# Patient Record
Sex: Male | Born: 1935 | Race: White | Hispanic: No | Marital: Married | Smoking: Never smoker
Health system: Southern US, Community
[De-identification: ages and names within clinical notes are randomized; demographics above are authoritative.]

## PROBLEM LIST (undated history)

## (undated) DIAGNOSIS — I639 Cerebral infarction, unspecified: Secondary | ICD-10-CM

## (undated) DIAGNOSIS — E785 Hyperlipidemia, unspecified: Secondary | ICD-10-CM

## (undated) DIAGNOSIS — Z952 Presence of prosthetic heart valve: Secondary | ICD-10-CM

## (undated) DIAGNOSIS — G20 Parkinson's disease: Secondary | ICD-10-CM

## (undated) DIAGNOSIS — G20A1 Parkinson's disease without dyskinesia, without mention of fluctuations: Secondary | ICD-10-CM

## (undated) HISTORY — PX: CATARACT EXTRACTION: SUR2

## (undated) HISTORY — DX: Hyperlipidemia, unspecified: E78.5

## (undated) HISTORY — DX: Parkinson's disease: G20

## (undated) HISTORY — DX: Cerebral infarction, unspecified: I63.9

## (undated) HISTORY — DX: Presence of prosthetic heart valve: Z95.2

## (undated) HISTORY — DX: Parkinson's disease without dyskinesia, without mention of fluctuations: G20.A1

---

## 2002-03-01 ENCOUNTER — Ambulatory Visit (HOSPITAL_COMMUNITY): Admission: RE | Admit: 2002-03-01 | Discharge: 2002-03-01 | Payer: Self-pay | Admitting: Gastroenterology

## 2007-03-26 ENCOUNTER — Inpatient Hospital Stay (HOSPITAL_COMMUNITY): Admission: EM | Admit: 2007-03-26 | Discharge: 2007-04-03 | Payer: Self-pay | Admitting: Emergency Medicine

## 2007-03-27 ENCOUNTER — Encounter (INDEPENDENT_AMBULATORY_CARE_PROVIDER_SITE_OTHER): Payer: Self-pay | Admitting: Cardiology

## 2007-03-28 ENCOUNTER — Ambulatory Visit: Payer: Self-pay | Admitting: Cardiothoracic Surgery

## 2007-03-28 ENCOUNTER — Encounter: Payer: Self-pay | Admitting: Vascular Surgery

## 2007-03-29 ENCOUNTER — Encounter (INDEPENDENT_AMBULATORY_CARE_PROVIDER_SITE_OTHER): Payer: Self-pay | Admitting: Cardiology

## 2007-04-10 ENCOUNTER — Encounter: Payer: Self-pay | Admitting: Cardiothoracic Surgery

## 2007-04-10 ENCOUNTER — Inpatient Hospital Stay (HOSPITAL_COMMUNITY): Admission: RE | Admit: 2007-04-10 | Discharge: 2007-04-16 | Payer: Self-pay | Admitting: Cardiothoracic Surgery

## 2007-04-10 HISTORY — PX: MITRAL VALVE ANNULOPLASTY: SHX2038

## 2007-05-11 ENCOUNTER — Ambulatory Visit: Payer: Self-pay | Admitting: Cardiothoracic Surgery

## 2007-05-11 ENCOUNTER — Encounter: Admission: RE | Admit: 2007-05-11 | Discharge: 2007-05-11 | Payer: Self-pay | Admitting: Cardiothoracic Surgery

## 2007-05-12 ENCOUNTER — Ambulatory Visit: Payer: Self-pay | Admitting: Cardiothoracic Surgery

## 2007-05-18 ENCOUNTER — Encounter (HOSPITAL_COMMUNITY): Admission: RE | Admit: 2007-05-18 | Discharge: 2007-08-16 | Payer: Self-pay | Admitting: Cardiology

## 2007-08-03 ENCOUNTER — Ambulatory Visit: Payer: Self-pay | Admitting: Cardiothoracic Surgery

## 2007-08-17 ENCOUNTER — Encounter (HOSPITAL_COMMUNITY): Admission: RE | Admit: 2007-08-17 | Discharge: 2007-11-15 | Payer: Self-pay | Admitting: Cardiology

## 2011-04-06 NOTE — Discharge Summary (Signed)
NAME:  [name] [name], [name] [name] NO.:  [id number]   MEDICAL RECORD NO.:  [id number]          PATIENT TYPE:  INP   LOCATION:  2035                         FACILITY:  [facility]   PHYSICIAN:  Nicki Guadalajara, M.D.     DATE OF BIRTH:  Feb 27, 1932   DATE OF ADMISSION:  03/26/2007  DATE OF DISCHARGE:  04/03/2007                               DISCHARGE SUMMARY   DISCHARGE DIAGNOSES:  1. Severe mitral regurgitation.  2. Mitral valve prolapse.  3. Parkinson's.  4. History of endocarditis.  5. Hypertension, controlled.  6. Hypotension with medications.  7. Moderate pulmonary hypertension.  8. No significant coronary artery disease.  There is marked ectasia      noted in the right coronary artery.  9. Needs 14 days off MAO inhibitor, Azilect, prior to mitral valve      replacement surgery.   DISCHARGE CONDITION:  Stable.   PROCEDURES:  1. Mar 27, 2007, combined left heart catheterization by Cristy Hilts. Jacinto Halim,      MD, and right heart catheterization.  See dictated report.  2. Mar 29, 2007, TEE done by Dr. Yates Decamp, with severe mitral valve      prolapse, PML tents to 7 mm beyond the annulus, involving severe      MVP involving P1, P2 and P3 segments, flail P2 segment, severe MR.   DISCHARGE MEDICATIONS:  1. Amiodarone 200 mg twice a day.  2. Requip 2 mg at bedtime and 3 mg at 7, 12 and 7 p.m.  3. Aspirin 81 mg daily.  4. Sinemet 25/100 mg one at 7 a.m. and at noon.  5. Lipitor has been held due to abnormal LFTs.  6. Stop taking Azilect, Provigil and Celebrex as well as Lipitor.  7. The patient was attempted to be given metoprolol and lisinopril      during hospitalization but due to hypotension with blood pressures      in the 80s and heart rates in the 40s, these were both stopped.   DISCHARGE INSTRUCTIONS:  1. Increase activity slowly only to what you were doing in the      hospital.  You may walk up steps with assistance.  You may shower      and bathe.  No lifting.  No driving.   Do not do any more activity      at home than you have done in the hospital.  2. Wash right groin catheterization site with soap and water.  Call if      any bleeding, swelling or drainage.  3. Follow up with Dr. Jacinto Halim Apr 07, 2007, at 12 noon.  4. The patient was instructed that surgery was tentatively planned on      the 19th, 20th or 21st.  I have called Dr. Dennie Maizes office and      asked them to contact the patient at home for further instructions.   HISTORY OF PRESENT ILLNESS:  A 77 year old white married male without  previous history of coronary disease, who presented to the emergency  room at [facility] with complaints of chest burning like  indigestion sensation  in the middle of the chest.  He also felt diaphoretic.  The symptoms  continued on and off since March 22, 2007, especially with exertion.  On  the day of admission, Mar 26, 2007, he felt progressively worse and  decided to go to the ER for assessment, and he was seen and evaluated  and admitted to rule out MI.  A CT of the chest was done, concerned for  a history of endocarditis.   PAST MEDICAL HISTORY:  1. Parkinson disease.  2. Cervical spine osteoarthritis.  3. Bacterial endocarditis.  4. Hypotension.  5. Hyperlipidemia.   FAMILY HISTORY:  No significant direct family history of coronary  disease.   SOCIAL HISTORY:  Married.  Retired Clinical research associate.  No tobacco, occasional beer.  Physically active.   OUTPATIENT MEDICATIONS:  1. Requip 2 mg b.i.d., Requip 3 mg t.i.d.  2. Lipitor 20 mg daily.  3. Provigil 200 mg daily and p.r.n.  4. Azilect 1 mg daily.  5. Sinemet 25/100 mg b.i.d.  6. Celebrex 200 mg daily p.r.n.   ALLERGIES:  No known allergies.   REVIEW OF SYSTEMS:  See HPI.   PHYSICAL EXAMINATION AT DISCHARGE:  Blood pressure 112/72, pulse 60,  respiratory rate 20, temperature 97.6.  Heart:  No change in murmur.  Chest clear.  Right groin was stable.   LABORATORY DATA:  Hemoglobin 13.1, hematocrit 38.6, WBC  7.8, platelets  142.  Neutrophils 75, lymphs 15, monos 7, eos 1, baso 0.  D-dimer on  admission 2.17.  Pro time 16.1, INR 1.3.  heparin was started at 0.311.  Chemistry:  Sodium 135, potassium 3.8, chloride 105, CO2 25, glucose  103, BUN 22, creatinine 1.16, glucose was slightly elevated on day 5-  1/2.  Total protein 5.8, albumin 2.9, AST 56, ALT 62, ALP 91, total  bilirubin 0.7.  Magnesium 2.4.  CK-MB:  CKs were 52, MBs 3.3, troponin I  0.60, 0.41.  The last CK was 122, MB was 3.7, troponin I 0.20.  Total  cholesterol 129, triglycerides 28, HDL 44, and LDL 79.  TSH 3.806.  Blood culture was done:  No growth to date.   CT of the chest:  No CT evidence for pulmonary emboli.  Cardiac  enlargement with bilateral pleural effusions.  Some patchy mild edema  and bilateral atelectasis.  Enlarged mediastinal and hilar lymph nodes  may be inflammatory.  There are a few small pulmonary nodules in the  lower lung zones.  Recommend follow-up noncontrast chest CT in 6 months  to reevaluate.  Normal thoracic aorta.  No significant upper abdominal  findings.  EKG:  Sinus rhythm, sinus arrhythmia, and no acute changes.   HOSPITAL COURSE:  The patient was admitted by Dr. Tresa Endo and admitted for  rule-out MI.  Troponin I was mildly elevated.  The patient underwent  cardiac catheterization, which revealed significant coronary disease.  There was marked ectasia of the right coronary artery.  He was found to  have significant mitral regurgitation, mitral valve prolapse and  recommendation for mitral valve replacement was given.  He did undergo  TEE with results as stated.  He continued to improve and to be stable.  Amiodarone was started prophylactically prior to surgery.  We decreased  his Lopressor initially with that and his heart rate continued to drop  down at times to 44.  His Lopressor eventually had to be held and his pressure dropped to the 80s.  His lisinopril was also held, which had  been  started on admission.  With anesthesia evaluation, it was felt he  needed to come off the Azilect for 14 days prior to anesthesia.  At one  point the patient's oxygen level was somewhat low but at the time of  discharge, room air oxygen saturation with ambulation was 94% and at  rest it was 96%.  He was stable and ready for discharge.  He would  follow up with Dr. Jacinto Halim as well as follow the instructions from [facility].      Darcella Gasman. Annie Paras, N.P.    ______________________________  Nicki Guadalajara, M.D.    LRI/MEDQ  D:  04/03/2007  T:  04/03/2007  Job:  [id number]   cc:   Sheliah Plane, MD  Cristy Hilts Jacinto Halim, MD  Alfonse Alpers Dagoberto Ligas, M.D.  Dr. Annabell Sabal

## 2011-04-06 NOTE — Op Note (Signed)
NAME:  [name], [name] NO.:  [id number]   MEDICAL RECORD NO.:  [id number]          PATIENT TYPE:  INP   LOCATION:  2312                         FACILITY:  [facility]   PHYSICIAN:  Guadalupe Maple, M.D.  DATE OF BIRTH:  [birth year]   DATE OF PROCEDURE:  04/10/2007  DATE OF DISCHARGE:                               OPERATIVE REPORT   PROCEDURE:  Intraoperative transesophageal echocardiography.   HISTORY OF PRESENT ILLNESS:  [name] is a 77 year old white male  with a history of severe mitral regurgitation who was scheduled to  undergo mitral valve repair by Dr. Tyrone Sage.  Intraoperative  transesophageal echocardiography was requested to evaluate the mitral  valve and to determine if any other valvular pathology was present, and  to assess the left ventricular function.   The patient was brought to the operating room at [facility] and  general anesthesia was induced without difficulty.  The tracheal was  intubated without difficulty.   IMPRESSION:  Pre-bypass findings:  1. Mitral valve:  There was thickening and prolapse of the posterior      leaflet of the mitral valve with torn chordae.  This appeared to      involve the P2 and part of the P1 area of the mitral valve.  There      appeared to be no myxomatous degeneration or torn chordae involving      the anterior leaflet.  There was a jet of mitral regurgitation      which appeared to originate in the P2 and part of the P1 region.      The jet was directed centrally and anteriorly.  The regurgitant      orifice area by the PISA method measured 0.52 cm squared.      Regurgitant volume was 94 mL.  2. Aortic valve:  Aortic vale was trileaflet.  The leaflets open      normally.  There was no calcification of the valve leaflets      appreciated and there was trace aortic insufficiency.  3. Left ventricle:  The left ventricular cavity was dilated.  The left      ventricular diameter at end diastole of the  mid-papillary level was      6.65 cm and end systole 4.13 cm.  Left ventricular wall thickness      measured 1.05 cm at the mid-papillary level at end diastole of the      interatrial septum.  There was no thrombus noted in the left      ventricular apex.  4. Right ventricle:  The right ventricular size appeared to be within      normal limits with good contractility of the right ventricular free      wall.  5. Tricuspid valve:  The tricuspid valve was trileaflet with trace to      1+ tricuspid insufficiency.  6. Interatrial septum:  There was no evidence of a patent foreman      ovale or atrial septal defect by color Doppler and by bubble study.  7. Left atrium:  The  left atrial cavity was enlarged.  There was no      thrombus noted in the left atrium or the left atrial appendage.  8. Ascending aorta:  The ascending aorta showed no evidence of      significant atheromatous disease appreciated.  The ascending aorta      was not aneurysmal.   Post-bypass findings:  1. Mitral valve:  There was evidence of a mitral valvuloplasty done.      There was a valvuloplasty ring in place with a characteristic      trapdoor appearance of the mitral valve with the anterior leaflet      mobile and the posterior leaflet largely immobile.  There was trace      mitral insufficiency residually.  The mitral valve mean gradient by      continuous wave Doppler measured 2.8 mmHg.  2. Aortic valve:  The aortic valve was unchanged from the pre-valvular      study.  It was trileaflet, opening normally with trace aortic      insufficiency.  3. Left ventricle:  The left ventricular cavity was smaller in size      than the pre-bypass study.  Initially with ventricular pacing,      there was dyssynchronous motion of the left ventricular      contractility, but with atrial pacing this largely resolved and      there was good contractibility of the left ventricle with ejection      fraction estimated at 55 to  60%.  4. Right ventricle:  The right ventricular size again appeared to be      within normal limits with good contractility of the right      ventricular free wall.  5. Tricuspid valve:  There was residual trace tricuspid insufficiency.           ______________________________  Guadalupe Maple, M.D.     DCJ/MEDQ  D:  04/10/2007  T:  04/10/2007  Job:  [id number]

## 2011-04-06 NOTE — Discharge Summary (Signed)
NAME:  [name] [name], [name] [name] NO.:  [id number]   MEDICAL RECORD NO.:  [id number]          PATIENT TYPE:  INP   LOCATION:  2031                         FACILITY:  [facility]   PHYSICIAN:  Sheliah Plane, MD    DATE OF BIRTH:  1932-10-26   DATE OF ADMISSION:  04/10/2007  DATE OF DISCHARGE:  04/16/2007                               DISCHARGE SUMMARY   PRIMARY ADMITTING DIAGNOSIS:  Severe mitral regurgitation.   ADDITIONAL/DISCHARGE DIAGNOSES:  1. Severe mitral regurgitation.  2. History of congestive heart failure.  3. Hyperlipidemia.  4. History of Parkinson disease.  5. Patent foramen ovale.  6. History of mitral valve endocarditis around 10 years ago.  7. Postoperative bradycardia and hypotension.   PROCEDURES PERFORMED:  1. Mitral valve repair with 26-mm Edwards physio ring.  2. Quadrangular resection of the middle scallop of the posterior      leaflet of the mitral valve.  3. Placement two Gore-Tex chordae tendineae.  4. Closure, left atrial appendage.  5. Closure of patent foramen ovale.   HISTORY:  The patient is a 77 year old male with a history of mitral  valve endocarditis which had been treated approximately 10 years ago.  He presented recently with a 1-week history of increased shortness of  breath, fatigue, weakness and chest tightness.  He was admitted to the  cardiology service and subsequently underwent a transesophageal  echocardiogram on Mar 27, 2007, which showed moderately to markedly  dilated left ventricle with normal left ventricular systolic function  and ejection fraction of approximately 50%.  The mitral valve had  moderate myxomatous degeneration involving the posterior leaflet with a  flail holosystolic mitral valve prolapse involving the posterior leaflet  with severe mitral regurgitation.  Cardiac catheterization showed severe  mitral regurgitation with an ejection fraction of 55-60% with a severely  ectatic right coronary artery and no  significant coronary artery  disease.  At the time of his cardiac catheterization a cardiac surgery  consultation was obtained and the patient was seen by Dr. Sheliah Plane for consideration of surgical revascularization.  After review  of his films Dr. Tyrone Sage agreed that his best course of action would be  to proceed with mitral valve repair or replacement at this time.  It was  felt that he could be discharged home and return as an outpatient for  admission to proceed with surgery.  Also, the patient had been on an MAO  inhibitor and this was discontinued and surgery was delayed in order to  allow washout of the medication prior to proceeding with anesthesia.   HOSPITAL COURSE:  He was admitted to [facility] on Apr 10, 2007, and underwent mitral valve repair as described in detail above.  He tolerated the procedure well and was transferred to the SICU in  stable condition.  He was able to be extubated shortly after surgery.  He was hemodynamically stable and doing well on postop day #1.  At that  time his chest tubes and hemodynamic monitoring devices were removed.  He was kept in the unit for further observation.  By postop day #2 he  was stable and ready for transfer to the floor.  He has been started on  Coumadin for anticoagulation for his mitral valve repair.  He has had  some problems postoperatively with bradycardia and initially required  atrial pacing.  At the present time he is maintaining normal sinus  rhythm with heart rates in the 55-70 range.  He also had some problems  with hypotension, which apparently had been a problem prior to  admission.  He has been started on a low-dose beta blocker and a low-  dose ACE inhibitor and appears to be tolerating this well presently.  A  physical therapy consult has been obtained to assist the patient with  mobilization secondary to his history of Parkinson disease.  He is doing  well with ambulation and is actually able  to walk independently and work  with cardiac rehab phase 1.  He continues to progress well.  He is being  diuresed for a mild postoperative volume overload.  He has remained  afebrile and all vital signs are stable.  His INR presently is 1.3.  His  incisions are all healing well.  His most recent labs showed a  hemoglobin of 12.3, hematocrit 36.2, platelets 116, white count 11.7.  Sodium 134, potassium 3.7, BUN 17, creatinine 1.08.  It is felt that if  he continues to remain stable and his INR continues to trend upward, he  will hopefully be ready for discharge home in the next 48-72 hours.   DISCHARGE MEDICATIONS:  1. Coumadin.  Home dose will be determined by PT and INR drawn on the      day of discharge.  2. Amiodarone 200 mg daily.  3. Toprol XL 25 mg daily.  4. Altace 2.5 mg daily.  5. Tylox one to two q.4h. p.r.n. for pain.  6. Lipitor 20 mg daily.  7. Requip 3 mg t.i.d. and 2 mg q.h.s.  8. Sinemet 25/100 mg p.o. b.i.d.  9. A decision will be made at the time of discharge regarding timing      of the restart of his MAOI, Azilect 1 mg daily.   DISCHARGE INSTRUCTIONS:  He is asked to refrain from driving, heavy  lifting or strenuous activity.  He may continue ambulating daily and  using his incentive spirometer.  He may shower daily and clean his  incisions with soap and water.  He will continue a low-fat, low-sodium  diet.   DISCHARGE FOLLOWUP:  He will see Dr. Jacinto Halim back in the office in 2  weeks.  He will then follow up with Dr. Tyrone Sage in 3 weeks and will  have an x-ray at [facility] 1 hour prior to this appointment.  He will need to have a PT and INR drawn within 48 hours of discharge at  [facility].  In the interim if he  experiences any problems or has questions, he is asked to contact our  office immediately.      Coral Ceo, P.A.      Sheliah Plane, MD  Electronically Signed    GC/MEDQ  D:  04/14/2007  T:   04/14/2007  Job:  [id number]   cc:   Cristy Hilts. Jacinto Halim, MD  Alfonse Alpers Dagoberto Ligas, M.D.

## 2011-04-06 NOTE — Consult Note (Signed)
NAME:  [name] [name], [name] [name] NO.:  [id number]   MEDICAL RECORD NO.:  [id number]          PATIENT TYPE:  INP   LOCATION:  2035                         FACILITY:  [facility]   PHYSICIAN:  Sheliah Plane, MD    DATE OF BIRTH:  Jun 17, 1932   DATE OF CONSULTATION:  03/28/2007  DATE OF DISCHARGE:                                 CONSULTATION   CARDIAC SURGERY CONSULTATION:  Requesting physician:  Cristy Hilts. Jacinto Halim, MD  Follow-up cardiologist:  Cristy Hilts. Jacinto Halim, MD  Primary care physician:  Alfonse Alpers. Dagoberto Ligas, MD  Neurologist:  Dr. Annabell Sabal at [facility].   REASON FOR CONSULTATION:  Congestive heart failure with severe mitral  regurgitation.   HISTORY OF PRESENT ILLNESS:  The patient is a 77 year old male who has  had a previous history more than 10 years ago of treated endocarditis  and long-standing murmur, who presents with an approximately 1-week  history of increasing shortness of breath, increasing fatigue and  weakness and chest tightness.  He denies any pedal edema.  The patient  first noted symptoms last week after vacationing at the beach when he  had increasing difficulty walking on the beach and increasing shortness  of breath.  He denies any history of rheumatic fever and, as noted, has  had a long-term history of murmur.  He has had no previous history of  myocardial infarction, cardiac catheterization or angioplasty.   CARDIAC RISK FACTORS:  The patient denies hypertension.  He does have  treated hyperlipidemia.  Denies diabetes.  Denies smoking.  Family  history is significant for the father, who died of lymphoma and prostate  cancer at age 9.  His mother died in her 65s of myocardial infarction.  He has one brother who has had a history of endocarditis.  He denies  claudication.  Has no history of renal insufficiency.   PAST MEDICAL HISTORY:  1. Parkinson's first diagnosed in November 2004.  2. History of endocarditis, specifics are unknown.   PAST  SURGICAL HISTORY:  1. Cataract surgery in August 2007.  2. Fracture of his right tibia treated nonoperatively.  3. Power saw injury in the distant past to the left hand.   SOCIAL HISTORY:  The patient is married, a retired P[employer] consultant.  Has  occasional alcohol use.   CURRENT MEDICATIONS:  1. Amiodarone 400 mg p.o. b.i.d.  This has been recently started.  2. Aspirin 325 mg once a day.  3. Lovenox 40 mg once a day.  4. Lopressor 12.5 mg b.i.d.  5. Azilect 1 mg a day.  6. Requip 3 mg q.a.m., 3 mg at noon, 3 mg at 7 p.m., 2 mg q.h.s.  7. Generic form of Sinemet Extended Release 25/100 mg one tablet twice      a day at 7 a.m. and noon.   DRUG ALLERGIES:  DEMEROL because of drug interaction with his other  medication.   REVIEW OF SYSTEMS:  CARDIAC:  significant for chest tightness,  exertional shortness of breath.  Denies lower extremity edema.  Denies  palpitations.  Denies syncope, orthopnea or presyncope.  GENERAL:  The  patient denies any fever or chills or night sweats.  The patient has had  over a 10-pound weight gain over the past 2 weeks.  He is now up to 200.  He notes that his usual weight is 180.  RESPIRATORY:  No history of  hemoptysis.  Shortness of breath as noted above.  GASTROINTESTINAL:  No  history of GI bleed.  NEUROLOGIC:  As noted above, has a history of  Parkinson's.  GENITOURINARY:  History of urinary frequency, nocturia x4  per night.  Denies polyuria or polydipsia.  PSYCHIATRIC:  Denies  psychiatric history.   PHYSICAL EXAMINATION:  VITAL SIGNS:  Blood pressure is 111/70, heart  rate 66, respiratory rate 19, temperature 97.6 on room air.  O2  saturation is 95%.  The patient is 5 feet 10 inches tall.  Weight is 200  pounds.  GENERAL:  The patient has typical appearance of significant Parkinson's,  a very flat affect and moves slowly, rising from the bed and ambulating  in the room.  NECK:  He has no carotid bruits, no jugular venous distention.  CARDIAC:  He  has a 3/6 systolic murmur, high-pitched, radiating to the  left axilla, consistent with mitral regurgitation.  ABDOMEN:  Benign without palpable masses.  There is no liver tenderness  appreciated.  The aorta is not palpably enlarged.  EXTREMITIES:  Lower extremities are without significant edema.  He has  +1 DP and PT pulses bilaterally.   Transthoracic echocardiogram was done Mar 27, 2007.  The left ventricle  is moderately to markedly dilated with normal left ventricular systolic  function and ejection fraction of approximately 50%.  A trileaflet  aortic valve.  The mitral valve had moderate myxomatous degeneration  involving the posterior leaflet.  There is a flail holosystolic mitral  valve prolapse involving the posterior leaflet with severe mitral  regurgitation.  Left ventricular end-diastolic dimension is 72 mm, end-  systolic dimension 52.  Cardiac catheterization was also performed.  PA  pressures were 56/27, mean of 37, with a significant V wave.  There was  no gradient across the aortic valve.  Ejection fraction was 55-60%.  Severe mitral regurgitation.  The right coronary artery is a large-  caliber vessel, dominant but severely ectatic right coronary artery.   LABORATORY DATA:  Hematocrit is 37.6, hemoglobin 12.6, platelet count  140,000.  Sodium 136, potassium 3.9, chloride 106, CO2 27, BUN 15,  creatinine 1.3.  Troponin peaked at 0.6.  CK was 152 with an MB of 3.7.  On initial presentation his BNP was elevated to over 1000 and with  diuresis has dropped to 856.   IMPRESSION:  The patient is a 77 year old male who appears older than  his stated age and is significantly limited by Parkinson disease, who  presents with dilated cardiomyopathy with preserved systolic function,  pulmonary hypertension, severe mitral regurgitation, and ectatic  coronary arteries but with no high-grade stenosis, and a previous history greater than 10 years ago of endocarditis.  With the  patient's  severe mitral regurgitation, mitral valve repair or replacement would be  recommended, but we will require intensive postoperative physical  therapy to return the patient to a functional status.  The risks and  options were discussed with the patient and his wife in detail, and they  are willing to proceed with surgery.  Prior to surgery I would like to  obtain blood cultures to ensure that  they are negative.  Also a TEE is  to be performed to further evaluate  the valve.  Some improvement in his overall heart failure status prior  to surgery would be of benefit with diuresis and also evaluation by  physical therapy as he will need extensive physical therapy  postoperatively to get ambulating again.      Sheliah Plane, MD  Electronically Signed     EG/MEDQ  D:  03/29/2007  T:  03/29/2007  Job:  [id number]   cc:   Alfonse Alpers. Dagoberto Ligas, M.D.  Cristy Hilts. Jacinto Halim, MD

## 2011-04-06 NOTE — Cardiovascular Report (Signed)
NAME:  [name] [name] NO.:  [id number]   MEDICAL RECORD NO.:  [id number]          PATIENT TYPE:  INP   LOCATION:  2035                         FACILITY:  [facility]   PHYSICIAN:  Cristy Hilts. Jacinto Halim, MD       DATE OF BIRTH:  1932-01-19   DATE OF PROCEDURE:  03/27/2007  DATE OF DISCHARGE:                            CARDIAC CATHETERIZATION   PROCEDURE PERFORMED:  1. Left and right heart catheterization.  2. Selective left coronary angiography.  3. Abdominal aortogram.   INDICATIONS:  [name] is a 77 year old gentleman who was admitted  to the hospital with chest pain.  He had markedly abnormal heart sounds  suggestive of severe mitral regurgitation.  He had a transthoracic  echocardiogram which again confirmed severe mitral regurgitation.  There  was a questionable flail mitral leaflet versus perforated and  fenestrated mitral valve from prior endocarditis.  He is now brought to  the cardiac catheterization lab to evaluate his coronary anatomy with  the eye towards surgical mitral valve repair given markedly abnormal  echocardiogram.   Abdominal aortogram was performed to evaluate for abdominal  atherosclerosis.   RIGHT HEART CATHETERIZATION HEMODYNAMIC DATA:  RA pressures were 18/17  with a mean of 16 mmHg.  RV pressures were 52/30 with end diastolic  pressure of 16 mmHg.  PA pressures were 56/27 with a mean of 37 mmHg.  Pulmonary capillary wedge pressure was 5/39 with a mean of 25 mmHg.  There was giant V-waves noted.  The PA saturation was 53% and aortic  saturation was 95% on room air.  Cardiac output was 3.1 with a cardiac  index of 1.5 by Fick.   LEFT HEART CATHETERIZATION HEMODYNAMIC DATA:  Ventricular pressures were  100/30 with end diastolic pressure of 24 mmHg.  The aortic pressure  92/56 with a mean of 69 mmHg.  There was no pressure gradient across the  aortic valve.   ANGIOGRAPHIC DATA:  Left ventricle.  The left ventricular systolic  function was  preserved with ejection fraction of 55% to 60%.  There was  moderate to marked left ventricular enlargement.  There was severe  mitral regurgitation.  There is marked left atrial enlargement.  Right  coronary artery.  Right coronary artery is a large-caliber vessel and a  dominant vessel.  It is severely ectatic.  It is a large-caliber vessel.  Gives origin to two PDA's and a large PLA.  The mid-to-distal segment of  the right coronary artery is smooth and normal.  Left main.  The Left  main is a large vessel.  Smooth and normal.  Circumflex.  The circumflex  is a large vessel.  It has mild luminal irregularity.  LAD.  The LAD is  a large caliber vessel.  In gives origin to several small diagonals.  It  has got mild luminal irregularity.   Abdominal aortogram.  The abd aortogram revealed presence of two renal  arteries, one on either side.  They are widely patent.  There is mild  tortuosity of the abdominal aorta.   IMPRESSION:  1. Moderate pulmonary  hypertension.  2. Severe mitral regurgitation.  3. Moderate to marked left ventricular enlargement with preserved left      ventricle systolic function.  4. No significant critical coronary artery disease.  However there is      marked ectasia noted in the right coronary artery.   RECOMMENDATIONS:  The patient will need either mitral valve repair  and/or replacement as an inpatient.  Given severe mitral regurgitation  although he presented with chest pain and is ruled out for myocardial  infarction, I am recommending that he have inpatient mitral valve repair  given the fact that he has severe wide open mitral regurgitation.  He  also needs a TEE to better evaluate the mitral valve structure.   I discussed these findings with Dr. Andrey Spearman.   A total of 100 mL of contrast was utilized for diagnostic angiography.   TECHNIQUE OF THE PROCEDURE:  Under sterile precautions using a 7-French  right femoral venous and a 6-French right  femoral artery access, right  and left heart catheterization was performed.   Right heart catheterization was performed using a balloon-tip Swan-Ganz  catheter which was advanced easily into the pulmonary artery but because  of inability to advanced into the wedge position a 0.025 inch J-wire was  utilized for support and pulmonary capillary wedge was obtained.  Hemodynamics were performed and the waveforms were carefully analyzed.  Then the catheter was pulled out of the body after obtaining PA  saturation.   TECHNIQUE OF LEFT HEART CATHETERIZATION:  Using a 6-French multipurpose  B2 catheter which was advanced to the left ventricle, the left  ventriculography was performed in the RAO projection.  Then the catheter  was pulled back into the ascending aorta and right coronary artery  selectively obtained and angiography was performed.  Then the left main  coronary artery was selectively obtained and angiography was performed.  Then the catheter was pulled back in the abdominal aorta and abdominal  aortogram was performed.  The catheter was then pulled out of body in  usual fashion.  Right femoral angiography was performed through arterial  access site and the access closed with Star close with excellent  hemostasis.  Venous hemostasis was obtained by manual compression.  The  patient tolerated the procedure.  No immediate complications noted.      Cristy Hilts. Jacinto Halim, MD  Electronically Signed     JRG/MEDQ  D:  03/27/2007  T:  03/28/2007  Job:  [id number]   cc:   Nicki Guadalajara, M.D.

## 2011-04-06 NOTE — Assessment & Plan Note (Signed)
OFFICE VISIT   [name] [name], [name] [name]  DOB:  October 18, 1932                                        May 12, 2007  CHART #:  [id number]   [name] [name] returns to the office today after follow up from his recent  mitral valve repair with quadrangle resection and closure of left atrial  appendage in patent framing and ring annuloplasty done on 04/10/2007.  Consider the patient's moderate limitations with his Parkinson's  disease, he has made very progress postoperatively. He has been  ambulating reasonably well at home. He has had no symptoms of congestive  heart failure and notes that his Coumadin has been checked by Dr.  Verl Dicker office, and the last one was an INR of 1.5, and his Coumadin has  been checked and has been increased. It needs to be checked again early  next week. Today, the patient noted that he had planned on using a  powered mower to cut his grass. Wisely, his wife did not let him do  this, but overall the patient feels well enough to be engaged in such  activities.   PHYSICAL EXAMINATION:  VITAL SIGNS:  Blood pressure slightly low at  98/57, asymptomatic. His pulse is 60 and regular. Respiratory 18, O2  saturation 96%.  GENERAL:  Overall, the patient looks very well.  LUNGS:  Clear.  CARDIAC:  He has no murmur of mitral insufficiency.  EXTREMITIES:  He has no pedal edema.  CHEST:  His sternum is stable and well healed.   Follow up chest x-ray shows clear lung fields bilaterally.   Overall, the patient appears to be doing very well postoperatively. His  current medications were reviewed. He had been on Diovan 40 mg once  daily and Toprol extended release half of a tablet once daily, as well  as Amiodarone 100 mg daily, Coumadin, also on Requip, Azelaic, and  Lipitor. I have written the patient a new prescription for Diovan 40 mg  tablets to take half a tablet each day because of his complaint of low  blood pressure and fatigue. I have instructed his wife to  save the 80 mg  tablets as he progresses postoperatively, his pressure may rise and he  will need an increased dose. I have encouraged him to enroll into the  cardiac rehabilitation program in the next week to 10 days. Overall, I  am very pleased with his progress, especially in light of his  significant Parkinson's disease. If he remains in sinus rhythm, at 8  weeks postoperatively we could consider stopping the Coumadin and  continuing on an aspirin a day. I do plan to see him back in 3 months.   [name] Plane, MD  Electronically Signed   EG/MEDQ  D:  05/12/2007  T:  05/13/2007  Job:  [id number]   cc:   Cristy Hilts. Jacinto Halim, MD

## 2011-04-06 NOTE — Discharge Summary (Signed)
NAME:  [name] [name], [name] [name] NO.:  [id number]   MEDICAL RECORD NO.:  [id number]          PATIENT TYPE:  INP   LOCATION:  2031                         FACILITY:  [facility]   PHYSICIAN:  Sheliah Plane, MD    DATE OF BIRTH:  28-Apr-1932   DATE OF ADMISSION:  04/10/2007  DATE OF DISCHARGE:  04/16/2007                               DISCHARGE SUMMARY   ADDENDUM:  This is an addendum to a previously dictated discharge summary.  Mr.  [name] [name] was originally scheduled for discharge home on approximately February 13, 2007.  However, at that time, his INR was subtherapeutic.  He  remained in the hospital for further monitoring of his anticoagulation.  Within the next 24 hours, his INR had reached to 2.8 with a PT of 31.4.  He was, otherwise, doing well and had had no acute changes from the  previously dictated discharge summary.  He had also had no new labs  since the previously dictated discharge summary.  All-in-all, he was  doing well.  His incisions were healing nicely.  He was maintaining  normal sinus rhythm, although somewhat bradycardic with heart rates in  the 50s to 60s which had been stable throughout his admission.  He was  seen and evaluated that morning by Dr. Tyrone Sage and was felt ready for  discharge home.   DISCHARGE MEDICATIONS:  Are unchanged from the previously dictated  discharge summary.  His Coumadin dose at the time of discharge was 2.5  mg daily until his blood work was checked.   DISCHARGE INSTRUCTIONS:  Are unchanged from the previously dictated  discharge summary.   DISCHARGE FOLLOWUP:  Unchanged from previously dictated discharge  summary.  He will follow up at [facility] on Apr 18, 2007 for a PT and INR and further management of his anticoagulation.  He will follow up with Dr. Jacinto Halim and Dr. Tyrone Sage as previously  scheduled.      Coral Ceo, P.A.      Sheliah Plane, MD  Electronically Signed    GC/MEDQ  D:   05/31/2007  T:  06/01/2007  Job:  [id number]   cc:   Alfonse Alpers. Dagoberto Ligas, M.D.  Cristy Hilts. Jacinto Halim, MD

## 2011-04-06 NOTE — Assessment & Plan Note (Signed)
OFFICE VISIT   [name] [name], PASK  DOB:  1932-06-22                                        August 03, 2007  CHART #:  [id number]   BRIEF HISTORY:  The patient returns to the office today in followup  after mitral valve repair with a quadrangular resection and closure of a  left atrial appendage and a patent foramen and ring annuloplasty done on  Apr 10, 2007.  The patient has stabilized medically very nicely.  His  Parkinson's is stable.  He is no longer limited by his physical ability  secondary to cardiac disease.  He is involved in cardiac rehab and walks  on a daily basis without significant shortness of breath.  He denies any  pedal edema.  He has no other symptoms of congestive heart failure.  He  is now off his Coumadin and taking one aspirin daily.  Overall he is  making very good progress.   PHYSICAL EXAMINATION:  Vital signs:  His blood pressure 106/68, pulse is  78 and regular, respiratory rate is 18, and O2 SAT is 96%.  Chest:  His  sternum is stable and well healed.  His lungs are clear.  Extremities:  He has no pedal edema.  Heart:  On examination of his heart, I do not  appreciate any murmur of mitral insufficiency.   Overall I am very pleased with his progress.  He continues to be  followed by Dr. Jacinto Halim.  I have not made him a return appointment to see  me but would see him at Dr. Verl Dicker request.  I have asked him to  forward any copies of echocardiogram reports in the future should he  have them for my records.   Sheliah Plane, MD  Electronically Signed   EG/MEDQ  D:  08/03/2007  T:  08/03/2007  Job:  [id number]   cc:   Cristy Hilts. Jacinto Halim, MD

## 2011-04-06 NOTE — Op Note (Signed)
NAME:  JAKWAN, SALLY NO.:  [id number]   MEDICAL RECORD NO.:  [id number]          PATIENT TYPE:  INP   LOCATION:  2312                         FACILITY:  [facility]   PHYSICIAN:  Sheliah Plane, MD    DATE OF BIRTH:  10-Jul-1932   DATE OF PROCEDURE:  04/10/2007  DATE OF DISCHARGE:                               OPERATIVE REPORT   PREOPERATIVE DIAGNOSIS:  Severe mitral regurgitation.   POSTOPERATIVE DIAGNOSIS:  Severe mitral regurgitation with patent  foramen.   PROCEDURE PERFORMED:  Mitral valve repair with quadrangular resection of  the middle scallop of the posterior leaflets and placement of Gore-Tex  artificial cords, closure of left atrial appendage, closure of patent  foramen.  Also, a ring annuloplasty, [employer] model [id number],  serial [id number], 26 mm.  Placement of artifical vortex.   SURGEON:  Sheliah Plane, M.D.   FIRST ASSISTANT:  Jerold Coombe, P.A.   BRIEF HISTORY:  Patient is a 77 year old male with significant  Parkinson's, who had a history approximately 10 years ago of mitral  endocarditis.  He has been treated medically over the years but recently  began having symptoms of congestive heart failure and was found to have  some dilation of the left ventricle and severe mitral regurgitation.  Cardiac catheterization was performed by Dr. Jacinto Halim that showed some  luminal irregularities and ectasia of the right coronary artery but no  stenoses.  The mitral valve repair/replacement was recommended to the  patient.  The patient has significant Parkinson's disease and had been  on an MAO inhibitor.  This was stopped for approximately two weeks prior  to proceeding with surgery.  Risks and options were discussed with the  patient and his wife in detail, and he was willing to proceed.   DESCRIPTION OF PROCEDURE:  With Swan-Ganz and arterial line monitors in  place, patient underwent general endotracheal anesthesia without  incidence.  The  skin of the chest and legs was prepped with Betadine and  draped in the usual sterile manner.  The pericardium was opened.  TEE  was performed and dictated under a separate note by Dr. __________ , but  there was evidence of severe mitral regurgitation, and a portion of the  posterior leaflet was flail.  The patient was systemically heparinized.  The ascending aorta was cannulated.  Superior and inferior vena cava  cannulas were placed.  The patient was placed on cardiopulmonary bypass  at 4 L/min/m2.  The right retrograde cardioplegia catheter was  introduced through a separate stab wound in the right atrium.  Aortic  root bent cardioplegia needle was introduced.  Patient's body  temperature was cooled at 30 degrees.  Aortic cross clamp was applied,  and 500 cc of cold blood potassium cardioplegia was administered with  rapid diastolic arrest of the heart.  Myocardial septal temperature was  monitored after the cross clamp.  The left atrium was opened in the left  interatrial groove, and with retraction, good exposure of the mitral  valve was obtained.  There is no evidence of clot in the left atrial  appendage.  The left atrial appendage was sutured closed from the inside  of the atrium with a running 4-0 Prolene suture in a double layer.  Attention was then turned to the mitral valve.  The middle scallop was  flail.  This was excised.  A pledgeted suture was placed in the annulus  at the base of this closure for compression.  The leaflet edges were  then reapproximated with 5-0 Ethibond sutures.  There was a small cleft  towards the posterior medial commissure, which was closed in the  posterior leaflet.  This produced, with passive filling, much improved  coaptation.  Still, some elongation of the cordae of the anterior  leaflet appeared to persist to 5-0 Gore-Tex sutures.  Small pledgets,  two in each of the papillary muscles, were attached along the free edge  of the anterior leaflet  in A2 and with a heart filled with cool saline,  were adjusted to what appeared to be the proper height.  The anterior  leaflet and annulus was incised for an annuloplasty ring [employer] model 4450, 26 mm, serial [id number].  Tycron #2 pledgeted  sutures were placed circumferentially around the annulus and used to  secure the ring in place.  With the ring in place and the repairs  completed, on passive filling of the valve, there was no evidence of  regurgitation.  The patient was allowed to rewarm.  The retractors were  removed.  From removing the retractor, there was evidence of a patent  foramen, which was closed with a running 5-0 Prolene suture.  The  enterotomy incision was then closed with a horizontal mattress 3-0  Prolene suture and a second layer of simple suture prior to complete  suture.  The heart was allowed to passively fill and de-air.  The aortic  cross clamp was removed.  Total cross clamp 105 minutes.  The patient  initially had transient heart block but converted to a sinus rhythm.  Atrial and ventricular pacing wires were applied.  The heart was filled  and examined with TEE.  There was no evidence of mitral regurgitation.  With the body temperature rewarmed, the patient was then ventilated and  weaned from cardiopulmonary bypass without difficulty.  The aortic root  vent was used for further de-airing and then removed.  The patient was  decannulated in the usual fashion.  Protamine sulfate was administered.  With operative field hemostatic, two atrial __________  pacing wires had  been applied.  The pericardium was loosely reapproximated.  The sternum  was closed with a #6 stainless steel wire.  The fascia was closed with  interrupted 0 Vicryl and running 3-0 Vicryl in the subcutaneous tissue.  A 4-0 subcuticular stitch in the skin edges.  Dry dressings were  applied.  Sponge and needle count was reported as correct at the completion of the procedure.  The  patient tolerated the procedure  without obvious complication and was transferred to the surgical  intensive care unit for further postoperative care.  Total pump time was  126 minutes.  Patient did not require any blood bank products during the  procedure.      Sheliah Plane, MD  Electronically Signed     EG/MEDQ  D:  04/11/2007  T:  04/11/2007  Job:  [id number]   cc:   Cristy Hilts. Jacinto Halim, MD

## 2012-08-15 HISTORY — PX: TRANSTHORACIC ECHOCARDIOGRAM: SHX275

## 2013-05-01 ENCOUNTER — Encounter: Payer: Self-pay | Admitting: Internal Medicine

## 2013-07-29 ENCOUNTER — Encounter: Payer: Self-pay | Admitting: *Deleted

## 2013-08-01 ENCOUNTER — Ambulatory Visit: Payer: Self-pay | Admitting: Internal Medicine

## 2013-08-06 ENCOUNTER — Ambulatory Visit (INDEPENDENT_AMBULATORY_CARE_PROVIDER_SITE_OTHER): Payer: Medicare Other | Admitting: Internal Medicine

## 2013-08-06 ENCOUNTER — Encounter: Payer: Self-pay | Admitting: Internal Medicine

## 2013-08-06 VITALS — BP 100/70 | HR 72 | Ht 69.0 in | Wt 188.5 lb

## 2013-08-06 DIAGNOSIS — Z9889 Other specified postprocedural states: Secondary | ICD-10-CM

## 2013-08-06 DIAGNOSIS — E785 Hyperlipidemia, unspecified: Secondary | ICD-10-CM

## 2013-08-06 DIAGNOSIS — G20 Parkinson's disease: Secondary | ICD-10-CM

## 2013-08-06 DIAGNOSIS — R011 Cardiac murmur, unspecified: Secondary | ICD-10-CM

## 2013-08-06 NOTE — Patient Instructions (Addendum)
Your physician wants you to follow-up in: 1 year.   You will receive a reminder letter in the mail two months in advance. If you don't receive a letter, please call our office to schedule the follow-up appointment.  Your physician has requested that you have an echocardiogram. Echocardiography is a painless test that uses sound waves to create images of your heart. It provides your doctor with information about the size and shape of your heart and how well your heart's chambers and valves are working. This procedure takes approximately one hour. There are no restrictions for this procedure. ] 

## 2013-08-06 NOTE — Progress Notes (Signed)
OFFICE NOTE  Chief Complaint:  Follow-up  Primary Care Physician: Ezequiel Kayser, MD  HPI:  [name] [name] is a 79 year old gentleman with history of MR and a flail P2 mitral leaflet due to endocarditis in 2008. He had mitral valve repair and a 26-mm Edwards annuloplasty ring. Unfortunately, he has Parkinson disease and has been on medicines recently for that. He had an echo in 2010 which showed trace mitral regurgitation and there was no evidence of any worsening heart murmur. Over the past year, he has done well without worsening shortness of breath, palpitations, presyncope, syncope, or chest pains. Last September he underwent a repeat echocardiogram which showed an EF of greater than 55%. There was mild to moderate mitral regurgitation with an eccentrically directed mitral regurgitant jet. There was also an eccentric intravascular regurgitant jet.  There were mildly increased gradients noted after annuloplasty with a mean gradient of 3.5 mmHg at a heart rate of 77.   PMHx:  Past Medical History  Diagnosis Date  . Parkinson's disease   . S/P mitral valve replacement     mitral regurg, endocarditis  . Dyslipidemia     Past Surgical History  Procedure Laterality Date  . Mitral valve annuloplasty  04/10/2007     26mm Edwards ring; r/t h/o MR and flail mitral leaflets due to endocarditis  . Cataract extraction  2009, 2012    right 2009, left 2012  . Transthoracic echocardiogram  08/15/2012    EF=>55%; normal LV systolic function; RV mildly dilated & systolic function mildly reduced; RA mod dilated; mild -mod MR & mildy increased gradients; mild TR; AV mildly sclerotic with mild-mod regurg    FAMHx:  Family History  Problem Relation Age of Onset  . Stroke Mother     cerebral hemorrahge  . Heart attack Mother   . Hypertension Brother   . Valvular heart disease Brother     also MI  . Prostate cancer Father     SOCHx:   reports that he has never smoked. He has never used smokeless  tobacco. He reports that he does not use illicit drugs. His alcohol history is not on file.  ALLERGIES:  Allergies  Allergen Reactions  . Ace Inhibitors Cough  . Demerol [Meperidine]     ROS: A comprehensive review of systems was negative except for: Neurological: positive for tremors  HOME MEDS: Current Outpatient Prescriptions  Medication Sig Dispense Refill  . amantadine (SYMMETREL) 100 MG capsule Take 100 mg by mouth 2 (two) times daily.      . AMOXICILLIN PO Take 500 mg by mouth. Prior to dental procedures      . aspirin 81 MG tablet Take 81 mg by mouth daily.      Marland Kitchen atorvastatin (LIPITOR) 20 MG tablet Take 20 mg by mouth daily.      . carbidopa-levodopa (SINEMET CR) 50-200 MG per tablet Take 1 tablet by mouth at bedtime.      . carbidopa-levodopa (SINEMET IR) 25-100 MG per tablet Take 2-2.5 tablets by mouth 4 (four) times daily. 2 tabs in AM - 2.5 tabs 3 more times daily      . Cholecalciferol (VITAMIN D PO) Take by mouth daily.      . Cyanocobalamin (VITAMIN B-12 CR PO) Take by mouth daily.      Tery Sanfilippo Calcium (STOOL SOFTENER PO) Take by mouth as needed.      . ibandronate (BONIVA) 150 MG tablet Take 1 tablet by mouth every 30 (thirty) days.      Marland Kitchen  LACTOBACILLUS RHAMNOSUS, GG, PO Take by mouth daily.      . Multiple Vitamins-Minerals (MULTIVITAMIN PO) Take by mouth.      . rasagiline (AZILECT) 1 MG TABS tablet Take 1 mg by mouth daily. Needs to be stopped 14 days prior to surgical procedure.      . rotigotine (NEUPRO) 4 MG/24HR Place 1 patch onto the skin daily.      Marland Kitchen VITAMIN E PO Take by mouth daily.       No current facility-administered medications for this visit.    LABS/IMAGING: No results found for this or any previous visit (from the past 48 hour(s)). No results found.  VITALS: BP 100/70  Pulse 72  Ht 5\' 9"  (1.753 m)  Wt 188 lb 8 oz (85.503 kg)  BMI 27.82 kg/m2  EXAM: General appearance: alert and no distress Neck: no adenopathy, no carotid bruit, no  JVD, supple, symmetrical, trachea midline and thyroid not enlarged, symmetric, no tenderness/mass/nodules Lungs: clear to auscultation bilaterally Heart: regular rate and rhythm and systolic murmur: early systolic 2/6, crescendo at apex Abdomen: soft, non-tender; bowel sounds normal; no masses,  no organomegaly Extremities: extremities normal, atraumatic, no cyanosis or edema Pulses: 2+ and symmetric Skin: Skin color, texture, turgor normal. No rashes or lesions Neurologic: Grossly normal  EKG: Sinus rhythm at 72  ASSESSMENT: 1. Status post mitral valve repair in 2008 with an annuloplasty ring, history of endocarditis 2. Dyslipidemia 3. Parkinson's disease  PLAN: 1.   [name] had changes on his echocardiogram last year indicating possibly some instability in his mitral valve repair. Of course it had been 3 years since that last study. I would like to go ahead and recheck an echocardiogram now to ensure stability of his gradient as well as mitral regurgitation. As far as his murmur is concerned it is not necessarily louder than it had been in the past. He continues to do exercise every day and his main complaints are due to tremors related to his Parkinson's disease. He does not report any chest pain or worsening shortness of breath. We'll continue to see him annually to follow the stability of his valve.  Chrystie Nose, MD, [facility] Attending Cardiologist [facility]  Setareh Rom C 08/06/2013, 11:58 AM

## 2013-08-15 ENCOUNTER — Ambulatory Visit (HOSPITAL_COMMUNITY)
Admission: RE | Admit: 2013-08-15 | Discharge: 2013-08-15 | Disposition: A | Discharge status: Home / Self Care | Payer: Medicare Other | Source: Ambulatory Visit | Attending: Internal Medicine | Admitting: Internal Medicine

## 2013-08-15 DIAGNOSIS — I079 Rheumatic tricuspid valve disease, unspecified: Secondary | ICD-10-CM | POA: Insufficient documentation

## 2013-08-15 DIAGNOSIS — I517 Cardiomegaly: Secondary | ICD-10-CM | POA: Insufficient documentation

## 2013-08-15 DIAGNOSIS — E785 Hyperlipidemia, unspecified: Secondary | ICD-10-CM | POA: Insufficient documentation

## 2013-08-15 DIAGNOSIS — I080 Rheumatic disorders of both mitral and aortic valves: Secondary | ICD-10-CM | POA: Insufficient documentation

## 2013-08-15 DIAGNOSIS — R011 Cardiac murmur, unspecified: Secondary | ICD-10-CM

## 2013-08-15 DIAGNOSIS — Z9889 Other specified postprocedural states: Secondary | ICD-10-CM

## 2013-08-15 NOTE — Progress Notes (Signed)
2D Echo Performed 08/15/2013    Amreen Raczkowski, RCS  

## 2013-09-27 ENCOUNTER — Other Ambulatory Visit: Payer: Self-pay

## 2013-12-19 DIAGNOSIS — F419 Anxiety disorder, unspecified: Secondary | ICD-10-CM | POA: Insufficient documentation

## 2014-12-16 ENCOUNTER — Ambulatory Visit: Payer: Medicare Other | Admitting: Physical Therapy

## 2014-12-17 ENCOUNTER — Encounter: Payer: Self-pay | Admitting: Physical Therapy

## 2014-12-17 ENCOUNTER — Ambulatory Visit: Payer: Medicare Other | Attending: Internal Medicine | Admitting: Physical Therapy

## 2014-12-17 DIAGNOSIS — R269 Unspecified abnormalities of gait and mobility: Secondary | ICD-10-CM | POA: Diagnosis present

## 2014-12-17 DIAGNOSIS — G20 Parkinson's disease: Secondary | ICD-10-CM | POA: Insufficient documentation

## 2014-12-17 DIAGNOSIS — Z952 Presence of prosthetic heart valve: Secondary | ICD-10-CM | POA: Diagnosis not present

## 2014-12-17 DIAGNOSIS — R258 Other abnormal involuntary movements: Secondary | ICD-10-CM | POA: Insufficient documentation

## 2014-12-17 DIAGNOSIS — R293 Abnormal posture: Secondary | ICD-10-CM | POA: Insufficient documentation

## 2014-12-17 DIAGNOSIS — E785 Hyperlipidemia, unspecified: Secondary | ICD-10-CM | POA: Insufficient documentation

## 2014-12-23 ENCOUNTER — Ambulatory Visit: Payer: Medicare Other | Admitting: Physical Therapy

## 2014-12-23 NOTE — Therapy (Signed)
[facility] [address] [place], [place], [address] Phone: [phone]   Fax:  [phone]  Physical Therapy Evaluation  Patient Details  Name: [name] [name] MRN: [id number] Date of Birth: 1932/04/03 Referring Provider:  Ezequiel Kayser, MD  Encounter Date: 12/17/2014      PT End of Session - 12/23/14 1621    Visit Number 1   Number of Visits 17   Date for PT Re-Evaluation 02/16/15   Authorization Type Medicare G-code every 10th visit   PT Start Time 1400   PT Stop Time 1455   PT Time Calculation (min) 55 min   Equipment Utilized During Treatment Gait belt   Activity Tolerance Patient tolerated treatment well      Past Medical History  Diagnosis Date  . Parkinson's disease   . S/P mitral valve replacement     mitral regurg, endocarditis  . Dyslipidemia     Past Surgical History  Procedure Laterality Date  . Mitral valve annuloplasty  04/10/2007     26mm Edwards ring; r/t h/o MR and flail mitral leaflets due to endocarditis  . Cataract extraction  2009, 2012    right 2009, left 2012  . Transthoracic echocardiogram  08/15/2012    EF=>55%; normal LV systolic function; RV mildly dilated & systolic function mildly reduced; RA mod dilated; mild -mod MR & mildy increased gradients; mild TR; AV mildly sclerotic with mild-mod regurg    There were no vitals taken for this visit.  Visit Diagnosis:  Abnormality of gait  Abnormal posture  Bradykinesia        [facility] PT Assessment - 12/23/14 0838    Assessment   Medical Diagnosis Parkinson's disease   Precautions   Precautions Fall  12/16/14:  Avoid excessive walking for 3 week (pl fasciitis)   Balance Screen   Has the patient fallen in the past 6 months Yes   How many times? 4   Has the patient had a decrease in activity level because of a fear of falling?  No   Is the patient reluctant to leave their home because of a fear of falling?  No   Home Environment    Living Enviornment Private residence   Living Arrangements Spouse/significant other   Available Help at Discharge Family   Type of Home House   Home Access Stairs to enter   Entrance Stairs-Number of Steps 1   Entrance Stairs-Rails None   Home Layout Able to live on main level with bedroom/bathroom   Home Equipment Cane - single point;Grab bars - toilet;Grab bars - tub/shower;Shower seat   Prior Function   Level of Independence Independent with basic ADLs;Independent with transfers;Independent with gait  slowed with buttons   Leisure Enjoys walking, has exercise bike (new at his home)   Observation/Other Assessments   Focus on Therapeutic Outcomes (FOTO)  Functional Status intake score 49; Neuro QOL score35.8   Strength   Overall Strength --  grossly tested 4/5 throughout; feels weaker over time   Transfers   Transfers Sit to Stand;Stand to Sit   Sit to Stand From chair/3-in-1;Five times sit to stand;5: Supervision  11.42, with bilateral flexed knees   Ambulation/Gait   Ambulation/Gait Yes   Ambulation/Gait Assistance 5: Supervision   Ambulation/Gait Assistance Details reports increased pain in foot over time   Ambulation Distance (Feet) 120 Feet   Assistive device --  bilateral canes   Gait Pattern Decreased step length - right;Decreased step length - left;Decreased stance time -  right;Decreased stance time - left;Decreased stride length;Decreased dorsiflexion - right;Decreased dorsiflexion - left;Trunk flexed;Narrow base of support;Poor foot clearance - left;Poor foot clearance - right   Ambulation Surface Indoor   Gait velocity 15.87 sec with rollator=2.06 ft/sec  14.90 sec with RW=2.20 ft/sec   Standardized Balance Assessment   Standardized Balance Assessment Timed Up and Go Test   Timed Up and Go Test   Normal TUG (seconds) 21.72  cane; RW 19.66 sec; rollator 19.99 sec                          PT Education - 12/23/14 0845    Education provided Yes    Education Details Educated on safe use of rollator walker, where/how to purchase rollator walker   Person(s) Educated Patient;Spouse   Methods Explanation;Demonstration   Comprehension Verbalized understanding;Need further instruction             PT Long Term Goals - 12/23/14 1628    PT LONG TERM GOAL #1   Title Pt will perform HEP with wife's supervision for improved strength, flexibility, balance, and gait.   Time 4   Period Weeks   Status New   PT LONG TERM GOAL #2   Title Pt will improve Timed Up and Go score to less than or equal to 18 seconds for decreased fall risk.   Time 4   Period Weeks   Status New   PT LONG TERM GOAL #3   Title improve gait velocity to at least 2.62 ft/sec for improved gait efficiency and safety.   Time 4   Period Weeks   Status New   PT LONG TERM GOAL #4   Title perform at least 5 reps of sit<>stand with minimal to no UE support for imrpoved safety and efficiency with transfers.   Time 4   Period Weeks   Status New   PT LONG TERM GOAL #5   Title verbalize plans for continued community fitness upon D/C from PT.   Time 4   Period Weeks   Status New               Plan - 12/23/14 1622    Clinical Impression Statement Pt is an 81 year old male with history of Parkinson's disease with recent orthopedic issues of plantar fascitis/lower extremity pain, which has limited his ambulation.  He presents to PT evaluation using bilateral canes, and per Timed Up and Go score, pt is at risk for falls.  Pt has history of falls and would benefit from skilled PT to address balance and gait and functional lower extremity strength.  Recommend 4-wheeled RW for pt vs rolling walker for imrpoved ease of maneueveribility, with pt needing additional instruction in rollator use and safety.   Pt will benefit from skilled therapeutic intervention in order to improve on the following deficits Abnormal gait;Decreased activity tolerance;Decreased balance;Decreased  mobility;Decreased strength;Difficulty walking;Pain;Postural dysfunction   Rehab Potential Good   PT Frequency 2x / week  Due to financial concerns, pt/wife opt to schedule 1x/week   PT Duration 4 weeks  plus evaluation   PT Treatment/Interventions ADLs/Self Care Home Management;DME Instruction;Therapeutic activities;Functional mobility training;Gait training;Therapeutic exercise;Balance training;Neuromuscular re-education;Patient/family education   PT Next Visit Plan Rollator instruction, reiterating use of brakes, posture, steering and technique to sit; initiate stretching for lower extremities; PWR! moves in sitting and standing for weightshifting; transfers   PT Home Exercise Plan stretching, PWR! Moves in sitting and standing   Consulted and  Agree with Plan of Care Patient;Family member/caregiver   Family Member Consulted wife, Catalina LungerStephanie          G-Codes - 12/23/14 1631    Functional Assessment Tool Used TUG 21.72 seconds; 2.06 ft/sec gait velocity with rollator   Functional Limitation Mobility: Walking and moving around   Mobility: Walking and Moving Around Current Status [phone]([id number]) At least 40 percent but less than 60 percent impaired, limited or restricted   Mobility: Walking and Moving Around Goal Status [phone]([id number]) At least 20 percent but less than 40 percent impaired, limited or restricted       Problem List Patient Active Problem List   Diagnosis Date Noted  . Parkinson's disease 08/06/2013  . Dyslipidemia 08/06/2013  . S/P mitral valve repair 08/06/2013    Hjalmar Ballengee W. 12/23/2014, 4:35 PM for evaluation performed/completed on 12/17/14  Lonia Bloodmy Geanine Vandekamp, PT 12/23/2014 4:35 PM Phone: [phone] Fax: [phone]   [facility] [address] [place], [place], [address] Phone: [phone]   Fax:  [phone]

## 2014-12-24 ENCOUNTER — Encounter: Payer: Self-pay | Admitting: Physical Therapy

## 2014-12-24 ENCOUNTER — Ambulatory Visit: Payer: Medicare Other | Attending: Internal Medicine | Admitting: Physical Therapy

## 2014-12-24 DIAGNOSIS — Z952 Presence of prosthetic heart valve: Secondary | ICD-10-CM | POA: Diagnosis not present

## 2014-12-24 DIAGNOSIS — R258 Other abnormal involuntary movements: Secondary | ICD-10-CM

## 2014-12-24 DIAGNOSIS — G20 Parkinson's disease: Secondary | ICD-10-CM | POA: Insufficient documentation

## 2014-12-24 DIAGNOSIS — R293 Abnormal posture: Secondary | ICD-10-CM | POA: Diagnosis not present

## 2014-12-24 DIAGNOSIS — E785 Hyperlipidemia, unspecified: Secondary | ICD-10-CM | POA: Insufficient documentation

## 2014-12-24 DIAGNOSIS — R269 Unspecified abnormalities of gait and mobility: Secondary | ICD-10-CM | POA: Insufficient documentation

## 2014-12-24 NOTE — Therapy (Signed)
[facility] [address] [place], [place], [address] Phone: [phone]   Fax:  [phone]  Physical Therapy Treatment  Patient Details  Name: [name] HensenJohn [name] MRN: [id number] Date of Birth: 10/02/1932 Referring Provider:  Ezequiel KayserPerini, Mark A, MD  Encounter Date: 12/24/2014      PT End of Session - 12/24/14 1206    Visit Number 2   Number of Visits 17   Date for PT Re-Evaluation 02/16/15   Authorization Type Medicare G-code every 10th visit   PT Start Time 0930   PT Stop Time 1018   PT Time Calculation (min) 48 min   Activity Tolerance Patient tolerated treatment well      Past Medical History  Diagnosis Date  . Parkinson's disease   . S/P mitral valve replacement     mitral regurg, endocarditis  . Dyslipidemia     Past Surgical History  Procedure Laterality Date  . Mitral valve annuloplasty  04/10/2007     26mm Edwards ring; r/t h/o MR and flail mitral leaflets due to endocarditis  . Cataract extraction  2009, 2012    right 2009, left 2012  . Transthoracic echocardiogram  08/15/2012    EF=>55%; normal LV systolic function; RV mildly dilated & systolic function mildly reduced; RA mod dilated; mild -mod MR & mildy increased gradients; mild TR; AV mildly sclerotic with mild-mod regurg    There were no vitals taken for this visit.  Visit Diagnosis:  Abnormality of gait  Abnormal posture  Bradykinesia      Subjective Assessment - 12/24/14 0941    Symptoms Pt reports decreased pain in L foot.  Has been doing exercises from MD and icing   Currently in Pain? No/denies          [facility] PT Assessment - 12/23/14 [id number]    Assessment   Medical Diagnosis Parkinson's disease   Precautions   Precautions Fall  12/16/14:  Avoid excessive walking for 3 week (pl fasciitis)   Balance Screen   Has the patient fallen in the past 6 months Yes   How many times? 4   Has the patient had a decrease in activity level because of a  fear of falling?  No   Is the patient reluctant to leave their home because of a fear of falling?  No   Home Environment   Living Enviornment Private residence   Living Arrangements Spouse/significant other   Available Help at Discharge Family   Type of Home House   Home Access Stairs to enter   Entrance Stairs-Number of Steps 1   Entrance Stairs-Rails None   Home Layout Able to live on main level with bedroom/bathroom   Home Equipment Cane - single point;Grab bars - toilet;Grab bars - tub/shower;Shower seat   Prior Function   Level of Independence Independent with basic ADLs;Independent with transfers;Independent with gait  slowed with buttons   Leisure Enjoys walking, has exercise bike (new at his home)   Observation/Other Assessments   Focus on Therapeutic Outcomes (FOTO)  Functional Status intake score 49; Neuro QOL score35.8   Strength   Overall Strength --  grossly tested 4/5 throughout; feels weaker over time   Transfers   Transfers Sit to Stand;Stand to Sit   Sit to Stand From chair/3-in-1;Five times sit to stand;5: Supervision  11.42, with bilateral flexed knees   Ambulation/Gait   Ambulation/Gait Yes   Ambulation/Gait Assistance 5: Supervision   Ambulation/Gait Assistance Details reports increased pain in foot over time  Ambulation Distance (Feet) 120 Feet   Assistive device --  bilateral canes   Gait Pattern Decreased step length - right;Decreased step length - left;Decreased stance time - right;Decreased stance time - left;Decreased stride length;Decreased dorsiflexion - right;Decreased dorsiflexion - left;Trunk flexed;Narrow base of support;Poor foot clearance - left;Poor foot clearance - right   Ambulation Surface Indoor   Gait velocity 15.87 sec with rollator=2.06 ft/sec  14.90 sec with RW=2.20 ft/sec   Standardized Balance Assessment   Standardized Balance Assessment Timed Up and Go Test   Timed Up and Go Test   Normal TUG (seconds) 21.72  cane; RW 19.66 sec;  rollator 19.99 sec                  [facility] Adult PT Treatment/Exercise - 12/24/14 0001    Transfers   Transfers Sit to Stand;Stand to Sit   Sit to Stand With upper extremity assist;Without upper extremity assist;5: Supervision;Other/comment  from 20"mat, 18" and 16" simulated couch-need UE from 16"   Sit to Stand Details (indicate cue type and reason) repeated x 10 times from each surface   Stand to Sit 5: Supervision   Ambulation/Gait   Ambulation/Gait Yes   Ambulation/Gait Assistance 5: Supervision   Ambulation/Gait Assistance Details cues for safety with turns and cues to push from chair   Ambulation Distance (Feet) 300 Feet  three times   Assistive device Rollator   Gait Pattern Decreased step length - right;Decreased step length - left;Decreased stride length;Decreased dorsiflexion - right;Decreased dorsiflexion - left;Trunk flexed   Ambulation Surface Indoor;Level   Stairs Yes   Stairs Assistance 5: Supervision   Stairs Assistance Details (indicate cue type and reason) cues for step to pattern   Stair Management Technique One rail Left;Step to pattern;Forwards   Number of Stairs 4  three times   Height of Stairs 6   Knee/Hip Exercises: Aerobic   Stationary Bike Scifit level 2.5 all 4 extremities x 5 minutes at >90 rpm   Knee/Hip Exercises: Standing   Forward Step Up Both;2 sets;10 reps;Hand Hold: 2;Hand Hold: 1;Step Height: 6"                PT Education - 12/24/14 1205    Education provided Yes   Education Details safe use of Rollator, stair training, sit to stand from low surfaces   Person(s) Educated Patient;Spouse   Methods Explanation;Demonstration;Verbal cues   Comprehension Verbalized understanding             PT Long Term Goals - 12/23/14 1628    PT LONG TERM GOAL #1   Title Pt will perform HEP with wife's supervision for improved strength, flexibility, balance, and gait.   Time 4   Period Weeks   Status New   PT LONG TERM GOAL #2    Title Pt will improve Timed Up and Go score to less than or equal to 18 seconds for decreased fall risk.   Time 4   Period Weeks   Status New   PT LONG TERM GOAL #3   Title improve gait velocity to at least 2.62 ft/sec for improved gait efficiency and safety.   Time 4   Period Weeks   Status New   PT LONG TERM GOAL #4   Title perform at least 5 reps of sit<>stand with minimal to no UE support for imrpoved safety and efficiency with transfers.   Time 4   Period Weeks   Status New   PT LONG TERM GOAL #5  Title verbalize plans for continued community fitness upon D/C from PT.   Time 4   Period Weeks   Status New               Plan - 12/24/14 1206    Clinical Impression Statement Pt's wife reports patient was going up and down stairs by scooting on floor/step.  Pt safe with stair instruction today.  Pt going out of town and will not be present next week.   Pt will benefit from skilled therapeutic intervention in order to improve on the following deficits Abnormal gait;Decreased activity tolerance;Decreased balance;Decreased mobility;Decreased strength;Difficulty walking;Pain;Postural dysfunction   Rehab Potential Good   PT Frequency 2x / week   PT Duration 4 weeks   PT Next Visit Plan Rollator instruction, reiterating use of brakes, posture, steering and technique to sit; initiate stretching for lower extremities; PWR! moves in sitting and standing for weightshifting; transfers   PT Home Exercise Plan stretching, PWR! Moves in sitting and standing   Consulted and Agree with Plan of Care Patient;Family member/caregiver   Family Member Consulted wife, Catalina Lunger - Jan 18, 2015 1631    Functional Assessment Tool Used TUG 21.72 seconds; 2.06 ft/sec gait velocity with rollator   Functional Limitation Mobility: Walking and moving around   Mobility: Walking and Moving Around Current Status [phone]) At least 40 percent but less than 60 percent impaired, limited or  restricted   Mobility: Walking and Moving Around Goal Status [phone]) At least 20 percent but less than 40 percent impaired, limited or restricted      Problem List Patient Active Problem List   Diagnosis Date Noted  . Parkinson's disease 08/06/2013  . Dyslipidemia 08/06/2013  . S/P mitral valve repair 08/06/2013    Newell Coral 12/24/2014, 12:10 PM  [facility] [address] [place], [place], [address] Phone: [phone]   Fax:  [phone]     Newell Coral, [facility] 12/24/2014 12:10 PM Phone: [phone] Fax: [phone]

## 2015-01-06 ENCOUNTER — Ambulatory Visit: Payer: Medicare Other | Admitting: Physical Therapy

## 2015-01-07 ENCOUNTER — Ambulatory Visit: Payer: Medicare Other | Admitting: Physical Therapy

## 2015-01-14 ENCOUNTER — Ambulatory Visit: Payer: Medicare Other | Admitting: Physical Therapy

## 2015-01-14 DIAGNOSIS — R269 Unspecified abnormalities of gait and mobility: Secondary | ICD-10-CM | POA: Diagnosis not present

## 2015-01-14 DIAGNOSIS — R293 Abnormal posture: Secondary | ICD-10-CM

## 2015-01-14 DIAGNOSIS — R258 Other abnormal involuntary movements: Secondary | ICD-10-CM

## 2015-01-14 NOTE — Patient Instructions (Signed)
Shoulder (Scapula) Retraction   Pull shoulders back, squeezing shoulder blades together. Repeat _10_ times per session. Do several times per day Position: Standing or sitting against the wall  Copyright  VHI. All rights reserved.  Axial Extension (Chin Tuck)   Pull chin in and lengthen back of neck. Hold __3__ seconds while counting out loud. Repeat __10__ times. Do _several___ sessions per day.  http://gt2.exer.us/449   Copyright  VHI. All rights reserved.   Pt and wife provided with PWR! Moves in Sitting and Standing x 20 reps, to be performed 1-2 times per day.

## 2015-01-14 NOTE — Therapy (Signed)
[facility] [address] [place], [place], [address] Phone: [phone]   Fax:  [phone]  Physical Therapy Treatment  Patient Details  Name: [name] [name] MRN: [id number] Date of Birth: 1931-12-08 Referring Provider:  Ezequiel Kayser, MD  Encounter Date: 01/14/2015      PT End of Session - 01/14/15 1339    Visit Number 3   Number of Visits 17   Date for PT Re-Evaluation 02/16/15   Authorization Type Medicare G-code every 10th visit   PT Start Time 0934   PT Stop Time 1019   PT Time Calculation (min) 45 min   Activity Tolerance Patient tolerated treatment well  fatigues easily      Past Medical History  Diagnosis Date  . Parkinson's disease   . S/P mitral valve replacement     mitral regurg, endocarditis  . Dyslipidemia     Past Surgical History  Procedure Laterality Date  . Mitral valve annuloplasty  04/10/2007     26mm Edwards ring; r/t h/o MR and flail mitral leaflets due to endocarditis  . Cataract extraction  2009, 2012    right 2009, left 2012  . Transthoracic echocardiogram  08/15/2012    EF=>55%; normal LV systolic function; RV mildly dilated & systolic function mildly reduced; RA mod dilated; mild -mod MR & mildy increased gradients; mild TR; AV mildly sclerotic with mild-mod regurg    There were no vitals taken for this visit.  Visit Diagnosis:  Abnormal posture  Bradykinesia      Subjective Assessment - 01/14/15 0938    Symptoms Having less pain in the foot-fasciitis has pretty much resolved.   Currently in Pain? No/denies                 SciFit, Level 2.5, 4 extremities x 4 minutes, with cues provided for smooth, controlled movement patterns, for improved flexibility and strength.      PWR [facility]) - 01/14/15 0940    PWR! exercises Moves in sitting;Moves in standing   PWR! Up 20   PWR! Rock 20   PWR! Twist 20   PWR Step 20   PWR! Up 20   PWR! Rock 20   PWR! Twist 20   PWR! Step 20   Comments Verbal and visual cues for technique, large amplitude movements     PWR! Up performed for improved posture, PWR! Rock for improved weightshifting, PWR! Twist for trunk flexibility, PWR! Step for initial step amplitude and weightshifting.  In standing at wall-pt performs neck retraction x 5 reps, then shoulder retraction x 5 reps.        PT Education - 01/14/15 1338    Education provided Yes   Education Details HEP-PWR! Moves in sitting, seated/standing posture exercises at wall   Person(s) Educated Patient;Spouse   Methods Explanation;Demonstration;Handout   Comprehension Verbalized understanding;Returned demonstration;Tactile cues required;Need further instruction             PT Long Term Goals - 12/23/14 1628    PT LONG TERM GOAL #1   Title Pt will perform HEP with wife's supervision for improved strength, flexibility, balance, and gait.   Time 4   Period Weeks   Status New   PT LONG TERM GOAL #2   Title Pt will improve Timed Up and Go score to less than or equal to 18 seconds for decreased fall risk.   Time 4   Period Weeks   Status New   PT LONG TERM GOAL #3  Title improve gait velocity to at least 2.62 ft/sec for improved gait efficiency and safety.   Time 4   Period Weeks   Status New   PT LONG TERM GOAL #4   Title perform at least 5 reps of sit<>stand with minimal to no UE support for imrpoved safety and efficiency with transfers.   Time 4   Period Weeks   Status New   PT LONG TERM GOAL #5   Title verbalize plans for continued community fitness upon D/C from PT.   Time 4   Period Weeks   Status New               Plan - 01/14/15 1340    Clinical Impression Statement Pt overall appears more steady with gait today and reports not using rollator since last visit, due to foot pain significantly improved.  Initiated HEP today for PWR! Moves and for postural exercises.  Pt will continue to benefit from further skilled PT to  address balance and gait.   Pt will benefit from skilled therapeutic intervention in order to improve on the following deficits Abnormal gait;Decreased activity tolerance;Decreased balance;Decreased mobility;Decreased strength;Difficulty walking;Pain;Postural dysfunction   Rehab Potential Good   PT Frequency 2x / week  Pt prefers once per week   PT Duration 4 weeks  this is 2nd week after eval   PT Treatment/Interventions ADLs/Self Care Home Management;DME Instruction;Therapeutic activities;Functional mobility training;Gait training;Therapeutic exercise;Balance training;Neuromuscular re-education;Patient/family education   PT Next Visit Plan Review posture exercises and PWR! Move in sitting; add PWR! Moves in standing to HEP; work on standing balance, weightshifting and turns   Consulted and Agree with Plan of Care Patient;Family member/caregiver   Family Member Consulted wife, Judeth CornfieldStephanie        Problem List Patient Active Problem List   Diagnosis Date Noted  . Parkinson's disease 08/06/2013  . Dyslipidemia 08/06/2013  . S/P mitral valve repair 08/06/2013    MARRIOTT,AMY W. 01/14/2015, 1:43 PM  Lonia BloodAmy Marriott, PT 01/14/2015 1:46 PM Phone: [phone] Fax: [phone]   [facility] [address] [place], [place], [address] Phone: [phone]   Fax:  [phone]

## 2015-01-21 ENCOUNTER — Ambulatory Visit: Payer: Medicare Other | Attending: Internal Medicine | Admitting: Physical Therapy

## 2015-01-21 DIAGNOSIS — Z952 Presence of prosthetic heart valve: Secondary | ICD-10-CM | POA: Diagnosis not present

## 2015-01-21 DIAGNOSIS — G20 Parkinson's disease: Secondary | ICD-10-CM | POA: Insufficient documentation

## 2015-01-21 DIAGNOSIS — R258 Other abnormal involuntary movements: Secondary | ICD-10-CM | POA: Diagnosis not present

## 2015-01-21 DIAGNOSIS — R293 Abnormal posture: Secondary | ICD-10-CM | POA: Diagnosis not present

## 2015-01-21 DIAGNOSIS — R269 Unspecified abnormalities of gait and mobility: Secondary | ICD-10-CM

## 2015-01-21 DIAGNOSIS — E785 Hyperlipidemia, unspecified: Secondary | ICD-10-CM | POA: Insufficient documentation

## 2015-01-21 NOTE — Therapy (Signed)
[facility] [address] [place], [place], [address] Phone: [phone]   Fax:  [phone]  Physical Therapy Treatment  Patient Details  Name: [name] HensenJohn [name] MRN: [id number] Date of Birth: 12/23/1931 Referring Provider:  Ezequiel KayserPerini, Mark A, MD  Encounter Date: 01/21/2015      PT End of Session - 01/22/15 0737    Visit Number 4   Number of Visits 17   Date for PT Re-Evaluation 02/16/15   Authorization Type Medicare G-code every 10th visit   PT Start Time 0934   PT Stop Time 1015   PT Time Calculation (min) 41 min   Equipment Utilized During Treatment Gait belt   Activity Tolerance Patient tolerated treatment well      Past Medical History  Diagnosis Date  . Parkinson's disease   . S/P mitral valve replacement     mitral regurg, endocarditis  . Dyslipidemia     Past Surgical History  Procedure Laterality Date  . Mitral valve annuloplasty  04/10/2007     26mm Edwards ring; r/t h/o MR and flail mitral leaflets due to endocarditis  . Cataract extraction  2009, 2012    right 2009, left 2012  . Transthoracic echocardiogram  08/15/2012    EF=>55%; normal LV systolic function; RV mildly dilated & systolic function mildly reduced; RA mod dilated; mild -mod MR & mildy increased gradients; mild TR; AV mildly sclerotic with mild-mod regurg    There were no vitals taken for this visit.  Visit Diagnosis:  Abnormal posture  Bradykinesia  Abnormality of gait      Subjective Assessment - 01/21/15 0936    Symptoms No pain, no falls; already been for  a walk today   Currently in Pain? No/denies       Self Care:  Discussed current walking program-walking outside at least 20-25 minutes each day, brief stretches and strengthening exercise program at home.  Has bike at home but does not use.  Discussed performing PWR! Moves and Parkinson's exercises daily to address Parkinson's specific deficits.  Discussed/reiterated purpose of  PWR! Moves to assist in re-calibrating movement patterns to offset slower, smaller movements.  At end of session, wife reports short term memory issues hinder patient's full performance of HEP.  PT discussed briefly wife's use of cues to assist pt with HEP performance.  Gait:  Gait activities in gym area-indoor, level surfaces, x 500 ft with cues for upright posture, arm swing and use of visual cues to maintain upright posture.  Pt does not use assistive device.  Forward/backward walking at counter, then progressing away from counter 5 reps x 10 feet each direction.  Sidestepping at counter, then no UE support, 3 reps x 10 ft each direction, with cues for upright posture and for improved step length.               PWR [facility] - 01/21/15 [id number]    PWR! exercises Moves in sitting;Moves in standing   PWR! Up 20   PWR! Rock 20  2 reps; one with UE support, other with reaches   PWR! Twist 20   PWR Step 20   Basic 4 Flow 20   Comments cues for UE support; technique for deliberate, large movement patterns   PWR! Up 20   PWR! Rock 20   PWR! Twist 20   PWR! Step 20   Basic 4 Flow 20   Comments Cues for technique, large amplitude movements       PWR! Moves performed  as follows:  PWR! Up for improved posture, PWR! Rock for improved weightshifting, PWR! Twist for improved trunk rotation, PWR! Step for improved step initiation.  At counter-backward step and weightshift x 10 reps with UE support and min guard assistance/cues for proper step length and weightshift.             PT Long Term Goals - 12/23/14 1628    PT LONG TERM GOAL #1   Title Pt will perform HEP with wife's supervision for improved strength, flexibility, balance, and gait.   Time 4   Period Weeks   Status New   PT LONG TERM GOAL #2   Title Pt will improve Timed Up and Go score to less than or equal to 18 seconds for decreased fall risk.   Time 4   Period Weeks   Status New   PT LONG TERM GOAL #3   Title improve  gait velocity to at least 2.62 ft/sec for improved gait efficiency and safety.   Time 4   Period Weeks   Status New   PT LONG TERM GOAL #4   Title perform at least 5 reps of sit<>stand with minimal to no UE support for imrpoved safety and efficiency with transfers.   Time 4   Period Weeks   Status New   PT LONG TERM GOAL #5   Title verbalize plans for continued community fitness upon D/C from PT.   Time 4   Period Weeks   Status New               Plan - 01/22/15 0737    Clinical Impression Statement Pt continues to need cues for large amplitude movement patterns with exercises and gait activities.  Pt will continue to benefit from further skilled PT to address balance and gait.   Pt will benefit from skilled therapeutic intervention in order to improve on the following deficits Abnormal gait;Decreased activity tolerance;Decreased balance;Decreased mobility;Decreased strength;Difficulty walking;Pain;Postural dysfunction   Rehab Potential Good   PT Frequency 2x / week  Pt prefers once per week   PT Duration 4 weeks  this is 3rd week after eval   PT Treatment/Interventions ADLs/Self Care Home Management;DME Instruction;Therapeutic activities;Functional mobility training;Gait training;Therapeutic exercise;Balance training;Neuromuscular re-education;Patient/family education   PT Next Visit Plan Review HEP; check goals and renew next visit   Consulted and Agree with Plan of Care Patient;Family member/caregiver   Family Member Consulted wife, [name]        Problem List Patient Active Problem List   Diagnosis Date Noted  . Parkinson's disease 08/06/2013  . Dyslipidemia 08/06/2013  . S/P mitral valve repair 08/06/2013    Shateka Petrea W. 01/22/2015, 7:41 AM  For treatment performed on 01/21/15 Lonia Blood, PT 01/22/2015 7:41 AM Phone: [phone] Fax: [phone]   [facility] [address] [place], [place], [address] Phone: [phone]   Fax:  [phone]

## 2015-02-04 ENCOUNTER — Ambulatory Visit: Payer: Medicare Other | Admitting: Physical Therapy

## 2015-02-04 DIAGNOSIS — R269 Unspecified abnormalities of gait and mobility: Secondary | ICD-10-CM

## 2015-02-04 DIAGNOSIS — R258 Other abnormal involuntary movements: Secondary | ICD-10-CM

## 2015-02-04 DIAGNOSIS — R293 Abnormal posture: Secondary | ICD-10-CM

## 2015-02-05 NOTE — Therapy (Signed)
[place] [address] [place], [place], [address] Phone: [phone]   Fax:  [phone]  Physical Therapy Treatment  Patient Details  Name: [name] [name] MRN: [id number] Date of Birth: 11/09/1932 Referring Provider:  Crist Infante, MD  Encounter Date: 02/04/2015      PT End of Session - 02/05/15 0840    Visit Number 5   Number of Visits 17   Date for PT Re-Evaluation 25/63/89  recert completed for additional 4 weeks from 02/04/15 session      Past Medical History  Diagnosis Date  . Parkinson's disease   . S/P mitral valve replacement     mitral regurg, endocarditis  . Dyslipidemia     Past Surgical History  Procedure Laterality Date  . Mitral valve annuloplasty  04/10/2007     67m Edwards ring; r/t h/o MR and flail mitral leaflets due to endocarditis  . Cataract extraction  2009, 2012    right 2009, left 2012  . Transthoracic echocardiogram  08/15/2012    EF=>55%; normal LV systolic function; RV mildly dilated & systolic function mildly reduced; RA mod dilated; mild -mod MR & mildy increased gradients; mild TR; AV mildly sclerotic with mild-mod regurg    There were no vitals filed for this visit.  Visit Diagnosis:  Bradykinesia - Plan: PT plan of care cert/re-cert  Abnormality of gait - Plan: PT plan of care cert/re-cert  Abnormal posture - Plan: PT plan of care cert/re-cert      Subjective Assessment - 02/04/15 0939    Symptoms No pain, no falls   Currently in Pain? No/denies            [facility] 02/04/15 0945    Transfers   Transfers Sit to Stand;Stand to Sit   Sit to Stand 7: Independent;Without upper extremity assist;Five times sit to stand  5x sit<>stand 12.94 sec   Stand to Sit 7: Independent   Ambulation/Gait   Gait velocity 8.26 sec=3.97 ft/sec no device   Timed Up and Go Test   Normal TUG (seconds) 10.99   Functional Gait  Assessment   Gait assessed  Yes   Gait Level  Surface Walks 20 ft in less than 7 sec but greater than 5.5 sec, uses assistive device, slower speed, mild gait deviations, or deviates 6-10 in outside of the 12 in walkway width.  6.84 sec   Change in Gait Speed Able to smoothly change walking speed without loss of balance or gait deviation. Deviate no more than 6 in outside of the 12 in walkway width.   Gait with Horizontal Head Turns Performs head turns smoothly with slight change in gait velocity (eg, minor disruption to smooth gait path), deviates 6-10 in outside 12 in walkway width, or uses an assistive device.   Gait with Vertical Head Turns Performs task with slight change in gait velocity (eg, minor disruption to smooth gait path), deviates 6 - 10 in outside 12 in walkway width or uses assistive device   Gait and Pivot Turn Turns slowly, requires verbal cueing, or requires several small steps to catch balance following turn and stop  stutter steps during turn   Step Over Obstacle Is able to step over one shoe box (4.5 in total height) without changing gait speed. No evidence of imbalance.   Gait with Narrow Base of Support Ambulates less than 4 steps heel to toe or cannot perform without assistance.   Gait with Eyes Closed Walks 20 ft, slow  speed, abnormal gait pattern, evidence for imbalance, deviates 10-15 in outside 12 in walkway width. Requires more than 9 sec to ambulate 20 ft.  9.49 sec   Ambulating Backwards Walks 20 ft, slow speed, abnormal gait pattern, evidence for imbalance, deviates 10-15 in outside 12 in walkway width.  short, quick steps with forward flexed posture   Steps Alternating feet, must use rail.   Total Score 16                      PWR ([facility]) - 02/04/15 1000    PWR! exercises Moves in standing   PWR! Up 20   PWR! Rock 20   PWR Step 20  forward/back step x 10 at counter; fwd step over hurdle x 10   Comments 20 additional reps of PWR! Rock with UE support for improved single limb stance                   PT Long Term Goals - 02/05/15 0827    Additional Long Term Goals   Additional Long Term Goals Yes   PT LONG TERM GOAL #6   Title Pt will ambulate at least 1000 ft, indoor and outdoor surfaces, modified independently (walking poles as needed) with no episodes of foot catching on ground.   Time 4   Period Weeks   Status New   PT LONG TERM GOAL #7   Title Pt will improve Functional Gait Assessment to at least 20/30 for improved dynamic gait and decreased fall risk.   Baseline FGA score 16/30   Time 4   Period Weeks   Status New               Plan - 02/05/15 1610    Clinical Impression Statement Pt has met all long term goals and has made significant functional gains.  Pt is having little to no pain, which is helping overall ability to move, and pt is no longer using assistive device.  Pt does show that he is at risk for falls per Functional Gait Assessment, and pt reports ongoing issues with foot clearance, near tripping on compliant surfaces.  Pt would benefit from further skilled PT to address compliant surface balance and dynamic gait activities.   Pt will benefit from skilled therapeutic intervention in order to improve on the following deficits Abnormal gait;Decreased activity tolerance;Decreased balance;Decreased mobility;Decreased strength;Difficulty walking;Pain;Postural dysfunction   Rehab Potential Good   PT Frequency 1x / week   PT Duration 4 weeks  Recert completed this visit for 4 additional weeks   PT Treatment/Interventions ADLs/Self Care Home Management;DME Instruction;Therapeutic activities;Functional mobility training;Gait training;Therapeutic exercise;Balance training;Neuromuscular re-education;Patient/family education   PT Next Visit Plan compliant surface balance exercises, change of directions, turns   Consulted and Agree with Plan of Care Patient        Problem List Patient Active Problem List   Diagnosis Date Noted  .  Parkinson's disease 08/06/2013  . Dyslipidemia 08/06/2013  . S/P mitral valve repair 08/06/2013    Dennison Mcdaid W. 02/05/2015, 8:40 AM  [facility] [address] [place], [place], [address] Phone: [phone]   Fax:  [phone]

## 2015-02-05 NOTE — Therapy (Signed)
[place] [address] [place], [place], [address] Phone: [phone]   Fax:  [phone]  Physical Therapy Treatment  Patient Details  Name: [name] [name] MRN: [id number] Date of Birth: [birth year] Referring Provider:  Crist Infante, MD  Encounter Date: 02/04/2015      PT End of Session - 02/05/15 1324    Visit Number 5   Number of Visits 17   Date for PT Re-Evaluation 02/16/15   Authorization Type Medicare G-code every 10th visit   PT Start Time 0937   PT Stop Time 1015   PT Time Calculation (min) 38 min   Activity Tolerance Patient tolerated treatment well      Past Medical History  Diagnosis Date  . Parkinson's disease   . S/P mitral valve replacement     mitral regurg, endocarditis  . Dyslipidemia     Past Surgical History  Procedure Laterality Date  . Mitral valve annuloplasty  04/10/2007     67m Edwards ring; r/t h/o MR and flail mitral leaflets due to endocarditis  . Cataract extraction  2009, 2012    right 2009, left 2012  . Transthoracic echocardiogram  08/15/2012    EF=>55%; normal LV systolic function; RV mildly dilated & systolic function mildly reduced; RA mod dilated; mild -mod MR & mildy increased gradients; mild TR; AV mildly sclerotic with mild-mod regurg    There were no vitals filed for this visit.  Visit Diagnosis:  Bradykinesia  Abnormality of gait  Abnormal posture      Subjective Assessment - 02/04/15 0939    Symptoms No pain, no falls   Currently in Pain? No/denies            [facility] - 02/04/15 0945    Transfers   Transfers Sit to Stand;Stand to Sit   Sit to Stand 7: Independent;Without upper extremity assist;Five times sit to stand  5x sit<>stand 12.94 sec   Stand to Sit 7: Independent   Ambulation/Gait   Gait velocity 8.26 sec=3.97 ft/sec no device   Timed Up and Go Test   Normal TUG (seconds) 10.99   Functional Gait  Assessment   Gait assessed  Yes    Gait Level Surface Walks 20 ft in less than 7 sec but greater than 5.5 sec, uses assistive device, slower speed, mild gait deviations, or deviates 6-10 in outside of the 12 in walkway width.  6.84 sec   Change in Gait Speed Able to smoothly change walking speed without loss of balance or gait deviation. Deviate no more than 6 in outside of the 12 in walkway width.   Gait with Horizontal Head Turns Performs head turns smoothly with slight change in gait velocity (eg, minor disruption to smooth gait path), deviates 6-10 in outside 12 in walkway width, or uses an assistive device.   Gait with Vertical Head Turns Performs task with slight change in gait velocity (eg, minor disruption to smooth gait path), deviates 6 - 10 in outside 12 in walkway width or uses assistive device   Gait and Pivot Turn Turns slowly, requires verbal cueing, or requires several small steps to catch balance following turn and stop  stutter steps during turn   Step Over Obstacle Is able to step over one shoe box (4.5 in total height) without changing gait speed. No evidence of imbalance.   Gait with Narrow Base of Support Ambulates less than 4 steps heel to toe or cannot perform without assistance.   Gait with  Eyes Closed Walks 20 ft, slow speed, abnormal gait pattern, evidence for imbalance, deviates 10-15 in outside 12 in walkway width. Requires more than 9 sec to ambulate 20 ft.  9.49 sec   Ambulating Backwards Walks 20 ft, slow speed, abnormal gait pattern, evidence for imbalance, deviates 10-15 in outside 12 in walkway width.  short, quick steps with forward flexed posture   Steps Alternating feet, must use rail.   Total Score 16                      PWR ([facility]) - 02/04/15 1000    PWR! exercises Moves in standing   PWR! Up 20   PWR! Rock 20   PWR Step 20  forward/back step x 10 at counter; fwd step over hurdle x 10   Comments 20 additional reps of PWR! Rock with UE support for improved single limb  stance    PT provides cues for large amplitude movements and technique for PWR! Moves for posture, weightshifting, and initial stepping in varied directions.  Forward step and weightshifting x 10 reps, then back step and weightshifting x 10 reps for improved weigthshifting excursion.  Discussed progress with goals and plans to continue to address balance concerns.              PT Long Term Goals - 02/05/15 0827    Additional Long Term Goals   Additional Long Term Goals Yes   PT LONG TERM GOAL #6   Title Pt will ambulate at least 1000 ft, indoor and outdoor surfaces, modified independently (walking poles as needed) with no episodes of foot catching on ground.   Time 4   Period Weeks   Status New   PT LONG TERM GOAL #7   Title Pt will improve Functional Gait Assessment to at least 20/30 for improved dynamic gait and decreased fall risk.   Baseline FGA score 16/30   Time 4   Period Weeks   Status New               Plan - 02/05/15 9977    Clinical Impression Statement Pt has met all long term goals and has made significant functional gains.  Pt is having little to no pain, which is helping overall ability to move, and pt is no longer using assistive device.  Pt does show that he is at risk for falls per Functional Gait Assessment, and pt reports ongoing issues with foot clearance, near tripping on compliant surfaces.  Pt would benefit from further skilled PT to address compliant surface balance and dynamic gait activities.   Pt will benefit from skilled therapeutic intervention in order to improve on the following deficits Abnormal gait;Decreased activity tolerance;Decreased balance;Decreased mobility;Decreased strength;Difficulty walking;Pain;Postural dysfunction   Rehab Potential Good   PT Frequency 1x / week   PT Duration 4 weeks  Recert completed this visit for 4 additional weeks   PT Treatment/Interventions ADLs/Self Care Home Management;DME Instruction;Therapeutic  activities;Functional mobility training;Gait training;Therapeutic exercise;Balance training;Neuromuscular re-education;Patient/family education   PT Next Visit Plan compliant surface balance exercises, change of directions, turns   Consulted and Agree with Plan of Care Patient        Problem List Patient Active Problem List   Diagnosis Date Noted  . Parkinson's disease 08/06/2013  . Dyslipidemia 08/06/2013  . S/P mitral valve repair 08/06/2013    Maicee Ullman W. 02/05/2015, 8:32 AM  Mady Haagensen, PT 02/05/2015 8:38 AM Phone: [phone] Fax: [place]  [place] [address] [place], [place], [address] Phone: [phone]   Fax:  [phone]

## 2015-02-11 ENCOUNTER — Ambulatory Visit: Payer: Medicare Other | Admitting: Physical Therapy

## 2015-02-11 DIAGNOSIS — R258 Other abnormal involuntary movements: Secondary | ICD-10-CM

## 2015-02-11 DIAGNOSIS — R269 Unspecified abnormalities of gait and mobility: Secondary | ICD-10-CM | POA: Diagnosis not present

## 2015-02-12 NOTE — Therapy (Signed)
[facility] [address] [place], [place], [address] Phone: [phone]   Fax:  [phone]  Physical Therapy Treatment  Patient Details  Name: [name] HensenJohn [name] MRN: [id number] Date of Birth: 05/02/1932 Referring Provider:  Rodrigo RanPerini, Mark, MD  Encounter Date: 02/11/2015      PT End of Session - 02/12/15 1127    Visit Number 6   Number of Visits 17   Date for PT Re-Evaluation 03/07/15  recert completed for additional 4 weeks from 02/04/15 session   Authorization Type Medicare G-code every 10th visit   PT Start Time 0934   PT Stop Time 1015   PT Time Calculation (min) 41 min   Equipment Utilized During Treatment Gait belt   Activity Tolerance Patient tolerated treatment well      Past Medical History  Diagnosis Date  . Parkinson's disease   . S/P mitral valve replacement     mitral regurg, endocarditis  . Dyslipidemia     Past Surgical History  Procedure Laterality Date  . Mitral valve annuloplasty  04/10/2007     26mm Edwards ring; r/t h/o MR and flail mitral leaflets due to endocarditis  . Cataract extraction  2009, 2012    right 2009, left 2012  . Transthoracic echocardiogram  08/15/2012    EF=>55%; normal LV systolic function; RV mildly dilated & systolic function mildly reduced; RA mod dilated; mild -mod MR & mildy increased gradients; mild TR; AV mildly sclerotic with mild-mod regurg    There were no vitals filed for this visit.  Visit Diagnosis:  Bradykinesia  Abnormality of gait      Subjective Assessment - 02/11/15 0938    Symptoms Already been for a walk this morning; no falls recently   Currently in Pain? No/denies                       [facility] Treatment/Exercise - 02/11/15 0941    Ambulation/Gait   Ambulation/Gait Yes   Ambulation/Gait Assistance 5: Supervision   Ambulation/Gait Assistance Details cues for posture  activities for environmental scanning   Ambulation  Distance (Feet) 400 Feet  2 reps   Assistive device None   Ambulation Surface Indoor;Level   High Level Balance   High Level Balance Activities Weight-shifting turns;Figure 8 turns  using balance disks in clock shape, multiple reps   High Level Balance Comments on foam beam, marching in place x 10, forward kicks x 20, forward step taps x 20, back step taps x 20 with UE support, on foam with head turns and nods x 10 reps, UE lifts x 10 reps with supervision.     Knee/Hip Exercises: Aerobic   Stationary Bike SciFit, Level 3.6, 4 extremities x 5 minutes for leg strengthening and flexibility   Knee/Hip Exercises: Standing   Forward Step Up Right;Left;1 set;20 reps;Hand Hold: 2;Step Height: 6"  cues for full knee extension/quad activation   Forward Step Up Limitations for leg strengthening   Functional Squat 2 sets;10 reps  on foam beam with UE support                     PT Long Term Goals - 02/05/15 0827    Additional Long Term Goals   Additional Long Term Goals Yes   PT LONG TERM GOAL #6   Title Pt will ambulate at least 1000 ft, indoor and outdoor surfaces, modified independently (walking poles as needed) with no episodes of foot catching on  ground.   Time 4   Period Weeks   Status New   PT LONG TERM GOAL #7   Title Pt will improve Functional Gait Assessment to at least 20/30 for improved dynamic gait and decreased fall risk.   Baseline FGA score 16/30   Time 4   Period Weeks   Status New               Plan - 02/12/15 1130    Clinical Impression Statement Addressed compliant surface activities and dynamic gait activities, with cues for posture and increased step length.  Pt will continue to benefit from further skilled PT to address balance and gait.   Pt will benefit from skilled therapeutic intervention in order to improve on the following deficits Abnormal gait;Decreased activity tolerance;Decreased balance;Decreased mobility;Decreased strength;Difficulty  walking;Pain;Postural dysfunction   Rehab Potential Good   PT Frequency 1x / week   PT Duration 2 weeks  wk 2 of 4   PT Treatment/Interventions ADLs/Self Care Home Management;DME Instruction;Therapeutic activities;Functional mobility training;Gait training;Therapeutic exercise;Balance training;Neuromuscular re-education;Patient/family education   PT Next Visit Plan Review and possibly progress HEP; compliant surface balance exercises, change of directions, turns   Consulted and Agree with Plan of Care Patient        Problem List Patient Active Problem List   Diagnosis Date Noted  . Parkinson's disease 08/06/2013  . Dyslipidemia 08/06/2013  . S/P mitral valve repair 08/06/2013    [name] Eckersley W. 02/12/2015, 11:32 AM  [name] [name], PT 02/12/2015 11:33 AM Phone: [phone] Fax: [phone]   [facility] [address] [place], [place], [address] Phone: [phone]   Fax:  [phone]

## 2015-02-18 ENCOUNTER — Ambulatory Visit: Payer: Medicare Other | Admitting: Physical Therapy

## 2015-02-25 ENCOUNTER — Ambulatory Visit: Payer: Medicare Other | Attending: Internal Medicine | Admitting: Physical Therapy

## 2015-02-25 DIAGNOSIS — G20 Parkinson's disease: Secondary | ICD-10-CM | POA: Insufficient documentation

## 2015-02-25 DIAGNOSIS — R258 Other abnormal involuntary movements: Secondary | ICD-10-CM | POA: Diagnosis not present

## 2015-02-25 DIAGNOSIS — R269 Unspecified abnormalities of gait and mobility: Secondary | ICD-10-CM | POA: Diagnosis present

## 2015-02-25 DIAGNOSIS — R293 Abnormal posture: Secondary | ICD-10-CM

## 2015-02-25 DIAGNOSIS — E785 Hyperlipidemia, unspecified: Secondary | ICD-10-CM | POA: Diagnosis not present

## 2015-02-25 DIAGNOSIS — Z952 Presence of prosthetic heart valve: Secondary | ICD-10-CM | POA: Insufficient documentation

## 2015-02-25 NOTE — Therapy (Signed)
[facility] [address] [place], [place], [address] Phone: [phone]   Fax:  [phone]  Physical Therapy Treatment  Patient Details  Name: [name] [name] MRN: [id number] Date of Birth: [birth year] Referring Provider:  Crist Infante, MD  Encounter Date: 02/25/2015      PT End of Session - 02/25/15 1128    Visit Number 7   Date for PT Re-Evaluation 03/07/15   Authorization Type Medicare G-code every 10th visit   PT Start Time 0935   PT Stop Time 1020   PT Time Calculation (min) 45 min   Activity Tolerance Patient tolerated treatment well   Behavior During Therapy [facility] for tasks assessed/performed      Past Medical History  Diagnosis Date  . Parkinson's disease   . S/P mitral valve replacement     mitral regurg, endocarditis  . Dyslipidemia     Past Surgical History  Procedure Laterality Date  . Mitral valve annuloplasty  04/10/2007     59m Edwards ring; r/t h/o MR and flail mitral leaflets due to endocarditis  . Cataract extraction  2009, 2012    right 2009, left 2012  . Transthoracic echocardiogram  08/15/2012    EF=>55%; normal LV systolic function; RV mildly dilated & systolic function mildly reduced; RA mod dilated; mild -mod MR & mildy increased gradients; mild TR; AV mildly sclerotic with mild-mod regurg    There were no vitals filed for this visit.  Visit Diagnosis:  Abnormality of gait  Abnormal posture      Subjective Assessment - 02/25/15 0937    Subjective Had a few stumbles this weekend, but caught myself    Currently in Pain? No/denies            [facility] 09[facility]    Ambulation/Gait   Ambulation/Gait Yes   Ambulation/Gait Assistance 7: Independent   Ambulation/Gait Assistance Details occasional cues for posture   Ambulation Distance (Feet) 400 Feet   Assistive device None   Ambulation Surface Level;Indoor;Unlevel;Outdoor;Paved   Gait velocity 8.67 sec=3.78 ft/sec    Functional Gait  Assessment   Gait assessed  Yes   Gait Level Surface Walks 20 ft in less than 7 sec but greater than 5.5 sec, uses assistive device, slower speed, mild gait deviations, or deviates 6-10 in outside of the 12 in walkway width.   Change in Gait Speed Able to smoothly change walking speed without loss of balance or gait deviation. Deviate no more than 6 in outside of the 12 in walkway width.   Gait with Horizontal Head Turns Performs head turns smoothly with no change in gait. Deviates no more than 6 in outside 12 in walkway width   Gait with Vertical Head Turns Performs head turns with no change in gait. Deviates no more than 6 in outside 12 in walkway width.   Gait and Pivot Turn Pivot turns safely within 3 sec and stops quickly with no loss of balance.   Step Over Obstacle Is able to step over one shoe box (4.5 in total height) without changing gait speed. No evidence of imbalance.   Gait with Narrow Base of Support Is able to ambulate for 10 steps heel to toe with no staggering.   Gait with Eyes Closed Walks 20 ft, no assistive devices, good speed, no evidence of imbalance, normal gait pattern, deviates no more than 6 in outside 12 in walkway width. Ambulates 20 ft in less than 7 sec.   Ambulating Backwards  Walks 20 ft, no assistive devices, good speed, no evidence for imbalance, normal gait   Steps Alternating feet, must use rail.   Total Score 27     Functional Gait assessment score improved from 16/30 to 27/30.  Gait activities indoor/outdoor surfaces x 1000 ft, including sidewalks, inclines, negotiating tight turns and start/stops around furniture with no device, independently.  Pt has no loss of balance, no episode of foot catching ground with gait.  Pt does require occasional postural cues for upright posture during gait.  Self Care:  Discussed continued fitness options-discussed optimal fitness routine.  Pt currently walks for exercises daily with no device, outdoor  surfaces.  He also does stretching for low back daily and performs HEP from PT at least twice per week. Discussed benefit of adding aerobic activity into current fitness routine-has stationary bike at home.  Discussed with wife her role to provide cues for consistent performance of HEP.                    PT Education - 2015-03-04 1128    Education provided Yes   Education Details Optimal fitness program upon D/C from PT; progress towards goals, plans for return screen in 6 months   Person(s) Educated Patient;Spouse   Methods Explanation;Verbal cues;Handout   Comprehension Verbalized understanding;Returned demonstration             PT Long Term Goals - 04-Mar-2015 1308    PT LONG TERM GOAL #5   Title verbalize plans for continued community fitness upon D/C from PT.   Status Achieved   PT LONG TERM GOAL #6   Title Pt will ambulate at least 1000 ft, indoor and outdoor surfaces, modified independently (walking poles as needed) with no episodes of foot catching on ground.   Status Achieved   PT LONG TERM GOAL #7   Title Pt will improve Functional Gait Assessment to at least 20/30 for improved dynamic gait and decreased fall risk.   Baseline Functional Gait Assessment score 27/30   Status Achieved         Previous LTG #1-4 had been previously assessed.        Plan - Mar 04, 2015 1129    Clinical Impression Statement Long term goals 5, 6, and 7 assessed today, with pt meeting all goals.  Pt has demonstrated overall improved functional mobility and is appropriate for discharge from PT at this time to pursue community fitness activities.   Pt will benefit from skilled therapeutic intervention in order to improve on the following deficits Abnormal gait;Decreased activity tolerance;Decreased balance;Decreased mobility;Decreased strength;Difficulty walking;Pain;Postural dysfunction   Rehab Potential Good   PT Treatment/Interventions ADLs/Self Care Home Management;DME  Instruction;Therapeutic activities;Functional mobility training;Gait training;Therapeutic exercise;Balance training;Neuromuscular re-education;Patient/family education   PT Next Visit Plan Pt is discharged today.   Consulted and Agree with Plan of Care Patient;Family member/caregiver   Family Member Consulted wife, Leia Alf - 03/04/15 01-24-1131    Functional Assessment Tool Used gait velocity 3.78 ft/sec with no device; Functional Gait Assessment score 16>27/30; pt is ambulating with no device   Functional Limitation Mobility: Walking and moving around   Mobility: Walking and Moving Around Goal Status [id number]) At least 20 percent but less than 40 percent impaired, limited or restricted   Mobility: Walking and Moving Around Discharge Status [id number]) At least 20 percent but less than 40 percent impaired, limited or restricted      Problem List Patient Active Problem List  Diagnosis Date Noted  . Parkinson's disease 08/06/2013  . Dyslipidemia 08/06/2013  . S/P mitral valve repair 08/06/2013   PHYSICAL THERAPY DISCHARGE SUMMARY  Visits from Start of Care: 7  Current functional level related to goals / functional outcomes:     PT Long Term Goals - 02/25/15 1683    PT LONG TERM GOAL #5   Title verbalize plans for continued community fitness upon D/C from PT.   Status Achieved   PT LONG TERM GOAL #6   Title Pt will ambulate at least 1000 ft, indoor and outdoor surfaces, modified independently (walking poles as needed) with no episodes of foot catching on ground.   Status Achieved   PT LONG TERM GOAL #7   Title Pt will improve Functional Gait Assessment to at least 20/30 for improved dynamic gait and decreased fall risk.   Baseline Functional Gait Assessment score 27/30   Status Achieved    Long term goals #1-4 had been previously assessed.   Remaining deficits: Posture, bradykinesia, decreased foot clearance with gait.   Education / Equipment: HEP, community  fitness  Plan: Patient agrees to discharge.  Patient goals were not met. Patient is being discharged due to meeting the stated rehab goals.  ?????      Dorisann Schwanke W. 02/25/2015, 11:40 AM  [facility] [address] [place], [place], [address] Phone: [phone]   Fax:  [phone]  Mady Haagensen, [place] 02/25/2015 11:40 AM Phone: [phone] Fax: [phone]

## 2015-02-25 NOTE — Patient Instructions (Signed)
Optimal Fitness Routine: 1.  Exercises Program for Therapy  -Do your PWR! Exercises and posture exercises at least 3-4 days per week  -These are important to offsetting your Parkinson's related deficits  2.  Current Stretching and Exercise Routine  -Daily  3.  Walking for Exercise  -Try to do this daily-remember to have your best posture, arm swing, and clear your feet as you walk, taking big steps  4.  Aerobic Exercise  -Use stationary bike for up to 15-20 minutes, working at intervals slightly harder than you normally would  -lower/comfortable resistance with higher intervals of speed

## 2015-03-04 ENCOUNTER — Ambulatory Visit: Payer: Medicare Other | Admitting: Physical Therapy

## 2015-03-05 ENCOUNTER — Observation Stay (HOSPITAL_COMMUNITY): Payer: Medicare Other

## 2015-03-05 ENCOUNTER — Emergency Department (HOSPITAL_COMMUNITY): Payer: Medicare Other

## 2015-03-05 ENCOUNTER — Encounter (HOSPITAL_COMMUNITY): Payer: Self-pay | Admitting: Emergency Medicine

## 2015-03-05 ENCOUNTER — Observation Stay (HOSPITAL_COMMUNITY)
Admission: EM | Admit: 2015-03-05 | Discharge: 2015-03-07 | Discharge status: Against Medical Advice | Payer: Medicare Other | Attending: Internal Medicine | Admitting: Internal Medicine

## 2015-03-05 DIAGNOSIS — Z8249 Family history of ischemic heart disease and other diseases of the circulatory system: Secondary | ICD-10-CM | POA: Insufficient documentation

## 2015-03-05 DIAGNOSIS — I639 Cerebral infarction, unspecified: Principal | ICD-10-CM | POA: Insufficient documentation

## 2015-03-05 DIAGNOSIS — G20 Parkinson's disease: Secondary | ICD-10-CM | POA: Insufficient documentation

## 2015-03-05 DIAGNOSIS — E785 Hyperlipidemia, unspecified: Secondary | ICD-10-CM | POA: Diagnosis not present

## 2015-03-05 DIAGNOSIS — Z7982 Long term (current) use of aspirin: Secondary | ICD-10-CM | POA: Insufficient documentation

## 2015-03-05 DIAGNOSIS — Z952 Presence of prosthetic heart valve: Secondary | ICD-10-CM | POA: Diagnosis not present

## 2015-03-05 DIAGNOSIS — G459 Transient cerebral ischemic attack, unspecified: Secondary | ICD-10-CM | POA: Diagnosis not present

## 2015-03-05 DIAGNOSIS — Z9889 Other specified postprocedural states: Secondary | ICD-10-CM

## 2015-03-05 DIAGNOSIS — Z8042 Family history of malignant neoplasm of prostate: Secondary | ICD-10-CM | POA: Insufficient documentation

## 2015-03-05 DIAGNOSIS — R4781 Slurred speech: Secondary | ICD-10-CM | POA: Diagnosis present

## 2015-03-05 DIAGNOSIS — G20A1 Parkinson's disease without dyskinesia, without mention of fluctuations: Secondary | ICD-10-CM | POA: Diagnosis present

## 2015-03-05 LAB — DIFFERENTIAL
Basophils Absolute: 0 10*3/uL (ref 0.0–0.1)
Basophils Relative: 0 % (ref 0–1)
EOS ABS: 0.1 10*3/uL (ref 0.0–0.7)
Eosinophils Relative: 2 % (ref 0–5)
LYMPHS PCT: 24 % (ref 12–46)
Lymphs Abs: 1.6 10*3/uL (ref 0.7–4.0)
Monocytes Absolute: 0.7 10*3/uL (ref 0.1–1.0)
Monocytes Relative: 10 % (ref 3–12)
NEUTROS PCT: 64 % (ref 43–77)
Neutro Abs: 4.3 10*3/uL (ref 1.7–7.7)

## 2015-03-05 LAB — COMPREHENSIVE METABOLIC PANEL
ALK PHOS: 59 U/L (ref 39–117)
ALT: 9 U/L (ref 0–53)
AST: 26 U/L (ref 0–37)
Albumin: 4.2 g/dL (ref 3.5–5.2)
Anion gap: 8 (ref 5–15)
BILIRUBIN TOTAL: 0.7 mg/dL (ref 0.3–1.2)
BUN: 25 mg/dL — ABNORMAL HIGH (ref 6–23)
CALCIUM: 9.3 mg/dL (ref 8.4–10.5)
CO2: 28 mmol/L (ref 19–32)
Chloride: 103 mmol/L (ref 96–112)
Creatinine, Ser: 1.05 mg/dL (ref 0.50–1.35)
GFR calc Af Amer: 75 mL/min — ABNORMAL LOW (ref >90)
GFR calc non Af Amer: 65 mL/min — ABNORMAL LOW (ref >90)
Glucose, Bld: 98 mg/dL (ref 70–99)
POTASSIUM: 4.3 mmol/L (ref 3.5–5.1)
Sodium: 139 mmol/L (ref 135–145)
Total Protein: 6.9 g/dL (ref 6.0–8.3)

## 2015-03-05 LAB — PROTIME-INR
INR: 1.02 (ref 0.00–1.49)
Prothrombin Time: 13.5 seconds (ref 11.6–15.2)

## 2015-03-05 LAB — I-STAT CHEM 8, ED
BUN: 31 mg/dL — AB (ref 6–23)
CALCIUM ION: 1.05 mmol/L — AB (ref 1.13–1.30)
CHLORIDE: 104 mmol/L (ref 96–112)
CREATININE: 1.1 mg/dL (ref 0.50–1.35)
GLUCOSE: 96 mg/dL (ref 70–99)
HCT: 49 % (ref 39.0–52.0)
Hemoglobin: 16.7 g/dL (ref 13.0–17.0)
Potassium: 4.3 mmol/L (ref 3.5–5.1)
Sodium: 138 mmol/L (ref 135–145)
TCO2: 21 mmol/L (ref 0–100)

## 2015-03-05 LAB — CBC
HEMATOCRIT: 47 % (ref 39.0–52.0)
Hemoglobin: 15.5 g/dL (ref 13.0–17.0)
MCH: 31.8 pg (ref 26.0–34.0)
MCHC: 33 g/dL (ref 30.0–36.0)
MCV: 96.3 fL (ref 78.0–100.0)
Platelets: 159 10*3/uL (ref 150–400)
RBC: 4.88 MIL/uL (ref 4.22–5.81)
RDW: 14.7 % (ref 11.5–15.5)
WBC: 6.7 10*3/uL (ref 4.0–10.5)

## 2015-03-05 LAB — I-STAT TROPONIN, ED: TROPONIN I, POC: 0.01 ng/mL (ref 0.00–0.08)

## 2015-03-05 LAB — CBG MONITORING, ED: Glucose-Capillary: 101 mg/dL — ABNORMAL HIGH (ref 70–99)

## 2015-03-05 LAB — APTT: APTT: 32 s (ref 24–37)

## 2015-03-05 MED ORDER — ASPIRIN 325 MG PO TABS
325.0000 mg | ORAL_TABLET | Freq: Every day | ORAL | Status: DC
  Administered 2015-03-06: 325 mg via ORAL
  Filled 2015-03-05: qty 1

## 2015-03-05 MED ORDER — ENOXAPARIN SODIUM 40 MG/0.4ML ~~LOC~~ SOLN
40.0000 mg | SUBCUTANEOUS | Status: DC
  Administered 2015-03-06: 40 mg via SUBCUTANEOUS
  Filled 2015-03-05: qty 0.4

## 2015-03-05 MED ORDER — STROKE: EARLY STAGES OF RECOVERY BOOK
Freq: Once | Status: AC
  Administered 2015-03-06: 01:00:00
  Filled 2015-03-05: qty 1

## 2015-03-05 MED ORDER — CARBIDOPA-LEVODOPA ER 48.75-195 MG PO CPCR: 9.0000 | ORAL_CAPSULE | Freq: Two times a day (BID) | ORAL | Status: DC

## 2015-03-05 MED ORDER — AMANTADINE HCL 100 MG PO CAPS
100.0000 mg | ORAL_CAPSULE | Freq: Two times a day (BID) | ORAL | Status: DC
  Administered 2015-03-06: 100 mg via ORAL
  Filled 2015-03-05 (×4): qty 1

## 2015-03-05 MED ORDER — ASPIRIN 300 MG RE SUPP
300.0000 mg | Freq: Every day | RECTAL | Status: DC
  Administered 2015-03-06: 300 mg via RECTAL
  Filled 2015-03-05: qty 1

## 2015-03-05 MED ORDER — ROTIGOTINE 4 MG/24HR TD PT24
1.0000 | MEDICATED_PATCH | Freq: Every day | TRANSDERMAL | Status: DC
  Filled 2015-03-05 (×2): qty 1

## 2015-03-05 MED ORDER — RASAGILINE MESYLATE 1 MG PO TABS
1.0000 mg | ORAL_TABLET | Freq: Every day | ORAL | Status: DC
  Filled 2015-03-05 (×3): qty 1

## 2015-03-05 MED ORDER — ATORVASTATIN CALCIUM 20 MG PO TABS
20.0000 mg | ORAL_TABLET | Freq: Every day | ORAL | Status: DC
  Administered 2015-03-06: 20 mg via ORAL
  Filled 2015-03-05: qty 1
  Filled 2015-03-05: qty 2
  Filled 2015-03-05: qty 1

## 2015-03-05 NOTE — ED Provider Notes (Signed)
CSN: [id number]     Arrival date & time 03/05/15  2024 History   First MD Initiated Contact with Patient 03/05/15 2037     Chief Complaint  Patient presents with  . Facial Droop     (Consider location/radiation/quality/duration/timing/severity/associated sxs/prior Treatment) Patient is a 81 y.o. male presenting with neurologic complaint. The history is provided by the patient.  Neurologic Problem This is a new problem. The current episode started 1 to 2 hours ago. The problem occurs constantly. The problem has been gradually improving. Pertinent negatives include no chest pain, no abdominal pain, no headaches and no shortness of breath. Nothing aggravates the symptoms. Nothing relieves the symptoms. He has tried nothing for the symptoms. The treatment provided significant relief.    Past Medical History  Diagnosis Date  . Parkinson's disease   . S/P mitral valve replacement     mitral regurg, endocarditis  . Dyslipidemia    Past Surgical History  Procedure Laterality Date  . Mitral valve annuloplasty  04/10/2007     26mm Edwards ring; r/t h/o MR and flail mitral leaflets due to endocarditis  . Cataract extraction  2009, 2012    right 2009, left 2012  . Transthoracic echocardiogram  08/15/2012    EF=>55%; normal LV systolic function; RV mildly dilated & systolic function mildly reduced; RA mod dilated; mild -mod MR & mildy increased gradients; mild TR; AV mildly sclerotic with mild-mod regurg   Family History  Problem Relation Age of Onset  . Stroke Mother     cerebral hemorrahge  . Heart attack Mother   . Hypertension Brother   . Valvular heart disease Brother     also MI  . Prostate cancer Father    History  Substance Use Topics  . Smoking status: Never Smoker   . Smokeless tobacco: Never Used  . Alcohol Use: Not on file    Review of Systems  Constitutional: Negative for fever.  HENT: Negative for drooling and rhinorrhea.   Eyes: Negative for pain.  Respiratory:  Negative for cough and shortness of breath.   Cardiovascular: Negative for chest pain and leg swelling.  Gastrointestinal: Negative for nausea, vomiting, abdominal pain and diarrhea.  Genitourinary: Negative for dysuria and hematuria.  Musculoskeletal: Negative for gait problem and neck pain.  Skin: Negative for color change.  Neurological: Positive for facial asymmetry. Negative for numbness and headaches.  Hematological: Negative for adenopathy.  Psychiatric/Behavioral: Negative for behavioral problems.  All other systems reviewed and are negative.     Allergies  Ace inhibitors and Demerol  Home Medications   Prior to Admission medications   Medication Sig Start Date End Date Taking? Authorizing Provider  amantadine (SYMMETREL) 100 MG capsule Take 100 mg by mouth 2 (two) times daily.    Historical Provider, MD  AMOXICILLIN PO Take 500 mg by mouth. Prior to dental procedures    Historical Provider, MD  aspirin 81 MG tablet Take 81 mg by mouth daily.    Historical Provider, MD  atorvastatin (LIPITOR) 20 MG tablet Take 20 mg by mouth daily.    Historical Provider, MD  carbidopa-levodopa (SINEMET CR) 50-200 MG per tablet Take 1 tablet by mouth at bedtime.    Historical Provider, MD  carbidopa-levodopa (SINEMET IR) 25-100 MG per tablet Take 2-2.5 tablets by mouth 4 (four) times daily. 2 tabs in AM - 2.5 tabs 3 more times daily    Historical Provider, MD  Cholecalciferol (VITAMIN D PO) Take by mouth daily.    Historical Provider, MD  Cyanocobalamin (VITAMIN B-12 CR PO) Take by mouth daily.    Historical Provider, MD  Docusate Calcium (STOOL SOFTENER PO) Take by mouth as needed.    Historical Provider, MD  ibandronate (BONIVA) 150 MG tablet Take 1 tablet by mouth every 30 (thirty) days. 07/15/13   Historical Provider, MD  LACTOBACILLUS RHAMNOSUS, GG, PO Take by mouth daily.    Historical Provider, MD  Multiple Vitamins-Minerals (MULTIVITAMIN PO) Take by mouth.    Historical Provider, MD   rasagiline (AZILECT) 1 MG TABS tablet Take 1 mg by mouth daily. Needs to be stopped 14 days prior to surgical procedure.    Historical Provider, MD  rotigotine (NEUPRO) 4 MG/24HR Place 1 patch onto the skin daily.    Historical Provider, MD  VITAMIN E PO Take by mouth daily.    Historical Provider, MD   BP 119/56 mmHg  Pulse 80  Temp(Src) 97.6 F (36.4 C) (Oral)  Resp 22  SpO2 97% Physical Exam  Constitutional: He is oriented to person, place, and time. He appears well-developed and well-nourished.  HENT:  Head: Normocephalic and atraumatic.  Right Ear: External ear normal.  Left Ear: External ear normal.  Nose: Nose normal.  Mouth/Throat: Oropharynx is clear and moist. No oropharyngeal exudate.  Eyes: Conjunctivae and EOM are normal. Pupils are equal, round, and reactive to light.  Neck: Normal range of motion. Neck supple.  Cardiovascular: Normal rate, regular rhythm, normal heart sounds and intact distal pulses.  Exam reveals no gallop and no friction rub.   No murmur heard. Pulmonary/Chest: Effort normal and breath sounds normal. No respiratory distress. He has no wheezes.  Abdominal: Soft. Bowel sounds are normal. He exhibits no distension. There is no tenderness. There is no rebound and no guarding.  Musculoskeletal: Normal range of motion. He exhibits no edema or tenderness.  Neurological: He is alert and oriented to person, place, and time.  alert, oriented x3 speech: normal in context and clarity memory: intact grossly cranial nerves II-XII: Mild right-sided facial droop noted, otherwise cranial nerves intact. motor strength: full proximally and distally no involuntary movements or tremors sensation: intact to light touch diffusely  cerebellar: finger-to-nose and heel-to-shin intact gait: normal forwards and backwards  Skin: Skin is warm and dry.  Psychiatric: He has a normal mood and affect. His behavior is normal.  Nursing note and vitals reviewed.   ED Course   Procedures (including critical care time) Labs Review Labs Reviewed  COMPREHENSIVE METABOLIC PANEL - Abnormal; Notable for the following:    BUN 25 (*)    GFR calc non Af Amer 65 (*)    GFR calc Af Amer 75 (*)    All other components within normal limits  CBG MONITORING, ED - Abnormal; Notable for the following:    Glucose-Capillary 101 (*)    All other components within normal limits  I-STAT CHEM 8, ED - Abnormal; Notable for the following:    BUN 31 (*)    Calcium, Ion 1.05 (*)    All other components within normal limits  PROTIME-INR  APTT  CBC  DIFFERENTIAL  HEMOGLOBIN A1C  LIPID PANEL  I-STAT TROPOININ, ED    Imaging Review Ct Head (brain) Wo Contrast  03/05/2015   CLINICAL DATA:  Right-sided facial droop and slurred speech.  EXAM: CT HEAD WITHOUT CONTRAST  TECHNIQUE: Contiguous axial images were obtained from the base of the skull through the vertex without intravenous contrast.  COMPARISON:  None.  FINDINGS: Age related cerebral atrophy, ventriculomegaly and periventricular  white matter disease. There is a remote appearing lacunar type basal ganglia infarct on the right side. No CT findings for acute hemispheric infarction or intracranial hemorrhage. No mass lesions. No extra-axial fluid collections. The brainstem and cerebellum are grossly normal.  No significant bony findings. The paranasal sinuses and mastoid air cells are clear. The globes are intact.  IMPRESSION: Age related cerebral atrophy, ventriculomegaly and periventricular white matter disease.  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  No findings for acute hemispheric infarction or intracranial hemorrhage.   Electronically Signed   By: Rudie Meyer M.D.   On: 03/05/2015 21:02     EKG Interpretation   Date/Time:  Wednesday March 05 2015 20:34:20 EDT Ventricular Rate:  80 PR Interval:  220 QRS Duration: 99 QT Interval:  371 QTC Calculation: 428 R Axis:   56 Text  Interpretation:  Sinus rhythm Multiform ventricular premature  complexes Prolonged PR interval Borderline T wave abnormalities Otherwise  no significant change Confirmed by [name]  MD, Devann Cribb ([id number]) on  03/05/2015 9:23:35 PM      MDM   Final diagnoses:  Transient cerebral ischemia, unspecified transient cerebral ischemia type    8:52 PM 81 y.o. male w hx of parkinsons, s/p mitral valve replacement who presents with right-sided facial droop, expressive aphasia, and slurred speech which began at approximately 7:30 PM this evening while eating dinner. His symptoms seem to have slowly resolved since then. He is alert and oriented 3 and does not appear to have any more slurring of speech or expressive issues. He does continue to have some mild persistent right-sided facial droop. Code stroke initially called but then canceled after I discussed the case with Dr. Roseanne Reno with neurology. The patient is afebrile and vital signs are unremarkable here. He otherwise has a normal neurologic exam.  The patient continues to appear on exam. Will admit to the hospitalist for TIA workup.  Purvis Sheffield, MD 03/05/15 [phone]

## 2015-03-05 NOTE — ED Notes (Signed)
Notified RN,Lisa pt. CBG 101.

## 2015-03-05 NOTE — H&P (Signed)
[facility] History and Physical  Patient: [name] [name]  MRN: [id number]  DOB: 28-Jan-1932  DOS: the patient was seen and examined on 03/05/2015 PCP: Ezequiel Kayser, MD  Chief Complaint: Slurred speech  HPI: [name] [name] is a 81 y.o. male with Past medical history of Parkinson's disease, mitral valve repair, dyslipidemia. The patient is presenting with complaints of speech difficulty. Patient was at a restaurant eating with his wife and suddenly started having drooling of saliva from the angle of the left side of his mouth. Neck slight later on he started having difficulty speaking with slurred speech. He did not have any dizziness or lightheadedness or headache or blurred vision or chest pain or shortness of breath or abdominal pain. He denies having any nausea vomiting diarrhea constipation or burning urination recently. He also denies any recent change in his medications. He denies any similar symptoms in the past. He mentions he has been compliant with all his medications. He also had occasional weakness of his right arm prior to his arrival. At the time of my evaluation he mentions his speech has cleared but he continues to have droopiness of the left angle of the mouth.  The patient is coming from home. And at his baseline independent for most of his ADL.  Review of Systems: as mentioned in the history of present illness.  A comprehensive review of the other systems is negative.  Past Medical History  Diagnosis Date  . Parkinson's disease   . S/P mitral valve replacement     mitral regurg, endocarditis  . Dyslipidemia    Past Surgical History  Procedure Laterality Date  . Mitral valve annuloplasty  04/10/2007     26mm Edwards ring; r/t h/o MR and flail mitral leaflets due to endocarditis  . Cataract extraction  2009, 2012    right 2009, left 2012  . Transthoracic echocardiogram  08/15/2012    EF=>55%; normal LV systolic function; RV mildly dilated & systolic function mildly  reduced; RA mod dilated; mild -mod MR & mildy increased gradients; mild TR; AV mildly sclerotic with mild-mod regurg   Social History:  reports that he has never smoked. He has never used smokeless tobacco. He reports that he does not use illicit drugs. His alcohol history is not on file.  Allergies  Allergen Reactions  . Ace Inhibitors Cough  . Demerol [Meperidine]     unknown    Family History  Problem Relation Age of Onset  . Stroke Mother     cerebral hemorrahge  . Heart attack Mother   . Hypertension Brother   . Valvular heart disease Brother     also MI  . Prostate cancer Father     Prior to Admission medications   Medication Sig Start Date End Date Taking? Authorizing Provider  acetaminophen (TYLENOL) 325 MG tablet Take by mouth every 6 (six) hours as needed for headache.   Yes Historical Provider, MD  amantadine (SYMMETREL) 100 MG capsule Take 100 mg by mouth 2 (two) times daily.   Yes Historical Provider, MD  aspirin 81 MG tablet Take 81 mg by mouth every evening.    Yes Historical Provider, MD  atorvastatin (LIPITOR) 20 MG tablet Take 20 mg by mouth daily.   Yes Historical Provider, MD  Cholecalciferol (VITAMIN D PO) Take by mouth daily.   Yes Historical Provider, MD  Cyanocobalamin (VITAMIN B-12 CR PO) Take by mouth daily.   Yes Historical Provider, MD  Docusate Calcium (STOOL SOFTENER PO) Take by mouth as needed.  Yes Historical Provider, MD  Multiple Vitamins-Minerals (MULTIVITAMIN PO) Take by mouth.   Yes Historical Provider, MD  rasagiline (AZILECT) 1 MG TABS tablet Take 1 mg by mouth every morning. .   Yes Historical Provider, MD  rotigotine (NEUPRO) 4 MG/24HR Place 1 patch onto the skin daily.   Yes Historical Provider, MD  RYTARY 48.75-195 MG CPCR Take 9 capsules by mouth See admin instructions. 3 caps in the morning with Azelect, 3 caps at 1pm with Amantadine, 2 caps at 6pm with Amantadine and 1 cap at bedtime. 02/18/15  Yes Historical Provider, MD  VITAMIN E PO  Take by mouth daily.   Yes Historical Provider, MD  AMOXICILLIN PO Take 500 mg by mouth. Prior to dental procedures    Historical Provider, MD  ibandronate (BONIVA) 150 MG tablet Take 1 tablet by mouth every 30 (thirty) days. 07/15/13   Historical Provider, MD    Physical Exam: Filed Vitals:   03/05/15 2035 03/05/15 2235  BP: 119/56 130/90  Pulse: 80 64  Temp: 97.6 F (36.4 C) 97.8 F (36.6 C)  TempSrc: Oral Oral  Resp: 22 16  SpO2: 97% 97%    General: Alert, Awake and Oriented to Time, Place and Person. Appear in mild distress Eyes: PERRL ENT: Oral Mucosa clear moist. Neck: no JVD Cardiovascular: S1 and S2 Present, no Murmur, Peripheral Pulses Present Respiratory: Bilateral Air entry equal and Decreased,  Clear to Auscultation, no Crackles, no wheezes Abdomen: Bowel Sound present, Soft and non tender Skin: no Rash Extremities: no Pedal edema, no calf tenderness Neurologic: Mental status AAOx3, speech normal, attention normal,  Cranial Nerves PERRL, EOM normal and present,  Droopiness of the left angle of the mouth, feels like numbness on the inside of the mouth. Motor strength bilateral equal strength 5/5,  Sensation present to light touch,  reflexes present knee and biceps, babinski negative,  Cerebellar test normal finger nose finger.   Labs on Admission:  CBC:  Recent Labs Lab 03/05/15 2041 03/05/15 2046  WBC 6.7  --   NEUTROABS 4.3  --   HGB 15.5 16.7  HCT 47.0 49.0  MCV 96.3  --   PLT 159  --     CMP     Component Value Date/Time   NA 138 03/05/2015 2046   K 4.3 03/05/2015 2046   CL 104 03/05/2015 2046   CO2 28 03/05/2015 2041   GLUCOSE 96 03/05/2015 2046   BUN 31* 03/05/2015 2046   CREATININE 1.10 03/05/2015 2046   CALCIUM 9.3 03/05/2015 2041   PROT 6.9 03/05/2015 2041   ALBUMIN 4.2 03/05/2015 2041   AST 26 03/05/2015 2041   ALT 9 03/05/2015 2041   ALKPHOS 59 03/05/2015 2041   BILITOT 0.7 03/05/2015 2041   GFRNONAA 65* 03/05/2015 2041   GFRAA  75* 03/05/2015 2041    No results for input(s): LIPASE, AMYLASE in the last 168 hours.  No results for input(s): CKTOTAL, CKMB, CKMBINDEX, TROPONINI in the last 168 hours. BNP (last 3 results) No results for input(s): BNP in the last 8760 hours.  ProBNP (last 3 results) No results for input(s): PROBNP in the last 8760 hours.   Radiological Exams on Admission: Ct Head (brain) Wo Contrast  03/05/2015   CLINICAL DATA:  Right-sided facial droop and slurred speech.  EXAM: CT HEAD WITHOUT CONTRAST  TECHNIQUE: Contiguous axial images were obtained from the base of the skull through the vertex without intravenous contrast.  COMPARISON:  None.  FINDINGS: Age related cerebral atrophy, ventriculomegaly  and periventricular white matter disease. There is a remote appearing lacunar type basal ganglia infarct on the right side. No CT findings for acute hemispheric infarction or intracranial hemorrhage. No mass lesions. No extra-axial fluid collections. The brainstem and cerebellum are grossly normal.  No significant bony findings. The paranasal sinuses and mastoid air cells are clear. The globes are intact.  IMPRESSION: Age related cerebral atrophy, ventriculomegaly and periventricular white matter disease.  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  No findings for acute hemispheric infarction or intracranial hemorrhage.   Electronically Signed   By: Rudie Meyer M.D.   On: 03/05/2015 21:02   EKG: Independently reviewed. normal sinus rhythm, nonspecific ST and T waves changes.  Assessment/Plan Principal Problem:   TIA (transient ischemic attack) Active Problems:   Parkinson's disease   Dyslipidemia   S/P mitral valve repair   Slurred speech   1. TIA (transient ischemic attack) The patient is presenting with complaints of droopiness of the mouth and slurred speech. The speech has resolved. His exam shows Droopiness of the left angle of the mouth, feels like numbness  on the inside of the mouth. CT of the head is negative for any acute abnormality. Patient was initially discussed with neurology on phone as a code stroke. As his symptoms were improving and limited neurology decided that the patient is not a candidate for TPA. Due to his slurred speech she will be kept nothing by mouth overnight. He will receive aspirin suppository. PTOT and neurology evaluation will be done in [facility]. Patient will be monitored on telemetry. Will get chest x-ray and MRI brain echocardiogram carotid Doppler.  2. History of Parkinson's disease. Holding medications overnight of his remaining in the morning.  3. History of dyslipidemia. Checking fasting lipid profile in the morning.  4. History of mitral valve repair as well as mild aortic stenosis. Echocardiogram in the morning.  Advance goals of care discussion: Full code   Consults: Neurology  DVT Prophylaxis: subcutaneous Heparin Nutrition: Nothing by mouth  Family Communication: family was present at bedside, opportunity was given to ask question and all questions were answered satisfactorily at the time of interview. Disposition: Admitted as observation, telemetry unit.  Author: Lynden Oxford, MD [facility] Pager: [phone] 03/05/2015  If 7PM-7AM, please contact night-coverage [email or web address] Password [email or web address]

## 2015-03-05 NOTE — ED Notes (Signed)
Patient presents POV for right sided facial droop. Patient A&O, ambulatory to bed. Per wife, patient last seen at baseline approximately 1930. Patient with mild dysphasia and mild weakness in RUE. Equal bilateral grips, no drift, denies pain, denies visual changes. Wife at bedside.

## 2015-03-05 NOTE — ED Notes (Signed)
Pt. On cardiac monitor. 

## 2015-03-05 NOTE — ED Notes (Signed)
Patient transported to CT 

## 2015-03-05 NOTE — ED Notes (Signed)
Admitting MD at bedside.

## 2015-03-05 NOTE — ED Notes (Addendum)
EKG given to EDP, Harrison,MD., for review. 

## 2015-03-06 ENCOUNTER — Observation Stay (HOSPITAL_COMMUNITY): Payer: Medicare Other

## 2015-03-06 ENCOUNTER — Encounter (HOSPITAL_COMMUNITY): Payer: Self-pay | Admitting: Radiology

## 2015-03-06 DIAGNOSIS — G459 Transient cerebral ischemic attack, unspecified: Secondary | ICD-10-CM

## 2015-03-06 DIAGNOSIS — R4781 Slurred speech: Secondary | ICD-10-CM | POA: Diagnosis not present

## 2015-03-06 DIAGNOSIS — I63412 Cerebral infarction due to embolism of left middle cerebral artery: Secondary | ICD-10-CM

## 2015-03-06 DIAGNOSIS — E785 Hyperlipidemia, unspecified: Secondary | ICD-10-CM | POA: Diagnosis not present

## 2015-03-06 DIAGNOSIS — Z9889 Other specified postprocedural states: Secondary | ICD-10-CM | POA: Diagnosis not present

## 2015-03-06 DIAGNOSIS — G20 Parkinson's disease: Secondary | ICD-10-CM | POA: Diagnosis not present

## 2015-03-06 DIAGNOSIS — I639 Cerebral infarction, unspecified: Secondary | ICD-10-CM | POA: Insufficient documentation

## 2015-03-06 LAB — GLUCOSE, CAPILLARY
GLUCOSE-CAPILLARY: 119 mg/dL — AB (ref 70–99)
Glucose-Capillary: 108 mg/dL — ABNORMAL HIGH (ref 70–99)

## 2015-03-06 MED ORDER — CARBIDOPA-LEVODOPA ER 48.75-195 MG PO CPCR: 3.0000 | ORAL_CAPSULE | Freq: Two times a day (BID) | ORAL | Status: DC

## 2015-03-06 MED ORDER — CARBIDOPA-LEVODOPA ER 48.75-195 MG PO CPCR: 1.0000 | ORAL_CAPSULE | ORAL | Status: AC

## 2015-03-06 MED ORDER — CARBIDOPA-LEVODOPA ER 48.75-195 MG PO CPCR
2.0000 | ORAL_CAPSULE | ORAL | Status: AC
  Administered 2015-03-06: 2 via ORAL

## 2015-03-06 NOTE — Evaluation (Signed)
Physical Therapy Evaluation Patient Details Name: [name] [name] MRN: [id number] DOB: 09/26/1932 Today's Date: 03/06/2015   History of Present Illness  [name] HensenJohn [name] is an 81 y.o. male with a history of Parkinson's disease, mitral valve replacement and dyslipidemia, who developed acute onset of right facial droop and speech output difficulty at 7:30 PM on 03/05/2015. There's no previous documentation of stroke nor TIA. 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  Clinical Impression  Pt admitted with the above diagnosis. Pt currently with functional limitations due to the deficits listed below (see PT Problem List). Ambulates generally well without need for an assistive device. Tolerated dynamic gait challenges without loss of balance. Attends Parkinson's physical training classes weekly and has strong family support. Per patient and wife, he is apparently at his baseline with physical functioning. Will continue to follow and monitor for changes in functional status, however feel that he is adequate for d/c from a mobility standpoint when patient is cleared medically.   Follow Up Recommendations No PT follow up (Return to Parkinson's activity classes)    Equipment Recommendations  None recommended by PT    Recommendations for Other Services       Precautions / Restrictions Precautions Precautions: Fall Restrictions Weight Bearing Restrictions: No      Mobility  Bed Mobility Overal bed mobility: Modified Independent             General bed mobility comments: requires extra time  Transfers Overall transfer level: Needs assistance Equipment used: None Transfers: Sit to/from Stand Sit to Stand: Supervision         General transfer comment: supervision for safety. Mild sway noted but no overt loss of balance. Improved after standing for approx 15 sec.  Ambulation/Gait Ambulation/Gait assistance: Supervision Ambulation Distance (Feet):  525 Feet Assistive device: None Gait Pattern/deviations: Step-through pattern;Decreased stride length;Narrow base of support;Trunk flexed   Gait velocity interpretation: at or above normal speed for age/gender General Gait Details: Patient ambulates with typical parkinsonian type gait but is well compensated. Tolerated dynamic gait challenges including high marching, quick turns, vertical and horizontal head turns, and backwards stepping. All performed without loss of balance. Cues for forward gaze and upright posture.   Stairs Stairs: Yes Stairs assistance: Supervision Stair Management: No rails;Alternating pattern;Forwards Number of Stairs: 3 (x2) General stair comments: No assist needed. safely cleares all steps without need for rail.  Wheelchair Mobility    Modified Rankin (Stroke Patients Only) Modified Rankin (Stroke Patients Only) Pre-Morbid Rankin Score: No significant disability Modified Rankin: No significant disability     Balance Overall balance assessment: Needs assistance Sitting-balance support: No upper extremity supported;Feet supported Sitting balance-Leahy Scale: Good     Standing balance support: No upper extremity supported Standing balance-Leahy Scale: Good                               Pertinent Vitals/Pain Pain Assessment: No/denies pain    Home Living Family/patient expects to be discharged to:: Private residence Living Arrangements: Spouse/significant other Available Help at Discharge: Family;Available 24 hours/day Type of Home: House Home Access: Stairs to enter Entrance Stairs-Rails: None Entrance Stairs-Number of Steps: 1 Home Layout: Two level;Able to live on main level with bedroom/bathroom Home Equipment: Gilmer MorCane - single point;Other (comment);Walker - 4 wheels;Tub bench (walking sticks)      Prior Function Level of Independence: Independent  Hand Dominance   Dominant Hand:  (both)    Extremity/Trunk  Assessment   Upper Extremity Assessment: Defer to OT evaluation           Lower Extremity Assessment: Overall WFL for tasks assessed         Communication   Communication: No difficulties  Cognition Arousal/Alertness: Awake/alert Behavior During Therapy: WFL for tasks assessed/performed Overall Cognitive Status: Within Functional Limits for tasks assessed                      General Comments General comments (skin integrity, edema, etc.): Family reports patient attends a weekly parkinson's class for physical training. He denies any changes in his baseline balance or strength. Rt facial drop still present    Exercises        Assessment/Plan    PT Assessment Patient needs continued PT services  PT Diagnosis Abnormality of gait   PT Problem List Decreased range of motion;Decreased balance;Decreased mobility  PT Treatment Interventions DME instruction;Gait training;Functional mobility training;Therapeutic activities;Therapeutic exercise;Balance training;Neuromuscular re-education;Patient/family education   PT Goals (Current goals can be found in the Care Plan section) Acute Rehab PT Goals Patient Stated Goal: Go home as soon as possible PT Goal Formulation: With patient/family Time For Goal Achievement: 03/20/15 Potential to Achieve Goals: Good    Frequency Min 4X/week   Barriers to discharge        Co-evaluation               End of Session Equipment Utilized During Treatment: Gait belt Activity Tolerance: Patient tolerated treatment well Patient left: in chair;with call bell/phone within reach;with chair alarm set;with family/visitor present Nurse Communication: Mobility status    Functional Assessment Tool Used: clinical observation Functional Limitation: Mobility: Walking and moving around Mobility: Walking and Moving Around Current Status [phone]): At least 1 percent but less than 20 percent impaired, limited or restricted Mobility: Walking  and Moving Around Goal Status [phone]): At least 1 percent but less than 20 percent impaired, limited or restricted    Time: 0981-1914 PT Time Calculation (min) (ACUTE ONLY): 20 min   Charges:   PT Evaluation $Initial PT Evaluation Tier I: 1 Procedure     PT G Codes:   PT G-Codes **NOT FOR INPATIENT CLASS** Functional Assessment Tool Used: clinical observation Functional Limitation: Mobility: Walking and moving around Mobility: Walking and Moving Around Current Status ([id number]): At least 1 percent but less than 20 percent impaired, limited or restricted Mobility: Walking and Moving Around Goal Status [phone]): At least 1 percent but less than 20 percent impaired, limited or restricted    Berton Mount 03/06/2015, 11:00 AM  Charlsie Merles, [place] [phone]

## 2015-03-06 NOTE — Progress Notes (Signed)
I came to discuss TEE with the pt. The pt's wife thinks there may be a reason he doesn't need this based on his dopplers, ?. I will make him NPO but have not written orders yet. Call placed to on call neurologist (stoke team is gone now).   Corine ShelterLUKE Tyese Finken PA-C 03/06/2015 5:08 PM

## 2015-03-06 NOTE — Progress Notes (Signed)
STROKE TEAM PROGRESS NOTE   HISTORY [name] [name] is an 81 y.o. male with a history of Parkinson's disease, mitral valve replacement and dyslipidemia, who developed acute onset of right facial droop and speech output difficulty at 7:30 PM on 03/05/2015 ([facility]). There's no previous documentation of stroke nor TIA. Patient has been on aspirin for antiplatelet therapy. 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 PH improved to baseline in the emergency room. Mild right facial droop persisted. NIH stroke score at time of this evaluation was 2. MRI showed an area of abnormal signal on diffusion-weighted imaging negative of probable left posterior frontal small cortical acute infarction. Patient was not administered TPA secondary to rapidly resolving deficits. He was admitted for further evaluation and treatment.   SUBJECTIVE (INTERVAL HISTORY) His wife is at the bedside.  Overall he feels his condition is rapidly improving. Patient is sitting up in the chair at the bedside. States they were eating at [facility] last night when it started, he was not able to talk but he understands everything with right facial droop and drooling. He stated that "I was so embarrassed."      OBJECTIVE Temp:  [97.6 F (36.4 C)-98 F (36.7 C)] 97.7 F (36.5 C) (04/14 1154) Pulse Rate:  [63-80] 67 (04/14 1154) Cardiac Rhythm:  [-] Heart block (04/14 0000) Resp:  [16-22] 16 (04/14 1154) BP: (103-130)/(56-90) 103/77 mmHg (04/14 1154) SpO2:  [94 %-99 %] 95 % (04/14 1154) Weight:  [77.2 kg (170 lb 3.1 oz)] 77.2 kg (170 lb 3.1 oz) (04/14 0015)   Recent Labs Lab 03/05/15 2042 03/06/15 0031 03/06/15 1143  GLUCAP 101* 108* 119*    Recent Labs Lab 03/05/15 2041 03/05/15 2046  NA 139 138  K 4.3 4.3  CL 103 104  CO2 28  --   GLUCOSE 98 96  BUN 25* 31*  CREATININE 1.05 1.10  CALCIUM 9.3  --     Recent Labs Lab 03/05/15 2041  AST 26  ALT 9  ALKPHOS 59  BILITOT 0.7   PROT 6.9  ALBUMIN 4.2    Recent Labs Lab 03/05/15 2041 03/05/15 2046  WBC 6.7  --   NEUTROABS 4.3  --   HGB 15.5 16.7  HCT 47.0 49.0  MCV 96.3  --   PLT 159  --    No results for input(s): CKTOTAL, CKMB, CKMBINDEX, TROPONINI in the last 168 hours.  Recent Labs  03/05/15 2041  LABPROT 13.5  INR 1.02   No results for input(s): COLORURINE, LABSPEC, PHURINE, GLUCOSEU, HGBUR, BILIRUBINUR, KETONESUR, PROTEINUR, UROBILINOGEN, NITRITE, LEUKOCYTESUR in the last 72 hours.  Invalid input(s): APPERANCEUR  No results found for: CHOL, TRIG, HDL, CHOLHDL, VLDL, LDLCALC No results found for: HGBA1C No results found for: LABOPIA, COCAINSCRNUR, LABBENZ, AMPHETMU, THCU, LABBARB  No results for input(s): ETH in the last 168 hours.  I have personally reviewed the radiological images below and agree with the radiology interpretations.  Dg Chest 2 View 03/05/2015    Stable exam.  No active cardiopulmonary disease.   Ct Head (brain) Wo Contrast 03/05/2015    Age related cerebral atrophy, ventriculomegaly and periventricular white matter disease.  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  No findings for acute hemispheric infarction or intracranial hemorrhage.    MRI HEAD 03/06/2015   1. Subtly increased 5 mm focus of high signal intensity within the posterior left frontal lobe on DWI sequence as above. Finding is favored  to be artifactual in nature, although possible small acute ischemic infarct is not entirely excluded. Correlation with physical exam and history for possible acute ischemia in this territory is recommended. 2. No other acute intracranial infarct or other abnormality identified. 3. Small remote anterior right frontal and posterior left frontal cortical infarcts as above. 4. Generalized cerebral atrophy.    MRA HEAD 03/06/2015    Unremarkable MRA of the intracranial circulation without proximal branch occlusion or hemodynamically significant stenosis.     CUS - Preliminary findings: Bilateral: 1-39% ICA stenosis. Vertebral artery flow is antegrade.   2D echo - pending  TEE - pending  LE venous doppler - pending  PHYSICAL EXAM  Temp:  [97.6 F (36.4 C)-98 F (36.7 C)] 97.8 F (36.6 C) (04/14 1328) Pulse Rate:  [63-80] 68 (04/14 1328) Resp:  [16-22] 18 (04/14 1328) BP: (103-130)/(56-90) 115/65 mmHg (04/14 1328) SpO2:  [94 %-99 %] 98 % (04/14 1328) Weight:  [170 lb 3.1 oz (77.2 kg)] 170 lb 3.1 oz (77.2 kg) (04/14 0015)  General - Well nourished, well developed, in no apparent distress.  Ophthalmologic - Sharp disc margins OU.  Cardiovascular - Regular rate and rhythm with no murmur.  Mental Status -  Level of arousal and orientation to time, place, and person were intact. Language including expression, naming, repetition, comprehension was assessed and found intact. Fund of Knowledge was assessed and was intact.  Cranial Nerves II - XII - II - Visual field intact OU. III, IV, VI - Extraocular movements intact. V - Facial sensation intact bilaterally. VII - Facial movement intact bilaterally, however, still obvious right nasolabial fold flattening. VIII - Hearing & vestibular intact bilaterally. X - Palate elevates symmetrically. XI - Chin turning & shoulder shrug intact bilaterally. XII - Tongue protrusion intact.  Motor Strength - The patient's strength was normal in all extremities and pronator drift was absent.  Bulk was normal and fasciculations were absent.   Motor Tone - Muscle tone was assessed at the neck and appendages and was normal.  Reflexes - The patient's reflexes were 1+ in all extremities and he had no pathological reflexes.  Sensory - Light touch, temperature/pinprick were assessed and were symmetrical.    Coordination - The patient had normal movements in the hands and feet with no ataxia or dysmetria.  Tremor was absent.  Gait and Station - deferred as pt is eating lunch.  ASSESSMENT/PLAN Mr.  Cherly HensenJohn Rajkumar is a 81 y.o. male with history of Parkinson's disease, mitral valve replacement and dyslipidemia, who developed acute onset of right facial droop and speech output difficulty. He did not receive IV t-PA due to rapidly resolving deficits.   Stroke:  left frontal infarct, cortical location, felt to be embolic secondary to unknown source.  Resultant  Mild right facial weakness, dysarthria  MRI  L frontal cortical small and subtle infarct  MRA  Unremarkable   Carotid Doppler  unremarkable   2D Echo  pending   LE venous doppler pending If carotid doppler without significant plaque, will check TEE to look for embolic source. Arranged with [facility] for tomorrow.  If positive for PFO (patent foramen ovale), check bilateral lower extremity venous dopplers to rule out DVT as possible source of stroke. (I have made patient NPO after midnight tonight).  If TEE negative, [facility] will consult and consider placement of an implantable loop recorder to evaluate for atrial fibrillation as etiology of stroke. This has been explained  to patient/family by Dr. Roda Shutters and they are agreeable.   LDL not drawn. ordered  HgbA1c not drawn, have ordered  Lovenox 40 mg sq daily for VTE prophylaxis Diet Heart Room service appropriate?: Yes; Fluid consistency:: Thin  aspirin 81 mg orally every day prior to admission, now on aspirin 325 mg orally every day  Ongoing aggressive stroke risk factor management  Therapy recommendations:  Pending. None anticipated  Disposition:  Anticipate return home with wife  Hyperlipidemia  Home meds:  lipitor 20, resumed in hospital  LDL not drawn, ordered, goal < 70  Continue statin at discharge  Other Stroke Risk Factors  Advanced age  Family hx stroke (mother, hemorrhage)  Other Active Problems  Parkinson's disease - following up with [facility] - on sinement, azilect and  neupro  Hospital day #   [facility] See Amion for Pager information 03/06/2015 3:23 PM   I, the attending vascular neurologist, have personally obtained a history, examined the patient, evaluated laboratory data, individually viewed imaging studies and agree with radiology interpretations. I also obtained additional history from pt's wife at bedside. Together with the NP/PA, we formulated the assessment and plan of care which reflects our mutual decision.  I have made any additions or clarifications directly to the above note and agree with the findings and plan as currently documented.   81 yo M with PD, MVR in 2008, HLD presented with right facial droop and expressive aphasia, rapid improving. Stroke work up so far negative except MRI showed left frontal cortical small and subtle infarct. Concerning for initial large clot breaking off with symptoms improving. With hx of MVR, we recommend TEE and loop recorder if indicated. Await also DVT screening, LDL and A1C. Continue follow up with [facility] for PD.   Marvel Plan, MD PhD Stroke Neurology 03/06/2015 5:22 PM       To contact Stroke Continuity provider, please refer to [email or web address]. After hours, contact General Neurology

## 2015-03-06 NOTE — Progress Notes (Signed)
*  PRELIMINARY RESULTS* Vascular Ultrasound Carotid Duplex (Doppler) has been completed.  Preliminary findings: Bilateral:  1-39% ICA stenosis.  Vertebral artery flow is antegrade.      Farrel DemarkJill Eunice, RDMS, RVT  03/06/2015, 2:28 PM

## 2015-03-06 NOTE — Consult Note (Signed)
Admission H&P    Chief Complaint: Acute onset of right facial droop and speech output difficulty.  HPI: [name] [name] is an 81 y.o. male with a history of Parkinson's disease, mitral valve replacement and dyslipidemia, who developed acute onset of right facial droop and speech output difficulty at 7:30 PM on 03/05/2015. There's no previous documentation of stroke nor TIA. Patient has been on aspirin for antiplatelet therapy. 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 PH improved to baseline in the emergency room. Mild right facial droop persisted. NIH stroke score at time of this evaluation was 2. MRI showed an area of abnormal signal on diffusion-weighted imaging negative of probable left posterior frontal small cortical acute infarction.  LSN: 7:30 PM on 03/05/2015 tPA Given: No: Rapidly resolving deficits mRankin:  Past Medical History  Diagnosis Date  . Parkinson's disease   . S/P mitral valve replacement     mitral regurg, endocarditis  . Dyslipidemia     Past Surgical History  Procedure Laterality Date  . Mitral valve annuloplasty  04/10/2007     70m Edwards ring; r/t h/o MR and flail mitral leaflets due to endocarditis: MRI safe  . Cataract extraction  2009, 2012    right 2009, left 2012  . Transthoracic echocardiogram  08/15/2012    EF=>55%; normal LV systolic function; RV mildly dilated & systolic function mildly reduced; RA mod dilated; mild -mod MR & mildy increased gradients; mild TR; AV mildly sclerotic with mild-mod regurg    Family History  Problem Relation Age of Onset  . Stroke Mother     cerebral hemorrahge  . Heart attack Mother   . Hypertension Brother   . Valvular heart disease Brother     also MI  . Prostate cancer Father    Social History:  reports that he has never smoked. He has never used smokeless tobacco. He reports that he does not use illicit drugs. His alcohol history is not on file.  Allergies:   Allergies  Allergen Reactions  . Ace Inhibitors Cough  . Demerol [Meperidine]     unknown    Medications Prior to Admission  Medication Sig Dispense Refill  . acetaminophen (TYLENOL) 325 MG tablet Take by mouth every 6 (six) hours as needed for headache.    .Marland Kitchenamantadine (SYMMETREL) 100 MG capsule Take 100 mg by mouth 2 (two) times daily.    .Marland Kitchenaspirin 81 MG tablet Take 81 mg by mouth every evening.     .Marland Kitchenatorvastatin (LIPITOR) 20 MG tablet Take 20 mg by mouth daily.    . Cholecalciferol (VITAMIN D PO) Take by mouth daily.    . Cyanocobalamin (VITAMIN B-12 CR PO) Take by mouth daily.    .Mariane BaumgartenCalcium (STOOL SOFTENER PO) Take by mouth as needed.    . Multiple Vitamins-Minerals (MULTIVITAMIN PO) Take by mouth.    . rasagiline (AZILECT) 1 MG TABS tablet Take 1 mg by mouth every morning. .    . rotigotine (NEUPRO) 4 MG/24HR Place 1 patch onto the skin daily.    .Marland KitchenRYTARY 48.75-195 MG CPCR Take 9 capsules by mouth See admin instructions. 3 caps in the morning with Azelect, 3 caps at 1pm with Amantadine, 2 caps at 6pm with Amantadine and 1 cap at bedtime.    .Marland KitchenVITAMIN E PO Take by mouth daily.    . AMOXICILLIN PO Take 500 mg by mouth. Prior to dental procedures    . ibandronate (BONIVA)  150 MG tablet Take 1 tablet by mouth every 30 (thirty) days.      ROS: History obtained from the patient  General ROS: negative for - chills, fatigue, fever, night sweats, weight gain or weight loss Psychological ROS: negative for - behavioral disorder, hallucinations, memory difficulties, mood swings or suicidal ideation Ophthalmic ROS: negative for - blurry vision, double vision, eye pain or loss of vision ENT ROS: negative for - epistaxis, nasal discharge, oral lesions, sore throat, tinnitus or vertigo Allergy and Immunology ROS: negative for - hives or itchy/watery eyes Hematological and Lymphatic ROS: negative for - bleeding problems, bruising or swollen lymph nodes Endocrine ROS: negative for -  galactorrhea, hair pattern changes, polydipsia/polyuria or temperature intolerance Respiratory ROS: negative for - cough, hemoptysis, shortness of breath or wheezing Cardiovascular ROS: negative for - chest pain, dyspnea on exertion, edema or irregular heartbeat Gastrointestinal ROS: negative for - abdominal pain, diarrhea, hematemesis, nausea/vomiting or stool incontinence Genito-Urinary ROS: negative for - dysuria, hematuria, incontinence or urinary frequency/urgency Musculoskeletal ROS: negative for - joint swelling or muscular weakness Neurological ROS: as noted in HPI Dermatological ROS: negative for rash and skin lesion changes  Physical Examination: Blood pressure 120/68, pulse 68, temperature 97.9 F (36.6 C), temperature source Oral, resp. rate 19, height 6' (1.829 m), weight 77.2 kg (170 lb 3.1 oz), SpO2 96 %.  HEENT-  Normocephalic, no lesions, without obvious abnormality.  Normal external eye and conjunctiva.  Normal TM's bilaterally.  Normal auditory canals and external ears. Normal external nose, mucus membranes and septum.  Normal pharynx. Neck supple with no masses, nodes, nodules or enlargement. Cardiovascular - regularly irregular rhythm and systolic murmur: early systolic 2/6, crescendo at 2nd left intercostal space Lungs - chest clear, no wheezing, rales, normal symmetric air entry Abdomen - soft, non-tender; bowel sounds normal; no masses,  no organomegaly Extremities - no joint deformities, effusion, or inflammation, no edema and no skin discoloration  Neurologic Examination: Mental Status: Alert, oriented, thought content appropriate.  Speech moderately dysarthric without evidence of aphasia. Able to follow commands without difficulty. Cranial Nerves: II-Visual fields were normal. III/IV/VI-Pupils were equal and reacted. Extraocular movements were full and conjugate.    V/VII-no facial numbness; mild right lower facial weakness. VIII-normal. X-moderate dysarthria;  symmetrical palatal movement. XI: trapezius strength/neck flexion strength normal bilaterally XII-midline tongue extension with normal strength. Motor: 5/5 strength bilaterally; slight increase in muscle tone throughout; no resting tremor noted. Sensory: Normal throughout. Deep Tendon Reflexes: 1+ and symmetric in upper extremities and trace only in lower extremities. Plantars: Flexor bilaterally Cerebellar: Normal finger-to-nose testing. Carotid auscultation: Normal  Results for orders placed or performed during the hospital encounter of 03/05/15 (from the past 48 hour(s))  Protime-INR     Status: None   Collection Time: 03/05/15  8:41 PM  Result Value Ref Range   Prothrombin Time 13.5 11.6 - 15.2 seconds   INR 1.02 0.00 - 1.49  APTT     Status: None   Collection Time: 03/05/15  8:41 PM  Result Value Ref Range   aPTT 32 24 - 37 seconds  CBC     Status: None   Collection Time: 03/05/15  8:41 PM  Result Value Ref Range   WBC 6.7 4.0 - 10.5 K/uL   RBC 4.88 4.22 - 5.81 MIL/uL   Hemoglobin 15.5 13.0 - 17.0 g/dL   HCT 47.0 39.0 - 52.0 %   MCV 96.3 78.0 - 100.0 fL   MCH 31.8 26.0 - 34.0 pg   MCHC  33.0 30.0 - 36.0 g/dL   RDW 14.7 11.5 - 15.5 %   Platelets 159 150 - 400 K/uL  Differential     Status: None   Collection Time: 03/05/15  8:41 PM  Result Value Ref Range   Neutrophils Relative % 64 43 - 77 %   Neutro Abs 4.3 1.7 - 7.7 K/uL   Lymphocytes Relative 24 12 - 46 %   Lymphs Abs 1.6 0.7 - 4.0 K/uL   Monocytes Relative 10 3 - 12 %   Monocytes Absolute 0.7 0.1 - 1.0 K/uL   Eosinophils Relative 2 0 - 5 %   Eosinophils Absolute 0.1 0.0 - 0.7 K/uL   Basophils Relative 0 0 - 1 %   Basophils Absolute 0.0 0.0 - 0.1 K/uL  Comprehensive metabolic panel     Status: Abnormal   Collection Time: 03/05/15  8:41 PM  Result Value Ref Range   Sodium 139 135 - 145 mmol/L   Potassium 4.3 3.5 - 5.1 mmol/L   Chloride 103 96 - 112 mmol/L   CO2 28 19 - 32 mmol/L   Glucose, Bld 98 70 - 99 mg/dL    BUN 25 (H) 6 - 23 mg/dL   Creatinine, Ser 1.05 0.50 - 1.35 mg/dL   Calcium 9.3 8.4 - 10.5 mg/dL   Total Protein 6.9 6.0 - 8.3 g/dL   Albumin 4.2 3.5 - 5.2 g/dL   AST 26 0 - 37 U/L   ALT 9 0 - 53 U/L    Comment: RESULTS CONFIRMED BY MANUAL DILUTION   Alkaline Phosphatase 59 39 - 117 U/L   Total Bilirubin 0.7 0.3 - 1.2 mg/dL   GFR calc non Af Amer 65 (L) >90 mL/min   GFR calc Af Amer 75 (L) >90 mL/min    Comment: (NOTE) The eGFR has been calculated using the CKD EPI equation. This calculation has not been validated in all clinical situations. eGFR's persistently <90 mL/min signify possible Chronic Kidney Disease.    Anion gap 8 5 - 15  CBG monitoring, ED     Status: Abnormal   Collection Time: 03/05/15  8:42 PM  Result Value Ref Range   Glucose-Capillary 101 (H) 70 - 99 mg/dL   Comment 1 Notify RN   I-stat troponin, ED (not at [facility])     Status: None   Collection Time: 03/05/15  8:43 PM  Result Value Ref Range   Troponin i, poc 0.01 0.00 - 0.08 ng/mL   Comment 3            Comment: Due to the release kinetics of cTnI, a negative result within the first hours of the onset of symptoms does not rule out myocardial infarction with certainty. If myocardial infarction is still suspected, repeat the test at appropriate intervals.   I-Stat Chem 8, ED     Status: Abnormal   Collection Time: 03/05/15  8:46 PM  Result Value Ref Range   Sodium 138 135 - 145 mmol/L   Potassium 4.3 3.5 - 5.1 mmol/L   Chloride 104 96 - 112 mmol/L   BUN 31 (H) 6 - 23 mg/dL   Creatinine, Ser 1.10 0.50 - 1.35 mg/dL   Glucose, Bld 96 70 - 99 mg/dL   Calcium, Ion 1.05 (L) 1.13 - 1.30 mmol/L   TCO2 21 0 - 100 mmol/L   Hemoglobin 16.7 13.0 - 17.0 g/dL   HCT 49.0 39.0 - 52.0 %  Glucose, capillary     Status: Abnormal  Collection Time: 03/06/15 12:31 AM  Result Value Ref Range   Glucose-Capillary 108 (H) 70 - 99 mg/dL   Dg Chest 2 View  03/05/2015   CLINICAL DATA:  Facial droop.  CVA  EXAM: CHEST   2 VIEW  COMPARISON:  05/11/2007  FINDINGS: Chronic cardiomegaly. The patient is status post mitral valve replacement. Aortic tortuosity is prominent but stable from previous. Mild scarring again noted at the bases. There is no edema, consolidation, effusion, or pneumothorax.  IMPRESSION: Stable exam.  No active cardiopulmonary disease.   Electronically Signed   By: Monte Fantasia M.D.   On: 03/05/2015 23:58   Ct Head (brain) Wo Contrast  03/05/2015   CLINICAL DATA:  Right-sided facial droop and slurred speech.  EXAM: CT HEAD WITHOUT CONTRAST  TECHNIQUE: Contiguous axial images were obtained from the base of the skull through the vertex without intravenous contrast.  COMPARISON:  None.  FINDINGS: Age related cerebral atrophy, ventriculomegaly and periventricular white matter disease. There is a remote appearing lacunar type basal ganglia infarct on the right side. No CT findings for acute hemispheric infarction or intracranial hemorrhage. No mass lesions. No extra-axial fluid collections. The brainstem and cerebellum are grossly normal.  No significant bony findings. The paranasal sinuses and mastoid air cells are clear. The globes are intact.  IMPRESSION: Age related cerebral atrophy, ventriculomegaly and periventricular white matter disease.  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  No findings for acute hemispheric infarction or intracranial hemorrhage.   Electronically Signed   By: Marijo Sanes M.D.   On: 03/05/2015 21:02   Mr Brain Wo Contrast  03/06/2015   CLINICAL DATA:  Initial evaluation for acute speech difficulty, history of Parkinson's disease.  EXAM: MRI HEAD WITHOUT CONTRAST  MRA HEAD WITHOUT CONTRAST  TECHNIQUE: Multiplanar, multiecho pulse sequences of the brain and surrounding structures were obtained without intravenous contrast. Angiographic images of the head were obtained using MRA technique without contrast.  COMPARISON:  Prior CT from 03/05/2015.   FINDINGS: MRI HEAD FINDINGS  Diffuse prominence of the CSF containing spaces is compatible with generalized cerebral atrophy. No significant white matter changes present for patient age.  There are small remote infarcts involving the right frontal cortex (series 8, image 21). As well as the posterior left frontal cortex (series 8, image 20).  On DWI sequence, there is a single subtle 5 mm focus of increased signal intensity involving the posterior left frontal cortex in the region of the precentral gyrus (series 3, image 33 are on axial sequence, series 5, image 15 on coronal sequence). No definite hyperintense signal intensity seen on ADC maps is suggestive this T2 shine through. No definite signal abnormality seen within this region on corresponding T2 or FLAIR weighted sequence. Finding may reflect a small subtle cortical infarct versus artifact.  No other abnormal foci of restricted diffusion to suggest acute intracranial infarct identified. Gray-white matter differentiation otherwise maintained. No acute intracranial hemorrhage. Single small subcentimeter chronic micro hemorrhage noted within the left occipital lobe.  No mass lesion or midline shift. No hydrocephalus. No extra-axial fluid collection.  Craniocervical junction within normal limits. Pituitary gland normal. No acute abnormality seen about the orbits.  There is scattered opacity within the inferior right frontal sinus. Paranasal sinuses are otherwise clear. No mastoid effusion. Inner ear structures are normal.  Bone marrow signal intensity within normal limits. Scattered degenerative changes noted within the upper cervical spine.  MRA HEAD FINDINGS  ANTERIOR CIRCULATION:  Visualized portions of  the distal cervical segments of the internal carotid arteries are widely patent with antegrade flow. The petrous, cavernous, and supra clinoid segments are widely patent. A1 segments, anterior communicating artery, and anterior cerebral arteries well  opacified. M1 segments widely patent without stenosis or occlusion. MCA bifurcations normal. MCA branches symmetric and normal in appearance bilaterally.  POSTERIOR CIRCULATION:  Both vertebral arteries well opacified to the vertebrobasilar junction without stenosis or occlusion. Posterior inferior cerebral arteries not well evaluated on this exam. Basilar artery widely patent. Superior cerebellar and posterior cerebral arteries well opacified bilaterally without stenosis or occlusion. Anterior inferior cerebral arteries are patent proximally.  No aneurysm or vascular malformation.  IMPRESSION: MRI HEAD IMPRESSION:  1. Subtly increased 5 mm focus of high signal intensity within the posterior left frontal lobe on DWI sequence as above. Finding is favored to be artifactual in nature, although possible small acute ischemic infarct is not entirely excluded. Correlation with physical exam and history for possible acute ischemia in this territory is recommended. 2. No other acute intracranial infarct or other abnormality identified. 3. Small remote anterior right frontal and posterior left frontal cortical infarcts as above. 4. Generalized cerebral atrophy.  MRA HEAD IMPRESSION:  Unremarkable MRA of the intracranial circulation without proximal branch occlusion or hemodynamically significant stenosis.   Electronically Signed   By: Jeannine Boga M.D.   On: 03/06/2015 05:35   Mr Jodene Nam Head/brain Wo Cm  03/06/2015   CLINICAL DATA:  Initial evaluation for acute speech difficulty, history of Parkinson's disease.  EXAM: MRI HEAD WITHOUT CONTRAST  MRA HEAD WITHOUT CONTRAST  TECHNIQUE: Multiplanar, multiecho pulse sequences of the brain and surrounding structures were obtained without intravenous contrast. Angiographic images of the head were obtained using MRA technique without contrast.  COMPARISON:  Prior CT from 03/05/2015.  FINDINGS: MRI HEAD FINDINGS  Diffuse prominence of the CSF containing spaces is compatible  with generalized cerebral atrophy. No significant white matter changes present for patient age.  There are small remote infarcts involving the right frontal cortex (series 8, image 21). As well as the posterior left frontal cortex (series 8, image 20).  On DWI sequence, there is a single subtle 5 mm focus of increased signal intensity involving the posterior left frontal cortex in the region of the precentral gyrus (series 3, image 33 are on axial sequence, series 5, image 15 on coronal sequence). No definite hyperintense signal intensity seen on ADC maps is suggestive this T2 shine through. No definite signal abnormality seen within this region on corresponding T2 or FLAIR weighted sequence. Finding may reflect a small subtle cortical infarct versus artifact.  No other abnormal foci of restricted diffusion to suggest acute intracranial infarct identified. Gray-white matter differentiation otherwise maintained. No acute intracranial hemorrhage. Single small subcentimeter chronic micro hemorrhage noted within the left occipital lobe.  No mass lesion or midline shift. No hydrocephalus. No extra-axial fluid collection.  Craniocervical junction within normal limits. Pituitary gland normal. No acute abnormality seen about the orbits.  There is scattered opacity within the inferior right frontal sinus. Paranasal sinuses are otherwise clear. No mastoid effusion. Inner ear structures are normal.  Bone marrow signal intensity within normal limits. Scattered degenerative changes noted within the upper cervical spine.  MRA HEAD FINDINGS  ANTERIOR CIRCULATION:  Visualized portions of the distal cervical segments of the internal carotid arteries are widely patent with antegrade flow. The petrous, cavernous, and supra clinoid segments are widely patent. A1 segments, anterior communicating artery, and anterior cerebral arteries well opacified. M1  segments widely patent without stenosis or occlusion. MCA bifurcations normal. MCA  branches symmetric and normal in appearance bilaterally.  POSTERIOR CIRCULATION:  Both vertebral arteries well opacified to the vertebrobasilar junction without stenosis or occlusion. Posterior inferior cerebral arteries not well evaluated on this exam. Basilar artery widely patent. Superior cerebellar and posterior cerebral arteries well opacified bilaterally without stenosis or occlusion. Anterior inferior cerebral arteries are patent proximally.  No aneurysm or vascular malformation.  IMPRESSION: MRI HEAD IMPRESSION:  1. Subtly increased 5 mm focus of high signal intensity within the posterior left frontal lobe on DWI sequence as above. Finding is favored to be artifactual in nature, although possible small acute ischemic infarct is not entirely excluded. Correlation with physical exam and history for possible acute ischemia in this territory is recommended. 2. No other acute intracranial infarct or other abnormality identified. 3. Small remote anterior right frontal and posterior left frontal cortical infarcts as above. 4. Generalized cerebral atrophy.  MRA HEAD IMPRESSION:  Unremarkable MRA of the intracranial circulation without proximal branch occlusion or hemodynamically significant stenosis.   Electronically Signed   By: Jeannine Boga M.D.   On: 03/06/2015 05:35    Assessment: 81 y.o. male with multiple risk factors for stroke presenting with small left posterior frontal cortical infarction. Patient has Parkinson's disease as well which is likely contributing to the extent of dysarthria who presents.  Stroke Risk Factors - family history and hyperlipidemia  Plan: 1. HgbA1c, fasting lipid panel 2. PT consult, OT consult, Speech consult 3. Echocardiogram 4. Carotid dopplers 5. Prophylactic therapy-Antiplatelet med: Aspirin  6. Risk factor modification 7. Telemetry monitoring  C.R. Nicole Kindred, MD [facility] [phone]  03/06/2015, 6:28 AM

## 2015-03-06 NOTE — Progress Notes (Signed)
Patient Demographics  [name] [name], is a 81 y.o. male, DOB - [birth year], ZOX:[id number]  Admit date - 03/05/2015   Admitting Physician Eduard Clos, MD  Outpatient Primary MD for the patient is Ezequiel Kayser, MD  LOS -    Chief Complaint  Patient presents with  . Facial Droop        Subjective:   [name] today has, No headache, No chest pain, No abdominal pain - No Nausea, No new weakness tingling or numbness, No Cough - SOB. Speech almost back to baseline.  Assessment & Plan    1. Mild dysarthria and facial droop likely due to left frontal CVA versus TIA. Full stroke workup underway, neurology following, currently on aspirin along with home dose statin. Pending A1c, lipid panel, PT-OT-speech evaluation, echogram and carotid duplex.   2. Dyslipidemia continue home dose statin. Pending lipid panel.   3. Parkinson's. Continue home medications no acute issues.   4. Mitral valve repair. Mild systolic murmur. Current echo pending. No acute issues.     Code Status: Full  Family Communication: wife  Disposition Plan: Likely home   Procedures CT-Mri Head, TTE, Carotids   Consults  Neuro   Medications  Scheduled Meds: . amantadine  100 mg Oral BID  . aspirin  300 mg Rectal Daily   Or  . aspirin  325 mg Oral Daily  . atorvastatin  20 mg Oral Daily  . Carbidopa-Levodopa ER  3 capsule Oral BID  . enoxaparin (LOVENOX) injection  40 mg Subcutaneous Q24H  . rasagiline  1 mg Oral Daily  . rotigotine  1 patch Transdermal Daily   Continuous Infusions:  PRN Meds:.  DVT Prophylaxis  Lovenox   Lab Results  Component Value Date   PLT 159 03/05/2015    Antibiotics     Anti-infectives    None          Objective:   Filed Vitals:   03/06/15 0400 03/06/15 0608  03/06/15 0753 03/06/15 1002  BP: 113/63 120/68 115/76 104/76  Pulse: 68 68 63 77  Temp: 98 F (36.7 C) 97.9 F (36.6 C) 97.7 F (36.5 C) 97.7 F (36.5 C)  TempSrc: Oral Oral Oral Oral  Resp: Height:      Weight:      SpO2: 94% 96% 94% 95%    Wt Readings from Last 3 Encounters:  03/06/15 77.2 kg (170 lb 3.1 oz)  08/06/13 85.503 kg (188 lb 8 oz)     Intake/Output Summary (Last 24 hours) at 03/06/15 1136 Last data filed at 03/06/15 0816  Gross per 24 hour  Intake      0 ml  Output    900 ml  Net   -900 ml     Physical Exam  Awake Alert, Oriented X 3, No new F.N deficits, Normal affect, Mild Facial droop Colp.AT,PERRAL Supple Neck,No JVD, No cervical lymphadenopathy appriciated.  Symmetrical Chest wall movement, Good air movement bilaterally, CTAB RRR,No Gallops,Rubs , positive systolic murmur in the mitral valve area, No Parasternal Heave +ve B.Sounds, Abd Soft, No tenderness, No organomegaly appriciated, No rebound - guarding or rigidity. No Cyanosis, Clubbing or edema, No new Rash or bruise      Data Review  Micro Results No results found for this or any previous visit (from the past 240 hour(s)).  Radiology Reports Dg Chest 2 View  03/05/2015   CLINICAL DATA:  Facial droop.  CVA  EXAM: CHEST  2 VIEW  COMPARISON:  05/11/2007  FINDINGS: Chronic cardiomegaly. The patient is status post mitral valve replacement. Aortic tortuosity is prominent but stable from previous. Mild scarring again noted at the bases. There is no edema, consolidation, effusion, or pneumothorax.  IMPRESSION: Stable exam.  No active cardiopulmonary disease.   Electronically Signed   By: Marnee SpringJonathon  Watts M.D.   On: 03/05/2015 23:58   Ct Head (brain) Wo Contrast  03/05/2015   CLINICAL DATA:  Right-sided facial droop and slurred speech.  EXAM: CT HEAD WITHOUT CONTRAST  TECHNIQUE: Contiguous axial images were obtained from the base of the skull through the vertex without intravenous contrast.   COMPARISON:  None.  FINDINGS: Age related cerebral atrophy, ventriculomegaly and periventricular white matter disease. There is a remote appearing lacunar type basal ganglia infarct on the right side. No CT findings for acute hemispheric infarction or intracranial hemorrhage. No mass lesions. No extra-axial fluid collections. The brainstem and cerebellum are grossly normal.  No significant bony findings. The paranasal sinuses and mastoid air cells are clear. The globes are intact.  IMPRESSION: Age related cerebral atrophy, ventriculomegaly and periventricular white matter disease.  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  No findings for acute hemispheric infarction or intracranial hemorrhage.   Electronically Signed   By: Rudie MeyerP.  Gallerani M.D.   On: 03/05/2015 21:02   Mr Brain Wo Contrast  03/06/2015   CLINICAL DATA:  Initial evaluation for acute speech difficulty, history of Parkinson's disease.  EXAM: MRI HEAD WITHOUT CONTRAST  MRA HEAD WITHOUT CONTRAST  TECHNIQUE: Multiplanar, multiecho pulse sequences of the brain and surrounding structures were obtained without intravenous contrast. Angiographic images of the head were obtained using MRA technique without contrast.  COMPARISON:  Prior CT from 03/05/2015.  FINDINGS: MRI HEAD FINDINGS  Diffuse prominence of the CSF containing spaces is compatible with generalized cerebral atrophy. No significant white matter changes present for patient age.  There are small remote infarcts involving the right frontal cortex (series 8, image 21). As well as the posterior left frontal cortex (series 8, image 20).  On DWI sequence, there is a single subtle 5 mm focus of increased signal intensity involving the posterior left frontal cortex in the region of the precentral gyrus (series 3, image 33 are on axial sequence, series 5, image 15 on coronal sequence). No definite hyperintense signal intensity seen on ADC maps is suggestive this T2 shine  through. No definite signal abnormality seen within this region on corresponding T2 or FLAIR weighted sequence. Finding may reflect a small subtle cortical infarct versus artifact.  No other abnormal foci of restricted diffusion to suggest acute intracranial infarct identified. Gray-white matter differentiation otherwise maintained. No acute intracranial hemorrhage. Single small subcentimeter chronic micro hemorrhage noted within the left occipital lobe.  No mass lesion or midline shift. No hydrocephalus. No extra-axial fluid collection.  Craniocervical junction within normal limits. Pituitary gland normal. No acute abnormality seen about the orbits.  There is scattered opacity within the inferior right frontal sinus. Paranasal sinuses are otherwise clear. No mastoid effusion. Inner ear structures are normal.  Bone marrow signal intensity within normal limits. Scattered degenerative changes noted within the upper cervical spine.  MRA HEAD FINDINGS  ANTERIOR CIRCULATION:  Visualized portions of the distal  cervical segments of the internal carotid arteries are widely patent with antegrade flow. The petrous, cavernous, and supra clinoid segments are widely patent. A1 segments, anterior communicating artery, and anterior cerebral arteries well opacified. M1 segments widely patent without stenosis or occlusion. MCA bifurcations normal. MCA branches symmetric and normal in appearance bilaterally.  POSTERIOR CIRCULATION:  Both vertebral arteries well opacified to the vertebrobasilar junction without stenosis or occlusion. Posterior inferior cerebral arteries not well evaluated on this exam. Basilar artery widely patent. Superior cerebellar and posterior cerebral arteries well opacified bilaterally without stenosis or occlusion. Anterior inferior cerebral arteries are patent proximally.  No aneurysm or vascular malformation.  IMPRESSION: MRI HEAD IMPRESSION:  1. Subtly increased 5 mm focus of high signal intensity within  the posterior left frontal lobe on DWI sequence as above. Finding is favored to be artifactual in nature, although possible small acute ischemic infarct is not entirely excluded. Correlation with physical exam and history for possible acute ischemia in this territory is recommended. 2. No other acute intracranial infarct or other abnormality identified. 3. Small remote anterior right frontal and posterior left frontal cortical infarcts as above. 4. Generalized cerebral atrophy.  MRA HEAD IMPRESSION:  Unremarkable MRA of the intracranial circulation without proximal branch occlusion or hemodynamically significant stenosis.   Electronically Signed   By: Rise Mu M.D.   On: 03/06/2015 05:35   Mr Maxine Glenn Head/brain Wo Cm  03/06/2015   CLINICAL DATA:  Initial evaluation for acute speech difficulty, history of Parkinson's disease.  EXAM: MRI HEAD WITHOUT CONTRAST  MRA HEAD WITHOUT CONTRAST  TECHNIQUE: Multiplanar, multiecho pulse sequences of the brain and surrounding structures were obtained without intravenous contrast. Angiographic images of the head were obtained using MRA technique without contrast.  COMPARISON:  Prior CT from 03/05/2015.  FINDINGS: MRI HEAD FINDINGS  Diffuse prominence of the CSF containing spaces is compatible with generalized cerebral atrophy. No significant white matter changes present for patient age.  There are small remote infarcts involving the right frontal cortex (series 8, image 21). As well as the posterior left frontal cortex (series 8, image 20).  On DWI sequence, there is a single subtle 5 mm focus of increased signal intensity involving the posterior left frontal cortex in the region of the precentral gyrus (series 3, image 33 are on axial sequence, series 5, image 15 on coronal sequence). No definite hyperintense signal intensity seen on ADC maps is suggestive this T2 shine through. No definite signal abnormality seen within this region on corresponding T2 or FLAIR  weighted sequence. Finding may reflect a small subtle cortical infarct versus artifact.  No other abnormal foci of restricted diffusion to suggest acute intracranial infarct identified. Gray-white matter differentiation otherwise maintained. No acute intracranial hemorrhage. Single small subcentimeter chronic micro hemorrhage noted within the left occipital lobe.  No mass lesion or midline shift. No hydrocephalus. No extra-axial fluid collection.  Craniocervical junction within normal limits. Pituitary gland normal. No acute abnormality seen about the orbits.  There is scattered opacity within the inferior right frontal sinus. Paranasal sinuses are otherwise clear. No mastoid effusion. Inner ear structures are normal.  Bone marrow signal intensity within normal limits. Scattered degenerative changes noted within the upper cervical spine.  MRA HEAD FINDINGS  ANTERIOR CIRCULATION:  Visualized portions of the distal cervical segments of the internal carotid arteries are widely patent with antegrade flow. The petrous, cavernous, and supra clinoid segments are widely patent. A1 segments, anterior communicating artery, and anterior cerebral arteries well opacified. M1 segments widely  patent without stenosis or occlusion. MCA bifurcations normal. MCA branches symmetric and normal in appearance bilaterally.  POSTERIOR CIRCULATION:  Both vertebral arteries well opacified to the vertebrobasilar junction without stenosis or occlusion. Posterior inferior cerebral arteries not well evaluated on this exam. Basilar artery widely patent. Superior cerebellar and posterior cerebral arteries well opacified bilaterally without stenosis or occlusion. Anterior inferior cerebral arteries are patent proximally.  No aneurysm or vascular malformation.  IMPRESSION: MRI HEAD IMPRESSION:  1. Subtly increased 5 mm focus of high signal intensity within the posterior left frontal lobe on DWI sequence as above. Finding is favored to be artifactual  in nature, although possible small acute ischemic infarct is not entirely excluded. Correlation with physical exam and history for possible acute ischemia in this territory is recommended. 2. No other acute intracranial infarct or other abnormality identified. 3. Small remote anterior right frontal and posterior left frontal cortical infarcts as above. 4. Generalized cerebral atrophy.  MRA HEAD IMPRESSION:  Unremarkable MRA of the intracranial circulation without proximal branch occlusion or hemodynamically significant stenosis.   Electronically Signed   By: Rise Mu M.D.   On: 03/06/2015 05:35     CBC  Recent Labs Lab 03/05/15 2041 03/05/15 2046  WBC 6.7  --   HGB 15.5 16.7  HCT 47.0 49.0  PLT 159  --   MCV 96.3  --   MCH 31.8  --   MCHC 33.0  --   RDW 14.7  --   LYMPHSABS 1.6  --   MONOABS 0.7  --   EOSABS 0.1  --   BASOSABS 0.0  --     Chemistries   Recent Labs Lab 03/05/15 2041 03/05/15 2046  NA 139 138  K 4.3 4.3  CL 103 104  CO2 28  --   GLUCOSE 98 96  BUN 25* 31*  CREATININE 1.05 1.10  CALCIUM 9.3  --   AST 26  --   ALT 9  --   ALKPHOS 59  --   BILITOT 0.7  --    ------------------------------------------------------------------------------------------------------------------ estimated creatinine clearance is 58.5 mL/min (by C-G formula based on Cr of 1.1). ------------------------------------------------------------------------------------------------------------------ No results for input(s): HGBA1C in the last 72 hours. ------------------------------------------------------------------------------------------------------------------ No results for input(s): CHOL, HDL, LDLCALC, TRIG, CHOLHDL, LDLDIRECT in the last 72 hours. ------------------------------------------------------------------------------------------------------------------ No results for input(s): TSH, T4TOTAL, T3FREE, THYROIDAB in the last 72 hours.  Invalid input(s):  FREET3 ------------------------------------------------------------------------------------------------------------------ No results for input(s): VITAMINB12, FOLATE, FERRITIN, TIBC, IRON, RETICCTPCT in the last 72 hours.  Coagulation profile  Recent Labs Lab 03/05/15 2041  INR 1.02    No results for input(s): DDIMER in the last 72 hours.  Cardiac Enzymes No results for input(s): CKMB, TROPONINI, MYOGLOBIN in the last 168 hours.  Invalid input(s): CK ------------------------------------------------------------------------------------------------------------------ Invalid input(s): POCBNP     Time Spent in minutes   35   Susa Raring K M.D on 03/06/2015 at 11:36 AM  Between 7am to 7pm - Pager - [phone]  After 7pm go to [email or web address] - password [email or web address]  [facility]   Office  [phone]

## 2015-03-06 NOTE — Evaluation (Signed)
Occupational Therapy Evaluation and Discharge Summary Patient Details Name: [name] [name] MRN: [id number] DOB: 1932-07-07 Today's Date: 03/06/2015    History of Present Illness [name] [name] is an 81 y.o. male with a history of Parkinson's disease, mitral valve replacement and dyslipidemia, who developed acute onset of right facial droop and speech output difficulty at 7:30 PM on 03/05/2015. There's no previous documentation of stroke nor TIA. 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   Clinical Impression   Pt admitted with the above symptoms and overall has functionally returned to baseline. Pt and wife were educated at length about signs and symptoms of a stroke and what to do if those signs appear.  Pt and wife with several questions that were answered.  No further OT at this time as pt is at or close to baseline adl status.    Follow Up Recommendations  No OT follow up;Supervision - Intermittent    Equipment Recommendations  None recommended by OT    Recommendations for Other Services       Precautions / Restrictions Precautions Precautions: Fall Restrictions Weight Bearing Restrictions: No      Mobility Bed Mobility Overal bed mobility: Modified Independent             General bed mobility comments: requires extra time  Transfers Overall transfer level: Needs assistance Equipment used: None Transfers: Sit to/from Stand Sit to Stand: Supervision         General transfer comment: No hands on assist needed. Supervision provided at this was first time seeing pt.  Pt was mod I by end of session.    Balance Overall balance assessment: Needs assistance Sitting-balance support: No upper extremity supported;Feet supported Sitting balance-Leahy Scale: Normal     Standing balance support: Bilateral upper extremity supported;During functional activity Standing balance-Leahy Scale: Good Standing balance comment: Pt stood at  sink to groom w/o assist.                            ADL Overall ADL's : At baseline                                       General ADL Comments: Pt and wife feel pt is at baseline. pt able to walk in room and complete adls w/o assist. Pt continue to have a very mild R facial droop but is now able to feed self and swallow without obvoius difficulty.     Vision Vision Assessment?: Yes Eye Alignment: Within Functional Limits Ocular Range of Motion: Within Functional Limits Alignment/Gaze Preference: Within Defined Limits Tracking/Visual Pursuits: Able to track stimulus in all quads without difficulty Saccades: Within functional limits Convergence: Within functional limits Visual Fields: No apparent deficits   Perception     Praxis      Pertinent Vitals/Pain Pain Assessment: No/denies pain     Hand Dominance Right   Extremity/Trunk Assessment Upper Extremity Assessment Upper Extremity Assessment: Overall WFL for tasks assessed   Lower Extremity Assessment Lower Extremity Assessment: Defer to PT evaluation   Cervical / Trunk Assessment Cervical / Trunk Assessment: Normal   Communication Communication Communication: No difficulties   Cognition Arousal/Alertness: Awake/alert Behavior During Therapy: WFL for tasks assessed/performed Overall Cognitive Status: Within Functional Limits for tasks assessed  General Comments       Exercises       Shoulder Instructions      Home Living Family/patient expects to be discharged to:: Private residence Living Arrangements: Spouse/significant other Available Help at Discharge: Family;Available 24 hours/day Type of Home: House Home Access: Stairs to enter Entergy CorporationEntrance Stairs-Number of Steps: 1 Entrance Stairs-Rails: None Home Layout: Two level;Able to live on main level with bedroom/bathroom     Bathroom Shower/Tub: Walk-in shower;Door;Tub/shower unit;Curtain Shower/tub  characteristics: Engineer, building servicesCurtain Bathroom Toilet: Standard     Home Equipment: Cane - single point;Other (comment);Walker - 4 wheels;Tub bench      Lives With: Spouse    Prior Functioning/Environment Level of Independence: Independent             OT Diagnosis:     OT Problem List:     OT Treatment/Interventions:      OT Goals(Current goals can be found in the care plan section) Acute Rehab OT Goals Patient Stated Goal: Go home as soon as possible  OT Frequency:     Barriers to D/C:            Co-evaluation              End of Session Nurse Communication: Mobility status  Activity Tolerance: Patient tolerated treatment well Patient left: in chair;with call bell/phone within reach;with nursing/sitter in room   Time: 1205-1230 OT Time Calculation (min): 25 min Charges:  OT General Charges $OT Visit: 1 Procedure OT Evaluation $Initial OT Evaluation Tier I: 1 Procedure OT Treatments $Self Care/Home Management : 8-22 mins G-Codes: OT G-codes **NOT FOR INPATIENT CLASS** Functional Assessment Tool Used: clinical judgement Functional Limitation: Self care Self Care Current Status ([id number]): At least 1 percent but less than 20 percent impaired, limited or restricted Self Care Goal Status ([id number]): At least 1 percent but less than 20 percent impaired, limited or restricted Self Care Discharge Status [phone]([id number]): At least 1 percent but less than 20 percent impaired, limited or restricted  Hope BuddsJones, Ameerah Huffstetler Anne 03/06/2015, 12:43 PM  [phone]

## 2015-03-06 NOTE — Evaluation (Signed)
Clinical/Bedside Swallow Evaluation Patient Details  Name: [name] HensenJohn [name] MRN: [id number] Date of Birth: 01/17/1932  Today's Date: 03/06/2015 Time: SLP Start Time (ACUTE ONLY): 1024 SLP Stop Time (ACUTE ONLY): 1040 SLP Time Calculation (min) (ACUTE ONLY): 16 min  Past Medical History:  Past Medical History  Diagnosis Date  . Parkinson's disease   . S/P mitral valve replacement     mitral regurg, endocarditis  . Dyslipidemia    Past Surgical History:  Past Surgical History  Procedure Laterality Date  . Mitral valve annuloplasty  04/10/2007     26mm Edwards ring; r/t h/o MR and flail mitral leaflets due to endocarditis: MRI safe  . Cataract extraction  2009, 2012    right 2009, left 2012  . Transthoracic echocardiogram  08/15/2012    EF=>55%; normal LV systolic function; RV mildly dilated & systolic function mildly reduced; RA mod dilated; mild -mod MR & mildy increased gradients; mild TR; AV mildly sclerotic with mild-mod regurg   HPI:  81 y.o. male with Past medical history of Parkinson's disease, mitral valve repair, dyslipidemia admitted with speech difficulty and drooling from the left side of his mouth. MRI subtle 5 mm focus of increased   Assessment / Plan / Recommendation Clinical Impression  Pt has baseline dysphagia with MBS "2-3 years ago" per pt/family without need for thick liquids, recommended straw use. He exhibited immediate cough x 1 following cup sip this out of many trials. Swallow is audible and slightly sluggish. Subsequent small and controlled sips consumed without difficulty. He required min verbal education to decrease rate no vocalizing immediatley after swallow with clinical reasoning (shorter apenic period). Functional oral prep, mastication and transit with solid texture. Recommend regular texture and thin, no straws, pills with liquid (observed with RN; no difficulty), slow pace, small sips, limit distractions. No further ST needed.    Aspiration Risk  Moderate    Diet Recommendation Regular;Thin liquid   Liquid Administration via: Cup;No straw Medication Administration: Whole meds with liquid Supervision: Patient able to self feed;Intermittent supervision to cue for compensatory strategies Compensations: Slow rate;Small sips/bites Postural Changes and/or Swallow Maneuvers: Seated upright 90 degrees    Other  Recommendations Oral Care Recommendations: Oral care BID   Follow Up Recommendations  None    Frequency and Duration        Pertinent Vitals/Pain  none        Swallow Study Prior Functional Status  Type of Home: House Available Help at Discharge: Family;Available 24 hours/day    General HPI: 81 y.o. male with Past medical history of Parkinson's disease, mitral valve repair, dyslipidemia admitted with speech difficulty and drooling from the left side of his mouth. MRI subtle 5 mm focus of increased Type of Study: Bedside swallow evaluation Previous Swallow Assessment:  ([facility] Diet Prior to this Study: Regular;Thin liquids (regular/thin at home) Temperature Spikes Noted: No Respiratory Status: Room air History of Recent Intubation: No Behavior/Cognition: Alert;Cooperative;Pleasant mood Oral Cavity - Dentition: Adequate natural dentition Self-Feeding Abilities: Able to feed self Patient Positioning: Upright in chair Baseline Vocal Quality: Low vocal intensity;Clear Volitional Cough:  (moderately strong) Volitional Swallow: Able to elicit    Oral/Motor/Sensory Function Overall Oral Motor/Sensory Function: Impaired Labial ROM: Reduced right Labial Symmetry: Abnormal symmetry right Labial Strength: Within Functional Limits Labial Sensation: Within Functional Limits Lingual ROM: Within Functional Limits Lingual Symmetry: Within Functional Limits Lingual Strength: Within Functional Limits Lingual Sensation: Within Functional Limits Facial ROM: Within Functional Limits Facial Symmetry: Within Functional  Limits Facial Strength: Within Functional Limits Facial Sensation: Within Functional Limits Velum: Within Functional Limits Mandible: Within Functional Limits   Ice Chips Ice chips: Not tested   Thin Liquid Thin Liquid: Impaired Presentation: Cup Oral Phase Impairments:  (none) Pharyngeal  Phase Impairments: Suspected delayed Swallow (audible/sluggish swallow)    Nectar Thick Nectar Thick Liquid: Not tested   Honey Thick Honey Thick Liquid: Not tested   Puree Puree: Impaired Presentation: Spoon Pharyngeal Phase Impairments:  (audible swallow)   Solid   GO Functional Assessment Tool Used:  (skilled clinical judgement) Functional Limitations: Swallowing Swallow Current Status (Z3086): At least 20 percent but less than 40 percent impaired, limited or restricted Swallow Goal Status [phone]): At least 20 percent but less than 40 percent impaired, limited or restricted Swallow Discharge Status [phone]): At least 20 percent but less than 40 percent impaired, limited or restricted  Solid: Within functional limits       [name] [name] 03/06/2015,11:25 AM  [name] [name] [name] [name].Ed [employer] [phone]

## 2015-03-06 NOTE — Progress Notes (Signed)
Pt failed RN swallow screen in ED and made NPO. Pt and wife aware of NPO order. Speech therapist evaluated pt and recommended a heart healthy diet with medications whole. Pharmacy called RN and asked if pt's wife had brought his home medications. Wife stated she had but she refused to have them sent down to pharmacy. Wife stated that she felt uncomfortable not being in control of his Parkinson's medications because pt becomes "foggy" if they are late. Pt's wife stated that she had already given the pt his Parkinson's medications this morning when she first arrived. RN reminded her that pt was NPO this morning but pt's wife stated that she thought it was more important for him to take his Parkinson's medications. Pharmacy called again and RN put pt's wife on the phone so she could clarify his medications with his pharmacist. Pt's wife stated that she would handle the pt's Parkinson's medications today. Pharmacy aware. Will continue to monitor.

## 2015-03-06 NOTE — Progress Notes (Signed)
    [facility] has been requested to perform a transesophageal echocardiogram on [name] [name] for CVA.  After careful review of history and examination, the risks and benefits of transesophageal echocardiogram have been explained including risks of esophageal damage, perforation (1:10,000 risk), bleeding, pharyngeal hematoma as well as other potential complications associated with conscious sedation including aspiration, arrhythmia, respiratory failure and death. Alternatives to treatment were discussed, questions were answered. Patient is willing to proceed.   Abelino DerrickKILROY,Amire Gossen K, PA 03/06/2015 5:15 PM

## 2015-03-06 NOTE — Progress Notes (Signed)
  Echocardiogram 2D Echocardiogram has been performed.  Koryn Charlot FRANCES 03/06/2015, 2:56 PM

## 2015-03-06 NOTE — Progress Notes (Signed)
UR completed 

## 2015-03-06 NOTE — Progress Notes (Signed)
Received via [facility], patient oriented to room, tele applied.   VSS, slightly slurred speech, but states resolving.  Only valuables at bedside = clothing.

## 2015-03-06 NOTE — Evaluation (Signed)
Speech Language Pathology Evaluation Patient Details Name: [name] [name] MRN: [id number] DOB: Nov 27, 1931 Today's Date: 03/06/2015 Time: 4098-1191 SLP Time Calculation (min) (ACUTE ONLY): 7 min  Problem List:  Patient Active Problem List   Diagnosis Date Noted  . TIA (transient ischemic attack) 03/05/2015  . Slurred speech 03/05/2015  . Parkinson's disease 08/06/2013  . Dyslipidemia 08/06/2013  . S/P mitral valve repair 08/06/2013   Past Medical History:  Past Medical History  Diagnosis Date  . Parkinson's disease   . S/P mitral valve replacement     mitral regurg, endocarditis  . Dyslipidemia    Past Surgical History:  Past Surgical History  Procedure Laterality Date  . Mitral valve annuloplasty  04/10/2007     26mm Edwards ring; r/t h/o MR and flail mitral leaflets due to endocarditis: MRI safe  . Cataract extraction  2009, 2012    right 2009, left 2012  . Transthoracic echocardiogram  08/15/2012    EF=>55%; normal LV systolic function; RV mildly dilated & systolic function mildly reduced; RA mod dilated; mild -mod MR & mildy increased gradients; mild TR; AV mildly sclerotic with mild-mod regurg   HPI:  81 y.o. male with Past medical history of Parkinson's disease, mitral valve repair, dyslipidemia admitted with speech difficulty and drooling from the left side of his mouth. MRI subtle 5 mm focus of increased   Assessment / Plan / Recommendation Clinical Impression  Pt presents with mild baseline dysarthria characterized by decreased volume and respiratory support due to Parkinson's. Pt and wife state volume currently is slightly decreased from baseline. Conversational speech is 100% intelligible. No cognitive impairments detected. No ST warranted at present.    SLP Assessment  Patient does not need any further Speech Lanaguage Pathology Services    Follow Up Recommendations  None    Frequency and Duration        Pertinent Vitals/Pain Pain Assessment: No/denies pain    SLP Goals     SLP Evaluation Prior Functioning  Cognitive/Linguistic Baseline: Within functional limits Type of Home: House  Lives With: Spouse Available Help at Discharge: Family;Available 24 hours/day   Cognition  Overall Cognitive Status: Within Functional Limits for tasks assessed Orientation Level: Oriented X4    Comprehension  Auditory Comprehension Overall Auditory Comprehension: Appears within functional limits for tasks assessed Visual Recognition/Discrimination Discrimination: Not tested    Expression Expression Primary Mode of Expression: Verbal Verbal Expression Overall Verbal Expression: Appears within functional limits for tasks assessed Written Expression Dominant Hand:  (both) Written Expression: Not tested   Oral / Motor Oral Motor/Sensory Function Overall Oral Motor/Sensory Function: Impaired Labial ROM: Reduced right Labial Symmetry: Abnormal symmetry right Labial Strength: Within Functional Limits Labial Sensation: Within Functional Limits Lingual ROM: Within Functional Limits Lingual Symmetry: Within Functional Limits Lingual Strength: Within Functional Limits Lingual Sensation: Within Functional Limits Facial ROM: Within Functional Limits Facial Symmetry: Within Functional Limits Facial Strength: Within Functional Limits Facial Sensation: Within Functional Limits Velum: Within Functional Limits Mandible: Within Functional Limits Motor Speech Overall Motor Speech: Impaired Respiration: Within functional limits Phonation: Low vocal intensity Resonance: Within functional limits Articulation: Within functional limitis Intelligibility: Intelligible Motor Planning: Witnin functional limits   GO Functional Assessment Tool Used: skilled clinical judgement Functional Limitations: Spoken language expressive Swallow Current Status (Y7829): At least 1 percent but less than 20 percent impaired, limited or restricted Swallow Goal Status [phone]): At  least 1 percent but less than 20 percent impaired, limited or restricted Swallow Discharge Status [phone]): At least 1 percent but less  than 20 percent impaired, limited or restricted Spoken Language Expression Current Status [phone](G9162): At least 1 percent but less than 20 percent impaired, limited or restricted Spoken Language Expression Goal Status [phone](G9163): At least 1 percent but less than 20 percent impaired, limited or restricted Spoken Language Expression Discharge Status [phone](G9164): At least 1 percent but less than 20 percent impaired, limited or restricted   Royce MacadamiaLitaker, Phyllis Abelson Willis 03/06/2015, 11:34 AM   Breck CoonsLisa Willis Lonell FaceLitaker M.Ed [employer] Pager [phone]

## 2015-03-07 ENCOUNTER — Encounter (HOSPITAL_COMMUNITY): Payer: Self-pay | Admitting: Cardiology

## 2015-03-07 ENCOUNTER — Encounter (HOSPITAL_COMMUNITY)
Admission: EM | Discharge status: Against Medical Advice | Payer: Self-pay | Source: Home / Self Care | Attending: Emergency Medicine

## 2015-03-07 DIAGNOSIS — E785 Hyperlipidemia, unspecified: Secondary | ICD-10-CM | POA: Diagnosis not present

## 2015-03-07 DIAGNOSIS — I340 Nonrheumatic mitral (valve) insufficiency: Secondary | ICD-10-CM | POA: Diagnosis not present

## 2015-03-07 DIAGNOSIS — G458 Other transient cerebral ischemic attacks and related syndromes: Secondary | ICD-10-CM

## 2015-03-07 DIAGNOSIS — G459 Transient cerebral ischemic attack, unspecified: Secondary | ICD-10-CM | POA: Diagnosis not present

## 2015-03-07 DIAGNOSIS — I63412 Cerebral infarction due to embolism of left middle cerebral artery: Secondary | ICD-10-CM | POA: Diagnosis not present

## 2015-03-07 DIAGNOSIS — Z9889 Other specified postprocedural states: Secondary | ICD-10-CM | POA: Diagnosis not present

## 2015-03-07 DIAGNOSIS — G20 Parkinson's disease: Secondary | ICD-10-CM | POA: Diagnosis not present

## 2015-03-07 LAB — GLUCOSE, CAPILLARY: Glucose-Capillary: 95 mg/dL (ref 70–99)

## 2015-03-07 LAB — LIPID PANEL
CHOL/HDL RATIO: 3.3 ratio
CHOLESTEROL: 160 mg/dL (ref 0–200)
HDL: 48 mg/dL (ref >39)
LDL Cholesterol: 93 mg/dL (ref 0–99)
Triglycerides: 94 mg/dL (ref <150)
VLDL: 19 mg/dL (ref 0–40)

## 2015-03-07 SURGERY — ECHOCARDIOGRAM, TRANSESOPHAGEAL
Anesthesia: Moderate Sedation

## 2015-03-07 MED ORDER — ASPIRIN 325 MG PO TABS: 325.0000 mg | ORAL_TABLET | Freq: Every day | ORAL | Status: DC

## 2015-03-07 MED ORDER — ATORVASTATIN CALCIUM 40 MG PO TABS: 40.0000 mg | ORAL_TABLET | Freq: Every day | ORAL | Status: DC

## 2015-03-07 NOTE — Progress Notes (Signed)
STROKE TEAM PROGRESS NOTE   HISTORY [name] [name] is an 82 y.o. male with a history of Parkinson's disease, mitral valve replacement and dyslipidemia, who developed acute onset of right facial droop and speech output difficulty at 7:30 PM on 03/05/2015 ([facility]). There's no previous documentation of stroke nor TIA. Patient has been on aspirin for antiplatelet therapy. 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 PH improved to baseline in the emergency room. Mild right facial droop persisted. NIH stroke score at time of this evaluation was 2. MRI showed an area of abnormal signal on diffusion-weighted imaging negative of probable left posterior frontal small cortical acute infarction. Patient was not administered TPA secondary to rapidly resolving deficits. He was admitted for further evaluation and treatment.   SUBJECTIVE (INTERVAL HISTORY) His wife is at the bedside.  The plan is for discharge today. He has a neurologist at [facility] who follows him for his Parkinson's disease. He will follow-up with Dr. Rishit Burkhalter for his stroke. He refused TEE today.    OBJECTIVE Temp:  [97.5 F (36.4 C)-98.2 F (36.8 C)] 98.1 F (36.7 C) (04/15 0900) Pulse Rate:  [66-72] 72 (04/15 0900) Cardiac Rhythm:  [-] Normal sinus rhythm (04/14 2000) Resp:  [16-18] 18 (04/15 0900) BP: (92-112)/(57-73) 100/66 mmHg (04/15 0900) SpO2:  [97 %-99 %] 99 % (04/15 0900)   Recent Labs Lab 03/05/15 2042 03/06/15 0031 03/06/15 1143 03/07/15 0523  GLUCAP 101* 108* 119* 95    Recent Labs Lab 03/05/15 2041 03/05/15 2046  NA 139 138  K 4.3 4.3  CL 103 104  CO2 28  --   GLUCOSE 98 96  BUN 25* 31*  CREATININE 1.05 1.10  CALCIUM 9.3  --     Recent Labs Lab 03/05/15 2041  AST 26  ALT 9  ALKPHOS 59  BILITOT 0.7  PROT 6.9  ALBUMIN 4.2    Recent Labs Lab 03/05/15 2041 03/05/15 2046  WBC 6.7  --   NEUTROABS 4.3  --   HGB 15.5 16.7  HCT 47.0 49.0  MCV 96.3  --   PLT  159  --    No results for input(s): CKTOTAL, CKMB, CKMBINDEX, TROPONINI in the last 168 hours.  Recent Labs  03/05/15 2041  LABPROT 13.5  INR 1.02   No results for input(s): COLORURINE, LABSPEC, PHURINE, GLUCOSEU, HGBUR, BILIRUBINUR, KETONESUR, PROTEINUR, UROBILINOGEN, NITRITE, LEUKOCYTESUR in the last 72 hours.  Invalid input(s): APPERANCEUR     Component Value Date/Time   CHOL 160 03/07/2015 0623   TRIG 94 03/07/2015 0623   HDL 48 03/07/2015 0623   CHOLHDL 3.3 03/07/2015 0623   VLDL 19 03/07/2015 0623   LDLCALC 93 03/07/2015 0623   No results found for: HGBA1C No results found for: LABOPIA, COCAINSCRNUR, LABBENZ, AMPHETMU, THCU, LABBARB  No results for input(s): ETH in the last 168 hours.  I have personally reviewed the radiological images below and agree with the radiology interpretations.  Dg Chest 2 View 03/05/2015    Stable exam.  No active cardiopulmonary disease.   Ct Head (brain) Wo Contrast 03/05/2015    Age related cerebral atrophy, ventriculomegaly and periventricular white matter disease.  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  No findings for acute hemispheric infarction or intracranial hemorrhage.    MRI HEAD 03/06/2015   1. Subtly increased 5 mm focus of high signal intensity within the posterior left frontal lobe on DWI sequence as above. Finding is   favored to be artifactual in nature, although possible small acute ischemic infarct is not entirely excluded. Correlation with physical exam and history for possible acute ischemia in this territory is recommended. 2. No other acute intracranial infarct or other abnormality identified. 3. Small remote anterior right frontal and posterior left frontal cortical infarcts as above. 4. Generalized cerebral atrophy.    MRA HEAD 03/06/2015    Unremarkable MRA of the intracranial circulation without proximal branch occlusion or hemodynamically significant stenosis.    CUS - Preliminary  findings: Bilateral: 1-39% ICA stenosis. Vertebral artery flow is antegrade.   2D echo  03/06/2015 Study Conclusions - Left ventricle: The cavity size was normal. Wall thickness was normal. Systolic function was normal. The estimated ejection fraction was in the range of 55% to 60%. - Aortic valve: There was mild regurgitation. - Mitral valve: s/p MV annuloplasty. Echodenisity on atrial side of anterior mitrla leaflet probably reflects some prolapse, cannot completely exclude small mass There are several jets of MR, one posterior, one central, one anteiror. Pkea nad mean gradients through the valver are 9 and 3 mm respectively This is increased from gradients seen in 2014. There was moderate regurgitation. Valve area by continuity equation (using LVOT flow): 1.27 cm^2. - Left atrium: The atrium was severely dilated. - Right ventricle: The cavity size was moderately dilated. Wall thickness was normal. - Right atrium: The atrium was mildly dilated. - Pericardium, extracardiac: A trivial pericardial effusion was identified.   TEE - pt refused.  LE venous doppler - pt refused.  PHYSICAL EXAM  Temp:  [97.5 F (36.4 C)-98.2 F (36.8 C)] 98.1 F (36.7 C) (04/15 0900) Pulse Rate:  [66-72] 72 (04/15 0900) Resp:  [16-18] 18 (04/15 0900) BP: (92-112)/(57-73) 100/66 mmHg (04/15 0900) SpO2:  [97 %-99 %] 99 % (04/15 0900)  General - Well nourished, well developed, in no apparent distress.  Ophthalmologic - Sharp disc margins OU.  Cardiovascular - Regular rate and rhythm with no murmur.  Mental Status -  Level of arousal and orientation to time, place, and person were intact. Language including expression, naming, repetition, comprehension was assessed and found intact. Fund of Knowledge was assessed and was intact.  Cranial Nerves II - XII - II - Visual field intact OU. III, IV, VI - Extraocular movements intact. V - Facial sensation intact  bilaterally. VII - Facial movement intact bilaterally, however, mild right nasolabial fold flattening. VIII - Hearing & vestibular intact bilaterally. X - Palate elevates symmetrically. XI - Chin turning & shoulder shrug intact bilaterally. XII - Tongue protrusion intact.  Motor Strength - The patient's strength was normal in all extremities and pronator drift was absent.  Bulk was normal and fasciculations were absent.   Motor Tone - Muscle tone was assessed at the neck and appendages and was normal.  Reflexes - The patient's reflexes were 1+ in all extremities and he had no pathological reflexes.  Sensory - Light touch, temperature/pinprick were assessed and were symmetrical.    Coordination - The patient had normal movements in the hands and feet with no ataxia or dysmetria.  Tremor was absent.  Gait and Station - deferred as pt is eating lunch.  ASSESSMENT/PLAN [name] is a 82 y.o. male with history of Parkinson's disease, mitral valve replacement and dyslipidemia, who developed acute onset of right facial droop and speech output difficulty. He did not receive IV t-PA due to rapidly resolving deficits.   Stroke:  left frontal infarct, cortical location, felt to be embolic   secondary to unknown source.  Resultant  Mild right facial weakness  MRI  L frontal cortical small and subtle infarct  MRA  Unremarkable   Carotid Doppler  unremarkable   2D Echo unremarkable as noted above  TEE - pt refused  LE venous doppler pt refused If carotid doppler without significant plaque, will check TEE to look for embolic source. Arranged with [facility] for tomorrow.  If positive for PFO (patent foramen ovale), check bilateral lower extremity venous dopplers to rule out DVT as possible source of stroke. (I have made patient NPO after midnight tonight).  If TEE negative, [facility] will consult and consider placement  of an implantable loop recorder to evaluate for atrial fibrillation as etiology of stroke. This has been explained to patient/family by Dr. Angelis Gates and they are agreeable.   LDL 93  HgbA1c 6.0  Lovenox 40 mg sq daily for VTE prophylaxis Diet - low sodium heart healthy  aspirin 81 mg orally every day prior to admission, now on aspirin 325 mg orally every day  Ongoing aggressive stroke risk factor management  Therapy recommendations:  No follow-up therapy  Disposition:  Discharged today  Hyperlipidemia  Home meds:  lipitor 20, resumed in hospital  LDL 93, goal < 70  Continue statin at discharge  Other Stroke Risk Factors  Advanced age  Family hx stroke (mother, hemorrhage)  Other Active Problems  Parkinson's disease - following up with [facility] - on sinement, azilect and neupro  Follow-up Dr. Alanys Godino for stroke in 2 months.  Hospital day #   RINEHULS, DAVID L  [facility] See Amion for Pager information 03/07/2015 3:58 PM   I, the attending vascular neurologist, have personally obtained a history, examined the patient, evaluated laboratory data, individually viewed imaging studies and agree with radiology interpretations. I also obtained additional history from pt's wife at bedside. Together with the NP/PA, we formulated the assessment and plan of care which reflects our mutual decision.  I have made any additions or clarifications directly to the above note and agree with the findings and plan as currently documented.   82 yo M with PD, MVR in 2008, HLD presented with right facial droop and expressive aphasia, rapid improving. Stroke work up so far negative except MRI showed left frontal cortical small and subtle infarct. Concerning for initial large clot breaking off with symptoms improving. With hx of MVR, we recommend TEE and loop recorder and DVT screening, However, pt refused and signed AMA for discharge. He will sontinue follow up with [facility] for PD.   Neurology  will sign off. Please call with questions. Pt will follow up with Dr. Eldon Zietlow at [facility] in about 2 months. Thanks for the consult.   Trashaun Streight, MD PhD Stroke Neurology 03/08/2015 11:08 PM      To contact Stroke Continuity provider, please refer to [email or web address]. After hours, contact General Neurology 

## 2015-03-07 NOTE — Consult Note (Signed)
cardiology consult note  Patient ID: Reinaldo Helt MRN: [id number], DOB/AGE: July 05, 1932   Admit date: 03/05/2015 Date of Consult: 03/07/2015  Primary Physician: Jerlyn Ly, MD Primary Cardiologist: Dr. Debara Pickett Reason for Consultation: Cryptogenic stroke;abnormal Echo  History of Present Illness Phinneas Shakoor was admitted on 03/05/2015 with slurred speech, drooling on right.  Some rt arm weakness.  Imaging demonstrated CT of head was negative but did reveal an old right basal ganglia infarction.  MRI: 1. Subtly increased 5 mm focus of high signal intensity within the posterior left frontal lobe on DWI sequence as above. Finding is favored to be artifactual in nature, although possible small acute ischemic infarct is not entirely excluded. Correlation with physical exam and history for possible acute ischemia in this territory is recommended. 2. No other acute intracranial infarct or other abnormality identified. 3. Small remote anterior right frontal and posterior left frontal cortical infarcts as above. 4. Generalized cerebral atrophy.  DX: left frontal infarct, cortical location, felt to be embolic secondary to unknown source.   He has undergone workup for stroke including echocardiogram and carotid dopplers.  The patient has been monitored on telemetry which has demonstrated sinus rhythm with no arrhythmias.  Inpatient stroke work-up is to be completed with a TEE.  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 He had mitral valve repair and a 26-mm Edwards annuloplasty ring. Unfortunately, he has Parkinson disease and has been on medicines recently for that. He had an echo in 2010 which showed trace mitral regurgitation and there was no evidence of any worsening heart murmur.  September 2014 he underwent a repeat echocardiogram which showed an EF of greater than 55%. There was mild to moderate mitral regurgitation with an eccentrically directed mitral regurgitant jet.  There was also an eccentric intravascular regurgitant jet. There were mildly increased gradients noted after annuloplasty with a mean gradient of 3.5 mmHg at a heart rate of 77.   Echocardiogram this admission demonstratedLeft ventricle: The cavity size was normal. Wall thickness was normal. Systolic function was normal. The estimated ejection fraction was in the range of 55% to 60%. - Aortic valve: There was mild regurgitation. - Mitral valve: s/p MV annuloplasty. Echodenisity on atrial side of anterior mitrla leaflet probably reflects some prolapse, cannot completely exclude small mass There are several jets of MR, one posterior, one central, one anteiror. Pkea nad mean gradients through the valver are 9 and 3 mm respectively This is increased from gradients seen in 2014. There was moderate regurgitation. Valve area by continuity equation (using LVOT flow): 1.27 cm^2. - Left atrium: The atrium was severely dilated. - Right ventricle: The cavity size was moderately dilated. Wall thickness was normal. - Right atrium: The atrium was mildly dilated. - Pericardium, extracardiac: A trivial pericardial effusion was identified. .   Lab work is remarkable for negative troponin, stable CBC and CMP with elevated BUN at 25 but Cr 1.05.  Prior to admission, the patient denies chest pain, shortness of breath, dizziness, palpitations, or syncope.  They are recovering from their stroke with plans to return home at discharge.  EP has been asked to evaluate for placement of an implantable loop recorder to monitor for atrial fibrillation.  ROS is negative except as outlined above. Pt denies any chest pain or SOB. No swelling.   Past Medical History  Diagnosis Date  . Parkinson's disease   . S/P mitral valve replacement     mitral regurg,  endocarditis  . Dyslipidemia      Surgical History:  Past Surgical History  Procedure Laterality Date  . Mitral valve annuloplasty   04/10/2007     68m Edwards ring; r/t h/o MR and flail mitral leaflets due to endocarditis: MRI safe  . Cataract extraction  2009, 2012    right 2009, left 2012  . Transthoracic echocardiogram  08/15/2012    EF=>55%; normal LV systolic function; RV mildly dilated & systolic function mildly reduced; RA mod dilated; mild -mod MR & mildy increased gradients; mild TR; AV mildly sclerotic with mild-mod regurg     Prescriptions prior to admission  Medication Sig Dispense Refill Last Dose  . acetaminophen (TYLENOL) 325 MG tablet Take by mouth every 6 (six) hours as needed for headache.   Past Month at Unknown time  . amantadine (SYMMETREL) 100 MG capsule Take 100 mg by mouth 2 (two) times daily.   03/05/2015 at 1800  . aspirin 81 MG tablet Take 81 mg by mouth every evening.    03/04/2015 at Unknown time  . Cholecalciferol (VITAMIN D PO) Take by mouth daily.   03/05/2015 at Unknown time  . Cyanocobalamin (VITAMIN B-12 CR PO) Take by mouth daily.   03/05/2015 at Unknown time  . Docusate Calcium (STOOL SOFTENER PO) Take by mouth as needed.   Past Week at Unknown time  . Multiple Vitamins-Minerals (MULTIVITAMIN PO) Take by mouth.   03/05/2015 at Unknown time  . rasagiline (AZILECT) 1 MG TABS tablet Take 1 mg by mouth every morning. .   03/05/2015 at Unknown time  . rotigotine (NEUPRO) 4 MG/24HR Place 1 patch onto the skin daily.   03/05/2015 at Unknown time  . RYTARY 48.75-195 MG CPCR Take 9 capsules by mouth See admin instructions. 3 caps in the morning with Azelect, 3 caps at 1pm with Amantadine, 2 caps at 6pm with Amantadine and 1 cap at bedtime.   03/05/2015 at Unknown time  . VITAMIN E PO Take by mouth daily.   03/05/2015 at Unknown time  . [DISCONTINUED] atorvastatin (LIPITOR) 20 MG tablet Take 20 mg by mouth daily.   03/04/2015 at Unknown time  . AMOXICILLIN PO Take 500 mg by mouth. Prior to dental procedures   PRN  . ibandronate (BONIVA) 150 MG tablet Take 1 tablet by mouth every 30 (thirty) days.    02/07/2015    Inpatient Medications:  . amantadine  100 mg Oral BID  . aspirin  300 mg Rectal Daily   Or  . aspirin  325 mg Oral Daily  . atorvastatin  20 mg Oral Daily  . Carbidopa-Levodopa ER  3 capsule Oral BID  . enoxaparin (LOVENOX) injection  40 mg Subcutaneous Q24H  . rasagiline  1 mg Oral Daily  . rotigotine  1 patch Transdermal Daily    Allergies:  Allergies  Allergen Reactions  . Ace Inhibitors Cough  . Demerol [Meperidine]     unknown    History   Social History  . Marital Status: Married    Spouse Name: N/A  . Number of Children: 3  . Years of Education: law school   Occupational History  . retired    Social History Main Topics  . Smoking status: Never Smoker   . Smokeless tobacco: Never Used  . Alcohol Use: Not on file  . Drug Use: No  . Sexual Activity: Not on file   Other Topics Concern  . Not on file   Social History Narrative     Family  History  Problem Relation Age of Onset  . Stroke Mother     cerebral hemorrahge  . Heart attack Mother   . Hypertension Brother   . Valvular heart disease Brother     also MI  . Prostate cancer Father      Physical Exam: Filed Vitals:   03/06/15 2205 03/07/15 0129 03/07/15 0522 03/07/15 0900  BP: 92/57 112/73 108/68 100/66  Pulse: 66 66 72 72  Temp: 97.5 F (36.4 C) 97.9 F (36.6 C) 98.1 F (36.7 C) 98.1 F (36.7 C)  TempSrc: Oral Oral Oral Oral  Resp: 18 17 17 18   Height:      Weight:      SpO2: 98% 98% 97% 99%    GEN- The patient is well appearing, alert and oriented x 3 today.   Head- normocephalic, atraumatic, rt lip droop Eyes-  Sclera clear, conjunctiva pink Ears- hearing intact Lungs- Clear to ausculation bilaterally, normal work of breathing Heart- Regular rate and rhythm, 3/6 systolic murmur,  No rubs or gallops, PMI not laterally displaced GI- soft, NT, ND, + BS Extremities- no clubbing, cyanosis, or edema MS- no significant deformity or atrophy Skin- no rash or  lesion Psych- euthymic mood, full affect   Labs:   Lab Results  Component Value Date   WBC 6.7 03/05/2015   HGB 16.7 03/05/2015   HCT 49.0 03/05/2015   MCV 96.3 03/05/2015   PLT 159 03/05/2015     Recent Labs Lab 03/05/15 2041 03/05/15 2046  NA 139 138  K 4.3 4.3  CL 103 104  CO2 28  --   BUN 25* 31*  CREATININE 1.05 1.10  CALCIUM 9.3  --   PROT 6.9  --   BILITOT 0.7  --   ALKPHOS 59  --   ALT 9  --   AST 26  --   GLUCOSE 98 96   No results found for: CKTOTAL, CKMB, CKMBINDEX, TROPONINI Lab Results  Component Value Date   CHOL 160 03/07/2015   Lab Results  Component Value Date   HDL 48 03/07/2015   Lab Results  Component Value Date   LDLCALC 93 03/07/2015   Lab Results  Component Value Date   TRIG 94 03/07/2015   Lab Results  Component Value Date   CHOLHDL 3.3 03/07/2015   No results found for: LDLDIRECT  No results found for: DDIMER   Radiology/Studies: Dg Chest 2 View  03/05/2015   CLINICAL DATA:  Facial droop.  CVA  EXAM: CHEST  2 VIEW  COMPARISON:  05/11/2007  FINDINGS: Chronic cardiomegaly. The patient is status post mitral valve replacement. Aortic tortuosity is prominent but stable from previous. Mild scarring again noted at the bases. There is no edema, consolidation, effusion, or pneumothorax.  IMPRESSION: Stable exam.  No active cardiopulmonary disease.   Electronically Signed   By: Monte Fantasia M.D.   On: 03/05/2015 23:58   Ct Head (brain) Wo Contrast  03/05/2015   CLINICAL DATA:  Right-sided facial droop and slurred speech.  EXAM: CT HEAD WITHOUT CONTRAST  TECHNIQUE: Contiguous axial images were obtained from the base of the skull through the vertex without intravenous contrast.  COMPARISON:  None.  FINDINGS: Age related cerebral atrophy, ventriculomegaly and periventricular white matter disease. There is a remote appearing lacunar type basal ganglia infarct on the right side. No CT findings for acute hemispheric infarction or intracranial  hemorrhage. No mass lesions. No extra-axial fluid collections. The brainstem and cerebellum are grossly normal.  No  significant bony findings. The paranasal sinuses and mastoid air cells are clear. The globes are intact.  IMPRESSION: Age related cerebral atrophy, ventriculomegaly and periventricular white matter disease.  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  No findings for acute hemispheric infarction or intracranial hemorrhage.   Electronically Signed   By: Marijo Sanes M.D.   On: 03/05/2015 21:02   Mr Brain Wo Contrast  03/06/2015   CLINICAL DATA:  Initial evaluation for acute speech difficulty, history of Parkinson's disease.  EXAM: MRI HEAD WITHOUT CONTRAST  MRA HEAD WITHOUT CONTRAST  TECHNIQUE: Multiplanar, multiecho pulse sequences of the brain and surrounding structures were obtained without intravenous contrast. Angiographic images of the head were obtained using MRA technique without contrast.  COMPARISON:  Prior CT from 03/05/2015.  FINDINGS: MRI HEAD FINDINGS  Diffuse prominence of the CSF containing spaces is compatible with generalized cerebral atrophy. No significant white matter changes present for patient age.  There are small remote infarcts involving the right frontal cortex (series 8, image 21). As well as the posterior left frontal cortex (series 8, image 20).  On DWI sequence, there is a single subtle 5 mm focus of increased signal intensity involving the posterior left frontal cortex in the region of the precentral gyrus (series 3, image 33 are on axial sequence, series 5, image 15 on coronal sequence). No definite hyperintense signal intensity seen on ADC maps is suggestive this T2 shine through. No definite signal abnormality seen within this region on corresponding T2 or FLAIR weighted sequence. Finding may reflect a small subtle cortical infarct versus artifact.  No other abnormal foci of restricted diffusion to suggest acute intracranial infarct  identified. Gray-white matter differentiation otherwise maintained. No acute intracranial hemorrhage. Single small subcentimeter chronic micro hemorrhage noted within the left occipital lobe.  No mass lesion or midline shift. No hydrocephalus. No extra-axial fluid collection.  Craniocervical junction within normal limits. Pituitary gland normal. No acute abnormality seen about the orbits.  There is scattered opacity within the inferior right frontal sinus. Paranasal sinuses are otherwise clear. No mastoid effusion. Inner ear structures are normal.  Bone marrow signal intensity within normal limits. Scattered degenerative changes noted within the upper cervical spine.  MRA HEAD FINDINGS  ANTERIOR CIRCULATION:  Visualized portions of the distal cervical segments of the internal carotid arteries are widely patent with antegrade flow. The petrous, cavernous, and supra clinoid segments are widely patent. A1 segments, anterior communicating artery, and anterior cerebral arteries well opacified. M1 segments widely patent without stenosis or occlusion. MCA bifurcations normal. MCA branches symmetric and normal in appearance bilaterally.  POSTERIOR CIRCULATION:  Both vertebral arteries well opacified to the vertebrobasilar junction without stenosis or occlusion. Posterior inferior cerebral arteries not well evaluated on this exam. Basilar artery widely patent. Superior cerebellar and posterior cerebral arteries well opacified bilaterally without stenosis or occlusion. Anterior inferior cerebral arteries are patent proximally.  No aneurysm or vascular malformation.  IMPRESSION: MRI HEAD IMPRESSION:  1. Subtly increased 5 mm focus of high signal intensity within the posterior left frontal lobe on DWI sequence as above. Finding is favored to be artifactual in nature, although possible small acute ischemic infarct is not entirely excluded. Correlation with physical exam and history for possible acute ischemia in this territory  is recommended. 2. No other acute intracranial infarct or other abnormality identified. 3. Small remote anterior right frontal and posterior left frontal cortical infarcts as above. 4. Generalized cerebral atrophy.  MRA HEAD IMPRESSION:  Unremarkable MRA  of the intracranial circulation without proximal branch occlusion or hemodynamically significant stenosis.   Electronically Signed   By: Jeannine Boga M.D.   On: 03/06/2015 05:35   Mr Jodene Nam Head/brain Wo Cm  03/06/2015   CLINICAL DATA:  Initial evaluation for acute speech difficulty, history of Parkinson's disease.  EXAM: MRI HEAD WITHOUT CONTRAST  MRA HEAD WITHOUT CONTRAST  TECHNIQUE: Multiplanar, multiecho pulse sequences of the brain and surrounding structures were obtained without intravenous contrast. Angiographic images of the head were obtained using MRA technique without contrast.  COMPARISON:  Prior CT from 03/05/2015.  FINDINGS: MRI HEAD FINDINGS  Diffuse prominence of the CSF containing spaces is compatible with generalized cerebral atrophy. No significant white matter changes present for patient age.  There are small remote infarcts involving the right frontal cortex (series 8, image 21). As well as the posterior left frontal cortex (series 8, image 20).  On DWI sequence, there is a single subtle 5 mm focus of increased signal intensity involving the posterior left frontal cortex in the region of the precentral gyrus (series 3, image 33 are on axial sequence, series 5, image 15 on coronal sequence). No definite hyperintense signal intensity seen on ADC maps is suggestive this T2 shine through. No definite signal abnormality seen within this region on corresponding T2 or FLAIR weighted sequence. Finding may reflect a small subtle cortical infarct versus artifact.  No other abnormal foci of restricted diffusion to suggest acute intracranial infarct identified. Gray-white matter differentiation otherwise maintained. No acute intracranial hemorrhage.  Single small subcentimeter chronic micro hemorrhage noted within the left occipital lobe.  No mass lesion or midline shift. No hydrocephalus. No extra-axial fluid collection.  Craniocervical junction within normal limits. Pituitary gland normal. No acute abnormality seen about the orbits.  There is scattered opacity within the inferior right frontal sinus. Paranasal sinuses are otherwise clear. No mastoid effusion. Inner ear structures are normal.  Bone marrow signal intensity within normal limits. Scattered degenerative changes noted within the upper cervical spine.  MRA HEAD FINDINGS  ANTERIOR CIRCULATION:  Visualized portions of the distal cervical segments of the internal carotid arteries are widely patent with antegrade flow. The petrous, cavernous, and supra clinoid segments are widely patent. A1 segments, anterior communicating artery, and anterior cerebral arteries well opacified. M1 segments widely patent without stenosis or occlusion. MCA bifurcations normal. MCA branches symmetric and normal in appearance bilaterally.  POSTERIOR CIRCULATION:  Both vertebral arteries well opacified to the vertebrobasilar junction without stenosis or occlusion. Posterior inferior cerebral arteries not well evaluated on this exam. Basilar artery widely patent. Superior cerebellar and posterior cerebral arteries well opacified bilaterally without stenosis or occlusion. Anterior inferior cerebral arteries are patent proximally.  No aneurysm or vascular malformation.  IMPRESSION: MRI HEAD IMPRESSION:  1. Subtly increased 5 mm focus of high signal intensity within the posterior left frontal lobe on DWI sequence as above. Finding is favored to be artifactual in nature, although possible small acute ischemic infarct is not entirely excluded. Correlation with physical exam and history for possible acute ischemia in this territory is recommended. 2. No other acute intracranial infarct or other abnormality identified. 3. Small remote  anterior right frontal and posterior left frontal cortical infarcts as above. 4. Generalized cerebral atrophy.  MRA HEAD IMPRESSION:  Unremarkable MRA of the intracranial circulation without proximal branch occlusion or hemodynamically significant stenosis.   Electronically Signed   By: Jeannine Boga M.D.   On: 03/06/2015 05:35    12-lead ECG SR with PVC 1st  degree AV block PR 253m All prior EKG's in EPIC reviewed with no documented atrial fibrillation  Telemetry SR with PVCs , no atrial fib  Assessment and Plan:  1. Cryptogenic stroke The patient presents with cryptogenic stroke.  The patient has a TEE planned for this AM.  I spoke at length with the patient about monitoring for afib with either a 30 day event monitor or an implantable loop recorder.  Risks, benefits, and alteratives to implantable loop recorder were discussed with the patient today. Pt does not wish to have either study done.  He would like to go home.  I have explained abnormal Echo to pt and wife and that a cardiology consult will be done and further recommendations will be decided at that time.  Pt was on way out of hospital and has agreed to stay until MD can discuss and we can arrange outpt follow up.  Reviewing abnormal Echo I asked Dr. MJerilynn Mages Croitoru to review and multiple issues are in play, he has perivalvular leak, possible endocarditis.  Dr. MBertrum Solrecommends cardiology consult and further plans per General Cardiology.  2. S/P MVRepair in 2008- due to endocarditis--- now with Mitral valve: s/p MV annuloplasty. Echodenisity on atrial side of anterior mitrla leaflet probably reflects some prolapse, cannot completely exclude small mass There are several jets of MR, one posterior, one central, one anteiror. Pkea nad mean gradients through the valver are 9 and 3 mm respectively This is increased from gradients seen in 2014. Doppler: There was moderate regurgitation.  Valve area by continuity equation  (using LVOT flow): 1.27 cm^2. Indexed valve area by continuity equation (using LVOT flow): 0.64 cm^2/m^2. Mean gradient (D): 3 mm Hg. Peak gradient (D): 6 mm Hg.   03/07/2015 10:57 AM  LCecilie Kicks NP-C  Patient seen and examined with LPaul Half NP-C. Echo images reviewed personally with Dr. CRecardo Evangelist We discussed all aspects of the encounter. I agree with the assessment and plan as stated above.   81y/o with Parkinson's disease and previous MV repair in 2008 admitted with cryptogenic stroke. Carotids ok. No known AF. I had a long talk with Mr. MPalazziand his family and reviewed echo results with them.   I discussed possible causes for stroke including PAF and valvular emboli. On echo, there is a small mobile density around the MV ring which I suspect is a torn stitch which is associated with mild to moderate peri-valvular MR. The valve dances a bit but is not unstable. This is slightly worse than last year. I discussed the fact that this could just be a mechanical complication but recurrent endocarditis is also a possibility. Fortunately has not had any infectious symptoms or any other embolic phenomenon. I have recommended TEE to further evaluated\ and he has agreed to proceed but wants to wait until next week, which I think is ok. He knows to return for fever or chills or recurrent CVA symptoms. Will also plan ILR implant at that time to assess for silent AF. Of note, he refuses any further heart surgery even if valve were to become unstable. Procedures to be scheduled for next Thursday at his request. OK for discharge today. Will check ESR.  Daniel Bensimhon,MD 12:00 PM

## 2015-03-07 NOTE — Discharge Instructions (Signed)
AMA  You have told me that you desire to leave the Hospital immidiately, you have been warned that this is not Medically advisable at this time, and can result in Medical complications like Death and Disability, you and your wife understand and accept the risks involved and assume full responsibilty of this decision.   Leroy SeaSINGH,Anyely Cunning K M.D on 03/07/2015 at 9:13 AM  [facility]    Last Note Below  Follow with Primary MD Rodrigo RanPERINI,MARK A, MD in 7 days   Get CBC, CMP, 2 view Chest X ray checked  by Primary MD next visit.    Activity: As tolerated with Full fall precautions use walker/cane & assistance as needed   Disposition Home      Diet: Heart Healthy    For Heart failure patients - Check your Weight same time everyday, if you gain over 2 pounds, or you develop in leg swelling, experience more shortness of breath or chest pain, call your Primary MD immediately. Follow Cardiac Low Salt Diet and 1.5 lit/day fluid restriction.   On your next visit with your primary care physician please Get Medicines reviewed and adjusted.   Please request your Prim.MD to go over all Hospital Tests and Procedure/Radiological results at the follow up, please get all Hospital records sent to your Prim MD by signing hospital release before you go home.   If you experience worsening of your admission symptoms, develop shortness of breath, life threatening emergency, suicidal or homicidal thoughts you must seek medical attention immediately by calling 911 or calling your MD immediately  if symptoms less severe.  You Must read complete instructions/literature along with all the possible adverse reactions/side effects for all the Medicines you take and that have been prescribed to you. Take any new Medicines after you have completely understood and accpet all  the possible adverse reactions/side effects.   Do not drive, operating heavy machinery, perform activities at heights, swimming or participation in water activities or provide baby sitting services if your were admitted for syncope or siezures until you have seen by Primary MD or a Neurologist and advised to do so again.  Do not drive when taking Pain medications.    Do not take more than prescribed Pain, Sleep and Anxiety Medications  Special Instructions: If you have smoked or chewed Tobacco  in the last 2 yrs please stop smoking, stop any regular Alcohol  and or any Recreational drug use.  Wear Seat belts while driving.   Please note  You were cared for by a hospitalist during your hospital stay. If you have any questions about your discharge medications or the care you received while you were in the hospital after you are discharged, you can call the unit and asked to speak with the hospitalist on call if the hospitalist that took care of you is not available. Once you are discharged, your primary care physician will handle any further medical issues. Please note that NO REFILLS for any discharge medications will be authorized once you are discharged, as it is imperative that you return to your primary care physician (or establish a relationship with a primary care physician if you do not have one)  for your aftercare needs so that they can reassess your need for medications and monitor your lab values.

## 2015-03-07 NOTE — Discharge Summary (Signed)
Left AMA     [name] [name], is a 81 y.o. male  DOB [birth year]  MRN [id number].  Admission date:  03/05/2015  Admitting Physician  Eduard Clos, MD  Discharge Date:  03/07/2015   Primary MD  Ezequiel Kayser, MD  Recommendations for primary care physician for things to follow:   Cards and Neuro follow up for TTE review ? Clot, left AMA  Check A1c, CVA risk factors closely   Admission Diagnosis  CVA (cerebral infarction) [I63.9] Transient cerebral ischemia, unspecified transient cerebral ischemia type [G45.9]   Discharge Diagnosis  CVA (cerebral infarction) [I63.9] Transient cerebral ischemia, unspecified transient cerebral ischemia type [G45.9]    Principal Problem:   TIA (transient ischemic attack) Active Problems:   Parkinson's disease   Dyslipidemia   S/P mitral valve repair   Slurred speech   CVA (cerebral infarction)      Past Medical History  Diagnosis Date  . Parkinson's disease   . S/P mitral valve replacement     mitral regurg, endocarditis  . Dyslipidemia     Past Surgical History  Procedure Laterality Date  . Mitral valve annuloplasty  04/10/2007     26mm Edwards ring; r/t h/o MR and flail mitral leaflets due to endocarditis: MRI safe  . Cataract extraction  2009, 2012    right 2009, left 2012  . Transthoracic echocardiogram  08/15/2012    EF=>55%; normal LV systolic function; RV mildly dilated & systolic function mildly reduced; RA mod dilated; mild -mod MR & mildy increased gradients; mild TR; AV mildly sclerotic with mild-mod regurg       History of present illness and  Hospital Course:     Kindly see H&P for  history of present illness and admission details, please review complete Labs, Consult reports and Test reports for all details in brief  HPI  from the history and physical done on the day of admission  [name] [name] is a 81 y.o. male with Past medical history of Parkinson's disease, mitral valve repair, dyslipidemia. The patient is presenting with complaints  of speech difficulty. Patient was at a restaurant eating with his wife and suddenly started having drooling of saliva from the angle of the left side of his mouth. Neck slight later on he started having difficulty speaking with slurred speech. He did not have any dizziness or lightheadedness or headache or blurred vision or chest pain or shortness of breath or abdominal pain. He denies having any nausea vomiting diarrhea constipation or burning urination recently. He also denies any recent change in his medications. He denies any similar symptoms in the past. He mentions he has been compliant with all his medications. He also had occasional weakness of his right arm prior to his arrival. At the time of my evaluation he mentions his speech has cleared but he continues to have droopiness of the left angle of the mouth   Hospital Course     1. Mild dysarthria and facial droop likely due to left frontal CVA versus TIA. Full stroke workup underway, neurology following, currently on aspirin full dose per neurology along with creased dose of statin due to LDL being above: 70. Pending A1c will request PCP to monitor A1c levels, was seen by PT-OT-speech and neurology, carotid duplex was unremarkable however echogram raise the suspicion of a possible mass around the mitral valve area. A TEE was recommended however patient and wife refused the procedure this morning and expressed her wishes to be discharged AGAINST MEDICAL ADVICE.  He was very calm and pleasant about it and his wife bedside was the same, they said they have made an educated decision, at  his age he does not want any invasive procedures, he completely understands the risk of getting another life-threatening stroke or even dying. He accepts and resumes all responsibility. Wife in the room.  No results found for: HGBA1C  Lab Results  Component Value Date   CHOL 160 03/07/2015   HDL 48 03/07/2015   LDLCALC 93 03/07/2015   TRIG 94 03/07/2015   CHOLHDL 3.3 03/07/2015     2. Dyslipidemia continue home dose statin. Pending lipid panel.   3. Parkinson's. Continue home medications no acute issues.   4. Mitral valve repair. Mild systolic murmur. Current echo reviewed, TEE was requested by neurology unfortunately patient refused as in above.    Discharge Condition: Leaving AMA - guarded   Follow UP  Follow-up Information    Follow up with PERINI,MARK A, MD. Schedule an appointment as soon as possible for a visit in 1 week.   Specialty:  Internal Medicine   Contact information:   [address] [place] [address] [phone]       Follow up with Chrystie Nose, MD. Schedule an appointment as soon as possible for a visit in 1 week.   Specialty:  Cardiology   Why:  review echo   Contact information:   [address] [place] [address] [phone]       Follow up with [facility]. Schedule an appointment as soon as possible for a visit in 1 week.   Why:  CVA   Contact information:   [address] [employer] [place] [address] [phone]        Discharge Instructions  and  Discharge Medications          Discharge Instructions    Diet - low sodium heart healthy    Complete by:  As directed      Discharge instructions    Complete by:  As directed   AMA  You have told me that you desire to leave the Hospital immidiately, you have been warned that this is not Medically advisable at this time, and can result in Medical complications like Death and Disability, you and your wife understand and  accept the risks involved and assume full responsibilty of this decision.   Leroy Sea M.D on 03/07/2015 at 9:13 AM  [facility]    Last Note Below  Follow with Primary MD Rodrigo Ran A, MD in 7 days   Get CBC, CMP, 2 view Chest X ray checked  by Primary MD next visit.    Activity: As tolerated with Full fall precautions use walker/cane & assistance as needed   Disposition Home      Diet: Heart Healthy    For Heart failure patients - Check your Weight same time everyday, if you gain over 2 pounds, or you develop in leg swelling, experience more shortness of breath or chest pain, call your Primary MD immediately. Follow Cardiac Low Salt Diet and 1.5 lit/day fluid restriction.   On your next visit with your primary care physician please Get Medicines reviewed and adjusted.   Please request your Prim.MD to go over all Hospital Tests and Procedure/Radiological results at the follow up, please get all Hospital records sent to your Prim MD by signing hospital release before you go home.   If you experience worsening of your admission symptoms, develop shortness of breath, life threatening emergency, suicidal or homicidal thoughts you must seek medical attention immediately by calling 911 or calling your MD immediately  if symptoms less severe.  You Must read complete instructions/literature along with all the possible adverse reactions/side effects for all the Medicines you take and that have been prescribed to you. Take any new Medicines after you have completely understood and accpet all the possible adverse reactions/side effects.   Do not drive, operating heavy machinery, perform activities at heights, swimming or participation in water activities or provide baby sitting services if your were admitted for syncope or siezures until you have seen by Primary MD or a Neurologist and advised to do so again.  Do not drive when taking Pain medications.    Do not take  more than prescribed Pain, Sleep and Anxiety Medications  Special Instructions: If you have smoked or chewed Tobacco  in the last 2 yrs please stop smoking, stop any regular Alcohol  and or any Recreational drug use.  Wear Seat belts while driving.   Please note  You were cared for by a hospitalist during your hospital stay. If you have any questions about your discharge medications or the care you received while you were in the hospital after you are discharged, you can call the unit and asked to speak with the hospitalist on call if the hospitalist that took care of you is not available. Once you are discharged, your primary care physician will handle any further medical issues. Please note that NO REFILLS for any discharge medications will be authorized once you are discharged, as it is imperative that you return to your primary care physician (or establish a relationship with a primary care physician if you do not have one) for your aftercare needs so that they can reassess your need for medications and monitor your lab values.     Increase activity slowly    Complete by:  As directed             Medication List    TAKE these medications  acetaminophen 325 MG tablet  Commonly known as:  TYLENOL  Take by mouth every 6 (six) hours as needed for headache.     amantadine 100 MG capsule  Commonly known as:  SYMMETREL  Take 100 mg by mouth 2 (two) times daily.     AMOXICILLIN PO  Take 500 mg by mouth. Prior to dental procedures     aspirin 325 MG tablet  Take 1 tablet (325 mg total) by mouth daily.     atorvastatin 40 MG tablet  Commonly known as:  LIPITOR  Take 1 tablet (40 mg total) by mouth daily.     AZILECT 1 MG Tabs tablet  Generic drug:  rasagiline  Take 1 mg by mouth every morning. .     ibandronate 150 MG tablet  Commonly known as:  BONIVA  Take 1 tablet by mouth every 30 (thirty) days.     MULTIVITAMIN PO  Take by mouth.     rotigotine 4 MG/24HR    Commonly known as:  NEUPRO  Place 1 patch onto the skin daily.     RYTARY 48.75-195 MG Cpcr  Generic drug:  Carbidopa-Levodopa ER  Take 9 capsules by mouth See admin instructions. 3 caps in the morning with Azelect, 3 caps at 1pm with Amantadine, 2 caps at 6pm with Amantadine and 1 cap at bedtime.     STOOL SOFTENER PO  Take by mouth as needed.     VITAMIN B-12 CR PO  Take by mouth daily.     VITAMIN D PO  Take by mouth daily.     VITAMIN E PO  Take by mouth daily.          Diet and Activity recommendation: See Discharge Instructions above   Consults obtained - Neuro, Cards   Major procedures and Radiology Reports - PLEASE review detailed and final reports for all details, in brief -    Vascular Ultrasound Carotid Duplex (Doppler) has been completed. Preliminary findings: Bilateral: 1-39% ICA stenosis. Vertebral artery flow is antegrade.   TTE  - Left ventricle: The cavity size was normal. Wall thickness was normal. Systolic function was normal. The estimated ejectionfraction was in the range of 55% to 60%. - Aortic valve: There was mild regurgitation. - Mitral valve: s/p MV annuloplasty. Echodenisity on atrial side ofanterior mitrla leaflet probably reflects some prolapse, cannotcompletely exclude small mass There are several jets of MR, one posterior, one central, oneanteiror.Pkea nad mean gradients through the valver are 9 and 3 mm respectively This is increased from gradients seen in 2014. Therewas moderate regurgitation. Valve area by continuity equation(using LVOT flow): 1.27 cm^2. - Left atrium: The atrium was severely dilated. - Right ventricle: The cavity size was moderately dilated. Wallthickness was normal. - Right atrium: The atrium was mildly dilated. - Pericardium, extracardiac: A trivial pericardial effusion was identified.   Dg Chest 2 View  03/05/2015   CLINICAL DATA:  Facial droop.  CVA  EXAM: CHEST  2 VIEW  COMPARISON:  05/11/2007   FINDINGS: Chronic cardiomegaly. The patient is status post mitral valve replacement. Aortic tortuosity is prominent but stable from previous. Mild scarring again noted at the bases. There is no edema, consolidation, effusion, or pneumothorax.  IMPRESSION: Stable exam.  No active cardiopulmonary disease.   Electronically Signed   By: Marnee SpringJonathon  Watts M.D.   On: 03/05/2015 23:58   Ct Head (brain) Wo Contrast  03/05/2015   CLINICAL DATA:  Right-sided facial droop and slurred speech.  EXAM: CT HEAD WITHOUT CONTRAST  TECHNIQUE: Contiguous axial images were obtained from the base of the skull through the vertex without intravenous contrast.  COMPARISON:  None.  FINDINGS: Age related cerebral atrophy, ventriculomegaly and periventricular white matter disease. There is a remote appearing lacunar type basal ganglia infarct on the right side. No CT findings for acute hemispheric infarction or intracranial hemorrhage. No mass lesions. No extra-axial fluid collections. The brainstem and cerebellum are grossly normal.  No significant bony findings. The paranasal sinuses and mastoid air cells are clear. The globes are intact.  IMPRESSION: Age related cerebral atrophy, ventriculomegaly and periventricular white matter disease.  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  No findings for acute hemispheric infarction or intracranial hemorrhage.   Electronically Signed   By: Rudie Meyer M.D.   On: 03/05/2015 21:02   Mr Brain Wo Contrast  03/06/2015   CLINICAL DATA:  Initial evaluation for acute speech difficulty, history of Parkinson's disease.  EXAM: MRI HEAD WITHOUT CONTRAST  MRA HEAD WITHOUT CONTRAST  TECHNIQUE: Multiplanar, multiecho pulse sequences of the brain and surrounding structures were obtained without intravenous contrast. Angiographic images of the head were obtained using MRA technique without contrast.  COMPARISON:  Prior CT from 03/05/2015.  FINDINGS: MRI HEAD FINDINGS  Diffuse  prominence of the CSF containing spaces is compatible with generalized cerebral atrophy. No significant white matter changes present for patient age.  There are small remote infarcts involving the right frontal cortex (series 8, image 21). As well as the posterior left frontal cortex (series 8, image 20).  On DWI sequence, there is a single subtle 5 mm focus of increased signal intensity involving the posterior left frontal cortex in the region of the precentral gyrus (series 3, image 33 are on axial sequence, series 5, image 15 on coronal sequence). No definite hyperintense signal intensity seen on ADC maps is suggestive this T2 shine through. No definite signal abnormality seen within this region on corresponding T2 or FLAIR weighted sequence. Finding may reflect a small subtle cortical infarct versus artifact.  No other abnormal foci of restricted diffusion to suggest acute intracranial infarct identified. Gray-white matter differentiation otherwise maintained. No acute intracranial hemorrhage. Single small subcentimeter chronic micro hemorrhage noted within the left occipital lobe.  No mass lesion or midline shift. No hydrocephalus. No extra-axial fluid collection.  Craniocervical junction within normal limits. Pituitary gland normal. No acute abnormality seen about the orbits.  There is scattered opacity within the inferior right frontal sinus. Paranasal sinuses are otherwise clear. No mastoid effusion. Inner ear structures are normal.  Bone marrow signal intensity within normal limits. Scattered degenerative changes noted within the upper cervical spine.  MRA HEAD FINDINGS  ANTERIOR CIRCULATION:  Visualized portions of the distal cervical segments of the internal carotid arteries are widely patent with antegrade flow. The petrous, cavernous, and supra clinoid segments are widely patent. A1 segments, anterior communicating artery, and anterior cerebral arteries well opacified. M1 segments widely patent without  stenosis or occlusion. MCA bifurcations normal. MCA branches symmetric and normal in appearance bilaterally.  POSTERIOR CIRCULATION:  Both vertebral arteries well opacified to the vertebrobasilar junction without stenosis or occlusion. Posterior inferior cerebral arteries not well evaluated on this exam. Basilar artery widely patent. Superior cerebellar and posterior cerebral arteries well opacified bilaterally without stenosis or occlusion. Anterior inferior cerebral arteries are patent proximally.  No aneurysm or vascular malformation.  IMPRESSION: MRI HEAD IMPRESSION:  1. Subtly increased 5 mm focus of high signal intensity within the posterior left frontal  lobe on DWI sequence as above. Finding is favored to be artifactual in nature, although possible small acute ischemic infarct is not entirely excluded. Correlation with physical exam and history for possible acute ischemia in this territory is recommended. 2. No other acute intracranial infarct or other abnormality identified. 3. Small remote anterior right frontal and posterior left frontal cortical infarcts as above. 4. Generalized cerebral atrophy.  MRA HEAD IMPRESSION:  Unremarkable MRA of the intracranial circulation without proximal branch occlusion or hemodynamically significant stenosis.   Electronically Signed   By: Rise Mu M.D.   On: 03/06/2015 05:35   Mr Maxine Glenn Head/brain Wo Cm  03/06/2015   CLINICAL DATA:  Initial evaluation for acute speech difficulty, history of Parkinson's disease.  EXAM: MRI HEAD WITHOUT CONTRAST  MRA HEAD WITHOUT CONTRAST  TECHNIQUE: Multiplanar, multiecho pulse sequences of the brain and surrounding structures were obtained without intravenous contrast. Angiographic images of the head were obtained using MRA technique without contrast.  COMPARISON:  Prior CT from 03/05/2015.  FINDINGS: MRI HEAD FINDINGS  Diffuse prominence of the CSF containing spaces is compatible with generalized cerebral atrophy. No  significant white matter changes present for patient age.  There are small remote infarcts involving the right frontal cortex (series 8, image 21). As well as the posterior left frontal cortex (series 8, image 20).  On DWI sequence, there is a single subtle 5 mm focus of increased signal intensity involving the posterior left frontal cortex in the region of the precentral gyrus (series 3, image 33 are on axial sequence, series 5, image 15 on coronal sequence). No definite hyperintense signal intensity seen on ADC maps is suggestive this T2 shine through. No definite signal abnormality seen within this region on corresponding T2 or FLAIR weighted sequence. Finding may reflect a small subtle cortical infarct versus artifact.  No other abnormal foci of restricted diffusion to suggest acute intracranial infarct identified. Gray-white matter differentiation otherwise maintained. No acute intracranial hemorrhage. Single small subcentimeter chronic micro hemorrhage noted within the left occipital lobe.  No mass lesion or midline shift. No hydrocephalus. No extra-axial fluid collection.  Craniocervical junction within normal limits. Pituitary gland normal. No acute abnormality seen about the orbits.  There is scattered opacity within the inferior right frontal sinus. Paranasal sinuses are otherwise clear. No mastoid effusion. Inner ear structures are normal.  Bone marrow signal intensity within normal limits. Scattered degenerative changes noted within the upper cervical spine.  MRA HEAD FINDINGS  ANTERIOR CIRCULATION:  Visualized portions of the distal cervical segments of the internal carotid arteries are widely patent with antegrade flow. The petrous, cavernous, and supra clinoid segments are widely patent. A1 segments, anterior communicating artery, and anterior cerebral arteries well opacified. M1 segments widely patent without stenosis or occlusion. MCA bifurcations normal. MCA branches symmetric and normal in  appearance bilaterally.  POSTERIOR CIRCULATION:  Both vertebral arteries well opacified to the vertebrobasilar junction without stenosis or occlusion. Posterior inferior cerebral arteries not well evaluated on this exam. Basilar artery widely patent. Superior cerebellar and posterior cerebral arteries well opacified bilaterally without stenosis or occlusion. Anterior inferior cerebral arteries are patent proximally.  No aneurysm or vascular malformation.  IMPRESSION: MRI HEAD IMPRESSION:  1. Subtly increased 5 mm focus of high signal intensity within the posterior left frontal lobe on DWI sequence as above. Finding is favored to be artifactual in nature, although possible small acute ischemic infarct is not entirely excluded. Correlation with physical exam and history for possible acute ischemia in this territory  is recommended. 2. No other acute intracranial infarct or other abnormality identified. 3. Small remote anterior right frontal and posterior left frontal cortical infarcts as above. 4. Generalized cerebral atrophy.  MRA HEAD IMPRESSION:  Unremarkable MRA of the intracranial circulation without proximal branch occlusion or hemodynamically significant stenosis.   Electronically Signed   By: Rise Mu M.D.   On: 03/06/2015 05:35    Micro Results      No results found for this or any previous visit (from the past 240 hour(s)).     Today   Subjective:   [name] [name] today has no headache,no chest abdominal pain,no new weakness tingling or numbness, feels much better wants to go home today.    Objective:   Blood pressure 125/62, pulse 72, temperature 98.1 F (36.7 C), temperature source Oral, resp. rate 18, height 6' (1.829 m), weight 77.2 kg (170 lb 3.1 oz), SpO2 99 %.   Intake/Output Summary (Last 24 hours) at 03/07/15 0951 Last data filed at 03/07/15 0700  Gross per 24 hour  Intake    360 ml  Output      0 ml  Net    360 ml    Exam Awake Alert, Oriented x 3, No new F.N  deficits, Normal affect, does have L. facial droop, [place].AT,PERRAL Supple Neck,No JVD, No cervical lymphadenopathy appriciated.  Symmetrical Chest wall movement, Good air movement bilaterally, CTAB RRR,No Gallops,Rubs or new Murmurs, No Parasternal Heave +ve B.Sounds, Abd Soft, Non tender, No organomegaly appriciated, No rebound -guarding or rigidity. No Cyanosis, Clubbing or edema, No new Rash or bruise  Data Review   CBC w Diff:  Lab Results  Component Value Date   WBC 6.7 03/05/2015   HGB 16.7 03/05/2015   HCT 49.0 03/05/2015   PLT 159 03/05/2015   LYMPHOPCT 24 03/05/2015   MONOPCT 10 03/05/2015   EOSPCT 2 03/05/2015   BASOPCT 0 03/05/2015    CMP:  Lab Results  Component Value Date   NA 138 03/05/2015   K 4.3 03/05/2015   CL 104 03/05/2015   CO2 28 03/05/2015   BUN 31* 03/05/2015   CREATININE 1.10 03/05/2015   PROT 6.9 03/05/2015   ALBUMIN 4.2 03/05/2015   BILITOT 0.7 03/05/2015   ALKPHOS 59 03/05/2015   AST 26 03/05/2015   ALT 9 03/05/2015  . Lab Results  Component Value Date   CHOL 160 03/07/2015   HDL 48 03/07/2015   LDLCALC 93 03/07/2015   TRIG 94 03/07/2015   CHOLHDL 3.3 03/07/2015   No results found for: HGBA1C   Total Time in preparing paper work, data evaluation and todays exam - 35 minutes  Leroy Sea M.D on 03/07/2015 at 9:51 AM  [facility]   Office  [phone]

## 2015-03-07 NOTE — Progress Notes (Signed)
Patient is scheduled to have TEE done today, but patient refused.Patient is alert and oriented x 4 and able to make decisions. Patient educated on the need for the procedure, but patient still refused and said he has had that in the past and it is an unpleasant experience and besides he is 81 years old. MD aware who also talked to the patient, but he still insists he wants to go against medical advise.

## 2015-03-08 ENCOUNTER — Other Ambulatory Visit: Payer: Self-pay | Admitting: Neurology

## 2015-03-08 DIAGNOSIS — I63412 Cerebral infarction due to embolism of left middle cerebral artery: Secondary | ICD-10-CM

## 2015-03-08 LAB — HEMOGLOBIN A1C
Hgb A1c MFr Bld: 6 % — ABNORMAL HIGH (ref 4.8–5.6)
Mean Plasma Glucose: 126 mg/dL

## 2015-03-10 ENCOUNTER — Telehealth: Payer: Self-pay | Admitting: Cardiology

## 2015-03-10 NOTE — Telephone Encounter (Signed)
Talked with wife and instructed her on NPO after MN for her husband.  And arrive at admitting at 8:00 am or a little before.  They will bring his meds from home so he can take his parkinson meds as soon as possible post procedure.  Linq depending on results of TEE.

## 2015-03-13 ENCOUNTER — Encounter (HOSPITAL_COMMUNITY)
Admission: RE | Disposition: A | Discharge status: Home / Self Care | Payer: Self-pay | Source: Ambulatory Visit | Attending: Cardiology

## 2015-03-13 ENCOUNTER — Ambulatory Visit (HOSPITAL_COMMUNITY)
Admission: RE | Admit: 2015-03-13 | Discharge: 2015-03-13 | Disposition: A | Discharge status: Home / Self Care | Payer: Medicare Other | Source: Ambulatory Visit | Attending: Cardiology | Admitting: Cardiology

## 2015-03-13 ENCOUNTER — Encounter (HOSPITAL_COMMUNITY): Payer: Self-pay | Admitting: *Deleted

## 2015-03-13 DIAGNOSIS — Z952 Presence of prosthetic heart valve: Secondary | ICD-10-CM | POA: Insufficient documentation

## 2015-03-13 DIAGNOSIS — I340 Nonrheumatic mitral (valve) insufficiency: Secondary | ICD-10-CM | POA: Diagnosis not present

## 2015-03-13 DIAGNOSIS — Z9889 Other specified postprocedural states: Secondary | ICD-10-CM

## 2015-03-13 DIAGNOSIS — E785 Hyperlipidemia, unspecified: Secondary | ICD-10-CM | POA: Diagnosis not present

## 2015-03-13 DIAGNOSIS — I639 Cerebral infarction, unspecified: Secondary | ICD-10-CM | POA: Diagnosis not present

## 2015-03-13 DIAGNOSIS — Z888 Allergy status to other drugs, medicaments and biological substances status: Secondary | ICD-10-CM | POA: Diagnosis not present

## 2015-03-13 DIAGNOSIS — G20 Parkinson's disease: Secondary | ICD-10-CM | POA: Insufficient documentation

## 2015-03-13 DIAGNOSIS — Z7982 Long term (current) use of aspirin: Secondary | ICD-10-CM | POA: Diagnosis not present

## 2015-03-13 HISTORY — PX: LOOP RECORDER IMPLANT: SHX5477

## 2015-03-13 HISTORY — PX: TEE WITHOUT CARDIOVERSION: SHX5443

## 2015-03-13 SURGERY — LOOP RECORDER IMPLANT
Anesthesia: LOCAL | Laterality: Left

## 2015-03-13 SURGERY — ECHOCARDIOGRAM, TRANSESOPHAGEAL
Anesthesia: Moderate Sedation

## 2015-03-13 MED ORDER — MIDAZOLAM HCL 10 MG/2ML IJ SOLN
INTRAMUSCULAR | Status: DC | PRN
  Administered 2015-03-13 (×2): 2 mg via INTRAVENOUS

## 2015-03-13 MED ORDER — FENTANYL CITRATE (PF) 100 MCG/2ML IJ SOLN
INTRAMUSCULAR | Status: DC | PRN
  Administered 2015-03-13 (×2): 25 ug via INTRAVENOUS

## 2015-03-13 MED ORDER — LIDOCAINE-EPINEPHRINE 1 %-1:100000 IJ SOLN
INTRAMUSCULAR | Status: AC
  Filled 2015-03-13: qty 1

## 2015-03-13 MED ORDER — MIDAZOLAM HCL 5 MG/ML IJ SOLN
INTRAMUSCULAR | Status: AC
  Filled 2015-03-13: qty 2

## 2015-03-13 MED ORDER — SODIUM CHLORIDE 0.9 % IV SOLN
INTRAVENOUS | Status: DC
  Administered 2015-03-13: 500 mL via INTRAVENOUS

## 2015-03-13 MED ORDER — FENTANYL CITRATE (PF) 100 MCG/2ML IJ SOLN
INTRAMUSCULAR | Status: AC
  Filled 2015-03-13: qty 2

## 2015-03-13 MED ORDER — BUTAMBEN-TETRACAINE-BENZOCAINE 2-2-14 % EX AERO
INHALATION_SPRAY | CUTANEOUS | Status: DC | PRN
  Administered 2015-03-13: 2 via TOPICAL

## 2015-03-13 NOTE — H&P (View-Only) (Signed)
STROKE TEAM PROGRESS NOTE   HISTORY Cherly HensenJohn Erhard is an 81 y.o. male with a history of Parkinson's disease, mitral valve replacement and dyslipidemia, who developed acute onset of right facial droop and speech output difficulty at 7:30 PM on 03/05/2015 ([facility]). There's no previous documentation of stroke nor TIA. Patient has been on aspirin for antiplatelet therapy. 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 PH improved to baseline in the emergency room. Mild right facial droop persisted. NIH stroke score at time of this evaluation was 2. MRI showed an area of abnormal signal on diffusion-weighted imaging negative of probable left posterior frontal small cortical acute infarction. Patient was not administered TPA secondary to rapidly resolving deficits. He was admitted for further evaluation and treatment.   SUBJECTIVE (INTERVAL HISTORY) His wife is at the bedside.  The plan is for discharge today. [facility] his [facility]'s disease. He will follow-up with Dr. Roda ShuttersXu for his stroke. He refused TEE today.    OBJECTIVE Temp:  [97.5 F (36.4 C)-98.2 F (36.8 C)] 98.1 F (36.7 C) (04/15 0900) Pulse Rate:  [66-72] 72 (04/15 0900) Cardiac Rhythm:  [-] Normal sinus rhythm (04/14 2000) Resp:  [16-18] 18 (04/15 0900) BP: (92-112)/(57-73) 100/66 mmHg (04/15 0900) SpO2:  [97 %-99 %] 99 % (04/15 0900)   Recent Labs Lab 03/05/15 2042 03/06/15 0031 03/06/15 1143 03/07/15 0523  GLUCAP 101* 108* 119* 95    Recent Labs Lab 03/05/15 2041 03/05/15 2046  NA 139 138  K 4.3 4.3  CL 103 104  CO2 28  --   GLUCOSE 98 96  BUN 25* 31*  CREATININE 1.05 1.10  CALCIUM 9.3  --     Recent Labs Lab 03/05/15 2041  AST 26  ALT 9  ALKPHOS 59  BILITOT 0.7  PROT 6.9  ALBUMIN 4.2    Recent Labs Lab 03/05/15 2041 03/05/15 2046  WBC 6.7  --   NEUTROABS 4.3  --   HGB 15.5 16.7  HCT 47.0 49.0  MCV 96.3  --   PLT  159  --    No results for input(s): CKTOTAL, CKMB, CKMBINDEX, TROPONINI in the last 168 hours.  Recent Labs  03/05/15 2041  LABPROT 13.5  INR 1.02   No results for input(s): COLORURINE, LABSPEC, PHURINE, GLUCOSEU, HGBUR, BILIRUBINUR, KETONESUR, PROTEINUR, UROBILINOGEN, NITRITE, LEUKOCYTESUR in the last 72 hours.  Invalid input(s): APPERANCEUR     Component Value Date/Time   CHOL 160 03/07/2015 0623   TRIG 94 03/07/2015 0623   HDL 48 03/07/2015 0623   CHOLHDL 3.3 03/07/2015 0623   VLDL 19 03/07/2015 0623   LDLCALC 93 03/07/2015 0623   No results found for: HGBA1C No results found for: LABOPIA, COCAINSCRNUR, LABBENZ, AMPHETMU, THCU, LABBARB  No results for input(s): ETH in the last 168 hours.  I have personally reviewed the radiological images below and agree with the radiology interpretations.  Dg Chest 2 View 03/05/2015    Stable exam.  No active cardiopulmonary disease.   Ct Head (brain) Wo Contrast 03/05/2015    Age related cerebral atrophy, ventriculomegaly and periventricular white matter disease.  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  No findings for acute hemispheric infarction or intracranial hemorrhage.    MRI HEAD 03/06/2015   1. Subtly increased 5 mm focus of high signal intensity within the posterior left frontal lobe on DWI sequence as above. Finding is  favored to be artifactual in nature, although possible small acute ischemic infarct is not entirely excluded. Correlation with physical exam and history for possible acute ischemia in this territory is recommended. 2. No other acute intracranial infarct or other abnormality identified. 3. Small remote anterior right frontal and posterior left frontal cortical infarcts as above. 4. Generalized cerebral atrophy.    MRA HEAD 03/06/2015    Unremarkable MRA of the intracranial circulation without proximal branch occlusion or hemodynamically significant stenosis.    CUS - Preliminary  findings: Bilateral: 1-39% ICA stenosis. Vertebral artery flow is antegrade.   2D echo  03/06/2015 Study Conclusions - Left ventricle: The cavity size was normal. Wall thickness was normal. Systolic function was normal. The estimated ejection fraction was in the range of 55% to 60%. - Aortic valve: There was mild regurgitation. - Mitral valve: s/p MV annuloplasty. Echodenisity on atrial side of anterior mitrla leaflet probably reflects some prolapse, cannot completely exclude small mass There are several jets of MR, one posterior, one central, one anteiror. Pkea nad mean gradients through the valver are 9 and 3 mm respectively This is increased from gradients seen in 2014. There was moderate regurgitation. Valve area by continuity equation (using LVOT flow): 1.27 cm^2. - Left atrium: The atrium was severely dilated. - Right ventricle: The cavity size was moderately dilated. Wall thickness was normal. - Right atrium: The atrium was mildly dilated. - Pericardium, extracardiac: A trivial pericardial effusion was identified.   TEE - pt refused.  LE venous doppler - pt refused.  PHYSICAL EXAM  Temp:  [97.5 F (36.4 C)-98.2 F (36.8 C)] 98.1 F (36.7 C) (04/15 0900) Pulse Rate:  [66-72] 72 (04/15 0900) Resp:  [16-18] 18 (04/15 0900) BP: (92-112)/(57-73) 100/66 mmHg (04/15 0900) SpO2:  [97 %-99 %] 99 % (04/15 0900)  General - Well nourished, well developed, in no apparent distress.  Ophthalmologic - Sharp disc margins OU.  Cardiovascular - Regular rate and rhythm with no murmur.  Mental Status -  Level of arousal and orientation to time, place, and person were intact. Language including expression, naming, repetition, comprehension was assessed and found intact. Fund of Knowledge was assessed and was intact.  Cranial Nerves II - XII - II - Visual field intact OU. III, IV, VI - Extraocular movements intact. V - Facial sensation intact  bilaterally. VII - Facial movement intact bilaterally, however, mild right nasolabial fold flattening. VIII - Hearing & vestibular intact bilaterally. X - Palate elevates symmetrically. XI - Chin turning & shoulder shrug intact bilaterally. XII - Tongue protrusion intact.  Motor Strength - The patient's strength was normal in all extremities and pronator drift was absent.  Bulk was normal and fasciculations were absent.   Motor Tone - Muscle tone was assessed at the neck and appendages and was normal.  Reflexes - The patient's reflexes were 1+ in all extremities and he had no pathological reflexes.  Sensory - Light touch, temperature/pinprick were assessed and were symmetrical.    Coordination - The patient had normal movements in the hands and feet with no ataxia or dysmetria.  Tremor was absent.  Gait and Station - deferred as pt is eating lunch.  ASSESSMENT/PLAN Mr. Ervie Mccard is a 81 y.o. male with history of Parkinson's disease, mitral valve replacement and dyslipidemia, who developed acute onset of right facial droop and speech output difficulty. He did not receive IV t-PA due to rapidly resolving deficits.   Stroke:  left frontal infarct, cortical location, felt to be embolic  secondary to unknown source.  Resultant  Mild right facial weakness  MRI  L frontal cortical small and subtle infarct  MRA  Unremarkable   Carotid Doppler  unremarkable   2D Echo unremarkable as noted above  TEE - pt refused  LE venous doppler pt refused If carotid doppler without significant plaque, will check TEE to look for embolic source. Arranged with [facility] for tomorrow.  If positive for PFO (patent foramen ovale), check bilateral lower extremity venous dopplers to rule out DVT as possible source of stroke. (I have made patient NPO after midnight tonight).  If TEE negative, a [facility] electrophysiologist will consult and consider placement  of an implantable loop recorder to evaluate for atrial fibrillation as etiology of stroke. This has been explained to patient/family by Dr. Roda Shutters and they are agreeable.   LDL 93  HgbA1c 6.0  Lovenox 40 mg sq daily for VTE prophylaxis Diet - low sodium heart healthy  aspirin 81 mg orally every day prior to admission, now on aspirin 325 mg orally every day  Ongoing aggressive stroke risk factor management  Therapy recommendations:  No follow-up therapy  Disposition:  Discharged today  Hyperlipidemia  Home meds:  lipitor 20, resumed in hospital  LDL 93, goal < 70  Continue statin at discharge  Other Stroke Risk Factors  Advanced age  Family hx stroke (mother, hemorrhage)  Other Active Problems  Parkinson's disease - following up with [facility] - on sinement, azilect and neupro  Follow-up Dr. Roda Shutters for stroke in 2 months.  Hospital day #   Shannan Harper, [facility] See Amion for Pager information 03/07/2015 3:58 PM   I, the attending vascular neurologist, have personally obtained a history, examined the patient, evaluated laboratory data, individually viewed imaging studies and agree with radiology interpretations. I also obtained additional history from pt's wife at bedside. Together with the NP/PA, we formulated the assessment and plan of care which reflects our mutual decision.  I have made any additions or clarifications directly to the above note and agree with the findings and plan as currently documented.   81 yo M with PD, MVR in 2008, HLD presented with right facial droop and expressive aphasia, rapid improving. Stroke work up so far negative except MRI showed left frontal cortical small and subtle infarct. Concerning for initial large clot breaking off with symptoms improving. With hx of MVR, we recommend TEE and loop recorder and DVT screening, However, pt refused and signed AMA for discharge. He will sontinue follow up with [facility] for PD.   Neurology  will sign off. Please call with questions. Pt will follow up with Dr. Roda Shutters at [facility] in about 2 months. Thanks for the consult.   Marvel Plan, MD PhD Stroke Neurology 03/08/2015 11:08 PM      To contact Stroke Continuity provider, please refer to [email or web address]. After hours, contact General Neurology

## 2015-03-13 NOTE — CV Procedure (Signed)
EP Procedure Note  Procedure: ILR insertion  Preoperative diagnosis: Cryptogenic stroke  Postoperative diagnosis: Cryptogenic stroke  Description of the procedure: After informed consent was obtained, the patient was prepped and draped in the usual manner. 20 cc of lidocaine was infiltrated into the left pectoral region. A 1 cm stab incision was carried out. The [employer] implantable loop recorder, serial [id number] was placed under the skin. The R-wave measures 0.4 mV. Benzoin and Steri-Strips her pain on the skin. A dressing was placed. The patient was recovered in the usual manner.  Complications: There were no immediate procedure complications  Conclusion: Successful insertion of an implantable loop recorder in a patient with cryptogenic stroke.  Lewayne BuntingGregg Cortney Mckinney, M.D.

## 2015-03-13 NOTE — CV Procedure (Signed)
Procedure: TEE  Indication: CVA, MV abnormality noted on TTE  Sedation: Versed 4 mg IV, Fentanyl 50 mcg IV  Findings: Please see echo section for full report.  Normal left ventricular size and systolic function, EF 55%.  Normal wall motion and wall thickness.  There was mild left atrial enlargement.  The LA appendage has been oversewn with a very small residual connection to the LA.  There was no thrombus in the appendage.  The right ventricle was mildly dilated with normal systolic function.  There was mild TR.  Peak RV-RA gradient 43 mmHg.  The aortic valve was trileaflet with no AS, trivial AI.  Trivial PI.  The patient is s/p mitral valve repair with annuloplasty ring.  There does appear to be a loose suture posteriorly with a small area of annuloplasty ring dehiscence.  There is moderate mitral regurgitation with one perivalvular jet posteriorly and a small central jet.  There is no significant stenosis.  No vegetation noted.  No PFO/ASD (negative bubble study).  Normal caliber aorta with no significant plaque.    Impression: No endocarditis, no definite source of embolus.  S/p MV repair with annuloplasty ring.  Loose suture noted posteriorly with small area of sewing ring dehiscence.  MR is moderate.  Will need to be followed closely.   [name] AnconaDalton Mazin [name] 03/13/2015 9:46 AM

## 2015-03-13 NOTE — Consult Note (Addendum)
ELECTROPHYSIOLOGY CONSULT NOTE  Patient ID: [name] [name] MRN: [id number], DOB/AGE: August 23, 1932   Admit date: 03/13/2015 Date of Consult: 03/13/2015  Primary Physician: Ezequiel Kayser, MD Primary Cardiologist: Shirlee Latch Reason for Consultation: Cryptogenic stroke; recommendations regarding Implantable Loop Recorder  History of Present Illness Bodey Frizell was admitted on 03/05/15 with right facial droop and expressive aphasia.  Imaging demonstrated left frontal infarct.  He underwent workup for stroke including echocardiogram and carotid dopplers.  The patient was monitored on telemetry which  demonstrated sinus rhythm with no arrhythmias.  TEE today demonstrated no cardiac source for embolism.  Echocardiogram demonstrated EF 55-60%, mild AR, moderate MR, LA 47.   Prior to admission, the patient denies chest pain, shortness of breath, dizziness, palpitations, or syncope.    EP has been asked to evaluate for placement of an implantable loop recorder to monitor for atrial fibrillation.  ROS is negative except as outlined above.    Past Medical History  Diagnosis Date  . Parkinson's disease   . S/P mitral valve replacement     mitral regurg, endocarditis  . Dyslipidemia      Surgical History:  Past Surgical History  Procedure Laterality Date  . Mitral valve annuloplasty  04/10/2007     26mm Edwards ring; r/t h/o MR and flail mitral leaflets due to endocarditis: MRI safe  . Cataract extraction  2009, 2012    right 2009, left 2012  . Transthoracic echocardiogram  08/15/2012    EF=>55%; normal LV systolic function; RV mildly dilated & systolic function mildly reduced; RA mod dilated; mild -mod MR & mildy increased gradients; mild TR; AV mildly sclerotic with mild-mod regurg     Prescriptions prior to admission  Medication Sig Dispense Refill Last Dose  . acetaminophen (TYLENOL) 325 MG tablet Take 325 mg by mouth every 6 (six) hours as needed for headache.    Past Month at Unknown time  .  amantadine (SYMMETREL) 100 MG capsule Take 100 mg by mouth 2 (two) times daily.   03/12/2015 at 1800  . amoxicillin (AMOXIL) 500 MG capsule Take 500 mg by mouth See admin instructions. Pt only takes 500mg  prior to dental appt     . aspirin 325 MG tablet Take 1 tablet (325 mg total) by mouth daily. 30 tablet 0 03/12/2015 at 1800  . atorvastatin (LIPITOR) 40 MG tablet Take 1 tablet (40 mg total) by mouth daily. (Patient taking differently: Take 20 mg by mouth daily. ) 30 tablet 0 03/12/2015 at 2100  . cholecalciferol (VITAMIN D) 1000 UNITS tablet Take 1,000 Units by mouth daily.   03/12/2015 at 0700  . Docusate Calcium (STOOL SOFTENER PO) Take by mouth as needed.   Past Month at Unknown time  . escitalopram (LEXAPRO) 10 MG tablet Take 10 mg by mouth daily.   Past Week at Unknown time  . ibandronate (BONIVA) 150 MG tablet Take 1 tablet by mouth every 30 (thirty) days.   Past Week at Unknown time  . Multiple Vitamins-Minerals (MULTIVITAMIN PO) Take 1 tablet by mouth daily.    03/12/2015 at 0700  . rasagiline (AZILECT) 1 MG TABS tablet Take 1 mg by mouth every morning. .   03/12/2015 at 0700  . rotigotine (NEUPRO) 4 MG/24HR Place 1 patch onto the skin daily.   03/13/2015 at Unknown time  . RYTARY 48.75-195 MG CPCR Take 9 capsules by mouth See admin instructions. 3 caps in the morning with Azelect, 3 caps at 1pm with Amantadine, 2 caps at 6pm with Amantadine and  1 cap at bedtime.   03/13/2015 at 0400  . vitamin B-12 (CYANOCOBALAMIN) 1000 MCG tablet Take 1,000 mcg by mouth daily.   03/12/2015 at 0700  . vitamin E 400 UNIT capsule Take 400 Units by mouth daily.   03/12/2015 at 0700    Inpatient Medications:    Allergies:  Allergies  Allergen Reactions  . Ace Inhibitors Cough  . Demerol [Meperidine] Other (See Comments)    Parkinsons disease    History   Social History  . Marital Status: Married    Spouse Name: N/A  . Number of Children: 3  . Years of Education: law school   Occupational History  .  retired    Social History Main Topics  . Smoking status: Never Smoker   . Smokeless tobacco: Never Used  . Alcohol Use: Yes     Comment: 1 can of beer or 1 wine every day  . Drug Use: No  . Sexual Activity: Not on file   Other Topics Concern  . Not on file   Social History Narrative     Family History  Problem Relation Age of Onset  . Stroke Mother     cerebral hemorrahge  . Heart attack Mother   . Hypertension Brother   . Valvular heart disease Brother     also MI  . Prostate cancer Father      Physical Exam: Filed Vitals:   03/13/15 0950 03/13/15 1000 03/13/15 1010 03/13/15 1020  BP:  104/72 103/66 101/68  Pulse: 66 71 66 67  Temp:      TempSrc:      Resp: 17 20 17 23   SpO2: 94% 94% 94% 93%    GEN- The patient is well appearing, alert and oriented x 3 today.   Head- normocephalic, atraumatic Eyes-  Sclera clear, conjunctiva pink Ears- hearing intact Oropharynx- clear Neck- supple, Lungs- Clear to ausculation bilaterally, normal work of breathing Heart- Regular rate and rhythm, 2/6 SEM GI- soft, NT, ND, + BS Extremities- no clubbing, cyanosis, or edema MS- no significant deformity or atrophy Skin- no rash or lesion Psych- euthymic mood, full affect   Labs:   Lab Results  Component Value Date   WBC 6.7 03/05/2015   HGB 16.7 03/05/2015   HCT 49.0 03/05/2015   MCV 96.3 03/05/2015   PLT 159 03/05/2015   No results for input(s): NA, K, CL, CO2, BUN, CREATININE, CALCIUM, PROT, BILITOT, ALKPHOS, ALT, AST, GLUCOSE in the last 168 hours.  Invalid input(s): LABALBU   Radiology/Studies: Dg Chest 2 View 03/05/2015   CLINICAL DATA:  Facial droop.  CVA  EXAM: CHEST  2 VIEW  COMPARISON:  05/11/2007  FINDINGS: Chronic cardiomegaly. The patient is status post mitral valve replacement. Aortic tortuosity is prominent but stable from previous. Mild scarring again noted at the bases. There is no edema, consolidation, effusion, or pneumothorax.  IMPRESSION: Stable exam.   No active cardiopulmonary disease.   Electronically Signed   By: Marnee SpringJonathon  Watts M.D.   On: 03/05/2015 23:58   Ct Head (brain) Wo Contrast 03/05/2015   CLINICAL DATA:  Right-sided facial droop and slurred speech.  EXAM: CT HEAD WITHOUT CONTRAST  TECHNIQUE: Contiguous axial images were obtained from the base of the skull through the vertex without intravenous contrast.  COMPARISON:  None.  FINDINGS: Age related cerebral atrophy, ventriculomegaly and periventricular white matter disease. There is a remote appearing lacunar type basal ganglia infarct on the right side. No CT findings for acute hemispheric infarction or intracranial  hemorrhage. No mass lesions. No extra-axial fluid collections. The brainstem and cerebellum are grossly normal.  No significant bony findings. The paranasal sinuses and mastoid air cells are clear. The globes are intact.  IMPRESSION: Age related cerebral atrophy, ventriculomegaly and periventricular white matter disease.  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  No findings for acute hemispheric infarction or intracranial hemorrhage.   Electronically Signed   By: Rudie Meyer M.D.   On: 03/05/2015 21:02   Mr Brain Wo Contrast 03/06/2015   CLINICAL DATA:  Initial evaluation for acute speech difficulty, history of Parkinson's disease.  EXAM: MRI HEAD WITHOUT CONTRAST  MRA HEAD WITHOUT CONTRAST  TECHNIQUE: Multiplanar, multiecho pulse sequences of the brain and surrounding structures were obtained without intravenous contrast. Angiographic images of the head were obtained using MRA technique without contrast.  COMPARISON:  Prior CT from 03/05/2015.  FINDINGS: MRI HEAD FINDINGS  Diffuse prominence of the CSF containing spaces is compatible with generalized cerebral atrophy. No significant white matter changes present for patient age.  There are small remote infarcts involving the right frontal cortex (series 8, image 21). As well as the posterior left frontal  cortex (series 8, image 20).  On DWI sequence, there is a single subtle 5 mm focus of increased signal intensity involving the posterior left frontal cortex in the region of the precentral gyrus (series 3, image 33 are on axial sequence, series 5, image 15 on coronal sequence). No definite hyperintense signal intensity seen on ADC maps is suggestive this T2 shine through. No definite signal abnormality seen within this region on corresponding T2 or FLAIR weighted sequence. Finding may reflect a small subtle cortical infarct versus artifact.  No other abnormal foci of restricted diffusion to suggest acute intracranial infarct identified. Gray-white matter differentiation otherwise maintained. No acute intracranial hemorrhage. Single small subcentimeter chronic micro hemorrhage noted within the left occipital lobe.  No mass lesion or midline shift. No hydrocephalus. No extra-axial fluid collection.  Craniocervical junction within normal limits. Pituitary gland normal. No acute abnormality seen about the orbits.  There is scattered opacity within the inferior right frontal sinus. Paranasal sinuses are otherwise clear. No mastoid effusion. Inner ear structures are normal.  Bone marrow signal intensity within normal limits. Scattered degenerative changes noted within the upper cervical spine.  MRA HEAD FINDINGS  ANTERIOR CIRCULATION:  Visualized portions of the distal cervical segments of the internal carotid arteries are widely patent with antegrade flow. The petrous, cavernous, and supra clinoid segments are widely patent. A1 segments, anterior communicating artery, and anterior cerebral arteries well opacified. M1 segments widely patent without stenosis or occlusion. MCA bifurcations normal. MCA branches symmetric and normal in appearance bilaterally.  POSTERIOR CIRCULATION:  Both vertebral arteries well opacified to the vertebrobasilar junction without stenosis or occlusion. Posterior inferior cerebral arteries not  well evaluated on this exam. Basilar artery widely patent. Superior cerebellar and posterior cerebral arteries well opacified bilaterally without stenosis or occlusion. Anterior inferior cerebral arteries are patent proximally.  No aneurysm or vascular malformation.  IMPRESSION: MRI HEAD IMPRESSION:  1. Subtly increased 5 mm focus of high signal intensity within the posterior left frontal lobe on DWI sequence as above. Finding is favored to be artifactual in nature, although possible small acute ischemic infarct is not entirely excluded. Correlation with physical exam and history for possible acute ischemia in this territory is recommended. 2. No other acute intracranial infarct or other abnormality identified. 3. Small remote anterior right frontal and posterior left  frontal cortical infarcts as above. 4. Generalized cerebral atrophy.  MRA HEAD IMPRESSION:  Unremarkable MRA of the intracranial circulation without proximal branch occlusion or hemodynamically significant stenosis.   Electronically Signed   By: Rise Mu M.D.   On: 03/06/2015 05:35    12-lead ECG sinus rhythm, PVC, PR 220 All prior EKG's in EPIC reviewed with no documented atrial fibrillation  Assessment and Plan:  1. Cryptogenic stroke The patient presents with cryptogenic stroke.  The patient has a TEE planned for this AM.  I spoke at length with the patient about monitoring for afib with an implantable loop recorder.  Risks, benefits, and alteratives to implantable loop recorder were discussed with the patient today.   At this time, the patient is very clear in their decision to proceed with implantable loop recorder.   Wound care was reviewed with the patient (keep incision clean and dry for 3 days).  Wound check scheduled for 03-18-15 at 8AM  Please call with questions.   Gypsy Balsam, NP 03/13/2015 10:23 AM   EP Attending  Patient seen and examined. Agree with the history, exam, assessment and plan as noted above  with minimal modifications. If TEE without explanation for stroke, will plan to proceed with ILR.  Leonia Reeves.D.

## 2015-03-13 NOTE — Discharge Instructions (Signed)
Transesophageal Echocardiogram °Transesophageal echocardiography (TEE) is a special type of test that produces images of the heart by using sound waves (echocardiogram). This type of echocardiography can obtain better images of the heart than standard echocardiography. TEE is done by passing a flexible tube down the esophagus. The heart is located in front of the esophagus. Because the heart and esophagus are close to one another, your health care provider can take very clear, detailed pictures of the heart via ultrasound waves. °TEE may be done: °· If your health care provider needs more information based on standard echocardiography findings. °· If you had a stroke. This might have happened because a clot formed in your heart. TEE can visualize different areas of the heart and check for clots. °· To check valve anatomy and function. °· To check for infection on the inside of your heart (endocarditis). °· To evaluate the dividing wall (septum) of the heart and presence of a hole that did not close after birth (patent foramen ovale or atrial septal defect). °· To help diagnose a tear in the wall of the aorta (aortic dissection). °· During cardiac valve surgery. This allows the surgeon to assess the valve repair before closing the chest. °· During a variety of other cardiac procedures to guide positioning of catheters. °· Sometimes before a cardioversion, which is a shock to convert heart rhythm back to normal. °LET YOUR HEALTH CARE PROVIDER KNOW ABOUT:  °· Any allergies you have. °· All medicines you are taking, including vitamins, herbs, eye drops, creams, and over-the-counter medicines. °· Previous problems you or members of your family have had with the use of anesthetics. °· Any blood disorders you have. °· Previous surgeries you have had. °· Medical conditions you have. °· Swallowing difficulties. °· An esophageal obstruction. °RISKS AND COMPLICATIONS  °Generally, TEE is a safe procedure. However, as with any  procedure, complications can occur. Possible complications include an esophageal tear (rupture). °BEFORE THE PROCEDURE  °· Do not eat or drink for 6 hours before the procedure or as directed by your health care provider. °· Arrange for someone to drive you home after the procedure. Do not drive yourself home. During the procedure, you will be given medicines that can continue to make you feel drowsy and can impair your reflexes. °· An IV access tube will be started in the arm. °PROCEDURE  °· A medicine to help you relax (sedative) will be given through the IV access tube. °· A medicine may be sprayed or gargled to numb the back of the throat. °· Your blood pressure, heart rate, and breathing (vital signs) will be monitored during the procedure. °· The TEE probe is a long, flexible tube. The tip of the probe is placed into the back of the mouth, and you will be asked to swallow. This helps to pass the tip of the probe into the esophagus. Once the tip of the probe is in the correct area, your health care provider can take pictures of the heart. °· TEE is usually not a painful procedure. You may feel the probe press against the back of the throat. The probe does not enter the trachea and does not affect your breathing. °AFTER THE PROCEDURE  °· You will be in bed, resting, until you have fully returned to consciousness. °· When you first awaken, your throat may feel slightly sore and will probably still feel numb. This will improve slowly over time. °· You will not be allowed to eat or drink until it   is clear that the numbness has improved. °· Once you have been able to drink, urinate, and sit on the edge of the bed without feeling sick to your stomach (nausea) or dizzy, you may be cleared to go home. °· You should have a friend or family member with you for the next 24 hours after your procedure. °Document Released: 01/29/2003 Document Revised: 11/13/2013 Document Reviewed: 05/10/2013 °[employer]® Patient Information  ©2015 [employer]. This information is not intended to replace advice given to you by your health care provider. Make sure you discuss any questions you have with your health care provider. ° °

## 2015-03-13 NOTE — Interval H&P Note (Signed)
History and Physical Interval Note:  03/13/2015 9:16 AM  [name] [name]  has presented today for surgery, with the diagnosis of STROKE  The various methods of treatment have been discussed with the patient and family. After consideration of risks, benefits and other options for treatment, the patient has consented to  Procedure(s): TRANSESOPHAGEAL ECHOCARDIOGRAM (TEE) (N/A) as a surgical intervention .  The patient's history has been reviewed, patient examined, no change in status, stable for surgery.  I have reviewed the patient's chart and labs.  Questions were answered to the patient's satisfaction.     [name] [employer]

## 2015-03-13 NOTE — Progress Notes (Signed)
  Echocardiogram Echocardiogram Transesophageal has been performed.  [name], [name] [name] 03/13/2015, 12:40 PM

## 2015-03-14 ENCOUNTER — Encounter (HOSPITAL_COMMUNITY): Payer: Self-pay | Admitting: Cardiology

## 2015-03-17 DIAGNOSIS — I340 Nonrheumatic mitral (valve) insufficiency: Secondary | ICD-10-CM | POA: Insufficient documentation

## 2015-03-17 NOTE — Progress Notes (Signed)
Cardiology Office Note   Date:  03/18/2015   ID:  [name] [name], DOB 10-22-1932, MRN [id number]  PCP:  Ezequiel Kayser, MD  Cardiologist:  Dr. Marca Ancona   Electrophysiologist:  Dr. Lewayne Bunting   Chief Complaint  Patient presents with  . Mitral Regurgitation     History of Present Illness: [name] [name] is a 81 y.o. 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    Previously followed by Dr. Kirtland Bouchard. [place] [facility].  Admitted 4/13-4/15 with R facial droop and expressive aphasia thought to be 2/2 L frontal cortical small and subtle CVA (cryptogenic stroke).  TEE and ILR implant was to be performed but the patient left AMA.  TEE was needed due to question of small mass on anterior MV leaflet.  He did agree to return to the hospital 1 week later for TEE and ILR.  TEE was performed by Dr. Marca Ancona 4/21.  TEE demonstrated no endocarditis and no definite source of embolus, s/p MV repair; loose suture noted posteriorly with small area of sewing ring dehiscence, moderate MR.  Dr. Ladona Ridgel implanted ILR on 4/21.  He requested FU with Dr. Marca Ancona.  He returns for FU.  Since discharge, he has been doing well. His aphasia has cleared. Right facial droop is also improved. Overall, he denies significant dyspnea. He works with physical therapy for his Parkinson's. He will sometimes get out of breath with this or with walking his dog. Overall, he describes NYHA 2-2b symptoms. He denies orthopnea, PND or edema. He denies chest pain, seen to be, palpitations. He denies coughing or wheezing.   Studies/Reports Reviewed Today:  Echo 03/06/15 - EF 55% to 60%. - Aortic valve: There was mild regurgitation. - Mitral valve: s/p MV annuloplasty. Echodenisity on atrial side of anterior mitrla leaflet probably reflects some prolapse, cannot completely exclude small mass.  There are several jets of MR, one posterior, one central, one anteiror.  Pkea nad mean  gradients through the valver are 9 and 3 mm respectively This is increased from gradients seen in 2014. There was moderate regurgitation. Valve area by continuity equation (using LVOT flow): 1.27 cm^2. - Left atrium: The atrium was severely dilated. - Right ventricle: The cavity size was moderately dilated. Wall thickness was normal. - Right atrium: The atrium was mildly dilated. - Pericardium, extracardiac: A trivial pericardial effusion was identified.  TEE 03/13/15 - EF 55%. Wall motion was normal  - Aortic valve: There was no stenosis. There was trivial regurgitation. - Aorta: Normal caliber aorta with no significant plaque. - Mitral valve: The patient is s/p mitral valve repair with annuloplasty ring. There does appear to be a loose suture posteriorly with a small area of annuloplasty ring dehiscence. There is moderate mitral regurgitation with one perivalvular jet posteriorly and a small central jet. There is no significant stenosis. No vegetation noted. - Left atrium: There was mild left atrial enlargement. The LA appendage has been oversewn with a very small residual connection to the LA. There was no thrombus in the appendage. - Right ventricle: The cavity size was mildly dilated. Systolic function was normal. - Right atrium: No evidence of thrombus in the atrial cavity or appendage. - Atrial septum: No defect or patent foramen ovale was identified. Echo contrast study showed no right-to-left atrial level shunt, at baseline or with provocation.  Carotid US 02/2015 1-39% bilat ICA  LHC 03/2007 No significant CAD  Past Medical History  Diagnosis Date  . Parkinson's disease   . S/P mitral valve replacement     mitral regurg, endocarditis  . Dyslipidemia     Past Surgical History  Procedure Laterality Date  . Mitral valve annuloplasty  04/10/2007     26mm Edwards ring; r/t h/o MR and flail mitral leaflets due to endocarditis: MRI safe  . Cataract extraction  2009, 2012    right  2009, left 2012  . Transthoracic echocardiogram  08/15/2012    EF=>55%; normal LV systolic function; RV mildly dilated & systolic function mildly reduced; RA mod dilated; mild -mod MR & mildy increased gradients; mild TR; AV mildly sclerotic with mild-mod regurg  . Loop recorder implant Left 03/13/2015    Procedure: LOOP RECORDER IMPLANT;  Surgeon: Marinus MawGregg W Taylor, MD;  [facility];  Service: Cardiovascular;  Laterality: Left;  . Tee without cardioversion N/A 03/13/2015    Procedure: TRANSESOPHAGEAL ECHOCARDIOGRAM (TEE);  Surgeon: Laurey Moralealton S McLean, MD;  [facility];  Service: Cardiovascular;  Laterality: N/A;     Current Outpatient Prescriptions  Medication Sig Dispense Refill  . acetaminophen (TYLENOL) 325 MG tablet Take 325 mg by mouth every 6 (six) hours as needed for headache.     Marland Kitchen. amantadine (SYMMETREL) 100 MG capsule Take 100 mg by mouth 2 (two) times daily.    Marland Kitchen. amoxicillin (AMOXIL) 500 MG capsule Take 500 mg by mouth See admin instructions. Pt only takes 500mg  prior to dental appt    . aspirin 325 MG tablet Take 1 tablet (325 mg total) by mouth daily. 30 tablet 0  . atorvastatin (LIPITOR) 20 MG tablet Take 20 mg by mouth daily at 6 PM.     . cholecalciferol (VITAMIN D) 1000 UNITS tablet Take 1,000 Units by mouth daily.    Tery Sanfilippo. Docusate Calcium (STOOL SOFTENER PO) Take by mouth as needed.    Marland Kitchen. escitalopram (LEXAPRO) 10 MG tablet Take 10 mg by mouth daily.    Marland Kitchen. ibandronate (BONIVA) 150 MG tablet Take 1 tablet by mouth every 30 (thirty) days.    . Multiple Vitamins-Minerals (MULTIVITAMIN PO) Take 1 tablet by mouth daily.     . rasagiline (AZILECT) 1 MG TABS tablet Take 1 mg by mouth every morning. .    . rotigotine (NEUPRO) 4 MG/24HR Place 1 patch onto the skin daily.    Marland Kitchen. RYTARY 48.75-195 MG CPCR Take 9 capsules by mouth See admin instructions. 3 caps in the morning with Azelect, 3 caps at 1pm with Amantadine, 2 caps at 6pm with Amantadine and 1 cap at bedtime.    .  vitamin B-12 (CYANOCOBALAMIN) 1000 MCG tablet Take 1,000 mcg by mouth daily.    . vitamin E 400 UNIT capsule Take 400 Units by mouth daily.     No current facility-administered medications for this visit.    Allergies:   Ace inhibitors and Demerol    Social History:  The patient  reports that he has never smoked. He has never used smokeless tobacco. He reports that he drinks alcohol. He reports that he does not use illicit drugs.   Family History:  The patient's family history includes Heart attack in his mother; Hypertension in his brother; Prostate cancer in his father; Stroke in his mother; Valvular heart disease in his brother.    ROS:   Please see the history of present illness.   Review of Systems  All other systems reviewed and are negative.    PHYSICAL EXAM: VS:  BP 120/72 mmHg  Pulse 74  Ht  (1.778 m)  Wt 183 lb (83.008 kg)  BMI 26.26 kg/m2  SpO2 92%    Wt Readings from Last 3 Encounters:  03/18/15 183 lb (83.008 kg)  03/06/15 170 lb 3.1 oz (77.2 kg)  08/06/13 188 lb 8 oz (85.503 kg)     GEN: Well nourished, well developed, in no acute distress HEENT: normal Neck: no JVD, no carotid bruits, no masses Cardiac:  Normal S1/S2, RRR; 2-3/6 holosystolic murmur ,  no rubs or gallops, no edema  Chest:  ILR site well healed without erythema or d/c, mod amount of ecchymosis noted Respiratory:  clear to auscultation bilaterally, no wheezing, rhonchi or rales. GI: soft, nontender, nondistended, + BS MS: no deformity or atrophy Skin: warm and dry  Neuro:  Resting tremor noted Psych: Normal affect   EKG:  EKG is ordered today.  It demonstrates:   NSR, HR 74, normal axis, PVC   Recent Labs: 03/05/2015: ALT 9; BUN 31*; Creatinine 1.10; Hemoglobin 16.7; Platelets 159; Potassium 4.3; Sodium 138    Lipid Panel    Component Value Date/Time   CHOL 160 03/07/2015 0623   TRIG 94 03/07/2015 0623   HDL 48 03/07/2015 0623   CHOLHDL 3.3 03/07/2015 0623   VLDL 19  03/07/2015 0623   LDLCALC 93 03/07/2015 0623      ASSESSMENT AND PLAN:  Mitral regurgitation S/P mitral valve repair As noted, recent TEE demonstrates mitral valve repair with a loose suture posteriorly and a small area of annuloplasty ring dehiscence, moderate MR. Patient denies significant shortness of breath. He does not look volume overloaded. We discussed the importance of following this closely. The patient already observes SBE prophylaxis. I will arrange 6 month follow-up echo.   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 No arrhythmias noted. Follow-up with neurology as planned.  Dyslipidemia Continue statin.  Parkinson's disease  Follow-up with physical therapy and neurology as planned.  Current medicines are reviewed at length with the patient today.  Concerns regarding medicines are as outlined above.  The following changes have been made:    None   Labs/ tests ordered today include:  Orders Placed This Encounter  Procedures  . EKG 12-Lead  . 2D Echocardiogram without contrast    Disposition:   FU with Dr. Shirlee Latch 3 months   Signed, Tereso Newcomer, PA-C, MHS 03/18/2015 8:40 AM    [facility] [address] [place], [place], [place]  [address] Phone: [phone]; Fax: [phone]

## 2015-03-18 ENCOUNTER — Ambulatory Visit (INDEPENDENT_AMBULATORY_CARE_PROVIDER_SITE_OTHER): Payer: Medicare Other | Admitting: Physician Assistant

## 2015-03-18 ENCOUNTER — Telehealth: Payer: Self-pay | Admitting: Cardiology

## 2015-03-18 ENCOUNTER — Ambulatory Visit (INDEPENDENT_AMBULATORY_CARE_PROVIDER_SITE_OTHER): Payer: Medicare Other | Admitting: *Deleted

## 2015-03-18 ENCOUNTER — Encounter: Payer: Self-pay | Admitting: Physician Assistant

## 2015-03-18 VITALS — BP 120/72 | HR 74 | Ht 70.0 in | Wt 183.0 lb

## 2015-03-18 DIAGNOSIS — I340 Nonrheumatic mitral (valve) insufficiency: Secondary | ICD-10-CM | POA: Diagnosis not present

## 2015-03-18 DIAGNOSIS — I6310 Cerebral infarction due to embolism of unspecified precerebral artery: Secondary | ICD-10-CM

## 2015-03-18 DIAGNOSIS — I639 Cerebral infarction, unspecified: Secondary | ICD-10-CM

## 2015-03-18 DIAGNOSIS — E785 Hyperlipidemia, unspecified: Secondary | ICD-10-CM

## 2015-03-18 DIAGNOSIS — Z9889 Other specified postprocedural states: Secondary | ICD-10-CM | POA: Diagnosis not present

## 2015-03-18 DIAGNOSIS — G20 Parkinson's disease: Secondary | ICD-10-CM

## 2015-03-18 LAB — MDC_IDC_ENUM_SESS_TYPE_INCLINIC
MDC IDC SESS DTM: 20160426083715
Zone Setting Detection Interval: 2000 ms
Zone Setting Detection Interval: 3000 ms
Zone Setting Detection Interval: 400 ms

## 2015-03-18 NOTE — Progress Notes (Signed)
ILR wound check in clinic. Wound well healed without redness, swelling or drainage. Normal device function.  ILR implanted for cryptogenic stroke. No episodes recorded.  See Pace[facility] report for details.  Monthly [facility] summary reports.  Acie FredricksonEmma Eusebio Blazejewski, RN

## 2015-03-18 NOTE — Patient Instructions (Signed)
Medication Instructions:  Your physician recommends that you continue on your current medications as directed. Please refer to the Current Medication list given to you today.  Labwork: NONE  Testing/Procedures: Your physician has requested that you have an echocardiogram in 6 months. Echocardiography is a painless test that uses sound waves to create images of your heart. It provides your doctor with information about the size and shape of your heart and how well your heart's chambers and valves are working. This procedure takes approximately one hour. There are no restrictions for this procedure.  Follow-Up: Your physician wants you to follow-up in: 3 months with Dr. Shirlee LatchMcLean. You will receive a reminder letter in the mail two months in advance. If you don't receive a letter, please call our office to schedule the follow-up appointment.   Any Other Special Instructions Will Be Listed Below (If Applicable).

## 2015-03-24 NOTE — Telephone Encounter (Signed)
You can schedule him to see  Dr Shirlee LatchMcLean Aug 3,2016 at 1PM. Thanks.

## 2015-03-25 NOTE — Telephone Encounter (Signed)
Follow up scheduled for 06/25/2015 @ 1:00, pt notified

## 2015-03-28 ENCOUNTER — Encounter: Payer: Self-pay | Admitting: Internal Medicine

## 2015-04-11 ENCOUNTER — Encounter: Payer: Self-pay | Admitting: Internal Medicine

## 2015-04-11 ENCOUNTER — Ambulatory Visit (INDEPENDENT_AMBULATORY_CARE_PROVIDER_SITE_OTHER): Payer: Medicare Other | Admitting: *Deleted

## 2015-04-11 DIAGNOSIS — I639 Cerebral infarction, unspecified: Secondary | ICD-10-CM | POA: Diagnosis not present

## 2015-04-15 NOTE — Progress Notes (Signed)
Loop recorder 

## 2015-04-23 ENCOUNTER — Encounter: Payer: Self-pay | Admitting: Cardiology

## 2015-04-24 LAB — CUP PACEART REMOTE DEVICE CHECK: Date Time Interrogation Session: 20160502040500

## 2015-05-12 ENCOUNTER — Ambulatory Visit (INDEPENDENT_AMBULATORY_CARE_PROVIDER_SITE_OTHER): Payer: Medicare Other | Admitting: *Deleted

## 2015-05-12 ENCOUNTER — Encounter: Payer: Self-pay | Admitting: Internal Medicine

## 2015-05-12 DIAGNOSIS — I639 Cerebral infarction, unspecified: Secondary | ICD-10-CM

## 2015-05-13 NOTE — Progress Notes (Signed)
Loop recorder 

## 2015-05-19 ENCOUNTER — Other Ambulatory Visit: Payer: Self-pay

## 2015-05-20 LAB — CUP PACEART REMOTE DEVICE CHECK: Date Time Interrogation Session: 20160628091003

## 2015-06-02 ENCOUNTER — Ambulatory Visit (INDEPENDENT_AMBULATORY_CARE_PROVIDER_SITE_OTHER): Payer: Medicare Other | Admitting: Neurology

## 2015-06-02 ENCOUNTER — Ambulatory Visit: Payer: Medicare Other | Admitting: Neurology

## 2015-06-02 ENCOUNTER — Encounter: Payer: Self-pay | Admitting: Neurology

## 2015-06-02 VITALS — BP 104/64 | HR 64 | Ht 66.0 in | Wt 178.2 lb

## 2015-06-02 DIAGNOSIS — G20 Parkinson's disease: Secondary | ICD-10-CM | POA: Diagnosis not present

## 2015-06-02 DIAGNOSIS — I63412 Cerebral infarction due to embolism of left middle cerebral artery: Secondary | ICD-10-CM | POA: Insufficient documentation

## 2015-06-02 DIAGNOSIS — E785 Hyperlipidemia, unspecified: Secondary | ICD-10-CM | POA: Insufficient documentation

## 2015-06-02 NOTE — Progress Notes (Signed)
STROKE NEUROLOGY FOLLOW UP NOTE  NAME: Jamison Soward DOB: [birth year]  REASON FOR VISIT: stroke follow up HISTORY FROM: chart and wife  Today we had the pleasure of seeing Gerry Blanchfield in follow-up at our Neurology Clinic. Pt was accompanied by wife.   History Summary Mr. Mateus Rewerts is a 81 y.o. male with history of Parkinson's disease followed with [facility], mitral valve replacement in 2008 and dyslipidemia, who developed acute onset of right facial droop and expressive aphasia. He did not receive IV t-PA due to rapidly resolving deficits. Stroke work up so far negative except MRI showed left frontal cortical small and subtle infarct. Concerning for initial large clot breaking off with symptoms improving. TEE also negative for clot or PFO. Loop recorder placed. He was discharged with ASA and lipitor.  Interval History During the interval time, the patient has been doing well, no recurrent symptoms. For loop recorder monitoring, so far he has not been called by cardiology. However, on the chart it seems there was on suspicious episode of AF detect in May, will need to clarify with cardiology. BP 104/64 in clinic today. He continues to follow up with Dr. Daphene Calamity in [facility] for PD treatment.   REVIEW OF SYSTEMS: Full 14 system review of systems performed and notable only for those listed below and in HPI above, all others are negative:  Constitutional:   Cardiovascular:  Ear/Nose/Throat:   Skin:  Eyes:   Respiratory:   Gastroitestinal:   Genitourinary:  Hematology/Lymphatic:   Endocrine:  Musculoskeletal:   Allergy/Immunology:   Neurological:   Psychiatric: nervous/anxious Sleep:   The following represents the patient's updated allergies and side effects list: Allergies  Allergen Reactions  . Ace Inhibitors Cough  . Demerol [Meperidine] Other (See Comments)    Parkinsons disease    The neurologically relevant items on the patient's problem list were reviewed on today's  visit.  Neurologic Examination  A problem focused neurological exam (12 or more points of the single system neurologic examination, vital signs counts as 1 point, cranial nerves count for 8 points) was performed.  Blood pressure 104/64, pulse 64, height  (1.676 m), weight 178 lb 3.2 oz (80.831 kg).  General - Well nourished, well developed, in no apparent distress.  Ophthalmologic - fundi not visualized due to small pupils.  Cardiovascular - Regular rate and rhythm with no murmur.  Mental Status -  Level of arousal and orientation to time, place, and person were intact. Language including expression, naming, repetition, comprehension was assessed and found intact. Fund of Knowledge was assessed and was intact.  Cranial Nerves II - XII - II - Visual field intact OU. III, IV, VI - Extraocular movements intact. V - Facial sensation intact bilaterally. VII - Facial movement intact bilaterally, however, mild right nasolabial fold flattening. VIII - Hearing & vestibular intact bilaterally. X - Palate elevates symmetrically. XI - Chin turning & shoulder shrug intact bilaterally. XII - Tongue protrusion intact.  Motor Strength - The patient's strength was normal in all extremities and pronator drift was absent. Bulk was normal and fasciculations were absent.  Motor Tone - Muscle tone was assessed at the neck and appendages and was normal and no rigidity.  Reflexes - The patient's reflexes were 1+ in all extremities and he had no pathological reflexes.  Sensory - Light touch, temperature/pinprick were assessed and were symmetrical.   Coordination - The patient had normal movements in the hands and feet with no ataxia or dysmetria. Tremor was absent.  Gait and Station - mild stooped posturing, slow small stride.  Data reviewed: I personally reviewed the images and agree with the radiology interpretations.  Dg Chest 2 View 03/05/2015 Stable exam. No active  cardiopulmonary disease.   Ct Head (brain) Wo Contrast 03/05/2015 Age related cerebral atrophy, ventriculomegaly and periventricular white matter disease. 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 No findings for acute hemispheric infarction or intracranial hemorrhage.   MRI HEAD 03/06/2015 1. Subtly increased 5 mm focus of high signal intensity within the posterior left frontal lobe on DWI sequence as above. Finding is favored to be artifactual in nature, although possible small acute ischemic infarct is not entirely excluded. Correlation with physical exam and history for possible acute ischemia in this territory is recommended. 2. No other acute intracranial infarct or other abnormality identified. 3. Small remote anterior right frontal and posterior left frontal cortical infarcts as above. 4. Generalized cerebral atrophy.   MRA HEAD 03/06/2015 Unremarkable MRA of the intracranial circulation without proximal branch occlusion or hemodynamically significant stenosis.   CUS - Preliminary findings: Bilateral: 1-39% ICA stenosis. Vertebral artery flow is antegrade.   2D echo  03/06/2015 Study Conclusions - Left ventricle: The cavity size was normal. Wall thickness was normal. Systolic function was normal. The estimated ejection fraction was in the range of 55% to 60%. - Aortic valve: There was mild regurgitation. - Mitral valve: s/p MV annuloplasty. Echodenisity on atrial side of anterior mitrla leaflet probably reflects some prolapse, cannot completely exclude small mass There are several jets of MR, one posterior, one central, one anteiror. Pkea nad mean gradients through the valver are 9 and 3 mm respectively This is increased from gradients seen in 2014. There was moderate regurgitation. Valve area by continuity equation (using LVOT flow): 1.27 cm^2. - Left atrium: The atrium was severely dilated. - Right ventricle:  The cavity size was moderately dilated. Wall thickness was normal. - Right atrium: The atrium was mildly dilated. - Pericardium, extracardiac: A trivial pericardial effusion was identified.  TEE - - Left ventricle: The cavity size was normal. Wall thickness was normal. The estimated ejection fraction was 55%. Wall motion was normal; there were no regional wall motion abnormalities. - Aortic valve: There was no stenosis. There was trivial regurgitation. - Aorta: Normal caliber aorta with no significant plaque. - Mitral valve: The patient is s/p mitral valve repair with annuloplasty ring. There does appear to be a loose suture posteriorly with a small area of annuloplasty ring dehiscence. There is moderate mitral regurgitation with one perivalvular jet posteriorly and a small central jet. There is no significant stenosis. No vegetation noted. - Left atrium: There was mild left atrial enlargement. The LA appendage has been oversewn with a very small residual connection to the LA. There was no thrombus in the appendage. - Right ventricle: The cavity size was mildly dilated. Systolic function was normal. - Right atrium: No evidence of thrombus in the atrial cavity or appendage. - Atrial septum: No defect or patent foramen ovale was identified. Echo contrast study showed no right-to-left atrial level shunt, at baseline or with provocation.  Component     Latest Ref Rng 03/07/2015  Cholesterol     0 - 200 mg/dL 604160  Triglycerides     <150 mg/dL 94  HDL Cholesterol     >39 mg/dL 48  Total CHOL/HDL Ratio      3.3  VLDL     0 - 40 mg/dL 19  LDL (calc)  0 - 99 mg/dL 93  Hemoglobin Z6X     4.8 - 5.6 % 6.0 (H)  Mean Plasma Glucose      126   Assessment: As you may recall, he is a 81 y.o. Caucasian male with PMH of Parkinson's disease followed with [facility], mitral valve replacement in 2008 and dyslipidemia was admitted on 03/05/15 for right facial droop  and expressive aphasia, rapid improving. Stroke work up so far negative except MRI showed left frontal cortical small and subtle infarct. TEE negative and loop recorder placed. He has not called by cardiology for positive finding of afib but there is one episode in 03/2015 suspicious for afib, need contact cardiology to clarify. He is a candidate for RESPECT ESUS candidate and information given.  Plan:  - continue ASA and lipitor for stroke prevention. - check BP at home - follow up with [facility] for PD - seems to be a candidate for RESPECT ESUS trial and information given. Will follow up. - will check with cardiology about the suspicious episode on loop recorder.  - follow up with cardiology as scheduled. - Follow up with your primary care physician for stroke risk factor modification. Recommend maintain blood pressure goal <130/80, diabetes with hemoglobin A1c goal below 6.5% and lipids with LDL cholesterol goal below 70 mg/dL.  - RTC in 4 months  No orders of the defined types were placed in this encounter.    Meds ordered this encounter  Medications  . donepezil (ARICEPT) 5 MG tablet    Sig:     Patient Instructions  - continue ASA and lipitor for stroke prevention - check BP at home - follow up with [facility] for PD - will give you some information regarding the clinic trials. Will give you a call in one week - will check with cardiology about the suspicious episode on loop recorder.  - follow up with cardiology as scheduled. - Follow up with your primary care physician for stroke risk factor modification. Recommend maintain blood pressure goal <130/80, diabetes with hemoglobin A1c goal below 6.5% and lipids with LDL cholesterol goal below 70 mg/dL.  - follow up in 4 months   Marvel Plan, MD PhD [facility] [address] [place], [place] [address] [phone]

## 2015-06-02 NOTE — Patient Instructions (Signed)
-   continue ASA and lipitor for stroke prevention - check BP at home - follow up with [facility] for PD - will give you some information regarding the clinic trials. Will give you a call in one week - will check with cardiology about the suspicious episode on loop recorder.  - follow up with cardiology as scheduled. - Follow up with your primary care physician for stroke risk factor modification. Recommend maintain blood pressure goal <130/80, diabetes with hemoglobin A1c goal below 6.5% and lipids with LDL cholesterol goal below 70 mg/dL.  - follow up in 4 months

## 2015-06-03 ENCOUNTER — Encounter: Payer: Self-pay | Admitting: Internal Medicine

## 2015-06-04 ENCOUNTER — Telehealth: Payer: Self-pay | Admitting: Neurology

## 2015-06-04 NOTE — Telephone Encounter (Signed)
Called pt home phone number and talked with wife and let her know that episode recorded in loop recorder is not afib. Continue the current treatment plan. She expressed understanding and appreciation.  Marvel PlanJindong Jesse Nosbisch, MD PhD Stroke Neurology 06/04/2015 9:07 AM

## 2015-06-10 ENCOUNTER — Encounter: Payer: Self-pay | Admitting: Internal Medicine

## 2015-06-11 ENCOUNTER — Ambulatory Visit (INDEPENDENT_AMBULATORY_CARE_PROVIDER_SITE_OTHER): Payer: Medicare Other | Admitting: *Deleted

## 2015-06-11 DIAGNOSIS — I639 Cerebral infarction, unspecified: Secondary | ICD-10-CM

## 2015-06-11 NOTE — Progress Notes (Signed)
Loop recorder 

## 2015-06-12 LAB — CUP PACEART REMOTE DEVICE CHECK: MDC IDC SESS DTM: 20160720040500

## 2015-06-16 ENCOUNTER — Encounter: Payer: Self-pay | Admitting: Internal Medicine

## 2015-06-17 ENCOUNTER — Telehealth: Payer: Self-pay

## 2015-06-17 NOTE — Telephone Encounter (Signed)
I left a message for the patient to return my call.

## 2015-06-25 ENCOUNTER — Encounter: Payer: Self-pay | Admitting: *Deleted

## 2015-06-25 ENCOUNTER — Ambulatory Visit (INDEPENDENT_AMBULATORY_CARE_PROVIDER_SITE_OTHER): Payer: Medicare Other | Admitting: Cardiology

## 2015-06-25 ENCOUNTER — Encounter: Payer: Self-pay | Admitting: Cardiology

## 2015-06-25 VITALS — BP 124/72 | HR 70 | Ht 66.0 in | Wt 176.0 lb

## 2015-06-25 DIAGNOSIS — I340 Nonrheumatic mitral (valve) insufficiency: Secondary | ICD-10-CM

## 2015-06-25 DIAGNOSIS — I63412 Cerebral infarction due to embolism of left middle cerebral artery: Secondary | ICD-10-CM

## 2015-06-25 DIAGNOSIS — E785 Hyperlipidemia, unspecified: Secondary | ICD-10-CM | POA: Diagnosis not present

## 2015-06-25 DIAGNOSIS — Z9889 Other specified postprocedural states: Secondary | ICD-10-CM

## 2015-06-25 LAB — CBC WITH DIFFERENTIAL/PLATELET
BASOS ABS: 0 10*3/uL (ref 0.0–0.1)
BASOS PCT: 0.6 % (ref 0.0–3.0)
EOS ABS: 0.1 10*3/uL (ref 0.0–0.7)
Eosinophils Relative: 1.3 % (ref 0.0–5.0)
HEMATOCRIT: 44 % (ref 39.0–52.0)
Hemoglobin: 14.8 g/dL (ref 13.0–17.0)
Lymphocytes Relative: 21.4 % (ref 12.0–46.0)
Lymphs Abs: 1.5 10*3/uL (ref 0.7–4.0)
MCHC: 33.7 g/dL (ref 30.0–36.0)
MCV: 94.6 fl (ref 78.0–100.0)
MONOS PCT: 6.2 % (ref 3.0–12.0)
Monocytes Absolute: 0.4 10*3/uL (ref 0.1–1.0)
Neutro Abs: 4.9 10*3/uL (ref 1.4–7.7)
Neutrophils Relative %: 70.5 % (ref 43.0–77.0)
PLATELETS: 144 10*3/uL — AB (ref 150.0–400.0)
RBC: 4.65 Mil/uL (ref 4.22–5.81)
RDW: 14.4 % (ref 11.5–15.5)
WBC: 6.9 10*3/uL (ref 4.0–10.5)

## 2015-06-25 LAB — LIPID PANEL
CHOL/HDL RATIO: 3
Cholesterol: 152 mg/dL (ref 0–200)
HDL: 49.5 mg/dL (ref >39.00)
LDL CALC: 77 mg/dL (ref 0–99)
NonHDL: 102.36
Triglycerides: 129 mg/dL (ref 0.0–149.0)
VLDL: 25.8 mg/dL (ref 0.0–40.0)

## 2015-06-25 LAB — BASIC METABOLIC PANEL
BUN: 27 mg/dL — ABNORMAL HIGH (ref 6–23)
CALCIUM: 9 mg/dL (ref 8.4–10.5)
CHLORIDE: 103 meq/L (ref 96–112)
CO2: 29 mEq/L (ref 19–32)
Creatinine, Ser: 1.07 mg/dL (ref 0.40–1.50)
GFR: 70.47 mL/min (ref >60.00)
Glucose, Bld: 102 mg/dL — ABNORMAL HIGH (ref 70–99)
POTASSIUM: 4.1 meq/L (ref 3.5–5.1)
Sodium: 137 mEq/L (ref 135–145)

## 2015-06-25 LAB — LACTATE DEHYDROGENASE: LDH: 241 U/L (ref 94–250)

## 2015-06-25 NOTE — Progress Notes (Signed)
Patient ID: [name] [name], male   DOB: 1931-12-27, 81 y.o.   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  Patient had a CVA in 4/16 with right facial droop and expressive aphasia.  This resolved completely.  Echo during that admission showed abnormality of the repaired mitral valve so TEE was done.  TEE in 4/16 showed suspected loose suture material with a small area of annuloplasty ring dehiscence leading to moderate mitral regurgitation.  He had implanted loop recorder placed.   Generally, he has been doing well.  Loop recorder has not shown any atrial fibrillation.  He gets short of breath with heavy exertion but generally does fine with his ADLs.  No trouble climbing a flight of steps.  No orthopnea/PND.  No lightheadedness or syncope.  No palpitations. No chest pain. Gait is slowed from Parkinson's.    ECG (4/16): NSR with PVC  Labs (4/16) with LDL 93, K 4.3, creatinine 4.09  PMH: 1. Mitral regurgitation: MV repair in 2008 with flail P2 segment in setting of MV endocarditis.  LA appendage was oversewn.  Echo (4/16) with EF 55-60%, mild AI, MV repair with mean gradient 3 mmHg, moderate MR, RV moderately dilated with normal systolic function.  TEE (4/16) with EF 55%, s/p MV repair with suspected loose suture material and small area of annuloplasty ring dehiscence causing moderate MR, LAA oversewn, mildly dilated RV with normal systolic function.   2. Parkinsons disease: Followed at [facility].  3. CVA: 4/16 with right facial droop and expressive aphasia.  This resolved completely.  Carotid dopplers 4/16 showed mild plaque only.  He has ILR.  4. LHC (5/08) with no CAD.  5. Hyperlipidemia  SH: Nonsmoker, married, retired from [employer].   FH: Mother MI and CVA, valvular heart disease in brother.   ROS: All systems reviewed and negative except as per HPI.    Current Outpatient Prescriptions  Medication Sig Dispense Refill  . acetaminophen (TYLENOL) 325 MG tablet Take 325 mg by mouth every 6 (six) hours as needed for headache.     Marland Kitchen amantadine (SYMMETREL) 100 MG capsule Take 100 mg by mouth 2 (two) times daily.    Marland Kitchen aspirin 325 MG tablet Take 1 tablet (325 mg total) by mouth daily. 30 tablet 0  . atorvastatin (LIPITOR) 20 MG tablet Take 20 mg by mouth daily at 6 PM.     . cholecalciferol (VITAMIN D) 1000 UNITS tablet Take 1,000 Units by mouth daily.    Tery Sanfilippo Calcium (STOOL SOFTENER PO) Take by mouth as needed.    . donepezil (ARICEPT) 5 MG tablet Take 5 mg by mouth at bedtime.     Marland Kitchen escitalopram (LEXAPRO) 10 MG tablet Take 10 mg by mouth daily.    Marland Kitchen ibandronate (BONIVA) 150 MG tablet Take 1 tablet by mouth every 30 (thirty) days.    . Multiple Vitamins-Minerals (MULTIVITAMIN PO) Take 1 tablet by mouth daily.     . rasagiline (AZILECT) 1 MG TABS tablet Take 1 mg by mouth every morning. .    . rotigotine (NEUPRO) 4 MG/24HR Place 1 patch onto the skin daily.    Marland Kitchen RYTARY 48.75-195 MG CPCR Take 9 capsules by mouth See admin instructions. 3 caps in the morning with Azelect, 3 caps at 1pm with Amantadine, 2 caps at 6pm with Amantadine and 1 cap at bedtime.    Marland Kitchen  vitamin B-12 (CYANOCOBALAMIN) 1000 MCG tablet Take 1,000 mcg by mouth daily.    . vitamin E 400 UNIT capsule Take 400 Units by mouth daily.    Marland Kitchen amoxicillin (AMOXIL) 500 MG capsule Take 500 mg by mouth See admin instructions. Pt only takes 500mg  prior to dental appt     No current facility-administered medications for this visit.   BP 124/72 mmHg  Pulse 70  Ht 5\' 6"  (1.676 m)  Wt 176 lb (79.833 kg)  BMI 28.42 kg/m2  SpO2 97% General: NAD Neck: No JVD, no thyromegaly or thyroid nodule.  Lungs: Clear to auscultation bilaterally with normal respiratory effort. CV: Nondisplaced PMI.  Heart regular S1/S2, no S3/S4, 3/6 HSM at apex.  No peripheral edema.  No carotid bruit.  Normal pedal  pulses.  Abdomen: Soft, nontender, no hepatosplenomegaly, no distention.  Skin: Intact without lesions or rashes.  Neurologic: Alert and oriented x 3.  Psych: Normal affect. Extremities: No clubbing or cyanosis.  HEENT: Normal.   Assessment/Plan: 1. Mitral regurgitation: S/p MV repair in 2008.  Now with loose suture material and dehiscence of a small area of the annuloplasty ring leading to moderate MR by TEE.  He is not volume overloaded on exam and has only mild, stable exertional dyspnea.   - Follow MV closely over time.  I will repeat echo at 6 months in 10/16.  He would be difficult candidate for MV replacement given age and prior sternotomy.   I will have to review the TEE, but percutaneous approach with "plugging of the hole" may be an option if needed in the future.  - Will need endocarditis prophylaxis with dental work.  - Send LDH and CBC to look for any evidence for hemolysis.  2. CVA: Cryptogenic.  He now has an ILR to monitor for atrial fibrillation.  I reviewed most recent readings from the ILR, no atrial fibrillation.  Continue ASA 81 and statin.  3. Hyperlipidemia: Check lipids today.   Marca Ancona 06/25/2015

## 2015-06-25 NOTE — Patient Instructions (Addendum)
Medication Instructions:  No changes today.  Labwork: BMET/CBCd/Lipid profile/LDH today  Testing/Procedures: Your physician has requested that you have an echocardiogram. Echocardiography is a painless test that uses sound waves to create images of your heart. It provides your doctor with information about the size and shape of your heart and how well your heart's chambers and valves are working. This procedure takes approximately one hour. There are no restrictions for this procedure. October 2016    Follow-Up: Your physician wants you to follow-up in: 6 months with Dr Shirlee Latch. (February 2017).  You will receive a reminder letter in the mail two months in advance. If you don't receive a letter, please call our office to schedule the follow-up appointment.    Thank you for choosing [facility]!!

## 2015-07-03 ENCOUNTER — Encounter: Payer: Self-pay | Admitting: Internal Medicine

## 2015-07-11 ENCOUNTER — Ambulatory Visit (INDEPENDENT_AMBULATORY_CARE_PROVIDER_SITE_OTHER): Payer: Medicare Other | Admitting: *Deleted

## 2015-07-11 DIAGNOSIS — I639 Cerebral infarction, unspecified: Secondary | ICD-10-CM | POA: Diagnosis not present

## 2015-07-16 NOTE — Progress Notes (Signed)
Loop recorder 

## 2015-07-24 ENCOUNTER — Encounter: Payer: Self-pay | Admitting: Internal Medicine

## 2015-08-11 ENCOUNTER — Ambulatory Visit (INDEPENDENT_AMBULATORY_CARE_PROVIDER_SITE_OTHER): Payer: Medicare Other | Admitting: *Deleted

## 2015-08-11 DIAGNOSIS — I639 Cerebral infarction, unspecified: Secondary | ICD-10-CM | POA: Diagnosis not present

## 2015-08-13 NOTE — Progress Notes (Signed)
Loop recorder 

## 2015-08-15 LAB — CUP PACEART REMOTE DEVICE CHECK: Date Time Interrogation Session: 20160918153513

## 2015-08-15 NOTE — Progress Notes (Signed)
Carelink summary report received. Battery status OK. Normal device function. No new symptom episodes, tachy episodes, brady, or pause episodes. No new AF episodes. Monthly summary reports and ROV with GT in 02/2016. 

## 2015-08-18 LAB — CUP PACEART REMOTE DEVICE CHECK: Date Time Interrogation Session: 20160819150952

## 2015-08-18 NOTE — Progress Notes (Signed)
Carelink summary report received. Battery status OK. Normal device function. No new symptom episodes, tachy episodes, brady, or pause episodes. 4 AF episodes, +ASA , EGMs suggest SR w/PACs and PVCs. Monthly summary reports and ROV with GT in 02/2016.

## 2015-08-28 ENCOUNTER — Encounter: Payer: Self-pay | Admitting: Internal Medicine

## 2015-09-09 ENCOUNTER — Ambulatory Visit (INDEPENDENT_AMBULATORY_CARE_PROVIDER_SITE_OTHER): Payer: Medicare Other | Admitting: *Deleted

## 2015-09-09 DIAGNOSIS — I639 Cerebral infarction, unspecified: Secondary | ICD-10-CM | POA: Diagnosis not present

## 2015-09-11 NOTE — Progress Notes (Signed)
Loop recorder 

## 2015-09-15 ENCOUNTER — Other Ambulatory Visit: Payer: Self-pay

## 2015-09-15 ENCOUNTER — Ambulatory Visit (HOSPITAL_COMMUNITY): Payer: Medicare Other | Attending: Internal Medicine

## 2015-09-15 DIAGNOSIS — I340 Nonrheumatic mitral (valve) insufficiency: Secondary | ICD-10-CM | POA: Insufficient documentation

## 2015-09-15 DIAGNOSIS — I351 Nonrheumatic aortic (valve) insufficiency: Secondary | ICD-10-CM | POA: Insufficient documentation

## 2015-09-15 DIAGNOSIS — E785 Hyperlipidemia, unspecified: Secondary | ICD-10-CM | POA: Diagnosis not present

## 2015-09-15 DIAGNOSIS — I517 Cardiomegaly: Secondary | ICD-10-CM | POA: Insufficient documentation

## 2015-09-15 DIAGNOSIS — I071 Rheumatic tricuspid insufficiency: Secondary | ICD-10-CM | POA: Diagnosis not present

## 2015-09-25 LAB — CUP PACEART REMOTE DEVICE CHECK: MDC IDC SESS DTM: 20161018153713

## 2015-09-25 NOTE — Progress Notes (Signed)
Carelink summary report received. Battery status OK. Normal device function. No new symptom episodes, tachy episodes, brady, or pause episodes. No new AF episodes, +ASA 325mg . Monthly summary reports and ROV with GT in 02/2016.

## 2015-10-02 ENCOUNTER — Ambulatory Visit: Payer: Medicare Other | Attending: Internal Medicine | Admitting: Physical Therapy

## 2015-10-02 ENCOUNTER — Telehealth: Payer: Self-pay | Admitting: Occupational Therapy

## 2015-10-02 ENCOUNTER — Ambulatory Visit: Payer: Medicare Other

## 2015-10-02 ENCOUNTER — Ambulatory Visit: Payer: Medicare Other | Admitting: Occupational Therapy

## 2015-10-02 DIAGNOSIS — R4701 Aphasia: Secondary | ICD-10-CM

## 2015-10-02 DIAGNOSIS — R1310 Dysphagia, unspecified: Secondary | ICD-10-CM

## 2015-10-02 DIAGNOSIS — R258 Other abnormal involuntary movements: Secondary | ICD-10-CM

## 2015-10-02 DIAGNOSIS — R471 Dysarthria and anarthria: Secondary | ICD-10-CM

## 2015-10-02 DIAGNOSIS — R269 Unspecified abnormalities of gait and mobility: Secondary | ICD-10-CM

## 2015-10-02 NOTE — Telephone Encounter (Signed)
Dr. Rubin PayorSiddiqui, [name] was seen for OT , PT, ST Parkinson's screens today. . After screening, OT recommended an evaluation to address fine motor coordination and ADL performance and ST an eval  to address dysarthria and aphasia. Pt continues to exercise in a community exercise program and PT was not recommended at this time. If you agree please make referrals for OT and ST evaluations. Thanks, [employer] CorporationKathryn Rine, OTR/L

## 2015-10-02 NOTE — Therapy (Signed)
[facility] [address] [place], [place], [address] Phone: [phone]   Fax:  [phone]  Patient Details  Name: [name] HensenJohn [name] MRN: [id number] Date of Birth: 03/14/1932 Referring Provider:  Rodrigo RanPerini, Mark, MD  Encounter Date: 10/02/2015  Physical Therapy Parkinson's Screen   Patient and wife report 2-3 falls since April 2016 all related to environmental issues. Denies injuries.  No changes in mobility. They do report decline in balance in evenings with fatigue.  Timed Up & Go 8.77sec (On 02/04/2015 was 10.99sec)  Gait Velocity 8.10 sec = 4.04 ft/sec (On 02/25/2015 was 3.6678ft/sec)  5X Sit to Stand 11.98 sec (On 02/25/2015 was 12.94sec)  PT not recommended at this time. PT recommends continuing with [employer] Exercise group and as HEP along with his walking program. Return for screen 3 months after Speech Therapy & Occupational Therapy is completed.    Vladimir FasterWALDRON,Ronda Kazmi PT, DPT 10/02/2015, 8:13 AM  [facility] [address] [place], [place], [address] Phone: [phone]   Fax:  [phone]

## 2015-10-02 NOTE — Therapy (Signed)
[facility] [address] [place], [place], [address] Phone: [phone]   Fax:  [phone]  Patient Details  Name: Cherly HensenJohn Paddock MRN: [id number] Date of Birth: 01/15/1932 Referring Provider:  Rodrigo RanPerini, Mark, MD  Encounter Date: 10/02/2015  Speech Therapy Parkinson's Disease Screen  Decibel Level today: 68dB  (WNL=70-72 dB) in 6 minutes simple/mod complex conversation, with sound level meter 30cm away from pt's mouth. Pt's conversational volume is below normal. Subjectively, pt appeared to have difficulty with word finding/sentence construction as well.  Pt would benefit from the following speech therapy recommendations: Speech-language eval for dysarthria/aphasia  Reportedly, pt [facility] swallow [facility] 2 years ago with results WNL. Pt states he uses a straw in order to not take too much liquid. 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   [facility]HINKE,Anthonella Klausner , MS, CCC-SLP 10/02/2015, 8:12 AM  [facility] [address] [place], [place], [address] Phone: [phone]   Fax:  [phone]

## 2015-10-02 NOTE — Therapy (Signed)
[facility] [address] [place], [place], [address] Phone: [phone]   Fax:  [phone]  Patient Details  Name: [name] HensenJohn Rosamond MRN: [id number] Date of Birth: 06/29/1932 Referring Provider:  Rodrigo RanPerini, Mark, MD  Encounter Date: 10/02/2015  Occupational Therapy Parkinson's Disease Screen  Physical Performance Test item #2 (simulated eating):  14.31 secs  Physical Performance Test item #4 (donning/doffing jacket):  25.44 secs  9-hole peg test:    RUE  48.13 secs        LUE  37.84 secs    Change in ability to perform ADLs/IADLs:  Pt reports difficulty with fastening buttons.    Other Comments:  Pt would benefit from occupational therapy evaluation due to  Decreased fine motor coordination and increased time required with ADLs.  RINE,KATHRYN 10/02/2015, 8:12 AM Keene BreathKathryn Rine, OTR/L [phone] Phone: [phone] 11:29 AM 10/02/2015 [facility] [address] [place], [place], [address] Phone: [phone]   Fax:  [phone]

## 2015-10-06 ENCOUNTER — Encounter: Payer: Self-pay | Admitting: Neurology

## 2015-10-06 ENCOUNTER — Ambulatory Visit (INDEPENDENT_AMBULATORY_CARE_PROVIDER_SITE_OTHER): Payer: Medicare Other | Admitting: Neurology

## 2015-10-06 VITALS — BP 96/61 | HR 70 | Ht 66.0 in | Wt 187.2 lb

## 2015-10-06 DIAGNOSIS — I63412 Cerebral infarction due to embolism of left middle cerebral artery: Secondary | ICD-10-CM | POA: Diagnosis not present

## 2015-10-06 DIAGNOSIS — E785 Hyperlipidemia, unspecified: Secondary | ICD-10-CM

## 2015-10-06 DIAGNOSIS — G20 Parkinson's disease: Secondary | ICD-10-CM

## 2015-10-06 NOTE — Progress Notes (Signed)
STROKE NEUROLOGY FOLLOW UP NOTE  NAME: [name] HensenJohn [name] DOB: 01/06/1932  REASON FOR VISIT: stroke follow up HISTORY FROM: chart and wife  Today we had the pleasure of seeing [name] [name] in follow-up at our Neurology Clinic. Pt was accompanied by wife.   History Summary [name] is a 81 y.o. male with history of Parkinson's disease [facility], mitral valve replacement in 2008 and dyslipidemia, who developed acute onset of right facial droop and expressive aphasia. He did not receive IV t-PA due to rapidly resolving deficits. Stroke work up so far negative except MRI showed left frontal cortical small and subtle infarct. Concerning for initial large clot breaking off with symptoms improving. TEE also negative for clot or PFO. Loop recorder placed. He was discharged with ASA and lipitor.  Follow up 06/02/15 - the patient has been doing well, no recurrent symptoms. For loop recorder monitoring, so far he has not been called by cardiology. However, on the chart it seems there was on suspicious episode of AF detect in May, will need to clarify with cardiology. BP 104/64 in clinic today. He continues to follow up with [facility].   Interval History During the interval time, pt has been doing well. No recurrent symptoms. Loop recorder did not show afib and he follows with Dr. Shirlee LatchMcLean and confirmed no afib episodes. He is continued on ASA and lipitor. Continued follow up with [facility]. Currently put on aricept. BP 96/61 which was his usual BP. He is not on BP meds. No other complains. He declined RESPECT ESUS trial back in 05/2015.  REVIEW OF SYSTEMS: Full 14 system review of systems performed and notable only for those listed below and in HPI above, all others are negative:  Constitutional:   Cardiovascular:  Ear/Nose/Throat:   Skin:  Eyes:   Respiratory:   Gastroitestinal:   Genitourinary:  Hematology/Lymphatic:   Endocrine:  Musculoskeletal:   Aching muscles  Allergy/Immunology:   Neurological:   Psychiatric: anxious Sleep:   The following represents the patient's updated allergies and side effects list: Allergies  Allergen Reactions  . Ace Inhibitors Cough  . Demerol [Meperidine] Other (See Comments)    Parkinsons disease    The neurologically relevant items on the patient's problem list were reviewed on today's visit.  Neurologic Examination  A problem focused neurological exam (12 or more points of the single system neurologic examination, vital signs counts as 1 point, cranial nerves count for 8 points) was performed.  Blood pressure 96/61, pulse 70, height 5\' 6"  (1.676 m), weight 187 lb 3.2 oz (84.913 kg).  General - Well nourished, well developed, in no apparent distress.  Ophthalmologic - fundi not visualized due to small pupils.  Cardiovascular - Regular rate and rhythm with no murmur.  Mental Status -  Level of arousal and orientation to time, place, and person were intact. Language including expression, naming, repetition, comprehension was assessed and found intact. Fund of Knowledge was assessed and was intact.  Cranial Nerves II - XII - II - Visual field intact OU. III, IV, VI - Extraocular movements intact. V - Facial sensation intact bilaterally. VII - Facial movement intact bilaterally, however, mild right nasolabial fold flattening. VIII - Hearing & vestibular intact bilaterally. X - Palate elevates symmetrically. XI - Chin turning & shoulder shrug intact bilaterally. XII - Tongue protrusion intact.  Motor Strength - The patient's strength was normal in all extremities and pronator drift was absent.  Bulk was normal and fasciculations were absent.  Motor Tone - Muscle tone was assessed at the neck and appendages and was normal and no rigidity.  Reflexes - The patient's reflexes were 1+ in all extremities and he had no pathological reflexes.  Sensory - Light touch, temperature/pinprick  were assessed and were symmetrical.   Coordination - The patient had normal movements in the hands and feet with no ataxia or dysmetria. Tremor was absent.  Gait and Station - mild stooped posturing, slow small stride.  Data reviewed: I personally reviewed the images and agree with the radiology interpretations.  Dg Chest 2 View 03/05/2015 Stable exam. No active cardiopulmonary disease.   Ct Head (brain) Wo Contrast 03/05/2015 Age related cerebral atrophy, ventriculomegaly and periventricular white matter disease. 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 No findings for acute hemispheric infarction or intracranial hemorrhage.   MRI HEAD 03/06/2015 1. Subtly increased 5 mm focus of high signal intensity within the posterior left frontal lobe on DWI sequence as above. Finding is favored to be artifactual in nature, although possible small acute ischemic infarct is not entirely excluded. Correlation with physical exam and history for possible acute ischemia in this territory is recommended. 2. No other acute intracranial infarct or other abnormality identified. 3. Small remote anterior right frontal and posterior left frontal cortical infarcts as above. 4. Generalized cerebral atrophy.   MRA HEAD 03/06/2015 Unremarkable MRA of the intracranial circulation without proximal branch occlusion or hemodynamically significant stenosis.   CUS - Preliminary findings: Bilateral: 1-39% ICA stenosis. Vertebral artery flow is antegrade.   2D echo  03/06/2015 Study Conclusions - Left ventricle: The cavity size was normal. Wall thickness was normal. Systolic function was normal. The estimated ejection fraction was in the range of 55% to 60%. - Aortic valve: There was mild regurgitation. - Mitral valve: s/p MV annuloplasty. Echodenisity on atrial side of anterior mitrla leaflet probably reflects some prolapse, cannot completely exclude small  mass There are several jets of MR, one posterior, one central, one anteiror. Pkea nad mean gradients through the valver are 9 and 3 mm respectively This is increased from gradients seen in 2014. There was moderate regurgitation. Valve area by continuity equation (using LVOT flow): 1.27 cm^2. - Left atrium: The atrium was severely dilated. - Right ventricle: The cavity size was moderately dilated. Wall thickness was normal. - Right atrium: The atrium was mildly dilated. - Pericardium, extracardiac: A trivial pericardial effusion was identified.  TEE - - Left ventricle: The cavity size was normal. Wall thickness was normal. The estimated ejection fraction was 55%. Wall motion was normal; there were no regional wall motion abnormalities. - Aortic valve: There was no stenosis. There was trivial regurgitation. - Aorta: Normal caliber aorta with no significant plaque. - Mitral valve: The patient is s/p mitral valve repair with annuloplasty ring. There does appear to be a loose suture posteriorly with a small area of annuloplasty ring dehiscence. There is moderate mitral regurgitation with one perivalvular jet posteriorly and a small central jet. There is no significant stenosis. No vegetation noted. - Left atrium: There was mild left atrial enlargement. The LA appendage has been oversewn with a very small residual connection to the LA. There was no thrombus in the appendage. - Right ventricle: The cavity size was mildly dilated. Systolic function was normal. - Right atrium: No evidence of thrombus in the atrial cavity or appendage. - Atrial septum: No defect or patent foramen ovale was identified. Echo contrast  study showed no right-to-left atrial level shunt, at baseline or with provocation.  Component     Latest Ref Rng 03/07/2015  Cholesterol     0 - 200 mg/dL 295  Triglycerides     <150 mg/dL 94  HDL Cholesterol     >39 mg/dL 48    Total CHOL/HDL Ratio      3.3  VLDL     0 - 40 mg/dL 19  LDL (calc)     0 - 99 mg/dL 93  Hemoglobin A2Z     4.8 - 5.6 % 6.0 (H)  Mean Plasma Glucose      126   Assessment: As you may recall, he is a 81 y.o. Caucasian male with PMH of Parkinson's disease [facility], mitral valve replacement in 2008 and dyslipidemia was admitted on 03/05/15 for right facial droop and expressive aphasia, rapid improving. Stroke work up so far negative except MRI showed left frontal cortical small and subtle infarct. TEE negative and loop recorder placed. No afib recorded so far. He declined for RESPECT ESUS. He has been doing well during the interval time.  Plan:  - continue ASA and lipitor for stroke prevention - check BP at home - follow up with [facility] for PD - follow up with cardiology as scheduled. - Follow up with your primary care physician for stroke risk factor modification. Recommend maintain blood pressure goal <130/80, diabetes with hemoglobin A1c goal below 6.5% and lipids with LDL cholesterol goal below 70 mg/dL.  - follow up in 6 months.  No orders of the defined types were placed in this encounter.    No orders of the defined types were placed in this encounter.    Patient Instructions  - continue ASA and lipitor for stroke prevention - check BP at home - follow up with [facility] for PD - follow up with cardiology as scheduled. - Follow up with your primary care physician for stroke risk factor modification. Recommend maintain blood pressure goal <130/80, diabetes with hemoglobin A1c goal below 6.5% and lipids with LDL cholesterol goal below 70 mg/dL.  - follow up in 6 months.    Marvel Plan, MD PhD [facility] [address] [place], [place] [address] [phone]

## 2015-10-06 NOTE — Patient Instructions (Signed)
-   continue ASA and lipitor for stroke prevention - check BP at home - follow up with [facility] for PD - follow up with cardiology as scheduled. - Follow up with your primary care physician for stroke risk factor modification. Recommend maintain blood pressure goal <130/80, diabetes with hemoglobin A1c goal below 6.5% and lipids with LDL cholesterol goal below 70 mg/dL.  - follow up in 6 months.

## 2015-10-07 ENCOUNTER — Telehealth: Payer: Self-pay | Admitting: *Deleted

## 2015-10-07 NOTE — Telephone Encounter (Signed)
[facility] send a manual transmission from his LINQ monitor.  Left device clinic phone number if he has questions or concerns.

## 2015-10-09 ENCOUNTER — Ambulatory Visit (INDEPENDENT_AMBULATORY_CARE_PROVIDER_SITE_OTHER): Payer: Medicare Other | Admitting: *Deleted

## 2015-10-09 ENCOUNTER — Encounter: Payer: Self-pay | Admitting: Internal Medicine

## 2015-10-09 DIAGNOSIS — I639 Cerebral infarction, unspecified: Secondary | ICD-10-CM

## 2015-10-09 NOTE — Progress Notes (Signed)
Carelink Summary Report / Loop recorder 

## 2015-10-14 NOTE — Telephone Encounter (Signed)
[facility] #2:  [facility] transmission from his Carelink monitor.  Left device clinic phone number if he has questions or concerns.

## 2015-10-23 NOTE — Telephone Encounter (Signed)
Called [name] [name] back to let her know that manual transmission was successfully received.  She and her husband are going on vacation for a week and she wants to know if her husband should bring his monitor.  Advised her that this is not necessary and that they can send a manual transmission when they return home if she is concerned about his monitor being disconnected.  She voices understanding and denies questions or concerns at this time.

## 2015-10-23 NOTE — Telephone Encounter (Signed)
Spoke with Mrs. Caryn BeeMaier, who states that they attempted to send a manual transmission a few days ago but it was unsuccessful.  Reviewed manual transmission steps with her, and she states she will send one this afternoon.  Advised patient's wife that I will call her back when it is received.  She voices understanding and appreciation.

## 2015-11-04 ENCOUNTER — Ambulatory Visit: Payer: Medicare Other | Admitting: Speech Pathology

## 2015-11-04 ENCOUNTER — Encounter: Payer: Self-pay | Admitting: Internal Medicine

## 2015-11-04 ENCOUNTER — Encounter: Payer: Medicare Other | Admitting: Occupational Therapy

## 2015-11-10 ENCOUNTER — Ambulatory Visit (INDEPENDENT_AMBULATORY_CARE_PROVIDER_SITE_OTHER): Payer: Medicare Other | Admitting: *Deleted

## 2015-11-10 ENCOUNTER — Ambulatory Visit: Payer: Medicare Other | Attending: Nurse Practitioner

## 2015-11-10 ENCOUNTER — Ambulatory Visit: Payer: Medicare Other | Admitting: Occupational Therapy

## 2015-11-10 DIAGNOSIS — R6889 Other general symptoms and signs: Secondary | ICD-10-CM | POA: Diagnosis present

## 2015-11-10 DIAGNOSIS — R1310 Dysphagia, unspecified: Secondary | ICD-10-CM | POA: Diagnosis present

## 2015-11-10 DIAGNOSIS — R2681 Unsteadiness on feet: Secondary | ICD-10-CM | POA: Insufficient documentation

## 2015-11-10 DIAGNOSIS — R4189 Other symptoms and signs involving cognitive functions and awareness: Secondary | ICD-10-CM

## 2015-11-10 DIAGNOSIS — R279 Unspecified lack of coordination: Secondary | ICD-10-CM | POA: Insufficient documentation

## 2015-11-10 DIAGNOSIS — R29898 Other symptoms and signs involving the musculoskeletal system: Secondary | ICD-10-CM

## 2015-11-10 DIAGNOSIS — I639 Cerebral infarction, unspecified: Secondary | ICD-10-CM | POA: Diagnosis not present

## 2015-11-10 DIAGNOSIS — R258 Other abnormal involuntary movements: Secondary | ICD-10-CM | POA: Diagnosis present

## 2015-11-10 DIAGNOSIS — R278 Other lack of coordination: Secondary | ICD-10-CM

## 2015-11-10 DIAGNOSIS — R471 Dysarthria and anarthria: Secondary | ICD-10-CM

## 2015-11-10 LAB — CUP PACEART REMOTE DEVICE CHECK: MDC IDC SESS DTM: 20161219141408

## 2015-11-10 NOTE — Patient Instructions (Signed)
Begin to think about "adding more 'oomph'" to your speaking. This should increase your volume to a more normal level Consider increasing therapy frequency to x2/week when/if you can

## 2015-11-11 ENCOUNTER — Ambulatory Visit: Payer: Medicare Other | Admitting: Physical Therapy

## 2015-11-11 NOTE — Therapy (Signed)
[facility] [address] [place], [place], [address] Phone: [phone]   Fax:  [phone]  Occupational Therapy Evaluation  Patient Details  Name: [name] [name] MRN: [id number] Date of Birth: August 22, 1932 Referring Provider: Dr. Jaquita Folds  Encounter Date: 11/10/2015      OT End of Session - 11/10/15 1836    Visit Number 1   Number of Visits 17   Date for OT Re-Evaluation 01/10/16   Authorization Type [employer] Medicare, G-code needed, no visit limit, no auth    Authorization - Visit Number 1   Authorization - Number of Visits 10   OT Start Time 0848   OT Stop Time 0930   OT Time Calculation (min) 42 min   Activity Tolerance Patient tolerated treatment well   Behavior During Therapy [place] for tasks assessed/performed      Past Medical History  Diagnosis Date  . Parkinson's disease (HCC)   . S/P mitral valve replacement     mitral regurg, endocarditis  . Dyslipidemia   . Stroke [facility]     Past Surgical History  Procedure Laterality Date  . Mitral valve annuloplasty  04/10/2007     26mm Edwards ring; r/t h/o MR and flail mitral leaflets due to endocarditis: MRI safe  . Cataract extraction  2009, 2012    right 2009, left 2012  . Transthoracic echocardiogram  08/15/2012    EF=>55%; normal LV systolic function; RV mildly dilated & systolic function mildly reduced; RA mod dilated; mild -mod MR & mildy increased gradients; mild TR; AV mildly sclerotic with mild-mod regurg  . Loop recorder implant Left 03/13/2015    Procedure: LOOP RECORDER IMPLANT;  Surgeon: Marinus Maw, MD;  Location: [facility] CATH LAB;  Service: Cardiovascular;  Laterality: Left;  . Tee without cardioversion N/A 03/13/2015    Procedure: TRANSESOPHAGEAL ECHOCARDIOGRAM (TEE);  Surgeon: Laurey Morale, MD;  Location: [facility] ENDOSCOPY;  Service: Cardiovascular;  Laterality: N/A;    There were no vitals filed for this visit.  Visit Diagnosis:   Bradykinesia  Rigidity  Decreased coordination  Unsteadiness  Decreased functional activity tolerance  Cognitive deficits      Subjective Assessment - 11/10/15 0852    Subjective  Pt reports that he is doing better than he did at one time.   Patient is accompained by: Family member  wife   Patient Stated Goals improve ability to turn pages, get things out of wallet/pocket, write pick up pills   Currently in Pain? No/denies          [facility] OT Assessment - 11/11/15 0001    Assessment   Diagnosis Parkinson's disease   Referring Provider Dr. Jaquita Folds   Onset Date --  09/2003   Prior Therapy OT screen 10/02/15 indicated that pt may benefit from therapy   Precautions   Precautions Fall   Balance Screen   Has the patient fallen in the past 6 months Yes   How many times? 4-6  trips over objects a lot   Home  Environment   Family/patient expects to be discharged to: Private residence   Lives With Spouse  and large dog   Prior Function   Level of Independence Independent with basic ADLs;Independent with transfers;Independent with gait   Leisure Enjoys walking (bog garden), has exercise bike but doesn't use   ADL   Eating/Feeding --  min spills, tremor   Grooming --  electric razor, min A with combing hair at times   Upper Body Bathing --  incr time   Lower Body Bathing Increased time   Upper Body Dressing Increased time  with fasteners, difficulty/A with jacket   Lower Body Dressing Increased time   Toilet Tranfer --  grab bars, difficulty   Toileting - Clothing Manipulation --  difficulty, urgency with PD   Toileting -  Hygiene Modified Independent   Tub/Shower Transfer Modified independent   Equipment Used --  has shower stall, grab bars, has seat   ADL comments needs to rock and multiple attempts to get up from chair, has to use satin sheet for bed mobility.  Pt/wife reports difficulty turning pages, getting things out of wallet/pocket, buttoning,  writing, picking up pills   IADL   Prior Level of Function Light Housekeeping wife performed   Light Housekeeping --  will vacuum some   Prior Level of Function Meal Prep wife performed   Community Mobility --  per pt shouldn't drive, and fights with wife regarding this    Medication Management Has difficulty remembering to take medication;Is not capable of dispensing or managing own medication  wife assists with meds   Prior Level of Function Financial Management pt performed, but wife performing now for past 6-6571mo   Written Expression   Dominant Hand Right   Handwriting Not legible  per pt/wife   Vision - History   Baseline Vision Wears glasses only for reading   Additional Comments Pt reports diplopia with quick movements   Activity Tolerance   Activity Tolerance Comments wife reports incr difficulty with fatigue   Cognition   Overall Cognitive Status Impaired/Different from baseline   Area of Impairment Awareness  decr awareness of cognitive deficits, safety   Awareness Intellectual  impaired    Attention --  decr reaction time per wife   Memory Impaired  STM   Memory Impairment Decreased short term memory   Bradyphrenia Yes   Observation/Other Assessments   Other Surveys  Select   Physical Performance Test   Yes   Simulated Eating Time (seconds) 14.93   Simulated Eating Comments held spoon at end (bradykinesia)   Donning Doffing Jacket Time (seconds) 33.10   Donning Doffing Jacket Comments Buttoning/unbuttoning 3 buttons on tabletop in 89.85sec   Coordination   9 Hole Peg Test Right;Left   Right 9 Hole Peg Test 36.15   Left 9 Hole Peg Test 33.31, but took out 2 at once   Box and Blocks R-41blocks, L-41blocks   Tremors dyskinesias/postural tremors, resting tremors in hand (L>R)   AROM   Overall AROM  Within functional limits for tasks performed   RUE Tone   RUE Tone --  very mild   LUE Tone   LUE Tone Mild                         OT  Education - 11/10/15 1826    Education provided Yes   Education Details Recommendation for incr frequency (at least 2x week) for improved progress (but pt/wife report financial concerns and desire to attend 1x/wk); Recommended that pt did not drive and educated pt in reasons/safty concerns (cognitive deficits, decr reaction time, intermittent diplopia, hx of accident, decr attention, memory deficits)   Person(s) Educated Patient;Spouse   Methods Explanation   Comprehension Verbalized understanding          OT Short Term Goals - 11/11/15 0808    OT SHORT TERM GOAL #1   Title Pt will be independent with PD-specific HEP.--check STGs 12/11/15  Time 4   Period Weeks   Status New   OT SHORT TERM GOAL #2   Title Pt will improve ability to button as shown by buttoning/unbuttoning 3 buttons on table in less than 75sec.   Baseline 88.85sec   Time 4   Period Weeks   Status New   OT SHORT TERM GOAL #3   Title Pt will be able to pull objects out of pocket with only min incr time in 4/5 trials.   Time 4   Period Weeks   Status New   OT SHORT TERM GOAL #4   Title Pt will be able to write name/address with at least 75% legibility.   Time 4   Period Weeks   Status New           OT Long Term Goals - 11/11/15 1610    OT LONG TERM GOAL #1   Title Pt will verbalize understanding of strategies/AE to assist with ADLs/IADLs prn.--check LTGs 01/11/16   Time 8   Period Weeks   Status New   OT LONG TERM GOAL #2   Title Pt will improve ability to button as shown by buttoning/unbuttoning 3 buttons on table in less than 65sec.   Time 8   Period Weeks   Status New   OT LONG TERM GOAL #3   Title Pt will be able to remove cards/money from wallet with min difficulty/incr time in 4/5 trials.   Time 8   Period Weeks   Status New   OT LONG TERM GOAL #4   Title Pt will be able to write name/address with at least 90% legibility.   Time 8   Period Weeks   Status New   OT LONG TERM GOAL #5    Title Pt will improve coordination for ADLs/picking up pills as shown by improving time on 9-hole peg test by at least 3sec bilaterally.   Baseline R-36.55, L-33.31sec   Time 8   Period Weeks   Status New   Long Term Additional Goals   Additional Long Term Goals Yes   OT LONG TERM GOAL #6   Title Pt will demo improved ability to don/doff jacket as shown by improving time on PPT#4 by at least 4sec.   Baseline 33.10sec   Status New               Plan - 11/10/15 1837    Clinical Impression Statement Pt is a 81 y.o. male with hx of Parkinson's disease diagnosed in 2004 but reports symptoms prior to diagnosis.  Pt reports incr difficulty with fine motor tasks.  Pt presents with bradykinesia, decr coordination, rigidity, decr balance for ADLs, tremors, and cognitive deficits affecting ADL/IADL performance.  Pt would benefit from occupational therapy to incr ease and independence with ADLs, prevent future complications, and improve quality of life.   Pt will benefit from skilled therapeutic intervention in order to improve on the following deficits (Retired) Decreased cognition;Decreased mobility;Impaired vision/preception;Impaired UE functional use;Decreased balance;Decreased activity tolerance;Impaired tone;Decreased safety awareness;Decreased coordination  bradykinesia   Rehab Potential Good   Clinical Impairments Affecting Rehab Potential cognitive defictis   OT Frequency 2x / week  recommended, but pt may only schedule 1x week due to financial concerns   OT Duration 8 weeks  +eval   OT Treatment/Interventions Self-care/ADL training;Cryotherapy;Electrical Stimulation;Moist Heat;Fluidtherapy;Passive range of motion;Visual/perceptual remediation/compensation;Cognitive remediation/compensation;Therapeutic activities;DME and/or AE instruction;Parrafin;Energy conservation;Therapeutic exercises;Neuromuscular education;Manual Therapy;Patient/family education;Marine scientist;Therapeutic exercise;Ultrasound   Plan initiate HEP for coordination/PWR! hands   Consulted  and Agree with Plan of Care Patient;Family member/caregiver   Family Member Consulted wife          G-Codes - 11/20/15 1843    Functional Assessment Tool Used buttoning/unbuttoning 3 buttons on table in 89.85sec, writing illegible per pt/wife, PPT#4 in 33.10sec   Functional Limitation Self care   Self Care Current Status (Z6109) At least 40 percent but less than 60 percent impaired, limited or restricted   Self Care Goal Status (U0454) At least 20 percent but less than 40 percent impaired, limited or restricted      Problem List Patient Active Problem List   Diagnosis Date Noted  . 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 HLD (hyperlipidemia) 06/02/2015  . PD (Parkinson's disease) (HCC) 06/02/2015  . Mitral regurgitation 03/17/2015  . CVA (cerebral infarction)   . TIA (transient ischemic attack) 03/05/2015  . Slurred speech 03/05/2015  . Parkinson's disease (HCC) 08/06/2013  . Dyslipidemia 08/06/2013  . S/P mitral valve repair 08/06/2013    [facility] 11/11/2015, 8:33 AM  [facility] [address] [place], [place], [address] Phone: [phone]   Fax:  [phone]  Name: [name] [name] MRN: [id number] Date of Birth: Mar 21, 1932  Willa Frater, OTR/L 11/11/2015 8:33 AM

## 2015-11-11 NOTE — Progress Notes (Signed)
Carelink Summary Report / Loop Recorder 

## 2015-11-11 NOTE — Therapy (Signed)
[facility] [address] [place], [place], [address] Phone: [phone]   Fax:  [phone]  Speech Language Pathology Evaluation  Patient Details  Name: [name] [name] MRN: [id number] Date of Birth: [birth year] Referring Provider: Otilio Connors  Encounter Date: 11/10/2015      End of Session - 11/10/15 1437    Visit Number 1   Number of Visits 17  although pt requested x1/week   Date for SLP Re-Evaluation 01/09/16   SLP Start Time 0803   SLP Stop Time  0845   SLP Time Calculation (min) 42 min   Activity Tolerance Patient tolerated treatment well      Past Medical History  Diagnosis Date  . Parkinson's disease (HCC)   . S/P mitral valve replacement     mitral regurg, endocarditis  . Dyslipidemia   . Stroke [facility])     Past Surgical History  Procedure Laterality Date  . Mitral valve annuloplasty  04/10/2007     26mm Edwards ring; r/t h/o MR and flail mitral leaflets due to endocarditis: MRI safe  . Cataract extraction  2009, 2012    right 2009, left 2012  . Transthoracic echocardiogram  08/15/2012    EF=>55%; normal LV systolic function; RV mildly dilated & systolic function mildly reduced; RA mod dilated; mild -mod MR & mildy increased gradients; mild TR; AV mildly sclerotic with mild-mod regurg  . Loop recorder implant Left 03/13/2015    Procedure: LOOP RECORDER IMPLANT;  Surgeon: Marinus Maw, MD;  Location: [facility] C[facility] LAB;  Service: Cardiovascular;  Laterality: Left;  . Tee without cardioversion N/A 03/13/2015    Procedure: TRANSESOPHAGEAL ECHOCARDIOGRAM (TEE);  Surgeon: Laurey Morale, MD;  Location: [facility] ENDOSCOPY;  Service: Cardiovascular;  Laterality: N/A;    There were no vitals filed for this visit.  Visit Diagnosis: Dysarthria  Dysphagia          SLP Evaluation [facility] - 11/10/15 6962    SLP Visit Information   SLP Received On 11/10/15   Referring Provider Otilio Connors   Medical Diagnosis  Parkinson's Disease   Pain Assessment   Currently in Pain? No/denies   General Information   HPI Pt reports he has been diagnosed with PD for 13 years. Pt is participant in the Parkinson's exercise classes. He reports coughing approx 1-2/day.   Prior Functional Status   Cognitive/Linguistic Baseline Within functional limits   Type of Home House    Lives With Spouse   Vocation Retired   IT consultant Comprehension   Overall Auditory Comprehension Appears within functional limits for tasks assessed   Verbal Expression   Overall Verbal Expression Appears within functional limits for tasks assessed   Oral Motor/Sensory Function   Overall Oral Motor/Sensory Function Impaired   Labial ROM Reduced left   Labial Sensation Within Functional Limits   Labial Coordination Reduced   Lingual Strength Reduced Left   Lingual Sensation Within Functional Limits   Lingual Coordination Reduced   Facial Strength Within Functional Limits   Velum Within Functional Limits   Motor Speech   Overall Motor Speech Impaired  dyskinesias minimally affected pt's intelligibility   Respiration Impaired   Level of Impairment Conversation   Effective Techniques Increased vocal intensity        Seven minutes of conversational speech was reduced today, at average 69dB (WNL= average 70-73dB) with range of 66-72dB, when a sound level meter was placed 30 cm away from pt's mouth. Pt noted to have great variability in his volume,  even within a single sentence. Overall intelligibility for this listener in a quiet environment was approx 95%, however he and wife agree wife routinely (and other people to a lesser degree) have difficulty understanding him. Production of loud /a/ averaged 94dB (range of 86 to 96) and min-mod cues occasionally needed for loudness.   In 5 minutes conversation following evaluation tasks, pt was asked to use the same effort as with loud /a/. Loudness average with this increased effort was 72dB (range of  67 to 73) with min A rarely for loudness. Pt would benefit from skilled ST in order to improve consistency with speech loudness and thus increasing speech intelligibility and his overall QOL.                  SLP Education - 11/10/15 1436    Education provided Yes   Education Details best to have speech therapy at x2/week due to consistency is better for progress, loud /a/   Person(s) Educated Patient   Methods Explanation;Demonstration;Verbal cues   Comprehension Verbalized understanding;Returned demonstration;Verbal cues required;Need further instruction          SLP Short Term Goals - 11/10/15 1039    SLP SHORT TERM GOAL #1   Title pt will maintain loud /a/ average 95dB x 4 sessions   Time 4   Period Weeks  or 4 visits, for all STGs   Status New   SLP SHORT TERM GOAL #2   Title pt will demo 18/20 sentences with average 70dB x2 sessions   Time 4   Period Weeks   Status New   SLP SHORT TERM GOAL #3   Title pt will tell SLP of reason to perform loud /a/ as directed (at least x5 reps, x2/day)   Time 4   Period Weeks   Status New   SLP SHORT TERM GOAL #4   Title pt will demo abdominal breatihng at rest 75% of the time over 3 minutes   Time 4   Period Weeks   Status New          SLP Long Term Goals - 11/10/15 1439    SLP LONG TERM GOAL #1   Title pt will demo loudness average 70dB in 8 minutes simple conversation with rare min A for improving loudness/breath support   Time 8   Period Weeks   Status New   SLP LONG TERM GOAL #2   Title pt will demo abdominal breathing for 50% of a simple 8 minute conversation   Time 8   Period Weeks   Status New   SLP LONG TERM GOAL #3   Title pt will tell SLP 3 s/s aspiration PNA with modified independence   Time 8   Period Weeks   Status New          Plan - 11/10/15 0846    Clinical Impression Statement Pt presents with mild reduced speech loudness due to decr'd breath support, and coughing with meals approx  x1-2/day. I recommend skilled ST twice a week to address pt's breath support and improve his ability to communicate. If swallow exam is ordered, therapy focus on swallow strategies an dpossible HEP will be addressed.    Speech Therapy Frequency 1x /week  pt prefers once a week due to financial and scheduling reasons   Duration --  8 weeks   Treatment/Interventions Diet toleration management by SLP;Patient/family education;Functional tasks;Compensatory strategies;SLP instruction and feedback;Pharyngeal strengthening exercises;Aspiration precaution training;Cueing hierarchy  Dysphagia goals PRN   Potential to  Achieve Goals Good   Consulted and Agree with Plan of Care Patient          G-Codes - 12-01-2015 1442    Functional Assessment Tool Used noms- 6 - 25%   Functional Limitations Motor speech   Motor Speech Current Status [phone]) At least 20 percent but less than 40 percent impaired, limited or restricted   Motor Speech Goal Status ([id number]) At least 1 percent but less than 20 percent impaired, limited or restricted      Problem List Patient Active Problem List   Diagnosis Date Noted  . 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 HLD (hyperlipidemia) 06/02/2015  . PD (Parkinson's disease) (HCC) 06/02/2015  . Mitral regurgitation 03/17/2015  . CVA (cerebral infarction)   . TIA (transient ischemic attack) 03/05/2015  . Slurred speech 03/05/2015  . Parkinson's disease (HCC) 08/06/2013  . Dyslipidemia 08/06/2013  . S/P mitral valve repair 08/06/2013    [facility] , MS, CCC-SLP 11/11/2015, 9:54 AM  [facility] [address] [place], [place], [address] Phone: [phone]   Fax:  [phone]  Name: [name] [name] MRN: [id number] Date of Birth: 08-05-1932

## 2015-11-20 ENCOUNTER — Encounter: Payer: Self-pay | Admitting: Physical Therapy

## 2015-11-20 NOTE — Therapy (Signed)
[facility] [address] [place], [place], [address] Phone: [phone]   Fax:  [phone]  Patient Details  Name: [name] HensenJohn [name] MRN: [id number] Date of Birth: 05/29/1932 Referring Provider:  No ref. provider found  Encounter Date: 11/20/2015  Disregard-this note created in error.  Gean MaidensMARRIOTT,Sanyia Dini W., PT  Lonia BloodMARRIOTT,Haelee Bolen W. 11/20/2015, 8:30 AM  [facility] [address] [place], [place], [address] Phone: [phone]   Fax:  [phone]

## 2015-11-26 ENCOUNTER — Ambulatory Visit: Payer: Medicare Other | Attending: Nurse Practitioner

## 2015-11-26 ENCOUNTER — Ambulatory Visit: Payer: Medicare Other | Admitting: Occupational Therapy

## 2015-11-26 DIAGNOSIS — R4701 Aphasia: Secondary | ICD-10-CM | POA: Diagnosis present

## 2015-11-26 DIAGNOSIS — R258 Other abnormal involuntary movements: Secondary | ICD-10-CM | POA: Diagnosis present

## 2015-11-26 DIAGNOSIS — R4189 Other symptoms and signs involving cognitive functions and awareness: Secondary | ICD-10-CM

## 2015-11-26 DIAGNOSIS — R279 Unspecified lack of coordination: Secondary | ICD-10-CM

## 2015-11-26 DIAGNOSIS — R1310 Dysphagia, unspecified: Secondary | ICD-10-CM | POA: Insufficient documentation

## 2015-11-26 DIAGNOSIS — R29898 Other symptoms and signs involving the musculoskeletal system: Secondary | ICD-10-CM | POA: Insufficient documentation

## 2015-11-26 DIAGNOSIS — R2681 Unsteadiness on feet: Secondary | ICD-10-CM | POA: Diagnosis present

## 2015-11-26 DIAGNOSIS — R471 Dysarthria and anarthria: Secondary | ICD-10-CM | POA: Diagnosis not present

## 2015-11-26 DIAGNOSIS — R278 Other lack of coordination: Secondary | ICD-10-CM

## 2015-11-26 NOTE — Patient Instructions (Signed)
Coordination Exercises  Perform the following exercises for 20 minutes 1 times per day. Perform with both hand(s). Perform using big movements.   Flipping Cards: Place deck of cards on the table. Flip cards over by opening your hand big to grasp and then turn your palm up big while opening hand.  Deal cards: Hold 1/2 or whole deck in your hand. Use thumb to push card off top of deck with one big push.  Pick up coins and place in coin bank or container: Pick up with big, intentional movements. Do not drag coin to the edge. (open hand before)  Pick up coins and stack one at a time: Pick up with big, intentional movements. Do not drag coin to the edge. (5-10 in a stack)--open hand before picking up  Pick up 5-10 coins one at a time and hold in palm. Then, move coins from palm to fingertips one at time with big movementsand place in coin bank/container.  Practice writing: Slow down, write big, and focus on forming each letter.  Perform "Flicks"/hand stretches (PWR! Hands): Close hands then flick out your fingers with focus on opening hands, pulling wrists back, and extending elbows like you are pushing.  Pick up various small objects that are different shapes/sizes and place in container. (paperclips, buttons, keys, dried beans/pasta, coins, screws, nuts/bolts, washers, board game pieces, etc.)  Fasten nuts/bolts or put on bottle caps: Turn as much/as big as you can with each turn.

## 2015-11-26 NOTE — Patient Instructions (Signed)
   Practice abdominal breathing 15 minutes, twice a day - make sure it's quiet, and you are relaxing on the exhale, not forcing air out  Do the speech tasks using abdominal breathing before you verbalize your answer.

## 2015-11-26 NOTE — Therapy (Signed)
[facility] [address] [place], [place], [address] Phone: [phone]   Fax:  [phone]  Occupational Therapy Treatment  Patient Details  Name: [name] HensenJohn [name] MRN: [id number] Date of Birth: 10/03/1932 Referring Provider: Dr. Jaquita FoldsMustafa Siddiqui  Encounter Date: 11/26/2015      OT End of Session - 11/26/15 1031    Visit Number 2   Number of Visits 17   Date for OT Re-Evaluation 01/10/16   Authorization Type [employer] Medicare, G-code needed, no visit limit, no auth    Authorization - Visit Number 2   Authorization - Number of Visits 10   OT Start Time 0808   OT Stop Time 0847   OT Time Calculation (min) 39 min   Activity Tolerance Patient tolerated treatment well   Behavior During Therapy [facility] for tasks assessed/performed      Past Medical History  Diagnosis Date  . Parkinson's disease (HCC)   . S/P mitral valve replacement     mitral regurg, endocarditis  . Dyslipidemia   . Stroke [facility](HCC)     Past Surgical History  Procedure Laterality Date  . Mitral valve annuloplasty  04/10/2007     26mm Edwards ring; r/t h/o MR and flail mitral leaflets due to endocarditis: MRI safe  . Cataract extraction  2009, 2012    right 2009, left 2012  . Transthoracic echocardiogram  08/15/2012    EF=>55%; normal LV systolic function; RV mildly dilated & systolic function mildly reduced; RA mod dilated; mild -mod MR & mildy increased gradients; mild TR; AV mildly sclerotic with mild-mod regurg  . Loop recorder implant Left 03/13/2015    Procedure: LOOP RECORDER IMPLANT;  Surgeon: Marinus MawGregg W Taylor, MD;  [facility];  Service: Cardiovascular;  Laterality: Left;  . Tee without cardioversion N/A 03/13/2015    Procedure: TRANSESOPHAGEAL ECHOCARDIOGRAM (TEE);  Surgeon: Laurey Moralealton S McLean, MD;  [facility];  Service: Cardiovascular;  Laterality: N/A;    There were no vitals filed for this visit.  Visit Diagnosis:   Bradykinesia  Rigidity  Decreased coordination  Cognitive deficits      Subjective Assessment - 11/26/15 1027    Subjective  "This has been so helpful."--wife;  "This is a workout"--pt   Patient is accompained by: Family member   Patient Stated Goals improve ability to turn pages, get things out of wallet/pocket, write pick up pills   Currently in Pain? No/denies                              OT Education - 11/26/15 1028    Education Details General PD info regarding changes in brain and how it affects UE movement/function;  Coordination HEP and PWR! hands; use of big movement strategies and PWR! hands for picking up objects and opening bottles/containers;  Instructed in importance of feedback regarding large amplitude movement/quality of movement   Person(s) Educated Patient;Spouse   Methods Explanation;Demonstration;Verbal cues;Handout;Tactile cues   Comprehension Verbalized understanding;Returned demonstration;Verbal cues required;Tactile cues required;Need further instruction  mod cues provided          OT Short Term Goals - 11/11/15 0808    OT SHORT TERM GOAL #1   Title Pt will be independent with PD-specific HEP.--check STGs 12/11/15    Time 4   Period Weeks   Status New   OT SHORT TERM GOAL #2   Title Pt will improve ability to button as shown by buttoning/unbuttoning 3 buttons  on table in less than 75sec.   Baseline 88.85sec   Time 4   Period Weeks   Status New   OT SHORT TERM GOAL #3   Title Pt will be able to pull objects out of pocket with only min incr time in 4/5 trials.   Time 4   Period Weeks   Status New   OT SHORT TERM GOAL #4   Title Pt will be able to write name/address with at least 75% legibility.   Time 4   Period Weeks   Status New           OT Long Term Goals - 11/11/15 1610    OT LONG TERM GOAL #1   Title Pt will verbalize understanding of strategies/AE to assist with ADLs/IADLs prn.--check LTGs 01/11/16   Time 8    Period Weeks   Status New   OT LONG TERM GOAL #2   Title Pt will improve ability to button as shown by buttoning/unbuttoning 3 buttons on table in less than 65sec.   Time 8   Period Weeks   Status New   OT LONG TERM GOAL #3   Title Pt will be able to remove cards/money from wallet with min difficulty/incr time in 4/5 trials.   Time 8   Period Weeks   Status New   OT LONG TERM GOAL #4   Title Pt will be able to write name/address with at least 90% legibility.   Time 8   Period Weeks   Status New   OT LONG TERM GOAL #5   Title Pt will improve coordination for ADLs/picking up pills as shown by improving time on 9-hole peg test by at least 3sec bilaterally.   Baseline R-36.55, L-33.31sec   Time 8   Period Weeks   Status New   Long Term Additional Goals   Additional Long Term Goals Yes   OT LONG TERM GOAL #6   Title Pt will demo improved ability to don/doff jacket as shown by improving time on PPT#4 by at least 4sec.   Baseline 33.10sec   Status New               Plan - 11/26/15 1031    Clinical Impression Statement Pt responded well to verbal and tactile cues for big movement strategies with repetition.  Pt was then able to demo improved ability to pick up objects.   Plan big movement strategies for ADLs   Consulted and Agree with Plan of Care Patient;Family member/caregiver   Family Member Consulted wife        Problem List Patient Active Problem List   Diagnosis Date Noted  . 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 HLD (hyperlipidemia) 06/02/2015  . PD (Parkinson's disease) (HCC) 06/02/2015  . Mitral regurgitation 03/17/2015  . CVA (cerebral infarction)   . TIA (transient ischemic attack) 03/05/2015  . Slurred speech 03/05/2015  . Parkinson's disease (HCC) 08/06/2013  . Dyslipidemia 08/06/2013  . S/P mitral valve repair 08/06/2013    [facility] 11/26/2015, 10:34 AM  [facility] [address] [place], [place], [address] Phone: [phone]   Fax:  [phone]  Name: [name] [name] MRN: [id number] Date of Birth: [birth year]  Willa Frater, OTR/L 11/26/2015 10:34 AM

## 2015-11-27 NOTE — Therapy (Signed)
[facility] [address] [place], [place], [address] Phone: [phone]   Fax:  [phone]  Speech Language Pathology Treatment  Patient Details  Name: [name] [name] MRN: [id number] Date of Birth: 06/25/1932 Referring Provider: Otilio Connors  Encounter Date: 11/26/2015      End of Session - 11/26/15 1325    Visit Number 2   Number of Visits 17  pt requested once a week   Date for SLP Re-Evaluation 01/09/16   SLP Start Time 0848   SLP Stop Time  0930   SLP Time Calculation (min) 42 min   Activity Tolerance Patient tolerated treatment well      Past Medical History  Diagnosis Date  . Parkinson's disease (HCC)   . S/P mitral valve replacement     mitral regurg, endocarditis  . Dyslipidemia   . Stroke [facility])     Past Surgical History  Procedure Laterality Date  . Mitral valve annuloplasty  04/10/2007     26mm Edwards ring; r/t h/o MR and flail mitral leaflets due to endocarditis: MRI safe  . Cataract extraction  2009, 2012    right 2009, left 2012  . Transthoracic echocardiogram  08/15/2012    EF=>55%; normal LV systolic function; RV mildly dilated & systolic function mildly reduced; RA mod dilated; mild -mod MR & mildy increased gradients; mild TR; AV mildly sclerotic with mild-mod regurg  . Loop recorder implant Left 03/13/2015    Procedure: LOOP RECORDER IMPLANT;  Surgeon: Marinus Maw, MD;  Location[facility] CATH LAB;  Service: Cardiovascular;  Laterality: Left;  . Tee without cardioversion N/A 03/13/2015    Procedure: TRANSESOPHAGEAL ECHOCARDIOGRAM (TEE);  Surgeon: Laurey Morale, MD;  Location: [facility] ENDOSCOPY;  Service: Cardiovascular;  Laterality: N/A;    There were no vitals filed for this visit.  Visit Diagnosis: Dysarthria  Aphasia  Dysphagia      Subjective Assessment - 11/26/15 0854    Subjective Pt arrives with wife today.   Patient is accompained by: Family member   Currently in Pain? No/denies                ADULT SLP TREATMENT - 11/26/15 0856    General Information   Behavior/Cognition Alert;Cooperative;Pleasant mood   Treatment Provided   Treatment provided Cognitive-Linquistic   Cognitive-Linquistic Treatment   Treatment focused on Dysarthria   Skilled Treatment In order to work with patient's dysarthria/reduced speech volume and breath support SLP had pt read sentences which were all within normal limits, but approx 35% were with reduced breath support. SLP educated pt about abdominal breathing (AB) and had pt practice at rest. Pt with very shallow AB requiring SLP cues to lengthen inhalation and for relaxed exhalation - pt pushing breath out. Pt 80% successful with AB with vocalizing "Mamama" repeatedly. In simple question and answer tasks, pt 50% successful with AB prior to answering, and 10% of the time attempted to speak on residual air.   Assessment / Recommendations / Plan   Plan Continue with current plan of care   Progression Toward Goals   Progression toward goals Progressing toward goals          SLP Education - 11/26/15 1325    Education provided Yes   Education Details Abdominal breathing   Person(s) Educated Patient;Spouse   Methods Explanation;Demonstration;Verbal cues   Comprehension Verbalized understanding;Returned demonstration;Need further instruction;Verbal cues required          SLP Short Term Goals - 11/27/15 1328  SLP SHORT TERM GOAL #1   Title pt will maintain loud /a/ average 95dB x 4 sessions   Time 4   Period Weeks  or 4 visits, for all STGs   Status On-going   SLP SHORT TERM GOAL #2   Title pt will demo 18/20 sentences with average 70dB x2 sessions   Time 4   Period Weeks   Status On-going   SLP SHORT TERM GOAL #3   Title pt will tell SLP of reason to perform loud /a/ as directed (at least x5 reps, x2/day)   Time 4   Period Weeks   Status On-going   SLP SHORT TERM GOAL #4   Title pt will demo abdominal breatihng at  rest 75% of the time over 3 minutes   Time 4   Period Weeks   Status On-going          SLP Long Term Goals - 11/26/15 1329    SLP LONG TERM GOAL #1   Title pt will demo loudness average 70dB in 8 minutes simple conversation with rare min A for improving loudness/breath support   Time 8   Period Weeks   Status On-going   SLP LONG TERM GOAL #2   Title pt will demo abdominal breathing for 50% of a simple 8 minute conversation   Time 8   Period Weeks   Status On-going   SLP LONG TERM GOAL #3   Title pt will tell SLP 3 s/s aspiration PNA with modified independence   Time 8   Period Weeks   Status On-going          Plan - 11/26/15 1326    Clinical Impression Statement Pt presents with mild reduced speech loudness due to decr'd breath support. Skilled ST twice a week was recommended, to address breath support and speech loudness, after eval but pt desired once a week.    Speech Therapy Frequency 1x /week   Duration --  8 weeks   Treatment/Interventions Patient/family education;Functional tasks;Compensatory strategies;SLP instruction and feedback;Cueing hierarchy   Potential to Achieve Goals Good   Potential Considerations Cooperation/participation level  pt desire once/week instead of twice/week as recommended        Problem List Patient Active Problem List   Diagnosis Date Noted  . 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 HLD (hyperlipidemia) 06/02/2015  . PD (Parkinson's disease) (HCC) 06/02/2015  . Mitral regurgitation 03/17/2015  . CVA (cerebral infarction)   . TIA (transient ischemic attack) 03/05/2015  . Slurred speech 03/05/2015  . Parkinson's disease (HCC) 08/06/2013  . Dyslipidemia 08/06/2013  . S/P mitral valve repair 08/06/2013    [facility],Tangi Shroff , MS, CCC-SLP 11/27/2015, 1:30 PM  [facility] [address] [place], [place], [address] Phone:  [phone]   Fax:  [phone]   Name: Cherly HensenJohn Detter MRN: [id number] Date of Birth: 10/14/1932

## 2015-12-01 ENCOUNTER — Ambulatory Visit: Payer: Medicare Other | Admitting: Occupational Therapy

## 2015-12-01 ENCOUNTER — Ambulatory Visit: Payer: Medicare Other

## 2015-12-03 ENCOUNTER — Encounter: Payer: Self-pay | Admitting: Internal Medicine

## 2015-12-03 ENCOUNTER — Telehealth: Payer: Self-pay | Admitting: Cardiology

## 2015-12-03 NOTE — Telephone Encounter (Signed)
LMOVM requesting that pt send manual transmission from home monitor.  

## 2015-12-04 NOTE — Telephone Encounter (Signed)
Manual transmission successfully received.  Episodes placed in Dr. Valarie Conesaylor's LINQ folder for review.

## 2015-12-08 ENCOUNTER — Ambulatory Visit (INDEPENDENT_AMBULATORY_CARE_PROVIDER_SITE_OTHER): Payer: Medicare Other | Admitting: *Deleted

## 2015-12-08 DIAGNOSIS — I639 Cerebral infarction, unspecified: Secondary | ICD-10-CM | POA: Diagnosis not present

## 2015-12-09 ENCOUNTER — Ambulatory Visit: Payer: Medicare Other | Admitting: Occupational Therapy

## 2015-12-09 ENCOUNTER — Ambulatory Visit: Payer: Medicare Other

## 2015-12-09 DIAGNOSIS — R279 Unspecified lack of coordination: Secondary | ICD-10-CM

## 2015-12-09 DIAGNOSIS — R471 Dysarthria and anarthria: Secondary | ICD-10-CM

## 2015-12-09 DIAGNOSIS — R4701 Aphasia: Secondary | ICD-10-CM

## 2015-12-09 DIAGNOSIS — R1310 Dysphagia, unspecified: Secondary | ICD-10-CM

## 2015-12-09 DIAGNOSIS — R258 Other abnormal involuntary movements: Secondary | ICD-10-CM

## 2015-12-09 DIAGNOSIS — R29898 Other symptoms and signs involving the musculoskeletal system: Secondary | ICD-10-CM

## 2015-12-09 DIAGNOSIS — R2681 Unsteadiness on feet: Secondary | ICD-10-CM

## 2015-12-09 DIAGNOSIS — R278 Other lack of coordination: Secondary | ICD-10-CM

## 2015-12-09 DIAGNOSIS — R4189 Other symptoms and signs involving cognitive functions and awareness: Secondary | ICD-10-CM

## 2015-12-09 NOTE — Therapy (Signed)
[facility] [address] [place], [place], [address] Phone: [phone]   Fax:  [phone]  Occupational Therapy Treatment  Patient Details  Name: [name] [name] MRN: [id number] Date of Birth: 1932-07-08 Referring Provider: Dr. Jaquita Folds  Encounter Date: 12/09/2015      OT End of Session - 12/09/15 1912    Visit Number 3   Number of Visits 17   Date for OT Re-Evaluation 01/10/16   Authorization Type UHC Medicare, G-code needed, no visit limit, no auth    Authorization - Visit Number 3   Authorization - Number of Visits 10   OT Start Time [phone]   OT Stop Time 0845   OT Time Calculation (min) 39 min   Activity Tolerance Patient tolerated treatment well   Behavior During Therapy [facility] for tasks assessed/performed      Past Medical History  Diagnosis Date  . Parkinson's disease (HCC)   . S/P mitral valve replacement     mitral regurg, endocarditis  . Dyslipidemia   . Stroke [facility])     Past Surgical History  Procedure Laterality Date  . Mitral valve annuloplasty  04/10/2007     26mm Edwards ring; r/t h/o MR and flail mitral leaflets due to endocarditis: MRI safe  . Cataract extraction  2009, 2012    right 2009, left 2012  . Transthoracic echocardiogram  08/15/2012    EF=>55%; normal LV systolic function; RV mildly dilated & systolic function mildly reduced; RA mod dilated; mild -mod MR & mildy increased gradients; mild TR; AV mildly sclerotic with mild-mod regurg  . Loop recorder implant Left 03/13/2015    Procedure: LOOP RECORDER IMPLANT;  Surgeon: Marinus Maw, MD;  Location: [facility] CATH LAB;  Service: Cardiovascular;  Laterality: Left;  . Tee without cardioversion N/A 03/13/2015    Procedure: TRANSESOPHAGEAL ECHOCARDIOGRAM (TEE);  Surgeon: Laurey Morale, MD;  Location: [facility] ENDOSCOPY;  Service: Cardiovascular;  Laterality: N/A;    There were no vitals filed for this visit.  Visit Diagnosis:   Bradykinesia  Rigidity  Decreased coordination  Cognitive deficits  Unsteadiness      Subjective Assessment - 12/09/15 0932    Subjective  Wife reports-- this is so important.  pt reports "you are giving her a lot of power"   Patient is accompained by: Family member   Patient Stated Goals improve ability to turn pages, get things out of wallet/pocket, write pick up pills   Currently in Pain? No/denies                      OT Treatments/Exercises (OP) - 12/09/15 0001    ADLs   UB Dressing Practiced donning/doffing jacket using big amplitude strategies after initial instruction. Practiced simulated clothing adjustment with bag (pulling into palm) using big movments with min cues/difficulty.  Then practiced using big movements to pull down jacket in the back with min v.c.   LB Dressing Practiced simulated donning/doffing pants using bag and big amplitude movement strategies with min v.c.   Functional Mobility Emphasized importance of keeping feet apart for incr wt. shift and to prevent future complications.   ADL Comments Pt/wife instructed in use of big movement strategies for ADLs.   Fine Motor Coordination   Fine Motor Coordination Dealing card with thumb   Dealing card with thumb reviewed using big movements per pt/wife questions with min cueing           [facility]) - 12/09/15 1928  PWR! exercises Moves in sitting   PWR! Up x10 (hands) with min v.c. for big movements   PWR! Up x10   PWR! Rock x10 to each side   PWR! Twist x10 to each side   PWR! Step x10 to each side   Comments in sitting with min v.c. and demo for large movements/sequence.  Emphasized importance of performing PWR! moves regularly at home in addition to coordination and PWR! moves class             OT Education - 12/09/15 1858    Education Details Importance of feedback for movement and that wife will need to cue/give pt feedback at home; Big movement strategies for ADLs    Person(s) Educated Patient   Methods Explanation   Comprehension Verbalized understanding          OT Short Term Goals - 11/11/15 0808    OT SHORT TERM GOAL #1   Title Pt will be independent with PD-specific HEP.--check STGs 12/11/15    Time 4   Period Weeks   Status New   OT SHORT TERM GOAL #2   Title Pt will improve ability to button as shown by buttoning/unbuttoning 3 buttons on table in less than 75sec.   Baseline 88.85sec   Time 4   Period Weeks   Status New   OT SHORT TERM GOAL #3   Title Pt will be able to pull objects out of pocket with only min incr time in 4/5 trials.   Time 4   Period Weeks   Status New   OT SHORT TERM GOAL #4   Title Pt will be able to write name/address with at least 75% legibility.   Time 4   Period Weeks   Status New           OT Long Term Goals - 11/11/15 0454    OT LONG TERM GOAL #1   Title Pt will verbalize understanding of strategies/AE to assist with ADLs/IADLs prn.--check LTGs 01/11/16   Time 8   Period Weeks   Status New   OT LONG TERM GOAL #2   Title Pt will improve ability to button as shown by buttoning/unbuttoning 3 buttons on table in less than 65sec.   Time 8   Period Weeks   Status New   OT LONG TERM GOAL #3   Title Pt will be able to remove cards/money from wallet with min difficulty/incr time in 4/5 trials.   Time 8   Period Weeks   Status New   OT LONG TERM GOAL #4   Title Pt will be able to write name/address with at least 90% legibility.   Time 8   Period Weeks   Status New   OT LONG TERM GOAL #5   Title Pt will improve coordination for ADLs/picking up pills as shown by improving time on 9-hole peg test by at least 3sec bilaterally.   Baseline R-36.55, L-33.31sec   Time 8   Period Weeks   Status New   Long Term Additional Goals   Additional Long Term Goals Yes   OT LONG TERM GOAL #6   Title Pt will demo improved ability to don/doff jacket as shown by improving time on PPT#4 by at least 4sec.   Baseline  33.10sec   Status New               Plan - 12/09/15 1922    Clinical Impression Statement Pt progressing towards goals.  Wife verbalized understanding on  how to assist pt with HEP/big movement strategies at home.   Plan practice buttoning, writing, begin checking STGs next week   Consulted and Agree with Plan of Care Patient;Family member/caregiver        Problem List Patient Active Problem List   Diagnosis Date Noted  . 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 HLD (hyperlipidemia) 06/02/2015  . PD (Parkinson's disease) (HCC) 06/02/2015  . Mitral regurgitation 03/17/2015  . CVA (cerebral infarction)   . TIA (transient ischemic attack) 03/05/2015  . Slurred speech 03/05/2015  . Parkinson's disease (HCC) 08/06/2013  . Dyslipidemia 08/06/2013  . S/P mitral valve repair 08/06/2013    [facility] 12/09/2015, 7:33 PM  [facility] [address] [place], [place], [address] Phone: [phone]   Fax:  [phone]  Name: [name] [name] MRN: [id number] Date of Birth: Aug 06, 1932  Willa Frater, OTR/L 12/09/2015 7:34 PM

## 2015-12-09 NOTE — Progress Notes (Signed)
Carelink Summary Report / Loop Recorder 

## 2015-12-09 NOTE — Therapy (Signed)
[facility] [address] [place], [place], [address] Phone: [phone]   Fax:  [phone]  Speech Language Pathology Treatment  Patient Details  Name: [name] [name] MRN: [id number] Date of Birth: August 07, 1932 Referring Provider: Kathalene Frames  Encounter Date: 12/09/2015      End of Session - 12/09/15 1100    Visit Number 3   Number of Visits 17   Date for SLP Re-Evaluation 01/09/16   SLP Start Time 0848   SLP Stop Time  0930   SLP Time Calculation (min) 42 min   Activity Tolerance Patient tolerated treatment well      Past Medical History  Diagnosis Date  . Parkinson's disease ([place])   . S/P mitral valve replacement     mitral regurg, endocarditis  . Dyslipidemia   . Stroke [facility])     Past Surgical History  Procedure Laterality Date  . Mitral valve annuloplasty  04/10/2007     7m Edwards ring; r/t h/o MR and flail mitral leaflets due to endocarditis: MRI safe  . Cataract extraction  2009, 2012    right 2009, left 2012  . Transthoracic echocardiogram  08/15/2012    EF=>55%; normal LV systolic function; RV mildly dilated & systolic function mildly reduced; RA mod dilated; mild -mod MR & mildy increased gradients; mild TR; AV mildly sclerotic with mild-mod regurg  . Loop recorder implant Left 03/13/2015    Procedure: LOOP RECORDER IMPLANT;  Surgeon: GEvans Lance MD;  [facility];  Service: Cardiovascular;  Laterality: Left;  . Tee without cardioversion N/A 03/13/2015    Procedure: TRANSESOPHAGEAL ECHOCARDIOGRAM (TEE);  Surgeon: DLarey Dresser MD;  [facility]  Service: Cardiovascular;  Laterality: N/A;    There were no vitals filed for this visit.  Visit Diagnosis: Dysarthria  Aphasia  Dysphagia      Subjective Assessment - 12/09/15 0855    Subjective Pt arrives with wife today.   Currently in Pain? Yes   Pain Score 1    Pain Location Shoulder   Pain Orientation Left;Right    Pain Descriptors / Indicators Sore   Pain Type Acute pain   Pain Onset Today   Aggravating Factors  movement/"working it out"   Pain Relieving Factors rest               ADULT SLP TREATMENT - 12/09/15 0856    General Information   Behavior/Cognition Alert;Cooperative;Pleasant mood   Treatment Provided   Treatment provided Cognitive-Linquistic   Cognitive-Linquistic Treatment   Treatment focused on Dysarthria   Skilled Treatment SLP shaped pt's loud /a/ to objectify loudness in conversational speech. With occasional min to mod A pt maintained abdominal breathing (AB) with volume at or above 70dB in simple phrase/sentence tasks. Due to financial concerns at this time, pt would like to discontinue ST services at this time. SLP explained rationale for restarting ST when pt is more comfortable with that option financially.   Assessment / Recommendations / Plan   Plan Continue with current plan of care   Progression Toward Goals   Progression toward goals Progressing toward goals          SLP Education - 12/09/15 1059    Education provided Yes   Education Details loud /a/ and need to keep consistency with practicing it, home tasks   Person(s) Educated Patient;Spouse   Methods Explanation;Demonstration;Verbal cues;Handout   Comprehension Verbalized understanding;Returned demonstration;Verbal cues required          SLP Short  Term Goals - 01-01-2016 1101    SLP SHORT TERM GOAL #1   Title pt will maintain loud /a/ average 95dB x 4 sessions   Time 3   Period Weeks  or 4 visits, for all STGs   Status Not Met   SLP SHORT TERM GOAL #2   Title pt will demo 18/20 sentences with average 70dB x2 sessions   Time 3   Period Weeks   Status Not Met   SLP SHORT TERM GOAL #3   Title pt will tell SLP of reason to perform loud /a/ as directed (at least x5 reps, x2/day)   Time 3   Period Weeks   Status Not Met   SLP SHORT TERM GOAL #4   Title pt will demo abdominal breatihng at rest  75% of the time over 3 minutes   Time 3   Period Weeks   Status Not Met          SLP Long Term Goals - 01-01-16 1102    SLP LONG TERM GOAL #1   Title pt will demo loudness average 70dB in 8 minutes simple conversation with rare min A for improving loudness/breath support   Time 7   Period Weeks   Status Not Met   SLP LONG TERM GOAL #2   Title pt will demo abdominal breathing for 50% of a simple 8 minute conversation   Time 7   Period Weeks   Status Not Met   SLP LONG TERM GOAL #3   Title pt will tell SLP 3 s/s aspiration PNA with modified independence   Time 7   Period Weeks   Status Not Met          Plan - 01-01-16 1100    Clinical Impression Statement Pt would like to d/c ST at this time due to financial reasons. Pt made small gains with structured tasks up to the sentence level.    Treatment/Interventions Patient/family education;Functional tasks;Compensatory strategies;SLP instruction and feedback;Cueing hierarchy   Potential to Achieve Goals Good   Potential Considerations Cooperation/participation level  pt desire once/week instead of twice/week as recommended          G-Codes - 2016/01/01 1102    Functional Assessment Tool Used noms- 6 - 25%   Functional Limitations Motor speech   Motor Speech Goal Status [phone]) At least 1 percent but less than 20 percent impaired, limited or restricted   Motor Speech Goal Status ([id number]) At least 20 percent but less than 40 percent impaired, limited or restricted     [place]  Visits from Start of Care: 3  Current functional level related to goals / functional outcomes: Pt did not meet any STGs or LTGs due to request to d/c from [place] after two ST therapy visits. Pt made some gains in structured speech tasks at the sentence level.     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   Education / Equipment: Loud /a/, home tasks for maintaining louder speech, abdominal breathing.   Plan: Patient agrees to discharge.  Patient goals were not met. Patient is being discharged due to the patient's request.  ?????       Problem List Patient Active Problem List   Diagnosis Date Noted  . Cerebral infarction due to embolism of left middle cerebral artery ([place]) 06/02/2015  . HLD (hyperlipidemia) 06/02/2015  . PD (Parkinson's disease) ([place]) 06/02/2015  . Mitral regurgitation 03/17/2015  . CVA (cerebral infarction)   . TIA (transient ischemic  attack) 03/05/2015  . Slurred speech 03/05/2015  . Parkinson's disease ([place]) 08/06/2013  . Dyslipidemia 08/06/2013  . S/P mitral valve repair 08/06/2013    [facility] , MS, CCC-SLP 12/09/2015, 11:30 AM  [facility] [address] [place], [place], [address] Phone: [phone]   Fax:  [phone]   Name: Malaquias Lenker MRN: [id number] Date of Birth: 05-31-1932

## 2015-12-09 NOTE — Patient Instructions (Signed)

## 2015-12-09 NOTE — Patient Instructions (Signed)
Continue to practice your loud /a/, five times, twice a day.  Complete speech tasks for 10-15 minutes, twice per day.  We would love to see you back when you're more comfortable with financial aspect of therapy.

## 2015-12-12 ENCOUNTER — Encounter: Payer: Self-pay | Admitting: *Deleted

## 2015-12-12 ENCOUNTER — Other Ambulatory Visit: Payer: Self-pay | Admitting: *Deleted

## 2015-12-15 ENCOUNTER — Ambulatory Visit: Payer: Medicare Other | Admitting: Occupational Therapy

## 2015-12-15 DIAGNOSIS — R29898 Other symptoms and signs involving the musculoskeletal system: Secondary | ICD-10-CM

## 2015-12-15 DIAGNOSIS — R279 Unspecified lack of coordination: Secondary | ICD-10-CM

## 2015-12-15 DIAGNOSIS — R4189 Other symptoms and signs involving cognitive functions and awareness: Secondary | ICD-10-CM

## 2015-12-15 DIAGNOSIS — R258 Other abnormal involuntary movements: Secondary | ICD-10-CM

## 2015-12-15 DIAGNOSIS — R471 Dysarthria and anarthria: Secondary | ICD-10-CM | POA: Diagnosis not present

## 2015-12-15 DIAGNOSIS — R278 Other lack of coordination: Secondary | ICD-10-CM

## 2015-12-15 NOTE — Therapy (Signed)
[facility] [address] [place], [place], [address] Phone: [phone]   Fax:  [phone]  Occupational Therapy Treatment  Patient Details  Name: [name] [name] MRN: [id number] Date of Birth: Dec 11, 1931 Referring Provider: Dr. Jaquita Folds  Encounter Date: 12/15/2015      OT End of Session - 12/15/15 0815    Visit Number 4   Number of Visits 17   Date for OT Re-Evaluation 01/10/16   Authorization Type UHC Medicare, G-code needed, no visit limit, no auth    Authorization - Visit Number 4   Authorization - Number of Visits 10   OT Start Time 0805   OT Stop Time 0845   OT Time Calculation (min) 40 min   Activity Tolerance Patient tolerated treatment well   Behavior During Therapy [facility] for tasks assessed/performed      Past Medical History  Diagnosis Date  . Parkinson's disease (HCC)   . S/P mitral valve replacement     mitral regurg, endocarditis  . Dyslipidemia   . Stroke [facility])     Past Surgical History  Procedure Laterality Date  . Mitral valve annuloplasty  04/10/2007     26mm Edwards ring; r/t h/o MR and flail mitral leaflets due to endocarditis: MRI safe  . Cataract extraction  2009, 2012    right 2009, left 2012  . Transthoracic echocardiogram  08/15/2012    EF=>55%; normal LV systolic function; RV mildly dilated & systolic function mildly reduced; RA mod dilated; mild -mod MR & mildy increased gradients; mild TR; AV mildly sclerotic with mild-mod regurg  . Loop recorder implant Left 03/13/2015    Procedure: LOOP RECORDER IMPLANT;  Surgeon: Marinus Maw, MD;  [facility] CATH LAB;  Service: Cardiovascular;  Laterality: Left;  . Tee without cardioversion N/A 03/13/2015    Procedure: TRANSESOPHAGEAL ECHOCARDIOGRAM (TEE);  Surgeon: Laurey Morale, MD;  Location: [facility] ENDOSCOPY;  Service: Cardiovascular;  Laterality: N/A;    There were no vitals filed for this visit.  Visit Diagnosis:   Bradykinesia  Rigidity  Decreased coordination  Cognitive deficits      Subjective Assessment - 12/15/15 0809    Subjective  "My writing gets smaller"   Patient is accompained by: Family member   Patient Stated Goals improve ability to turn pages, get things out of wallet/pocket, write pick up pills   Currently in Pain? No/denies                      OT Treatments/Exercises (OP) - 12/15/15 0001    ADLs   UB Dressing Practiced buttoning/unbuttoning shirt on tabletop.  Pt with mod difficulty, but visual perceptual difficulties appeared to contribute.  Educated pt/wife of visual perceptual changes in PD (and recommended pt/wife try to incr contrast of objects/background when possible).  Trial of PWR! hands with minimal improvement, and then trial of button hook with significantly incr ease.  Pt/wife instructed in use and where to purchase.  Pt/wife verbalized understanding.   Writing Pt instructed in PD writing tips.   Pt able to return demo with improved legibility and writing size with use of strategies.  Recommended pt practice writing in variety of contexts including with divided attention to improve carryover and educated pt/wife in how to do so.  Pt/wife verbalized understanding.   Fine Motor Coordination   Fine Motor Coordination Dealing card with thumb   Dealing card with thumb reviewed using big movements per pt/wife questions with min-mod cueing for R  hand                OT Education - 12/15/15 1636    Education Details PD writing strategies/tips, use of button hook and PWR! hands   Person(s) Educated Patient   Methods Explanation;Demonstration;Verbal cues;Handout   Comprehension Verbalized understanding;Returned demonstration;Verbal cues required          OT Short Term Goals - 12/15/15 0829    OT SHORT TERM GOAL #1   Title Pt will be independent with PD-specific HEP.--check STGs 12/11/15    Time 4   Period Weeks   Status On-going   OT SHORT  TERM GOAL #2   Title Pt will improve ability to button as shown by buttoning/unbuttoning 3 buttons on table in less than 75sec.   Baseline 88.85sec   Time 4   Period Weeks   Status New   OT SHORT TERM GOAL #3   Title Pt will be able to pull objects out of pocket with only min incr time in 4/5 trials.   Time 4   Period Weeks   Status New   OT SHORT TERM GOAL #4   Title Pt will be able to write name/address with at least 75% legibility.   Time 4   Period Weeks   Status On-going           OT Long Term Goals - 11/11/15 1610    OT LONG TERM GOAL #1   Title Pt will verbalize understanding of strategies/AE to assist with ADLs/IADLs prn.--check LTGs 01/11/16   Time 8   Period Weeks   Status New   OT LONG TERM GOAL #2   Title Pt will improve ability to button as shown by buttoning/unbuttoning 3 buttons on table in less than 65sec.   Time 8   Period Weeks   Status New   OT LONG TERM GOAL #3   Title Pt will be able to remove cards/money from wallet with min difficulty/incr time in 4/5 trials.   Time 8   Period Weeks   Status New   OT LONG TERM GOAL #4   Title Pt will be able to write name/address with at least 90% legibility.   Time 8   Period Weeks   Status New   OT LONG TERM GOAL #5   Title Pt will improve coordination for ADLs/picking up pills as shown by improving time on 9-hole peg test by at least 3sec bilaterally.   Baseline R-36.55, L-33.31sec   Time 8   Period Weeks   Status New   Long Term Additional Goals   Additional Long Term Goals Yes   OT LONG TERM GOAL #6   Title Pt will demo improved ability to don/doff jacket as shown by improving time on PPT#4 by at least 4sec.   Baseline 33.10sec   Status New               Plan - 12/15/15 9604    Clinical Impression Statement Pt  is progressing towards goals.  Pt demo improved movement quality and success for ADL tasks with use of big amplitude movements.   Plan review HEP, check remaining STGs   OT Home  Exercise Plan Education provided:  coordination HEP, reviewed PWR! moves in sitting (basic 4)   Consulted and Agree with Plan of Care Patient;Family member/caregiver   Family Member Consulted wife        Problem List Patient Active Problem List   Diagnosis Date Noted  . Cerebral infarction due  to embolism of left middle cerebral artery (HCC) 06/02/2015  . HLD (hyperlipidemia) 06/02/2015  . PD (Parkinson's disease) (HCC) 06/02/2015  . Mitral regurgitation 03/17/2015  . CVA (cerebral infarction)   . TIA (transient ischemic attack) 03/05/2015  . Slurred speech 03/05/2015  . Anxiety 12/19/2013  . Parkinson's disease (HCC) 08/06/2013  . Dyslipidemia 08/06/2013  . S/P mitral valve repair 08/06/2013    [facility] 12/15/2015, 4:43 PM  [facility] [address] [place], [place], [address] Phone: [phone]   Fax:  [phone]  Name: [name] [name] MRN: [id number] Date of Birth: 12/31/1931  Willa Frater, OTR/L 12/15/2015 4:43 PM

## 2015-12-22 ENCOUNTER — Ambulatory Visit: Payer: Medicare Other | Admitting: Occupational Therapy

## 2015-12-22 DIAGNOSIS — R279 Unspecified lack of coordination: Secondary | ICD-10-CM

## 2015-12-22 DIAGNOSIS — R278 Other lack of coordination: Secondary | ICD-10-CM

## 2015-12-22 DIAGNOSIS — R258 Other abnormal involuntary movements: Secondary | ICD-10-CM

## 2015-12-22 DIAGNOSIS — R29898 Other symptoms and signs involving the musculoskeletal system: Secondary | ICD-10-CM

## 2015-12-22 DIAGNOSIS — R4189 Other symptoms and signs involving cognitive functions and awareness: Secondary | ICD-10-CM

## 2015-12-22 DIAGNOSIS — R471 Dysarthria and anarthria: Secondary | ICD-10-CM | POA: Diagnosis not present

## 2015-12-22 NOTE — Therapy (Signed)
[facility] [address] [place], [place], [address] Phone: [phone]   Fax:  [phone]  Occupational Therapy Treatment  Patient Details  Name: [name] [name] MRN: [id number] Date of Birth: [birth year] Referring Provider: Dr. Eldridge Abrahams  Encounter Date: 12/22/2015      OT End of Session - 12/22/15 0842    Visit Number 5   Number of Visits 17   Date for OT Re-Evaluation 01/10/16   Authorization Type UHC Medicare, G-code needed, no visit limit, no auth    Authorization - Visit Number 5   Authorization - Number of Visits 10   OT Start Time [phone]   OT Stop Time 0845   OT Time Calculation (min) 39 min   Activity Tolerance Patient tolerated treatment well   Behavior During Therapy [facility] for tasks assessed/performed      Past Medical History  Diagnosis Date  . Parkinson's disease ([place])   . S/P mitral valve replacement     mitral regurg, endocarditis  . Dyslipidemia   . Stroke [facility])     Past Surgical History  Procedure Laterality Date  . Mitral valve annuloplasty  04/10/2007     43m Edwards ring; r/t h/o MR and flail mitral leaflets due to endocarditis: MRI safe  . Cataract extraction  2009, 2012    right 2009, left 2012  . Transthoracic echocardiogram  08/15/2012    EF=>55%; normal LV systolic function; RV mildly dilated & systolic function mildly reduced; RA mod dilated; mild -mod MR & mildy increased gradients; mild TR; AV mildly sclerotic with mild-mod regurg  . Loop recorder implant Left 03/13/2015    Procedure: LOOP RECORDER IMPLANT;  Surgeon: GEvans Lance MD;  [facility];  Service: Cardiovascular;  Laterality: Left;  . Tee without cardioversion N/A 03/13/2015    Procedure: TRANSESOPHAGEAL ECHOCARDIOGRAM (TEE);  Surgeon: DLarey Dresser MD;  [place]  Service: Cardiovascular;  Laterality: N/A;    There were no vitals filed for this visit.  Visit Diagnosis:   Bradykinesia  Rigidity  Decreased coordination  Cognitive deficits      Subjective Assessment - 12/22/15 0822    Subjective  "I get tired at home"  Pt reports slip in shower and grabbed hold of something with LUE to catch himself (with possible injury contributing to pain)   Patient is accompained by: Family member   Patient Stated Goals improve ability to turn pages, get things out of wallet/pocket, write pick up pills   Currently in Pain? Yes   Pain Score 7   0-7/10   Pain Location --  shoulder, upper arm   Pain Orientation Left   Pain Descriptors / Indicators Sharp;Sore;Tightness   Pain Type Acute pain   Pain Onset 1 to 4 weeks ago   Pain Frequency Intermittent   Aggravating Factors  too much activity    Pain Relieving Factors rest                      OT Treatments/Exercises (OP) - 12/22/15 0001    ADLs   Overall ADLs Checked STGs and discussed progress--see goal sections.  Recommended pt break up exercises and do multiple short sessions of HEP versus 1 longer session to decr frustration and encourage max effort with each rep.  Pt/wife verbalized understanding.   UB Dressing Donned jacket with no difficulty today using strategy.   Writing Wrote name and address with 100% legibility  OT Education - 12/22/15 [phone]    Education Details Reviewed coordination HEP    Person(s) Educated Patient;Spouse   Methods Explanation;Demonstration;Verbal cues;Tactile cues   Comprehension Verbalized understanding;Returned demonstration;Verbal cues required  min-mod cues          OT Short Term Goals - 12/22/15 0949    OT SHORT TERM GOAL #1   Title Pt will be independent with PD-specific HEP.--check STGs 12/11/15    Time 4   Period Weeks   Status Achieved  12/22/15  needs min-mod cues but wife able to provide.   OT SHORT TERM GOAL #2   Title Pt will improve ability to button as shown by buttoning/unbuttoning 3 buttons on table in less than  75sec.   Baseline 88.85sec   Time 4   Period Weeks   Status Achieved  12/22/15:  60.22sec with button hook   OT SHORT TERM GOAL #3   Title Pt will be able to pull objects out of pocket with only min incr time in 4/5 trials.   Time 4   Period Weeks   Status On-going   OT SHORT TERM GOAL #4   Title Pt will be able to write name/address with at least 75% legibility.   Time 4   Period Weeks   Status Achieved  12/22/15 100%           OT Long Term Goals - 12/22/15 0958    OT LONG TERM GOAL #1   Title Pt will verbalize understanding of strategies/AE to assist with ADLs/IADLs prn.--check LTGs 01/11/16   Time 8   Period Weeks   Status On-going   OT LONG TERM GOAL #2   Title Pt will improve ability to button as shown by buttoning/unbuttoning 3 buttons on table in less than 65sec.   Time 8   Period Weeks   Status Achieved  12/22/15  60.22sec with button hook   OT LONG TERM GOAL #3   Title Pt will be able to remove cards/money from wallet with min difficulty/incr time in 4/5 trials.   Time 8   Period Weeks   Status New   OT LONG TERM GOAL #4   Title Pt will be able to write name/address with at least 90% legibility.   Time 8   Period Weeks   Status Achieved  12/22/15  100%   OT LONG TERM GOAL #5   Title Pt will improve coordination for ADLs/picking up pills as shown by improving time on 9-hole peg test by at least 3sec bilaterally.   Baseline R-36.55, L-33.31sec   Time 8   Period Weeks   Status New   OT LONG TERM GOAL #6   Title Pt will demo improved ability to don/doff jacket as shown by improving time on PPT#4 by at least 4sec.   Baseline 33.10sec   Status New               Plan - 12/22/15 0930    Clinical Impression Statement Pt progressing towards goals.  Pt needs cueing for HEP but wife has been instructed in how to provide cues.  3/4 STGs met.  Pt will be out of town next week on vacation.   Plan practice getting items out of pockets (out of town next week on  vacation)   Consulted and Agree with Plan of Care Patient;Family member/caregiver   Family Member Consulted wife        Problem List Patient Active Problem List   Diagnosis Date Noted  .  Cerebral infarction due to embolism of left middle cerebral artery ([place]) 06/02/2015  . HLD (hyperlipidemia) 06/02/2015  . PD (Parkinson's disease) ([place]) 06/02/2015  . Mitral regurgitation 03/17/2015  . CVA (cerebral infarction)   . TIA (transient ischemic attack) 03/05/2015  . Slurred speech 03/05/2015  . Anxiety 12/19/2013  . Parkinson's disease ([place]) 08/06/2013  . Dyslipidemia 08/06/2013  . S/P mitral valve repair 08/06/2013    [facility] 12/22/2015, 10:01 AM  [facility] [address] [place], [place], [address] Phone: [phone]   Fax:  [phone]  Name: Zacchaeus Halm MRN: [id number] Date of Birth: 18-Mar-1932  Vianne Bulls, OTR/L 12/22/2015 10:01 AM

## 2015-12-24 ENCOUNTER — Telehealth: Payer: Self-pay | Admitting: *Deleted

## 2015-12-24 ENCOUNTER — Encounter: Payer: Self-pay | Admitting: *Deleted

## 2015-12-24 DIAGNOSIS — I480 Paroxysmal atrial fibrillation: Secondary | ICD-10-CM | POA: Insufficient documentation

## 2015-12-24 NOTE — Telephone Encounter (Signed)
LMOM and VM requesting call back.  Gave device clinic phone number.  Will advise patient that per Dr. Ladona Ridgel, he should discuss anticoagulation at appointment with Dr. Shirlee Latch tomorrow at his appointment.

## 2015-12-24 NOTE — Telephone Encounter (Signed)
Returned patient's wife's call.  Advised her that Dr. Ladona Ridgel recommended that patient discuss anticoagulation with Dr. Shirlee Latch at his appointment tomorrow.  She verbalizes understanding of information and denies additional questions or concerns at this time.  She verbalizes appreciation of call.

## 2015-12-24 NOTE — Telephone Encounter (Signed)
Pt wife called back please call her at [phone].

## 2015-12-24 NOTE — Telephone Encounter (Signed)
Called patient regarding atrial fibrillation episodes on [facility] transmissions.  Per Dr. Ladona Ridgel, these episodes are true atrial fibrillation and patient should have an office visit with him to discuss anticoagulation.  Discussed findings with patient.  He wonders if he could discuss this with Dr. Shirlee Latch at his appointment tomorrow, 12/25/15, rather than making an appointment to see Dr. Ladona Ridgel.  Advised patient that I would ask Dr. Ladona Ridgel and call him back.  He is agreeable to this plan.

## 2015-12-25 ENCOUNTER — Encounter: Payer: Self-pay | Admitting: Cardiology

## 2015-12-25 ENCOUNTER — Ambulatory Visit (INDEPENDENT_AMBULATORY_CARE_PROVIDER_SITE_OTHER): Payer: Medicare Other | Admitting: Cardiology

## 2015-12-25 VITALS — BP 118/78 | HR 69 | Ht 67.0 in | Wt 187.8 lb

## 2015-12-25 DIAGNOSIS — I480 Paroxysmal atrial fibrillation: Secondary | ICD-10-CM

## 2015-12-25 DIAGNOSIS — Z9889 Other specified postprocedural states: Secondary | ICD-10-CM

## 2015-12-25 DIAGNOSIS — I340 Nonrheumatic mitral (valve) insufficiency: Secondary | ICD-10-CM | POA: Diagnosis not present

## 2015-12-25 LAB — CBC WITH DIFFERENTIAL/PLATELET
BASOS PCT: 0 % (ref 0–1)
Basophils Absolute: 0 10*3/uL (ref 0.0–0.1)
EOS PCT: 2 % (ref 0–5)
Eosinophils Absolute: 0.1 10*3/uL (ref 0.0–0.7)
HEMATOCRIT: 44.1 % (ref 39.0–52.0)
Hemoglobin: 14.6 g/dL (ref 13.0–17.0)
LYMPHS PCT: 22 % (ref 12–46)
Lymphs Abs: 1.4 10*3/uL (ref 0.7–4.0)
MCH: 30.7 pg (ref 26.0–34.0)
MCHC: 33.1 g/dL (ref 30.0–36.0)
MCV: 92.8 fL (ref 78.0–100.0)
MONO ABS: 0.5 10*3/uL (ref 0.1–1.0)
MONOS PCT: 8 % (ref 3–12)
MPV: 9.3 fL (ref 8.6–12.4)
Neutro Abs: 4.3 10*3/uL (ref 1.7–7.7)
Neutrophils Relative %: 68 % (ref 43–77)
Platelets: 178 10*3/uL (ref 150–400)
RBC: 4.75 MIL/uL (ref 4.22–5.81)
RDW: 14 % (ref 11.5–15.5)
WBC: 6.3 10*3/uL (ref 4.0–10.5)

## 2015-12-25 LAB — BASIC METABOLIC PANEL
BUN: 20 mg/dL (ref 7–25)
CALCIUM: 9.1 mg/dL (ref 8.6–10.3)
CO2: 27 mmol/L (ref 20–31)
Chloride: 102 mmol/L (ref 98–110)
Creat: 1.16 mg/dL — ABNORMAL HIGH (ref 0.70–1.11)
GLUCOSE: 100 mg/dL — AB (ref 65–99)
Potassium: 4.4 mmol/L (ref 3.5–5.3)
Sodium: 137 mmol/L (ref 135–146)

## 2015-12-25 MED ORDER — WARFARIN SODIUM 5 MG PO TABS: 5.0000 mg | ORAL_TABLET | Freq: Every day | ORAL | Status: DC

## 2015-12-25 MED ORDER — WARFARIN SODIUM 5 MG PO TABS
5.0000 mg | ORAL_TABLET | Freq: Every day | ORAL | Status: DC
Start: 2015-12-25 — End: 2016-01-26

## 2015-12-25 MED ORDER — DILTIAZEM HCL ER COATED BEADS 120 MG PO CP24: 120.0000 mg | ORAL_CAPSULE | Freq: Every day | ORAL | Status: DC

## 2015-12-25 NOTE — Progress Notes (Signed)
Patient ID: [name] [name], male   DOB: 10/22/1932, 82 y.o.   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  Patient had a CVA in 4/16 with right facial droop and expressive aphasia.  This resolved completely.  Echo during that admission showed abnormality of the repaired mitral valve so TEE was done.  TEE in 4/16 showed suspected loose suture material with a small area of annuloplasty ring dehiscence leading to moderate mitral regurgitation.  He had implanted loop recorder placed.   Repeat echo in 10/16 appeared to show mild MR with EF 55-60%.  He was noted by his loop recorder to have runs of atrial fibrillation with RVR, rate up to 150 bpm range.  He has felt mild palpitations, not particularly symptomatic. He has some generalized fatigue but walks his dog daily and is able to go up a gentle hill or flight of steps without significant dyspnea.  No chest pain.  Generally feels good.  He drinks 1 ETOH drink/day (no more).  His wife says that he does not snore or gasp in his sleep.    ECG: NSR with PVCs  Labs (4/16): LDL 93, K 4.3, creatinine 1.32 Labs (8/16): LDL 77, HDL 50, creatinine 1.07  PMH: 1. Mitral regurgitation: MV repair in 2008 with flail P2 segment in setting of MV endocarditis.  LA appendage was oversewn.  Echo (4/16) with EF 55-60%, mild AI, MV repair with mean gradient 3 mmHg, moderate MR, RV moderately dilated with normal systolic function.  TEE (4/16) with EF 55%, s/p MV repair with suspected loose suture material and small area of annuloplasty ring dehiscence causing moderate MR, LAA oversewn, mildly dilated RV with normal systolic function.  Echo (10/16) with EF 55-60%, moderate LVH, s/p MV repair with only mild MR visualized on this study, mild RV dilation with normal function, LAE, PASP 30 mmHg.  2. Parkinsons disease: Followed at [facility].  3. CVA: 4/16 with  right facial droop and expressive aphasia.  This resolved completely.  Carotid dopplers 4/16 showed mild plaque only.  He has ILR.  4. LHC (5/08) with no CAD.  5. Hyperlipidemia  SH: Nonsmoker, married, retired from [employer].   FH: Mother MI and CVA, valvular heart disease in brother.   ROS: All systems reviewed and negative except as per HPI.   Current Outpatient Prescriptions  Medication Sig Dispense Refill  . acetaminophen (TYLENOL) 325 MG tablet Take 325 mg by mouth every 6 (six) hours as needed for headache.     Marland Kitchen amantadine (SYMMETREL) 100 MG capsule Take 100 mg by mouth 2 (two) times daily.    Marland Kitchen amoxicillin (AMOXIL) 500 MG capsule Take 500 mg by mouth See admin instructions. Pt only takes  prior to dental appt    . atorvastatin (LIPITOR) 20 MG tablet Take 20 mg by mouth daily at 6 PM.     . cholecalciferol (VITAMIN D) 1000 UNITS tablet Take 1,000 Units by mouth daily.    Tery Sanfilippo Calcium (STOOL SOFTENER PO) Take 1 capsule by mouth as directed.     . donepezil (ARICEPT) 10 MG tablet Take 1 tablet by mouth at bedtime.    Marland Kitchen escitalopram (LEXAPRO) 10 MG tablet Take 10 mg by mouth daily.    Marland Kitchen ibandronate (BONIVA) 150 MG tablet Take 1 tablet by mouth every 30 (thirty) days.    . Multiple Vitamins-Minerals (MULTIVITAMIN PO)  Take 1 tablet by mouth daily.     . rasagiline (AZILECT) 1 MG TABS tablet Take 1 mg by mouth every morning. .    . rotigotine (NEUPRO) 4 MG/24HR Place 1 patch onto the skin daily.    Marland Kitchen RYTARY 48.75-195 MG CPCR Take 9 capsules by mouth See admin instructions. 3 caps in the morning with Azelect, 3 caps at 1pm with Amantadine, 2 caps at 6pm with Amantadine and 1 cap at bedtime.    . vitamin B-12 (CYANOCOBALAMIN) 1000 MCG tablet Take 1,000 mcg by mouth every other day.     . vitamin E 400 UNIT capsule Take 400 Units by mouth daily.    Marland Kitchen diltiazem (CARDIZEM CD) 120 MG 24 hr capsule Take 1 capsule (120 mg total) by mouth daily. 90 capsule 1  . warfarin  (COUMADIN) 5 MG tablet Take 1 tablet (5 mg total) by mouth daily. 30 tablet 0   No current facility-administered medications for this visit.   BP 118/78 mmHg  Pulse 69  Ht  (1.702 m)  Wt 187 lb 12.8 oz (85.186 kg)  BMI 29.41 kg/m2 General: NAD Neck: No JVD, no thyromegaly or thyroid nodule.  Lungs: Clear to auscultation bilaterally with normal respiratory effort. CV: Nondisplaced PMI.  Heart regular S1/S2, no S3/S4, 3/6 HSM at apex.  No peripheral edema.  No carotid bruit.  Normal pedal pulses.  Abdomen: Soft, nontender, no hepatosplenomegaly, no distention.  Skin: Intact without lesions or rashes.  Neurologic: Alert and oriented x 3.  Psych: Normal affect. Extremities: No clubbing or cyanosis.  HEENT: Normal.   Assessment/Plan: 1. Mitral regurgitation: S/p MV repair in 2008.  Now with loose suture material and dehiscence of a small area of the annuloplasty ring leading to moderate MR by TEE.  Repeat echo in 10/16 did not visualize more than mild MR but murmur remains loud.  He is not volume overloaded on exam and has minimal exertional dyspnea.   - Follow MV closely over time.  I will repeat echo again in 1 year as long as he remains symptomatically stable.  - Will need endocarditis prophylaxis with dental work.  2. CVA: I suspect that this was atrial fibrillation-related.  3. Hyperlipidemia: Good lipids 8/16. 4. Atrial fibrillation: Paroxysmal, identified by ILR. He has had several runs of atrial fibrillation with RVR.  He is only mildly symptomatic.  - Add diltiazem CD 120 mg daily (will use this rather than beta blocker given baseline fatigue and Parkinsons).  - CHADSVASC = 4, start coumadin (valvular atrial fibrillation).  He will stop ASA when on coumadin.    Marca Ancona 12/25/2015

## 2015-12-25 NOTE — Patient Instructions (Addendum)
Medication Instructions:  Stop aspirin on Tuesday Feb 7,2017 On Wednesday Feb 8,2017 start  coumadin (warfarin)  daily--take this daily in the evening, you can start tonight.  Start Diltiazem CD  daily  Labwork: BMET/CBCd today  Your physician recommends that you return for lab work in: 3 months --CBCd   Testing/Procedures: none  Follow-Up:  You have been referred to the Coumadin Clinic for management of your coumadin (warfarin). Please schedule a new patient appointment in the Coumadin Clinic February 13 ,2017.  Your physician wants you to follow-up in: 6 months with Dr Shirlee Latch. (August 2017). You will receive a reminder letter in the mail two months in advance. If you don't receive a letter, please call our office to schedule the follow-up appointment.        If you need a refill on your cardiac medications before your next appointment, please call your pharmacy.

## 2015-12-26 LAB — CUP PACEART REMOTE DEVICE CHECK: MDC IDC SESS DTM: 20161217160633

## 2015-12-29 ENCOUNTER — Encounter: Payer: Medicare Other | Admitting: Occupational Therapy

## 2016-01-05 ENCOUNTER — Ambulatory Visit (INDEPENDENT_AMBULATORY_CARE_PROVIDER_SITE_OTHER): Payer: Medicare Other | Admitting: *Deleted

## 2016-01-05 DIAGNOSIS — Z5181 Encounter for therapeutic drug level monitoring: Secondary | ICD-10-CM | POA: Diagnosis not present

## 2016-01-05 DIAGNOSIS — I480 Paroxysmal atrial fibrillation: Secondary | ICD-10-CM

## 2016-01-05 DIAGNOSIS — I63412 Cerebral infarction due to embolism of left middle cerebral artery: Secondary | ICD-10-CM

## 2016-01-05 DIAGNOSIS — G458 Other transient cerebral ischemic attacks and related syndromes: Secondary | ICD-10-CM

## 2016-01-05 DIAGNOSIS — Z9889 Other specified postprocedural states: Secondary | ICD-10-CM

## 2016-01-05 LAB — POCT INR: INR: 3.1

## 2016-01-05 NOTE — Patient Instructions (Signed)

## 2016-01-06 ENCOUNTER — Encounter: Payer: Medicare Other | Admitting: Speech Pathology

## 2016-01-06 ENCOUNTER — Ambulatory Visit: Payer: Medicare Other | Attending: Nurse Practitioner | Admitting: Occupational Therapy

## 2016-01-06 DIAGNOSIS — R258 Other abnormal involuntary movements: Secondary | ICD-10-CM | POA: Diagnosis present

## 2016-01-06 DIAGNOSIS — R29898 Other symptoms and signs involving the musculoskeletal system: Secondary | ICD-10-CM | POA: Diagnosis present

## 2016-01-06 DIAGNOSIS — R279 Unspecified lack of coordination: Secondary | ICD-10-CM | POA: Insufficient documentation

## 2016-01-06 DIAGNOSIS — R4189 Other symptoms and signs involving cognitive functions and awareness: Secondary | ICD-10-CM | POA: Insufficient documentation

## 2016-01-06 DIAGNOSIS — R6889 Other general symptoms and signs: Secondary | ICD-10-CM | POA: Diagnosis present

## 2016-01-06 DIAGNOSIS — R278 Other lack of coordination: Secondary | ICD-10-CM

## 2016-01-06 DIAGNOSIS — R293 Abnormal posture: Secondary | ICD-10-CM | POA: Diagnosis present

## 2016-01-06 DIAGNOSIS — R2681 Unsteadiness on feet: Secondary | ICD-10-CM | POA: Diagnosis present

## 2016-01-06 NOTE — Therapy (Signed)
[facility] [address] [place], [place], [address] Phone: [phone]   Fax:  [phone]  Occupational Therapy Treatment  Patient Details  Name: [name] [name] MRN: [id number] Date of Birth: December 01, 1931 Referring Provider: Dr. Jaquita Folds  Encounter Date: 01/06/2016      OT End of Session - 01/06/16 0810    Visit Number 6   Number of Visits 17   Date for OT Re-Evaluation 01/10/16   Authorization Type UHC Medicare, G-code needed, no visit limit, no auth    Authorization - Visit Number 6   Authorization - Number of Visits 10   OT Start Time 0808   OT Stop Time 0846   OT Time Calculation (min) 38 min   Activity Tolerance Patient tolerated treatment well   Behavior During Therapy [facility] for tasks assessed/performed      Past Medical History  Diagnosis Date  . Parkinson's disease ([facility])   . S/P mitral valve replacement     mitral regurg, endocarditis  . Dyslipidemia   . Stroke [facility])     Past Surgical History  Procedure Laterality Date  . Mitral valve annuloplasty  04/10/2007     26mm Edwards ring; r/t h/o MR and flail mitral leaflets due to endocarditis: MRI safe  . Cataract extraction  2009, 2012    right 2009, left 2012  . Transthoracic echocardiogram  08/15/2012    EF=>55%; normal LV systolic function; RV mildly dilated & systolic function mildly reduced; RA mod dilated; mild -mod MR & mildy increased gradients; mild TR; AV mildly sclerotic with mild-mod regurg  . Loop recorder implant Left 03/13/2015    Procedure: LOOP RECORDER IMPLANT;  Surgeon: Marinus Maw, MD;  Location: [facility] CATH LAB;  Service: Cardiovascular;  Laterality: Left;  . Tee without cardioversion N/A 03/13/2015    Procedure: TRANSESOPHAGEAL ECHOCARDIOGRAM (TEE);  Surgeon: Laurey Morale, MD;  Location: [facility] ENDOSCOPY;  Service: Cardiovascular;  Laterality: N/A;    There were no vitals filed for this visit.  Visit Diagnosis:   Bradykinesia  Rigidity  Decreased coordination      Subjective Assessment - 01/06/16 0825    Subjective  Pt reports that he is doing better getting things out of pocket/wallet.  "I was able to swim some, I haven't been able to swim in  a couple years"   Patient is accompained by: Family member   Patient Stated Goals improve ability to turn pages, get things out of wallet/pocket, write pick up pills   Currently in Pain? No/denies                      OT Treatments/Exercises (OP) - 01/06/16 0001    ADLs   Overall ADLs Practiced removing coings, keys, wallet from pocket.  Then practiced removing cards from wallet and manipulating keys in hand.  Pt with ocassional min difficulty and incr time.  Pt instructed to use PWR! hands with these tasks and returned demo.   Medication Management Simulated Medication Management:  Removing "pills" (small pegs) from medication bottles and placing in weekly medication organizer for incr coordination with min cues to use PWR! hands with difficulty (min).  Pt opened containers using normal amplitude movments and completed task with min incr time/difficulty.   ADL Comments Began checking goals and discussing progress.  Anticipate d/c next visit.  Recommended OT, PT, ST screens in approx 6months and discussed progress.  Pt/wife verbalized agreement.   Fine Motor Coordination   Fine Motor Coordination  Grooved pegs;Small Pegboard   Small Pegboard removing pegs from container and placing in pegboard with min incr time/difficulty with each hand.  Min cues for use of PWR! hand technique   Grooved pegs Placing grooved pegs in pegboard with each hand with min difficulty/incr time   Neurological Re-education Exercises   Other Exercises 1 Tossing scarves with ea. UE in standing with use of PWR! movements (incorporating, wt. shift, trunk rotation) and emphasis on large amplitude movement strategies.                  OT Short Term Goals -  01/06/16 0818    OT SHORT TERM GOAL #1   Title Pt will be independent with PD-specific HEP.--check STGs 12/11/15    Time 4   Period Weeks   Status Achieved  12/22/15  needs min-mod cues but wife able to provide.   OT SHORT TERM GOAL #2   Title Pt will improve ability to button as shown by buttoning/unbuttoning 3 buttons on table in less than 75sec.   Baseline 88.85sec   Time 4   Period Weeks   Status Achieved  12/22/15:  60.22sec with button hook   OT SHORT TERM GOAL #3   Title Pt will be able to pull objects out of pocket with only min incr time in 4/5 trials.   Time 4   Period Weeks   Status Achieved  01/06/16   OT SHORT TERM GOAL #4   Title Pt will be able to write name/address with at least 75% legibility.   Time 4   Period Weeks   Status Achieved  12/22/15 100%           OT Long Term Goals - 01/06/16 0819    OT LONG TERM GOAL #1   Title Pt will verbalize understanding of strategies/AE to assist with ADLs/IADLs prn.--check LTGs 01/11/16   Time 8   Period Weeks   Status On-going   OT LONG TERM GOAL #2   Title Pt will improve ability to button as shown by buttoning/unbuttoning 3 buttons on table in less than 65sec.   Time 8   Period Weeks   Status Achieved  12/22/15  60.22sec with button hook   OT LONG TERM GOAL #3   Title Pt will be able to remove cards/money from wallet with min difficulty/incr time in 4/5 trials.   Time 8   Period Weeks   Status Achieved  01/06/16   OT LONG TERM GOAL #4   Title Pt will be able to write name/address with at least 90% legibility.   Time 8   Period Weeks   Status Achieved  12/22/15  100%   OT LONG TERM GOAL #5   Title Pt will improve coordination for ADLs/picking up pills as shown by improving time on 9-hole peg test by at least 3sec bilaterally.   Baseline R-36.55, L-33.31sec   Time 8   Period Weeks   Status New   OT LONG TERM GOAL #6   Title Pt will demo improved ability to don/doff jacket as shown by improving time on PPT#4  by at least 4sec.   Baseline 33.10sec   Status New               Plan - 01/06/16 1100    Clinical Impression Statement Pt is progressing towards goals.  Fatigue impacts performance and pt fatigues quickly with fine motor tasks, but improves after short rest breaks.   Plan check remaining goals, anticipate d/c;  schedule follow up screens OT, PT, ST in approx 6 months.   OT Home Exercise Plan Education provided:  coordination HEP, reviewed PWR! moves in sitting (basic 4)   Consulted and Agree with Plan of Care Patient;Family member/caregiver   Family Member Consulted wife        Problem List Patient Active Problem List   Diagnosis Date Noted  . Encounter for therapeutic drug monitoring 01/05/2016  . Paroxysmal atrial fibrillation ([facility]) 12/24/2015  . 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 HLD (hyperlipidemia) 06/02/2015  . PD (Parkinson's disease) ([facility]) 06/02/2015  . Mitral regurgitation 03/17/2015  . CVA (cerebral infarction)   . TIA (transient ischemic attack) 03/05/2015  . Slurred speech 03/05/2015  . Anxiety 12/19/2013  . Parkinson's disease ([facility]) 08/06/2013  . Dyslipidemia 08/06/2013  . S/P mitral valve repair 08/06/2013    [facility] 01/06/2016, 12:48 PM  [place] [address] [place], [place], [address] Phone: [phone]   Fax:  [phone]  Name: [name] [name] MRN: [id number] Date of Birth: [birth year]  Willa Frater, OTR/L [facility] [address] [place], [place]  [address] [phone] phone [phone] 01/06/2016 12:48 PM

## 2016-01-07 ENCOUNTER — Ambulatory Visit (INDEPENDENT_AMBULATORY_CARE_PROVIDER_SITE_OTHER): Payer: Medicare Other | Admitting: *Deleted

## 2016-01-07 DIAGNOSIS — I63412 Cerebral infarction due to embolism of left middle cerebral artery: Secondary | ICD-10-CM

## 2016-01-07 NOTE — Progress Notes (Signed)
Carelink Summary Report / Loop Recorder 

## 2016-01-13 ENCOUNTER — Ambulatory Visit (INDEPENDENT_AMBULATORY_CARE_PROVIDER_SITE_OTHER): Payer: Medicare Other | Admitting: *Deleted

## 2016-01-13 ENCOUNTER — Ambulatory Visit: Payer: Medicare Other | Admitting: Occupational Therapy

## 2016-01-13 DIAGNOSIS — I480 Paroxysmal atrial fibrillation: Secondary | ICD-10-CM | POA: Diagnosis not present

## 2016-01-13 DIAGNOSIS — I63412 Cerebral infarction due to embolism of left middle cerebral artery: Secondary | ICD-10-CM | POA: Diagnosis not present

## 2016-01-13 DIAGNOSIS — G458 Other transient cerebral ischemic attacks and related syndromes: Secondary | ICD-10-CM

## 2016-01-13 DIAGNOSIS — R258 Other abnormal involuntary movements: Secondary | ICD-10-CM | POA: Diagnosis not present

## 2016-01-13 DIAGNOSIS — R29898 Other symptoms and signs involving the musculoskeletal system: Secondary | ICD-10-CM

## 2016-01-13 DIAGNOSIS — R279 Unspecified lack of coordination: Secondary | ICD-10-CM

## 2016-01-13 DIAGNOSIS — R4189 Other symptoms and signs involving cognitive functions and awareness: Secondary | ICD-10-CM

## 2016-01-13 DIAGNOSIS — Z9889 Other specified postprocedural states: Secondary | ICD-10-CM

## 2016-01-13 DIAGNOSIS — Z5181 Encounter for therapeutic drug level monitoring: Secondary | ICD-10-CM

## 2016-01-13 DIAGNOSIS — R6889 Other general symptoms and signs: Secondary | ICD-10-CM

## 2016-01-13 DIAGNOSIS — R293 Abnormal posture: Secondary | ICD-10-CM

## 2016-01-13 DIAGNOSIS — R278 Other lack of coordination: Secondary | ICD-10-CM

## 2016-01-13 DIAGNOSIS — R2681 Unsteadiness on feet: Secondary | ICD-10-CM

## 2016-01-13 LAB — POCT INR: INR: 4.9

## 2016-01-13 NOTE — Therapy (Signed)
[facility] [address] [place], [place], [address] Phone: [phone]   Fax:  [phone]  Occupational Therapy Treatment  Patient Details  Name: [name] [name] MRN: [id number] Date of Birth: [birth year] Referring Provider: Dr. Eldridge Abrahams  Encounter Date: 01/13/2016      OT End of Session - 01/13/16 0821    Visit Number 7   Number of Visits 17   Date for OT Re-Evaluation 01/10/16   Authorization Type [employer], G-code needed, no visit limit, no auth    Authorization - Visit Number 7   Authorization - Number of Visits 10   OT Start Time 0810   OT Stop Time 0848   OT Time Calculation (min) 38 min   Activity Tolerance Patient tolerated treatment well   Behavior During Therapy [facility] for tasks assessed/performed      Past Medical History  Diagnosis Date  . Parkinson's disease ([place])   . S/P mitral valve replacement     mitral regurg, endocarditis  . Dyslipidemia   . Stroke [facility])     Past Surgical History  Procedure Laterality Date  . Mitral valve annuloplasty  04/10/2007     85m Edwards ring; r/t h/o MR and flail mitral leaflets due to endocarditis: MRI safe  . Cataract extraction  2009, 2012    right 2009, left 2012  . Transthoracic echocardiogram  08/15/2012    EF=>55%; normal LV systolic function; RV mildly dilated & systolic function mildly reduced; RA mod dilated; mild -mod MR & mildy increased gradients; mild TR; AV mildly sclerotic with mild-mod regurg  . Loop recorder implant Left 03/13/2015    Procedure: LOOP RECORDER IMPLANT;  Surgeon: GEvans Lance MD;  [facility];  Service: Cardiovascular;  Laterality: Left;  . Tee without cardioversion N/A 03/13/2015    Procedure: TRANSESOPHAGEAL ECHOCARDIOGRAM (TEE);  Surgeon: DLarey Dresser MD;  [place]  Service: Cardiovascular;  Laterality: N/A;    There were no vitals filed for this visit.  Visit Diagnosis:   Bradykinesia  Rigidity  Decreased coordination  Cognitive deficits  Unsteadiness  Decreased functional activity tolerance  Abnormal posture      Subjective Assessment - 01/13/16 0916    Subjective  "I've been working on writing at home"  Wife reports "This has been really helpful.  I am so impressed with OT"   Patient is accompained by: Family member   Patient Stated Goals improve ability to turn pages, get things out of wallet/pocket, write pick up pills   Currently in Pain? No/denies                      OT Treatments/Exercises (OP) - 01/13/16 0001    ADLs   Functional Mobility Large amplitude walking for warm-up with min cues/good performance and then to sit in chair.    ADL Comments checked goals and discussed progress.  Educated pt/wife in ways to incorporate fine motor HEP and big movement strategies throughout the day and importance of PWR! HEP and aerobic exercise.  Pt/wife verbalized understanding.   Neurological Re-education Exercises   Other Exercises 1 Tossing scarves with each UE in standing with use of PWR! movements (incorporating wt. shift, trunk rotation) and emphasis/min cues on associated trunk movement to prevent future complications and large amplitude movements.   Other Exercises 2 Standing wt. shifts side to side with UE reach to targets with min cueing.  OT Education - 01/13/16 0919    Education Details Reviewed big amplitude movement strategies for ADLs; recommendations for screens in approx 6 months/process; ways to prevent future complications; checked remaining goals and discussed progress; emphasis on continuing HEP and use of big amplitude movements   Person(s) Educated Patient   Methods Explanation   Comprehension Verbalized understanding          OT Short Term Goals - 01/06/16 0818    OT SHORT TERM GOAL #1   Title Pt will be independent with PD-specific HEP.--check STGs 12/11/15    Time 4   Period Weeks    Status Achieved  12/22/15  needs min-mod cues but wife able to provide.   OT SHORT TERM GOAL #2   Title Pt will improve ability to button as shown by buttoning/unbuttoning 3 buttons on table in less than 75sec.   Baseline 88.85sec   Time 4   Period Weeks   Status Achieved  12/22/15:  60.22sec with button hook   OT SHORT TERM GOAL #3   Title Pt will be able to pull objects out of pocket with only min incr time in 4/5 trials.   Time 4   Period Weeks   Status Achieved  01/06/16   OT SHORT TERM GOAL #4   Title Pt will be able to write name/address with at least 75% legibility.   Time 4   Period Weeks   Status Achieved  12/22/15 100%           OT Long Term Goals - 01/13/16 0831    OT LONG TERM GOAL #1   Title Pt will verbalize understanding of strategies/AE to assist with ADLs/IADLs prn.--check LTGs 01/11/16   Time 8   Period Weeks   Status Achieved  01/13/16   OT LONG TERM GOAL #2   Title Pt will improve ability to button as shown by buttoning/unbuttoning 3 buttons on table in less than 65sec.   Time 8   Period Weeks   Status Achieved  12/22/15  60.22sec with button hook   OT LONG TERM GOAL #3   Title Pt will be able to remove cards/money from wallet with min difficulty/incr time in 4/5 trials.   Time 8   Period Weeks   Status Achieved  01/06/16   OT LONG TERM GOAL #4   Title Pt will be able to write name/address with at least 90% legibility.   Time 8   Period Weeks   Status Achieved  12/22/15  100%   OT LONG TERM GOAL #5   Title Pt will improve coordination for ADLs/picking up pills as shown by improving time on 9-hole peg test by at least 3sec bilaterally.   Baseline R-36.55, L-33.31sec   Time 8   Period Weeks   Status Not Met  01/13/16:  not met.  R-42.64sec, L-39.13sec (inconsistent, fatigued during testing today)   OT LONG TERM GOAL #6   Title Pt will demo improved ability to don/doff jacket as shown by improving time on PPT#4 by at least 4sec.   Baseline  33.10sec   Status Not Met  01/13/16;  30.06sec               Plan - 01/13/16 1427    Clinical Impression Statement Pt has made good progress towards goals, but will continue to need cueing by wife for incr consistency/reinforcement of big movement strategies for ADLs.   Plan follow up OT, PT, ST screens in approx 6 months, d/c OT at  this time.   OT Home Exercise Plan Education provided:  coordination HEP, reviewed PWR! moves in sitting (basic 4); strategies for ADLs; ways to decr/prevent future complications   Consulted and Agree with Plan of Care Patient;Family member/caregiver   Family Member Consulted wife          G-Codes - Jan 28, 2016 1430    Functional Assessment Tool Used buttoning/unbuttoning 3 buttons on table in 60.22sec with button hook, writing 100% legibility, PPT#4 in 30.06sec   Functional Limitation Self care   Self Care Goal Status ([id number]) At least 20 percent but less than 40 percent impaired, limited or restricted   Self Care Discharge Status [phone]) At least 20 percent but less than 40 percent impaired, limited or restricted     [place]  Visits from Start of Care: see above  Current functional level related to goals / functional outcomes: See above   Remaining deficits: Bradykinesia, rigidity, abnormal posture, decr coordination, decr functional mobility for ADLs, decr activity tolerance, and cognitive deficits   Education / Equipment: Pt/wife instructed in PD-specific HEP, ways to prevent future complications, strategies/AE for incr ease with ADLs.  Pt/wife verbalized understanding of all education provided.  Plan: Patient agrees to discharge.  Patient goals were partially met. Patient is being discharged due to being pleased with the current functional level.  Pt would benefit from re-evaluation/occupational therapy screen in approx 6 months to assess for need for further therapy/functional changes due to progressive nature of  diagnosis.  ?????        Problem List Patient Active Problem List   Diagnosis Date Noted  . Encounter for therapeutic drug monitoring 01/05/2016  . Paroxysmal atrial fibrillation ([place]) 12/24/2015  . Cerebral infarction due to embolism of left middle cerebral artery ([place]) 06/02/2015  . HLD (hyperlipidemia) 06/02/2015  . PD (Parkinson's disease) ([place]) 06/02/2015  . Mitral regurgitation 03/17/2015  . CVA (cerebral infarction)   . TIA (transient ischemic attack) 03/05/2015  . Slurred speech 03/05/2015  . Anxiety 12/19/2013  . Parkinson's disease ([place]) 08/06/2013  . Dyslipidemia 08/06/2013  . S/P mitral valve repair 08/06/2013    [facility] 01/28/16, 2:36 PM  [facility] [address] [place], [place], [address] Phone: [phone]   Fax:  [phone]  Name: [name] [name] MRN: [id number] Date of Birth: 1932-11-17  Vianne Bulls, OTR/L [facility] [address] [place], [place]  [id number] [phone] phone [phone] 2016/01/28 2:36 PM

## 2016-01-20 ENCOUNTER — Ambulatory Visit (INDEPENDENT_AMBULATORY_CARE_PROVIDER_SITE_OTHER): Payer: Medicare Other | Admitting: *Deleted

## 2016-01-20 DIAGNOSIS — G458 Other transient cerebral ischemic attacks and related syndromes: Secondary | ICD-10-CM | POA: Diagnosis not present

## 2016-01-20 DIAGNOSIS — I480 Paroxysmal atrial fibrillation: Secondary | ICD-10-CM | POA: Diagnosis not present

## 2016-01-20 DIAGNOSIS — Z5181 Encounter for therapeutic drug level monitoring: Secondary | ICD-10-CM

## 2016-01-20 DIAGNOSIS — Z9889 Other specified postprocedural states: Secondary | ICD-10-CM

## 2016-01-20 DIAGNOSIS — I63412 Cerebral infarction due to embolism of left middle cerebral artery: Secondary | ICD-10-CM

## 2016-01-20 LAB — POCT INR: INR: 3.7

## 2016-01-26 ENCOUNTER — Ambulatory Visit (INDEPENDENT_AMBULATORY_CARE_PROVIDER_SITE_OTHER): Payer: Medicare Other

## 2016-01-26 ENCOUNTER — Other Ambulatory Visit: Payer: Self-pay | Admitting: Cardiology

## 2016-01-26 DIAGNOSIS — I63412 Cerebral infarction due to embolism of left middle cerebral artery: Secondary | ICD-10-CM | POA: Diagnosis not present

## 2016-01-26 DIAGNOSIS — Z5181 Encounter for therapeutic drug level monitoring: Secondary | ICD-10-CM

## 2016-01-26 DIAGNOSIS — G458 Other transient cerebral ischemic attacks and related syndromes: Secondary | ICD-10-CM

## 2016-01-26 DIAGNOSIS — Z9889 Other specified postprocedural states: Secondary | ICD-10-CM

## 2016-01-26 DIAGNOSIS — I480 Paroxysmal atrial fibrillation: Secondary | ICD-10-CM

## 2016-01-26 LAB — POCT INR: INR: 3.2

## 2016-01-29 LAB — CUP PACEART REMOTE DEVICE CHECK: MDC IDC SESS DTM: 20170116163544

## 2016-01-29 NOTE — Progress Notes (Signed)
Carelink summary report received. Battery status OK. Normal device function. No new symptom episodes, brady, or pause episodes.2 tachy- previously addressed, 1 AF previously addressed. Monthly summary reports and ROV/PRN

## 2016-02-02 ENCOUNTER — Ambulatory Visit (INDEPENDENT_AMBULATORY_CARE_PROVIDER_SITE_OTHER): Payer: Medicare Other | Admitting: Pharmacist

## 2016-02-02 DIAGNOSIS — Z5181 Encounter for therapeutic drug level monitoring: Secondary | ICD-10-CM

## 2016-02-02 DIAGNOSIS — G458 Other transient cerebral ischemic attacks and related syndromes: Secondary | ICD-10-CM

## 2016-02-02 DIAGNOSIS — Z9889 Other specified postprocedural states: Secondary | ICD-10-CM

## 2016-02-02 DIAGNOSIS — I480 Paroxysmal atrial fibrillation: Secondary | ICD-10-CM | POA: Diagnosis not present

## 2016-02-02 DIAGNOSIS — I63412 Cerebral infarction due to embolism of left middle cerebral artery: Secondary | ICD-10-CM

## 2016-02-02 LAB — POCT INR: INR: 2.6

## 2016-02-06 ENCOUNTER — Ambulatory Visit (INDEPENDENT_AMBULATORY_CARE_PROVIDER_SITE_OTHER): Payer: Medicare Other | Admitting: *Deleted

## 2016-02-06 DIAGNOSIS — I63412 Cerebral infarction due to embolism of left middle cerebral artery: Secondary | ICD-10-CM | POA: Diagnosis not present

## 2016-02-06 NOTE — Progress Notes (Signed)
Carelink Summary Report / Loop Recorder 

## 2016-02-16 ENCOUNTER — Ambulatory Visit (INDEPENDENT_AMBULATORY_CARE_PROVIDER_SITE_OTHER): Payer: Medicare Other | Admitting: *Deleted

## 2016-02-16 DIAGNOSIS — I63412 Cerebral infarction due to embolism of left middle cerebral artery: Secondary | ICD-10-CM | POA: Diagnosis not present

## 2016-02-16 DIAGNOSIS — Z9889 Other specified postprocedural states: Secondary | ICD-10-CM

## 2016-02-16 DIAGNOSIS — G458 Other transient cerebral ischemic attacks and related syndromes: Secondary | ICD-10-CM | POA: Diagnosis not present

## 2016-02-16 DIAGNOSIS — Z5181 Encounter for therapeutic drug level monitoring: Secondary | ICD-10-CM

## 2016-02-16 DIAGNOSIS — I480 Paroxysmal atrial fibrillation: Secondary | ICD-10-CM | POA: Diagnosis not present

## 2016-02-16 LAB — POCT INR: INR: 2.6

## 2016-02-24 ENCOUNTER — Ambulatory Visit (INDEPENDENT_AMBULATORY_CARE_PROVIDER_SITE_OTHER): Payer: Medicare Other | Admitting: Internal Medicine

## 2016-02-24 ENCOUNTER — Encounter: Payer: Self-pay | Admitting: Internal Medicine

## 2016-02-24 VITALS — BP 118/66 | HR 72 | Ht 68.0 in | Wt 188.6 lb

## 2016-02-24 DIAGNOSIS — I480 Paroxysmal atrial fibrillation: Secondary | ICD-10-CM

## 2016-02-24 LAB — CUP PACEART INCLINIC DEVICE CHECK: Date Time Interrogation Session: 20170404125511

## 2016-02-24 NOTE — Patient Instructions (Signed)

## 2016-02-24 NOTE — Progress Notes (Signed)
HPI [name] returns today for followup. He had a cryptogenic stroke and was found after ILR insertion to have atrial fibrillation. He is doing well and denies chest pain, sob, or edema. No syncope and no palpitations. Allergies  Allergen Reactions  . Ace Inhibitors Cough  . Demerol [Meperidine] Other (See Comments)    Parkinsons disease  . Extract Of Poison Ivy     Other reaction(s): Other (See Comments) No reaction listed     Current Outpatient Prescriptions  Medication Sig Dispense Refill  . acetaminophen (TYLENOL) 325 MG tablet Take 325 mg by mouth every 6 (six) hours as needed for headache.     Marland Kitchen amantadine (SYMMETREL) 100 MG capsule Take 100 mg by mouth 2 (two) times daily.    Marland Kitchen amoxicillin (AMOXIL) 500 MG capsule Take 500 mg by mouth See admin instructions. Pt only takes  prior to dental appt    . atorvastatin (LIPITOR) 20 MG tablet Take 20 mg by mouth daily at 6 PM.     . cholecalciferol (VITAMIN D) 1000 UNITS tablet Take 1,000 Units by mouth daily.    Marland Kitchen diltiazem (CARDIZEM CD) 120 MG 24 hr capsule Take 1 capsule (120 mg total) by mouth daily. 90 capsule 1  . Docusate Calcium (STOOL SOFTENER PO) Take 1 capsule by mouth as directed.     . donepezil (ARICEPT) 10 MG tablet Take 1 tablet by mouth at bedtime.    Marland Kitchen escitalopram (LEXAPRO) 10 MG tablet Take 10 mg by mouth daily.    Marland Kitchen ibandronate (BONIVA) 150 MG tablet Take 1 tablet by mouth every 30 (thirty) days.    . Multiple Vitamins-Minerals (MULTIVITAMIN PO) Take 1 tablet by mouth daily.     . rasagiline (AZILECT) 1 MG TABS tablet Take 1 mg by mouth every morning. .    . rotigotine (NEUPRO) 4 MG/24HR Place 1 patch onto the skin daily.    Marland Kitchen RYTARY 48.75-195 MG CPCR Take 9 capsules by mouth See admin instructions. 3 caps in the morning with Azelect, 3 caps at 1pm with Amantadine, 2 caps at 6pm with Amantadine and 1 cap at bedtime.    . vitamin B-12 (CYANOCOBALAMIN) 1000 MCG tablet Take 1,000 mcg by mouth every other  day.     . vitamin E 400 UNIT capsule Take 400 Units by mouth daily.    Marland Kitchen warfarin (COUMADIN) 5 MG tablet Take as directed by coumadin clinic 30 tablet 3   No current facility-administered medications for this visit.     Past Medical History  Diagnosis Date  . Parkinson's disease ([facility])   . S/P mitral valve replacement     mitral regurg, endocarditis  . Dyslipidemia   . Stroke ([facility])     ROS:   All systems reviewed and negative except as noted in the HPI.   Past Surgical History  Procedure Laterality Date  . Mitral valve annuloplasty  04/10/2007     26mm Edwards ring; r/t h/o MR and flail mitral leaflets due to endocarditis: MRI safe  . Cataract extraction  2009, 2012    right 2009, left 2012  . Transthoracic echocardiogram  08/15/2012    EF=>55%; normal LV systolic function; RV mildly dilated & systolic function mildly reduced; RA mod dilated; mild -mod MR & mildy increased gradients; mild TR; AV mildly sclerotic with mild-mod regurg  . Loop recorder implant Left 03/13/2015    Procedure: LOOP RECORDER IMPLANT;  Surgeon: Marinus Maw, MD;  [facility];  Service: Cardiovascular;  Laterality: Left;  . Tee without cardioversion N/A 03/13/2015    Procedure: TRANSESOPHAGEAL ECHOCARDIOGRAM (TEE);  Surgeon: Laurey Moralealton S McLean, MD;  [facility];  Service: Cardiovascular;  Laterality: N/A;     Family History  Problem Relation Age of Onset  . Stroke Mother     cerebral hemorrahge  . Heart attack Mother   . Hypertension Brother   . Valvular heart disease Brother   . Prostate cancer Father   . Heart attack Father      Social History   Social History  . Marital Status: Married    Spouse Name: N/A  . Number of Children: 3  . Years of Education: law school   Occupational History  . retired    Social History Main Topics  . Smoking status: Never Smoker   . Smokeless tobacco: Never Used  . Alcohol Use: Yes     Comment: 1 can of beer or 1 wine every day  .  Drug Use: No  . Sexual Activity: Not on file   Other Topics Concern  . Not on file   Social History Narrative     BP 118/66 mmHg  Pulse 72  Ht 5\' 8"  (1.727 m)  Wt 188 lb 9.6 oz (85.548 kg)  BMI 28.68 kg/m2  Physical Exam:  Well appearing 82 yo man, NAD HEENT: Unremarkable Neck:  6 cm JVD, no thyromegally Lymphatics:  No adenopathy Back:  No CVA tenderness Lungs:  Clear with no wheezes HEART:  Regular rate rhythm, 1/6 systolic murmurs, no rubs, no clicks Abd:  soft, positive bowel sounds, no organomegally, no rebound, no guarding Ext:  2 plus pulses, no edema, no cyanosis, no clubbing Skin:  No rashes no nodules Neuro:  CN II through XII intact, motor grossly intact   DEVICE  ILR is working normally with minimal atrial fib and no pauses   Assess/Plan: 1. PAF - he is maintaining NSR over 99.9% of the time. Will follow. He will continue warfarin 2. ILR - his device is working normally. Will follow. 3. HTN - his blood pressure is well controlled. No change in meds.  Leonia ReevesGregg Roshan [name],M.D.

## 2016-03-08 ENCOUNTER — Ambulatory Visit (INDEPENDENT_AMBULATORY_CARE_PROVIDER_SITE_OTHER): Payer: Medicare Other | Admitting: *Deleted

## 2016-03-08 DIAGNOSIS — I63412 Cerebral infarction due to embolism of left middle cerebral artery: Secondary | ICD-10-CM

## 2016-03-08 NOTE — Progress Notes (Signed)
Carelink Summary Report / Loop Recorder 

## 2016-03-15 ENCOUNTER — Ambulatory Visit (INDEPENDENT_AMBULATORY_CARE_PROVIDER_SITE_OTHER): Payer: Medicare Other

## 2016-03-15 DIAGNOSIS — Z9889 Other specified postprocedural states: Secondary | ICD-10-CM

## 2016-03-15 DIAGNOSIS — G458 Other transient cerebral ischemic attacks and related syndromes: Secondary | ICD-10-CM | POA: Diagnosis not present

## 2016-03-15 DIAGNOSIS — I63412 Cerebral infarction due to embolism of left middle cerebral artery: Secondary | ICD-10-CM | POA: Diagnosis not present

## 2016-03-15 DIAGNOSIS — I480 Paroxysmal atrial fibrillation: Secondary | ICD-10-CM

## 2016-03-15 DIAGNOSIS — Z5181 Encounter for therapeutic drug level monitoring: Secondary | ICD-10-CM

## 2016-03-15 LAB — CUP PACEART REMOTE DEVICE CHECK: MDC IDC SESS DTM: 20170215170700

## 2016-03-15 LAB — POCT INR: INR: 1.9

## 2016-03-15 NOTE — Progress Notes (Signed)
Carelink summary report received. Battery status OK. Normal device function. No new symptom episodes, tachy episodes, brady, or pause episodes. 1 AF 0% ECG appears SA PVCs. +warfarin. Good histogram. Monthly summary reports and ROV/PRN

## 2016-04-02 ENCOUNTER — Other Ambulatory Visit (INDEPENDENT_AMBULATORY_CARE_PROVIDER_SITE_OTHER): Payer: Medicare Other | Admitting: *Deleted

## 2016-04-02 DIAGNOSIS — Z9889 Other specified postprocedural states: Secondary | ICD-10-CM | POA: Diagnosis not present

## 2016-04-02 DIAGNOSIS — I480 Paroxysmal atrial fibrillation: Secondary | ICD-10-CM

## 2016-04-02 LAB — CBC WITH DIFFERENTIAL/PLATELET
BASOS ABS: 0 {cells}/uL (ref 0–200)
Basophils Relative: 0 %
EOS ABS: 98 {cells}/uL (ref 15–500)
Eosinophils Relative: 2 %
HEMATOCRIT: 41.9 % (ref 38.5–50.0)
HEMOGLOBIN: 14.3 g/dL (ref 13.2–17.1)
Lymphocytes Relative: 28 %
Lymphs Abs: 1372 cells/uL (ref 850–3900)
MCH: 31.2 pg (ref 27.0–33.0)
MCHC: 34.1 g/dL (ref 32.0–36.0)
MCV: 91.5 fL (ref 80.0–100.0)
MONO ABS: 490 {cells}/uL (ref 200–950)
MONOS PCT: 10 %
MPV: 9.4 fL (ref 7.5–12.5)
NEUTROS ABS: 2940 {cells}/uL (ref 1500–7800)
Neutrophils Relative %: 60 %
PLATELETS: 148 10*3/uL (ref 140–400)
RBC: 4.58 MIL/uL (ref 4.20–5.80)
RDW: 14 % (ref 11.0–15.0)
WBC: 4.9 10*3/uL (ref 3.8–10.8)

## 2016-04-02 NOTE — Addendum Note (Signed)
Addended by: Tonita PhoenixBOWDEN, Timea Breed K on: 04/02/2016 07:47 AM   Modules accepted: Orders

## 2016-04-05 ENCOUNTER — Ambulatory Visit: Payer: Medicare Other | Admitting: Neurology

## 2016-04-05 ENCOUNTER — Other Ambulatory Visit: Payer: Medicare Other

## 2016-04-06 ENCOUNTER — Ambulatory Visit (INDEPENDENT_AMBULATORY_CARE_PROVIDER_SITE_OTHER): Payer: Medicare Other | Admitting: *Deleted

## 2016-04-06 DIAGNOSIS — I63412 Cerebral infarction due to embolism of left middle cerebral artery: Secondary | ICD-10-CM

## 2016-04-06 NOTE — Progress Notes (Signed)
Carelink Summary Report / Loop Recorder 

## 2016-04-07 LAB — CUP PACEART REMOTE DEVICE CHECK: Date Time Interrogation Session: 20170516173654

## 2016-04-12 ENCOUNTER — Ambulatory Visit (INDEPENDENT_AMBULATORY_CARE_PROVIDER_SITE_OTHER): Payer: Medicare Other | Admitting: *Deleted

## 2016-04-12 DIAGNOSIS — Z5181 Encounter for therapeutic drug level monitoring: Secondary | ICD-10-CM

## 2016-04-12 DIAGNOSIS — G458 Other transient cerebral ischemic attacks and related syndromes: Secondary | ICD-10-CM | POA: Diagnosis not present

## 2016-04-12 DIAGNOSIS — I480 Paroxysmal atrial fibrillation: Secondary | ICD-10-CM

## 2016-04-12 DIAGNOSIS — I63412 Cerebral infarction due to embolism of left middle cerebral artery: Secondary | ICD-10-CM

## 2016-04-12 DIAGNOSIS — Z9889 Other specified postprocedural states: Secondary | ICD-10-CM

## 2016-04-12 LAB — POCT INR: INR: 3.2

## 2016-04-14 ENCOUNTER — Telehealth: Payer: Self-pay | Admitting: Pharmacist

## 2016-04-14 NOTE — Telephone Encounter (Signed)
Patient scheduled to have dental extractions with Dr. Angela BurkeLuis Benitez, date not scheduled yet. Per Dr. Shirlee LatchMcLean, pt would need Lovenox bridging if off Coumadin due to prior CVA. Dr. Shirlee LatchMcLean spoke with Dr. Russella DarBenitez who confirmed that he would do extractions with patient remaining on Coumadin. We will check his INR a few days before procedure to keep him on the low end of his range. Pt will call when he has procedure date set so that we can bring him in for INR check ahead of time.

## 2016-04-16 LAB — CUP PACEART REMOTE DEVICE CHECK: MDC IDC SESS DTM: 20170317173637

## 2016-04-19 LAB — CUP PACEART REMOTE DEVICE CHECK: Date Time Interrogation Session: 20170416173636

## 2016-04-19 NOTE — Progress Notes (Signed)
Carelink summary report received. Battery status OK. Normal device function. No new symptom episodes, tachy episodes, brady, or pause episodes. No new AF episodes. Monthly summary reports and ROV/PRN 

## 2016-04-21 ENCOUNTER — Ambulatory Visit (INDEPENDENT_AMBULATORY_CARE_PROVIDER_SITE_OTHER): Payer: Medicare Other | Admitting: Neurology

## 2016-04-21 ENCOUNTER — Encounter: Payer: Self-pay | Admitting: Neurology

## 2016-04-21 VITALS — BP 92/55 | HR 65 | Ht 66.0 in | Wt 184.0 lb

## 2016-04-21 DIAGNOSIS — I480 Paroxysmal atrial fibrillation: Secondary | ICD-10-CM

## 2016-04-21 DIAGNOSIS — I63412 Cerebral infarction due to embolism of left middle cerebral artery: Secondary | ICD-10-CM | POA: Diagnosis not present

## 2016-04-21 DIAGNOSIS — E785 Hyperlipidemia, unspecified: Secondary | ICD-10-CM | POA: Diagnosis not present

## 2016-04-21 DIAGNOSIS — G20 Parkinson's disease: Secondary | ICD-10-CM | POA: Diagnosis not present

## 2016-04-21 NOTE — Progress Notes (Signed)
STROKE NEUROLOGY FOLLOW UP NOTE  NAME: [name] [name] DOB: 07/29/1932  REASON FOR VISIT: stroke follow up HISTORY FROM: chart and wife  Today we had the pleasure of seeing [name] HensenJohn [name] in follow-up at our Neurology Clinic. Pt was accompanied by wife.   History Summary [name] [name] is a 82 y.o. male with history of Parkinson's disease [facility], mitral valve replacement in 2008 and dyslipidemia, who developed acute onset of right facial droop and expressive aphasia. He did not receive IV t-PA due to rapidly resolving deficits. Stroke work up so far negative except MRI showed left frontal cortical small and subtle infarct. Concerning for initial large clot breaking off with symptoms improving. TEE also negative for clot or PFO. Loop recorder placed. He was discharged with ASA and lipitor.  Follow up 06/02/15 - the patient has been doing well, no recurrent symptoms. For loop recorder monitoring, so far he has not been called by cardiology. However, on the chart it seems there was on suspicious episode of AF detect in May, will need to clarify with cardiology. BP 104/64 in clinic today. He continues to follow up with [facility]. Daphene CalamitySiddicki in [facility] for PD treatment.   Follow up 10/06/15 - pt has been doing well. No recurrent symptoms. Loop recorder did not show afib and he follows with Dr. Shirlee LatchMcLean and confirmed no afib episodes. He is continued on ASA and lipitor. Continued follow up with [facility] Daphene [facility]iddicki [facility] for PD. Currently put on aricept. BP 96/61 which was his usual BP. He is not on BP meds. No other complains. He declined RESPECT ESUS trial back in 05/2015.  Interval History During the interval time, pt has been doing well from stroke standpoint. No recurrent stroke like symptoms. Loop recorder found runs of afib with RVR in 12/2015. Followed with Dr. Shirlee LatchMcLean and he was put on coumadin due to valvular afib. INR remains in good range. Last check on 04/12/16 INR 3.2. His BP today 92/55, asymptomatic.  However, wife stated that sometimes he felt groggy in the morning after getting up or after a nap getting up, did not check BP at that time. He continues following with [facility] for [facility].   REVIEW OF SYSTEMS: Full 14 system review of systems performed and notable only for those listed below and in HPI above, all others are negative:  Constitutional:   Cardiovascular:  Ear/Nose/Throat:   Skin:  Eyes:   Respiratory:   Gastroitestinal:   Genitourinary:  Hematology/Lymphatic:   Endocrine:  Musculoskeletal:  Aching muscles  Allergy/Immunology:   Neurological:   Psychiatric: anxious Sleep:   The following represents the patient's updated allergies and side effects list: Allergies  Allergen Reactions  . Ace Inhibitors Cough  . Demerol [Meperidine] Other (See Comments)    Parkinsons disease  . Extract Of Poison Ivy     Other reaction(s): Other (See Comments) No reaction listed    The neurologically relevant items on the patient's problem list were reviewed on today's visit.  Neurologic Examination  A problem focused neurological exam (12 or more points of the single system neurologic examination, vital signs counts as 1 point, cranial nerves count for 8 points) was performed.  Blood pressure 92/55, pulse 65, height 5\' 6"  (1.676 m), weight 184 lb (83.462 kg).   BP lying 104/70 - 58, sitting 102/68 - 67, standing 102/68 - 64  General - Well nourished, well developed, in no apparent distress.  Ophthalmologic - fundi not visualized due to small pupils.  Cardiovascular - Regular rate and rhythm.  Mental Status -  Level of arousal and orientation to time, place, and person were intact. Language including expression, naming, repetition, comprehension was assessed and found intact. Fund of Knowledge was assessed and was intact.  Cranial Nerves II - XII - II - Visual field intact OU. III, IV, VI - Extraocular movements intact. V - Facial sensation intact  bilaterally. VII - Facial movement intact bilaterally, however, mild right nasolabial fold flattening. VIII - Hearing & vestibular intact bilaterally. X - Palate elevates symmetrically. XI - Chin turning & shoulder shrug intact bilaterally. XII - Tongue protrusion intact.  Motor Strength - The patient's strength was normal in all extremities and pronator drift was absent. Bulk was normal and fasciculations were absent.  Motor Tone - Muscle tone was assessed at the neck and appendages and was normal and no rigidity.  Reflexes - The patient's reflexes were 1+ in all extremities and he had no pathological reflexes.  Sensory - Light touch, temperature/pinprick were assessed and were symmetrical.   Coordination - The patient had normal movements in the hands with no ataxia but with significant resting and action tremor. Pt also has dyskinesia throughout the body including neck, arms and legs.   Gait and Station - stooped posturing, slow small stride, mild shuffling gait, walks with cane.  Data reviewed: I personally reviewed the images and agree with the radiology interpretations.  Dg Chest 2 View 03/05/2015 Stable exam. No active cardiopulmonary disease.   Ct Head (brain) Wo Contrast 03/05/2015 Age related cerebral atrophy, ventriculomegaly and periventricular white matter disease. 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 No findings for acute hemispheric infarction or intracranial hemorrhage.   MRI HEAD 03/06/2015 1. Subtly increased 5 mm focus of high signal intensity within the posterior left frontal lobe on DWI sequence as above. Finding is favored to be artifactual in nature, although possible small acute ischemic infarct is not entirely excluded. Correlation with physical exam and history for possible acute ischemia in this territory is recommended. 2. No other acute intracranial infarct or other abnormality identified. 3. Small remote  anterior right frontal and posterior left frontal cortical infarcts as above. 4. Generalized cerebral atrophy.   MRA HEAD 03/06/2015 Unremarkable MRA of the intracranial circulation without proximal branch occlusion or hemodynamically significant stenosis.   CUS - Preliminary findings: Bilateral: 1-39% ICA stenosis. Vertebral artery flow is antegrade.   2D echo  03/06/2015 Study Conclusions - Left ventricle: The cavity size was normal. Wall thickness was normal. Systolic function was normal. The estimated ejection fraction was in the range of 55% to 60%. - Aortic valve: There was mild regurgitation. - Mitral valve: s/p MV annuloplasty. Echodenisity on atrial side of anterior mitrla leaflet probably reflects some prolapse, cannot completely exclude small mass There are several jets of MR, one posterior, one central, one anteiror. Pkea nad mean gradients through the valver are 9 and 3 mm respectively This is increased from gradients seen in 2014. There was moderate regurgitation. Valve area by continuity equation (using LVOT flow): 1.27 cm^2. - Left atrium: The atrium was severely dilated. - Right ventricle: The cavity size was moderately dilated. Wall thickness was normal. - Right atrium: The atrium was mildly dilated. - Pericardium, extracardiac: A trivial pericardial effusion was identified.  TEE - - Left ventricle: The cavity size was normal. Wall thickness was normal. The estimated ejection fraction was 55%. Wall motion was normal; there were no regional wall motion abnormalities. - Aortic  valve: There was no stenosis. There was trivial regurgitation. - Aorta: Normal caliber aorta with no significant plaque. - Mitral valve: The patient is s/p mitral valve repair with annuloplasty ring. There does appear to be a loose suture posteriorly with a small area of annuloplasty ring dehiscence. There is moderate mitral regurgitation with  one perivalvular jet posteriorly and a small central jet. There is no significant stenosis. No vegetation noted. - Left atrium: There was mild left atrial enlargement. The LA appendage has been oversewn with a very small residual connection to the LA. There was no thrombus in the appendage. - Right ventricle: The cavity size was mildly dilated. Systolic function was normal. - Right atrium: No evidence of thrombus in the atrial cavity or appendage. - Atrial septum: No defect or patent foramen ovale was identified. Echo contrast study showed no right-to-left atrial level shunt, at baseline or with provocation.  Loop recorder - Afib with RVR episodes  Component     Latest Ref Rng 03/07/2015  Cholesterol     0 - 200 mg/dL 161  Triglycerides     <150 mg/dL 94  HDL Cholesterol     >39 mg/dL 48  Total CHOL/HDL Ratio      3.3  VLDL     0 - 40 mg/dL 19  LDL (calc)     0 - 99 mg/dL 93  Hemoglobin W9U     4.8 - 5.6 % 6.0 (H)  Mean Plasma Glucose      126   Assessment: As you may recall, he is a 82 y.o. Caucasian male with PMH of Parkinson's disease followed with [facility], mitral valve replacement in 2008 and dyslipidemia was admitted on 03/05/15 for right facial droop and expressive aphasia, rapid improving. Stroke work up so far negative except MRI showed left frontal cortical small and subtle infarct. TEE negative and loop recorder placed. Found to have afib RVR on loop recorder, put on coumadin due to valvular afib. Also put on cardizem for rate control. He declined for RESPECT ESUS. He has been doing well during the interval time. INR at goal. Found to have low BP, but asymptomatic and negative orthostatic vitals.  Plan:  - continue coumadin for stroke prevention and INR goal 2-3 - continue lipitor for stroke prevention - check BP at home with lying, sitting and standing and record and discuss with Dr. Delfina Redwood.  - follow up with Dr. Delfina Redwood for PD - follow up with  cardiology as scheduled.  - Follow up with your primary care physician for stroke risk factor modification. Recommend maintain blood pressure goal <130/80, diabetes with hemoglobin A1c goal below 6.5% and lipids with LDL cholesterol goal below 70 mg/dL.  - follow up in 6 months.  I spent more than 25 minutes of face to face time with the patient. Greater than 50% of time was spent in counseling and coordination of care. We discussed orthostatic hypotension, compliance with coumadin and INR check, follow up with cardiology and Dr. Delfina Redwood.    No orders of the defined types were placed in this encounter.    Meds ordered this encounter  Medications  . escitalopram (LEXAPRO) 10 MG tablet    Sig:     Patient Instructions  - continue coumadin for stroke prevention and INR goal 2-3 - continue lipitor for stroke prevention - check BP at home with lying, sitting and standing to see if there is any BP changes with position. Record and discuss with Dr. Delfina Redwood.  - follow up  with Dr. Delfina Redwood for PD - follow up with cardiology as scheduled. May consider prior authorization for newer generation of blood thinners if desired - Follow up with your primary care physician for stroke risk factor modification. Recommend maintain blood pressure goal <130/80, diabetes with hemoglobin A1c goal below 6.5% and lipids with LDL cholesterol goal below 70 mg/dL.  - follow up in 6 months.    Marvel Plan, MD PhD [facility] [address], [address] [place], [place] [address] [phone]

## 2016-04-21 NOTE — Patient Instructions (Signed)
-   continue coumadin for stroke prevention and INR goal 2-3 - continue lipitor for stroke prevention - check BP at home with lying, sitting and standing to see if there is any BP changes with position. Record and discuss with Dr. Delfina RedwoodSidiqqi.  - follow up with Dr. Delfina RedwoodSidiqqi for PD - follow up with cardiology as scheduled. May consider prior authorization for newer generation of blood thinners if desired - Follow up with your primary care physician for stroke risk factor modification. Recommend maintain blood pressure goal <130/80, diabetes with hemoglobin A1c goal below 6.5% and lipids with LDL cholesterol goal below 70 mg/dL.  - follow up in 6 months.

## 2016-04-27 ENCOUNTER — Ambulatory Visit (INDEPENDENT_AMBULATORY_CARE_PROVIDER_SITE_OTHER): Payer: Medicare Other | Admitting: *Deleted

## 2016-04-27 DIAGNOSIS — Z5181 Encounter for therapeutic drug level monitoring: Secondary | ICD-10-CM

## 2016-04-27 DIAGNOSIS — Z9889 Other specified postprocedural states: Secondary | ICD-10-CM

## 2016-04-27 DIAGNOSIS — I480 Paroxysmal atrial fibrillation: Secondary | ICD-10-CM

## 2016-04-27 DIAGNOSIS — I63412 Cerebral infarction due to embolism of left middle cerebral artery: Secondary | ICD-10-CM

## 2016-04-27 DIAGNOSIS — G458 Other transient cerebral ischemic attacks and related syndromes: Secondary | ICD-10-CM

## 2016-04-27 LAB — POCT INR: INR: 3.5

## 2016-05-06 ENCOUNTER — Ambulatory Visit (INDEPENDENT_AMBULATORY_CARE_PROVIDER_SITE_OTHER): Payer: Medicare Other | Admitting: *Deleted

## 2016-05-06 DIAGNOSIS — I63412 Cerebral infarction due to embolism of left middle cerebral artery: Secondary | ICD-10-CM

## 2016-05-07 NOTE — Progress Notes (Signed)
Carelink Summary Report / Loop Recorder 

## 2016-05-11 ENCOUNTER — Ambulatory Visit (INDEPENDENT_AMBULATORY_CARE_PROVIDER_SITE_OTHER): Payer: Medicare Other | Admitting: Surgery

## 2016-05-11 DIAGNOSIS — I63412 Cerebral infarction due to embolism of left middle cerebral artery: Secondary | ICD-10-CM

## 2016-05-11 DIAGNOSIS — I480 Paroxysmal atrial fibrillation: Secondary | ICD-10-CM | POA: Diagnosis not present

## 2016-05-11 DIAGNOSIS — G458 Other transient cerebral ischemic attacks and related syndromes: Secondary | ICD-10-CM | POA: Diagnosis not present

## 2016-05-11 DIAGNOSIS — Z5181 Encounter for therapeutic drug level monitoring: Secondary | ICD-10-CM | POA: Diagnosis not present

## 2016-05-11 DIAGNOSIS — Z9889 Other specified postprocedural states: Secondary | ICD-10-CM

## 2016-05-11 LAB — POCT INR: INR: 2.3

## 2016-06-01 ENCOUNTER — Ambulatory Visit (INDEPENDENT_AMBULATORY_CARE_PROVIDER_SITE_OTHER): Payer: Medicare Other

## 2016-06-01 DIAGNOSIS — Z5181 Encounter for therapeutic drug level monitoring: Secondary | ICD-10-CM

## 2016-06-01 DIAGNOSIS — G458 Other transient cerebral ischemic attacks and related syndromes: Secondary | ICD-10-CM | POA: Diagnosis not present

## 2016-06-01 DIAGNOSIS — I63412 Cerebral infarction due to embolism of left middle cerebral artery: Secondary | ICD-10-CM | POA: Diagnosis not present

## 2016-06-01 DIAGNOSIS — I480 Paroxysmal atrial fibrillation: Secondary | ICD-10-CM

## 2016-06-01 DIAGNOSIS — Z9889 Other specified postprocedural states: Secondary | ICD-10-CM

## 2016-06-01 LAB — POCT INR: INR: 2

## 2016-06-04 LAB — CUP PACEART REMOTE DEVICE CHECK: Date Time Interrogation Session: 20170615174154

## 2016-06-07 ENCOUNTER — Ambulatory Visit (INDEPENDENT_AMBULATORY_CARE_PROVIDER_SITE_OTHER): Payer: Medicare Other | Admitting: *Deleted

## 2016-06-07 DIAGNOSIS — I63412 Cerebral infarction due to embolism of left middle cerebral artery: Secondary | ICD-10-CM | POA: Diagnosis not present

## 2016-06-07 DIAGNOSIS — I480 Paroxysmal atrial fibrillation: Secondary | ICD-10-CM | POA: Diagnosis not present

## 2016-06-07 DIAGNOSIS — Z5181 Encounter for therapeutic drug level monitoring: Secondary | ICD-10-CM

## 2016-06-07 DIAGNOSIS — G458 Other transient cerebral ischemic attacks and related syndromes: Secondary | ICD-10-CM

## 2016-06-07 DIAGNOSIS — Z9889 Other specified postprocedural states: Secondary | ICD-10-CM

## 2016-06-07 LAB — POCT INR: INR: 2.4

## 2016-06-07 NOTE — Progress Notes (Signed)
Carelink Summary Report / Loop Recorder 

## 2016-06-12 ENCOUNTER — Other Ambulatory Visit: Payer: Self-pay | Admitting: Cardiology

## 2016-07-01 ENCOUNTER — Ambulatory Visit: Payer: Medicare Other | Attending: Internal Medicine | Admitting: Physical Therapy

## 2016-07-01 ENCOUNTER — Ambulatory Visit: Payer: Medicare Other

## 2016-07-01 ENCOUNTER — Ambulatory Visit: Payer: Medicare Other | Admitting: Occupational Therapy

## 2016-07-01 DIAGNOSIS — R471 Dysarthria and anarthria: Secondary | ICD-10-CM

## 2016-07-01 DIAGNOSIS — R29898 Other symptoms and signs involving the musculoskeletal system: Secondary | ICD-10-CM | POA: Insufficient documentation

## 2016-07-01 DIAGNOSIS — R278 Other lack of coordination: Secondary | ICD-10-CM

## 2016-07-01 LAB — CUP PACEART REMOTE DEVICE CHECK: MDC IDC SESS DTM: 20170715181338

## 2016-07-01 NOTE — Therapy (Signed)
[facility] [address] [place], [place], [address] Phone: [phone]   Fax:  [phone]  Patient Details  Name: [name] HensenJohn [name] MRN: [id number] Date of Birth: 01/01/1932 Referring Provider:  Rodrigo RanPerini, Mark, MD  Encounter Date: 07/01/2016 Physical Therapy Parkinson's Disease Screen  Pt reports having >6 falls in the past 6 months, mainly on uneven surfaces.  Wife notices pt goes to fast at times and feels like this contributes to falls.  Pt is doing PWR! Moves class once a week.  Timed Up and Go test:  9.39 sec no device    (8.77 sec 10/02/15 screen)  10 meter walk test:  4.15 ft/sec=7.91 seconds no device   (4.04 ft/sec on 10/02/15 screen)  5 time sit to stand test:  9.91 seconds   (11.98 sec on 10/02/15 screen)   Above tests performed by Modena Morrowenise Robertson, PTA  Newell Coralenise Terry Robertson, [place]A [facility] 07/01/16 12:44 PM Phone: [phone] Fax: [phone]    Per Lonia BloodAmy Marriott, PT, patient would benefit from Physical Therapy evaluation due to recent falls.      [facility] [address] [place], [place], [address] Phone: [phone]   Fax:  [phone]

## 2016-07-01 NOTE — Therapy (Signed)
[facility] [address] [place], [place], [address] Phone: [phone]   Fax:  [phone]  Patient Details  Name: [name] HensenJohn [name] MRN: [id number] Date of Birth: 09/27/1932 Referring Provider:  Rodrigo RanPerini, Mark, MD  Encounter Date: 07/01/2016  Occupational Therapy Parkinson's Disease Screen  Physical Performance Test item #2 (simulated eating):   15.91 sec  Physical Performance Test item #4 (donning/doffing jacket):   sec  9-hole peg test:    RUE  44.16 sec        LUE  30.66 secs  Other Comments:  Handwriting is legible and good size  Pt would benefit from occupational therapy evaluation due to  Decreased coordination for ADLs.   RINE,KATHRYN 07/01/2016, 8:37 AM Keene BreathKathryn Rine, OTR/L [phone] Phone: [phone] 4:13 PM 08/10/17[facility] [address] [place], [place], [address] Phone: [phone]   Fax:  [phone]

## 2016-07-01 NOTE — Therapy (Signed)
[facility] [address] [place], [place], [address] Phone: [phone]   Fax:  [phone]  Patient Details  Name: [name] HensenJohn [name] MRN: [id number] Date of Birth: 11/20/1932 Referring Provider:  Jaquita FoldsSiddiqui, Mustafa, MD  Speech Therapy Parkinson's Disease Screen   Decibel Level today: 70dB  (WNL=70-72 dB) with sound level meter 30cm away from pt's mouth. Pt's conversational volume has remained WNL since last treatment course.  Pt has not experienced difficulty in swallowing warranting objective evaluation, however SLP recommended pt swallow with effort with all POs  Pt does does not require speech therapy services at this time. Recommend ST screen in another 4-6 months    Encounter Date: 07/01/2016   [name] 07/01/2016, 8:16 AM  [facility] [address] [place], [place], [address] Phone: [phone]   Fax:  [phone]

## 2016-07-05 ENCOUNTER — Ambulatory Visit (INDEPENDENT_AMBULATORY_CARE_PROVIDER_SITE_OTHER): Payer: Medicare Other | Admitting: *Deleted

## 2016-07-05 DIAGNOSIS — I63412 Cerebral infarction due to embolism of left middle cerebral artery: Secondary | ICD-10-CM | POA: Diagnosis not present

## 2016-07-05 NOTE — Progress Notes (Signed)
Carelink Summary Report / Loop Recorder 

## 2016-07-06 ENCOUNTER — Ambulatory Visit (INDEPENDENT_AMBULATORY_CARE_PROVIDER_SITE_OTHER): Payer: Medicare Other | Admitting: Pharmacist

## 2016-07-06 DIAGNOSIS — Z5181 Encounter for therapeutic drug level monitoring: Secondary | ICD-10-CM | POA: Diagnosis not present

## 2016-07-06 DIAGNOSIS — Z9889 Other specified postprocedural states: Secondary | ICD-10-CM

## 2016-07-06 DIAGNOSIS — I63412 Cerebral infarction due to embolism of left middle cerebral artery: Secondary | ICD-10-CM | POA: Diagnosis not present

## 2016-07-06 DIAGNOSIS — I480 Paroxysmal atrial fibrillation: Secondary | ICD-10-CM | POA: Diagnosis not present

## 2016-07-06 DIAGNOSIS — G458 Other transient cerebral ischemic attacks and related syndromes: Secondary | ICD-10-CM | POA: Diagnosis not present

## 2016-07-06 LAB — POCT INR: INR: 1.9

## 2016-07-07 ENCOUNTER — Telehealth: Payer: Self-pay | Admitting: Occupational Therapy

## 2016-07-07 NOTE — Telephone Encounter (Signed)
Dr. Rubin PayorSiddiqui,  [name] was seen for a multi-disciplinary Parkinson's Team screening on 07/01/16.  Due to a decline in functional mobility, increased falls and a decline in coordination for ADLs, he can benefit from from PT and OT evaluations. If you agree please send a prescription for these services.  Thanks, [employer] CorporationKathryn Rine, OTR/L

## 2016-07-31 LAB — CUP PACEART REMOTE DEVICE CHECK: Date Time Interrogation Session: 20170814180913

## 2016-08-04 ENCOUNTER — Ambulatory Visit (INDEPENDENT_AMBULATORY_CARE_PROVIDER_SITE_OTHER): Payer: Medicare Other | Admitting: *Deleted

## 2016-08-04 ENCOUNTER — Ambulatory Visit: Payer: Medicare Other | Admitting: *Deleted

## 2016-08-04 DIAGNOSIS — I63412 Cerebral infarction due to embolism of left middle cerebral artery: Secondary | ICD-10-CM | POA: Diagnosis not present

## 2016-08-04 DIAGNOSIS — G458 Other transient cerebral ischemic attacks and related syndromes: Secondary | ICD-10-CM

## 2016-08-04 DIAGNOSIS — Z5181 Encounter for therapeutic drug level monitoring: Secondary | ICD-10-CM

## 2016-08-04 DIAGNOSIS — Z9889 Other specified postprocedural states: Secondary | ICD-10-CM

## 2016-08-04 DIAGNOSIS — I480 Paroxysmal atrial fibrillation: Secondary | ICD-10-CM | POA: Diagnosis not present

## 2016-08-04 LAB — POCT INR: INR: 2.5

## 2016-08-04 NOTE — Progress Notes (Signed)
Carelink Summary Report / Loop Recorder 

## 2016-08-10 ENCOUNTER — Ambulatory Visit: Payer: Medicare Other | Attending: Internal Medicine | Admitting: Physical Therapy

## 2016-08-10 ENCOUNTER — Ambulatory Visit: Payer: Medicare Other | Admitting: Occupational Therapy

## 2016-08-10 DIAGNOSIS — R2681 Unsteadiness on feet: Secondary | ICD-10-CM | POA: Diagnosis present

## 2016-08-10 DIAGNOSIS — R29898 Other symptoms and signs involving the musculoskeletal system: Secondary | ICD-10-CM

## 2016-08-10 DIAGNOSIS — R278 Other lack of coordination: Secondary | ICD-10-CM | POA: Insufficient documentation

## 2016-08-10 DIAGNOSIS — R41840 Attention and concentration deficit: Secondary | ICD-10-CM | POA: Diagnosis present

## 2016-08-10 DIAGNOSIS — R29818 Other symptoms and signs involving the nervous system: Secondary | ICD-10-CM

## 2016-08-10 DIAGNOSIS — R41842 Visuospatial deficit: Secondary | ICD-10-CM | POA: Diagnosis present

## 2016-08-10 DIAGNOSIS — R2689 Other abnormalities of gait and mobility: Secondary | ICD-10-CM | POA: Diagnosis present

## 2016-08-10 DIAGNOSIS — R293 Abnormal posture: Secondary | ICD-10-CM

## 2016-08-10 NOTE — Therapy (Signed)
[facility] [address] [place], [place], [address] Phone: [phone]   Fax:  [phone]  Occupational Therapy Evaluation  Patient Details  Name: [name] [name] MRN: [id number] Date of Birth: [birth year] Referring Provider: Dr. Rodrigo Ran  Encounter Date: 08/10/2016      OT End of Session - 08/10/16 1744    Visit Number 1   Number of Visits 12   Date for OT Re-Evaluation 10/09/16   Authorization Type [employer], no visit limit/auth, need G-code   Authorization - Visit Number 1   Authorization - Number of Visits 10   OT Start Time 0845   OT Stop Time 0933   OT Time Calculation (min) 48 min   Activity Tolerance Patient tolerated treatment well   Behavior During Therapy [facility] for tasks assessed/performed      Past Medical History:  Diagnosis Date  . Dyslipidemia   . Parkinson's disease (HCC)   . S/P mitral valve replacement    mitral regurg, endocarditis  . Stroke [facility])     Past Surgical History:  Procedure Laterality Date  . CATARACT EXTRACTION  2009, 2012   right 2009, left 2012  . LOOP RECORDER IMPLANT Left 03/13/2015   Procedure: LOOP RECORDER IMPLANT;  Surgeon: Marinus Maw, MD;  Location: [facility] CATH LAB;  Service: Cardiovascular;  Laterality: Left;  . 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 TEE WITHOUT CARDIOVERSION N/A 03/13/2015   Procedure: TRANSESOPHAGEAL ECHOCARDIOGRAM (TEE);  Surgeon: Laurey Morale, MD;  Location: [facility] ENDOSCOPY;  Service: Cardiovascular;  Laterality: N/A;  . TRANSTHORACIC ECHOCARDIOGRAM  08/15/2012   EF=>55%; normal LV systolic function; RV mildly dilated & systolic function mildly reduced; RA mod dilated; mild -mod MR & mildy increased gradients; mild TR; AV mildly sclerotic with mild-mod regurg    There were no vitals filed for this visit.      Subjective Assessment - 08/10/16 0856    Subjective  wife reports that pt has not been performing previous coordination HEP   Patient is accompained by: Family member  wife   Patient Stated Goals improve coordination, balance   Currently in Pain? No/denies           [facility] OT Assessment - 08/10/16 0001      Assessment   Diagnosis Parkinson's Disease   Referring Provider Dr. Rodrigo Ran   Onset Date 07/01/16  screened by OT and recommended therapy   Prior Therapy last d/c from OT 01/13/16     Precautions   Precautions Fall     Balance Screen   Has the patient fallen in the past 6 months No     Home  Environment   Family/patient expects to be discharged to: Private residence   Lives With Spouse     Prior Function   Level of Independence Independent with basic ADLs   Leisure participates in [place]! moves ex class     ADL   Eating/Feeding --  with spills   Grooming Modified independent  incr time with electric razor, electric toothbrush   Upper Body Bathing --  incr time, has back brush   Lower Body Bathing Increased time   Upper Body Dressing Increased time  min difficulty, has button hook   Lower Body Dressing Increased time   Toilet Tranfer Modified independent  min difficulty   Toileting - Clothing Manipulation Modified independent   Toileting -  Hygiene Modified Independent   DealerTub/Shower Transfer Modified independent   Tub/Shower Transfer Equipment Grab bars;Shower seat without back     IADL   Prior Level of Function Light Housekeeping wife performs most   Light Housekeeping --  vacuums, dusts   Prior Level of Function Meal Prep wife performs and has always done so   Prior Level of Function Community Mobility no longer drives   Community Mobility Relies on family or friends for transportation   Medication Management Has difficulty remembering to take medication  wife sets up   Financial Management Dependent     Mobility   Mobility Status History of falls  festination   Mobility Status Comments  Ambulates with cane in mornings and at night, difficulty with turns  difficulty with shuffling with carrying thing     Written Expression   Dominant Hand Right   Handwriting --  not assessed due to time constraints     Vision - History   Baseline Vision Wears glasses only for reading  has glasses for distance, but doesn't wear consistently   Additional Comments reports diplopia with quick head movements      Vision Assessment   Comment Reports diplopia with quick head movements intermittently      Activity Tolerance   Activity Tolerance Comments takes 3 naps a day     Cognition   Overall Cognitive Status Impaired/Different from baseline  no longer able to do financial management   Attention --  impaired and decr alertness reported   Memory Impaired   Memory Impairment Decreased short term memory   Awareness Impaired   Bradyphrenia Yes     Observation/Other Assessments   Standing Functional Reach Test R-12", L-10"   Other Surveys  Select   Physical Performance Test   Yes   Simulated Eating Time (seconds) 13.22   Simulated Eating Comments held spoon at the end   Donning Doffing Jacket Time (seconds) 17.28sec    Donning Doffing Jacket Comments used strategies     Sensation   Additional Comments Pt reports numbness in L foot at night     Coordination   9 Hole Peg Test Right;Left   Right 9 Hole Peg Test 37.72 with shoulder compensation   Left 9 Hole Peg Test 30.28   Box and Blocks R-45-3=42blocks, L-41blocks     Tone   Assessment Location Right Upper Extremity;Left Upper Extremity     ROM / Strength   AROM / PROM / Strength AROM     AROM   Overall AROM  Within functional limits for tasks performed   Overall AROM Comments BUEs     RUE Tone   RUE Tone Mild  rigidity     LUE Tone   LUE Tone Mild  rigidity                         OT Education - 08/10/16 1741    Education provided Yes   Education Details PD Visual-perceptual changes that can  occur and how they can impact function/balance--recommendation to discuss with neurologist/eye doctor   Person(s) Educated Patient;Spouse   Methods Explanation   Comprehension Verbalized understanding          OT Short Term Goals - 08/10/16 2206      OT SHORT TERM GOAL #1   Title -------------------   Time --     OT SHORT TERM GOAL #2   Title -----------------     OT SHORT TERM GOAL #3  Title -----------------     OT SHORT TERM GOAL #4   Title ------------------           OT Long Term Goals - 08/25/2016 2157      OT LONG TERM GOAL #1   Title Pt will verbalize understanding of strategies/AE to assist with ADLs/IADLs prn.--check LTGs 01/11/16   Time 4   Period Weeks   Status New     OT LONG TERM GOAL #2   Title Pt will improve coordination for ADLs as shown by improving time on 9-hole peg test by at least 4sec with RUE.   Baseline 37.72   Time 4   Period Weeks   Status New     OT LONG TERM GOAL #3   Title Pt will improve UE functional reaching/coordination as shown by improving score on box and blocks test by at least 4 bilaterally.   Time 4   Period Weeks   Status New     OT LONG TERM GOAL #4   Title Pt will be perform updated HEP with min cues.   Time 4   Period Weeks   Status New     OT LONG TERM GOAL #5   Title Assess handwriting and set goal prn   Time 4   Period Weeks   Status New               Plan - 2016/08/25 1746    Clinical Impression Statement Pt is a 82 y.o. male with diagnosis of Parkinson's disease.  Pt with PMH that includes mitral valve replacement and hx of CVA.  Pt presents with the following deficits that impact ADL performance:  bradykinesia, mild rigidity, dyskinesias and tremors, decr coordination, decr balance/functional mobility for ADLs, cognitive deficits, visual perceptual deficits  Pt would benefit from occupational therapy to prevent future complications, re-establish PD-specific HEP, and improve ADL/IADL performance.    Rehab Potential Good   OT Frequency 2x / week   OT Duration 4 weeks  +eval, anticipate delayed start due to vacation/schedule conflicts   OT Treatment/Interventions Self-care/ADL training;Cryotherapy;Parrafin;Therapeutic exercise;DME and/or AE instruction;Building services engineer;Therapeutic activities;Patient/family education;Balance training;Cognitive remediation/compensation;Splinting;Manual Therapy;Neuromuscular education;Fluidtherapy;Ultrasound;Moist Heat;Energy conservation;Passive range of motion;Therapeutic exercises;Visual/perceptual remediation/compensation   Plan review/update coordination HEP   Recommended Other Services receiving PT   Consulted and Agree with Plan of Care Family member/caregiver;Patient   Family Member Consulted wife      Patient will benefit from skilled therapeutic intervention in order to improve the following deficits and impairments:  Decreased balance, Abnormal gait, Decreased mobility, Difficulty walking, Impaired vision/preception, Impaired tone, Decreased cognition, Decreased activity tolerance, Decreased coordination, Decreased knowledge of use of DME, Impaired UE functional use, Improper spinal/pelvic alignment (bradykinesia, dyskinesias)  Visit Diagnosis: Other symptoms and signs involving the nervous system  Attention and concentration deficit  Other symptoms and signs involving the musculoskeletal system  Other lack of coordination  Other abnormalities of gait and mobility  Unsteadiness on feet  Visuospatial deficit      G-Codes - August 25, 2016 04/15/55    Functional Assessment Tool Used 9-hole peg test:  R-37.72sec, L-30.28sec.  Box and blocks test:  R-42, L-41 blocks   Functional Limitation Carrying, moving and handling objects   Carrying, Moving and Handling Objects Current Status [phone]) At least 20 percent but less than 40 percent impaired, limited or restricted   Carrying, Moving and Handling Objects Goal Status ([id number]) At least 1  percent but less than 20 percent impaired, limited or restricted      Problem List  Patient Active Problem List   Diagnosis Date Noted  . Encounter for therapeutic drug monitoring 01/05/2016  . Paroxysmal atrial fibrillation (HCC) 12/24/2015  . 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 HLD (hyperlipidemia) 06/02/2015  . PD (Parkinson's disease) (HCC) 06/02/2015  . Mitral regurgitation 03/17/2015  . CVA (cerebral infarction)   . TIA (transient ischemic attack) 03/05/2015  . Slurred speech 03/05/2015  . Anxiety 12/19/2013  . Parkinson's disease (HCC) 08/06/2013  . Dyslipidemia 08/06/2013  . S/P mitral valve repair 08/06/2013    [facility] 08/10/2016, 10:16 PM  [facility] [address] [place], [place], [address] Phone: [phone]   Fax:  [phone]  Name: [name] [name] MRN: [id number] Date of Birth: 27-Feb-1932   Willa Frater, OTR/L [facility] [address] [place], [place]  [address] [phone] phone [phone] 08/10/16 10:16 PM

## 2016-08-11 NOTE — Therapy (Signed)
[facility] [address] [place], [place], [address] Phone: [phone]   Fax:  [phone]  Physical Therapy Evaluation  Patient Details  Name: [name] HensenJohn [name] MRN: [id number] Date of Birth: 11/22/1931 Referring Provider: Waynard EdwardsPerini  Encounter Date: 08/10/2016      PT End of Session - 08/11/16 0830    Visit Number 1   Number of Visits 17   Date for PT Re-Evaluation 10/09/16   Authorization Type [employer] Medicare-GCODE every 10th visit   PT Start Time 0933   PT Stop Time 1015   PT Time Calculation (min) 42 min   Activity Tolerance Patient tolerated treatment well   [facility] for tasks assessed/performed      Past Medical History:  Diagnosis Date  . Dyslipidemia   . Parkinson's disease (HCC)   . S/P mitral valve replacement    mitral regurg, endocarditis  . Stroke [facility](HCC)     Past Surgical History:  Procedure Laterality Date  . CATARACT EXTRACTION  2009, 2012   right 2009, left 2012  . LOOP RECORDER IMPLANT Left 03/13/2015   Procedure: LOOP RECORDER IMPLANT;  Surgeon: Marinus MawGregg W Taylor, MD;  [facility];  Service: Cardiovascular;  Laterality: Left;  . 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 TEE WITHOUT CARDIOVERSION N/A 03/13/2015   Procedure: TRANSESOPHAGEAL ECHOCARDIOGRAM (TEE);  Surgeon: Laurey Moralealton S McLean, MD;  [facility];  Service: Cardiovascular;  Laterality: N/A;  . TRANSTHORACIC ECHOCARDIOGRAM  08/15/2012   EF=>55%; normal LV systolic function; RV mildly dilated & systolic function mildly reduced; RA mod dilated; mild -mod MR & mildy increased gradients; mild TR; AV mildly sclerotic with mild-mod regurg    There were no vitals filed for this visit.       Subjective Assessment - 08/10/16 0937    Subjective Pt is an 82 year old male with history of Parkinson's disease.  No recent falls, but 6  falls in the past year.  (2 falls were stepping off of the curb).  Mostly uses cane for walking.  Wife reports the stutter steps typically come on when he is walking fast, and then causes tripping.   Patient is accompained by: Family member  Wife-Stephanie   Patient Stated Goals Pt's goals are to help with the stutter steps.   Currently in Pain? No/denies            [facility] Assessment - 08/10/16 0941      Assessment   Medical Diagnosis Parkinson's disease   Referring Provider Perini   Onset Date/Surgical Date --  07/01/16 PD screen     Precautions   Precautions Fall     Balance Screen   Has the patient fallen in the past 6 months Yes   How many times[place] 3  [place]   Has the patient had a decrease in activity level because of a fear of falling?  No   Is the patient reluctant to leave their home because of a fear of falling?  No     Home Nurse, mental healthnvironment   Living Environment Private residence   Living Arrangements Spouse/significant other   Available Help at Discharge Family   Type of Home House   Home Access Stairs to enter   Entrance Stairs-Number of Steps 1   Entrance Stairs-Rails None   Home Layout Able to live on main  level with bedroom/bathroom   Home Equipment Cane - single point   [place], where several falls have occurred     Prior Function   Level of Independence Independent with basic ADLs;Independent with household mobility with device;Independent with community mobility with device   Leisure Participates in [employer] exercise class once per week; does self stretching and exercises at home; walks everyday in neighborhood with walking     Posture/Postural Control   Posture/Postural Control Postural limitations   Postural Limitations Rounded Shoulders;Forward head;Posterior pelvic tilt  Dyskinesias noted in sitting     ROM / Strength   AROM / PROM / Strength Strength      Strength   Overall Strength Within functional limits for tasks performed   Overall Strength Comments Grossly tested bilateral lower extremities, at least 4/5     Transfers   Transfers Sit to Stand;Stand to Sit   Sit to Stand 5: Supervision;Without upper extremity assist;From chair/3-in-1   Five time sit to stand comments  8.90  knees flexed in standing   Stand to Sit 6: Modified independent (Device/Increase time);Without upper extremity assist;To chair/3-in-1     Ambulation/Gait   Ambulation/Gait Yes   Ambulation/Gait Assistance 5: Supervision;6: Modified independent (Device/Increase time)   Ambulation Distance (Feet) 200 Feet   Assistive device None  Did not bring cane to eval today   Gait Pattern Step-through pattern;Decreased arm swing - right;Decreased arm swing - left;Trunk flexed;Narrow base of support;Festinating  festinating steps with turns   Ambulation Surface Level;Indoor   Gait velocity 8.95 sec = 3.66 ft/sec     Standardized Balance Assessment   Standardized Balance Assessment Timed Up and Go Test     Timed Up and Go Test   Normal TUG (seconds) 10.48   Manual TUG (seconds) 11.98   Cognitive TUG (seconds) 10.09   TUG Comments Scores >13.5 seconds indicate increased fall risk; Scores >10% diffierence in TUG and TUG Cog/man indicate difficulty with dual tasking     High Level Balance   High Level Balance Comments MiniBesttest score 17/28         Mini-BESTest: Balance Evaluation Systems Test  2005-2013 [facility] [employer]. All rights reserved. ________________________________________________________________________________________Anticipatory_________Subscore___3__/6 1. SIT TO STAND Instruction: "Cross your arms across your chest. Try not to use your hands unless you must.Do not let your legs lean against the back of the chair when you stand. Please stand up now." X(2) Normal: Comes to stand without use of hands and stabilizes independently. (1)  Moderate: Comes to stand WITH use of hands on first attempt. (0) Severe: Unable to stand up from chair without assistance, OR needs several attempts with use of hands. 2. RISE TO TOES Instruction: "Place your feet shoulder width apart. Place your hands on your hips. Try to rise as high as you can onto your toes. I will count out loud to 3 seconds. Try to hold this pose for at least 3 seconds. Look straight ahead. Rise now." (2) Normal: Stable for 3 s with maximum height. (1) Moderate: Heels up, but not full range (smaller than when holding hands), OR noticeable instability for 3 s. X(0) Severe: < 3 s. 3. STAND ON ONE LEG Instruction: "Look straight ahead. Keep your hands on your hips. Lift your leg off of the ground behind you without touching or resting your raised leg upon your other standing leg. Stay standing on one leg as long as you can. Look straight  ahead. Lift now." Left: Time in Seconds Trial 1:___9 sec__Trial 2:__1.68 sec___ (2) Normal: 20 s. X(1) Moderate: < 20 s. (0) Severe: Unable. Right: Time in Seconds Trial 1:__1.8 sec___Trial 2:__2.12 sec___ (2) Normal: 20 s. X(1) Moderate: < 20 s. (0) Severe: Unable To score each side separately use the trial with the longest time. To calculate the sub-score and total score use the side [left or right] with the lowest numerical score [i.e. the worse side]. ______________________________________________________________________________________Reactive Postural Control___________Subscore:__4___/6 4. COMPENSATORY STEPPING CORRECTION- FORWARD Instruction: "Stand with your feet shoulder width apart, arms at your sides. Lean forward against my hands beyond your forward limits. When I let go, do whatever is necessary, including taking a step, to avoid a fall." X(2) Normal: Recovers independently with a single, large step (second realignment step is allowed). (1) Moderate: More than one step used to recover equilibrium. (0) Severe: No step, OR  would fall if not caught, OR falls spontaneously. 5. COMPENSATORY STEPPING CORRECTION- BACKWARD Instruction: "Stand with your feet shoulder width apart, arms at your sides. Lean backward against my hands beyond your backward limits. When I let go, do whatever is necessary, including taking a step, to avoid a fall." (2) Normal: Recovers independently with a single, large step. (1) Moderate: More than one step used to recover equilibrium. X(0) Severe: No step, OR would fall if not caught, OR falls spontaneously. 6. COMPENSATORY STEPPING CORRECTION- LATERAL Instruction: "Stand with your feet together, arms down at your sides. Lean into my hand beyond your sideways limit. When I let go, do whatever is necessary, including taking a step, to avoid a fall." Left X(2) Normal: Recovers independently with 1 step (crossover or lateral OK). (1) Moderate: Several steps to recover equilibrium. (0) Severe: Falls, or cannot step. Right X(2) Normal: Recovers independently with 1 step (crossover or lateral OK). (1) Moderate: Several steps to recover equilibrium. (0) Severe: Falls, or cannot step. Use the side with the lowest score to calculate sub-score and total score. ____________________________________________________________________________________Sensory Orientation_____________Subscore:_____5____/6 7. STANCE (FEET TOGETHER); EYES OPEN, FIRM SURFACE Instruction: "Place your hands on your hips. Place your feet together until almost touching. Look straight ahead. Be as stable and still as possible, until I say stop." Time in seconds:________ X(2) Normal: 30 s. (1) Moderate: < 30 s. (0) Severe: Unable. 8. STANCE (FEET TOGETHER); EYES CLOSED, FOAM SURFACE Instruction: "Step onto the foam. Place your hands on your hips. Place your feet together until almost touching. Be as stable and still as possible, until I say stop. I will start timing when you close your eyes." Time in seconds:________ X(2)  Normal: 30 s. (1) Moderate: < 30 s. (0) Severe: Unable. 9. INCLINE- EYES CLOSED Instruction: "Step onto the incline ramp. Please stand on the incline ramp with your toes toward the top. Place your feet shoulder width apart and have your arms down at your sides. I will start timing when you close your eyes." Time in seconds:________ (2) Normal: Stands independently 30 s and aligns with gravity. X(1) Moderate: Stands independently <30 s OR aligns with surface. (0) Severe: Unable. _________________________________________________________________________________________Dynamic Gait ______Subscore___6___/10 10. CHANGE IN GAIT SPEED Instruction: "Begin walking at your normal speed, when I tell you 'fast', walk as fast as you can. When I say 'slow', walk very slowly." X(2) Normal: Significantly changes walking speed without imbalance. (1) Moderate: Unable to change walking speed or signs of imbalance. (0) Severe: Unable to achieve significant change in walking speed AND signs of imbalance. 11. WALK WITH HEAD TURNS - HORIZONTAL Instruction: "Begin  walking at your normal speed, when I say "right", turn your head and look to the right. When I say "left" turn your head and look to the left. Try to keep yourself walking in a straight line." (2) Normal: performs head turns with no change in gait speed and good balance. X(1) Moderate: performs head turns with reduction in gait speed. (0) Severe: performs head turns with imbalance. 12. WALK WITH PIVOT TURNS Instruction: "Begin walking at your normal speed. When I tell you to 'turn and stop', turn as quickly as you can, face the opposite direction, and stop. After the turn, your feet should be close together." (2) Normal: Turns with feet close FAST (< 3 steps) with good balance. (1) Moderate: Turns with feet close SLOW (>4 steps) with good balance. X(0) Severe: Cannot turn with feet close at any speed without imbalance. 13. STEP OVER  OBSTACLES Instruction: "Begin walking at your normal speed. When you get to the box, step over it, not around it and keep walking." (2) Normal: Able to step over box with minimal change of gait speed and with good balance. X(1) Moderate: Steps over box but touches box OR displays cautious behavior by slowing gait. (0) Severe: Unable to step over box OR steps around box. 14. TIMED UP & GO WITH DUAL TASK [3 METER WALK] Instruction TUG: "When I say 'Go', stand up from chair, walk at your normal speed across the tape on the floor, turn around, and come back to sit in the chair." Instruction TUG with Dual Task: "Count backwards by threes starting at ___. When I say 'Go', stand up from chair, walk at your normal speed across the tape on the floor, turn around, and come back to sit in the chair. Continue counting backwards the entire time." TUG: ________seconds; Dual Task TUG: ________seconds (2) Normal: No noticeable change in sitting, standing or walking while backward counting when compared to TUG without Dual Task. X(1) Moderate: Dual Task affects either counting OR walking (>10%) when compared to the TUG without Dual Task. (0) Severe: Stops counting while walking OR stops walking while counting. When scoring item 14, if subject's gait speed slows more than 10% between the TUG without and with a Dual Task the score should be decreased by a point. TOTAL SCORE: ___17_____/28                     PT Short Term Goals - 08/11/16 0835      PT SHORT TERM GOAL #1   Title Pt will perform HEP for improved posture, balance, and gait with wife's supervision.  TARGET 09/09/16   Time 4   Period Weeks   Status New     PT SHORT TERM GOAL #2   Title Pt will perform at least 8 of 10 reps of sit<>stand transfers with proper, safe technique for improved efficiency and safety with transfers.   Time 4   Period Weeks   Status New     PT SHORT TERM GOAL #3   Title Pt will  verbalize/demonstrate understanding of tips to reduce festinating and freezing with gait.   Time 4   Period Weeks   Status New     PT SHORT TERM GOAL #4   Title Pt will improve TUG and TUG cognitive to within 10% to demonstrate improved ability for dual tasking.   Time 4   Period Weeks   Status New           PT Long Term  Goals - 08/11/16 1610      PT LONG TERM GOAL #1   Title Pt/wife will verbalize understanding of fall prevention within home environment.  TARGET 10/09/16   Time 8   Period Weeks   Status New     PT LONG TERM GOAL #2   Title Pt will improve MiniBESTest to at least 20/28 for improved balance/decreased fall risk.   Time 8   Period Weeks   Status New     PT LONG TERM GOAL #3   Title Pt will demonstrate improved ability for dual tasking by ambulating at least 500 ft on indoor/outdoor surfaces, while carrying items, modified independently.   Time 8   Period Weeks   Status New     PT LONG TERM GOAL #4   Title Pt will negotiate curb step modified independently, no LOB, 5 of 5 trials, for improved curb negotiation.   Time 8   Period Weeks   Status New     PT LONG TERM GOAL #5   Title Pt will verbalize plans for continued community fitness upon D/C from PT.   Time 8   Period Weeks   Status New               Plan - 08/11/16 0831    Clinical Impression Statement Pt is an 82 year old male who presents to OP PT with history of Parkinson's disease.  He has history of CVA, dyslipidemia and mitral valve replacement.  He has history of at least 6 falls in the past 6-12 months.  He presents to OP PT with decreased timing and coordination with gait, decreased balance, decreased functional strength and ability for efficiency with transfers, decreased ability for dual tasking with gait, abnormal posture, festinating/hastening with gait and turns.  Pt is at increased fall risk per MiniBESTest.  Pt would benefit from skilled physical therapy to address the above  stated deficits and to decrease fall risk.   Rehab Potential Good   PT Frequency 2x / week   PT Duration 8 weeks  plus eval   PT Treatment/Interventions ADLs/Self Care Home Management;Functional mobility training;Gait training;DME Instruction;Therapeutic activities;Therapeutic exercise;Balance training;Neuromuscular re-education;Patient/family education   PT Next Visit Plan tips to reduce freezing/hastening with gait; work on standing weigthshifting, posture and initiation of gait incorporating PWR! Moves in standing; transfer technique   Consulted and Agree with Plan of Care Patient;Family member/caregiver   Family Member Consulted wife-Stephanie      Patient will benefit from skilled therapeutic intervention in order to improve the following deficits and impairments:  Abnormal gait, Decreased balance, Decreased mobility, Decreased safety awareness, Decreased strength, Difficulty walking, Postural dysfunction  Visit Diagnosis: Other abnormalities of gait and mobility  Other symptoms and signs involving the nervous system  Abnormal posture  Unsteadiness on feet      G-Codes - 08-18-2016 0842    Functional Assessment Tool Used TUG 10.48 sec, TUG cog 11.98 sec, 5x sit<>stand 8.9 sec with knees flexed; 18/28 on MiniBESTest; 6 falls in past 6-12 months   Functional Limitation Mobility: Walking and moving around   Mobility: Walking and Moving Around Current Status [phone]) At least 40 percent but less than 60 percent impaired, limited or restricted   Mobility: Walking and Moving Around Goal Status [phone]) At least 20 percent but less than 40 percent impaired, limited or restricted       Problem List Patient Active Problem List   Diagnosis Date Noted  . Encounter for therapeutic drug monitoring 01/05/2016  .  Paroxysmal atrial fibrillation (HCC) 12/24/2015  . 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 HLD (hyperlipidemia) 06/02/2015  . PD  (Parkinson's disease) (HCC) 06/02/2015  . Mitral regurgitation 03/17/2015  . CVA (cerebral infarction)   . TIA (transient ischemic attack) 03/05/2015  . Slurred speech 03/05/2015  . Anxiety 12/19/2013  . Parkinson's disease (HCC) 08/06/2013  . Dyslipidemia 08/06/2013  . S/P mitral valve repair 08/06/2013    [name] [name] W. 08/11/2016, 8:43 AM Gean Maidens., PT [facility] [address] [place], [place], [address] Phone: [phone]   Fax:  [phone]  Name: [name] [name] MRN: [id number] Date of Birth: 1932-11-18

## 2016-08-19 ENCOUNTER — Ambulatory Visit: Payer: Medicare Other

## 2016-08-24 ENCOUNTER — Other Ambulatory Visit: Payer: Self-pay | Admitting: Cardiology

## 2016-08-24 ENCOUNTER — Ambulatory Visit: Payer: Medicare Other | Admitting: Occupational Therapy

## 2016-08-26 ENCOUNTER — Encounter: Payer: Medicare Other | Admitting: Occupational Therapy

## 2016-08-26 ENCOUNTER — Ambulatory Visit: Payer: Medicare Other | Admitting: Physical Therapy

## 2016-08-28 LAB — CUP PACEART REMOTE DEVICE CHECK: MDC IDC SESS DTM: 20170913180910

## 2016-08-28 NOTE — Progress Notes (Signed)
Carelink summary report received. Battery status OK. Normal device function. No new symptom episodes, tachy episodes, brady, or pause episodes. 1 AF episode, SR.  Monthly summary reports and ROV/PRN

## 2016-08-31 ENCOUNTER — Encounter: Payer: Medicare Other | Admitting: Occupational Therapy

## 2016-09-03 ENCOUNTER — Ambulatory Visit (INDEPENDENT_AMBULATORY_CARE_PROVIDER_SITE_OTHER): Payer: Medicare Other | Admitting: *Deleted

## 2016-09-03 DIAGNOSIS — I63412 Cerebral infarction due to embolism of left middle cerebral artery: Secondary | ICD-10-CM | POA: Diagnosis not present

## 2016-09-06 NOTE — Progress Notes (Signed)
Carelink Summary Report / Loop Recorder 

## 2016-09-08 ENCOUNTER — Other Ambulatory Visit: Payer: Self-pay | Admitting: Cardiology

## 2016-09-08 ENCOUNTER — Ambulatory Visit (INDEPENDENT_AMBULATORY_CARE_PROVIDER_SITE_OTHER): Payer: Medicare Other | Admitting: Pharmacist

## 2016-09-08 DIAGNOSIS — I63412 Cerebral infarction due to embolism of left middle cerebral artery: Secondary | ICD-10-CM | POA: Diagnosis not present

## 2016-09-08 DIAGNOSIS — Z5181 Encounter for therapeutic drug level monitoring: Secondary | ICD-10-CM

## 2016-09-08 DIAGNOSIS — Z9889 Other specified postprocedural states: Secondary | ICD-10-CM

## 2016-09-08 DIAGNOSIS — I480 Paroxysmal atrial fibrillation: Secondary | ICD-10-CM | POA: Diagnosis not present

## 2016-09-08 DIAGNOSIS — G458 Other transient cerebral ischemic attacks and related syndromes: Secondary | ICD-10-CM

## 2016-09-08 LAB — POCT INR: INR: 2.3

## 2016-09-13 ENCOUNTER — Ambulatory Visit: Payer: Medicare Other | Admitting: Physical Therapy

## 2016-09-13 ENCOUNTER — Ambulatory Visit: Payer: Medicare Other | Attending: Internal Medicine | Admitting: Occupational Therapy

## 2016-09-13 DIAGNOSIS — R2689 Other abnormalities of gait and mobility: Secondary | ICD-10-CM | POA: Diagnosis present

## 2016-09-13 DIAGNOSIS — R293 Abnormal posture: Secondary | ICD-10-CM | POA: Diagnosis present

## 2016-09-13 DIAGNOSIS — R2681 Unsteadiness on feet: Secondary | ICD-10-CM | POA: Diagnosis present

## 2016-09-13 DIAGNOSIS — R41840 Attention and concentration deficit: Secondary | ICD-10-CM | POA: Insufficient documentation

## 2016-09-13 DIAGNOSIS — R29898 Other symptoms and signs involving the musculoskeletal system: Secondary | ICD-10-CM | POA: Diagnosis present

## 2016-09-13 DIAGNOSIS — R29818 Other symptoms and signs involving the nervous system: Secondary | ICD-10-CM | POA: Insufficient documentation

## 2016-09-13 DIAGNOSIS — R278 Other lack of coordination: Secondary | ICD-10-CM | POA: Diagnosis present

## 2016-09-13 DIAGNOSIS — R41842 Visuospatial deficit: Secondary | ICD-10-CM | POA: Diagnosis present

## 2016-09-13 NOTE — Patient Instructions (Signed)
Sit to Stand Transfers:  1. Scoot out to the edge of the chair 2. Place your feet flat on the floor, shoulder width apart.  Make sure your feet are tucked just under your knees. 3. Lean forward (nose over toes) with momentum, and stand up tall with your best posture.  If you need to use your arms, use them as a quick boost up to stand. 4. If you are in a low or soft chair, you can lean back and then forward up to stand, in order to get more momentum. 5. Once you are standing, make sure you are looking ahead and standing tall.  To sit down:  1. Back up until you feel the chair behind your legs. 2. Bend at you hips, reaching  Back for you chair, if needed, then slowly squat to sit down on your chair.  ONCE YOU STAND UP WITH YOUR FEET WIDE, YOU WILL SHIFT YOUR WEIGHT SIDE TO SIDE UNTIL YOU ARE READY TO TAKE A BIG STEP TO START WALKING.  Tips to reduce freezing episodes with standing or walking:  6. Stand tall with your feet wide, so that you can rock and weight shift through your hips. 7. Don't try to fight the freeze: if you begin taking slower, faster, smaller steps, STOP, get your posture tall, and RESET your posture and balance.  Take a deep breath before taking the BIG step to start again. 8. March in place, with high knee stepping, to get started walking again. 9. Use auditory cues:  Count out loud, think of a familiar tune or song or cadence, use pocket metronome, to use rhythm to get started walking again. 10. Use visual cues:  Use a line to step over, use laser pointer line to step over, (using BIG steps) to start walking again. 11. Use visual targets to keep your posture tall (look ahead and focus on an object or target at eye level). 12. As you approach where your destination with walking, count your steps out loud and/or focus on your target with your eyes until you are fully there. 13. Use appropriate assistive device, as advised by your physical therapist to assist with taking  longer, consistent steps.

## 2016-09-13 NOTE — Patient Instructions (Addendum)
Coordination Exercises  Perform the following exercises for 20 minutes 1 times per day/every other day. Perform with both hand(s). Perform using big movements.   Flipping Cards: Place deck of cards on the table. Flip cards over by opening your hand big to grasp and then turn your palm up big, opening hand fully to release.  Deal cards: Hold 1/2 or whole deck in your hand. Use thumb to push card off top of deck with one big push.  Place card on tabletop. Then flick fingers (extend fingers) powerfully to slide card off table (can have chair/box below table to catch the cards).  Rotate ball with fingertips: Pick up with fingers/thumb and move as much as you can with each turn/movement (clockwise and counter-clockwise).  Pick up coins and stack one at a time: Pick up with big, intentional movements. Do not drag coin to the edge. (5-10 in a stack)  Pick up 5-10 coins one at a time and hold in palm. Then, move coins from palm to fingertips one at time and place in coin bank/container.  Practice writing: Slow down, write big, and focus on forming each letter.  **  PWR! Hands: Push hands out BIG. Elbows straight, wrists up, fingers open and spread apart BIG. Hold 2sec.  Perform at least 10 times 1x/day and anytime you are having difficulty using your hands (buttoning, writing, eating)

## 2016-09-13 NOTE — Therapy (Signed)
[facility] [address] [place], [place], [address] Phone: [phone]   Fax:  [phone]  Occupational Therapy Treatment  Patient Details  Name: [name] [name] MRN: [id number] Date of Birth: Feb 06, 1932 Referring Provider: Dr. Rodrigo Ran  Encounter Date: 09/13/2016      OT End of Session - 09/13/16 0949    Visit Number 2   Number of Visits 12   Date for OT Re-Evaluation 10/09/16   Authorization Type UHC Medicare, no visit limit/auth, need G-code   Authorization Time Period cert. date 09/13/16-11/12/16   Authorization - Visit Number 2   Authorization - Number of Visits 10   OT Start Time 0935   OT Stop Time 1015   OT Time Calculation (min) 40 min   Activity Tolerance Patient tolerated treatment well   Behavior During Therapy [facility] for tasks assessed/performed      Past Medical History:  Diagnosis Date  . Dyslipidemia   . Parkinson's disease (HCC)   . S/P mitral valve replacement    mitral regurg, endocarditis  . Stroke [facility])     Past Surgical History:  Procedure Laterality Date  . CATARACT EXTRACTION  2009, 2012   right 2009, left 2012  . LOOP RECORDER IMPLANT Left 03/13/2015   Procedure: LOOP RECORDER IMPLANT;  Surgeon: Marinus Maw, MD;  Location: [facility] CATH LAB;  Service: Cardiovascular;  Laterality: Left;  . 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 TEE WITHOUT CARDIOVERSION N/A 03/13/2015   Procedure: TRANSESOPHAGEAL ECHOCARDIOGRAM (TEE);  Surgeon: Laurey Morale, MD;  Location: [facility] ENDOSCOPY;  Service: Cardiovascular;  Laterality: N/A;  . TRANSTHORACIC ECHOCARDIOGRAM  08/15/2012   EF=>55%; normal LV systolic function; RV mildly dilated & systolic function mildly reduced; RA mod dilated; mild -mod MR & mildy increased gradients; mild TR; AV mildly sclerotic with mild-mod regurg    There were no vitals filed for this  visit.      Subjective Assessment - 09/13/16 0939    Subjective  Pt reports no new falls   Patient Stated Goals improve coordination, balance   Currently in Pain? No/denies      Attempted tossing/catching ball between hands and tossing/catching in same hand but due to mod-max difficulty/drops, did not issue at home due to fall risk with retrieving ball.  Attempted rotating 2 small balls in each hand, max difficulty, mod cues.  Practiced writing name/address and 3 sentences with good legibility and size (in cursive).                          OT Education - 09/13/16 [phone]    Education Details PD Coordination HEP (focus on large amplitude)   Person(s) Educated Patient   Methods Explanation;Demonstration;Verbal cues;Handout   Comprehension Verbalized understanding;Returned demonstration;Verbal cues required  min cues for large amplitude movements          OT Short Term Goals - 08/10/16 2206      OT SHORT TERM GOAL #1   Title -------------------   Time --     OT SHORT TERM GOAL #2   Title -----------------     OT SHORT TERM GOAL #3   Title -----------------     OT SHORT TERM GOAL #4   Title ------------------           OT Long Term Goals - 09/13/16 1014  OT LONG TERM GOAL #1   Title Pt will verbalize understanding of strategies/AE to assist with ADLs/IADLs prn.--check LTGs 01/11/16   Time 4   Period Weeks   Status New     OT LONG TERM GOAL #2   Title Pt will improve coordination for ADLs as shown by improving time on 9-hole peg test by at least 4sec with RUE.   Baseline 37.72   Time 4   Period Weeks   Status New     OT LONG TERM GOAL #3   Title Pt will improve UE functional reaching/coordination as shown by improving score on box and blocks test by at least 4 bilaterally.   Time 4   Period Weeks   Status New     OT LONG TERM GOAL #4   Title Pt will be perform updated HEP with min cues.   Time 4   Period Weeks   Status New      OT LONG TERM GOAL #5   Title Assess handwriting and set goal prn   Time 4   Period Weeks   Status Deferred  good size and legibility               Plan - 09/13/16 0949    Clinical Impression Statement Pt progressing towards goals as he returned demo coordination HEP with min cues for large amplitude movements and timing of movement.   Rehab Potential Good   OT Frequency 2x / week   OT Duration 4 weeks  +eval, anticipate delayed start due to vacation/schedule conflicts   OT Treatment/Interventions Self-care/ADL training;Cryotherapy;Parrafin;Therapeutic exercise;DME and/or AE instruction;Building services engineer;Therapeutic activities;Patient/family education;Balance training;Cognitive remediation/compensation;Splinting;Manual Therapy;Neuromuscular education;Fluidtherapy;Ultrasound;Moist Heat;Energy conservation;Passive range of motion;Therapeutic exercises;Visual/perceptual remediation/compensation   Plan strategies for ADLs, continue with coordination   (Continue with current plan of care with new cert. period as pt has not been seen since eval due to going out of town/scheduling conflicts).   Consulted and Agree with Plan of Care Family member/caregiver;Patient   Family Member Consulted wife      Patient will benefit from skilled therapeutic intervention in order to improve the following deficits and impairments:  Decreased balance, Abnormal gait, Decreased mobility, Difficulty walking, Impaired vision/preception, Impaired tone, Decreased cognition, Decreased activity tolerance, Decreased coordination, Decreased knowledge of use of DME, Impaired UE functional use, Improper spinal/pelvic alignment (bradykinesia, dyskinesias)  Visit Diagnosis: Other symptoms and signs involving the nervous system  Attention and concentration deficit  Other symptoms and signs involving the musculoskeletal system  Other lack of coordination  Visuospatial deficit  Other abnormalities of  gait and mobility    Problem List Patient Active Problem List   Diagnosis Date Noted  . Encounter for therapeutic drug monitoring 01/05/2016  . Paroxysmal atrial fibrillation (HCC) 12/24/2015  . 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 HLD (hyperlipidemia) 06/02/2015  . PD (Parkinson's disease) (HCC) 06/02/2015  . Mitral regurgitation 03/17/2015  . CVA (cerebral infarction)   . TIA (transient ischemic attack) 03/05/2015  . Slurred speech 03/05/2015  . Anxiety 12/19/2013  . Parkinson's disease (HCC) 08/06/2013  . Dyslipidemia 08/06/2013  . S/P mitral valve repair 08/06/2013    [facility] 09/13/2016, 10:48 AM  [facility] [address] [place], [place], [address] Phone: [phone]   Fax:  [phone]  Name: [name] [name] MRN: [id number] Date of Birth: Mar 02, 1932   Willa Frater, OTR/L [facility] [address] [place], [place]  [address] [phone]  phone [phone] 09/13/16 10:48 AM

## 2016-09-14 ENCOUNTER — Encounter: Payer: Medicare Other | Admitting: Occupational Therapy

## 2016-09-14 ENCOUNTER — Ambulatory Visit: Payer: Medicare Other | Admitting: Physical Therapy

## 2016-09-14 NOTE — Therapy (Signed)
[facility] [address] [place], [place], [address] Phone: [phone]   Fax:  [phone]  Physical Therapy Treatment  Patient Details  Name: [name] [name] MRN: [id number] Date of Birth: 1932/08/03 Referring Provider: Waynard Edwards  Encounter Date: 09/13/2016      PT End of Session - 09/14/16 1423    Visit Number 2   Number of Visits 17   Date for PT Re-Evaluation 11/12/16  per recert 09/13/16   Authorization Type UHC Medicare-GCODE every 10th visit   PT Start Time 1020   PT Stop Time 1059   PT Time Calculation (min) 39 min   Activity Tolerance Patient tolerated treatment well   Behavior During Therapy [facility] for tasks assessed/performed      Past Medical History:  Diagnosis Date  . Dyslipidemia   . Parkinson's disease (HCC)   . S/P mitral valve replacement    mitral regurg, endocarditis  . Stroke [facility])     Past Surgical History:  Procedure Laterality Date  . CATARACT EXTRACTION  2009, 2012   right 2009, left 2012  . LOOP RECORDER IMPLANT Left 03/13/2015   Procedure: LOOP RECORDER IMPLANT;  Surgeon: Marinus Maw, MD;  Location: [facility] CATH LAB;  Service: Cardiovascular;  Laterality: Left;  . 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 TEE WITHOUT CARDIOVERSION N/A 03/13/2015   Procedure: TRANSESOPHAGEAL ECHOCARDIOGRAM (TEE);  Surgeon: Laurey Morale, MD;  Location: [facility] ENDOSCOPY;  Service: Cardiovascular;  Laterality: N/A;  . TRANSTHORACIC ECHOCARDIOGRAM  08/15/2012   EF=>55%; normal LV systolic function; RV mildly dilated & systolic function mildly reduced; RA mod dilated; mild -mod MR & mildy increased gradients; mild TR; AV mildly sclerotic with mild-mod regurg    There were no vitals filed for this visit.      Subjective Assessment - 09/13/16 1022    Subjective Has not been back in since eval due to waiting for all therapies to  be scheduled together and due to pt/wife's scheduling needs.  Reports no falls.   Patient is accompained by: Family member   Patient Stated Goals Pt's goals are to help with the stutter steps.   Currently in Pain? No/denies                         [facility] Adult PT Treatment/Exercise - 09/13/16 1023      Transfers   Transfers Sit to Stand;Stand to Sit   Sit to Stand 5: Supervision   Sit to Stand Details (indicate cue type and reason) Cues for foot placement, scooting forward, increased forward lean and upright standing posture upon standing.   Stand to Sit 5: Supervision   Number of Reps 10 reps;Other sets (comment)  each from 24", 20", 18" surfaces   Comments Practiced initiation of gait upon standing with lateral weightshifting 3-5 times prior to taking first big, deliberate step     Ambulation/Gait   Ambulation/Gait Yes   Ambulation/Gait Assistance 5: Supervision;6: Modified independent (Device/Increase time)   Ambulation/Gait Assistance Details Practiced negotiation of doorways, per patient request, as that is an area where he experiences freezing or festinating.  Discussed and practices focusing ahead on visual target prior to walking through the doorway, at least 3 reps, then practiced negotiating narrow spaces in the gym using same technique.   Ambulation Distance (Feet) 300 Feet   Assistive device None;Straight cane  Gait Pattern Step-through pattern;Decreased arm swing - right;Decreased arm swing - left;Trunk flexed;Narrow base of support;Festinating   Ambulation Surface Level;Unlevel   Pre-Gait Activities Provided education on tips to reduce freezing with gait   Gait Comments Practiced weightshifting at counter with wide BOS, with cues for reaching, then practiced weightshifting as stategy for turns.  Pt practices short distance turns x 5 reps, with freqent cues for weightshifting for improved foot clearance during turn.                PT Education -  09/14/16 1423    Education provided Yes   Education Details Sit<>stand transfer technique; tips to reduce freezing with gait   Person(s) Educated Patient   Methods Explanation;Demonstration;Verbal cues;Handout   Comprehension Verbalized understanding;Returned demonstration;Verbal cues required;Need further instruction          PT Short Term Goals - 09/14/16 1427      PT SHORT TERM GOAL #1   Title Pt will perform HEP for improved posture, balance, and gait with wife's supervision.  Updated TARGET 10/13/16   Time 4   Period Weeks   Status New     PT SHORT TERM GOAL #2   Title Pt will perform at least 8 of 10 reps of sit<>stand transfers with proper, safe technique for improved efficiency and safety with transfers.   Time 4   Period Weeks   Status New     PT SHORT TERM GOAL #3   Title Pt will verbalize/demonstrate understanding of tips to reduce festinating and freezing with gait.   Time 4   Period Weeks   Status New     PT SHORT TERM GOAL #4   Title Pt will improve TUG and TUG cognitive to within 10% to demonstrate improved ability for dual tasking.   Time 4   Period Weeks   Status New           PT Long Term Goals - 09/14/16 1427      PT LONG TERM GOAL #1   Title Pt/wife will verbalize understanding of fall prevention within home environment.  UPdated TARGET 11/12/16   Time 8   Period Weeks   Status New     PT LONG TERM GOAL #2   Title Pt will improve MiniBESTest to at least 20/28 for improved balance/decreased fall risk.   Time 8   Period Weeks   Status New     PT LONG TERM GOAL #3   Title Pt will demonstrate improved ability for dual tasking by ambulating at least 500 ft on indoor/outdoor surfaces, while carrying items, modified independently.   Time 8   Period Weeks   Status New     PT LONG TERM GOAL #4   Title Pt will negotiate curb step modified independently, no LOB, 5 of 5 trials, for improved curb negotiation.   Time 8   Period Weeks   Status  New     PT LONG TERM GOAL #5   Title Pt will verbalize plans for continued community fitness upon D/C from PT.   Time 8   Period Weeks   Status New               Plan - 09/14/16 1424    Clinical Impression Statement Pt returns to PT today for first visit since eval 08/11/16 (due to scheduling conflicts of pt/wife and PT/OT availability).  Renewal/recert to be completed today since it has been 4 weeks since eval and pt will continue to benefit  from work to address balance, gait, posture, transfers.  Pt verbalizes and demo improvement in transfers and initiation of gait with cueing and practice today.   Rehab Potential Good   PT Frequency 2x / week   PT Duration 8 weeks  per recert 09/13/16   PT Treatment/Interventions ADLs/Self Care Home Management;Functional mobility training;Gait training;DME Instruction;Therapeutic activities;Therapeutic exercise;Balance training;Neuromuscular re-education;Patient/family education   PT Next Visit Plan Review transfer technique, review tips to reduce freezing with gait; PWR! Moves in standing for weigthshifting and stepping exercises   Consulted and Agree with Plan of Care Patient      Patient will benefit from skilled therapeutic intervention in order to improve the following deficits and impairments:  Abnormal gait, Decreased balance, Decreased mobility, Decreased safety awareness, Decreased strength, Difficulty walking, Postural dysfunction  Visit Diagnosis: Other abnormalities of gait and mobility  Unsteadiness on feet  Abnormal posture  Other symptoms and signs involving the nervous system     Problem List Patient Active Problem List   Diagnosis Date Noted  . Encounter for therapeutic drug monitoring 01/05/2016  . Paroxysmal atrial fibrillation (HCC) 12/24/2015  . 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 HLD (hyperlipidemia) 06/02/2015  . PD (Parkinson's disease) (HCC) 06/02/2015  .  Mitral regurgitation 03/17/2015  . CVA (cerebral infarction)   . TIA (transient ischemic attack) 03/05/2015  . Slurred speech 03/05/2015  . Anxiety 12/19/2013  . Parkinson's disease (HCC) 08/06/2013  . Dyslipidemia 08/06/2013  . S/P mitral valve repair 08/06/2013    Theodore Rahrig W. 09/14/2016, 2:29 PM  Gean MaidensMARRIOTT,Quinterius Gaida W., PT  [place] [facility] [address] [place], [place], [address] Phone: [phone]   Fax:  [phone]  Name: [name] HensenJohn [name] MRN: [id number] Date of Birth: 09/11/1932

## 2016-09-16 ENCOUNTER — Encounter: Payer: Medicare Other | Admitting: Occupational Therapy

## 2016-09-16 ENCOUNTER — Ambulatory Visit: Payer: Medicare Other | Admitting: Physical Therapy

## 2016-09-21 ENCOUNTER — Ambulatory Visit: Payer: Medicare Other | Admitting: Occupational Therapy

## 2016-09-21 ENCOUNTER — Ambulatory Visit: Payer: Medicare Other | Admitting: Physical Therapy

## 2016-09-21 DIAGNOSIS — R29898 Other symptoms and signs involving the musculoskeletal system: Secondary | ICD-10-CM

## 2016-09-21 DIAGNOSIS — R278 Other lack of coordination: Secondary | ICD-10-CM

## 2016-09-21 DIAGNOSIS — R29818 Other symptoms and signs involving the nervous system: Secondary | ICD-10-CM

## 2016-09-21 DIAGNOSIS — R293 Abnormal posture: Secondary | ICD-10-CM

## 2016-09-21 DIAGNOSIS — R41840 Attention and concentration deficit: Secondary | ICD-10-CM

## 2016-09-21 DIAGNOSIS — R41842 Visuospatial deficit: Secondary | ICD-10-CM

## 2016-09-21 DIAGNOSIS — R2681 Unsteadiness on feet: Secondary | ICD-10-CM

## 2016-09-21 NOTE — Therapy (Signed)
[facility] [address] [place], [place], [address] Phone: [phone]   Fax:  [phone]  Physical Therapy Treatment  Patient Details  Name: [name] HensenJohn Broaden MRN: [id number] Date of Birth: 10/17/1932 Referring Provider: Waynard EdwardsPerini  Encounter Date: 09/21/2016      PT End of Session - 09/21/16 1019    Visit Number 3   Number of Visits 17   Date for PT Re-Evaluation 11/12/16  per recert 09/13/16   Authorization Type UHC Medicare-GCODE every 10th visit   PT Start Time 0933   PT Stop Time 1013   PT Time Calculation (min) 40 min   Activity Tolerance Patient tolerated treatment well  Several seated rest breaks during session   Behavior During Therapy [facility] for tasks assessed/performed      Past Medical History:  Diagnosis Date  . Dyslipidemia   . Parkinson's disease (HCC)   . S/P mitral valve replacement    mitral regurg, endocarditis  . Stroke [facility](HCC)     Past Surgical History:  Procedure Laterality Date  . CATARACT EXTRACTION  2009, 2012   right 2009, left 2012  . LOOP RECORDER IMPLANT Left 03/13/2015   Procedure: LOOP RECORDER IMPLANT;  Surgeon: Marinus MawGregg W Taylor, MD;  [facility];  Service: Cardiovascular;  Laterality: Left;  . 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 TEE WITHOUT CARDIOVERSION N/A 03/13/2015   Procedure: TRANSESOPHAGEAL ECHOCARDIOGRAM (TEE);  Surgeon: Laurey Moralealton S McLean, MD;  [facility];  Service: Cardiovascular;  Laterality: N/A;  . TRANSTHORACIC ECHOCARDIOGRAM  08/15/2012   EF=>55%; normal LV systolic function; RV mildly dilated & systolic function mildly reduced; RA mod dilated; mild -mod MR & mildy increased gradients; mild TR; AV mildly sclerotic with mild-mod regurg    There were no vitals filed for this visit.      Subjective Assessment - 09/21/16 0936    Subjective Have been practicing the  transfers and tips to reduce freezing walking through doorways.  Realize one of the things contributing to falls has been carrying mail.   Patient is accompained by: Family member   Patient Stated Goals Pt's goals are to help with the stutter steps.   Currently in Pain? No/denies                         [facility] Adult PT Treatment/Exercise - 09/21/16 0938      Transfers   Transfers Sit to Stand;Stand to Sit   Sit to Stand 6: Modified independent (Device/Increase time)   Sit to Stand Details (indicate cue type and reason) Cues for increased rocking, anterior/forward lean and use of momentum for low/soft surfaces such as sofa at home.   Stand to Sit 5: Supervision   Number of Reps 10 reps;Other sets (comment)  each, from 20", 18", 16", then 16" soft surface   Transfer Cueing Cues for upright posture (scapular squeezes and quad sets upon standing)   Comments Reviewed initiation of gait upon standing with lateral weightshifting 3-5 times prior to taking first big, deliberate step           PWR [facility] - 09/21/16 0953    PWR! exercises Moves in standing   PWR! Up x 10   PWR! Rock x 10 reps each side   PWR! Twist x 10 reps each side   PWR Step x 10 reps each leg, alternating  legs:  lateral, forward, posterior direction with UE support at counter as needed.  Verbal and visual cues provided for technique.   Comments Cues for technique, large and deliberate movement patterns          Balance Exercises - 09/21/16 1008      Balance Exercises: Standing   Stepping Strategy Anterior;Lateral;10 reps  Stepping over 2" obstacle each time for foot clearance   Retro Gait 5 reps  Near counter-forward/back gait   Sidestepping 5 reps;Cognitive challenge  Cues to take fewer/larger steps   Other Standing Exercises Lateral weightshift at counter x 12 reps, then stagger stance forward/back weightshift x 12 reps, then forward/back rock x 12 reps at counter             PT Short  Term Goals - 09/14/16 1427      PT SHORT TERM GOAL #1   Title Pt will perform HEP for improved posture, balance, and gait with wife's supervision.  Updated TARGET 10/13/16   Time 4   Period Weeks   Status New     PT SHORT TERM GOAL #2   Title Pt will perform at least 8 of 10 reps of sit<>stand transfers with proper, safe technique for improved efficiency and safety with transfers.   Time 4   Period Weeks   Status New     PT SHORT TERM GOAL #3   Title Pt will verbalize/demonstrate understanding of tips to reduce festinating and freezing with gait.   Time 4   Period Weeks   Status New     PT SHORT TERM GOAL #4   Title Pt will improve TUG and TUG cognitive to within 10% to demonstrate improved ability for dual tasking.   Time 4   Period Weeks   Status New           PT Long Term Goals - 09/14/16 1427      PT LONG TERM GOAL #1   Title Pt/wife will verbalize understanding of fall prevention within home environment.  UPdated TARGET 11/12/16   Time 8   Period Weeks   Status New     PT LONG TERM GOAL #2   Title Pt will improve MiniBESTest to at least 20/28 for improved balance/decreased fall risk.   Time 8   Period Weeks   Status New     PT LONG TERM GOAL #3   Title Pt will demonstrate improved ability for dual tasking by ambulating at least 500 ft on indoor/outdoor surfaces, while carrying items, modified independently.   Time 8   Period Weeks   Status New     PT LONG TERM GOAL #4   Title Pt will negotiate curb step modified independently, no LOB, 5 of 5 trials, for improved curb negotiation.   Time 8   Period Weeks   Status New     PT LONG TERM GOAL #5   Title Pt will verbalize plans for continued community fitness upon D/C from PT.   Time 8   Period Weeks   Status New               Plan - 09/21/16 1020    Clinical Impression Statement PT feels he is using strategies from previous PT session, and feels better about sit<>stand and negotiating doorways  and narrow spaces.  Pt continues to need cues to slow pace of movement patterns with weightshifting, change of directions and gait.  Pt will continue to benefit from further skilled PT to address  balance, posture, and gait.   Rehab Potential Good   PT Frequency 2x / week   PT Duration 8 weeks  per recert 09/13/16   PT Treatment/Interventions ADLs/Self Care Home Management;Functional mobility training;Gait training;DME Instruction;Therapeutic activities;Therapeutic exercise;Balance training;Neuromuscular re-education;Patient/family education   PT Next Visit Plan  PWR! Moves in standing for weigthshifting and stepping exercises, gait activities   Consulted and Agree with Plan of Care Patient      Patient will benefit from skilled therapeutic intervention in order to improve the following deficits and impairments:  Abnormal gait, Decreased balance, Decreased mobility, Decreased safety awareness, Decreased strength, Difficulty walking, Postural dysfunction  Visit Diagnosis: Abnormal posture  Unsteadiness on feet     Problem List Patient Active Problem List   Diagnosis Date Noted  . Encounter for therapeutic drug monitoring 01/05/2016  . Paroxysmal atrial fibrillation (HCC) 12/24/2015  . 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 HLD (hyperlipidemia) 06/02/2015  . PD (Parkinson's disease) (HCC) 06/02/2015  . Mitral regurgitation 03/17/2015  . CVA (cerebral infarction)   . TIA (transient ischemic attack) 03/05/2015  . Slurred speech 03/05/2015  . Anxiety 12/19/2013  . Parkinson's disease (HCC) 08/06/2013  . Dyslipidemia 08/06/2013  . S/P mitral valve repair 08/06/2013    Andriel Omalley W. 09/21/2016, 10:25 AM  Gean Maidens., PT  [facility] [address] [place], [place], [address] Phone: [phone]   Fax:  [phone]  Name: Lenon Kuennen MRN: [id number] Date of Birth:  10/15/1932

## 2016-09-21 NOTE — Therapy (Signed)
[place] [facility] [address] [place], [place], [address] Phone: [phone]   Fax:  [phone]  [facility]  Patient Details  Name: [name] HensenJohn Sterba MRN: [id number] Date of Birth: 12/31/1931 Referring Provider: Dr. Rodrigo RanMark Perini  Encounter Date: 09/21/2016      OT End of Session - 09/21/16 1312    Visit Number 3   Number of Visits 12   Date for OT Re-Evaluation 10/09/16   Authorization Type [employer] Medicare, no visit limit/auth, need G-code   Authorization Time Period cert. date 09/13/16-11/12/16   Authorization - Visit Number 3   Authorization - Number of Visits 10   OT Start Time 1017   OT Stop Time 1100   OT Time Calculation (min) 43 min   Activity Tolerance Patient tolerated treatment well   Behavior During Therapy WFL for tasks assessed/performed      Past Medical History:  Diagnosis Date  . Dyslipidemia   . Parkinson's disease (HCC)   . S/P mitral valve replacement    mitral regurg, endocarditis  . Stroke [facility](HCC)     Past Surgical History:  Procedure Laterality Date  . CATARACT EXTRACTION  2009, 2012   right 2009, left 2012  . LOOP RECORDER IMPLANT Left 03/13/2015   Procedure: LOOP RECORDER IMPLANT;  Surgeon: Marinus MawGregg W Taylor, MD;  [facility];  Service: Cardiovascular;  Laterality: Left;  . 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 TEE WITHOUT CARDIOVERSION N/A 03/13/2015   Procedure: TRANSESOPHAGEAL ECHOCARDIOGRAM (TEE);  Surgeon: Laurey Moralealton S McLean, MD;  [facility];  Service: Cardiovascular;  Laterality: N/A;  . TRANSTHORACIC ECHOCARDIOGRAM  08/15/2012   EF=>55%; normal LV systolic function; RV mildly dilated & systolic function mildly reduced; RA mod dilated; mild -mod MR & mildy increased gradients; mild TR; AV mildly sclerotic with mild-mod regurg    There were no vitals filed for this  visit.      Subjective Assessment - 09/21/16 1017    Subjective  Denies pain   Patient is accompained by: Family member   Patient Stated Goals improve coordination, balance                    Adapted strategy for fastening buttons with large amplitude movements, mod v..c, pt practiced with increased time required. Significant dyskinesias noted today during therapy.          OT Education - 09/21/16 1313    Education provided Yes   Education Details reviewed coordination HEP as pt reported it was challenging   Person(s) Educated Patient   Methods Explanation;Demonstration;Verbal cues   Comprehension Verbalized understanding;Returned demonstration;Verbal cues required             OT Long Term Goals - 09/21/16 1053      OT LONG TERM GOAL #1   Title Pt will verbalize understanding of strategies/AE to assist with ADLs/IADLs prn.--check LTGs 01/11/16   Time 4   Period Weeks   Status New     OT LONG TERM GOAL #2   Title Pt will improve coordination for ADLs as shown by improving time on 9-hole peg test by at least 4sec with RUE.   Baseline 37.72   Time 4   Period Weeks   Status New     OT LONG TERM GOAL #3   Title Pt will improve UE functional reaching/coordination as shown by improving score  on box and blocks test by at least 4 bilaterally.   Time 4   Period Weeks   Status New     OT LONG TERM GOAL #4   Title Pt will be perform updated HEP with min cues.   Time 4   Period Weeks   Status New     OT LONG TERM GOAL #5   Title Assess handwriting and set goal prn   Time 4   Period Weeks   Status Deferred  good size and legibility               Plan - 09/21/16 1057    Clinical Impression Statement Pt is progressing towards goals for coordination HEP and performance of ADLs with big movements.   Rehab Potential Good   OT Frequency 2x / week   OT Duration 4 weeks   OT Treatment/Interventions Self-care/ADL  training;Cryotherapy;Parrafin;Therapeutic exercise;DME and/or AE instruction;Building services engineerunctional Mobility Training;Therapeutic activities;Patient/family education;Balance training;Cognitive remediation/compensation;Splinting;Manual Therapy;Neuromuscular education;Fluidtherapy;Ultrasound;Moist Heat;Energy conservation;Passive range of motion;Therapeutic exercises;Visual/perceptual remediation/compensation   Plan continue strategies for ADLS, issue performing daily activities with big movments if appropriate      Patient will benefit from skilled therapeutic intervention in order to improve the following deficits and impairments:  Decreased balance, Abnormal gait, Decreased mobility, Difficulty walking, Impaired vision/preception, Impaired tone, Decreased cognition, Decreased activity tolerance, Decreased coordination, Decreased knowledge of use of DME, Impaired UE functional use, Improper spinal/pelvic alignment  Visit Diagnosis: Other lack of coordination  Other symptoms and signs involving the nervous system  Attention and concentration deficit  Other symptoms and signs involving the musculoskeletal system  Visuospatial deficit    Problem List Patient Active Problem List   Diagnosis Date Noted  . Encounter for therapeutic drug monitoring 01/05/2016  . Paroxysmal atrial fibrillation (HCC) 12/24/2015  . 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 HLD (hyperlipidemia) 06/02/2015  . PD (Parkinson's disease) (HCC) 06/02/2015  . Mitral regurgitation 03/17/2015  . CVA (cerebral infarction)   . TIA (transient ischemic attack) 03/05/2015  . Slurred speech 03/05/2015  . Anxiety 12/19/2013  . Parkinson's disease (HCC) 08/06/2013  . Dyslipidemia 08/06/2013  . S/P mitral valve repair 08/06/2013    RINE,KATHRYN 09/21/2016, 1:13 PM  [place] [facility] [address] [place], [place], [address] Phone:  [phone]   Fax:  [phone]  Name: [name] HensenJohn Alonge MRN: [id number] Date of Birth: 05/03/1932

## 2016-09-23 ENCOUNTER — Ambulatory Visit: Payer: Medicare Other | Admitting: Occupational Therapy

## 2016-09-23 ENCOUNTER — Ambulatory Visit: Payer: Medicare Other | Attending: Internal Medicine | Admitting: Physical Therapy

## 2016-09-23 DIAGNOSIS — R41842 Visuospatial deficit: Secondary | ICD-10-CM | POA: Insufficient documentation

## 2016-09-23 DIAGNOSIS — R29898 Other symptoms and signs involving the musculoskeletal system: Secondary | ICD-10-CM | POA: Insufficient documentation

## 2016-09-23 DIAGNOSIS — R29818 Other symptoms and signs involving the nervous system: Secondary | ICD-10-CM

## 2016-09-23 DIAGNOSIS — R2681 Unsteadiness on feet: Secondary | ICD-10-CM

## 2016-09-23 DIAGNOSIS — R278 Other lack of coordination: Secondary | ICD-10-CM | POA: Insufficient documentation

## 2016-09-23 DIAGNOSIS — R41840 Attention and concentration deficit: Secondary | ICD-10-CM | POA: Diagnosis present

## 2016-09-23 DIAGNOSIS — R2689 Other abnormalities of gait and mobility: Secondary | ICD-10-CM | POA: Insufficient documentation

## 2016-09-23 DIAGNOSIS — R293 Abnormal posture: Secondary | ICD-10-CM | POA: Diagnosis present

## 2016-09-23 NOTE — Patient Instructions (Signed)
PWR! Moves handout in standing:   -PWR! Up x 20 -PWR! Rock x 20 -PWR! Twist x 20 -PWR! Step x 20  To be performed once per day.

## 2016-09-23 NOTE — Patient Instructions (Addendum)
Performing Daily Activities with Big Movements  Pick at least 2 activities a day and perform with BIG, DELIBERATE movements/effort.  Try different activities each day. This can make the activity easier and turn daily activities into exercise to prevent problems in the future!  If you are standing during the activity, make sure to keep feet apart and stand with good/big/PWR! UP posture.  Examples:  Dressing - Push arms in sleeves, twist when putting on jacket, sit and push foot into pants, open hands to pull down shirt/put on socks/pull up pants  Buttoning - Open hands big (PWR! Hands) before fastening each button  Bathing - Wash/dry with long strokes  Brushing your teeth - Big, slow movements  Cutting food - Long deliberate cuts  Eating - Hold utensil in the middle, not the end  Picking up a cup/bottle - Open hand up big and get object all the way in palm  Opening jar/bottle - Move as much as you can with each turn  Putting on seatbelt - Twist when reaching, watch hand and reach big  Hanging up clothes/getting clothes down from closet - Reach with big effort, extend elbow, open hand  Putting away groceries/dishes - Reach with big effort,extend elbow, and open hand  Wiping counter/table - Move in big, long strokes, stand with feet apart  Stirring while cooking - Exaggerate movement, stand with feet apart  Cleaning windows - Move in big, long strokes, stand with feet apart  Sweeping - Move arms in big, long strokes, stand with feet apart  Vacuuming - Push with big movement, stand with feet apart  Folding clothes - Exaggerate arm movements  Washing car - Move in big, long strokes, stand with feet apart  Raking - Move arms in big, long strokes, stand with feet apart  Changing light bulb - Move as much as you can with each turn  Using a screwdriver - Move as much as you can with each turn  Walking into a store/restaurant - Walk with big steps, swing arms if  able  Standing up from a chair/recliner/sofa - Scoot forward, lean forward, and stand with big effort

## 2016-09-23 NOTE — Therapy (Signed)
[facility] [address] [place], [place], [address] Phone: [phone]   Fax:  [phone]  Occupational Therapy Treatment  Patient Details  Name: [name] [name] MRN: [id number] Date of Birth: 08/05/1932 Referring Provider: Dr. Rodrigo RanMark Perini  Encounter Date: 09/23/2016      OT End of Session - 09/23/16 1022    Visit Number 4   Number of Visits 12   Date for OT Re-Evaluation 10/09/16   Authorization Type [employer] Medicare, no visit limit/auth, need G-code   Authorization Time Period cert. date 09/13/16-11/12/16   Authorization - Visit Number 4   Authorization - Number of Visits 10   OT Start Time 1020   OT Stop Time 1100   OT Time Calculation (min) 40 min   Activity Tolerance Patient tolerated treatment well   Behavior During Therapy WFL for tasks assessed/performed      Past Medical History:  Diagnosis Date  . Dyslipidemia   . Parkinson's disease (HCC)   . S/P mitral valve replacement    mitral regurg, endocarditis  . Stroke [facility](HCC)     Past Surgical History:  Procedure Laterality Date  . CATARACT EXTRACTION  2009, 2012   right 2009, left 2012  . LOOP RECORDER IMPLANT Left 03/13/2015   Procedure: LOOP RECORDER IMPLANT;  Surgeon: Marinus MawGregg W Taylor, MD;  [facility];  Service: Cardiovascular;  Laterality: Left;  . 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 TEE WITHOUT CARDIOVERSION N/A 03/13/2015   Procedure: TRANSESOPHAGEAL ECHOCARDIOGRAM (TEE);  Surgeon: Laurey Moralealton S McLean, MD;  [facility];  Service: Cardiovascular;  Laterality: N/A;  . TRANSTHORACIC ECHOCARDIOGRAM  08/15/2012   EF=>55%; normal LV systolic function; RV mildly dilated & systolic function mildly reduced; RA mod dilated; mild -mod MR & mildy increased gradients; mild TR; AV mildly sclerotic with mild-mod regurg    There were no vitals filed for this  visit.      Subjective Assessment - 09/23/16 1021    Subjective  Pt reports fall getting the mail last week (will use basket in the future) with R hand stiffness.   Patient is accompained by: Family member   Patient Stated Goals improve coordination, balance   Currently in Pain? No/denies        Self Care:     Instruction in importance and  in use of large amplitude movements to prevent future complications related to PD and incr ease of ADLs.  Pt verbalized understanding.   Practiced wiping towel with feet apart for incr balance and improved shoulder ROM.  Eating:  Instructed pt in strategies for eating including holding utensil in the middle vs. The end and practiced scooping dried beans for simulated eating.  Pt verbalized understanding.  Practiced grasp/release of cylinder objects (bottles, cups) with use of PWR! Hands/large amplitude movements with min cueing.  Opening/closing bottles with use of large amplitude movement strategies with min cueing.   Dressing:    Practiced donning/doffing jacket using large amplitude movement strategies after initial instruction.    Simulated ADLs with bag with focus/min cues for large amplitude movements:  Donning/doffing pull-over shirt, donning/doffing pants, pulling bag into hand for clothing adjustment/donning socks, pulling shirt down in back, drying back.                     OT Education - 09/23/16 1030    Education Details Big movements  for ADLs   Person(s) Educated Patient   Methods Demonstration;Explanation;Handout   Comprehension Verbalized understanding;Returned demonstration;Verbal cues required             OT Long Term Goals - 09/21/16 1053      OT LONG TERM GOAL #1   Title Pt will verbalize understanding of strategies/AE to assist with ADLs/IADLs prn.--check LTGs 01/11/16   Time 4   Period Weeks   Status New     OT LONG TERM GOAL #2   Title Pt will improve coordination for ADLs as shown by  improving time on 9-hole peg test by at least 4sec with RUE.   Baseline 37.72   Time 4   Period Weeks   Status New     OT LONG TERM GOAL #3   Title Pt will improve UE functional reaching/coordination as shown by improving score on box and blocks test by at least 4 bilaterally.   Time 4   Period Weeks   Status New     OT LONG TERM GOAL #4   Title Pt will be perform updated HEP with min cues.   Time 4   Period Weeks   Status New     OT LONG TERM GOAL #5   Title Assess handwriting and set goal prn   Time 4   Period Weeks   Status Deferred  good size and legibility               Plan - 09/23/16 1027    Clinical Impression Statement Pt is progessing towards goals.  Pt demo incr difficulty with R hand today due to fall last wk per pt report.   Rehab Potential Good   OT Frequency 2x / week   OT Duration 4 weeks   OT Treatment/Interventions Self-care/ADL training;Cryotherapy;Parrafin;Therapeutic exercise;DME and/or AE instruction;Building services engineerunctional Mobility Training;Therapeutic activities;Patient/family education;Balance training;Cognitive remediation/compensation;Splinting;Manual Therapy;Neuromuscular education;Fluidtherapy;Ultrasound;Moist Heat;Energy conservation;Passive range of motion;Therapeutic exercises;Visual/perceptual remediation/compensation      Patient will benefit from skilled therapeutic intervention in order to improve the following deficits and impairments:  Decreased balance, Abnormal gait, Decreased mobility, Difficulty walking, Impaired vision/preception, Impaired tone, Decreased cognition, Decreased activity tolerance, Decreased coordination, Decreased knowledge of use of DME, Impaired UE functional use, Improper spinal/pelvic alignment  Visit Diagnosis: Other symptoms and signs involving the nervous system  Attention and concentration deficit  Other symptoms and signs involving the musculoskeletal system  Other lack of coordination  Visuospatial  deficit  Abnormal posture  Unsteadiness on feet  Other abnormalities of gait and mobility    Problem List Patient Active Problem List   Diagnosis Date Noted  . Encounter for therapeutic drug monitoring 01/05/2016  . Paroxysmal atrial fibrillation (HCC) 12/24/2015  . 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 HLD (hyperlipidemia) 06/02/2015  . PD (Parkinson's disease) (HCC) 06/02/2015  . Mitral regurgitation 03/17/2015  . CVA (cerebral infarction)   . TIA (transient ischemic attack) 03/05/2015  . Slurred speech 03/05/2015  . Anxiety 12/19/2013  . Parkinson's disease (HCC) 08/06/2013  . Dyslipidemia 08/06/2013  . S/P mitral valve repair 08/06/2013    [facility] Recovery CenterFREEMAN,Trysta Showman 09/23/2016, 10:32 AM  [place] [facility] [address] [place], [place], [address] Phone: [phone]   Fax:  [phone]  Name: [name] [name] MRN: [id number] Date of Birth: 12/26/1931   Willa FraterAngela Dominiq Fontaine, OTR/L [facility] [address] [place], [place]  [address] [phone] phone [phone] 09/23/16 12:33 PM

## 2016-09-23 NOTE — Therapy (Signed)
[name] [address] [place], [place], [address] Phone: [phone]   Fax:  [phone]  Physical Therapy Treatment  Patient Details  Name: [name] HensenJohn [name] MRN: [id number] Date of Birth: 11/07/1932 Referring Provider: Waynard EdwardsPerini  Encounter Date: 09/23/2016      PT End of Session - 09/23/16 1334    Visit Number 4   Number of Visits 17   Date for PT Re-Evaluation 11/12/16  per recert 09/13/16   Authorization Type UHC Medicare-GCODE every 10th visit   PT Start Time 0935   PT Stop Time 1015   PT Time Calculation (min) 40 min   Activity Tolerance Patient tolerated treatment well  Several seated rest breaks during session   Behavior During Therapy [facility] for tasks assessed/performed      Past Medical History:  Diagnosis Date  . Dyslipidemia   . Parkinson's disease (HCC)   . S/P mitral valve replacement    mitral regurg, endocarditis  . Stroke [facility](HCC)     Past Surgical History:  Procedure Laterality Date  . CATARACT EXTRACTION  2009, 2012   right 2009, left 2012  . LOOP RECORDER IMPLANT Left 03/13/2015   Procedure: LOOP RECORDER IMPLANT;  Surgeon: Marinus MawGregg W Taylor, MD;  [facility];  Service: Cardiovascular;  Laterality: Left;  . 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 TEE WITHOUT CARDIOVERSION N/A 03/13/2015   Procedure: TRANSESOPHAGEAL ECHOCARDIOGRAM (TEE);  Surgeon: Laurey Moralealton S McLean, MD;  [facility];  Service: Cardiovascular;  Laterality: N/A;  . TRANSTHORACIC ECHOCARDIOGRAM  08/15/2012   EF=>55%; normal LV systolic function; RV mildly dilated & systolic function mildly reduced; RA mod dilated; mild -mod MR & mildy increased gradients; mild TR; AV mildly sclerotic with mild-mod regurg    There were no vitals filed for this visit.      Subjective Assessment - 09/23/16 0938    Subjective No changes since the  other day.   Patient Stated Goals Pt's goals are to help with the stutter steps.   Currently in Pain? No/denies                         [facility]/Exercise - 09/23/16 0940      Transfers   Transfers Sit to Stand;Stand to Sit   Sit to Stand 6: Modified independent (Device/Increase time)   Stand to Sit 6: Modified independent (Device/Increase time)   Number of Reps 10 reps;Other sets (comment)  each, from 22", 18", 16"   Comments Round robin sit<>stand transfers clockwise and counterclockwise to varied chairs, using side-stepping technique to get to next chair.       Exercises   Exercises Other Exercises   Other Exercises  Postural exercises:  standing at doorframe with best posture:  neck retraction x 10 reps, then scapular retraction x 10 reps; wall squats x 10 reps.  Cues for slowed pacing for optimal muscle activation.           PWR [facility] - 09/23/16 0946    PWR! exercises Moves in standing   PWR! Up x 20  cues for quad activation for full knee extension in standing   PWR! Rock x 10 reps each side   PWR! Twist x 10 reps each side  cues to hold position open in middle   PWR Step x 10 reps each side, then forward, then  back, alternating steps   Comments In standing:  Cues for technique and pacing of movement patterns.  Discussed functional activities associated with each PWR! Move (PWR! Up for posture, PWR! Rock for weightshifting and reaching, PWR! Twist for improved trunk rotation and flexibility, PWR! Step for improved initial step), including increased awareness of movement patterns incorporating PWR! MOves throughout the day.          Balance Exercises - 09/23/16 1005      Balance Exercises: Standing   Retro Gait 5 reps  Cues for upright posture   Sidestepping 5 reps  Cues for posture   Other Standing Exercises Stagger stance foot position with forward/back weightshifting x 10 reps each side    Forward step and weightshift x10 reps,  then back step and weightshift x 10 reps at counter       PT Education - 09/23/16 1333    Education provided Yes   Education Details HEP-PWR! MOves in standing   Person(s) Educated Patient   Methods Explanation;Demonstration;Handout   Comprehension Verbalized understanding;Returned demonstration;Verbal cues required          PT Short Term Goals - 09/14/16 1427      PT SHORT TERM GOAL #1   Title Pt will perform HEP for improved posture, balance, and gait with wife's supervision.  Updated TARGET 10/13/16   Time 4   Period Weeks   Status New     PT SHORT TERM GOAL #2   Title Pt will perform at least 8 of 10 reps of sit<>stand transfers with proper, safe technique for improved efficiency and safety with transfers.   Time 4   Period Weeks   Status New     PT SHORT TERM GOAL #3   Title Pt will verbalize/demonstrate understanding of tips to reduce festinating and freezing with gait.   Time 4   Period Weeks   Status New     PT SHORT TERM GOAL #4   Title Pt will improve TUG and TUG cognitive to within 10% to demonstrate improved ability for dual tasking.   Time 4   Period Weeks   Status New           PT Long Term Goals - 09/14/16 1427      PT LONG TERM GOAL #1   Title Pt/wife will verbalize understanding of fall prevention within home environment.  UPdated TARGET 11/12/16   Time 8   Period Weeks   Status New     PT LONG TERM GOAL #2   Title Pt will improve MiniBESTest to at least 20/28 for improved balance/decreased fall risk.   Time 8   Period Weeks   Status New     PT LONG TERM GOAL #3   Title Pt will demonstrate improved ability for dual tasking by ambulating at least 500 ft on indoor/outdoor surfaces, while carrying items, modified independently.   Time 8   Period Weeks   Status New     PT LONG TERM GOAL #4   Title Pt will negotiate curb step modified independently, no LOB, 5 of 5 trials, for improved curb negotiation.   Time 8   Period Weeks   Status  New     PT LONG TERM GOAL #5   Title Pt will verbalize plans for continued community fitness upon D/C from PT.   Time 8   Period Weeks   Status New               Plan - 09/23/16  1334    Clinical Impression Statement Pt continues to need cues for slowed pacing of exercises for posture and balance.  Initiated HEP for Lowe[employer] CompaniesPWR! Moves to address posture, weightshifting, trunk flexibility and stepping, for improved awareness of large, deliberate movement patterns and incorporating them into functional activities.  Pt will continue to benefit from further skilled PT to address balance, posture and gait.   Rehab Potential Good   PT Frequency 2x / week   PT Duration 8 weeks  per recert 09/13/16   PT Treatment/Interventions ADLs/Self Care Home Management;Functional mobility training;Gait training;DME Instruction;Therapeutic activities;Therapeutic exercise;Balance training;Neuromuscular re-education;Patient/family education   PT Next Visit Plan  Review PWR! Moves in standing for weigthshifting and stepping exercises, gait activities-dual tasking   Consulted and Agree with Plan of Care Patient      Patient will benefit from skilled therapeutic intervention in order to improve the following deficits and impairments:  Abnormal gait, Decreased balance, Decreased mobility, Decreased safety awareness, Decreased strength, Difficulty walking, Postural dysfunction  Visit Diagnosis: Unsteadiness on feet  Abnormal posture  Other symptoms and signs involving the nervous system     Problem List Patient Active Problem List   Diagnosis Date Noted  . Encounter for therapeutic drug monitoring 01/05/2016  . Paroxysmal atrial fibrillation (HCC) 12/24/2015  . 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 HLD (hyperlipidemia) 06/02/2015  . PD (Parkinson's disease) (HCC) 06/02/2015  . Mitral regurgitation 03/17/2015  . CVA (cerebral infarction)   . TIA  (transient ischemic attack) 03/05/2015  . Slurred speech 03/05/2015  . Anxiety 12/19/2013  . Parkinson's disease (HCC) 08/06/2013  . Dyslipidemia 08/06/2013  . S/P mitral valve repair 08/06/2013    Inika Bellanger W. 09/23/2016, 1:39 PM  Gean MaidensMARRIOTT,Billy Turvey W., PT [facility] [address] [place], [place], [address] Phone: [phone]   Fax:  [phone]  Name: [name] HensenJohn [name] MRN: [id number] Date of Birth: 03/31/1932

## 2016-09-28 ENCOUNTER — Ambulatory Visit: Payer: Medicare Other | Admitting: Physical Therapy

## 2016-09-28 ENCOUNTER — Encounter: Payer: Medicare Other | Admitting: Occupational Therapy

## 2016-09-30 ENCOUNTER — Ambulatory Visit: Payer: Medicare Other | Admitting: Occupational Therapy

## 2016-09-30 ENCOUNTER — Encounter: Payer: Self-pay | Admitting: Physical Therapy

## 2016-09-30 ENCOUNTER — Ambulatory Visit: Payer: Medicare Other | Admitting: Physical Therapy

## 2016-09-30 DIAGNOSIS — R2689 Other abnormalities of gait and mobility: Secondary | ICD-10-CM

## 2016-09-30 DIAGNOSIS — R41840 Attention and concentration deficit: Secondary | ICD-10-CM

## 2016-09-30 DIAGNOSIS — R293 Abnormal posture: Secondary | ICD-10-CM

## 2016-09-30 DIAGNOSIS — R41842 Visuospatial deficit: Secondary | ICD-10-CM

## 2016-09-30 DIAGNOSIS — R278 Other lack of coordination: Secondary | ICD-10-CM

## 2016-09-30 DIAGNOSIS — R29818 Other symptoms and signs involving the nervous system: Secondary | ICD-10-CM

## 2016-09-30 DIAGNOSIS — R29898 Other symptoms and signs involving the musculoskeletal system: Secondary | ICD-10-CM

## 2016-09-30 DIAGNOSIS — R2681 Unsteadiness on feet: Secondary | ICD-10-CM

## 2016-09-30 NOTE — Therapy (Signed)
[facility] [address] [place], [place], [address] Phone: [phone]   Fax:  [phone]  Occupational Therapy Treatment  Patient Details  Name: [name] [name] MRN: [id number] Date of Birth: [birth year] Referring Provider: Dr. Rodrigo Ran  Encounter Date: 09/30/2016      OT End of Session - 09/30/16 0954    Visit Number 5   Number of Visits 12   Date for OT Re-Evaluation 10/09/16   Authorization Type UHC Medicare, no visit limit/auth, need G-code   Authorization Time Period cert. date 09/13/16-11/12/16   Authorization - Visit Number 5   Authorization - Number of Visits 10   OT Start Time [phone]   OT Stop Time 1015   OT Time Calculation (min) 41 min   Activity Tolerance Patient tolerated treatment well   Behavior During Therapy [facility] for tasks assessed/performed      Past Medical History:  Diagnosis Date  . Dyslipidemia   . Parkinson's disease (HCC)   . S/P mitral valve replacement    mitral regurg, endocarditis  . Stroke [facility])     Past Surgical History:  Procedure Laterality Date  . CATARACT EXTRACTION  2009, 2012   right 2009, left 2012  . LOOP RECORDER IMPLANT Left 03/13/2015   Procedure: LOOP RECORDER IMPLANT;  Surgeon: Marinus Maw, MD;  [facility];  Service: Cardiovascular;  Laterality: Left;  . 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 TEE WITHOUT CARDIOVERSION N/A 03/13/2015   Procedure: TRANSESOPHAGEAL ECHOCARDIOGRAM (TEE);  Surgeon: Laurey Morale, MD;  [facility]COPY;  Service: Cardiovascular;  Laterality: N/A;  . TRANSTHORACIC ECHOCARDIOGRAM  08/15/2012   EF=>55%; normal LV systolic function; RV mildly dilated & systolic function mildly reduced; RA mod dilated; mild -mod MR & mildy increased gradients; mild TR; AV mildly sclerotic with mild-mod regurg    There were no vitals filed for this  visit.      Subjective Assessment - 09/30/16 0951    Subjective  Pt reports some discomfort/pain with PWR! twist with arms at 90* when doing it in class   Patient is accompained by: Family member   Patient Stated Goals improve coordination, balance   Currently in Pain? No/denies        Neuro re-ed:  PWR! Moves in sitting (basic 4) and supine (up, rock, twist)  x 10-20 each with min cues For incr movement amplitude and correct performance.  In sitting, functional reaching in ER/overhead and to the floor with trunk rotation/wt. Shift To place large pegs in vertical pegboard with set-up for large amplitude movements and min cues for PWR! Hands and for head turns to decr risk of shoulder injury.  Performing with sequencing for dual task with min cues for sequencing initially.  Placing small pegs in pegboard with each hand to copy design for cognitive component/visual perceptual skills.  Pt with min cueing for use of PWR! Hands prior to grasp of peg and difficulty for copying and needed incr time, particularly on L side but only 1 v.c. Needed for accuracy.   Removed using PWR! Hands with min cueing.  PWR! Hands x10 with min cues to slow down and for incr movement amplitude.                       OT Short Term Goals - 08/10/16 2206      OT  SHORT TERM GOAL #1   Title -------------------   Time --     OT SHORT TERM GOAL #2   Title -----------------     OT SHORT TERM GOAL #3   Title -----------------     OT SHORT TERM GOAL #4   Title ------------------           OT Long Term Goals - 09/21/16 1053      OT LONG TERM GOAL #1   Title Pt will verbalize understanding of strategies/AE to assist with ADLs/IADLs prn.--check LTGs 01/11/16   Time 4   Period Weeks   Status New     OT LONG TERM GOAL #2   Title Pt will improve coordination for ADLs as shown by improving time on 9-hole peg test by at least 4sec with RUE.   Baseline 37.72   Time 4   Period Weeks    Status New     OT LONG TERM GOAL #3   Title Pt will improve UE functional reaching/coordination as shown by improving score on box and blocks test by at least 4 bilaterally.   Time 4   Period Weeks   Status New     OT LONG TERM GOAL #4   Title Pt will be perform updated HEP with min cues.   Time 4   Period Weeks   Status New     OT LONG TERM GOAL #5   Title Assess handwriting and set goal prn   Time 4   Period Weeks   Status Deferred  good size and legibility               Plan - 09/30/16 0954    Clinical Impression Statement Pt is progressing toward goals with improving movement amplitude.   Rehab Potential Good   OT Frequency 2x / week   OT Duration 4 weeks   OT Treatment/Interventions Self-care/ADL training;Cryotherapy;Parrafin;Therapeutic exercise;DME and/or AE instruction;Building services engineerunctional Mobility Training;Therapeutic activities;Patient/family education;Balance training;Cognitive remediation/compensation;Splinting;Manual Therapy;Neuromuscular education;Fluidtherapy;Ultrasound;Moist Heat;Energy conservation;Passive range of motion;Therapeutic exercises;Visual/perceptual remediation/compensation   Plan check goals   OT Home Exercise Plan Education provided:  coordination HEP   Consulted and Agree with Plan of Care Patient      Patient will benefit from skilled therapeutic intervention in order to improve the following deficits and impairments:  Decreased balance, Abnormal gait, Decreased mobility, Difficulty walking, Impaired vision/preception, Impaired tone, Decreased cognition, Decreased activity tolerance, Decreased coordination, Decreased knowledge of use of DME, Impaired UE functional use, Improper spinal/pelvic alignment  Visit Diagnosis: Other symptoms and signs involving the nervous system  Other symptoms and signs involving the musculoskeletal system  Attention and concentration deficit  Other lack of coordination  Other abnormalities of gait and  mobility  Abnormal posture  Unsteadiness on feet  Visuospatial deficit    Problem List Patient Active Problem List   Diagnosis Date Noted  . Encounter for therapeutic drug monitoring 01/05/2016  . Paroxysmal atrial fibrillation (HCC) 12/24/2015  . 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 HLD (hyperlipidemia) 06/02/2015  . PD (Parkinson's disease) (HCC) 06/02/2015  . Mitral regurgitation 03/17/2015  . CVA (cerebral infarction)   . TIA (transient ischemic attack) 03/05/2015  . Slurred speech 03/05/2015  . Anxiety 12/19/2013  . Parkinson's disease (HCC) 08/06/2013  . Dyslipidemia 08/06/2013  . S/P mitral valve repair 08/06/2013    [facility]REEM[facility],ANGELA 09/30/2016, 9:58 AM  [facility] [address] [place], [place], [address] Phone: [phone]   Fax:  [phone]  Name: Cherly HensenJohn Caridi MRN: [id number] Date of Birth: 08/10/1932   Willa FraterAngela Freeman, OTR/L [facility] [address] [place], [place]  [address] [phone] phone [phone] 09/30/16 10:02 AM

## 2016-09-30 NOTE — Therapy (Signed)
[facility] [address] [place], [place], [address] Phone: [phone]   Fax:  [phone]  Physical Therapy Treatment  Patient Details  Name: [name] [name] MRN: [id number] Date of Birth: 03/08/1932 Referring Provider: Waynard EdwardsPerini  Encounter Date: 09/30/2016      PT End of Session - 09/30/16 0929    Visit Number 5   Number of Visits 17   Date for PT Re-Evaluation 11/12/16  per recert 09/13/16   Authorization Type UHC Medicare-GCODE every 10th visit   PT Start Time 0850   PT Stop Time 0928   PT Time Calculation (min) 38 min   Activity Tolerance Patient tolerated treatment well  Several seated rest breaks during session   Behavior During Therapy [facility] for tasks assessed/performed      Past Medical History:  Diagnosis Date  . Dyslipidemia   . Parkinson's disease (HCC)   . S/P mitral valve replacement    mitral regurg, endocarditis  . Stroke [facility](HCC)     Past Surgical History:  Procedure Laterality Date  . CATARACT EXTRACTION  2009, 2012   right 2009, left 2012  . LOOP RECORDER IMPLANT Left 03/13/2015   Procedure: LOOP RECORDER IMPLANT;  Surgeon: Marinus MawGregg W Taylor, MD;  [facility];  Service: Cardiovascular;  Laterality: Left;  . 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 TEE WITHOUT CARDIOVERSION N/A 03/13/2015   Procedure: TRANSESOPHAGEAL ECHOCARDIOGRAM (TEE);  Surgeon: Laurey Moralealton S McLean, MD;  [facility];  Service: Cardiovascular;  Laterality: N/A;  . TRANSTHORACIC ECHOCARDIOGRAM  08/15/2012   EF=>55%; normal LV systolic function; RV mildly dilated & systolic function mildly reduced; RA mod dilated; mild -mod MR & mildy increased gradients; mild TR; AV mildly sclerotic with mild-mod regurg    There were no vitals filed for this visit.      Subjective Assessment - 09/30/16 0850    Subjective Pt has been experimenting  with the frequency of taking PD meds with MD help.    Currently in Pain? No/denies                         [facility] Treatment/Exercise - 09/30/16 0001      Ambulation/Gait   Ambulation/Gait Yes   Ambulation/Gait Assistance 6: Modified independent (Device/Increase time)   Ambulation/Gait Assistance Details Practising greater push off with toe during terminal stance and maintaining upright posture.   Ambulation Distance (Feet) 230 Feet   Assistive device None   Gait Pattern Step-through pattern;Decreased arm swing - right;Decreased arm swing - left;Trunk flexed;Narrow base of support   Ambulation Surface Level;Indoor           PWR [facility] - 09/30/16 0853 STANDING   PWR! Up x20   PWR! Rock Well[employer]x20   PWR! Twist x20   PWR Step x20   Comments Min cues for posture and sequence          Balance Exercises - 09/30/16 0932      Balance Exercises: Standing   Stepping Strategy Posterior;Anterior  cues for body mechanics for weight shifting; progressing with 1 UE to no UE    Step Ups 6 inch  alt tapping progressing with upright gaze and head turns; Min guard.   Other Standing Exercises Stagger stance foot position with forward/back weightshifting x 10 reps each side  PT Education - 09/30/16 1044    Education provided Yes   Education Details Estate manager/land agent for weight shifting   Person(s) Educated Patient   Methods Explanation;Demonstration;Tactile cues;Verbal cues   Comprehension Verbalized understanding;Returned demonstration;Need further instruction;Verbal cues required          PT Short Term Goals - 09/14/16 1427      PT SHORT TERM GOAL #1   Title Pt will perform HEP for improved posture, balance, and gait with wife's supervision.  Updated TARGET 10/13/16   Time 4   Period Weeks   Status New     PT SHORT TERM GOAL #2   Title Pt will perform at least 8 of 10 reps of sit<>stand transfers with proper, safe technique for improved efficiency  and safety with transfers.   Time 4   Period Weeks   Status New     PT SHORT TERM GOAL #3   Title Pt will verbalize/demonstrate understanding of tips to reduce festinating and freezing with gait.   Time 4   Period Weeks   Status New     PT SHORT TERM GOAL #4   Title Pt will improve TUG and TUG cognitive to within 10% to demonstrate improved ability for dual tasking.   Time 4   Period Weeks   Status New           PT Long Term Goals - 09/14/16 1427      PT LONG TERM GOAL #1   Title Pt/wife will verbalize understanding of fall prevention within home environment.  UPdated TARGET 11/12/16   Time 8   Period Weeks   Status New     PT LONG TERM GOAL #2   Title Pt will improve MiniBESTest to at least 20/28 for improved balance/decreased fall risk.   Time 8   Period Weeks   Status New     PT LONG TERM GOAL #3   Title Pt will demonstrate improved ability for dual tasking by ambulating at least 500 ft on indoor/outdoor surfaces, while carrying items, modified independently.   Time 8   Period Weeks   Status New     PT LONG TERM GOAL #4   Title Pt will negotiate curb step modified independently, no LOB, 5 of 5 trials, for improved curb negotiation.   Time 8   Period Weeks   Status New     PT LONG TERM GOAL #5   Title Pt will verbalize plans for continued community fitness upon D/C from PT.   Time 8   Period Weeks   Status New               Plan - 09/30/16 1045    Clinical Impression Statement Pt requires cue for body mechanics for step and weight shifting and intermittent UE support for balance.  Pt is making progress in understanding PWR! Moves in standing and performs at supervision level with no UE support.   Rehab Potential Good   PT Frequency 2x / week   PT Duration 8 weeks  per recert 09/13/16   PT Treatment/Interventions ADLs/Self Care Home Management;Functional mobility training;Gait training;DME Instruction;Therapeutic activities;Therapeutic  exercise;Balance training;Neuromuscular re-education;Patient/family education   PT Next Visit Plan  Review PWR! Moves in standing for weigthshifting and stepping exercises, gait activities-dual tasking   Consulted and Agree with Plan of Care Patient      Patient will benefit from skilled therapeutic intervention in order to improve the following deficits and impairments:  Abnormal gait, Decreased balance, Decreased mobility,  Decreased safety awareness, Decreased strength, Difficulty walking, Postural dysfunction  Visit Diagnosis: Unsteadiness on feet  Abnormal posture  Other abnormalities of gait and mobility     Problem List Patient Active Problem List   Diagnosis Date Noted  . Encounter for therapeutic drug monitoring 01/05/2016  . Paroxysmal atrial fibrillation (HCC) 12/24/2015  . 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 HLD (hyperlipidemia) 06/02/2015  . PD (Parkinson's disease) (HCC) 06/02/2015  . Mitral regurgitation 03/17/2015  . CVA (cerebral infarction)   . TIA (transient ischemic attack) 03/05/2015  . Slurred speech 03/05/2015  . Anxiety 12/19/2013  . Parkinson's disease (HCC) 08/06/2013  . Dyslipidemia 08/06/2013  . S/P mitral valve repair 08/06/2013    Hortencia ConradiKarissa Xyla Leisner, PTA  09/30/16, 10:50 AM [facility] [address] [place], [place], [address] Phone: [phone]   Fax:  [phone]  Name: [name] [name] MRN: [id number] Date of Birth: 05/15/1932

## 2016-10-04 ENCOUNTER — Ambulatory Visit (INDEPENDENT_AMBULATORY_CARE_PROVIDER_SITE_OTHER): Payer: Medicare Other | Admitting: *Deleted

## 2016-10-04 DIAGNOSIS — I63412 Cerebral infarction due to embolism of left middle cerebral artery: Secondary | ICD-10-CM

## 2016-10-04 LAB — CUP PACEART REMOTE DEVICE CHECK
Implantable Pulse Generator Implant Date: 20160421
MDC IDC SESS DTM: 20171013183746

## 2016-10-04 NOTE — Progress Notes (Signed)
Carelink summary report received. Battery status OK. Normal device function. No new symptom episodes, tachy episodes, brady, or pause episodes. No new AF episodes. Monthly summary reports and ROV/PRN 

## 2016-10-05 ENCOUNTER — Ambulatory Visit (INDEPENDENT_AMBULATORY_CARE_PROVIDER_SITE_OTHER): Payer: Medicare Other | Admitting: *Deleted

## 2016-10-05 ENCOUNTER — Ambulatory Visit: Payer: Medicare Other | Admitting: Occupational Therapy

## 2016-10-05 ENCOUNTER — Ambulatory Visit: Payer: Medicare Other | Admitting: Physical Therapy

## 2016-10-05 DIAGNOSIS — R2681 Unsteadiness on feet: Secondary | ICD-10-CM

## 2016-10-05 DIAGNOSIS — R278 Other lack of coordination: Secondary | ICD-10-CM

## 2016-10-05 DIAGNOSIS — R29898 Other symptoms and signs involving the musculoskeletal system: Secondary | ICD-10-CM

## 2016-10-05 DIAGNOSIS — I63412 Cerebral infarction due to embolism of left middle cerebral artery: Secondary | ICD-10-CM | POA: Diagnosis not present

## 2016-10-05 DIAGNOSIS — I480 Paroxysmal atrial fibrillation: Secondary | ICD-10-CM | POA: Diagnosis not present

## 2016-10-05 DIAGNOSIS — Z9889 Other specified postprocedural states: Secondary | ICD-10-CM

## 2016-10-05 DIAGNOSIS — R2689 Other abnormalities of gait and mobility: Secondary | ICD-10-CM

## 2016-10-05 DIAGNOSIS — R29818 Other symptoms and signs involving the nervous system: Secondary | ICD-10-CM

## 2016-10-05 DIAGNOSIS — Z5181 Encounter for therapeutic drug level monitoring: Secondary | ICD-10-CM

## 2016-10-05 DIAGNOSIS — G458 Other transient cerebral ischemic attacks and related syndromes: Secondary | ICD-10-CM

## 2016-10-05 DIAGNOSIS — R41842 Visuospatial deficit: Secondary | ICD-10-CM

## 2016-10-05 DIAGNOSIS — R41840 Attention and concentration deficit: Secondary | ICD-10-CM

## 2016-10-05 DIAGNOSIS — R293 Abnormal posture: Secondary | ICD-10-CM

## 2016-10-05 LAB — POCT INR: INR: 2

## 2016-10-05 NOTE — Progress Notes (Signed)
Carelink Summary Report / Loop Recorder 

## 2016-10-05 NOTE — Therapy (Signed)
[facility] [address] [place], [place], [address] Phone: [phone]   Fax:  [phone]  Occupational Therapy Treatment  Patient Details  Name: [name] [name] MRN: [id number] Date of Birth: 1932/03/31 Referring Provider: Dr. Crist Infante  Encounter Date: 10/05/2016      OT End of Session - 10/05/16 1024    Visit Number 6   Number of Visits 12   Date for OT Re-Evaluation 10/09/16   Authorization Type U[employer] Medicare, no visit limit/auth, need G-code   Authorization Time Period cert. date 09/13/16-11/12/16   Authorization - Visit Number 6   Authorization - Number of Visits 10   OT Start Time 1020   OT Stop Time 1100   OT Time Calculation (min) 40 min   Activity Tolerance Patient tolerated treatment well   Behavior During Therapy WFL for tasks assessed/performed      Past Medical History:  Diagnosis Date  . Dyslipidemia   . Parkinson's disease ([place])   . S/P mitral valve replacement    mitral regurg, endocarditis  . Stroke [facility])     Past Surgical History:  Procedure Laterality Date  . CATARACT EXTRACTION  2009, 2012   right 2009, left 2012  . LOOP RECORDER IMPLANT Left 03/13/2015   Procedure: LOOP RECORDER IMPLANT;  Surgeon: Evans Lance, MD;  Location: [facility] CATH LAB;  Service: Cardiovascular;  Laterality: Left;  . MITRAL VALVE ANNULOPLASTY  04/10/2007    66m Edwards ring; r/t h/o MR and flail mitral leaflets due to endocarditis: MRI safe  . TEE WITHOUT CARDIOVERSION N/A 03/13/2015   Procedure: TRANSESOPHAGEAL ECHOCARDIOGRAM (TEE);  Surgeon: DLarey Dresser MD;  Location: [facility]  Service: Cardiovascular;  Laterality: N/A;  . TRANSTHORACIC ECHOCARDIOGRAM  08/15/2012   EF=>55%; normal LV systolic function; RV mildly dilated & systolic function mildly reduced; RA mod dilated; mild -mod MR & mildy increased gradients; mild TR; AV mildly sclerotic with mild-mod regurg    There were no vitals filed for this  visit.      Subjective Assessment - 10/05/16 1022    Subjective  "I feel good about things"   Patient is accompained by: Family member   Patient Stated Goals improve coordination, balance   Currently in Pain? No/denies        In standing, functional reaching in ER/overhead with trunk rotation/wt. Shift To place large pegs in vertical pegboard with set-up for large amplitude movements and min cues for PWR! Hands and keeping feet apart.    In standing, functional step and reach laterally to grasp/toss scarves objects using PWR! Hands/reach with min cueing for large amplitude                      OT Treatments/Exercises (OP) - 10/05/16 1059      ADLs   ADL Comments began checking goals and discussing progress--see goal section below     Fine Motor Coordination   Fine Motor Coordination Grooved pegs   Grooved pegs with each hand with min difficulty for incr coordination               OT Short Term Goals - 08/10/16 2206      OT SHORT TERM GOAL #1   Title -------------------   Time --     OT SHORT TERM GOAL #2   Title -----------------     OT SHORT TERM GOAL #3   Title -----------------     OT SHORT TERM GOAL #4   Title ------------------  OT Long Term Goals - 10/05/16 1041      OT LONG TERM GOAL #1   Title Pt will verbalize understanding of strategies/AE to assist with ADLs/IADLs prn.--check LTGs 01/11/16   Time 4   Period Weeks   Status New     OT LONG TERM GOAL #2   Title Pt will improve coordination for ADLs as shown by improving time on 9-hole peg test by at least 4sec with RUE.   Baseline 37.72   Time 4   Period Weeks   Status Not Met  10/05/16:  47.34, 38.93sec     OT LONG TERM GOAL #3   Title Pt will improve UE functional reaching/coordination as shown by improving score on box and blocks test by at least 4 bilaterally.   Baseline R-42, L-41 blocks   Time 4   Period Weeks   Status Not Met  10/05/16:  R-41blocks,  L-39blocks     OT LONG TERM GOAL #4   Title Pt will be perform updated HEP with min cues.   Time 4   Period Weeks   Status New     OT LONG TERM GOAL #5   Title Assess handwriting and set goal prn   Time 4   Period Weeks   Status Deferred  good size and legibility               Plan - 10/05/16 1024    Clinical Impression Statement Pt with inconsistent performance with large amplitude movement, particularly depending on fatigue.   Rehab Potential Good   OT Frequency 2x / week   OT Duration 4 weeks   OT Treatment/Interventions Self-care/ADL training;Cryotherapy;Parrafin;Therapeutic exercise;DME and/or AE instruction;Therapist, nutritional;Therapeutic activities;Patient/family education;Balance training;Cognitive remediation/compensation;Splinting;Manual Therapy;Neuromuscular education;Fluidtherapy;Ultrasound;Moist Heat;Energy conservation;Passive range of motion;Therapeutic exercises;Visual/perceptual remediation/compensation   Plan check remaining goals and discharge   OT Home Exercise Plan Education provided:  coordination HEP   Consulted and Agree with Plan of Care Patient      Patient will benefit from skilled therapeutic intervention in order to improve the following deficits and impairments:  Decreased balance, Abnormal gait, Decreased mobility, Difficulty walking, Impaired vision/preception, Impaired tone, Decreased cognition, Decreased activity tolerance, Decreased coordination, Decreased knowledge of use of DME, Impaired UE functional use, Improper spinal/pelvic alignment  Visit Diagnosis: Other symptoms and signs involving the nervous system  Other symptoms and signs involving the musculoskeletal system  Attention and concentration deficit  Other lack of coordination  Other abnormalities of gait and mobility  Abnormal posture  Unsteadiness on feet  Visuospatial deficit    Problem List Patient Active Problem List   Diagnosis Date Noted  .  Encounter for therapeutic drug monitoring 01/05/2016  . Paroxysmal atrial fibrillation ([place]) 12/24/2015  . Cerebral infarction due to embolism of left middle cerebral artery ([place]) 06/02/2015  . HLD (hyperlipidemia) 06/02/2015  . PD (Parkinson's disease) ([place]) 06/02/2015  . Mitral regurgitation 03/17/2015  . CVA (cerebral infarction)   . TIA (transient ischemic attack) 03/05/2015  . Slurred speech 03/05/2015  . Anxiety 12/19/2013  . Parkinson's disease ([place]) 08/06/2013  . Dyslipidemia 08/06/2013  . S/P mitral valve repair 08/06/2013    [facility] 10/05/2016, 11:00 AM  [facility] [address] [place], [place], [address] Phone: [phone]   Fax:  [phone]  Name: Mihailo Sage MRN: [id number] Date of Birth: 02/16/1932   Vianne Bulls, OTR/L [facility] [address] [place], [place]  [address] [phone] phone [phone] 10/05/16 11:23 AM

## 2016-10-05 NOTE — Therapy (Signed)
[facility] [address] [place], [place], [address] Phone: [phone]   Fax:  [phone]  Physical Therapy Treatment  Patient Details  Name: [name] [name] MRN: [id number] Date of Birth: [birth year] Referring Provider: Joylene Draft  Encounter Date: 10/05/2016      PT End of Session - 10/05/16 2010    Visit Number 6   Number of Visits 17   Date for PT Re-Evaluation 22/48/25  per recert 00/37/04   Authorization Type UHC Medicare-GCODE every 10th visit   PT Start Time [phone]  Pt arrives late due to Coumadin check appt   PT Stop Time 1016   PT Time Calculation (min) 34 min   Activity Tolerance Patient tolerated treatment well  Several seated rest breaks during session   Behavior During Therapy [facility] for tasks assessed/performed      Past Medical History:  Diagnosis Date  . Dyslipidemia   . Parkinson's disease ([place])   . S/P mitral valve replacement    mitral regurg, endocarditis  . Stroke [facility])     Past Surgical History:  Procedure Laterality Date  . CATARACT EXTRACTION  2009, 2012   right 2009, left 2012  . LOOP RECORDER IMPLANT Left 03/13/2015   Procedure: LOOP RECORDER IMPLANT;  Surgeon: Evans Lance, MD;  Location: [facility] CATH LAB;  Service: Cardiovascular;  Laterality: Left;  . MITRAL VALVE ANNULOPLASTY  04/10/2007    84m Edwards ring; r/t h/o MR and flail mitral leaflets due to endocarditis: MRI safe  . TEE WITHOUT CARDIOVERSION N/A 03/13/2015   Procedure: TRANSESOPHAGEAL ECHOCARDIOGRAM (TEE);  Surgeon: DLarey Dresser MD;  Location[place] [facility]  Service: Cardiovascular;  Laterality: N/A;  . TRANSTHORACIC ECHOCARDIOGRAM  08/15/2012   EF=>55%; normal LV systolic function; RV mildly dilated & systolic function mildly reduced; RA mod dilated; mild -mod MR & mildy increased gradients; mild TR; AV mildly sclerotic with mild-mod regurg    There were no vitals filed for this visit.      Subjective Assessment - 10/05/16  0946    Subjective Went to Dr. SLinus Makothe other day and had a pretty good report.  Would like to come back to therapy maybe after the holidays.  No falls  Pt arrived late due to Coumadin check appt.   Patient Stated Goals Pt's goals are to help with the stutter steps.   Currently in Pain? No/denies                         [facility] 0952      Transfers   Transfers Sit to Stand;Stand to Sit   Sit to Stand 7: Independent;Without upper extremity assist;From chair/3-in-1   Stand to Sit 7: Independent;Without upper extremity assist;To chair/3-in-1   Number of Reps 10 reps;Other sets (comment)  from 18" surfaces, 16' surfaces, then 14" soft surface   Comments Discuss strategy at low, soft sofa at home, where he goes to floor to half kneel>then up to stand, with cues for widened BOS upon standing.     Standardized Balance Assessment   Standardized Balance Assessment Timed Up and Go Test     Timed Up and Go Test   TUG Normal TUG;Cognitive TUG   Normal TUG (seconds) 11.88   Cognitive TUG (seconds) 11.79           PWR ([facility]) - 10/05/16 1001    PWR! exercises Moves in standing   PWR! Up x 20   PWR!  Rock x 20   PWR! Twist x 20   PWR Step x20  Side, forward, back, step and weigthshift x 10 reps   Comments Very minimal cues for posture, technique.  Overall, pt demo understanding of HEP with PWR! Moves in standing.             PT Education - 10/05/16 2009    Education provided Yes   Education Details HEP additions-forward and backwards step and weigthshift-see instructions   Person(s) Educated Patient   Methods Explanation;Demonstration;Handout   Comprehension Verbalized understanding;Returned demonstration;Verbal cues required          PT Short Term Goals - 10/05/16 0950      PT SHORT TERM GOAL #1   Title Pt will perform HEP for improved posture, balance, and gait with wife's supervision.  Updated TARGET 10/13/16    Time 4   Period Weeks   Status Achieved     PT SHORT TERM GOAL #2   Title Pt will perform at least 8 of 10 reps of sit<>stand transfers with proper, safe technique for improved efficiency and safety with transfers.   Time 4   Period Weeks   Status Achieved     PT SHORT TERM GOAL #3   Title Pt will verbalize/demonstrate understanding of tips to reduce festinating and freezing with gait.   Time 4   Period Weeks   Status On-going     PT SHORT TERM GOAL #4   Title Pt will improve TUG and TUG cognitive to within 10% to demonstrate improved ability for dual tasking.   Baseline 11.88 sec TUG, 11.79 sec TUG cognitive   Time 4   Period Weeks   Status Achieved           PT Long Term Goals - 09/14/16 1427      PT LONG TERM GOAL #1   Title Pt/wife will verbalize understanding of fall prevention within home environment.  UPdated TARGET 11/12/16   Time 8   Period Weeks   Status New     PT LONG TERM GOAL #2   Title Pt will improve MiniBESTest to at least 20/28 for improved balance/decreased fall risk.   Time 8   Period Weeks   Status New     PT LONG TERM GOAL #3   Title Pt will demonstrate improved ability for dual tasking by ambulating at least 500 ft on indoor/outdoor surfaces, while carrying items, modified independently.   Time 8   Period Weeks   Status New     PT LONG TERM GOAL #4   Title Pt will negotiate curb step modified independently, no LOB, 5 of 5 trials, for improved curb negotiation.   Time 8   Period Weeks   Status New     PT LONG TERM GOAL #5   Title Pt will verbalize plans for continued community fitness upon D/C from PT.   Time 8   Period Weeks   Status New               Plan - 10/05/16 2010    Clinical Impression Statement Pt has met STG 1, 2, 4 checked this visit.  STG 3 ongoing.  Discussed POC and pt unsure if he wants to discharge next visit or continue PT (he may be out of town several times in next month).  He is trying to perform HEP  consistently at home.  Discussed benefit of consistent attendance to therapies as well as HEP performance.  Rehab Potential Good   PT Frequency 2x / week   PT Duration 8 weeks  per recert 46/12/43   PT Treatment/Interventions ADLs/Self Care Home Management;Functional mobility training;Gait training;DME Instruction;Therapeutic activities;Therapeutic exercise;Balance training;Neuromuscular re-education;Patient/family education   PT Next Visit Plan gait activities and dual tasking/check remaining goals-discuss d/c versus continuing PT to address balance and gait   Consulted and Agree with Plan of Care Patient      Patient will benefit from skilled therapeutic intervention in order to improve the following deficits and impairments:  Abnormal gait, Decreased balance, Decreased mobility, Decreased safety awareness, Decreased strength, Difficulty walking, Postural dysfunction  Visit Diagnosis: Other symptoms and signs involving the nervous system  Unsteadiness on feet     Problem List Patient Active Problem List   Diagnosis Date Noted  . Encounter for therapeutic drug monitoring 01/05/2016  . Paroxysmal atrial fibrillation ([place]) 12/24/2015  . Cerebral infarction due to embolism of left middle cerebral artery ([place]) 06/02/2015  . HLD (hyperlipidemia) 06/02/2015  . PD (Parkinson's disease) ([place]) 06/02/2015  . Mitral regurgitation 03/17/2015  . CVA (cerebral infarction)   . TIA (transient ischemic attack) 03/05/2015  . Slurred speech 03/05/2015  . Anxiety 12/19/2013  . Parkinson's disease ([place]) 08/06/2013  . Dyslipidemia 08/06/2013  . S/P mitral valve repair 08/06/2013    Theodore Virgin W. 10/05/2016, 8:14 PM  Frazier Butt., PT  [facility] [address] [place], [place], [address] Phone: [phone]   Fax:  [phone]  Name: [name] [name] MRN: [id number] Date of Birth: 1932-03-30

## 2016-10-05 NOTE — Patient Instructions (Signed)
HEP provided:  Standing forward step and weigthshift x 10 reps   Standing back step and weightshift x 10 reps  With UE support at counter, 1-2 times per day

## 2016-10-07 ENCOUNTER — Ambulatory Visit: Payer: Medicare Other | Admitting: Occupational Therapy

## 2016-10-07 ENCOUNTER — Ambulatory Visit: Payer: Medicare Other | Admitting: Physical Therapy

## 2016-10-07 DIAGNOSIS — R29898 Other symptoms and signs involving the musculoskeletal system: Secondary | ICD-10-CM

## 2016-10-07 DIAGNOSIS — R293 Abnormal posture: Secondary | ICD-10-CM

## 2016-10-07 DIAGNOSIS — R2681 Unsteadiness on feet: Secondary | ICD-10-CM

## 2016-10-07 DIAGNOSIS — R2689 Other abnormalities of gait and mobility: Secondary | ICD-10-CM

## 2016-10-07 DIAGNOSIS — R29818 Other symptoms and signs involving the nervous system: Secondary | ICD-10-CM

## 2016-10-07 DIAGNOSIS — R41840 Attention and concentration deficit: Secondary | ICD-10-CM

## 2016-10-07 DIAGNOSIS — R278 Other lack of coordination: Secondary | ICD-10-CM

## 2016-10-07 NOTE — Patient Instructions (Addendum)
It is important to avoid accidents which may result in broken bones.  Here are a few ideas on how to make your home safer so you will be less likely to trip or fall.  1. Use nonskid mats or non slip strips in your shower or tub, on your bathroom floor and around sinks.  If you know that you have spilled water, wipe it up! 2. In the bathroom, it is important to have properly installed grab bars on the walls or on the edge of the tub.  Towel racks are NOT strong enough for you to hold onto or to pull on for support. 3. Stairs and hallways should have enough light.  Add lamps or night lights if you need ore light. 4. It is good to have handrails on both sides of the stairs if possible.  Always fix broken handrails right away. 5. It is important to see the edges of steps.  Paint the edges of outdoor steps white so you can see them better.  Put colored tape on the edge of inside steps. 6. Throw-rugs are dangerous because they can slide.  Removing the rugs is the best idea, but if they must stay, add adhesive carpet tape to prevent slipping. 7. Do not keep things on stairs or in the halls.  Remove small furniture that blocks the halls as it may cause you to trip.  Keep telephone and electrical cords out of the way where you walk. 8. Always were sturdy, rubber-soled shoes for good support.  Never wear just socks, especially on the stairs.  Socks may cause you to slip or fall.  Do not wear full-length housecoats as you can easily trip on the bottom.  9. Place the things you use the most on the shelves that are the easiest to reach.  If you use a stepstool, make sure it is in good condition.  If you feel unsteady, DO NOT climb, ask for help. 10. If a health professional advises you to use a cane or walker, do not be ashamed.  These items can keep you from falling and breaking your bones.   Fall Prevention in the Home Introduction Falls can cause injuries. They can happen to people of all ages. There are many  things you can do to make your home safe and to help prevent falls. What can I do on the outside of my home?  Regularly fix the edges of walkways and driveways and fix any cracks.  Remove anything that might make you trip as you walk through a door, such as a raised step or threshold.  Trim any bushes or trees on the path to your home.  Use bright outdoor lighting.  Clear any walking paths of anything that might make someone trip, such as rocks or tools.  Regularly check to see if handrails are loose or broken. Make sure that both sides of any steps have handrails.  Any raised decks and porches should have guardrails on the edges.  Have any leaves, snow, or ice cleared regularly.  Use sand or salt on walking paths during winter.  Clean up any spills in your garage right away. This includes oil or grease spills. What can I do in the bathroom?  Use night lights.  Install grab bars by the toilet and in the tub and shower. Do not use towel bars as grab bars.  Use non-skid mats or decals in the tub or shower.  If you need to sit down in the shower,   use a plastic, non-slip stool.  Keep the floor dry. Clean up any water that spills on the floor as soon as it happens.  Remove soap buildup in the tub or shower regularly.  Attach bath mats securely with double-sided non-slip rug tape.  Do not have throw rugs and other things on the floor that can make you trip. What can I do in the bedroom?  Use night lights.  Make sure that you have a light by your bed that is easy to reach.  Do not use any sheets or blankets that are too big for your bed. They should not hang down onto the floor.  Have a firm chair that has side arms. You can use this for support while you get dressed.  Do not have throw rugs and other things on the floor that can make you trip. What can I do in the kitchen?  Clean up any spills right away.  Avoid walking on wet floors.  Keep items that you use a lot  in easy-to-reach places.  If you need to reach something above you, use a strong step stool that has a grab bar.  Keep electrical cords out of the way.  Do not use floor polish or wax that makes floors slippery. If you must use wax, use non-skid floor wax.  Do not have throw rugs and other things on the floor that can make you trip. What can I do with my stairs?  Do not leave any items on the stairs.  Make sure that there are handrails on both sides of the stairs and use them. Fix handrails that are broken or loose. Make sure that handrails are as long as the stairways.  Check any carpeting to make sure that it is firmly attached to the stairs. Fix any carpet that is loose or worn.  Avoid having throw rugs at the top or bottom of the stairs. If you do have throw rugs, attach them to the floor with carpet tape.  Make sure that you have a light switch at the top of the stairs and the bottom of the stairs. If you do not have them, ask someone to add them for you. What else can I do to help prevent falls?  Wear shoes that:  Do not have high heels.  Have rubber bottoms.  Are comfortable and fit you well.  Are closed at the toe. Do not wear sandals.  If you use a stepladder:  Make sure that it is fully opened. Do not climb a closed stepladder.  Make sure that both sides of the stepladder are locked into place.  Ask someone to hold it for you, if possible.  Clearly mark and make sure that you can see:  Any grab bars or handrails.  First and last steps.  Where the edge of each step is.  Use tools that help you move around (mobility aids) if they are needed. These include:  Canes.  Walkers.  Scooters.  Crutches.  Turn on the lights when you go into a dark area. Replace any light bulbs as soon as they burn out.  Set up your furniture so you have a clear path. Avoid moving your furniture around.  If any of your floors are uneven, fix them.  If there are any pets  around you, be aware of where they are.  Review your medicines with your doctor. Some medicines can make you feel dizzy. This can increase your chance of falling. Ask your doctor   what other things that you can do to help prevent falls. This information is not intended to replace advice given to you by your health care provider. Make sure you discuss any questions you have with your health care provider. Document Released: 09/04/2009 Document Revised: 04/15/2016 Document Reviewed: 12/13/2014  2017 [employer]  

## 2016-10-07 NOTE — Therapy (Signed)
[facility] [address] [place], [place], [address] Phone: [phone]   Fax:  [phone]  Occupational Therapy Treatment  Patient Details  Name: [name] [name] MRN: [id number] Date of Birth: 1932-06-07 Referring Provider: Dr. Crist Infante  Encounter Date: 10/07/2016      OT End of Session - 10/07/16 1031    Visit Number 7   Number of Visits 12   Date for OT Re-Evaluation 10/09/16   Authorization Type [employer], no visit limit/auth, need G-code   Authorization Time Period cert. date 09/13/16-11/12/16   Authorization - Visit Number 7   Authorization - Number of Visits 10   OT Start Time 1024   OT Stop Time 1103   OT Time Calculation (min) 39 min   Activity Tolerance Patient tolerated treatment well   Behavior During Therapy WFL for tasks assessed/performed      Past Medical History:  Diagnosis Date  . Dyslipidemia   . Parkinson's disease ([place])   . S/P mitral valve replacement    mitral regurg, endocarditis  . Stroke [facility])     Past Surgical History:  Procedure Laterality Date  . CATARACT EXTRACTION  2009, 2012   right 2009, left 2012  . LOOP RECORDER IMPLANT Left 03/13/2015   Procedure: LOOP RECORDER IMPLANT;  Surgeon: Evans Lance, MD;  Location: [facility] CATH LAB;  Service: Cardiovascular;  Laterality: Left;  . MITRAL VALVE ANNULOPLASTY  04/10/2007    34m Edwards ring; r/t h/o MR and flail mitral leaflets due to endocarditis: MRI safe  . TEE WITHOUT CARDIOVERSION N/A 03/13/2015   Procedure: TRANSESOPHAGEAL ECHOCARDIOGRAM (TEE);  Surgeon: DLarey Dresser MD;  Location: [facility]  Service: Cardiovascular;  Laterality: N/A;  . TRANSTHORACIC ECHOCARDIOGRAM  08/15/2012   EF=>55%; normal LV systolic function; RV mildly dilated & systolic function mildly reduced; RA mod dilated; mild -mod MR & mildy increased gradients; mild TR; AV mildly sclerotic with mild-mod regurg    There were no vitals filed for this  visit.      Subjective Assessment - 10/07/16 1027    Subjective  wife reports that returning to therapy has "really helped"   Patient is accompained by: Family member   Patient Stated Goals improve coordination, balance   Currently in Pain? No/denies       Flipping large cards with each hand with focus on supination/finger ext for large amplitude movements with min v.c.  Sliding large cards across table using PWR! Hands for large amplitude movements with each hand with min v.c.  Reviewed that pt should be deliberate, but not fast when using PWR! Hands as strategy for ADLs.  Reviewed use of PWR! Hands with grasping cup and pt returned demo after cueing.  In sitting, functional reaching over head  To place small pegs in vertical pegboard with set-up for large amplitude movements to copy design for cognitive/visual component with min difficulty for coordination, but 100% accuracy with copying.    In standing, functional reaching  overhead/ER in diagonal pattern with step forward/back incorporating trunk rotation/wt. shift and PWR! reach to grasp/release cylinder objects with min cueing for large amplitude movements  In sitting,  functional reaching in diagonal pattern incorporating trunk rotation/wt. shift and PWR! Reach to toss scarves with PWR! Hands  With min cues for incr movment amplitude.                            OT Education - 10/07/16 1655  Education Details Recommendation for Screen (and process); Indications for return for therapy prior to screen; Recommendation to continue with PWR! classes and HEP; Review of ways to incorporate large amplitude movements into ADLs and how wife can encourage/remind pt to do this   Person(s) Educated Patient;Spouse   Methods Explanation   Comprehension Verbalized understanding          OT Short Term Goals - 08/10/16 2206      OT SHORT TERM GOAL #1   Title -------------------   Time --     OT SHORT TERM GOAL  #2   Title -----------------     OT SHORT TERM GOAL #3   Title -----------------     OT SHORT TERM GOAL #4   Title ------------------           OT Long Term Goals - 10-15-16 1034      OT LONG TERM GOAL #1   Title Pt will verbalize understanding of strategies/AE to assist with ADLs/IADLs prn.--check LTGs 01/11/16   Time 4   Period Weeks   Status Achieved     OT LONG TERM GOAL #2   Title Pt will improve coordination for ADLs as shown by improving time on 9-hole peg test by at least 4sec with RUE.   Baseline 37.72   Time 4   Period Weeks   Status Not Met  10/05/16:  47.34, 38.93sec     OT LONG TERM GOAL #3   Title Pt will improve UE functional reaching/coordination as shown by improving score on box and blocks test by at least 4 bilaterally.   Baseline R-42, L-41 blocks   Time 4   Period Weeks   Status Not Met  10/05/16:  R-41blocks, L-39blocks     OT LONG TERM GOAL #4   Title Pt will be perform updated HEP with min cues.   Time 4   Period Weeks   Status Achieved     OT LONG TERM GOAL #5   Title Assess handwriting and set goal prn   Time 4   Period Weeks   Status Deferred  good size and legibility               Plan - 10-15-2016 1032    Clinical Impression Statement Pt feels comfortable with HEP and perfomance with ADLs.     Rehab Potential Good   OT Frequency 2x / week   OT Duration 4 weeks   OT Treatment/Interventions Self-care/ADL training;Cryotherapy;Parrafin;Therapeutic exercise;DME and/or AE instruction;Therapist, nutritional;Therapeutic activities;Patient/family education;Balance training;Cognitive remediation/compensation;Splinting;Manual Therapy;Neuromuscular education;Fluidtherapy;Ultrasound;Moist Heat;Energy conservation;Passive range of motion;Therapeutic exercises;Visual/perceptual remediation/compensation   Plan d/c OT, OT screen in approx 4 months due to progressive nature of diagnosis   OT Home Exercise Plan Education provided:   coordination HEP   Consulted and Agree with Plan of Care Patient      Patient will benefit from skilled therapeutic intervention in order to improve the following deficits and impairments:  Decreased balance, Abnormal gait, Decreased mobility, Difficulty walking, Impaired vision/preception, Impaired tone, Decreased cognition, Decreased activity tolerance, Decreased coordination, Decreased knowledge of use of DME, Impaired UE functional use, Improper spinal/pelvic alignment  Visit Diagnosis: Other symptoms and signs involving the nervous system  Other symptoms and signs involving the musculoskeletal system  Attention and concentration deficit  Other lack of coordination  Other abnormalities of gait and mobility  Abnormal posture      G-Codes - 10/15/16 1659    Functional Assessment Tool Used 9-hole peg test:  R-47.34, 38.93sec;  Box  and blocks test:  R-41, L-39 blocks;   Verbalized understanding of updated HEP   Functional Limitation Carrying, moving and handling objects   Carrying, Moving and Handling Objects Goal Status ([id number]) At least 1 percent but less than 20 percent impaired, limited or restricted   Carrying, Moving and Handling Objects Discharge Status [phone]) At least 20 percent but less than 40 percent impaired, limited or restricted      Problem List Patient Active Problem List   Diagnosis Date Noted  . Encounter for therapeutic drug monitoring 01/05/2016  . Paroxysmal atrial fibrillation ([place]) 12/24/2015  . 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 HLD (hyperlipidemia) 06/02/2015  . PD (Parkinson's disease) ([place]) 06/02/2015  . Mitral regurgitation 03/17/2015  . CVA (cerebral infarction)   . TIA (transient ischemic attack) 03/05/2015  . Slurred speech 03/05/2015  . Anxiety 12/19/2013  . Parkinson's disease ([place]) 08/06/2013  . Dyslipidemia 08/06/2013  . S/P mitral valve repair 08/06/2013    OCCUPATIONAL THERAPY  DISCHARGE SUMMARY  Visits from Start of Care: 7  Current functional level related to goals / functional outcomes: See above   Remaining deficits: Bradykinesia, rigidity, decr posture, decr balance/functional mobility, decr coordination, cognitive deficits    Education / Equipment: Pt was instructed in the following:  Updated PD-specific HEP, adaptive strategies for ADLs/IADLs, ways to prevent future complications.  Pt verbalized understanding of all education provided.   Plan: Patient agrees to discharge.  Patient goals were partially met. Patient is being discharged due to                                                    reaching maximal rehab potential at this time.        [facility] 10/07/2016, 5:18 PM  [facility] [address] [place], [place], [address] Phone: [phone]   Fax:  [phone]  Name: [name] [name] MRN: [id number] Date of Birth: 02/18/1932   Vianne Bulls, OTR/L [facility] [address] [place], [place]  [address] [phone] phone [phone] 10/07/16 5:18 PM

## 2016-10-10 NOTE — Therapy (Signed)
[facility] [address] [place], [place], [address] Phone: [phone]   Fax:  [phone]  Physical Therapy Treatment  Patient Details  Name: [name] [name] MRN: [id number] Date of Birth: 01-Aug-1932 Referring Provider: Joylene Draft  Encounter Date: 10/07/2016      PT End of Session - 10/10/16 1453    Visit Number 7   Number of Visits 17   Date for PT Re-Evaluation 15/17/61  per recert 60/73/71   Authorization Type [employer]-GCODE every 10th visit   PT Start Time 0939   PT Stop Time 1019   PT Time Calculation (min) 40 min   Activity Tolerance Patient tolerated treatment well  Several seated rest breaks during session   Behavior During Therapy [facility] for tasks assessed/performed      Past Medical History:  Diagnosis Date  . Dyslipidemia   . Parkinson's disease ([place])   . S/P mitral valve replacement    mitral regurg, endocarditis  . Stroke [facility])     Past Surgical History:  Procedure Laterality Date  . CATARACT EXTRACTION  2009, 2012   right 2009, left 2012  . LOOP RECORDER IMPLANT Left 03/13/2015   Procedure: LOOP RECORDER IMPLANT;  Surgeon: Evans Lance, MD;  Location: [facility] CATH LAB;  Service: Cardiovascular;  Laterality: Left;  . MITRAL VALVE ANNULOPLASTY  04/10/2007    37m Edwards ring; r/t h/o MR and flail mitral leaflets due to endocarditis: MRI safe  . TEE WITHOUT CARDIOVERSION N/A 03/13/2015   Procedure: TRANSESOPHAGEAL ECHOCARDIOGRAM (TEE);  Surgeon: DLarey Dresser MD;  [place]  Service: Cardiovascular;  Laterality: N/A;  . TRANSTHORACIC ECHOCARDIOGRAM  08/15/2012   EF=>55%; normal LV systolic function; RV mildly dilated & systolic function mildly reduced; RA mod dilated; mild -mod MR & mildy increased gradients; mild TR; AV mildly sclerotic with mild-mod regurg    There were no vitals filed for this visit.      Subjective Assessment - 10/10/16 1446    Subjective No new changes or  complaints.   Patient Stated Goals Pt's goals are to help with the stutter steps.   Currently in Pain? No/denies                         [facility] - [facility] 1448      Ambulation/Gait   Ambulation/Gait Yes   Ambulation/Gait Assistance 6: Modified independent (Device/Increase time)   Ambulation Distance (Feet) 400 Feet  then 500 ft carryin object with obstacles-see below   Assistive device None   Gait Pattern Step-through pattern;Decreased arm swing - right;Decreased arm swing - left;Trunk flexed;Narrow base of support  cues for upright posture   Ambulation Surface Level;Indoor  simulating outdoor surfaces   Curb 7: Independent;Other (comment)  x 6 reps no LOB   Pre-Gait Activities REviewed HEP from last visits-stepping forward/back-pt requires minimal cues for technique   Gait Comments MiniBESTtest score:  26/28-see full note for details.     Self-Care   Self-Care Other Self-Care Comments   Other Self-Care Comments  Fall prevention education provided and discussed with patient, both in relation to home and to [place] discussed/reviewed tips to reduce freezing with gait.  Pt verbalizes improved awareness of posture and strategies to reduce freezing/festinating episodes.  Discussed progress towards goals, (all goals achieved) and plan to discharge this visit.  Plan return screen in March 2018.  Pt in agreement.       Mini-BESTest: Balance Evaluation Systems Test  2005-2013 [employer]. All rights reserved. ________________________________________________________________________________________Anticipatory_________Subscore__5___/6 1. SIT TO STAND Instruction: "Cross your arms across your chest. Try not to use your hands unless you must.Do not let your legs lean against the back of the chair when you stand. Please stand up now." X(2) Normal: Comes to stand without use of hands and stabilizes independently. (1)  Moderate: Comes to stand WITH use of hands on first attempt. (0) Severe: Unable to stand up from chair without assistance, OR needs several attempts with use of hands. 2. RISE TO TOES Instruction: "Place your feet shoulder width apart. Place your hands on your hips. Try to rise as high as you can onto your toes. I will count out loud to 3 seconds. Try to hold this pose for at least 3 seconds. Look straight ahead. Rise now." X1(2) Normal: Stable for 3 s with maximum height. (1) Moderate: Heels up, but not full range (smaller than when holding hands), OR noticeable instability for 3 s. (0) Severe: < 3 s. 3. STAND ON ONE LEG Instruction: "Look straight ahead. Keep your hands on your hips. Lift your leg off of the ground behind you without touching or resting your raised leg upon your other standing leg. Stay standing on one leg as long as you can. Look straight ahead. Lift now." Left: Time in Seconds Trial 1:__1.94 ___Trial 2:____1.51_ (2) Normal: 20 s. X(1) Moderate: < 20 s. (0) Severe: Unable. Right: Time in Seconds Trial 1:___1.25__Trial 2:__1.55___ (2) Normal: 20 s. X(1) Moderate: < 20 s. (0) Severe: Unable To score each side separately use the trial with the longest time. To calculate the sub-score and total score use the side [left or right] with the lowest numerical score [i.e. the worse side]. ______________________________________________________________________________________Reactive Postural Control___________Subscore:___6__/6 4. COMPENSATORY STEPPING CORRECTION- FORWARD Instruction: "Stand with your feet shoulder width apart, arms at your sides. Lean forward against my hands beyond your forward limits. When I let go, do whatever is necessary, including taking a step, to avoid a fall." X(2) Normal: Recovers independently with a single, large step (second realignment step is allowed). (1) Moderate: More than one step used to recover equilibrium. (0) Severe: No step, OR would fall  if not caught, OR falls spontaneously. 5. COMPENSATORY STEPPING CORRECTION- BACKWARD Instruction: "Stand with your feet shoulder width apart, arms at your sides. Lean backward against my hands beyond your backward limits. When I let go, do whatever is necessary, including taking a step, to avoid a fall." X(2) Normal: Recovers independently with a single, large step. (1) Moderate: More than one step used to recover equilibrium. (0) Severe: No step, OR would fall if not caught, OR falls spontaneously. 6. COMPENSATORY STEPPING CORRECTION- LATERAL Instruction: "Stand with your feet together, arms down at your sides. Lean into my hand beyond your sideways limit. When I let go, do whatever is necessary, including taking a step, to avoid a fall." Left X(2) Normal: Recovers independently with 1 step (crossover or lateral OK). (1) Moderate: Several steps to recover equilibrium. (0) Severe: Falls, or cannot step. Right X(2) Normal: Recovers independently with 1 step (crossover or lateral OK). (1) Moderate: Several steps to recover equilibrium. (0) Severe: Falls, or cannot step. Use the side with the lowest score to calculate sub-score and total score. ____________________________________________________________________________________Sensory Orientation_____________Subscore:____6_____/6 7. STANCE (FEET TOGETHER); EYES OPEN, FIRM SURFACE Instruction: "Place your hands on your hips. Place your feet together until almost touching. Look straight ahead. Be as stable and still as possible, until I say stop." Time in seconds:________  X(2) Normal: 30 s. (1) Moderate: < 30 s. (0) Severe: Unable. 8. STANCE (FEET TOGETHER); EYES CLOSED, FOAM SURFACE Instruction: "Step onto the foam. Place your hands on your hips. Place your feet together until almost touching. Be as stable and still as possible, until I say stop. I will start timing when you close your eyes." Time in seconds:________ X(2) Normal: 30  s. (1) Moderate: < 30 s. (0) Severe: Unable. 9. INCLINE- EYES CLOSED Instruction: "Step onto the incline ramp. Please stand on the incline ramp with your toes toward the top. Place your feet shoulder width apart and have your arms down at your sides. I will start timing when you close your eyes." Time in seconds:________ X(2) Normal: Stands independently 30 s and aligns with gravity. (1) Moderate: Stands independently <30 s OR aligns with surface. (0) Severe: Unable. _________________________________________________________________________________________Dynamic Gait ______Subscore_____9___/10 10. CHANGE IN GAIT SPEED Instruction: "Begin walking at your normal speed, when I tell you 'fast', walk as fast as you can. When I say 'slow', walk very slowly." X(2) Normal: Significantly changes walking speed without imbalance. (1) Moderate: Unable to change walking speed or signs of imbalance. (0) Severe: Unable to achieve significant change in walking speed AND signs of imbalance. [place] - HORIZONTAL Instruction: "Begin walking at your normal speed, when I say "right", turn your head and look to the right. When I say "left" turn your head and look to the left. Try to keep yourself walking in a straight line." (2) Normal: performs head turns with no change in gait speed and good balance. X(1) Moderate: performs head turns with reduction in gait speed. (0) Severe: performs head turns with imbalance. 12. WALK WITH PIVOT TURNS Instruction: "Begin walking at your normal speed. When I tell you to 'turn and stop', turn as quickly as you can, face the opposite direction, and stop. After the turn, your feet should be close together." X(2) Normal: Turns with feet close FAST (< 3 steps) with good balance. (1) Moderate: Turns with feet close SLOW (>4 steps) with good balance. (0) Severe: Cannot turn with feet close at any speed without imbalance. 13. STEP OVER OBSTACLES Instruction:  "Begin walking at your normal speed. When you get to the box, step over it, not around it and keep walking." X(2) Normal: Able to step over box with minimal change of gait speed and with good balance. (1) Moderate: Steps over box but touches box OR displays cautious behavior by slowing gait. (0) Severe: Unable to step over box OR steps around box. 14. TIMED UP & GO WITH DUAL TASK [3 METER WALK] Instruction TUG: "When I say 'Go', stand up from chair, walk at your normal speed across the tape on the floor, turn around, and come back to sit in the chair." Instruction TUG with Dual Task: "Count backwards by threes starting at ___. When I say 'Go', stand up from chair, walk at your normal speed across the tape on the floor, turn around, and come back to sit in the chair. Continue counting backwards the entire time." TUG: ___11.62_____seconds; Dual Task TUG: ___12.56_____seconds X(2) Normal: No noticeable change in sitting, standing or walking while backward counting when compared to TUG without Dual Task. (1) Moderate: Dual Task affects either counting OR walking (>10%) when compared to the TUG without Dual Task. (0) Severe: Stops counting while walking OR stops walking while counting. When scoring item 14, if subject's gait speed slows more than 10% between the TUG without and with a  Dual Task the score should be decreased by a point. TOTAL SCORE: __26______/28            PT Short Term Goals - 10/10/16 1446      PT SHORT TERM GOAL #1   Title Pt will perform HEP for improved posture, balance, and gait with wife's supervision.  Updated TARGET 10/13/16   Time 4   Period Weeks   Status Achieved     PT SHORT TERM GOAL #2   Title Pt will perform at least 8 of 10 reps of sit<>stand transfers with proper, safe technique for improved efficiency and safety with transfers.   Time 4   Period Weeks   Status Achieved     PT SHORT TERM GOAL #3   Title Pt will verbalize/demonstrate  understanding of tips to reduce festinating and freezing with gait.   Time 4   Period Weeks   Status Achieved     PT SHORT TERM GOAL #4   Title Pt will improve TUG and TUG cognitive to within 10% to demonstrate improved ability for dual tasking.   Baseline 11.88 sec TUG, 11.79 sec TUG cognitive   Time 4   Period Weeks   Status Achieved           PT Long Term Goals - 10/07/16 4696      PT LONG TERM GOAL #1   Title Pt/wife will verbalize understanding of fall prevention within home environment.  UPdated TARGET 11/12/16   Time 8   Period Weeks   Status Achieved     PT LONG TERM GOAL #2   Title Pt will improve MiniBESTest to at least 20/28 for improved balance/decreased fall risk.   Baseline 26/28 on 10/07/16   Time 8   Period Weeks   Status Achieved     PT LONG TERM GOAL #3   Title Pt will demonstrate improved ability for dual tasking by ambulating at least 500 ft on indoor/outdoor surfaces, while carrying items, modified independently.   Time 8   Period Weeks   Status Achieved     PT LONG TERM GOAL #4   Title Pt will negotiate curb step modified independently, no LOB, 5 of 5 trials, for improved curb negotiation.   Time 8   Period Weeks   Status Achieved     PT LONG TERM GOAL #5   Title Pt will verbalize plans for continued community fitness upon D/C from PT.   Time 8   Period Weeks   Status Achieved               Plan - 10/10/16 1454    Clinical Impression Statement Pt has met all STGs and all LTGs.  Pt has demonstrated progress in functional mobility measures, and he reports improved awareness of posture and movement patterns.  Pt is in agreement with discharge and is appropriate for discharge this visit.   Rehab Potential Good   PT Frequency 2x / week   PT Duration 8 weeks  per recert 29/52/84   PT Treatment/Interventions ADLs/Self Care Home Management;Functional mobility training;Gait training;DME Instruction;Therapeutic activities;Therapeutic  exercise;Balance training;Neuromuscular re-education;Patient/family education   PT Next Visit Plan Discharge this visit; return screen in 4-6 months   Consulted and Agree with Plan of Care Patient      Patient will benefit from skilled therapeutic intervention in order to improve the following deficits and impairments:  Abnormal gait, Decreased balance, Decreased mobility, Decreased safety awareness, Decreased strength, Difficulty walking, Postural dysfunction  Visit Diagnosis:  Other abnormalities of gait and mobility  Unsteadiness on feet     Problem List Patient Active Problem List   Diagnosis Date Noted  . Encounter for therapeutic drug monitoring 01/05/2016  . Paroxysmal atrial fibrillation ([place]) 12/24/2015  . Cerebral infarction due to embolism of left middle cerebral artery ([place]) 06/02/2015  . HLD (hyperlipidemia) 06/02/2015  . PD (Parkinson's disease) ([place]) 06/02/2015  . Mitral regurgitation 03/17/2015  . CVA (cerebral infarction)   . TIA (transient ischemic attack) 03/05/2015  . Slurred speech 03/05/2015  . Anxiety 12/19/2013  . Parkinson's disease ([place]) 08/06/2013  . Dyslipidemia 08/06/2013  . S/P mitral valve repair 08/06/2013    Peder Allums W. 10/10/2016, 2:58 PM  Frazier Butt., PT  [facility] [address] [place], [place], [address] Phone: [phone]   Fax:  [phone]  Name: [name] [name] MRN: [id number] Date of Birth: Jul 02, 1932  PHYSICAL THERAPY DISCHARGE SUMMARY  Visits from Start of Care: 7  Current functional level related to goals / functional outcomes: See above for short and long term goal assessment-pt has met all STGs and all LTGs.   Remaining deficits: Posture, timing and coordination of gait, balance   Education / Equipment: Pt educated in HEP, fall prevention, community fitness and tips to reduce freezing with gait.  Plan: Patient agrees to discharge.  Patient goals were  met. Patient is being discharged due to meeting the stated rehab goals.  ?????Recommend return PD screen for PT in 4-6 months due to progressive nature of disease.  Mady Haagensen, PT 10/10/16 3:03 PM Phone: [phone] Fax: [phone]

## 2016-10-19 ENCOUNTER — Ambulatory Visit: Payer: Medicare Other | Admitting: Neurology

## 2016-11-01 ENCOUNTER — Ambulatory Visit: Payer: Medicare Other | Admitting: Neurology

## 2016-11-02 ENCOUNTER — Ambulatory Visit (INDEPENDENT_AMBULATORY_CARE_PROVIDER_SITE_OTHER): Payer: Medicare Other | Admitting: *Deleted

## 2016-11-02 DIAGNOSIS — I63412 Cerebral infarction due to embolism of left middle cerebral artery: Secondary | ICD-10-CM

## 2016-11-03 NOTE — Progress Notes (Signed)
Carelink Summary Report / Loop Recorder 

## 2016-11-12 ENCOUNTER — Ambulatory Visit (INDEPENDENT_AMBULATORY_CARE_PROVIDER_SITE_OTHER): Payer: Medicare Other | Admitting: *Deleted

## 2016-11-12 DIAGNOSIS — I63412 Cerebral infarction due to embolism of left middle cerebral artery: Secondary | ICD-10-CM

## 2016-11-12 DIAGNOSIS — G458 Other transient cerebral ischemic attacks and related syndromes: Secondary | ICD-10-CM

## 2016-11-12 DIAGNOSIS — I480 Paroxysmal atrial fibrillation: Secondary | ICD-10-CM

## 2016-11-12 DIAGNOSIS — Z5181 Encounter for therapeutic drug level monitoring: Secondary | ICD-10-CM

## 2016-11-12 DIAGNOSIS — Z9889 Other specified postprocedural states: Secondary | ICD-10-CM

## 2016-11-12 LAB — POCT INR: INR: 2.7

## 2016-11-17 LAB — CUP PACEART REMOTE DEVICE CHECK
Implantable Pulse Generator Implant Date: 20160421
MDC IDC SESS DTM: 20171112194155

## 2016-12-02 ENCOUNTER — Ambulatory Visit (INDEPENDENT_AMBULATORY_CARE_PROVIDER_SITE_OTHER): Payer: Medicare Other | Admitting: *Deleted

## 2016-12-02 DIAGNOSIS — I63412 Cerebral infarction due to embolism of left middle cerebral artery: Secondary | ICD-10-CM

## 2016-12-03 NOTE — Progress Notes (Signed)
Carelink Summary Report / Loop Recorder 

## 2016-12-14 ENCOUNTER — Ambulatory Visit (INDEPENDENT_AMBULATORY_CARE_PROVIDER_SITE_OTHER): Payer: Medicare Other | Admitting: Neurology

## 2016-12-14 ENCOUNTER — Encounter: Payer: Self-pay | Admitting: Neurology

## 2016-12-14 VITALS — BP 95/63 | HR 68 | Wt 184.5 lb

## 2016-12-14 DIAGNOSIS — I63412 Cerebral infarction due to embolism of left middle cerebral artery: Secondary | ICD-10-CM

## 2016-12-14 DIAGNOSIS — G20 Parkinson's disease: Secondary | ICD-10-CM

## 2016-12-14 DIAGNOSIS — I480 Paroxysmal atrial fibrillation: Secondary | ICD-10-CM

## 2016-12-14 DIAGNOSIS — E785 Hyperlipidemia, unspecified: Secondary | ICD-10-CM | POA: Diagnosis not present

## 2016-12-14 DIAGNOSIS — G20A1 Parkinson's disease without dyskinesia, without mention of fluctuations: Secondary | ICD-10-CM

## 2016-12-14 DIAGNOSIS — Z9889 Other specified postprocedural states: Secondary | ICD-10-CM | POA: Diagnosis not present

## 2016-12-14 NOTE — Progress Notes (Signed)
STROKE NEUROLOGY FOLLOW UP NOTE  NAME: [name] [name] DOB: [birth year]  REASON FOR VISIT: stroke follow up HISTORY FROM: chart and wife  Today we had the pleasure of seeing [name] [name] in follow-up at our Neurology Clinic. Pt was accompanied by wife.   History Summary [name] [name] is a 83 y.o. male with history of Parkinson's disease followed with [facility], mitral valve replacement in 2008 and dyslipidemia, who developed acute onset of right facial droop and expressive aphasia. He did not receive IV t-PA due to rapidly resolving deficits. Stroke work up so far negative except MRI showed left frontal cortical small and subtle infarct. Concerning for initial large clot breaking off with symptoms improving. TEE also negative for clot or PFO. Loop recorder placed. He was discharged with ASA and lipitor.  Follow up 06/02/15 - the patient has been doing well, no recurrent symptoms. For loop recorder monitoring, so far he has not been called by cardiology. However, on the chart it seems there was on suspicious episode of AF detect in May, will need to clarify with cardiology. BP 104/64 in clinic today. He continues to follow up with Dr. Daphene Calamity in [facility] for PD treatment.   Follow up 10/06/15 - pt has been doing well. No recurrent symptoms. Loop recorder did not show afib and he follows with Dr. Shirlee Latch and confirmed no afib episodes. He is continued on ASA and lipitor. Continued follow up with Dr. Daphene Calamity in [facility] for PD. Currently put on aricept. BP 96/61 which was his usual BP. He is not on BP meds. No other complains. He declined RESPECT ESUS trial back in 05/2015.  Follow up 04/21/16 - pt has been doing well from stroke standpoint. No recurrent stroke like symptoms. Loop recorder found runs of afib with RVR in 12/2015. Followed with Dr. Shirlee Latch and he was put on coumadin due to valvular afib. INR remains in good range. Last check on 04/12/16 INR 3.2. His BP today 92/55, asymptomatic. However, wife stated that  sometimes he felt groggy in the morning after getting up or after a nap getting up, did not check BP at that time. He continues following with Dr. Delfina Redwood at [facility] for PD.   Interval History During the interval time, pt has been doing well. Continued on coumadin and last INR 2.5. Very stable INR. He still follows with Dr. Daphene Calamity for PD at [facility]. BP still low and today 95/63, asymptomatic. Wife stated that pt BP always low. Last visit his BP 100s. Wife checked orthostatic vital once at home and was negative.   REVIEW OF SYSTEMS: Full 14 system review of systems performed and notable only for those listed below and in HPI above, all others are negative:  Constitutional:   Cardiovascular:  Ear/Nose/Throat:   Skin:  Eyes:   Respiratory:   Gastroitestinal:   Genitourinary:  Hematology/Lymphatic:   Endocrine:  Musculoskeletal:    Allergy/Immunology:   Neurological:  PD Psychiatric:  Sleep:   The following represents the patient's updated allergies and side effects list: Allergies  Allergen Reactions  . Ace Inhibitors Cough  . Demerol [Meperidine] Other (See Comments)    Parkinsons disease  . Poison Ivy Extract     Other reaction(s): Other (See Comments) No reaction listed    The neurologically relevant items on the patient's problem list were reviewed on today's visit.  Neurologic Examination  A problem focused neurological exam (12 or more points of the single system neurologic examination, vital signs counts as 1 point, cranial nerves  count for 8 points) was performed.  Blood pressure 95/63, pulse 68, weight 184 lb 8 oz (83.7 kg).   General - Well nourished, well developed, in no apparent distress.  Ophthalmologic - fundi not visualized due to small pupils.  Cardiovascular - Regular rate and rhythm.  Mental Status -  Level of arousal and orientation to time, place, and person were intact. Language including expression, naming, repetition, comprehension was assessed and  found intact. Fund of Knowledge was assessed and was intact.  Cranial Nerves II - XII - II - Visual field intact OU. III, IV, VI - Extraocular movements intact. V - Facial sensation intact bilaterally. VII - Facial movement intact bilaterally, however, mild right nasolabial fold flattening. VIII - Hearing & vestibular intact bilaterally. X - Palate elevates symmetrically. XI - Chin turning & shoulder shrug intact bilaterally. XII - Tongue protrusion intact.  Motor Strength - The patient's strength was normal in all extremities and pronator drift was absent. Bulk was normal and fasciculations were absent.  Motor Tone - Muscle tone was assessed at the neck and appendages and was normal and no rigidity.  Reflexes - The patient's reflexes were 1+ in all extremities and he had no pathological reflexes.  Sensory - Light touch, temperature/pinprick were assessed and were symmetrical.   Coordination - The patient had normal movements in the hands with no ataxia but with significant resting and action tremor. Pt also has dyskinesia throughout the body including neck, arms and legs.   Gait and Station - stooped posturing, slow small stride, mild shuffling gait, has cane but able to walk without cane.  Data reviewed: I personally reviewed the images and agree with the radiology interpretations.  Dg Chest 2 View 03/05/2015 Stable exam. No active cardiopulmonary disease.   Ct Head (brain) Wo Contrast 03/05/2015 Age related cerebral atrophy, ventriculomegaly and periventricular white matter disease. 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 No findings for acute hemispheric infarction or intracranial hemorrhage.   MRI HEAD 03/06/2015 1. Subtly increased 5 mm focus of high signal intensity within the posterior left frontal lobe on DWI sequence as above. Finding is favored to be artifactual in nature, although possible small acute ischemic infarct is not  entirely excluded. Correlation with physical exam and history for possible acute ischemia in this territory is recommended. 2. No other acute intracranial infarct or other abnormality identified. 3. Small remote anterior right frontal and posterior left frontal cortical infarcts as above. 4. Generalized cerebral atrophy.   MRA HEAD 03/06/2015 Unremarkable MRA of the intracranial circulation without proximal branch occlusion or hemodynamically significant stenosis.   CUS - Preliminary findings: Bilateral: 1-39% ICA stenosis. Vertebral artery flow is antegrade.   2D echo  03/06/2015 Study Conclusions - Left ventricle: The cavity size was normal. Wall thickness was normal. Systolic function was normal. The estimated ejection fraction was in the range of 55% to 60%. - Aortic valve: There was mild regurgitation. - Mitral valve: s/p MV annuloplasty. Echodenisity on atrial side of anterior mitrla leaflet probably reflects some prolapse, cannot completely exclude small mass There are several jets of MR, one posterior, one central, one anteiror. Pkea nad mean gradients through the valver are 9 and 3 mm respectively This is increased from gradients seen in 2014. There was moderate regurgitation. Valve area by continuity equation (using LVOT flow): 1.27 cm^2. - Left atrium: The atrium was severely dilated. - Right ventricle: The cavity size was moderately dilated. Wall thickness was normal. - Right atrium: The  atrium was mildly dilated. - Pericardium, extracardiac: A trivial pericardial effusion was identified.  TEE - - Left ventricle: The cavity size was normal. Wall thickness was normal. The estimated ejection fraction was 55%. Wall motion was normal; there were no regional wall motion abnormalities. - Aortic valve: There was no stenosis. There was trivial regurgitation. - Aorta: Normal caliber aorta with no significant plaque. - Mitral valve: The  patient is s/p mitral valve repair with annuloplasty ring. There does appear to be a loose suture posteriorly with a small area of annuloplasty ring dehiscence. There is moderate mitral regurgitation with one perivalvular jet posteriorly and a small central jet. There is no significant stenosis. No vegetation noted. - Left atrium: There was mild left atrial enlargement. The LA appendage has been oversewn with a very small residual connection to the LA. There was no thrombus in the appendage. - Right ventricle: The cavity size was mildly dilated. Systolic function was normal. - Right atrium: No evidence of thrombus in the atrial cavity or appendage. - Atrial septum: No defect or patent foramen ovale was identified. Echo contrast study showed no right-to-left atrial level shunt, at baseline or with provocation.  Loop recorder - Afib with RVR episodes  Component     Latest Ref Rng 03/07/2015  Cholesterol     0 - 200 mg/dL 629  Triglycerides     <150 mg/dL 94  HDL Cholesterol     >39 mg/dL 48  Total CHOL/HDL Ratio      3.3  VLDL     0 - 40 mg/dL 19  LDL (calc)     0 - 99 mg/dL 93  Hemoglobin B2W     4.8 - 5.6 % 6.0 (H)  Mean Plasma Glucose      126   Assessment: As you may recall, he is a 83 y.o. Caucasian male with PMH of Parkinson's disease followed with [facility], mitral valve replacement in 2008 and dyslipidemia was admitted on 03/05/15 for right facial droop and expressive aphasia, rapid improving. Stroke work up so far negative except MRI showed left frontal cortical small and subtle infarct. TEE negative and loop recorder placed. Found to have afib RVR on loop recorder, put on coumadin due to valvular afib. Also put on cardizem for rate control. He declined for RESPECT ESUS. He has been doing well during the interval time. INR at goal and stable. Continue to have low BP, but asymptomatic and negative orthostatic vitals. Need close watch, his medication such as  sinemet may have orthostatic hypotension side effect.  Plan:  - continue coumadin for stroke prevention and INR goal 2-3 - continue lipitor for stroke prevention - check BP at home if feels dizzy and BP low, lie flat.  - follow up with Dr. Delfina Redwood for PD. - follow up with cardiology as scheduled.  - Follow up with your primary care physician for stroke risk factor modification. Recommend maintain blood pressure goal <130/80, diabetes with hemoglobin A1c goal below 6.5% and lipids with LDL cholesterol goal below 70 mg/dL.  - follow up as needed.  No orders of the defined types were placed in this encounter.   No orders of the defined types were placed in this encounter.   Patient Instructions  - continue coumadin for stroke prevention and INR goal 2-3 - continue lipitor for stroke prevention - check BP at home if feels foggy again, if BP low, lie flat.  - follow up with Dr. Delfina Redwood for PD. - follow up with cardiology  as scheduled.  - Follow up with your primary care physician for stroke risk factor modification. Recommend maintain blood pressure goal <130/80, diabetes with hemoglobin A1c goal below 6.5% and lipids with LDL cholesterol goal below 70 mg/dL.  - follow up as needed.   Marvel Plan, MD PhD [facility] [address] [place], [place] [address] [phone]

## 2016-12-14 NOTE — Patient Instructions (Addendum)
-   continue coumadin for stroke prevention and INR goal 2-3 - continue lipitor for stroke prevention - check BP at home if feels foggy again, if BP low, lie flat.  - follow up with Dr. Delfina RedwoodSidiqqi for PD. - follow up with cardiology as scheduled.  - Follow up with your primary care physician for stroke risk factor modification. Recommend maintain blood pressure goal <130/80, diabetes with hemoglobin A1c goal below 6.5% and lipids with LDL cholesterol goal below 70 mg/dL.  - follow up as needed.

## 2016-12-21 ENCOUNTER — Ambulatory Visit (INDEPENDENT_AMBULATORY_CARE_PROVIDER_SITE_OTHER): Payer: Medicare Other | Admitting: *Deleted

## 2016-12-21 DIAGNOSIS — G458 Other transient cerebral ischemic attacks and related syndromes: Secondary | ICD-10-CM

## 2016-12-21 DIAGNOSIS — Z5181 Encounter for therapeutic drug level monitoring: Secondary | ICD-10-CM | POA: Diagnosis not present

## 2016-12-21 DIAGNOSIS — I63412 Cerebral infarction due to embolism of left middle cerebral artery: Secondary | ICD-10-CM

## 2016-12-21 DIAGNOSIS — Z9889 Other specified postprocedural states: Secondary | ICD-10-CM | POA: Diagnosis not present

## 2016-12-21 DIAGNOSIS — I480 Paroxysmal atrial fibrillation: Secondary | ICD-10-CM

## 2016-12-21 LAB — POCT INR: INR: 2.3

## 2016-12-23 LAB — CUP PACEART REMOTE DEVICE CHECK
Implantable Pulse Generator Implant Date: 20160421
MDC IDC SESS DTM: 20171212211625

## 2016-12-23 NOTE — Progress Notes (Signed)
Carelink summary report received. Battery status OK. Normal device function. No new symptom episodes, tachy episodes, brady, or pause episodes. No new AF episodes. Monthly summary reports and ROV/PRN 

## 2017-01-03 ENCOUNTER — Ambulatory Visit (INDEPENDENT_AMBULATORY_CARE_PROVIDER_SITE_OTHER): Payer: Medicare Other | Admitting: *Deleted

## 2017-01-03 DIAGNOSIS — I63412 Cerebral infarction due to embolism of left middle cerebral artery: Secondary | ICD-10-CM

## 2017-01-03 NOTE — Progress Notes (Signed)
Carelink Summary Report / Loop Recorder 

## 2017-01-12 LAB — CUP PACEART REMOTE DEVICE CHECK
Implantable Pulse Generator Implant Date: 20160421
MDC IDC SESS DTM: 20180111213746

## 2017-01-26 LAB — CUP PACEART REMOTE DEVICE CHECK
Date Time Interrogation Session: 20180210214238
MDC IDC PG IMPLANT DT: 20160421

## 2017-01-27 ENCOUNTER — Ambulatory Visit: Payer: Medicare Other

## 2017-01-27 ENCOUNTER — Ambulatory Visit: Payer: Medicare Other | Admitting: Occupational Therapy

## 2017-01-27 ENCOUNTER — Ambulatory Visit: Payer: Medicare Other | Admitting: Physical Therapy

## 2017-01-31 ENCOUNTER — Ambulatory Visit (INDEPENDENT_AMBULATORY_CARE_PROVIDER_SITE_OTHER): Payer: Medicare Other | Admitting: *Deleted

## 2017-01-31 DIAGNOSIS — I63412 Cerebral infarction due to embolism of left middle cerebral artery: Secondary | ICD-10-CM

## 2017-01-31 DIAGNOSIS — Z9889 Other specified postprocedural states: Secondary | ICD-10-CM | POA: Diagnosis not present

## 2017-01-31 DIAGNOSIS — I480 Paroxysmal atrial fibrillation: Secondary | ICD-10-CM

## 2017-01-31 DIAGNOSIS — Z5181 Encounter for therapeutic drug level monitoring: Secondary | ICD-10-CM | POA: Diagnosis not present

## 2017-01-31 DIAGNOSIS — G458 Other transient cerebral ischemic attacks and related syndromes: Secondary | ICD-10-CM | POA: Diagnosis not present

## 2017-01-31 LAB — POCT INR: INR: 1.8

## 2017-01-31 MED ORDER — WARFARIN SODIUM 5 MG PO TABS: ORAL_TABLET | ORAL | 3 refills | Status: DC

## 2017-02-01 NOTE — Progress Notes (Signed)
Carelink Summary Report / Loop Recorder 

## 2017-02-06 LAB — CUP PACEART REMOTE DEVICE CHECK
Implantable Pulse Generator Implant Date: 20160421
MDC IDC SESS DTM: 20180312220600

## 2017-02-06 NOTE — Progress Notes (Signed)
Carelink summary report received. Battery status OK. Normal device function. No new symptom episodes, tachy episodes, brady, or pause episodes. No new AF episodes. Monthly summary reports and ROV/PRN 

## 2017-03-01 ENCOUNTER — Encounter: Payer: Self-pay | Admitting: Internal Medicine

## 2017-03-02 ENCOUNTER — Telehealth: Payer: Self-pay | Admitting: *Deleted

## 2017-03-02 ENCOUNTER — Ambulatory Visit (INDEPENDENT_AMBULATORY_CARE_PROVIDER_SITE_OTHER): Payer: Medicare Other | Admitting: *Deleted

## 2017-03-02 ENCOUNTER — Encounter: Payer: Medicare Other | Admitting: Internal Medicine

## 2017-03-02 DIAGNOSIS — I63412 Cerebral infarction due to embolism of left middle cerebral artery: Secondary | ICD-10-CM

## 2017-03-02 NOTE — Telephone Encounter (Signed)
Pt's wife called & informed us that the pt is sick. She stated she was has been trying to call the main number and it stated the office was closed. Advised pt it was because the office had a staff member and apologized for the inconvience. Therefore, she stated she called [facility] & left the message because all the appts needed to be canceled. Advised that I will cancel the [facility] visit at the time & she stated she would call back when able to reschedule.  Also, I went to Dr. Lubertha Basque [facility] & spoke with Wynona Canes, LPN & Shearon Balo, CMA regarding pt being sick & calling to cancel appt & because husband is sick, therefore, they will have the scheduler reschedule his appt. Also, reminded pt to call [facility] back to reschedule & she stated she would. She thanked me for assisting her.   Also, the pt is taking Cefdinir  twice a day for 7 days. He started the med on yesterday. Advised that med is safe to take with Coumadin & to call back with any other new meds.

## 2017-03-03 ENCOUNTER — Other Ambulatory Visit: Payer: Self-pay | Admitting: Cardiology

## 2017-03-03 NOTE — Progress Notes (Signed)
Carelink Summary Report / Loop Recorder 

## 2017-03-04 ENCOUNTER — Other Ambulatory Visit: Payer: Self-pay

## 2017-03-08 ENCOUNTER — Encounter: Payer: Self-pay | Admitting: Internal Medicine

## 2017-03-08 ENCOUNTER — Ambulatory Visit (INDEPENDENT_AMBULATORY_CARE_PROVIDER_SITE_OTHER): Payer: Medicare Other | Admitting: *Deleted

## 2017-03-08 ENCOUNTER — Ambulatory Visit (INDEPENDENT_AMBULATORY_CARE_PROVIDER_SITE_OTHER): Payer: Medicare Other | Admitting: Internal Medicine

## 2017-03-08 ENCOUNTER — Other Ambulatory Visit: Payer: Self-pay

## 2017-03-08 VITALS — BP 96/68 | HR 73 | Ht 70.0 in | Wt 178.8 lb

## 2017-03-08 DIAGNOSIS — I480 Paroxysmal atrial fibrillation: Secondary | ICD-10-CM

## 2017-03-08 DIAGNOSIS — I639 Cerebral infarction, unspecified: Secondary | ICD-10-CM

## 2017-03-08 DIAGNOSIS — Z5181 Encounter for therapeutic drug level monitoring: Secondary | ICD-10-CM | POA: Diagnosis not present

## 2017-03-08 DIAGNOSIS — I340 Nonrheumatic mitral (valve) insufficiency: Secondary | ICD-10-CM | POA: Diagnosis not present

## 2017-03-08 DIAGNOSIS — Z9889 Other specified postprocedural states: Secondary | ICD-10-CM

## 2017-03-08 DIAGNOSIS — G458 Other transient cerebral ischemic attacks and related syndromes: Secondary | ICD-10-CM | POA: Diagnosis not present

## 2017-03-08 DIAGNOSIS — I63412 Cerebral infarction due to embolism of left middle cerebral artery: Secondary | ICD-10-CM

## 2017-03-08 LAB — CUP PACEART INCLINIC DEVICE CHECK
Date Time Interrogation Session: 20180417113651
MDC IDC PG IMPLANT DT: 20160421

## 2017-03-08 LAB — POCT INR: INR: 2.6

## 2017-03-08 NOTE — Progress Notes (Signed)
HPI [name] [name] returns today for followup. He had a cryptogenic stroke and was found after ILR insertion to have atrial fibrillation. He is doing well and denies chest pain, sob, or edema. No syncope and no palpitations. He notes some dizziness. In the last year no atrial fib has been seen. He has about a year on his ILR battery.  Allergies  Allergen Reactions  . Ace Inhibitors Cough  . Demerol [Meperidine] Other (See Comments)    Parkinsons disease  . Poison Ivy Extract     Unknown reaction     Current Outpatient Prescriptions  Medication Sig Dispense Refill  . amantadine (SYMMETREL) 100 MG capsule Take 100 mg by mouth 2 (two) times daily.    Marland Kitchen atorvastatin (LIPITOR) 20 MG tablet Take 20 mg by mouth daily at 6 PM.     . cholecalciferol (VITAMIN D) 1000 UNITS tablet Take 1,000 Units by mouth daily.    Marland Kitchen diltiazem (CARDIZEM CD) 120 MG 24 hr capsule Take 120 mg by mouth daily.    Tery Sanfilippo Calcium (STOOL SOFTENER PO) Take 1 capsule by mouth daily as needed (constipation).     Marland Kitchen donepezil (ARICEPT) 10 MG tablet Take 1 tablet by mouth at bedtime.    Marland Kitchen escitalopram (LEXAPRO) 10 MG tablet Take 10 mg by mouth daily.    Marland Kitchen ibandronate (BONIVA) 150 MG tablet Take 1 tablet by mouth every 30 (thirty) days.    . Multiple Vitamins-Minerals (MULTIVITAMIN PO) Take 1 tablet by mouth daily.     . rasagiline (AZILECT) 1 MG TABS tablet Take 1 mg by mouth every morning. .    . rotigotine (NEUPRO) 4 MG/24HR Place 1 patch onto the skin daily.    Marland Kitchen RYTARY 48.75-195 MG CPCR Take 9 capsules by mouth See admin instructions. 3 caps in the morning with Azelect, 3 caps at 1pm with Amantadine, 2 caps at 6pm with Amantadine and 1 cap at bedtime.    . vitamin B-12 (CYANOCOBALAMIN) 1000 MCG tablet Take 1,000 mcg by mouth as directed.     . vitamin E 400 UNIT capsule Take 400 Units by mouth daily.    Marland Kitchen warfarin (COUMADIN) 5 MG tablet TAKE AS DIRECTED. 30 tablet 3   No current facility-administered medications  for this visit.      Past Medical History:  Diagnosis Date  . Dyslipidemia   . Parkinson's disease (HCC)   . S/P mitral valve replacement    mitral regurg, endocarditis  . Stroke (HCC)     ROS:   All systems reviewed and negative except as noted in the HPI.   Past Surgical History:  Procedure Laterality Date  . CATARACT EXTRACTION  2009, 2012   right 2009, left 2012  . LOOP RECORDER IMPLANT Left 03/13/2015   Procedure: LOOP RECORDER IMPLANT;  Surgeon: Marinus Maw, MD;  Location: [facility] CATH LAB;  Service: Cardiovascular;  Laterality: Left;  . 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 TEE WITHOUT CARDIOVERSION N/A 03/13/2015   Procedure: TRANSESOPHAGEAL ECHOCARDIOGRAM (TEE);  Surgeon: Laurey Morale, MD;  Location: [facility] ENDOSCOPY;  Service: Cardiovascular;  Laterality: N/A;  . TRANSTHORACIC ECHOCARDIOGRAM  08/15/2012   EF=>55%; normal LV systolic function; RV mildly dilated & systolic function mildly reduced; RA mod dilated; mild -mod MR & mildy increased gradients; mild TR; AV mildly sclerotic with mild-mod regurg     Family History  Problem Relation Age of Onset  . Stroke Mother     cerebral hemorrahge  . Heart attack Mother   . Prostate cancer Father   . Heart attack Father   . Hypertension Brother   . Valvular heart disease Brother      Social History   Social History  . Marital status: Married    Spouse name: N/A  . Number of children: 3  . Years of education: law school   Occupational History  . retired    Social History Main Topics  . Smoking status: Never Smoker  . Smokeless tobacco: Never Used  . Alcohol use 1.2 oz/week    1 Glasses of wine, 1 Cans of beer per week     Comment: 1 can of beer or 1 wine every day  . Drug use: No  . Sexual activity: Not on file   Other Topics Concern  . Not on file   Social History Narrative  . No narrative on file     BP  96/68   Pulse 73   Ht  (1.778 m)   Wt 178 lb 12.8 oz (81.1 kg)   SpO2 98%   BMI 25.66 kg/m   Physical Exam:  Well appearing 83 yo man, NAD HEENT: Unremarkable Neck:  6 cm JVD, no thyromegally Lymphatics:  No adenopathy Back:  No CVA tenderness Lungs:  Clear with no wheezes HEART:  Regular rate rhythm, 2/6 blowing systolic murmur, no rubs, no clicks Abd:  soft, positive bowel sounds, no organomegally, no rebound, no guarding Ext:  2 plus pulses, no edema, no cyanosis, no clubbing Skin:  No rashes no nodules Neuro:  CN II through XII intact, motor grossly intact   DEVICE  ILR is working normally with minimal atrial fib and no pauses   Assess/Plan: 1. PAF - he is maintaining NSR over 99.9% of the time. Will follow. He will continue warfarin 2. ILR - his device is working normally. Will follow. 3. HTN - his blood pressure is low and he has had some dizziness. I have asked him to hold off on his cardizem though if his pressure goes up he is encouraged to restart the cardizem. 4. Mitral regurge - he has more murmur on exam but appears to be asymptomatic. Will follow.  Leonia Reeves.D.

## 2017-03-08 NOTE — Patient Instructions (Signed)
Your physician has recommended you make the following change in your medication:  1.) stop Cardizem---resume medication if your blood pressure is greater than 120 (top number)  Your physician wants you to follow-up in: 12 months with Dr. Ladona Ridgel.  You will receive a reminder letter in the mail two months in advance. If you don't receive a letter, please call our office to schedule the follow-up appointment.

## 2017-03-11 LAB — CUP PACEART REMOTE DEVICE CHECK
Date Time Interrogation Session: 20180411224050
Implantable Pulse Generator Implant Date: 20160421

## 2017-04-01 ENCOUNTER — Ambulatory Visit (INDEPENDENT_AMBULATORY_CARE_PROVIDER_SITE_OTHER): Payer: Medicare Other | Admitting: *Deleted

## 2017-04-01 DIAGNOSIS — I63412 Cerebral infarction due to embolism of left middle cerebral artery: Secondary | ICD-10-CM | POA: Diagnosis not present

## 2017-04-04 NOTE — Progress Notes (Signed)
Carelink Summary Report / Loop Recorder 

## 2017-04-06 ENCOUNTER — Ambulatory Visit (INDEPENDENT_AMBULATORY_CARE_PROVIDER_SITE_OTHER): Payer: Medicare Other | Admitting: *Deleted

## 2017-04-06 DIAGNOSIS — I480 Paroxysmal atrial fibrillation: Secondary | ICD-10-CM | POA: Diagnosis not present

## 2017-04-06 DIAGNOSIS — Z9889 Other specified postprocedural states: Secondary | ICD-10-CM

## 2017-04-06 DIAGNOSIS — Z5181 Encounter for therapeutic drug level monitoring: Secondary | ICD-10-CM | POA: Diagnosis not present

## 2017-04-06 DIAGNOSIS — I63412 Cerebral infarction due to embolism of left middle cerebral artery: Secondary | ICD-10-CM | POA: Diagnosis not present

## 2017-04-06 DIAGNOSIS — G458 Other transient cerebral ischemic attacks and related syndromes: Secondary | ICD-10-CM

## 2017-04-06 LAB — POCT INR: INR: 2.7

## 2017-04-07 LAB — CUP PACEART REMOTE DEVICE CHECK
Date Time Interrogation Session: 20180511233918
MDC IDC PG IMPLANT DT: 20160421

## 2017-05-02 ENCOUNTER — Ambulatory Visit (INDEPENDENT_AMBULATORY_CARE_PROVIDER_SITE_OTHER): Payer: Medicare Other | Admitting: *Deleted

## 2017-05-02 DIAGNOSIS — I63412 Cerebral infarction due to embolism of left middle cerebral artery: Secondary | ICD-10-CM | POA: Diagnosis not present

## 2017-05-02 NOTE — Progress Notes (Signed)
Carelink Summary Report / Loop Recorder 

## 2017-05-04 ENCOUNTER — Ambulatory Visit (INDEPENDENT_AMBULATORY_CARE_PROVIDER_SITE_OTHER): Payer: Medicare Other | Admitting: *Deleted

## 2017-05-04 DIAGNOSIS — Z9889 Other specified postprocedural states: Secondary | ICD-10-CM

## 2017-05-04 DIAGNOSIS — G458 Other transient cerebral ischemic attacks and related syndromes: Secondary | ICD-10-CM

## 2017-05-04 DIAGNOSIS — Z5181 Encounter for therapeutic drug level monitoring: Secondary | ICD-10-CM | POA: Diagnosis not present

## 2017-05-04 DIAGNOSIS — I480 Paroxysmal atrial fibrillation: Secondary | ICD-10-CM

## 2017-05-04 DIAGNOSIS — I63412 Cerebral infarction due to embolism of left middle cerebral artery: Secondary | ICD-10-CM | POA: Diagnosis not present

## 2017-05-04 LAB — POCT INR: INR: 2.1

## 2017-05-05 ENCOUNTER — Ambulatory Visit: Payer: Medicare Other | Attending: Internal Medicine | Admitting: Physical Therapy

## 2017-05-05 ENCOUNTER — Ambulatory Visit: Payer: Medicare Other

## 2017-05-05 ENCOUNTER — Ambulatory Visit: Payer: Medicare Other | Admitting: Occupational Therapy

## 2017-05-05 DIAGNOSIS — R471 Dysarthria and anarthria: Secondary | ICD-10-CM

## 2017-05-05 DIAGNOSIS — R2689 Other abnormalities of gait and mobility: Secondary | ICD-10-CM | POA: Insufficient documentation

## 2017-05-05 DIAGNOSIS — R278 Other lack of coordination: Secondary | ICD-10-CM | POA: Insufficient documentation

## 2017-05-05 DIAGNOSIS — R4701 Aphasia: Secondary | ICD-10-CM

## 2017-05-05 NOTE — Therapy (Signed)
[facility] [address] [place], [place], [address] Phone: [phone]   Fax:  [phone]  Patient Details  Name: [name] HensenJohn [name] MRN: [id number] Date of Birth: 10/01/1932 Referring Provider:  Rodrigo RanPerini, Mark, MD  Encounter Date: 05/05/2017  Occupational Therapy Parkinson's Disease Screen  Hand dominance:  RUE   Physical Performance Test item #2 (simulated eating):  16.81 sec  Physical Performance Test item #4 (donning/doffing jacket):  15.38 sec    9-hole peg test:    RUE  41.97  sec        LUE  29.19 sec  Box & Blocks Test:   RUE  37 blocks        LUE  47 blocks   Change in ability to perform ADLs/IADLs:  Change in fine motor coordination for ADLs/ significant diyskinesias   Pt would benefit from occupational therapy evaluation due to  Decline in coordination for ADLS, significant dyskinesias. Geneal Huebert 05/05/2017, 11:51 AM  [facility] [address] [place], [place], [address] Phone: [phone]   Fax:  [phone]

## 2017-05-05 NOTE — Therapy (Signed)
[facility] [address] [place], [place], [address] Phone: [phone]   Fax:  [phone]  Patient Details  Name: [name] [name] MRN: [id number] Date of Birth: 09/09/1932 Referring Provider:  Jaquita FoldsSiddiqui, Mustafa, MD  Encounter Date: 05/05/2017  Speech Therapy Parkinson's Disease Screen   Decibel Level today: 68dB  (WNL=70-72 dB) with sound level meter 30cm away from pt's mouth. Pt's conversational volume is comparable to last treatment course in late 2016-early 2017 (3 ST visits) in which the patient desired early d/c from ST due to non-clinical reasons.  Pt does not report difficulty in swallowing warranting objective evaluation, however reports incr'd difficulty in spelling words and word finding. Aphasia was a problem area for pt in 2016 as well.  Pt would benefit from speech-language eval for dysarthria and for aphasia- please order via EPIC or call [phone] to schedule    [facility] Center[facility]INKE,Malai Lady ,MS, CCC-SLP  05/05/2017, 8:43 AM  [facility] [address] [place], [place], [address] Phone: [phone]   Fax:  [phone]

## 2017-05-05 NOTE — Therapy (Signed)
[facility] [address] [place], [place], [address] Phone: [phone]   Fax:  [phone]  Patient Details  Name: [name] HensenJohn [name] MRN: [id number] Date of Birth: 03/20/1932 Referring Provider:  Rodrigo RanPerini, Mark, MD  Encounter Date: 05/05/2017   Physical Therapy Parkinson's Disease Screen   Timed Up and Go test:10.06  10 meter walk test:10.56 sec = 3.11 ft/sec  5 time sit to stand test:11.22 sec    Patient would likely benefit from PT eval; however, pt does not want to pursue Physical Therapy services at this time.  Recommend Physical Therapy screen in 3-4 months.  Pt noted to have 2 stumbles in therapy screen today-one while turning around and one while quickly stopping.  Wife reports more stumble steps recently.  Wife reports going through some medication adjustments, and does not want to pursue PT right now.  Advised to speak with physician and get order if any significant changes before next screen.   Trevaun Rendleman W. 05/05/2017, 8:22 AM  Gean MaidensMARRIOTT,Nicolle Heward W., PT [facility] [address] [place], [place], [address] Phone: [phone]   Fax:  [phone]

## 2017-05-10 LAB — CUP PACEART REMOTE DEVICE CHECK
MDC IDC PG IMPLANT DT: 20160421
MDC IDC SESS DTM: 20180610233738

## 2017-05-31 ENCOUNTER — Ambulatory Visit (INDEPENDENT_AMBULATORY_CARE_PROVIDER_SITE_OTHER): Payer: Medicare Other | Admitting: *Deleted

## 2017-05-31 DIAGNOSIS — I63412 Cerebral infarction due to embolism of left middle cerebral artery: Secondary | ICD-10-CM

## 2017-06-01 NOTE — Progress Notes (Signed)
Carelink Summary Report / Loop Recorder 

## 2017-06-10 LAB — CUP PACEART REMOTE DEVICE CHECK
Implantable Pulse Generator Implant Date: 20160421
MDC IDC SESS DTM: 20180710234304

## 2017-06-14 ENCOUNTER — Ambulatory Visit (INDEPENDENT_AMBULATORY_CARE_PROVIDER_SITE_OTHER): Payer: Medicare Other | Admitting: *Deleted

## 2017-06-14 DIAGNOSIS — G458 Other transient cerebral ischemic attacks and related syndromes: Secondary | ICD-10-CM

## 2017-06-14 DIAGNOSIS — Z9889 Other specified postprocedural states: Secondary | ICD-10-CM

## 2017-06-14 DIAGNOSIS — I480 Paroxysmal atrial fibrillation: Secondary | ICD-10-CM

## 2017-06-14 DIAGNOSIS — Z5181 Encounter for therapeutic drug level monitoring: Secondary | ICD-10-CM | POA: Diagnosis not present

## 2017-06-14 DIAGNOSIS — I63412 Cerebral infarction due to embolism of left middle cerebral artery: Secondary | ICD-10-CM | POA: Diagnosis not present

## 2017-06-14 LAB — POCT INR: INR: 2.2

## 2017-06-28 ENCOUNTER — Ambulatory Visit: Payer: Medicare Other | Admitting: Occupational Therapy

## 2017-06-28 ENCOUNTER — Ambulatory Visit: Payer: Medicare Other | Attending: Internal Medicine

## 2017-06-28 DIAGNOSIS — R29818 Other symptoms and signs involving the nervous system: Secondary | ICD-10-CM | POA: Diagnosis present

## 2017-06-28 DIAGNOSIS — R41842 Visuospatial deficit: Secondary | ICD-10-CM

## 2017-06-28 DIAGNOSIS — R4701 Aphasia: Secondary | ICD-10-CM | POA: Insufficient documentation

## 2017-06-28 DIAGNOSIS — R2689 Other abnormalities of gait and mobility: Secondary | ICD-10-CM | POA: Diagnosis present

## 2017-06-28 DIAGNOSIS — R471 Dysarthria and anarthria: Secondary | ICD-10-CM | POA: Diagnosis not present

## 2017-06-28 DIAGNOSIS — R29898 Other symptoms and signs involving the musculoskeletal system: Secondary | ICD-10-CM | POA: Insufficient documentation

## 2017-06-28 DIAGNOSIS — R2681 Unsteadiness on feet: Secondary | ICD-10-CM | POA: Insufficient documentation

## 2017-06-28 DIAGNOSIS — R278 Other lack of coordination: Secondary | ICD-10-CM | POA: Insufficient documentation

## 2017-06-28 DIAGNOSIS — R293 Abnormal posture: Secondary | ICD-10-CM | POA: Diagnosis present

## 2017-06-28 DIAGNOSIS — R41840 Attention and concentration deficit: Secondary | ICD-10-CM | POA: Diagnosis present

## 2017-06-28 NOTE — Therapy (Signed)
[facility] [address] [place], [place], [address] Phone: [phone]   Fax:  [phone]  Occupational Therapy Evaluation  Patient Details  Name: [name] [name] MRN: [id number] Date of Birth: May 31, 1941 Referring Provider: Dr.Siddiqui  Encounter Date: 06/28/2017 (age 83)      OT End of Session - 06/28/17 0906    Visit Number 1   Number of Visits 17   Date for OT Re-Evaluation 08/27/17   Authorization Type [employer] MCR   Authorization - Visit Number 1   Authorization - Number of Visits 10   OT Start Time 0805   OT Stop Time 0845   OT Time Calculation (min) 40 min   Activity Tolerance Patient tolerated treatment well   Behavior During Therapy [facility] for tasks assessed/performed      Past Medical History:  Diagnosis Date  . Dyslipidemia   . Parkinson's disease (HCC)   . S/P mitral valve replacement    mitral regurg, endocarditis  . Stroke [facility])     Past Surgical History:  Procedure Laterality Date  . CATARACT EXTRACTION  2009, 2012   right 2009, left 2012  . LOOP RECORDER IMPLANT Left 03/13/2015   Procedure: LOOP RECORDER IMPLANT;  Surgeon: Marinus Maw, MD;  Location: [facility] CATH LAB;  Service: Cardiovascular;  Laterality: Left;  . 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 TEE WITHOUT CARDIOVERSION N/A 03/13/2015   Procedure: TRANSESOPHAGEAL ECHOCARDIOGRAM (TEE);  Surgeon: Laurey Morale, MD;  Location: [facility] ENDOSCOPY;  Service: Cardiovascular;  Laterality: N/A;  . TRANSTHORACIC ECHOCARDIOGRAM  08/15/2012   EF=>55%; normal LV systolic function; RV mildly dilated & systolic function mildly reduced; RA mod dilated; mild -mod MR & mildy increased gradients; mild TR; AV mildly sclerotic with mild-mod regurg    There were no vitals filed for this visit.      Subjective Assessment - 06/28/17 0902    Subjective  Pt with Parkinson's disease presents  to occupational therapy with fine motor coordination deficits which impede pt's ability to perform ADLs/IADLs   Pertinent History PD, hx of CVA mitral valve replacement, dyslipidemia, a-fib   Patient Stated Goals maintain independence   Currently in Pain? No/denies           [facility] OT Assessment - 06/28/17 0808      Assessment   Diagnosis Parkinson's disease   Referring Provider Dr.Siddiqui   Onset Date 05/30/17   Prior Therapy OT     Precautions   Precautions Fall     Balance Screen   Has the patient fallen in the past 6 months No   Has the patient had a decrease in activity level because of a fear of falling?  No   Is the patient reluctant to leave their home because of a fear of falling?  No     Home  Environment   Family/patient expects to be discharged to: Private residence   Living Arrangements Spouse/significant other   Home Layout Able to live on main level with bedroom/bathroom   Lives With Spouse     Prior Function   Level of Independence Independent   Leisure travel, yardwork, fixing things     ADL   Eating/Feeding Modified independent  increased spills   Grooming Modified independent   Upper Body Bathing Modified independent  walkin shower   Lower Body Bathing Modified independent   Upper Body Dressing Minimal assistance;Needs assist for  fasteners  has buttonhook, zippers   Lower Body Dressing Increased time;Modified independent   Toilet Transfer Modified independent  has grab bar   Tub/Shower Transfer Modified independent     IADL   Shopping Needs to be accompanied on any shopping trip   Light Housekeeping --  assists with vacuuming   Meal Prep Able to complete simple cold meal and snack prep  makes cereal   Medication Management Has difficulty remembering to take medication  Pt's wife assists with meds   Financial Management Dependent     Mobility   Mobility Status Independent     Written Expression   Dominant Hand Right   Handwriting 100%  legible  Micrographia if writing several sentences per pt report     Vision - History   Patient Visual Report --  blurry vision with fatigue, diplopia with fatigue     Vision Assessment   Vision Assessment Vision impaired  _ to be further tested in functional context     Cognition   Overall Cognitive Status Impaired/Different from baseline   Memory Impaired   Memory Impairment Decreased short term memory   Behaviors --  delayed processing when lots of info provided      Observation/Other Assessments   Other Surveys  Select   Simulated Eating Time (seconds) 13.91 secs   Donning Doffing Jacket Time (seconds) 32.55 secs  fastened 3buttons in 2 mins 55 sec     Coordination   9 Hole Peg Test Right;Left   Right 9 Hole Peg Test 43.74 secs   Left 9 Hole Peg Test 31.74 secs   Box and Blocks RUE 41 blocks, LUE 41 blocks     Tone   Assessment Location Right Upper Extremity     ROM / Strength   AROM / PROM / Strength AROM     AROM   Overall AROM  Deficits   Overall AROM Comments WFLS except difficulty reaching low back for dressing     RUE Tone   RUE Tone Mild  rigidity                           OT Short Term Goals - 06/28/17 0919      OT SHORT TERM GOAL #1   Title I with PD specific HEP   Time 4   Period Weeks   Status New     OT SHORT TERM GOAL #2   Title Pt will demonstrate improved fine motor coordination for ADLs as evidenced by decreasing RUE  9 hole peg test score to 40 secs or less.   Baseline RUE 43.74, LUE 31.73   Time 4   Period Weeks   Status New     OT SHORT TERM GOAL #3   Title Pt will demonstrate increased ease with dressing as evidenced by decreasing PPT#4 to 29 secs or less.   Baseline 32.55 secs   Time 4   Period Weeks   Status New           OT Long Term Goals - 06/28/17 1610      OT LONG TERM GOAL #1   Title Pt will verbalize understanding of strategies/AE to assist with ADLs/IADLs prn.   Time 8   Period Weeks    Status New     OT LONG TERM GOAL #2   Title Pt / wife will verbalize understanding of compensatroy strategies for short term memory deficits and ways to keep thinking skills  sharp.   Time 8   Period Weeks   Status New     OT LONG TERM GOAL #3   Title Pt will report increased eased with reaching behind back to tuck in shirt, and wash back.   Time 8   Period Weeks   Status New     OT LONG TERM GOAL #4   Title Pt will verbalize understanding of ways to prevent future PD related complications and verbalize understanding of community resources.   Time 8   Period Weeks   Status New     OT LONG TERM GOAL #5   Title --------------------------------------               Plan - July 06, 2017 1233    Clinical Impression Statement Pt is a 83 y.o male with diagnosis of Parkinson's disease who presents with decreased coordination, cognitive deficits, decreased balance, rigidity, bradykinesia, visuospatial deficits, rigidity, and abnormal posture which impedes performance of daily activities. Pt can benefit from skilled occupational therapy to maximize pt safety and independence with ADLs/ IADLS and to maintain quality of life.   Occupational Profile and client history currently impacting functional performance PMH: PD,  CVA, mitral valve replacement, dyslipedemia, a-fib, Pt's deficits impede his ability to perfrom leisure activities including travel and yardwork.   Occupational performance deficits (Please refer to evaluation for details): ADL's;IADL's;Play;Leisure;Social Participation   Rehab Potential Good   Current Impairments/barriers affecting progress: cognitive deficits   OT Frequency 2x / week  plus eval, may be able to d/c in 4-6 weeks   OT Duration 8 weeks   OT Treatment/Interventions Self-care/ADL training;Moist Heat;Fluidtherapy;DME and/or AE instruction;Splinting;Patient/family education;Balance training;Therapeutic exercises;Ultrasound;Therapeutic exercise;Therapeutic  activities;Cognitive remediation/compensation;Passive range of motion;Functional Mobility Training;Neuromuscular education;Energy conservation;Manual Therapy;Visual/perceptual remediation/compensation   Plan initiate coordination HEP   Clinical Decision Making Limited treatment options, no task modification necessary   Consulted and Agree with Plan of Care Patient;Family member/caregiver   Family Member Consulted wife      Patient will benefit from skilled therapeutic intervention in order to improve the following deficits and impairments:  Abnormal gait, Decreased coordination, Decreased range of motion, Difficulty walking, Impaired flexibility, Decreased safety awareness, Decreased endurance, Decreased activity tolerance, Decreased knowledge of precautions, Impaired tone, Impaired UE functional use, Pain, Decreased balance, Decreased cognition, Decreased mobility, Decreased strength, Impaired vision/preception  Visit Diagnosis: Other lack of coordination - Plan: Ot plan of care cert/re-cert  Other abnormalities of gait and mobility - Plan: Ot plan of care cert/re-cert  Other symptoms and signs involving the nervous system - Plan: Ot plan of care cert/re-cert  Other symptoms and signs involving the musculoskeletal system - Plan: Ot plan of care cert/re-cert  Attention and concentration deficit - Plan: Ot plan of care cert/re-cert  Abnormal posture - Plan: Ot plan of care cert/re-cert  Visuospatial deficit - Plan: Ot plan of care cert/re-cert      G-Codes - July 06, 2017 1247    Functional Assessment Tool Used (Outpatient only) PPT# 4 (donning/ doffing jacket) 32.55, 9 hole peg test RUE 43.74 LUE 31.73 secs   Functional Limitation Self care   Self Care Current Status (Z6109) At least 20 percent but less than 40 percent impaired, limited or restricted   Self Care Goal Status (U0454) At least 1 percent but less than 20 percent impaired, limited or restricted      Problem List Patient  Active Problem List   Diagnosis Date Noted  . Encounter for therapeutic drug monitoring 01/05/2016  . Paroxysmal atrial fibrillation (HCC) 12/24/2015  .  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 HLD (hyperlipidemia) 06/02/2015  . PD (Parkinson's disease) (HCC) 06/02/2015  . Mitral regurgitation 03/17/2015  . CVA (cerebral infarction)   . TIA (transient ischemic attack) 03/05/2015  . Slurred speech 03/05/2015  . Anxiety 12/19/2013  . Parkinson's disease (HCC) 08/06/2013  . Dyslipidemia 08/06/2013  . S/P mitral valve repair 08/06/2013    Bradie Lacock 06/28/2017, 12:53 PM Keene BreathKathryn Zalyn Amend, OTR/L [phone] Phone: [phone] 12:53 PM 06/28/17 [facility] [address] [place], [place], [address] Phone: [phone]   Fax:  [phone]  Name: Cherly HensenJohn Laster MRN: [id number] Date of Birth: 12/21/1931

## 2017-06-28 NOTE — Therapy (Signed)
[facility] [address] [place], [place], [address] Phone: [phone]   Fax:  [phone]  Speech Language Pathology Evaluation  Patient Details  Name: [name] HensenJohn [name] MRN: [id number] Date of Birth: 03/04/1932 Referring Provider: Jaquita FoldsSiddiqui, Mustafa, MD  Encounter Date: 06/28/2017      End of Session - 06/29/17 0029    Visit Number 1   Number of Visits 17   Date for SLP Re-Evaluation 09/16/17   SLP Start Time 0851   SLP Stop Time  0931   SLP Time Calculation (min) 40 min   Activity Tolerance Patient tolerated treatment well      Past Medical History:  Diagnosis Date  . Dyslipidemia   . Parkinson's disease (HCC)   . S/P mitral valve replacement    mitral regurg, endocarditis  . Stroke [facility](HCC)     Past Surgical History:  Procedure Laterality Date  . CATARACT EXTRACTION  2009, 2012   right 2009, left 2012  . LOOP RECORDER IMPLANT Left 03/13/2015   Procedure: LOOP RECORDER IMPLANT;  Surgeon: Marinus MawGregg W Taylor, MD;  [facility];  Service: Cardiovascular;  Laterality: Left;  . 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 TEE WITHOUT CARDIOVERSION N/A 03/13/2015   Procedure: TRANSESOPHAGEAL ECHOCARDIOGRAM (TEE);  Surgeon: Laurey Moralealton S McLean, MD;  [facility];  Service: Cardiovascular;  Laterality: N/A;  . TRANSTHORACIC ECHOCARDIOGRAM  08/15/2012   EF=>55%; normal LV systolic function; RV mildly dilated & systolic function mildly reduced; RA mod dilated; mild -mod MR & mildy increased gradients; mild TR; AV mildly sclerotic with mild-mod regurg    There were no vitals filed for this visit.      Subjective Assessment - 06/28/17 0902    Subjective "I remember the yelling." (what pt recalles from last course of ST)   Currently in Pain? No/denies            SLP Evaluation [facility] - 06/29/17 0001      SLP Visit Information    SLP Received On 06/28/17   Referring Provider Jaquita FoldsSiddiqui, Mustafa, MD   Onset Date Diagnosed November 2004   Medical Diagnosis Parkinson's disease     General Information   HPI Pt known from previous course of ST, ceased abruptly due to non-clinical reasons. Pt with complaints of decr'd loudness and more difficulty with word finding than during last course of ST.      Prior Functional Status   Cognitive/Linguistic Baseline Within functional limits    Lives With Spouse   Vocation Retired     IT consultantCognition   Overall Cognitive Status Within Functional Limits for tasks assessed     Auditory Comprehension   Overall Auditory Comprehension Appears within functional limits for tasks assessed     Verbal Expression   Overall Verbal Expression Impaired at baseline   Effective Techniques --  slowing rate, decr'ing external distractions (per wife, pt)   Other Verbal Expression Comments Pt had mild and rare word finding issues in min-mod complex conversation of 6 minutes.      Oral Motor/Sensory Function   Overall Oral Motor/Sensory Function Impaired   Labial ROM Within Functional Limits   Labial Symmetry Within Functional Limits   Labial Coordination Reduced   Lingual ROM Reduced right;Reduced left   Lingual Symmetry Within Functional Limits   Lingual Coordination Reduced   Velum Within Functional Limits     Motor Speech  Respiration Impaired   Level of Impairment Word   Phonation Low vocal intensity   Resonance Within functional limits   Intelligibility Intelligible   Volume Soft  average 10 minutes simple conversation upper 60s dB      Measured when a sound level meter was placed 30 cm away from pt's mouth, 4 minutes of conversational speech was reduced today, at average 68dB (WNL= average 70-72dB) with range of 62-70dB. Overall speech intelligibility for this listener in a quiet environment was not affected, at approx 100%. Production of loud /a/ averaged 95dB (range of 88 to 96dB)  and usual min cues needed for loudness.   In simple conversation task of 7 minutes pt was asked to use the same amount of effort as with loud /a/. Loudness average with this increased effort was average 72dB (range of 67 to 75) with rare min A for loudness. Pt would benefit from skilled ST in order to improve speech intelligibility and pt's QOL.                     SLP Education - 06/29/17 0028    Education provided Yes   Education Details ST therapy course and POC, loud "ah", needs to feel like he's shouting   Person(s) Educated Patient;Spouse   Methods Explanation   Comprehension Verbalized understanding          SLP Short Term Goals - 06/29/17 1610      SLP SHORT TERM GOAL #1   Title pt will maintain loud /a/ average 95dB x 4 sessions   Time 4   Period Weeks   Status New     SLP SHORT TERM GOAL #2   Title pt will demo 18/20 sentences with average low 70s dB x2 sessions   Time 4   Period Weeks   Status New     SLP SHORT TERM GOAL #3   Title pt will demo abdominal breatihng at rest 70% of the time over 3 minutes   Time 4   Period Weeks   Status New     SLP SHORT TERM GOAL #4   Title pt will complete simple naming tasks with 85% success over 3 sessions   Time 4   Period Weeks   Status New          SLP Long Term Goals - 06/29/17 9604      SLP LONG TERM GOAL #1   Title pt will demo loudness average low 70s dB in 10 minutes simple-mod complex conversation with rare nonverbal cues x3 sessions   Time 8   Period Weeks   Status New     SLP LONG TERM GOAL #2   Title pt will demo abdominal breathing for 50% of a simple 8 minute conversation x 2 sessions   Time 8   Period Weeks   Status New     SLP LONG TERM GOAL #3   Title pt will tell SLP 3 s/s of oropharyngeal dysphagia with modified independence   Time 8   Period Weeks   Status New     SLP LONG TERM GOAL #4   Title pt will exhibit WFL word finding using compensations, in 10 minutes  simple-mod complex conversation   Time 8   Period Weeks   Status New          Plan - 06/29/17 5409    Clinical Impression Statement Pt presents today with reduced speech volume in conversation (average 67dB) however pt's performance improved  after loud /a/. Pt would benefit from skilled ST targeting habitualizing louder, WNL speech loudness as well as improved word finding and compenstaions for word finding in conversation.   Speech Therapy Frequency 2x / week   Duration --  8 weeks   Treatment/Interventions Internal/external aids;SLP instruction and feedback;Language facilitation;Compensatory techniques;Environmental controls;Multimodal communcation approach;Cueing hierarchy;Functional tasks;Patient/family education   Potential to Achieve Goals Good   Potential Considerations Severity of impairments      Patient will benefit from skilled therapeutic intervention in order to improve the following deficits and impairments:   Dysarthria and anarthria  Aphasia      G-Codes - 07/10/2017 0826    Functional Assessment Tool Used noms   Functional Limitations Motor speech   Motor Speech Current Status [phone]) At least 20 percent but less than 40 percent impaired, limited or restricted   Motor Speech Goal Status (Z3664) At least 1 percent but less than 20 percent impaired, limited or restricted      Problem List Patient Active Problem List   Diagnosis Date Noted  . Encounter for therapeutic drug monitoring 01/05/2016  . Paroxysmal atrial fibrillation (HCC) 12/24/2015  . 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 HLD (hyperlipidemia) 06/02/2015  . PD (Parkinson's disease) (HCC) 06/02/2015  . Mitral regurgitation 03/17/2015  . CVA (cerebral infarction)   . TIA (transient ischemic attack) 03/05/2015  . Slurred speech 03/05/2015  . Anxiety 12/19/2013  . Parkinson's disease (HCC) 08/06/2013  . Dyslipidemia 08/06/2013  . S/P mitral valve  repair 08/06/2013    [facility] ,MS, CCC-SLP  06/29/2017, 8:28 AM  [facility] [address] [place], [place], [address] Phone: [phone]   Fax:  [phone]  Name: [name] [name] MRN: [id number] Date of Birth: 05-27-1932

## 2017-06-29 NOTE — Patient Instructions (Signed)
Complete 5 loud "ah" twice each day.

## 2017-06-30 ENCOUNTER — Ambulatory Visit (INDEPENDENT_AMBULATORY_CARE_PROVIDER_SITE_OTHER): Payer: Medicare Other | Admitting: *Deleted

## 2017-06-30 DIAGNOSIS — I480 Paroxysmal atrial fibrillation: Secondary | ICD-10-CM | POA: Diagnosis not present

## 2017-07-05 ENCOUNTER — Encounter: Payer: Medicare Other | Admitting: Speech Pathology

## 2017-07-05 ENCOUNTER — Encounter: Payer: Medicare Other | Admitting: Occupational Therapy

## 2017-07-07 ENCOUNTER — Ambulatory Visit: Payer: Medicare Other | Admitting: Occupational Therapy

## 2017-07-07 ENCOUNTER — Ambulatory Visit: Payer: Medicare Other | Admitting: Speech Pathology

## 2017-07-07 DIAGNOSIS — R471 Dysarthria and anarthria: Secondary | ICD-10-CM

## 2017-07-07 DIAGNOSIS — R293 Abnormal posture: Secondary | ICD-10-CM

## 2017-07-07 DIAGNOSIS — R29898 Other symptoms and signs involving the musculoskeletal system: Secondary | ICD-10-CM

## 2017-07-07 DIAGNOSIS — R278 Other lack of coordination: Secondary | ICD-10-CM

## 2017-07-07 DIAGNOSIS — R4701 Aphasia: Secondary | ICD-10-CM

## 2017-07-07 DIAGNOSIS — R29818 Other symptoms and signs involving the nervous system: Secondary | ICD-10-CM

## 2017-07-07 DIAGNOSIS — R41842 Visuospatial deficit: Secondary | ICD-10-CM

## 2017-07-07 DIAGNOSIS — R2689 Other abnormalities of gait and mobility: Secondary | ICD-10-CM

## 2017-07-07 DIAGNOSIS — R41840 Attention and concentration deficit: Secondary | ICD-10-CM

## 2017-07-07 LAB — CUP PACEART REMOTE DEVICE CHECK
Date Time Interrogation Session: 20180810001003
MDC IDC PG IMPLANT DT: 20160421

## 2017-07-07 NOTE — Therapy (Signed)
[facility] [address] [place], [place], [address] Phone: [phone]   Fax:  [phone]  Speech Language Pathology Treatment  Patient Details  Name: [name] [name] MRN: [id number] Date of Birth: 1932/06/16 Referring Provider: Jaquita Folds, MD  Encounter Date: 07/07/2017      End of Session - 07/07/17 1427    Visit Number 2   Number of Visits 17   Date for SLP Re-Evaluation 09/16/17   SLP Start Time 1233   SLP Stop Time  1316   SLP Time Calculation (min) 43 min   Activity Tolerance Patient tolerated treatment well      Past Medical History:  Diagnosis Date  . Dyslipidemia   . Parkinson's disease (HCC)   . S/P mitral valve replacement    mitral regurg, endocarditis  . Stroke [facility])     Past Surgical History:  Procedure Laterality Date  . CATARACT EXTRACTION  2009, 2012   right 2009, left 2012  . LOOP RECORDER IMPLANT Left 03/13/2015   Procedure: LOOP RECORDER IMPLANT;  Surgeon: Marinus Maw, MD;  Location: [facility] CATH LAB;  Service: Cardiovascular;  Laterality: Left;  . 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 TEE WITHOUT CARDIOVERSION N/A 03/13/2015   Procedure: TRANSESOPHAGEAL ECHOCARDIOGRAM (TEE);  Surgeon: Laurey Morale, MD;  Location: [facility] ENDOSCOPY;  Service: Cardiovascular;  Laterality: N/A;  . TRANSTHORACIC ECHOCARDIOGRAM  08/15/2012   EF=>55%; normal LV systolic function; RV mildly dilated & systolic function mildly reduced; RA mod dilated; mild -mod MR & mildy increased gradients; mild TR; AV mildly sclerotic with mild-mod regurg    There were no vitals filed for this visit.      Subjective Assessment - 07/07/17 1243    Subjective "I've been going to music class for PD"   Patient is accompained by: Family member   Special Tests spouse               ADULT SLP TREATMENT - 07/07/17 1249      General  Information   Behavior/Cognition Alert;Cooperative;Pleasant mood     Treatment Provided   Treatment provided Cognitive-Linquistic     Cognitive-Linquistic Treatment   Treatment focused on Dysarthria;Aphasia   Skilled Treatment Initiated training in abdominal breathing with occasional min A in isolation, however pt required  consistent mod to max to carryover breathing in simple sentence oral reading tasks and structured tasks. Loud /a/ average of 90dB, with consistent mod A for volume and breath support. As session progressed SLP A reduced to occasional to usual min A for volume and breath support in short utterances in structured tasks.      Assessment / Recommendations / Plan   Plan Continue with current plan of care     Progression Toward Goals   Progression toward goals Progressing toward goals          SLP Education - 07/07/17 1423    Education provided Yes   Education Details Abdominal breathing, breath support for speech, environmental compensations for intellgibility   Person(s) Educated Patient;Spouse   Methods Explanation;Demonstration;Handout;Verbal cues   Comprehension Verbalized understanding;Returned demonstration;Verbal cues required;Need further instruction          SLP Short Term Goals - 07/07/17 1426      SLP SHORT TERM GOAL #1   Title pt will maintain loud /a/ average 95dB x 4 sessions   Time 4   Period Weeks  Status On-going     SLP SHORT TERM GOAL #2   Title pt will demo 18/20 sentences with average low 70s dB x2 sessions   Time 4   Period Weeks   Status On-going     SLP SHORT TERM GOAL #3   Title pt will demo abdominal breatihng at rest 70% of the time over 3 minutes   Time 4   Period Weeks   Status On-going     SLP SHORT TERM GOAL #4   Title pt will complete simple naming tasks with 85% success over 3 sessions   Time 4   Period Weeks   Status On-going          SLP Long Term Goals - 07/07/17 1426      SLP LONG TERM GOAL #1   Title  pt will demo loudness average low 70s dB in 10 minutes simple-mod complex conversation with rare nonverbal cues x3 sessions   Time 8   Period Weeks     SLP LONG TERM GOAL #2   Title pt will demo abdominal breathing for 50% of a simple 8 minute conversation x 2 sessions   Time 8   Period Weeks   Status On-going     SLP LONG TERM GOAL #3   Title pt will tell SLP 3 s/s of oropharyngeal dysphagia with modified independence   Time 8   Period Weeks   Status On-going     SLP LONG TERM GOAL #4   Title pt will exhibit WFL word finding using compensations, in 10 minutes simple-mod complex conversation   Time 8   Period Weeks   Status On-going          Plan - 07/07/17 1424    Clinical Impression Statement Pt required frequent mod to max A initially to carryover abdominal breathing to short structured utterances, however by end of session, this improved to usual min A. Pt with rapid rate, attempting to push out words without adequate breath in conversation - mod A to reduce rate. 70dB average today in simple structured and oral reading tasks. Continue skilled ST to maximize intellgilbity to reduce spouse burden and QOL.   Speech Therapy Frequency 2x / week   Treatment/Interventions Internal/external aids;SLP instruction and feedback;Language facilitation;Compensatory techniques;Environmental controls;Multimodal communcation approach;Cueing hierarchy;Functional tasks;Patient/family education   Potential to Achieve Goals Good   Potential Considerations Severity of impairments      Patient will benefit from skilled therapeutic intervention in order to improve the following deficits and impairments:   Dysarthria and anarthria  Aphasia    Problem List Patient Active Problem List   Diagnosis Date Noted  . Encounter for therapeutic drug monitoring 01/05/2016  . Paroxysmal atrial fibrillation (HCC) 12/24/2015  . Cerebral infarction due to embolism of left middle cerebral artery (HCC)  06/02/2015  . HLD (hyperlipidemia) 06/02/2015  . PD (Parkinson's disease) (HCC) 06/02/2015  . Mitral regurgitation 03/17/2015  . CVA (cerebral infarction)   . TIA (transient ischemic attack) 03/05/2015  . Slurred speech 03/05/2015  . Anxiety 12/19/2013  . Parkinson's disease (HCC) 08/06/2013  . Dyslipidemia 08/06/2013  . S/P mitral valve repair 08/06/2013    Yon Schiffman, Radene JourneyLaura Ann MS, CCC-SLP 07/07/2017, 2:27 PM  [facility] [address] [place], [place], [address] Phone: [phone]   Fax:  [phone]   Name: [name] HensenJohn [name] MRN: [id number] Date of Birth: 04/30/1932

## 2017-07-07 NOTE — Patient Instructions (Addendum)
PWR! Hand Exercises Then, start with elbows bent and hands closed:   PWR! Hands: Push hands out BIG. Elbows straight, wrists up, fingers open and spread apart BIG. (Can also perform by pushing down on table, chair, knees. Push above head, out to the side, behind you, in front of you.)  PWR! Step: Touch index finger to thumb while keeping other fingers straight. Flick fingers out BIG (thumb out/straighten fingers). Repeat with other fingers. (Step your thumb to each finger).  x5-10 times each finger  With arms stretched out in front of you (elbows straight), perform the following:   PWR! Rock:  Move wrists up and down [employer]IG  PWR! Twist: Twist palms up and down BIG  ** Make each movement big and deliberate so that you feel the movement.  Perform at least 10 repetitions 1x/day, but perform PWR! Hands throughout the day when you are having trouble using your hands (picking up/manipulating small objects, writing, eating, typing, sewing, buttoning, etc.).   Coordination Exercises  Perform the following exercises for 20 minutes 3x/week.  Perform with both hand(s). Perform using big movements.   Flipping Cards: Place deck of cards on the table. Flip cards over by opening your hand big to grasp and then turn your palm up big, opening hand fully to release.  Deal cards: Hold 1/2 or whole deck in your hand. Use thumb to push card off top of deck with one big push.  Rotate ball with fingertips: Pick up with fingers/thumb and move as much as you can with each turn/movement (clockwise and counter-clockwise).  Pick up coins and stack one at a time: Pick up with big, intentional movements by opening you hand before picking up coin. Do not drag coin to the edge. (5-10 in a stack)  Pick up 5-10 coins one at a time and hold in palm. Then, move coins from palm to fingertips one at time and place in coin bank/container.  Practice writing: Slow down, write big, and focus on forming each letter.  Fasten  nuts/bolts or put on bottle caps: Turn as much/as big as you can with each turn.  Move wrist.

## 2017-07-07 NOTE — Therapy (Signed)
[facility] [address] [place], [place], [address] Phone: [phone]   Fax:  [phone]  Occupational Therapy Treatment  Patient Details  Name: [name] [name] MRN: [id number] Date of Birth: Jul 17, 1932 Referring Provider: Dr.Siddiqui  Encounter Date: 07/07/2017      OT End of Session - 07/07/17 1322    Visit Number 2   Number of Visits 17   Date for OT Re-Evaluation 08/27/17   Authorization Type [employer] MCR   Authorization - Visit Number 2   Authorization - Number of Visits 10   OT Start Time 1323   OT Stop Time 1402   OT Time Calculation (min) 39 min   Activity Tolerance Patient tolerated treatment well   Behavior During Therapy [facility] for tasks assessed/performed      Past Medical History:  Diagnosis Date  . Dyslipidemia   . Parkinson's disease (HCC)   . S/P mitral valve replacement    mitral regurg, endocarditis  . Stroke [facility])     Past Surgical History:  Procedure Laterality Date  . CATARACT EXTRACTION  2009, 2012   right 2009, left 2012  . LOOP RECORDER IMPLANT Left 03/13/2015   Procedure: LOOP RECORDER IMPLANT;  Surgeon: Marinus Maw, MD;  Location: [facility] CATH LAB;  Service: Cardiovascular;  Laterality: Left;  . 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 TEE WITHOUT CARDIOVERSION N/A 03/13/2015   Procedure: TRANSESOPHAGEAL ECHOCARDIOGRAM (TEE);  Surgeon: Laurey Morale, MD;  Location: [facility] ENDOSCOPY;  Service: Cardiovascular;  Laterality: N/A;  . TRANSTHORACIC ECHOCARDIOGRAM  08/15/2012   EF=>55%; normal LV systolic function; RV mildly dilated & systolic function mildly reduced; RA mod dilated; mild -mod MR & mildy increased gradients; mild TR; AV mildly sclerotic with mild-mod regurg    There were no vitals filed for this visit.      Subjective Assessment - 07/07/17 1327    Subjective  Pt hasn't been doing hand HEP, but  is writing (does well  if goes slow)   Patient is accompained by: Family member   Pertinent History PD, hx of CVA mitral valve replacement, dyslipidemia, a-fib   Patient Stated Goals maintain independence   Currently in Pain? No/denies        Reviewed how PD affects repetitive movements and preventing future complications using large movement strategies, use of PWR! Hands prior to picking up small objects, and how coordination exercises relate to functional tasks.  Pt/wife verbalized understanding.                      OT Education - 07/07/17 1344    Education provided Yes   Education Details PWR! hands (basic 4), Coordination HEP--see pt instructions   Person(s) Educated Patient;Spouse   Methods Explanation;Demonstration;Handout;Verbal cues   Comprehension Verbalized understanding;Returned demonstration;Verbal cues required          OT Short Term Goals - 06/28/17 0919      OT SHORT TERM GOAL #1   Title I with PD specific HEP   Time 4   Period Weeks   Status New     OT SHORT TERM GOAL #2   Title Pt will demonstrate improved fine motor coordination for ADLs as evidenced by decreasing RUE  9 hole peg test score to 40 secs or less.   Baseline RUE 43.74, LUE 31.73   Time 4   Period Weeks   Status  New     OT SHORT TERM GOAL #3   Title Pt will demonstrate increased ease with dressing as evidenced by decreasing PPT#4 to 29 secs or less.   Baseline 32.55 secs   Time 4   Period Weeks   Status New           OT Long Term Goals - 06/28/17 [id number]      OT LONG TERM GOAL #1   Title Pt will verbalize understanding of strategies/AE to assist with ADLs/IADLs prn.--check LTGs 01/11/16   Time 8   Period Weeks   Status New     OT LONG TERM GOAL #2   Title Pt / wife will verbalize understanding of compensatroy strategies for short term memory deficits and ways to keep thinking skills sharp.   Time 8   Period Weeks   Status New     OT LONG TERM GOAL #3   Title  Pt will report increased eased with reaching behind back to tuck in shirt, and wash back.   Time 8   Period Weeks   Status New     OT LONG TERM GOAL #4   Title Pt will verbalize understanding of ways to prevent future PD related complications and verbalize understanding of community resources.   Time 8   Period Weeks   Status New     OT LONG TERM GOAL #5   Title --------------------------------------               Plan - 07/07/17 1322    Clinical Impression Statement Pt responded well to cueing for large amplitude movements today.  However, pt has difficulty with R hand opposition to each finger.     Rehab Potential Good   Current Impairments/barriers affecting progress: cognitive deficits   OT Frequency 2x / week  plus eval, may be able to d/c in 4-6 weeks   OT Duration 8 weeks   OT Treatment/Interventions Self-care/ADL training;Moist Heat;Fluidtherapy;DME and/or AE instruction;Splinting;Patient/family education;Balance training;Therapeutic exercises;Ultrasound;Therapeutic exercise;Therapeutic activities;Cognitive remediation/compensation;Passive range of motion;Functional Mobility Training;Neuromuscular education;Energy conservation;Manual Therapy;Visual/perceptual remediation/compensation   Plan getting cards out of wallet and things out of pocket with large amplitude movements, donning/doffing jacket and dressing strategies with large amplitude movements   OT Home Exercise Plan Education provided:  PWR hands (basic 4), coordination HEP   Consulted and Agree with Plan of Care Patient;Family member/caregiver   Family Member Consulted wife      Patient will benefit from skilled therapeutic intervention in order to improve the following deficits and impairments:  Abnormal gait, Decreased coordination, Decreased range of motion, Difficulty walking, Impaired flexibility, Decreased safety awareness, Decreased endurance, Decreased activity tolerance, Decreased knowledge of  precautions, Impaired tone, Impaired UE functional use, Pain, Decreased balance, Decreased cognition, Decreased mobility, Decreased strength, Impaired vision/preception  Visit Diagnosis: Other lack of coordination  Other abnormalities of gait and mobility  Other symptoms and signs involving the nervous system  Other symptoms and signs involving the musculoskeletal system  Attention and concentration deficit  Abnormal posture  Visuospatial deficit    Problem List Patient Active Problem List   Diagnosis Date Noted  . Encounter for therapeutic drug monitoring 01/05/2016  . Paroxysmal atrial fibrillation (HCC) 12/24/2015  . 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 HLD (hyperlipidemia) 06/02/2015  . PD (Parkinson's disease) (HCC) 06/02/2015  . Mitral regurgitation 03/17/2015  . CVA (cerebral infarction)   . TIA (transient ischemic attack) 03/05/2015  . Slurred speech 03/05/2015  . Anxiety 12/19/2013  .  Parkinson's disease (HCC) 08/06/2013  . Dyslipidemia 08/06/2013  . S/P mitral valve repair 08/06/2013    [facility] 07/07/2017, 5:18 PM  [facility] [address] [place], [place], [address] Phone: [phone]   Fax:  [phone]  Name: Tamara Kenyon MRN: [id number] Date of Birth: 1932-10-28   Willa Frater, OTR/L [facility] [address] [place], [place]  [address] [phone] phone [phone] 07/07/17 5:18 PM

## 2017-07-07 NOTE — Patient Instructions (Signed)
  Loud "AH!" 5x twice a day   When you get tired and aren't feeling up to projecting:  Get the persons attention before you speak  Use eye contact and face the person you are speaking to  Be in close proximity to the person you are speaking to  Turn down any noise in the environment such as the TV, walk away from loud appliances, air conditioners, fans, dish washers etc

## 2017-07-11 ENCOUNTER — Ambulatory Visit: Payer: Medicare Other | Admitting: Speech Pathology

## 2017-07-11 ENCOUNTER — Ambulatory Visit: Payer: Medicare Other | Admitting: Occupational Therapy

## 2017-07-11 DIAGNOSIS — R4701 Aphasia: Secondary | ICD-10-CM

## 2017-07-11 DIAGNOSIS — R41840 Attention and concentration deficit: Secondary | ICD-10-CM

## 2017-07-11 DIAGNOSIS — R29818 Other symptoms and signs involving the nervous system: Secondary | ICD-10-CM

## 2017-07-11 DIAGNOSIS — R471 Dysarthria and anarthria: Secondary | ICD-10-CM

## 2017-07-11 DIAGNOSIS — R29898 Other symptoms and signs involving the musculoskeletal system: Secondary | ICD-10-CM

## 2017-07-11 DIAGNOSIS — R41842 Visuospatial deficit: Secondary | ICD-10-CM

## 2017-07-11 DIAGNOSIS — R278 Other lack of coordination: Secondary | ICD-10-CM

## 2017-07-11 DIAGNOSIS — R293 Abnormal posture: Secondary | ICD-10-CM

## 2017-07-11 DIAGNOSIS — R2689 Other abnormalities of gait and mobility: Secondary | ICD-10-CM

## 2017-07-11 DIAGNOSIS — R2681 Unsteadiness on feet: Secondary | ICD-10-CM

## 2017-07-11 NOTE — Therapy (Signed)
[facility] [address] [place], [place], [address] Phone: [phone]   Fax:  [phone]  Occupational Therapy Treatment  Patient Details  Name: [name] [name] MRN: [id number] Date of Birth: 11/10/1932 Referring Provider: Dr.Siddiqui  Encounter Date: 07/11/2017      OT End of Session - 07/11/17 1107    Visit Number 3   Number of Visits 17   Date for OT Re-Evaluation 08/27/17   Authorization Type UHC MCR   Authorization - Visit Number 3   Authorization - Number of Visits 10   OT Start Time 1107   OT Stop Time 1145   OT Time Calculation (min) 38 min   Activity Tolerance Patient tolerated treatment well   Behavior During Therapy [facility] for tasks assessed/performed      Past Medical History:  Diagnosis Date  . Dyslipidemia   . Parkinson's disease (HCC)   . S/P mitral valve replacement    mitral regurg, endocarditis  . Stroke [facility])     Past Surgical History:  Procedure Laterality Date  . CATARACT EXTRACTION  2009, 2012   right 2009, left 2012  . LOOP RECORDER IMPLANT Left 03/13/2015   Procedure: LOOP RECORDER IMPLANT;  Surgeon: Marinus Maw, MD;  Location: [facility] CATH LAB;  Service: Cardiovascular;  Laterality: Left;  . 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 TEE WITHOUT CARDIOVERSION N/A 03/13/2015   Procedure: TRANSESOPHAGEAL ECHOCARDIOGRAM (TEE);  Surgeon: Laurey Morale, MD;  Location: [facility] ENDOSCOPY;  Service: Cardiovascular;  Laterality: N/A;  . TRANSTHORACIC ECHOCARDIOGRAM  08/15/2012   EF=>55%; normal LV systolic function; RV mildly dilated & systolic function mildly reduced; RA mod dilated; mild -mod MR & mildy increased gradients; mild TR; AV mildly sclerotic with mild-mod regurg    There were no vitals filed for this visit.      Subjective Assessment - 07/11/17 1107    Subjective  doing well   Patient is accompained  by: Family member   Pertinent History PD, hx of CVA mitral valve replacement, dyslipidemia, a-fib   Patient Stated Goals maintain independence   Currently in Pain? No/denies        Self Care:     Began instruction/review of importance and  in use of large amplitude movements to prevent future complications related to PD.  Began instruction regarding use of large amplitude movement strategies for ADLs.  Pt verbalized understanding (see pt instructions and below)   Opening/closing bottles with use of large amplitude movement strategies with min cueing.   Dressing:   Practiced buttoning/unbuttoning shirt on table top with min cues for use of PWR! Hands technique (slow down) prior to buttoning and use of deliberate/large amplitude movements for fastening/unfastening after instruction (see pt instructions).  Pt demo improvement with repetition and use of large amplitude movements.  Practiced donning/doffing jacket using large amplitude movement strategies after initial instruction.  Pt demo improvement with min cues for large amplitude movements (put arms in and flip around using trunk rotation).  Practiced donning/doffing socks with min-mod cues for large amplitude movement strategies (gather sock in hand).  Recommended sitting for donning pants to decr fall risk.  Functional mobility:  Sit>stand with min cues to scoot away from table first and then stand with large amplitude movement technique.  OT Short Term Goals - 06/28/17 0919      OT SHORT TERM GOAL #1   Title I with PD specific HEP   Time 4   Period Weeks   Status New     OT SHORT TERM GOAL #2   Title Pt will demonstrate improved fine motor coordination for ADLs as evidenced by decreasing RUE  9 hole peg test score to 40 secs or less.   Baseline RUE 43.74, LUE 31.73   Time 4   Period Weeks   Status New     OT SHORT TERM GOAL #3   Title Pt will demonstrate increased ease  with dressing as evidenced by decreasing PPT#4 to 29 secs or less.   Baseline 32.55 secs   Time 4   Period Weeks   Status New           OT Long Term Goals - 06/28/17 2841      OT LONG TERM GOAL #1   Title Pt will verbalize understanding of strategies/AE to assist with ADLs/IADLs prn.--check LTGs 01/11/16   Time 8   Period Weeks   Status New     OT LONG TERM GOAL #2   Title Pt / wife will verbalize understanding of compensatroy strategies for short term memory deficits and ways to keep thinking skills sharp.   Time 8   Period Weeks   Status New     OT LONG TERM GOAL #3   Title Pt will report increased eased with reaching behind back to tuck in shirt, and wash back.   Time 8   Period Weeks   Status New     OT LONG TERM GOAL #4   Title Pt will verbalize understanding of ways to prevent future PD related complications and verbalize understanding of community resources.   Time 8   Period Weeks   Status New     OT LONG TERM GOAL #5   Title --------------------------------------               Plan - 07/11/17 1114    Rehab Potential Good   Current Impairments/barriers affecting progress: cognitive deficits   OT Frequency 2x / week  plus eval, may be able to d/c in 4-6 weeks   OT Duration 8 weeks   OT Treatment/Interventions Self-care/ADL training;Moist Heat;Fluidtherapy;DME and/or AE instruction;Splinting;Patient/family education;Balance training;Therapeutic exercises;Ultrasound;Therapeutic exercise;Therapeutic activities;Cognitive remediation/compensation;Passive range of motion;Functional Mobility Training;Neuromuscular education;Energy conservation;Manual Therapy;Visual/perceptual remediation/compensation   OT Home Exercise Plan Education provided:  PWR hands (basic 4), coordination HEP   Consulted and Agree with Plan of Care Patient;Family member/caregiver   Family Member Consulted wife      Patient will benefit from skilled therapeutic intervention in order  to improve the following deficits and impairments:  Abnormal gait, Decreased coordination, Decreased range of motion, Difficulty walking, Impaired flexibility, Decreased safety awareness, Decreased endurance, Decreased activity tolerance, Decreased knowledge of precautions, Impaired tone, Impaired UE functional use, Pain, Decreased balance, Decreased cognition, Decreased mobility, Decreased strength, Impaired vision/preception  Visit Diagnosis: Other lack of coordination  Other symptoms and signs involving the nervous system  Other symptoms and signs involving the musculoskeletal system  Attention and concentration deficit  Abnormal posture  Visuospatial deficit  Unsteadiness on feet  Other abnormalities of gait and mobility    Problem List Patient Active Problem List   Diagnosis Date Noted  . Encounter for therapeutic drug monitoring 01/05/2016  . Paroxysmal atrial fibrillation (HCC) 12/24/2015  . Cerebral infarction due to embolism of left middle cerebral artery (  HCC) 06/02/2015  . HLD (hyperlipidemia) 06/02/2015  . PD (Parkinson's disease) (HCC) 06/02/2015  . Mitral regurgitation 03/17/2015  . CVA (cerebral infarction)   . TIA (transient ischemic attack) 03/05/2015  . Slurred speech 03/05/2015  . Anxiety 12/19/2013  . Parkinson's disease (HCC) 08/06/2013  . Dyslipidemia 08/06/2013  . S/P mitral valve repair 08/06/2013    [facility] 07/11/2017, 11:23 AM  [facility] [address] [place], [place], [address] Phone: [phone]   Fax:  [phone]  Name: Quinntin Malter MRN: [id number] Date of Birth: 1932-01-18   Willa Frater, OTR/L [facility] [address] [place], [place]  [address] [phone] phone [phone] 07/11/17 2:44 PM

## 2017-07-11 NOTE — Therapy (Signed)
[facility] [address] [place], [place], [address] Phone: [phone]   Fax:  [phone]  Speech Language Pathology Treatment  Patient Details  Name: [name] [name] MRN: [id number] Date of Birth: [birth year] Referring Provider: Jaquita Folds, MD  Encounter Date: 07/11/2017      End of Session - 07/11/17 1256    Visit Number 3   Number of Visits 17   Date for SLP Re-Evaluation 09/16/17   SLP Start Time 1016   SLP Stop Time  1100   SLP Time Calculation (min) 44 min   Activity Tolerance Patient tolerated treatment well      Past Medical History:  Diagnosis Date  . Dyslipidemia   . Parkinson's disease (HCC)   . S/P mitral valve replacement    mitral regurg, endocarditis  . Stroke [facility])     Past Surgical History:  Procedure Laterality Date  . CATARACT EXTRACTION  2009, 2012   right 2009, left 2012  . LOOP RECORDER IMPLANT Left 03/13/2015   Procedure: LOOP RECORDER IMPLANT;  Surgeon: Marinus Maw, MD;  Location: [facility] CATH LAB;  Service: Cardiovascular;  Laterality: Left;  . 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 TEE WITHOUT CARDIOVERSION N/A 03/13/2015   Procedure: TRANSESOPHAGEAL ECHOCARDIOGRAM (TEE);  Surgeon: Laurey Morale, MD;  Location: [facility] ENDOSCOPY;  Service: Cardiovascular;  Laterality: N/A;  . TRANSTHORACIC ECHOCARDIOGRAM  08/15/2012   EF=>55%; normal LV systolic function; RV mildly dilated & systolic function mildly reduced; RA mod dilated; mild -mod MR & mildy increased gradients; mild TR; AV mildly sclerotic with mild-mod regurg    There were no vitals filed for this visit.      Subjective Assessment - 07/11/17 1024    Subjective "We had company and didn't get loud "AH" done.    Patient is accompained by: Family member   Special Tests spouse   Currently in Pain? No/denies               ADULT SLP  TREATMENT - 07/11/17 1025      General Information   Behavior/Cognition Alert;Cooperative;Pleasant mood     Treatment Provided   Treatment provided Cognitive-Linquistic     Cognitive-Linquistic Treatment   Treatment focused on Dysarthria;Aphasia   Skilled Treatment Utilized loud /a/ to recalibrate loudness with initial cue for abdominal breathing with averge of 90dB. Oral reading 10+ word sentences to coordinate abdominal breathing a phonation. Structured speech tasks of generating multiple meaning sentences with usual mod visual, verbal cues for reduced rate, breath support and volume.  Spouse and pt report pt very distractable with difficulty completing therapy homework/exercises or chores at home. Suggested a to do list with items to be crossed off before moving on to the next task.      Assessment / Recommendations / Plan   Plan Continue with current plan of care     Progression Toward Goals   Progression toward goals Progressing toward goals          SLP Education - 07/11/17 1254    Education provided Yes   Education Details abdominal breathing, using to do list to keep to on task, reduced rate   Person(s) Educated Patient;Spouse   Methods Demonstration;Verbal cues   Comprehension Verbalized understanding;Returned demonstration;Verbal cues required;Need further instruction          SLP Short Term Goals - 07/11/17 1255  SLP SHORT TERM GOAL #1   Title pt will maintain loud /a/ average 95dB x 4 sessions   Time 3   Period Weeks   Status On-going     SLP SHORT TERM GOAL #2   Title pt will demo 18/20 sentences with average low 70s dB x2 sessions   Time 3   Period Weeks   Status On-going     SLP SHORT TERM GOAL #3   Title pt will demo abdominal breatihng at rest 70% of the time over 3 minutes   Time 3   Period Weeks   Status On-going     SLP SHORT TERM GOAL #4   Title pt will complete simple naming tasks with 85% success over 3 sessions   Time 3   Period  Weeks   Status On-going          SLP Long Term Goals - 07/11/17 1255      SLP LONG TERM GOAL #1   Title pt will demo loudness average low 70s dB in 10 minutes simple-mod complex conversation with rare nonverbal cues x3 sessions   Time 7   Period Weeks     SLP LONG TERM GOAL #2   Title pt will demo abdominal breathing for 50% of a simple 8 minute conversation x 2 sessions   Time 7   Period Weeks   Status On-going     SLP LONG TERM GOAL #3   Title pt will tell SLP 3 s/s of oropharyngeal dysphagia with modified independence   Time 7   Period Weeks   Status On-going     SLP LONG TERM GOAL #4   Title pt will exhibit WFL word finding using compensations, in 10 minutes simple-mod complex conversation   Time 7   Period Weeks   Status On-going          Plan - 07/11/17 1255    Clinical Impression Statement Pt required frequent mod to max A initially to carryover abdominal breathing to short structured utterances, however by end of session, this improved to usual min A. Pt with rapid rate, attempting to push out words without adequate breath in conversation - mod A to reduce rate. 70dB average today in simple structured and oral reading tasks. Continue skilled ST to maximize intellgilbity to reduce spouse burden and QOL.   Speech Therapy Frequency 2x / week   Treatment/Interventions Internal/external aids;SLP instruction and feedback;Language facilitation;Compensatory techniques;Environmental controls;Multimodal communcation approach;Cueing hierarchy;Functional tasks;Patient/family education   Potential to Achieve Goals Good   Potential Considerations Severity of impairments      Patient will benefit from skilled therapeutic intervention in order to improve the following deficits and impairments:   Dysarthria and anarthria  Aphasia    Problem List Patient Active Problem List   Diagnosis Date Noted  . Encounter for therapeutic drug monitoring 01/05/2016  . Paroxysmal  atrial fibrillation (HCC) 12/24/2015  . 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 HLD (hyperlipidemia) 06/02/2015  . PD (Parkinson's disease) (HCC) 06/02/2015  . Mitral regurgitation 03/17/2015  . CVA (cerebral infarction)   . TIA (transient ischemic attack) 03/05/2015  . Slurred speech 03/05/2015  . Anxiety 12/19/2013  . Parkinson's disease (HCC) 08/06/2013  . Dyslipidemia 08/06/2013  . S/P mitral valve repair 08/06/2013    Zyara Riling, Radene Journey  MS, CCC-SLP 07/11/2017, 12:56 PM  [facility] [address] [place], [place], [address] Phone: [phone]   Fax:  [phone]  Name: [name] [name] MRN: [id number] Date of Birth: 1932-10-03

## 2017-07-11 NOTE — Patient Instructions (Signed)
  Performing Daily Activities with Big Movements  Pick at least 2 activities a day and perform with BIG, DELIBERATE movements/effort.  Try different activities each day. This can make the activity easier and turn daily activities into exercise to prevent problems in the future!  If you are standing during the activity, make sure to keep feet apart and stand with good/big/PWR! UP posture.  Examples:  Dressing - Push arms in sleeves, twist when putting on jacket, push foot into pants, open hands to pull down shirt/put on socks/pull up pants  Buttoning - Open hands big (PWR! Hands) before fastening each button  Bathing - Wash/dry with long strokes  Brushing your teeth - Big, slow movements  Cutting food - Long deliberate cuts  Eating - Hold utensil in the middle, not the end  Picking up a cup/bottle - Open hand up big and get object all the way in palm  Opening jar/bottle - Move as much as you can with each turn  Putting on seatbelt - Twist when reaching and look where you are reaching or fastening it.  Hanging up clothes/getting clothes down from closet - Reach with big effort  Putting away groceries/dishes - Reach with big effort  Wiping counter/table - Move in big, long strokes  Standing up from a chair/recliner/sofa - Scoot forward, lean forward, and stand with big effort.  If you are scooted up close to the table, scoot chair back first.  When buttoning shirt, wear your reading glasses.  Open hand big (hold 1-2sec) then try to button.   When unfastening buttons, pinch button with your left hand, use right hand to pull shirt apart to open button hole before pushing button through.  When getting wallet out of pocket, twist toward that side, then make sure you put flat hand into pocket.  When putting socks on, push thumbs down into sock to gather before putting it on.

## 2017-07-14 ENCOUNTER — Ambulatory Visit: Payer: Medicare Other | Admitting: Occupational Therapy

## 2017-07-14 ENCOUNTER — Ambulatory Visit: Payer: Medicare Other | Admitting: Speech Pathology

## 2017-07-14 DIAGNOSIS — R471 Dysarthria and anarthria: Secondary | ICD-10-CM | POA: Diagnosis not present

## 2017-07-14 DIAGNOSIS — R29818 Other symptoms and signs involving the nervous system: Secondary | ICD-10-CM

## 2017-07-14 DIAGNOSIS — R41840 Attention and concentration deficit: Secondary | ICD-10-CM

## 2017-07-14 DIAGNOSIS — R278 Other lack of coordination: Secondary | ICD-10-CM

## 2017-07-14 DIAGNOSIS — R293 Abnormal posture: Secondary | ICD-10-CM

## 2017-07-14 DIAGNOSIS — R29898 Other symptoms and signs involving the musculoskeletal system: Secondary | ICD-10-CM

## 2017-07-14 NOTE — Therapy (Signed)
[facility] [address] [place], [place], [address] Phone: [phone]   Fax:  [phone]  Occupational Therapy Treatment  Patient Details  Name: [name] [name] MRN: [id number] Date of Birth: 30-Oct-1932 Referring Provider: Dr.Siddiqui  Encounter Date: 07/14/2017      OT End of Session - 07/14/17 1241    Visit Number 4   Number of Visits 17   Date for OT Re-Evaluation 08/27/17   Authorization Type UHC MCR   Authorization - Visit Number 4   Authorization - Number of Visits 10   OT Start Time 0851   OT Stop Time 0930   OT Time Calculation (min) 39 min      Past Medical History:  Diagnosis Date  . Dyslipidemia   . Parkinson's disease (HCC)   . S/P mitral valve replacement    mitral regurg, endocarditis  . Stroke [facility])     Past Surgical History:  Procedure Laterality Date  . CATARACT EXTRACTION  2009, 2012   right 2009, left 2012  . LOOP RECORDER IMPLANT Left 03/13/2015   Procedure: LOOP RECORDER IMPLANT;  Surgeon: Marinus Maw, MD;  [facility] CATH LAB;  Service: Cardiovascular;  Laterality: Left;  . 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 TEE WITHOUT CARDIOVERSION N/A 03/13/2015   Procedure: TRANSESOPHAGEAL ECHOCARDIOGRAM (TEE);  Surgeon: Laurey Morale, MD;  Location: [facility] ENDOSCOPY;  Service: Cardiovascular;  Laterality: N/A;  . TRANSTHORACIC ECHOCARDIOGRAM  08/15/2012   EF=>55%; normal LV systolic function; RV mildly dilated & systolic function mildly reduced; RA mod dilated; mild -mod MR & mildy increased gradients; mild TR; AV mildly sclerotic with mild-mod regurg    There were no vitals filed for this visit.      Subjective Assessment - 07/14/17 0853    Pertinent History PD, hx of CVA mitral valve replacement, dyslipidemia, a-fib   Patient Stated Goals maintain independence   Currently in Pain? No/denies          Treatment; Reviewed PWR! Hands basic 4, 5-10 reps each exercise, min-mod v.c for performance Fine motor coordination activities: Flipping and dealing cards with big movements, picking up and stacking coins , rotating and tossing ball with bilateral UE's mod v.c for techniques, and for larger amplitude movements.                       OT Short Term Goals - 06/28/17 0919      OT SHORT TERM GOAL #1   Title I with PD specific HEP   Time 4   Period Weeks   Status New     OT SHORT TERM GOAL #2   Title Pt will demonstrate improved fine motor coordination for ADLs as evidenced by decreasing RUE  9 hole peg test score to 40 secs or less.   Baseline RUE 43.74, LUE 31.73   Time 4   Period Weeks   Status New     OT SHORT TERM GOAL #3   Title Pt will demonstrate increased ease with dressing as evidenced by decreasing PPT#4 to 29 secs or less.   Baseline 32.55 secs   Time 4   Period Weeks   Status New           OT Long Term Goals - 06/28/17 6962      OT LONG TERM GOAL #1   Title Pt will verbalize understanding  of strategies/AE to assist with ADLs/IADLs prn.--check LTGs 01/11/16   Time 8   Period Weeks   Status New     OT LONG TERM GOAL #2   Title Pt / wife will verbalize understanding of compensatroy strategies for short term memory deficits and ways to keep thinking skills sharp.   Time 8   Period Weeks   Status New     OT LONG TERM GOAL #3   Title Pt will report increased eased with reaching behind back to tuck in shirt, and wash back.   Time 8   Period Weeks   Status New     OT LONG TERM GOAL #4   Title Pt will verbalize understanding of ways to prevent future PD related complications and verbalize understanding of community resources.   Time 8   Period Weeks   Status New     OT LONG TERM GOAL #5   Title --------------------------------------               Plan - 07/14/17 1242    Clinical Impression Statement Pt is progressing  towards goals. Pt demonstates improved performance of fine motor coordination tasks following repetition.   Rehab Potential Good   Current Impairments/barriers affecting progress: cognitive deficits   OT Frequency 2x / week   OT Duration 8 weeks   OT Treatment/Interventions Self-care/ADL training;Moist Heat;Fluidtherapy;DME and/or AE instruction;Splinting;Patient/family education;Balance training;Therapeutic exercises;Ultrasound;Therapeutic exercise;Therapeutic activities;Cognitive remediation/compensation;Passive range of motion;Functional Mobility Training;Neuromuscular education;Energy conservation;Manual Therapy;Visual/perceptual remediation/compensation   Plan continue to reinforce strategies for ADLs   OT Home Exercise Plan Education provided:  PWR hands (basic 4), coordination HEP; ADLs with large movements   Consulted and Agree with Plan of Care Patient;Family member/caregiver   Family Member Consulted wife      Patient will benefit from skilled therapeutic intervention in order to improve the following deficits and impairments:  Abnormal gait, Decreased coordination, Decreased range of motion, Difficulty walking, Impaired flexibility, Decreased safety awareness, Decreased endurance, Decreased activity tolerance, Decreased knowledge of precautions, Impaired tone, Impaired UE functional use, Pain, Decreased balance, Decreased cognition, Decreased mobility, Decreased strength, Impaired vision/preception  Visit Diagnosis: Other lack of coordination  Other symptoms and signs involving the nervous system  Other symptoms and signs involving the musculoskeletal system  Attention and concentration deficit  Abnormal posture    Problem List Patient Active Problem List   Diagnosis Date Noted  . Encounter for therapeutic drug monitoring 01/05/2016  . Paroxysmal atrial fibrillation (HCC) 12/24/2015  . 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  HLD (hyperlipidemia) 06/02/2015  . PD (Parkinson's disease) (HCC) 06/02/2015  . Mitral regurgitation 03/17/2015  . CVA (cerebral infarction)   . TIA (transient ischemic attack) 03/05/2015  . Slurred speech 03/05/2015  . Anxiety 12/19/2013  . Parkinson's disease (HCC) 08/06/2013  . Dyslipidemia 08/06/2013  . S/P mitral valve repair 08/06/2013    RINE,KATHRYN 07/14/2017, 12:44 PM  [facility] [address] [place], [place], [address] Phone: [phone]   Fax:  [phone]  Name: [name] [name] MRN: [id number] Date of Birth: [birth year]

## 2017-07-14 NOTE — Therapy (Signed)
[facility] [address] [place], [place], [address] Phone: [phone]   Fax:  [phone]  Speech Language Pathology Treatment  Patient Details  Name: [name] [name] MRN: [id number] Date of Birth: 06-Feb-1932 Referring Provider: Jaquita Folds, MD  Encounter Date: 07/14/2017      End of Session - 07/14/17 0845    Visit Number 4   Number of Visits 17   Date for SLP Re-Evaluation 09/16/17   SLP Start Time 0807  Pt 7 min late   SLP Stop Time  0846   SLP Time Calculation (min) 39 min   Activity Tolerance Patient tolerated treatment well      Past Medical History:  Diagnosis Date  . Dyslipidemia   . Parkinson's disease (HCC)   . S/P mitral valve replacement    mitral regurg, endocarditis  . Stroke [facility])     Past Surgical History:  Procedure Laterality Date  . CATARACT EXTRACTION  2009, 2012   right 2009, left 2012  . LOOP RECORDER IMPLANT Left 03/13/2015   Procedure: LOOP RECORDER IMPLANT;  Surgeon: Marinus Maw, MD;  Location: [facility] CATH LAB;  Service: Cardiovascular;  Laterality: Left;  . 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 TEE WITHOUT CARDIOVERSION N/A 03/13/2015   Procedure: TRANSESOPHAGEAL ECHOCARDIOGRAM (TEE);  Surgeon: Laurey Morale, MD;  Location: [facility] ENDOSCOPY;  Service: Cardiovascular;  Laterality: N/A;  . TRANSTHORACIC ECHOCARDIOGRAM  08/15/2012   EF=>55%; normal LV systolic function; RV mildly dilated & systolic function mildly reduced; RA mod dilated; mild -mod MR & mildy increased gradients; mild TR; AV mildly sclerotic with mild-mod regurg    There were no vitals filed for this visit.      Subjective Assessment - 07/14/17 0814    Subjective "I thought the wife was havign hearing problems but it turns out I was the problem."    Patient is accompained by: Family member   Special Tests spouse   Currently  in Pain? No/denies               ADULT SLP TREATMENT - 07/14/17 0815      General Information   Behavior/Cognition Alert;Cooperative;Pleasant mood   Patient Positioning Upright in chair   Oral care provided N/A     Treatment Provided   Treatment provided Cognitive-Linquistic     Pain Assessment   Pain Assessment No/denies pain     Cognitive-Linquistic Treatment   Treatment focused on Dysarthria   Skilled Treatment Utilized loud /a/ to recalibrate loudness with initial cue for abdominal breathing with averge of 91dB. Short sentences 76 dB avg. Oral reading 10+ word sentences 74 dB avg with occasional min A to coordinate abdominal breathing and phonation, short rushes of speech. With increased cognitive load, structured sentence speech tasks avg 71 dB and mod cues for short rushes of speech. Spontaneous conversation avg 65.8dB improves to 71 dB with consistment min-mod A.     Assessment / Recommendations / Plan   Plan Continue with current plan of care     Progression Toward Goals   Progression toward goals Progressing toward goals          SLP Education - 07/14/17 0844    Education provided Yes   Education Details abdominal breathing, short rushes of speech, importance of HEP for carryover   Person(s) Educated Patient;Spouse   Methods Explanation;Demonstration   Comprehension Verbalized understanding;Returned demonstration;Verbal cues  required;Need further instruction          SLP Short Term Goals - 07/14/17 3151      SLP SHORT TERM GOAL #1   Title pt will maintain loud /a/ average 95dB x 4 sessions   Time 3   Period Weeks   Status On-going     SLP SHORT TERM GOAL #2   Title pt will demo 18/20 sentences with average low 70s dB x2 sessions   Time 3   Period Weeks   Status On-going     SLP SHORT TERM GOAL #3   Title pt will demo abdominal breatihng at rest 70% of the time over 3 minutes   Time 3   Period Weeks   Status On-going     SLP SHORT TERM GOAL  #4   Title pt will complete simple naming tasks with 85% success over 3 sessions   Time 3   Period Weeks   Status On-going          SLP Long Term Goals - 07/14/17 1022      SLP LONG TERM GOAL #1   Title pt will demo loudness average low 70s dB in 10 minutes simple-mod complex conversation with rare nonverbal cues x3 sessions   Time 7   Period Weeks   Status On-going     SLP LONG TERM GOAL #2   Title pt will demo abdominal breathing for 50% of a simple 8 minute conversation x 2 sessions   Time 7   Period Weeks   Status On-going     SLP LONG TERM GOAL #3   Title pt will tell SLP 3 s/s of oropharyngeal dysphagia with modified independence   Time 7   Period Weeks   Status On-going          Plan - 07/14/17 0845    Clinical Impression Statement Pt independent with AB at short sentence level, required occasional min-mod A with sentences. Pt with rapid rate, attempting to push out words without adequate breath in conversation - mod A to reduce rate. Continue skilled ST to maximize intellgilbity to reduce spouse burden and QOL.   Speech Therapy Frequency 2x / week   Treatment/Interventions Internal/external aids;SLP instruction and feedback;Language facilitation;Compensatory techniques;Environmental controls;Multimodal communcation approach;Cueing hierarchy;Functional tasks;Patient/family education   Potential to Achieve Goals Good   Potential Considerations Severity of impairments   Consulted and Agree with Plan of Care Patient;Family member/caregiver      Patient will benefit from skilled therapeutic intervention in order to improve the following deficits and impairments:   Dysarthria and anarthria    Problem List Patient Active Problem List   Diagnosis Date Noted  . Encounter for therapeutic drug monitoring 01/05/2016  . Paroxysmal atrial fibrillation (HCC) 12/24/2015  . 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 HLD  (hyperlipidemia) 06/02/2015  . PD (Parkinson's disease) (HCC) 06/02/2015  . Mitral regurgitation 03/17/2015  . CVA (cerebral infarction)   . TIA (transient ischemic attack) 03/05/2015  . Slurred speech 03/05/2015  . Anxiety 12/19/2013  . Parkinson's disease (HCC) 08/06/2013  . Dyslipidemia 08/06/2013  . S/P mitral valve repair 08/06/2013   Rondel Baton, MS, CCC-SLP Speech-Language Pathologist  Arlana Lindau 07/14/2017, 10:23 AM  [facility] [address] [place], [place], [address] Phone: [phone]   Fax:  [phone]   Name: [name] [name] MRN: [id number] Date of Birth: 13-Jun-1932

## 2017-07-15 ENCOUNTER — Encounter: Payer: Medicare Other | Admitting: Occupational Therapy

## 2017-07-19 ENCOUNTER — Ambulatory Visit: Payer: Medicare Other | Admitting: Occupational Therapy

## 2017-07-19 ENCOUNTER — Ambulatory Visit: Payer: Medicare Other | Admitting: Speech Pathology

## 2017-07-19 DIAGNOSIS — R29898 Other symptoms and signs involving the musculoskeletal system: Secondary | ICD-10-CM

## 2017-07-19 DIAGNOSIS — R471 Dysarthria and anarthria: Secondary | ICD-10-CM | POA: Diagnosis not present

## 2017-07-19 DIAGNOSIS — R4701 Aphasia: Secondary | ICD-10-CM

## 2017-07-19 DIAGNOSIS — R41840 Attention and concentration deficit: Secondary | ICD-10-CM

## 2017-07-19 DIAGNOSIS — R278 Other lack of coordination: Secondary | ICD-10-CM

## 2017-07-19 DIAGNOSIS — R29818 Other symptoms and signs involving the nervous system: Secondary | ICD-10-CM

## 2017-07-19 NOTE — Therapy (Signed)
[facility] [address] [place], [place], [address] Phone: [phone]   Fax:  [phone]  Speech Language Pathology Treatment  Patient Details  Name: [name] [name] MRN: [id number] Date of Birth: [birth year] Referring Provider: Eldridge Abrahams, MD  Encounter Date: 07/19/2017      End of Session - 07/19/17 1117    Visit Number 5   Number of Visits 17   Date for SLP Re-Evaluation 09/16/17   SLP Start Time 1017   SLP Stop Time  1102   SLP Time Calculation (min) 45 min   Activity Tolerance Patient tolerated treatment well      Past Medical History:  Diagnosis Date  . Dyslipidemia   . Parkinson's disease ([place])   . S/P mitral valve replacement    mitral regurg, endocarditis  . Stroke [facility])     Past Surgical History:  Procedure Laterality Date  . CATARACT EXTRACTION  2009, 2012   right 2009, left 2012  . LOOP RECORDER IMPLANT Left 03/13/2015   Procedure: LOOP RECORDER IMPLANT;  Surgeon: Evans Lance, MD;  Location: [facility] CATH LAB;  Service: Cardiovascular;  Laterality: Left;  . MITRAL VALVE ANNULOPLASTY  04/10/2007    50m Edwards ring; r/t h/o MR and flail mitral leaflets due to endocarditis: MRI safe  . TEE WITHOUT CARDIOVERSION N/A 03/13/2015   Procedure: TRANSESOPHAGEAL ECHOCARDIOGRAM (TEE);  Surgeon: DLarey Dresser MD;  Location[place] [facility]  Service: Cardiovascular;  Laterality: N/A;  . TRANSTHORACIC ECHOCARDIOGRAM  08/15/2012   EF=>55%; normal LV systolic function; RV mildly dilated & systolic function mildly reduced; RA mod dilated; mild -mod MR & mildy increased gradients; mild TR; AV mildly sclerotic with mild-mod regurg    There were no vitals filed for this visit.      Subjective Assessment - 07/19/17 1020    Subjective "We did several Ah's the neighboors didn't come running"   Special Tests spouse               ADULT SLP TREATMENT - 07/19/17 1022      General Information    Behavior/Cognition Alert;Cooperative;Pleasant mood     Treatment Provided   Treatment provided Cognitive-Linquistic     Pain Assessment   Pain Assessment No/denies pain     Cognitive-Linquistic Treatment   Treatment focused on Dysarthria   Skilled Treatment Loud /a/ to recalibrate volume -average of 92dB with occasional coaching during loud /a/ and occasional min A for breath support. Oral reading to facilitated coordidnation of breath support and volume maintainence with usual min A for breath support. Structured speech tasks with consistent mod A for volume and breath support. Simple naming tasks with max questioning cues and instructions to use compensations for aphasia and volume. Simple conversation with consisitent max A to carryover breath support and volume, as volume decreases in conversation to 65dB average.      Assessment / Recommendations / Plan   Plan Continue with current plan of care          SLP Education - 07/19/17 1112    Education provided Yes   Education Details rationale of continuing ST despite OT/PT  discharge likely next week   Person(s) Educated Patient;Spouse   Methods Explanation;Demonstration   Comprehension Verbalized understanding;Need further instruction          SLP Short Term Goals - 07/19/17 1115      SLP SHORT TERM GOAL #1   Title pt will maintain loud /a/ average 95dB x 4 sessions  Baseline 07/19/17;    Time 2   Period Weeks   Status On-going     SLP SHORT TERM GOAL #2   Title pt will demo 18/20 sentences with average low 70s dB x2 sessions   Baseline 07/19/17   Time 2   Period Weeks   Status On-going     SLP SHORT TERM GOAL #3   Title pt will demo abdominal breatihng at rest 70% of the time over 3 minutes   Time 2   Period Weeks   Status On-going     SLP SHORT TERM GOAL #4   Title pt will complete simple naming tasks with 85% success over 3 sessions   Time 2   Period Weeks   Status On-going          SLP Long Term Goals  - 07/19/17 1116      SLP LONG TERM GOAL #1   Title pt will demo loudness average low 70s dB in 10 minutes simple-mod complex conversation with rare nonverbal cues x3 sessions   Time 6   Period Weeks   Status On-going     SLP LONG TERM GOAL #2   Title pt will demo abdominal breathing for 50% of a simple 8 minute conversation x 2 sessions   Time 6   Period Weeks   Status On-going     SLP LONG TERM GOAL #3   Title pt will tell SLP 3 s/s of oropharyngeal dysphagia with modified independence   Time 6   Period Weeks   Status On-going     SLP LONG TERM GOAL #4   Title pt will exhibit WFL word finding using compensations, in 10 minutes simple-mod complex conversation   Time 6   Period Weeks   Status On-going          Plan - 07/19/17 1112    Clinical Impression Statement Pt continues to require mod A to carryover breath support and volume in structured tasks with simple cognitive load. In conversation, consisitent max A to utilize breath support and volume. Per spouse, pt is likely being discharged from PT/OT next week. I explained that [name] [name] has not met his goals and continues to require training and education (skilled ST) to maximize intellgibility and compensations for aphasia I recommend pt continue with ST, however pt and spouse may request d/c next week due to Pt/OT d/c.    Speech Therapy Frequency 2x / week   Treatment/Interventions Internal/external aids;SLP instruction and feedback;Language facilitation;Compensatory techniques;Environmental controls;Multimodal communcation approach;Cueing hierarchy;Functional tasks;Patient/family education   Potential to Achieve Goals Good   Potential Considerations Severity of impairments   Consulted and Agree with Plan of Care Patient;Family member/caregiver      Patient will benefit from skilled therapeutic intervention in order to improve the following deficits and impairments:   Dysarthria and anarthria  Aphasia    Problem  List Patient Active Problem List   Diagnosis Date Noted  . Encounter for therapeutic drug monitoring 01/05/2016  . Paroxysmal atrial fibrillation ([place]) 12/24/2015  . Cerebral infarction due to embolism of left middle cerebral artery ([place]) 06/02/2015  . HLD (hyperlipidemia) 06/02/2015  . PD (Parkinson's disease) ([place]) 06/02/2015  . Mitral regurgitation 03/17/2015  . CVA (cerebral infarction)   . TIA (transient ischemic attack) 03/05/2015  . Slurred speech 03/05/2015  . Anxiety 12/19/2013  . Parkinson's disease ([place]) 08/06/2013  . Dyslipidemia 08/06/2013  . S/P mitral valve repair 08/06/2013    Amarachukwu Lakatos, Annye Rusk MS, CCC-SLP 07/19/2017, 11:17 AM  [facility] [address] [place], [place], [address] Phone: [phone]   Fax:  [phone]   Name: [name] [name] MRN: [id number] Date of Birth: 01/15/1932

## 2017-07-19 NOTE — Therapy (Signed)
[facility] [address] [place], [place], [address] Phone: [phone]   Fax:  [phone]  Occupational Therapy Treatment  Patient Details  Name: [name] [name] MRN: [id number] Date of Birth: [birth year] Referring Provider: Dr.Siddiqui  Encounter Date: 07/19/2017      OT End of Session - 07/19/17 1329    Visit Number 5   Number of Visits 17   Date for OT Re-Evaluation 08/27/17   Authorization Type [employer] MCR   Authorization - Visit Number 5   Authorization - Number of Visits 10   OT Start Time 0935   OT Stop Time 1015   OT Time Calculation (min) 40 min   Activity Tolerance Patient tolerated treatment well   Behavior During Therapy [facility] for tasks assessed/performed      Past Medical History:  Diagnosis Date  . Dyslipidemia   . Parkinson's disease (HCC)   . S/P mitral valve replacement    mitral regurg, endocarditis  . Stroke [facility])     Past Surgical History:  Procedure Laterality Date  . CATARACT EXTRACTION  2009, 2012   right 2009, left 2012  . LOOP RECORDER IMPLANT Left 03/13/2015   Procedure: LOOP RECORDER IMPLANT;  Surgeon: Marinus Maw, MD;  Location: [facility] CATH LAB;  Service: Cardiovascular;  Laterality: Left;  . 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 TEE WITHOUT CARDIOVERSION N/A 03/13/2015   Procedure: TRANSESOPHAGEAL ECHOCARDIOGRAM (TEE);  Surgeon: Laurey Morale, MD;  Location: [facility] ENDOSCOPY;  Service: Cardiovascular;  Laterality: N/A;  . TRANSTHORACIC ECHOCARDIOGRAM  08/15/2012   EF=>55%; normal LV systolic function; RV mildly dilated & systolic function mildly reduced; RA mod dilated; mild -mod MR & mildy increased gradients; mild TR; AV mildly sclerotic with mild-mod regurg    There were no vitals filed for this visit.        Treatment: reviewed PWR! step with hands, then picking up and stacking coins with  big / deliberate motions Reviewed donning doffing jacket and shirt with adapted strategy, pt practiced fastening buttons on tabletop then while wearing shirt. Pt demonstrates improved performance with repetition.                      OT Short Term Goals - 07/19/17 1332      OT SHORT TERM GOAL #1   Title I with PD specific HEP   Time 4   Period Weeks   Status Achieved     OT SHORT TERM GOAL #2   Title Pt will demonstrate improved fine motor coordination for ADLs as evidenced by decreasing RUE  9 hole peg test score to 40 secs or less.   Time 4   Period Weeks   Status On-going     OT SHORT TERM GOAL #3   Title Pt will demonstrate increased ease with dressing as evidenced by decreasing PPT#4 to 29 secs or less.   Time 4   Period Weeks   Status On-going           OT Long Term Goals - 07/19/17 1332      OT LONG TERM GOAL #1   Title Pt will verbalize understanding of strategies/AE to assist with ADLs/IADLs prn.--   Time 8   Period Weeks   Status New     OT LONG TERM GOAL #2   Title Pt / wife will verbalize understanding of compensatroy  strategies for short term memory deficits and ways to keep thinking skills sharp.   Time 8   Period Weeks   Status New     OT LONG TERM GOAL #3   Title Pt will report increased eased with reaching behind back to tuck in shirt, and wash back.   Time 8   Period Weeks   Status New     OT LONG TERM GOAL #4   Title Pt will verbalize understanding of ways to prevent future PD related complications and verbalize understanding of community resources.   Time 8   Period Weeks   Status New     OT LONG TERM GOAL #5   Title --------------------------------------               Plan - 07/19/17 1330    Clinical Impression Statement Pt is progressing towards goals. He demonstrates improved carryover of adapted strategies for ADLs following reinforcement.   Rehab Potential Good   Current Impairments/barriers affecting  progress: cognitive deficits   OT Frequency 2x / week   OT Duration 8 weeks   OT Treatment/Interventions Self-care/ADL training;Moist Heat;Fluidtherapy;DME and/or AE instruction;Splinting;Patient/family education;Balance training;Therapeutic exercises;Ultrasound;Therapeutic exercise;Therapeutic activities;Cognitive remediation/compensation;Passive range of motion;Functional Mobility Training;Neuromuscular education;Energy conservation;Manual Therapy;Visual/perceptual remediation/compensation   Plan check goals, anticipate d/c in next 1-2 visits, memory strategies, ways to prevent future complicationss, community resources.   OT Home Exercise Plan Education provided:  PWR hands (basic 4), coordination HEP; ADLs with large movements   Consulted and Agree with Plan of Care Patient;Family member/caregiver   Family Member Consulted wife      Patient will benefit from skilled therapeutic intervention in order to improve the following deficits and impairments:  Abnormal gait, Decreased coordination, Decreased range of motion, Difficulty walking, Impaired flexibility, Decreased safety awareness, Decreased endurance, Decreased activity tolerance, Decreased knowledge of precautions, Impaired tone, Impaired UE functional use, Pain, Decreased balance, Decreased cognition, Decreased mobility, Decreased strength, Impaired vision/preception  Visit Diagnosis: Other lack of coordination  Other symptoms and signs involving the nervous system  Other symptoms and signs involving the musculoskeletal system  Attention and concentration deficit    Problem List Patient Active Problem List   Diagnosis Date Noted  . Encounter for therapeutic drug monitoring 01/05/2016  . Paroxysmal atrial fibrillation (HCC) 12/24/2015  . 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 HLD (hyperlipidemia) 06/02/2015  . PD (Parkinson's disease) (HCC) 06/02/2015  . Mitral regurgitation  03/17/2015  . CVA (cerebral infarction)   . TIA (transient ischemic attack) 03/05/2015  . Slurred speech 03/05/2015  . Anxiety 12/19/2013  . Parkinson's disease (HCC) 08/06/2013  . Dyslipidemia 08/06/2013  . S/P mitral valve repair 08/06/2013    Mechel Haggard 07/19/2017, 1:34 PM  [facility] [address] [place], [place], [address] Phone: [phone]   Fax:  [phone]  Name: [name] [name] MRN: [id number] Date of Birth: [birth year]

## 2017-07-26 ENCOUNTER — Ambulatory Visit (INDEPENDENT_AMBULATORY_CARE_PROVIDER_SITE_OTHER): Payer: Medicare Other | Admitting: *Deleted

## 2017-07-26 ENCOUNTER — Ambulatory Visit: Payer: Medicare Other | Admitting: Speech Pathology

## 2017-07-26 ENCOUNTER — Ambulatory Visit: Payer: Medicare Other | Attending: Internal Medicine | Admitting: Occupational Therapy

## 2017-07-26 DIAGNOSIS — R471 Dysarthria and anarthria: Secondary | ICD-10-CM | POA: Diagnosis present

## 2017-07-26 DIAGNOSIS — R29818 Other symptoms and signs involving the nervous system: Secondary | ICD-10-CM | POA: Diagnosis present

## 2017-07-26 DIAGNOSIS — R278 Other lack of coordination: Secondary | ICD-10-CM | POA: Insufficient documentation

## 2017-07-26 DIAGNOSIS — R29898 Other symptoms and signs involving the musculoskeletal system: Secondary | ICD-10-CM | POA: Diagnosis present

## 2017-07-26 DIAGNOSIS — I63412 Cerebral infarction due to embolism of left middle cerebral artery: Secondary | ICD-10-CM

## 2017-07-26 DIAGNOSIS — R2689 Other abnormalities of gait and mobility: Secondary | ICD-10-CM | POA: Insufficient documentation

## 2017-07-26 DIAGNOSIS — R4701 Aphasia: Secondary | ICD-10-CM | POA: Diagnosis present

## 2017-07-26 DIAGNOSIS — Z9889 Other specified postprocedural states: Secondary | ICD-10-CM | POA: Diagnosis not present

## 2017-07-26 DIAGNOSIS — R41842 Visuospatial deficit: Secondary | ICD-10-CM | POA: Insufficient documentation

## 2017-07-26 DIAGNOSIS — R293 Abnormal posture: Secondary | ICD-10-CM | POA: Insufficient documentation

## 2017-07-26 DIAGNOSIS — Z5181 Encounter for therapeutic drug level monitoring: Secondary | ICD-10-CM

## 2017-07-26 DIAGNOSIS — R41840 Attention and concentration deficit: Secondary | ICD-10-CM | POA: Diagnosis present

## 2017-07-26 DIAGNOSIS — I480 Paroxysmal atrial fibrillation: Secondary | ICD-10-CM

## 2017-07-26 DIAGNOSIS — R2681 Unsteadiness on feet: Secondary | ICD-10-CM | POA: Insufficient documentation

## 2017-07-26 DIAGNOSIS — G458 Other transient cerebral ischemic attacks and related syndromes: Secondary | ICD-10-CM | POA: Diagnosis not present

## 2017-07-26 LAB — POCT INR: INR: 2.6

## 2017-07-26 NOTE — Patient Instructions (Signed)
    Get the persons attention before you speak  Use eye contact and face the person you are speaking to  Be in close proximity to the person you are speaking to  Turn down any noise in the environment such as the TV, walk away from loud appliances, air conditioners, fans, dish washers etc  Be in the same room before you start to talk to the Mrs.   No radio if you are talking in the car

## 2017-07-26 NOTE — Therapy (Signed)
[facility] [address] [place], [place], [address] Phone: [phone]   Fax:  [phone]  Speech Language Pathology Treatment  Patient Details  Name: [name] [name] MRN: [id number] Date of Birth: 07/22/1932 Referring Provider: Jaquita FoldsSiddiqui, Mustafa, MD  Encounter Date: 07/26/2017      End of Session - 07/26/17 1200    Visit Number 6   Number of Visits 17   Date for SLP Re-Evaluation 09/16/17   SLP Start Time 1103   SLP Stop Time  1145   SLP Time Calculation (min) 42 min   Activity Tolerance Patient tolerated treatment well      Past Medical History:  Diagnosis Date  . Dyslipidemia   . Parkinson's disease (HCC)   . S/P mitral valve replacement    mitral regurg, endocarditis  . Stroke [facility](HCC)     Past Surgical History:  Procedure Laterality Date  . CATARACT EXTRACTION  2009, 2012   right 2009, left 2012  . LOOP RECORDER IMPLANT Left 03/13/2015   Procedure: LOOP RECORDER IMPLANT;  Surgeon: Marinus MawGregg W Taylor, MD;  [facility];  Service: Cardiovascular;  Laterality: Left;  . 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 TEE WITHOUT CARDIOVERSION N/A 03/13/2015   Procedure: TRANSESOPHAGEAL ECHOCARDIOGRAM (TEE);  Surgeon: Laurey Moralealton S McLean, MD;  [facility];  Service: Cardiovascular;  Laterality: N/A;  . TRANSTHORACIC ECHOCARDIOGRAM  08/15/2012   EF=>55%; normal LV systolic function; RV mildly dilated & systolic function mildly reduced; RA mod dilated; mild -mod MR & mildy increased gradients; mild TR; AV mildly sclerotic with mild-mod regurg    There were no vitals filed for this visit.      Subjective Assessment - 07/26/17 1119    Subjective "               ADULT SLP TREATMENT - 07/26/17 1133      General Information   Behavior/Cognition Alert;Cooperative;Pleasant mood     Treatment Provided   Treatment  provided Cognitive-Linquistic     Pain Assessment   Pain Assessment No/denies pain     Cognitive-Linquistic Treatment   Treatment focused on Dysarthria   Skilled Treatment Loud /a/ to recalibrate volume average of 92dB with rare min A.  Structured speech tasks with  average of 72dB with rare min A. Moderately complex conversation over 15 minutes pt maintained an average of 70dB with occasional min visual and verbal cues.      Assessment / Recommendations / Plan   Plan Continue with current plan of care     Progression Toward Goals   Progression toward goals Progressing toward goals          SLP Education - 07/26/17 1155    Education provided Yes   Education Details environmental compensations for reduced volume when pt with fatigue   Person(s) Educated Patient;Spouse   Methods Explanation;Demonstration;Handout   Comprehension Verbalized understanding;Returned demonstration          SLP Short Term Goals - 07/26/17 1158      SLP SHORT TERM GOAL #1   Title pt will maintain loud /a/ average 95dB x 4 sessions   Baseline 07/19/17; 07/26/17   Time 2   Period Weeks   Status On-going     SLP SHORT TERM GOAL #2   Title pt will demo 18/20 sentences with average low 70s dB x2 sessions   Baseline 07/19/17, 07/26/17  Time 1   Period Weeks   Status On-going     SLP SHORT TERM GOAL #3   Title pt will demo abdominal breatihng at rest 70% of the time over 3 minutes   Time 2   Period Weeks   Status Achieved     SLP SHORT TERM GOAL #4   Title pt will complete simple naming tasks with 85% success over 3 sessions   Time 1   Period Weeks   Status On-going          SLP Long Term Goals - 07/26/17 1159      SLP LONG TERM GOAL #1   Title pt will demo loudness average low 70s dB in 10 minutes simple-mod complex conversation with rare nonverbal cues x3 sessions   Time 5   Period Weeks   Status On-going     SLP LONG TERM GOAL #2   Title pt will demo abdominal breathing for 50% of  a simple 8 minute conversation x 2 sessions   Time 5   Period Weeks   Status On-going     SLP LONG TERM GOAL #3   Title pt will tell SLP 3 s/s of oropharyngeal dysphagia with modified independence   Time 5   Period Weeks   Status On-going     SLP LONG TERM GOAL #4   Title pt will exhibit WFL word finding using compensations, in 10 minutes simple-mod complex conversation   Time 5   Period Weeks   Status On-going          Plan - 07/26/17 1156    Clinical Impression Statement Pt with improvement today utilizing breath support in structured tasks and conversation to maintain average of 70dB with occasional min A. Spouse reports difficulty with generalizing breath support and volume outside of therapy. Continue skilled ST to maximize intellgibility and carryover of strategies.    Speech Therapy Frequency 2x / week   Treatment/Interventions Internal/external aids;SLP instruction and feedback;Language facilitation;Compensatory techniques;Environmental controls;Multimodal communcation approach;Cueing hierarchy;Functional tasks;Patient/family education   Potential to Achieve Goals Good   Potential Considerations Severity of impairments   Consulted and Agree with Plan of Care Patient;Family member/caregiver      Patient will benefit from skilled therapeutic intervention in order to improve the following deficits and impairments:   Dysarthria and anarthria  Aphasia    Problem List Patient Active Problem List   Diagnosis Date Noted  . Encounter for therapeutic drug monitoring 01/05/2016  . Paroxysmal atrial fibrillation (HCC) 12/24/2015  . 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 HLD (hyperlipidemia) 06/02/2015  . PD (Parkinson's disease) (HCC) 06/02/2015  . Mitral regurgitation 03/17/2015  . CVA (cerebral infarction)   . TIA (transient ischemic attack) 03/05/2015  . Slurred speech 03/05/2015  . Anxiety 12/19/2013  . Parkinson's  disease (HCC) 08/06/2013  . Dyslipidemia 08/06/2013  . S/P mitral valve repair 08/06/2013    Emmalyn Hinson, Radene Journey MS, CCC-SLP 07/26/2017, 12:00 PM  [facility] [address] [place], [place], [address] Phone: [phone]   Fax:  [phone]   Name: [name] [name] MRN: [id number] Date of Birth: December 09, 1931

## 2017-07-26 NOTE — Patient Instructions (Signed)
Ways to prevent future Parkinson's related complications: 1.   Exercise regularly,  2.   Focus on BIGGER movements during daily activities- really reach overhead, straighten elbows and extend fingers 3.   When dressing or reaching for your seatbelt make sure to use your body to assist by twisting while you reach-this can help to minimize stress on the shoulder and reduce the risk of a rotator cuff tear 4.   Swing your arms when you walk! People with PD are at increased risk for frozen shoulder and swinging your arms can reduce this risk.     Memory Compensation Strategies  1. Use "WARM" strategy.  W= write it down  A= associate it  R= repeat it  M= make a mental note  2.   You can keep a Glass blower/designerMemory Notebook.  Use a 3-ring notebook with sections for the following: calendar, important names and phone numbers,  medications, doctors' names/phone numbers, lists/reminders, and a section to journal what you did  each day.   3.    Use a calendar to write appointments down.  4.    Write yourself a schedule for the day.  This can be placed on the calendar or in a separate section of the Memory Notebook.  Keeping a  regular schedule can help memory.  5.    Use medication organizer with sections for each day or morning/evening pills.  You may need help loading it  6.    Keep a basket, or pegboard by the door.  Place items that you need to take out with you in the basket or on the pegboard.  You may also want to  include a message board for reminders.  7.    Use sticky notes.  Place sticky notes with reminders in a place where the task is performed.  For example: " turn off the  stove" placed by the stove, "lock the door" placed on the door at eye level, " take your medications" on  the bathroom mirror or by the place where you normally take your medications.  8.    Use alarms/timers.  Use while cooking to remind yourself to check on food or as a reminder to take your medicine, or as a  reminder  to make a call, or as a reminder to perform another task, etc.

## 2017-07-26 NOTE — Therapy (Signed)
[facility] [address] [place], [place], [address] Phone: [phone]   Fax:  [phone]  Occupational Therapy Treatment  Patient Details  Name: [name] [name] MRN: [id number] Date of Birth: 04/20/1932 Referring Provider: Dr.Siddiqui  Encounter Date: 07/26/2017      OT End of Session - 07/26/17 1026    Visit Number 6   Number of Visits 17   Date for OT Re-Evaluation 08/27/17   Authorization Type [employer] MCR   Authorization - Visit Number 6   Authorization - Number of Visits 10   OT Start Time 0936   OT Stop Time 1015   OT Time Calculation (min) 39 min      Past Medical History:  Diagnosis Date  . Dyslipidemia   . Parkinson's disease (HCC)   . S/P mitral valve replacement    mitral regurg, endocarditis  . Stroke [facility](HCC)     Past Surgical History:  Procedure Laterality Date  . CATARACT EXTRACTION  2009, 2012   right 2009, left 2012  . LOOP RECORDER IMPLANT Left 03/13/2015   Procedure: LOOP RECORDER IMPLANT;  Surgeon: Marinus MawGregg W Taylor, MD;  [facility];  Service: Cardiovascular;  Laterality: Left;  . 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 TEE WITHOUT CARDIOVERSION N/A 03/13/2015   Procedure: TRANSESOPHAGEAL ECHOCARDIOGRAM (TEE);  Surgeon: Laurey Moralealton S McLean, MD;  [facility];  Service: Cardiovascular;  Laterality: N/A;  . TRANSTHORACIC ECHOCARDIOGRAM  08/15/2012   EF=>55%; normal LV systolic function; RV mildly dilated & systolic function mildly reduced; RA mod dilated; mild -mod MR & mildy increased gradients; mild TR; AV mildly sclerotic with mild-mod regurg    There were no vitals filed for this visit.      Subjective Assessment - 07/26/17 1141    Subjective  No reports of pain   Pertinent History PD, hx of CVA mitral valve replacement, dyslipidemia, a-fib   Patient Stated Goals maintain independence   Currently in Pain? No/denies             Therapist started checking progress towards short and long term goals. Pt/ and wife would like an OT eval in 6 mons.                 OT Education - 07/26/17 1142    Education provided Yes   Education Details education regarding short term memory compensations, community resources and ways to prevent future PD related complications, adapted strategies for drying back, bag exercise for simulated tuck in shirt, PWR! hands review, basic 4   Person(s) Educated Patient;Spouse   Methods Explanation;Demonstration;Verbal cues;Handout   Comprehension Verbalized understanding;Returned demonstration;Verbal cues required          OT Short Term Goals - 07/26/17 1030      OT SHORT TERM GOAL #1   Title I with PD specific HEP   Time 4   Period Weeks   Status Achieved     OT SHORT TERM GOAL #2   Title Pt will demonstrate improved fine motor coordination for ADLs as evidenced by decreasing RUE  9 hole peg test score to 40 secs or less.   Time 4   Period Weeks   Status Achieved  38.56 secs     OT SHORT TERM GOAL #3   Title Pt will demonstrate increased ease with dressing as evidenced by decreasing PPT#4 to 29 secs or less.  Baseline 32.55 secs   Time 4   Period Weeks   Status Achieved  9.69 secs           OT Long Term Goals - 07/26/17 1032      OT LONG TERM GOAL #1   Title Pt will verbalize understanding of strategies/AE to assist with ADLs/IADLs prn.--   Time 8   Period Weeks   Status On-going  needs reinforcement     OT LONG TERM GOAL #2   Title Pt / wife will verbalize understanding of compensatory strategies for short term memory deficits and ways to keep thinking skills sharp.   Time 8   Period Weeks   Status On-going  educated in compensatory strategies for short term memory, needs keeping thinking skills sharp     OT LONG TERM GOAL #3   Title Pt will report increased eased with reaching behind back to tuck  in shirt, and wash back.   Time 8   Period Weeks   Status On-going     OT LONG TERM GOAL #4   Title Pt will verbalize understanding of ways to prevent future PD related complications and verbalize understanding of community resources.   Time 8   Period Weeks   Status Achieved               Plan - 07/26/17 1138    Clinical Impression Statement Pt is progressing towards goals. Pt and wife would like to wrap up in the next several visits. Pt/ wife would like to schedule an evaluation with OT in 6 mons from d/c.   Rehab Potential Good   Current Impairments/barriers affecting progress: cognitive deficits   OT Frequency 2x / week   OT Duration 8 weeks   OT Treatment/Interventions Self-care/ADL training;Moist Heat;Fluidtherapy;DME and/or AE instruction;Splinting;Patient/family education;Balance training;Therapeutic exercises;Ultrasound;Therapeutic exercise;Therapeutic activities;Cognitive remediation/compensation;Passive range of motion;Functional Mobility Training;Neuromuscular education;Energy conservation;Manual Therapy;Visual/perceptual remediation/compensation   Plan educate in adapted strategies for walking while carrying groceries/ box,  washing back, tucking in shirt , provide keeping thinking skills sharp- if d/c schedule OT eval in 6 mons   OT Home Exercise Plan Education provided:  PWR hands (basic 4), coordination HEP; ADLs with large movements   Consulted and Agree with Plan of Care Patient;Family member/caregiver   Family Member Consulted wife      Patient will benefit from skilled therapeutic intervention in order to improve the following deficits and impairments:  Abnormal gait, Decreased coordination, Decreased range of motion, Difficulty walking, Impaired flexibility, Decreased safety awareness, Decreased endurance, Decreased activity tolerance, Decreased knowledge of precautions, Impaired tone, Impaired UE functional use, Pain, Decreased balance, Decreased cognition,  Decreased mobility, Decreased strength, Impaired vision/preception  Visit Diagnosis: Other lack of coordination  Other symptoms and signs involving the nervous system  Other symptoms and signs involving the musculoskeletal system  Attention and concentration deficit  Abnormal posture    Problem List Patient Active Problem List   Diagnosis Date Noted  . Encounter for therapeutic drug monitoring 01/05/2016  . Paroxysmal atrial fibrillation (HCC) 12/24/2015  . 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 HLD (hyperlipidemia) 06/02/2015  . PD (Parkinson's disease) (HCC) 06/02/2015  . Mitral regurgitation 03/17/2015  . CVA (cerebral infarction)   . TIA (transient ischemic attack) 03/05/2015  . Slurred speech 03/05/2015  . Anxiety 12/19/2013  . Parkinson's disease (HCC) 08/06/2013  . Dyslipidemia 08/06/2013  . S/P mitral valve repair 08/06/2013    Shown Dissinger 07/26/2017, 11:47 AM Keene Breath, OTR/L [phone] Phone: [phone] 11:47 AM 07/26/17 [facility] [address] [place], [place], [address] Phone: [phone]   Fax:  [phone]  Name: [name] [name] MRN: [id number] Date of Birth: 1932/03/28

## 2017-07-28 ENCOUNTER — Encounter: Payer: Medicare Other | Admitting: Occupational Therapy

## 2017-07-28 ENCOUNTER — Encounter: Payer: Medicare Other | Admitting: Speech Pathology

## 2017-08-01 ENCOUNTER — Ambulatory Visit (INDEPENDENT_AMBULATORY_CARE_PROVIDER_SITE_OTHER): Payer: Medicare Other | Admitting: *Deleted

## 2017-08-01 ENCOUNTER — Ambulatory Visit: Payer: Medicare Other

## 2017-08-01 ENCOUNTER — Ambulatory Visit: Payer: Medicare Other | Admitting: Occupational Therapy

## 2017-08-01 DIAGNOSIS — I63412 Cerebral infarction due to embolism of left middle cerebral artery: Secondary | ICD-10-CM

## 2017-08-01 DIAGNOSIS — R41842 Visuospatial deficit: Secondary | ICD-10-CM

## 2017-08-01 DIAGNOSIS — R41840 Attention and concentration deficit: Secondary | ICD-10-CM

## 2017-08-01 DIAGNOSIS — R278 Other lack of coordination: Secondary | ICD-10-CM | POA: Diagnosis not present

## 2017-08-01 DIAGNOSIS — R29898 Other symptoms and signs involving the musculoskeletal system: Secondary | ICD-10-CM

## 2017-08-01 DIAGNOSIS — R29818 Other symptoms and signs involving the nervous system: Secondary | ICD-10-CM

## 2017-08-01 DIAGNOSIS — R2681 Unsteadiness on feet: Secondary | ICD-10-CM

## 2017-08-01 DIAGNOSIS — R4701 Aphasia: Secondary | ICD-10-CM

## 2017-08-01 DIAGNOSIS — R293 Abnormal posture: Secondary | ICD-10-CM

## 2017-08-01 DIAGNOSIS — R471 Dysarthria and anarthria: Secondary | ICD-10-CM

## 2017-08-01 DIAGNOSIS — R2689 Other abnormalities of gait and mobility: Secondary | ICD-10-CM

## 2017-08-01 NOTE — Therapy (Signed)
[place] [address] [place], [place], [address] Phone: [phone]   Fax:  [phone]  Speech Language Pathology Treatment  Patient Details  Name: [name] [name] MRN: [id number] Date of Birth: Mar 30, 1932 Referring Provider: Eldridge Abrahams, MD  Encounter Date: 08/01/2017      End of Session - 08/01/17 0935    Visit Number 7   Number of Visits 17   Date for SLP Re-Evaluation 09/16/17   SLP Start Time 0850   SLP Stop Time  0932   SLP Time Calculation (min) 42 min   Activity Tolerance Patient tolerated treatment well      Past Medical History:  Diagnosis Date  . Dyslipidemia   . Parkinson's disease ([place])   . S/P mitral valve replacement    mitral regurg, endocarditis  . Stroke [facility])     Past Surgical History:  Procedure Laterality Date  . CATARACT EXTRACTION  2009, 2012   right 2009, left 2012  . LOOP RECORDER IMPLANT Left 03/13/2015   Procedure: LOOP RECORDER IMPLANT;  Surgeon: Evans Lance, MD;  Location: [facility] C[facility] LAB;  Service: Cardiovascular;  Laterality: Left;  . MITRAL VALVE ANNULOPLASTY  04/10/2007    43m Edwards ring; r/t h/o MR and flail mitral leaflets due to endocarditis: MRI safe  . TEE WITHOUT CARDIOVERSION N/A 03/13/2015   Procedure: TRANSESOPHAGEAL ECHOCARDIOGRAM (TEE);  Surgeon: DLarey Dresser MD;  [place]  Service: Cardiovascular;  Laterality: N/A;  . TRANSTHORACIC ECHOCARDIOGRAM  08/15/2012   EF=>55%; normal LV systolic function; RV mildly dilated & systolic function mildly reduced; RA mod dilated; mild -mod MR & mildy increased gradients; mild TR; AV mildly sclerotic with mild-mod regurg    There were no vitals filed for this visit.      Subjective Assessment - 08/01/17 0855    Subjective "We filled out the forms you gave [place] (homework)" "I got stalled several times (with divergent naming tasks)."               ADULT SLP TREATMENT - 08/01/17 0857      General Information   Behavior/Cognition Alert;Cooperative;Pleasant mood     Treatment Provided   Treatment provided Cognitive-Linquistic;Dysphagia     Dysphagia Treatment   Other treatment/comments SLP took 6-8 minutes to educate pt about overt s/s of aspiration and described each.     Cognitive-Linquistic Treatment   Treatment focused on Dysarthria   Skilled Treatment Loud /a/ to recalibrate pt's conversational volume average in upper 80s dB with occasional min-mod A.  Structured speech tasks were measured by SLP in the low 70s dB. Moderately complex conversation over 15 minutes pt maintained an average in the low 70s dB with SBA.  Outdoors, pt maintained louder speech for 10 minute mod complex covnersation.      Assessment / Recommendations / Plan   Plan Continue with current plan of care     Progression Toward Goals   Progression toward goals Progressing toward goals          SLP Education - 08/01/17 0907    Education provided Yes   Education Details overt s/s aspiration and possible need for objective swallow eval   Person(s) Educated Patient   Methods Explanation;Handout   Comprehension Verbalized understanding;Need further instruction          SLP Short Term Goals - 08/01/17 0858      SLP SHORT TERM GOAL #1   Title pt will maintain loud /a/ average 95dB x 4 sessions  Status Partially Met  2 sessions     SLP SHORT TERM GOAL #2   Title pt will demo 18/20 sentences with average low 70s dB x2 sessions   Status Achieved     SLP SHORT TERM GOAL #3   Title pt will demo abdominal breatihng at rest 70% of the time over 3 minutes   Status Achieved     SLP SHORT TERM GOAL #4   Title pt will complete simple naming tasks with 85% success over 3 sessions   Time 1   Period Weeks   Status On-going          SLP Long Term Goals - 08/01/17 2952      SLP LONG TERM GOAL #1   Title pt will demo loudness average low 70s dB in 10 minutes simple-mod complex conversation  with rare nonverbal cues x3 sessions   Time 4   Period Weeks   Status On-going     SLP LONG TERM GOAL #2   Title pt will demo abdominal breathing for 50% of a simple 8 minute conversation x 2 sessions   Time 4   Period Weeks   Status On-going     SLP LONG TERM GOAL #3   Title pt will tell SLP 3 s/s of oropharyngeal dysphagia with modified independence   Time 4   Period Weeks   Status On-going     SLP LONG TERM GOAL #4   Title pt will exhibit WFL word finding using compensations, in 10 minutes simple-mod complex conversation   Time 4   Period Weeks   Status On-going          Plan - 08/01/17 0936    Clinical Impression Statement Pt with cont'd improvement today utilizing breath support in conversation to maintain average of low 70s dB. Even outside of ST room, pt maintined strong voice while walking and engaging in mod complex conversation. Rec to take pt outside ST room to attempt to generalize/carryover ST techniques Continue skilled ST to maximize intellgibility and carryover of strategies.       Patient will benefit from skilled therapeutic intervention in order to improve the following deficits and impairments:   Dysarthria and anarthria  Aphasia    Problem List Patient Active Problem List   Diagnosis Date Noted  . Encounter for therapeutic drug monitoring 01/05/2016  . Paroxysmal atrial fibrillation ([place]) 12/24/2015  . Cerebral infarction due to embolism of left middle cerebral artery ([place]) 06/02/2015  . HLD (hyperlipidemia) 06/02/2015  . PD (Parkinson's disease) ([place]) 06/02/2015  . Mitral regurgitation 03/17/2015  . CVA (cerebral infarction)   . TIA (transient ischemic attack) 03/05/2015  . Slurred speech 03/05/2015  . Anxiety 12/19/2013  . Parkinson's disease ([place]) 08/06/2013  . Dyslipidemia 08/06/2013  . S/P mitral valve repair 08/06/2013    [facility] ,MS, CCC-SLP  08/01/2017, 12:58 PM  [place] [address] [place], [place], [address] Phone: [phone]   Fax:  [phone]   Name: [name] [name] MRN: [id number] Date of Birth: 06-10-1932

## 2017-08-01 NOTE — Progress Notes (Signed)
Carelink Summary Report / Loop Recorder 

## 2017-08-01 NOTE — Therapy (Signed)
[facility] [address] [place], [place], [address] Phone: [phone]   Fax:  [phone]  Occupational Therapy Treatment  Patient Details  Name: [name] [name] MRN: [id number] Date of Birth: 02/07/1932 Referring Provider: Dr.Siddiqui  Encounter Date: 08/01/2017      OT End of Session - 08/01/17 0808    Visit Number 7   Number of Visits 17   Date for OT Re-Evaluation 08/27/17   Authorization Type UHC MCR   Authorization - Visit Number 7   Authorization - Number of Visits 10   OT Start Time 0807   OT Stop Time 0848   OT Time Calculation (min) 41 min   Activity Tolerance Patient tolerated treatment well   Behavior During Therapy [facility] for tasks assessed/performed      Past Medical History:  Diagnosis Date  . Dyslipidemia   . Parkinson's disease (HCC)   . S/P mitral valve replacement    mitral regurg, endocarditis  . Stroke [facility])     Past Surgical History:  Procedure Laterality Date  . CATARACT EXTRACTION  2009, 2012   right 2009, left 2012  . LOOP RECORDER IMPLANT Left 03/13/2015   Procedure: LOOP RECORDER IMPLANT;  Surgeon: Marinus Maw, MD;  Location: [facility] CATH LAB;  Service: Cardiovascular;  Laterality: Left;  . 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 TEE WITHOUT CARDIOVERSION N/A 03/13/2015   Procedure: TRANSESOPHAGEAL ECHOCARDIOGRAM (TEE);  Surgeon: Laurey Morale, MD;  Location: [facility] ENDOSCOPY;  Service: Cardiovascular;  Laterality: N/A;  . TRANSTHORACIC ECHOCARDIOGRAM  08/15/2012   EF=>55%; normal LV systolic function; RV mildly dilated & systolic function mildly reduced; RA mod dilated; mild -mod MR & mildy increased gradients; mild TR; AV mildly sclerotic with mild-mod regurg    There were no vitals filed for this visit.      Subjective Assessment - 08/01/17 0808    Subjective  Wife reports that pt has really  benefited from OT this time   Pertinent History PD, hx of CVA mitral valve replacement, dyslipidemia, a-fib   Patient Stated Goals maintain independence   Currently in Pain? No/denies      Reviewed/educated pt/wife in how brain is affected in PD and how medication/therapy helps to treat per pt questions.  Pt/wife instructed in strategies for donning pull-over shirt/pulling shirt down in back and tucking in shirt.  Pt/wife verbalized understanding.  Reviewed strategies for sit>stand and practiced sit>stand from table/simulated church pew with min-mod cueing.    Reviewed importance of PWR! Moves and large amplitude movement strategies to prevent future complications and reviewed research/rationale behind follow-up screens/re-evals per pt/wife questions.    Reviewed LB dressing strategies and reinforced importance of sitting for LB dressing due to fall risk.  Educated pt in importance of posture when eating to prevent swallowing complications and how dyskinesias and decr awareness may be affecting.  Recommended ways to cue and that pt scoot all the way up to the table.  Pt/wife educated in how PD affects cognition and importance/need for caregiver cueing for ADL strategies.  Also recommended that pt/wife come up with system and discuss how pt would like to hear feedback/cueing.  Recommended that wife cue prior to difficulty with known problem situations.  Reassured pt that need for cueing is part of PD and not his fault.      Pt/wife verbalized understanding of all education provided/recommendations.  OT Short Term Goals - 07/26/17 1030      OT SHORT TERM GOAL #1   Title I with PD specific HEP   Time 4   Period Weeks   Status Achieved     OT SHORT TERM GOAL #2   Title Pt will demonstrate improved fine motor coordination for ADLs as evidenced by decreasing RUE  9 hole peg test score to 40 secs or less.   Time 4   Period Weeks   Status Achieved   38.56 secs     OT SHORT TERM GOAL #3   Title Pt will demonstrate increased ease with dressing as evidenced by decreasing PPT#4 to 29 secs or less.   Baseline 32.55 secs   Time 4   Period Weeks   Status Achieved  9.69 secs           OT Long Term Goals - 07/26/17 1032      OT LONG TERM GOAL #1   Title Pt will verbalize understanding of strategies/AE to assist with ADLs/IADLs prn.--   Time 8   Period Weeks   Status On-going  needs reinforcement     OT LONG TERM GOAL #2   Title Pt / wife will verbalize understanding of compensatory strategies for short term memory deficits and ways to keep thinking skills sharp.   Time 8   Period Weeks   Status On-going  educated in compensatory strategies for short term memory, needs keeping thinking skills sharp     OT LONG TERM GOAL #3   Title Pt will report increased eased with reaching behind back to tuck in shirt, and wash back.   Time 8   Period Weeks   Status On-going     OT LONG TERM GOAL #4   Title Pt will verbalize understanding of ways to prevent future PD related complications and verbalize understanding of community resources.   Time 8   Period Weeks   Status Achieved               Plan - 08/01/17 0809    Clinical Impression Statement Pt is progressing towards goals.  However, cognitive deficits affect carryover.  Pt/wife instructed in use of cueing for strategies.   Rehab Potential Good   Current Impairments/barriers affecting progress: cognitive deficits   OT Frequency 2x / week   OT Duration 8 weeks   OT Treatment/Interventions Self-care/ADL training;Moist Heat;Fluidtherapy;DME and/or AE instruction;Splinting;Patient/family education;Balance training;Therapeutic exercises;Ultrasound;Therapeutic exercise;Therapeutic activities;Cognitive remediation/compensation;Passive range of motion;Functional Mobility Training;Neuromuscular education;Energy conservation;Manual Therapy;Visual/perceptual  remediation/compensation   Plan anticipate d/c next visit; educated in adaptive strategies for carrying groceries/box; provide keeping thinking skills sharp handout prn, schedule OT eval in approx 6-788months   OT Home Exercise Plan Education provided:  PWR hands (basic 4), coordination HEP; ADLs with large movements   Consulted and Agree with Plan of Care Patient;Family member/caregiver   Family Member Consulted wife      Patient will benefit from skilled therapeutic intervention in order to improve the following deficits and impairments:  Abnormal gait, Decreased coordination, Decreased range of motion, Difficulty walking, Impaired flexibility, Decreased safety awareness, Decreased endurance, Decreased activity tolerance, Decreased knowledge of precautions, Impaired tone, Impaired UE functional use, Pain, Decreased balance, Decreased cognition, Decreased mobility, Decreased strength, Impaired vision/preception  Visit Diagnosis: Other symptoms and signs involving the nervous system  Other symptoms and signs involving the musculoskeletal system  Attention and concentration deficit  Abnormal posture  Visuospatial deficit  Unsteadiness on feet  Other abnormalities of gait and mobility  Problem List Patient Active Problem List   Diagnosis Date Noted  . Encounter for therapeutic drug monitoring 01/05/2016  . Paroxysmal atrial fibrillation (HCC) 12/24/2015  . 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 HLD (hyperlipidemia) 06/02/2015  . PD (Parkinson's disease) (HCC) 06/02/2015  . Mitral regurgitation 03/17/2015  . CVA (cerebral infarction)   . TIA (transient ischemic attack) 03/05/2015  . Slurred speech 03/05/2015  . Anxiety 12/19/2013  . Parkinson's disease (HCC) 08/06/2013  . Dyslipidemia 08/06/2013  . S/P mitral valve repair 08/06/2013    [facility] 08/01/2017, 10:08 AM  [facility] [address] [place], [place], [address] Phone: [phone]   Fax:  [phone]  Name: [name] [name] MRN: [id number] Date of Birth: 05/11/1932   Willa Frater, OTR/L [facility] [address] [place], [place]  [address] [phone] phone [phone] 08/01/17 10:08 AM

## 2017-08-01 NOTE — Patient Instructions (Signed)
   Indications that you might need a swallow test:  - coughing during food or liquid intake  - clearing your throat often during food or liquid intake  - "wet" or "gurgly" voice during the day, and/or during food or liquid intake

## 2017-08-03 LAB — CUP PACEART REMOTE DEVICE CHECK
Implantable Pulse Generator Implant Date: 20160421
MDC IDC SESS DTM: 20180909004129

## 2017-08-04 ENCOUNTER — Encounter: Payer: Medicare Other | Admitting: Occupational Therapy

## 2017-08-09 ENCOUNTER — Ambulatory Visit: Payer: Medicare Other

## 2017-08-09 ENCOUNTER — Ambulatory Visit: Payer: Medicare Other | Admitting: Occupational Therapy

## 2017-08-09 DIAGNOSIS — R278 Other lack of coordination: Secondary | ICD-10-CM | POA: Diagnosis not present

## 2017-08-09 DIAGNOSIS — R41842 Visuospatial deficit: Secondary | ICD-10-CM

## 2017-08-09 DIAGNOSIS — R293 Abnormal posture: Secondary | ICD-10-CM

## 2017-08-09 DIAGNOSIS — R2681 Unsteadiness on feet: Secondary | ICD-10-CM

## 2017-08-09 DIAGNOSIS — R471 Dysarthria and anarthria: Secondary | ICD-10-CM

## 2017-08-09 DIAGNOSIS — R4701 Aphasia: Secondary | ICD-10-CM

## 2017-08-09 DIAGNOSIS — R41840 Attention and concentration deficit: Secondary | ICD-10-CM

## 2017-08-09 DIAGNOSIS — R29898 Other symptoms and signs involving the musculoskeletal system: Secondary | ICD-10-CM

## 2017-08-09 DIAGNOSIS — R2689 Other abnormalities of gait and mobility: Secondary | ICD-10-CM

## 2017-08-09 DIAGNOSIS — R29818 Other symptoms and signs involving the nervous system: Secondary | ICD-10-CM

## 2017-08-09 NOTE — Patient Instructions (Signed)
Keeping Thinking Skills Sharp: 1. Jigsaw puzzles 2. Card/board games 3. Talking on the phone/social events 4. [email or web address] 5. Online games 6. Word serches/crossword puzzles 7.  Logic puzzles 8. Aerobic exercise (stationary bike) 9. Eating balanced diet (fruits & veggies) 10. Drink water 11. Try something new--new recipe, hobby 12. Crafts 13. Do a variety of activities that are challenging  

## 2017-08-10 NOTE — Patient Instructions (Signed)
  Please complete the assigned speech therapy homework prior to your next session and return it to the speech therapist at your next visit.  

## 2017-08-10 NOTE — Therapy (Signed)
[facility] [address] [place], [place], [address] Phone: [phone]   Fax:  [phone]  Speech Language Pathology Treatment  Patient Details  Name: [name] [name] MRN: [id number] Date of Birth: 1932/10/10 Referring Provider: Eldridge Abrahams, MD  Encounter Date: 08/09/2017      End of Session - 08/10/17 1526    Visit Number 8   Number of Visits 17   Date for SLP Re-Evaluation 09/16/17   SLP Start Time 0933   SLP Stop Time  4536   SLP Time Calculation (min) 42 min   Activity Tolerance Patient tolerated treatment well      Past Medical History:  Diagnosis Date  . Dyslipidemia   . Parkinson's disease ([place])   . S/P mitral valve replacement    mitral regurg, endocarditis  . Stroke [facility])     Past Surgical History:  Procedure Laterality Date  . CATARACT EXTRACTION  2009, 2012   right 2009, left 2012  . LOOP RECORDER IMPLANT Left 03/13/2015   Procedure: LOOP RECORDER IMPLANT;  Surgeon: Evans Lance, MD;  Location: [facility] CATH LAB;  Service: Cardiovascular;  Laterality: Left;  . MITRAL VALVE ANNULOPLASTY  04/10/2007    70m Edwards ring; r/t h/o MR and flail mitral leaflets due to endocarditis: MRI safe  . TEE WITHOUT CARDIOVERSION N/A 03/13/2015   Procedure: TRANSESOPHAGEAL ECHOCARDIOGRAM (TEE);  Surgeon: DLarey Dresser MD;  Location: [facility]  Service: Cardiovascular;  Laterality: N/A;  . TRANSTHORACIC ECHOCARDIOGRAM  08/15/2012   EF=>55%; normal LV systolic function; RV mildly dilated & systolic function mildly reduced; RA mod dilated; mild -mod MR & mildy increased gradients; mild TR; AV mildly sclerotic with mild-mod regurg    There were no vitals filed for this visit.      Subjective Assessment - 08/09/17 0943    Subjective Pt had difficulty recalling PT or OT (what therapy he just came from).   Patient is accompained by: --  wife   Currently in Pain? No/denies               ADULT SLP  TREATMENT - 08/10/17 0001      General Information   Behavior/Cognition Alert;Cooperative;Pleasant mood     Treatment Provided   Treatment provided Cognitive-Linquistic     Cognitive-Linquistic Treatment   Treatment focused on Dysarthria   Skilled Treatment Loud /a/ to recalibrate pt's conversational volume, average in lower 90s dB with occasional min A for loudness, consistent min-mod A for full breath.  Structured speech tasks were measured by SLP in the low 70s dB. Simple conversation over 7 minutes in the clinic pt maintained intelliglble speech 95-100% of the time.      Assessment / Recommendations / Plan   Plan Continue with current plan of care     Progression Toward Goals   Progression toward goals Progressing toward goals            SLP Short Term Goals - 08/10/17 1528      SLP SHORT TERM GOAL #1   Title pt will maintain loud /a/ average 95dB x 4 sessions   Status Partially Met  2 sessions     SLP SHORT TERM GOAL #2   Title pt will demo 18/20 sentences with average low 70s dB x2 sessions   Status Achieved     SLP SHORT TERM GOAL #3   Title pt will demo abdominal breatihng at rest 70% of the time over 3 minutes   Status Achieved  SLP SHORT TERM GOAL #4   Title pt will complete simple naming tasks with 85% success over 3 sessions   Status Deferred  pt without overt anomia in simple conversation           SLP Long Term Goals - 08/10/17 1529      SLP LONG TERM GOAL #1   Title pt will demo loudness average low 70s dB in 10 minutes simple-mod complex conversation with rare nonverbal cues x3 sessions   Time 3   Period Weeks   Status On-going     SLP LONG TERM GOAL #2   Title pt will demo abdominal breathing for 50% of a simple 8 minute conversation x 2 sessions   Time 3   Period Weeks   Status On-going     SLP LONG TERM GOAL #3   Title pt will tell SLP 3 s/s of oropharyngeal dysphagia with modified independence   Time 3   Period Weeks   Status  On-going     SLP LONG TERM GOAL #4   Title pt will exhibit WFL word finding using compensations, in 10 minutes simple-mod complex conversation   Time 3   Period Weeks   Status On-going          Plan - 08/10/17 1527    Clinical Impression Statement Pt with cont'd improvement today utilizing breath support in conversation to maintain average of low 70s dB. Outside of ST room, pt again maintined strong voice while walking and engaging in mod complex conversation. Rec pt be taken outside ST room to better generalize/carryover ST techniques. Pt likely ready for d/c in 3-6 visits. Continue skilled ST to maximize intellgibility and carryover of strategies.    Speech Therapy Frequency 2x / week   Duration --  8 weeks   Treatment/Interventions Internal/external aids;SLP instruction and feedback;Language facilitation;Compensatory techniques;Environmental controls;Multimodal communcation approach;Cueing hierarchy;Functional tasks;Patient/family education   Potential to Achieve Goals Good   Potential Considerations Severity of impairments      Patient will benefit from skilled therapeutic intervention in order to improve the following deficits and impairments:   Dysarthria and anarthria  Aphasia    Problem List Patient Active Problem List   Diagnosis Date Noted  . Encounter for therapeutic drug monitoring 01/05/2016  . Paroxysmal atrial fibrillation ([place]) 12/24/2015  . Cerebral infarction due to embolism of left middle cerebral artery ([place]) 06/02/2015  . HLD (hyperlipidemia) 06/02/2015  . PD (Parkinson's disease) ([place]) 06/02/2015  . Mitral regurgitation 03/17/2015  . CVA (cerebral infarction)   . TIA (transient ischemic attack) 03/05/2015  . Slurred speech 03/05/2015  . Anxiety 12/19/2013  . Parkinson's disease ([place]) 08/06/2013  . Dyslipidemia 08/06/2013  . S/P mitral valve repair 08/06/2013    [facility] ,MS, CCC-SLP  08/10/2017, 3:29 PM  [facility] [address] [place], [place], [address] Phone: [phone]   Fax:  [phone]   Name: [name] [name] MRN: [id number] Date of Birth: 1932/10/14

## 2017-08-11 ENCOUNTER — Encounter: Payer: Medicare Other | Admitting: Occupational Therapy

## 2017-08-11 ENCOUNTER — Encounter: Payer: Medicare Other | Admitting: Speech Pathology

## 2017-08-11 NOTE — Therapy (Signed)
[facility] [address] [place], [place], [address] Phone: [phone]   Fax:  [phone]  Occupational Therapy Treatment  Patient Details  Name: [name] [name] MRN: [id number] Date of Birth: 20-Jun-1932 Referring Provider: Dr.Siddiqui  Encounter Date: 08/09/2017      OT End of Session - 08/11/17 1253    Visit Number 8   Number of Visits 17   Date for OT Re-Evaluation 08/27/17   Authorization Type UHC MCR   Authorization - Visit Number 8   Authorization - Number of Visits 10   OT Start Time 0850   OT Stop Time 0930   OT Time Calculation (min) 40 min   Activity Tolerance Patient tolerated treatment well   Behavior During Therapy [facility] for tasks assessed/performed      Past Medical History:  Diagnosis Date  . Dyslipidemia   . Parkinson's disease ([place])   . S/P mitral valve replacement    mitral regurg, endocarditis  . Stroke [facility])     Past Surgical History:  Procedure Laterality Date  . CATARACT EXTRACTION  2009, 2012   right 2009, left 2012  . LOOP RECORDER IMPLANT Left 03/13/2015   Procedure: LOOP RECORDER IMPLANT;  Surgeon: Evans Lance, MD;  Location: [facility] CATH LAB;  Service: Cardiovascular;  Laterality: Left;  . MITRAL VALVE ANNULOPLASTY  04/10/2007    48m Edwards ring; r/t h/o MR and flail mitral leaflets due to endocarditis: MRI safe  . TEE WITHOUT CARDIOVERSION N/A 03/13/2015   Procedure: TRANSESOPHAGEAL ECHOCARDIOGRAM (TEE);  Surgeon: DLarey Dresser MD;  [place] [facility]  Service: Cardiovascular;  Laterality: N/A;  . TRANSTHORACIC ECHOCARDIOGRAM  08/15/2012   EF=>55%; normal LV systolic function; RV mildly dilated & systolic function mildly reduced; RA mod dilated; mild -mod MR & mildy increased gradients; mild TR; AV mildly sclerotic with mild-mod regurg    There were no vitals filed for this visit.           treatment: Practiced carrying a box with bilateral UE's, due to posture pt is  at increased fall risk, pt does better with a grocery back in each hand, as he is able to stand more upright. Pt wife verbalize understanding.                 OT Education - 08/11/17 1302    Education provided Yes   Education Details PWR! basic 4 in standing, recommendation that pt carries groceries in both hands rather than carrying a box to reduce risk of falls, keeping thinking skills sharp   Person(s) Educated Patient;Spouse   Methods Explanation;Demonstration;Verbal cues;Handout   Comprehension Verbalized understanding;Returned demonstration          OT Short Term Goals - 07/26/17 1030      OT SHORT TERM GOAL #1   Title I with PD specific HEP   Time 4   Period Weeks   Status Achieved     OT SHORT TERM GOAL #2   Title Pt will demonstrate improved fine motor coordination for ADLs as evidenced by decreasing RUE  9 hole peg test score to 40 secs or less.   Time 4   Period Weeks   Status Achieved  38.56 secs     OT SHORT TERM GOAL #3   Title Pt will demonstrate increased ease with dressing as evidenced by decreasing PPT#4 to 29 secs or less.   Baseline 32.55 secs   Time 4   Period Weeks   Status Achieved  9.69 secs  OT Long Term Goals - 08/09/17 9983      OT LONG TERM GOAL #1   Title Pt will verbalize understanding of strategies/AE to assist with ADLs/IADLs prn.--   Time 8   Period Weeks   Status Achieved     OT LONG TERM GOAL #2   Title Pt / wife will verbalize understanding of compensatory strategies for short term memory deficits and ways to keep thinking skills sharp.   Time 8   Period Weeks   Status Achieved     OT LONG TERM GOAL #3   Title Pt will report increased eased with reaching behind back to tuck in shirt, and wash back.   Time 8   Period Weeks   Status Achieved     OT LONG TERM GOAL #4   Title Pt will verbalize understanding of ways to prevent future PD related complications and verbalize understanding of community  resources.   Time 8   Period Weeks   Status Achieved               Plan - 08/11/17 1255    Clinical Impression Statement Pt demonstrates excellent overall progress. Pt/ wife agree with plans for discharge.   Rehab Potential Good   Current Impairments/barriers affecting progress: cognitive deficits   OT Frequency 2x / week   OT Duration 8 weeks   OT Treatment/Interventions Self-care/ADL training;Moist Heat;Fluidtherapy;DME and/or AE instruction;Splinting;Patient/family education;Balance training;Therapeutic exercises;Ultrasound;Therapeutic exercise;Therapeutic activities;Cognitive remediation/compensation;Passive range of motion;Functional Mobility Training;Neuromuscular education;Energy conservation;Manual Therapy;Visual/perceptual remediation/compensation   Plan Pt can benefit from occupational therapy re-eval in 6 mons due to the progressive nature of PD.   OT Home Exercise Plan Education provided:  PWR hands (basic 4), coordination HEP; ADLs with large movements   Consulted and Agree with Plan of Care Patient;Family member/caregiver   Family Member Consulted wife      Patient will benefit from skilled therapeutic intervention in order to improve the following deficits and impairments:  Abnormal gait, Decreased coordination, Decreased range of motion, Difficulty walking, Impaired flexibility, Decreased safety awareness, Decreased endurance, Decreased activity tolerance, Decreased knowledge of precautions, Impaired tone, Impaired UE functional use, Pain, Decreased balance, Decreased cognition, Decreased mobility, Decreased strength, Impaired vision/preception  Visit Diagnosis: Attention and concentration deficit  Other symptoms and signs involving the musculoskeletal system  Visuospatial deficit  Other symptoms and signs involving the nervous system  Abnormal posture  Unsteadiness on feet  Other abnormalities of gait and mobility  Other lack of coordination       OCCUPATIONAL THERAPY DISCHARGE SUMMARY    Current functional level related to goals / functional outcomes: Pt met all goals he demonstrates excellent progress.   Remaining deficits: Bradykinesia, decreased balance, abnormal posture, decreased coordination, rigidity, cognitive deficits   Education / Equipment: Pt/ wife were educated regarding: PD specific HEP, memory compensations/ ways to keep thinking skills sharp,  And adapted strategies for ADLs/IADLs. Pt/ wife verbalize understanding of all education. Plan: Patient agrees to discharge.  Patient goals were met. Patient is being discharged due to meeting the stated rehab goals.  ?????     Problem List Patient Active Problem List   Diagnosis Date Noted  . Encounter for therapeutic drug monitoring 01/05/2016  . Paroxysmal atrial fibrillation ([place]) 12/24/2015  . Cerebral infarction due to embolism of left middle cerebral artery ([place]) 06/02/2015  . HLD (hyperlipidemia) 06/02/2015  . PD (Parkinson's disease) ([place]) 06/02/2015  . Mitral regurgitation 03/17/2015  . CVA (cerebral infarction)   . TIA (transient ischemic attack) 03/05/2015  .  Slurred speech 03/05/2015  . Anxiety 12/19/2013  . Parkinson's disease ([place]) 08/06/2013  . Dyslipidemia 08/06/2013  . S/P mitral valve repair 08/06/2013    RINE,KATHRYN 08/11/2017, 1:11 PM Theone Murdoch, OTR/L [phone] Phone: [phone] 1:11 PM 08/11/17 [facility] [address] [place], [place], [address] Phone: [phone]   Fax:  [phone]  Name: Kreston Ahrendt MRN: [id number] Date of Birth: August 11, 1932

## 2017-08-25 ENCOUNTER — Other Ambulatory Visit: Payer: Self-pay | Admitting: *Deleted

## 2017-08-25 MED ORDER — WARFARIN SODIUM 5 MG PO TABS: ORAL_TABLET | ORAL | 3 refills | Status: DC

## 2017-08-29 ENCOUNTER — Ambulatory Visit (INDEPENDENT_AMBULATORY_CARE_PROVIDER_SITE_OTHER): Payer: Medicare Other | Admitting: *Deleted

## 2017-08-29 DIAGNOSIS — I63412 Cerebral infarction due to embolism of left middle cerebral artery: Secondary | ICD-10-CM

## 2017-08-30 NOTE — Progress Notes (Signed)
Carelink Summary Report / Loop Recorder 

## 2017-09-01 LAB — CUP PACEART REMOTE DEVICE CHECK
Implantable Pulse Generator Implant Date: 20160421
MDC IDC SESS DTM: 20181009004053

## 2017-09-06 ENCOUNTER — Ambulatory Visit (INDEPENDENT_AMBULATORY_CARE_PROVIDER_SITE_OTHER): Payer: Medicare Other | Admitting: *Deleted

## 2017-09-06 DIAGNOSIS — Z5181 Encounter for therapeutic drug level monitoring: Secondary | ICD-10-CM | POA: Diagnosis not present

## 2017-09-06 DIAGNOSIS — G458 Other transient cerebral ischemic attacks and related syndromes: Secondary | ICD-10-CM | POA: Diagnosis not present

## 2017-09-06 DIAGNOSIS — Z9889 Other specified postprocedural states: Secondary | ICD-10-CM | POA: Diagnosis not present

## 2017-09-06 DIAGNOSIS — I480 Paroxysmal atrial fibrillation: Secondary | ICD-10-CM | POA: Diagnosis not present

## 2017-09-06 DIAGNOSIS — G459 Transient cerebral ischemic attack, unspecified: Secondary | ICD-10-CM | POA: Diagnosis not present

## 2017-09-06 DIAGNOSIS — I63412 Cerebral infarction due to embolism of left middle cerebral artery: Secondary | ICD-10-CM

## 2017-09-06 LAB — POCT INR: INR: 1.7

## 2017-09-20 ENCOUNTER — Ambulatory Visit (INDEPENDENT_AMBULATORY_CARE_PROVIDER_SITE_OTHER): Payer: Medicare Other

## 2017-09-20 DIAGNOSIS — I480 Paroxysmal atrial fibrillation: Secondary | ICD-10-CM | POA: Diagnosis not present

## 2017-09-20 DIAGNOSIS — G459 Transient cerebral ischemic attack, unspecified: Secondary | ICD-10-CM

## 2017-09-20 DIAGNOSIS — Z9889 Other specified postprocedural states: Secondary | ICD-10-CM

## 2017-09-20 DIAGNOSIS — Z5181 Encounter for therapeutic drug level monitoring: Secondary | ICD-10-CM | POA: Diagnosis not present

## 2017-09-20 DIAGNOSIS — G458 Other transient cerebral ischemic attacks and related syndromes: Secondary | ICD-10-CM | POA: Diagnosis not present

## 2017-09-20 DIAGNOSIS — I63412 Cerebral infarction due to embolism of left middle cerebral artery: Secondary | ICD-10-CM | POA: Diagnosis not present

## 2017-09-20 LAB — POCT INR: INR: 2.1

## 2017-09-28 ENCOUNTER — Ambulatory Visit (INDEPENDENT_AMBULATORY_CARE_PROVIDER_SITE_OTHER): Payer: Medicare Other | Admitting: *Deleted

## 2017-09-28 DIAGNOSIS — I63412 Cerebral infarction due to embolism of left middle cerebral artery: Secondary | ICD-10-CM

## 2017-09-29 LAB — CUP PACEART REMOTE DEVICE CHECK
Date Time Interrogation Session: 20181108010924
MDC IDC PG IMPLANT DT: 20160421

## 2017-09-29 NOTE — Progress Notes (Signed)
Carelink Summary Report / Loop Recorder 

## 2017-10-18 ENCOUNTER — Ambulatory Visit (INDEPENDENT_AMBULATORY_CARE_PROVIDER_SITE_OTHER): Payer: Medicare Other | Admitting: *Deleted

## 2017-10-18 DIAGNOSIS — Z5181 Encounter for therapeutic drug level monitoring: Secondary | ICD-10-CM

## 2017-10-18 DIAGNOSIS — G459 Transient cerebral ischemic attack, unspecified: Secondary | ICD-10-CM

## 2017-10-18 DIAGNOSIS — Z9889 Other specified postprocedural states: Secondary | ICD-10-CM | POA: Diagnosis not present

## 2017-10-18 DIAGNOSIS — G458 Other transient cerebral ischemic attacks and related syndromes: Secondary | ICD-10-CM | POA: Diagnosis not present

## 2017-10-18 DIAGNOSIS — I63412 Cerebral infarction due to embolism of left middle cerebral artery: Secondary | ICD-10-CM | POA: Diagnosis not present

## 2017-10-18 DIAGNOSIS — I480 Paroxysmal atrial fibrillation: Secondary | ICD-10-CM | POA: Diagnosis not present

## 2017-10-18 LAB — POCT INR: INR: 2.4

## 2017-10-18 NOTE — Patient Instructions (Signed)
Continue same dosage 1/2 tablet daily except 1 tablet on Tuesdays.  Recheck INR in 4-5 weeks. Coumadin Clinic [phone] # [phone].

## 2017-10-28 ENCOUNTER — Ambulatory Visit (INDEPENDENT_AMBULATORY_CARE_PROVIDER_SITE_OTHER): Payer: Medicare Other | Admitting: *Deleted

## 2017-10-28 DIAGNOSIS — I639 Cerebral infarction, unspecified: Secondary | ICD-10-CM | POA: Diagnosis not present

## 2017-10-31 NOTE — Progress Notes (Signed)
Carelink Summary Report / Loop Recorder 

## 2017-11-07 LAB — CUP PACEART REMOTE DEVICE CHECK
Date Time Interrogation Session: 20181208013735
MDC IDC PG IMPLANT DT: 20160421

## 2017-11-09 ENCOUNTER — Telehealth: Payer: Self-pay | Admitting: Cardiology

## 2017-11-09 NOTE — Telephone Encounter (Signed)
LMOVM requesting that pt send manual transmission b/c home monitor has not updated in at least 14 days.    

## 2017-11-21 ENCOUNTER — Ambulatory Visit (INDEPENDENT_AMBULATORY_CARE_PROVIDER_SITE_OTHER): Payer: Medicare Other | Admitting: Pharmacist

## 2017-11-21 DIAGNOSIS — Z9889 Other specified postprocedural states: Secondary | ICD-10-CM | POA: Diagnosis not present

## 2017-11-21 DIAGNOSIS — G458 Other transient cerebral ischemic attacks and related syndromes: Secondary | ICD-10-CM

## 2017-11-21 DIAGNOSIS — I63412 Cerebral infarction due to embolism of left middle cerebral artery: Secondary | ICD-10-CM

## 2017-11-21 DIAGNOSIS — I480 Paroxysmal atrial fibrillation: Secondary | ICD-10-CM | POA: Diagnosis not present

## 2017-11-21 DIAGNOSIS — G459 Transient cerebral ischemic attack, unspecified: Secondary | ICD-10-CM

## 2017-11-21 DIAGNOSIS — Z5181 Encounter for therapeutic drug level monitoring: Secondary | ICD-10-CM

## 2017-11-21 LAB — POCT INR: INR: 1.5

## 2017-11-28 ENCOUNTER — Ambulatory Visit (INDEPENDENT_AMBULATORY_CARE_PROVIDER_SITE_OTHER): Payer: Medicare Other | Admitting: *Deleted

## 2017-11-28 DIAGNOSIS — G458 Other transient cerebral ischemic attacks and related syndromes: Secondary | ICD-10-CM

## 2017-11-28 NOTE — Progress Notes (Signed)
Carelink Summary Report / Loop Recorder 

## 2017-12-09 LAB — CUP PACEART REMOTE DEVICE CHECK
Date Time Interrogation Session: 20190107013956
MDC IDC PG IMPLANT DT: 20160421

## 2017-12-13 ENCOUNTER — Ambulatory Visit (INDEPENDENT_AMBULATORY_CARE_PROVIDER_SITE_OTHER): Payer: Medicare Other | Admitting: Pharmacist

## 2017-12-13 DIAGNOSIS — G459 Transient cerebral ischemic attack, unspecified: Secondary | ICD-10-CM

## 2017-12-13 DIAGNOSIS — G458 Other transient cerebral ischemic attacks and related syndromes: Secondary | ICD-10-CM

## 2017-12-13 DIAGNOSIS — I63412 Cerebral infarction due to embolism of left middle cerebral artery: Secondary | ICD-10-CM | POA: Diagnosis not present

## 2017-12-13 DIAGNOSIS — Z5181 Encounter for therapeutic drug level monitoring: Secondary | ICD-10-CM

## 2017-12-13 DIAGNOSIS — Z9889 Other specified postprocedural states: Secondary | ICD-10-CM | POA: Diagnosis not present

## 2017-12-13 DIAGNOSIS — I480 Paroxysmal atrial fibrillation: Secondary | ICD-10-CM | POA: Diagnosis not present

## 2017-12-13 LAB — POCT INR: INR: 1.8

## 2017-12-13 MED ORDER — WARFARIN SODIUM 5 MG PO TABS: ORAL_TABLET | ORAL | 0 refills | Status: DC

## 2017-12-27 ENCOUNTER — Ambulatory Visit (INDEPENDENT_AMBULATORY_CARE_PROVIDER_SITE_OTHER): Payer: Medicare Other | Admitting: *Deleted

## 2017-12-27 DIAGNOSIS — I63412 Cerebral infarction due to embolism of left middle cerebral artery: Secondary | ICD-10-CM

## 2017-12-28 NOTE — Progress Notes (Signed)
Carelink Summary Report / Loop Recorder 

## 2017-12-29 ENCOUNTER — Encounter: Payer: Self-pay | Admitting: Internal Medicine

## 2017-12-29 ENCOUNTER — Ambulatory Visit: Payer: Medicare Other | Admitting: Internal Medicine

## 2017-12-29 VITALS — BP 100/64 | HR 76 | Ht 70.0 in | Wt 184.6 lb

## 2017-12-29 DIAGNOSIS — I340 Nonrheumatic mitral (valve) insufficiency: Secondary | ICD-10-CM | POA: Diagnosis not present

## 2017-12-29 DIAGNOSIS — I480 Paroxysmal atrial fibrillation: Secondary | ICD-10-CM | POA: Diagnosis not present

## 2017-12-29 DIAGNOSIS — Z9889 Other specified postprocedural states: Secondary | ICD-10-CM

## 2017-12-29 MED ORDER — AMOXICILLIN 500 MG PO TABS: 500.0000 mg | ORAL_TABLET | Freq: Two times a day (BID) | ORAL | 1 refills | Status: DC

## 2017-12-29 MED ORDER — AMOXICILLIN 500 MG PO TABS: 500.0000 mg | ORAL_TABLET | Freq: Two times a day (BID) | ORAL | 3 refills | Status: DC

## 2017-12-29 NOTE — Progress Notes (Signed)
Follow-up Outpatient Visit Date: 12/29/2017  Primary Care Provider: Rodrigo Ran, MD [address] [place] [address]  Chief Complaint: Follow-up atrial fibrillation and valvular heart disease.  HPI:  [name] [name] is a 84 y.o. year-old male with history of mitral valve endocarditis status post repair (2008), cryptogenic stroke with subsequent discovery of paroxysmal atrial fibrillation by implantable loop recorder, hyperlipidemia, and Parkinson's disease, who presents for follow-up of mitral valve disease and atrial fibrillation.  He also follows closely in our EP clinic with Dr. Ladona Ridgel.  From a general cardiology standpoint, he was previously followed by Dr. Shirlee Latch.  Today, [name] [name] reports he has been doing relatively well.  He notes occasional dyspnea when he bends over and slouches forward, which happens frequently due to his history of Parkinson's disease.  However, when he stands up straight, the dyspnea resolves.  This has been stable for several years.  He denies chest pain, palpitations, headedness, shortness of breath, and edema.  He has not had any recent falls and ambulates with a cane.  He remains compliant with warfarin, though he and his wife bring up the possibility of switching to a NOAC at some point.  --------------------------------------------------------------------------------------------------  Past Medical/Surgical History 1. Mitral regurgitation: MV repair in 2008 with flail P2 segment in setting of MV endocarditis.  LA appendage was oversewn.  Echo (4/16) with EF 55-60%, mild AI, MV repair with mean gradient 3 mmHg, moderate MR, RV moderately dilated with normal systolic function.  TEE (4/16) with EF 55%, s/p MV repair with suspected loose suture material and small area of annuloplasty ring dehiscence causing moderate MR, LAA oversewn, mildly dilated RV with normal systolic function.  Echo (10/16) with EF 55-60%, moderate LVH, s/p MV repair with only mild MR  visualized on this study, mild RV dilation with normal function, LAE, PASP 30 mmHg.  2. Parkinsons disease: Followed at [facility].  3. CVA: 4/16 with right facial droop and expressive aphasia.  This resolved completely.  Carotid dopplers 4/16 showed mild plaque only.  He has ILR.  4. LHC (5/08) with no CAD.  5. Hyperlipidemia   Current Meds  Medication Sig  . amantadine (SYMMETREL) 100 MG capsule Take 100 mg by mouth 2 (two) times daily.  Marland Kitchen atorvastatin (LIPITOR) 20 MG tablet Take 20 mg by mouth daily at 6 PM.   . cholecalciferol (VITAMIN D) 1000 UNITS tablet Take 1,000 Units by mouth daily.  [name] [name] Calcium (STOOL SOFTENER PO) Take 1 capsule by mouth daily as needed (constipation).   Marland Kitchen donepezil (ARICEPT) 10 MG tablet Take 1 tablet by mouth at bedtime.  Marland Kitchen escitalopram (LEXAPRO) 10 MG tablet Take 10 mg by mouth daily.  Marland Kitchen ibandronate (BONIVA) 150 MG tablet Take 1 tablet by mouth every 30 (thirty) days.  . Multiple Vitamins-Minerals (MULTIVITAMIN PO) Take 1 tablet by mouth daily.   . rasagiline (AZILECT) 1 MG TABS tablet Take 1 mg by mouth every morning. .  . rotigotine (NEUPRO) 4 MG/24HR Place 1 patch onto the skin daily.  Marland Kitchen RYTARY 48.75-195 MG CPCR Take 9 capsules by mouth See admin instructions. 3 caps in the morning with Azelect, 3 caps at 1pm with Amantadine, 2 caps at 6pm with Amantadine and 1 cap at bedtime.  . vitamin B-12 (CYANOCOBALAMIN) 1000 MCG tablet Take 1,000 mcg by mouth as directed.   . vitamin E 400 UNIT capsule Take 400 Units by mouth daily.  Marland Kitchen warfarin (COUMADIN) 5 MG tablet TAKE 1/2 to 1 tablet daily as directed by  coumadin clinic    Allergies: Ace inhibitors; Demerol [meperidine]; and Poison ivy extract  Social History   Socioeconomic History  . Marital status: Married    Spouse name: Not on file  . Number of children: 3  . Years of education: law school  . Highest education level: Not on file  Social Needs  . Financial resource strain: Not on file  .  Food insecurity - worry: Not on file  . Food insecurity - inability: Not on file  . Transportation needs - medical: Not on file  . Transportation needs - non-medical: Not on file  Occupational History  . Occupation: retired  Tobacco Use  . Smoking status: Never Smoker  . Smokeless tobacco: Never Used  Substance and Sexual Activity  . Alcohol use: Yes    Alcohol/week: 1.2 oz    Types: 1 Glasses of wine, 1 Cans of beer per week    Comment: 1 can of beer or 1 wine every day  . Drug use: No  . Sexual activity: Not on file  Other Topics Concern  . Not on file  Social History Narrative  . Not on file    Family History  Problem Relation Age of Onset  . Stroke Mother        cerebral hemorrahge  . Heart attack Mother   . Prostate cancer Father   . Heart attack Father   . Hypertension Brother   . Valvular heart disease Brother     Review of Systems: A 12-system review of systems was performed and was negative except as noted in the HPI.  --------------------------------------------------------------------------------------------------  Physical Exam: BP 100/64   Pulse 76   Ht 5\' 10"  (1.778 m)   Wt 184 lb 9.6 oz (83.7 kg)   SpO2 93%   BMI 26.49 kg/m   General: Well-developed man, seated comfortably in the exam room.  He is accompanied by his wife. HEENT: No conjunctival pallor or scleral icterus. Moist mucous membranes.  OP clear. Neck: Supple without lymphadenopathy, thyromegaly, JVD, or HJR. Lungs: Normal work of breathing. Clear to auscultation bilaterally without wheezes or crackles. Heart: Regular rate and rhythm with 2/6 holosystolic murmur loudest at the apex.  No rubs or gallops.  Nondisplaced PMI. Abd: Bowel sounds present. Soft, NT/ND without hepatosplenomegaly Ext: No lower extremity edema. Radial, PT, and DP pulses are 2+ bilaterally. Skin: Warm and dry without rash.  EKG: Normal sinus rhythm with nonspecific T wave changes.  Lab Results  Component Value  Date   WBC 4.9 04/02/2016   HGB 14.3 04/02/2016   HCT 41.9 04/02/2016   MCV 91.5 04/02/2016   PLT 148 04/02/2016    Lab Results  Component Value Date   NA 137 12/25/2015   K 4.4 12/25/2015   CL 102 12/25/2015   CO2 27 12/25/2015   BUN 20 12/25/2015   CREATININE 1.16 (H) 12/25/2015   GLUCOSE 100 (H) 12/25/2015   ALT 9 03/05/2015    Lab Results  Component Value Date   CHOL 152 06/25/2015   HDL 49.50 06/25/2015   LDLCALC 77 06/25/2015   TRIG 129.0 06/25/2015   CHOLHDL 3 06/25/2015    --------------------------------------------------------------------------------------------------  ASSESSMENT AND PLAN: Mitral regurgitation status post mitral valve repair Stable dyspnea when bending over.  Otherwise no symptoms to suggest worsening valve function.  [name] [name] appears euvolemic on exam today.  We discussed utility of repeating an echocardiogram, though we have agreed to defer this.  Prescription provided today for prophylactic antibiotics before dental surgery.  Paroxysmal atrial fibrillation EKG today demonstrates sinus rhythm.  No symptoms to suggest recurrence of atrial fibrillation.  Implantable loop recorder remains in place with no atrial fibrillation noted on most recent check.  We discussed continuation of warfarin versus transition to a novel oral anticoagulant.  Given that Mr. Thora LanceMeier does not have a valve prosthesis, it is conceivable that he could be switched to a NOAC.  However, as he is tolerating warfarin well, we have agreed to continue with this for the time being.  We will readdress it when he follows up.  Continue routine follow-up with Dr. Ladona Ridgelaylor, including discussion of loop recorder removal when Kalyn Dimattia-of-life is reached.  Follow-up: Return to clinic in 1 year.  Yvonne Kendallhristopher Ashley Bultema, MD 12/29/2017 2:12 PM

## 2017-12-29 NOTE — Patient Instructions (Addendum)
Medication Instructions:  Take Amoxicillin  4 tablets by mouth 30-60 minutes prior to dental work.   -- If you need a refill on your cardiac medications before your next appointment, please call your pharmacy. --  Labwork: None ordered  Testing/Procedures: None ordered  Follow-Up: Your physician wants you to follow-up in: 1 year with Dr. Okey DupreEnd.    You will receive a reminder letter in the mail two months in advance. If you don't receive a letter, please call our office to schedule the follow-up appointment.  Thank you for choosing [facility]!!    Any Other Special Instructions Will Be Listed Below (If Applicable).

## 2018-01-03 ENCOUNTER — Ambulatory Visit (INDEPENDENT_AMBULATORY_CARE_PROVIDER_SITE_OTHER): Payer: Medicare Other | Admitting: Pharmacist

## 2018-01-03 DIAGNOSIS — I63412 Cerebral infarction due to embolism of left middle cerebral artery: Secondary | ICD-10-CM

## 2018-01-03 DIAGNOSIS — Z9889 Other specified postprocedural states: Secondary | ICD-10-CM

## 2018-01-03 DIAGNOSIS — I480 Paroxysmal atrial fibrillation: Secondary | ICD-10-CM | POA: Diagnosis not present

## 2018-01-03 DIAGNOSIS — G458 Other transient cerebral ischemic attacks and related syndromes: Secondary | ICD-10-CM | POA: Diagnosis not present

## 2018-01-03 DIAGNOSIS — Z5181 Encounter for therapeutic drug level monitoring: Secondary | ICD-10-CM

## 2018-01-03 DIAGNOSIS — G459 Transient cerebral ischemic attack, unspecified: Secondary | ICD-10-CM

## 2018-01-03 LAB — POCT INR: INR: 2

## 2018-01-17 LAB — CUP PACEART REMOTE DEVICE CHECK
Implantable Pulse Generator Implant Date: 20160421
MDC IDC SESS DTM: 20190206023913

## 2018-01-30 ENCOUNTER — Ambulatory Visit (INDEPENDENT_AMBULATORY_CARE_PROVIDER_SITE_OTHER): Payer: Medicare Other | Admitting: *Deleted

## 2018-01-30 DIAGNOSIS — I480 Paroxysmal atrial fibrillation: Secondary | ICD-10-CM

## 2018-01-30 NOTE — Progress Notes (Signed)
Carelink Summary Report / Loop Recorder 

## 2018-01-31 ENCOUNTER — Ambulatory Visit (INDEPENDENT_AMBULATORY_CARE_PROVIDER_SITE_OTHER): Payer: Medicare Other | Admitting: Pharmacist Clinician (PhC)/ Clinical Pharmacy Specialist

## 2018-01-31 DIAGNOSIS — Z9889 Other specified postprocedural states: Secondary | ICD-10-CM

## 2018-01-31 DIAGNOSIS — Z5181 Encounter for therapeutic drug level monitoring: Secondary | ICD-10-CM | POA: Diagnosis not present

## 2018-01-31 DIAGNOSIS — G458 Other transient cerebral ischemic attacks and related syndromes: Secondary | ICD-10-CM

## 2018-01-31 DIAGNOSIS — I63412 Cerebral infarction due to embolism of left middle cerebral artery: Secondary | ICD-10-CM | POA: Diagnosis not present

## 2018-01-31 DIAGNOSIS — I480 Paroxysmal atrial fibrillation: Secondary | ICD-10-CM | POA: Diagnosis not present

## 2018-01-31 DIAGNOSIS — G459 Transient cerebral ischemic attack, unspecified: Secondary | ICD-10-CM

## 2018-01-31 LAB — POCT INR: INR: 2

## 2018-02-02 ENCOUNTER — Encounter: Payer: Self-pay | Admitting: Speech Pathology

## 2018-02-02 ENCOUNTER — Ambulatory Visit: Payer: Medicare Other

## 2018-02-02 ENCOUNTER — Ambulatory Visit: Payer: Medicare Other | Admitting: Occupational Therapy

## 2018-02-02 ENCOUNTER — Ambulatory Visit: Payer: Medicare Other | Admitting: Speech Pathology

## 2018-02-02 ENCOUNTER — Ambulatory Visit: Payer: Medicare Other | Attending: Internal Medicine | Admitting: Occupational Therapy

## 2018-02-02 DIAGNOSIS — R293 Abnormal posture: Secondary | ICD-10-CM | POA: Diagnosis present

## 2018-02-02 DIAGNOSIS — R2681 Unsteadiness on feet: Secondary | ICD-10-CM | POA: Insufficient documentation

## 2018-02-02 DIAGNOSIS — R41841 Cognitive communication deficit: Secondary | ICD-10-CM | POA: Insufficient documentation

## 2018-02-02 DIAGNOSIS — R41842 Visuospatial deficit: Secondary | ICD-10-CM | POA: Diagnosis present

## 2018-02-02 DIAGNOSIS — R2689 Other abnormalities of gait and mobility: Secondary | ICD-10-CM | POA: Diagnosis present

## 2018-02-02 DIAGNOSIS — R29898 Other symptoms and signs involving the musculoskeletal system: Secondary | ICD-10-CM | POA: Diagnosis present

## 2018-02-02 DIAGNOSIS — R278 Other lack of coordination: Secondary | ICD-10-CM | POA: Diagnosis present

## 2018-02-02 DIAGNOSIS — R471 Dysarthria and anarthria: Secondary | ICD-10-CM | POA: Diagnosis present

## 2018-02-02 DIAGNOSIS — R41840 Attention and concentration deficit: Secondary | ICD-10-CM | POA: Diagnosis present

## 2018-02-02 DIAGNOSIS — R29818 Other symptoms and signs involving the nervous system: Secondary | ICD-10-CM | POA: Insufficient documentation

## 2018-02-02 NOTE — Therapy (Signed)
[facility] [address] [place], [place], [address] Phone: [phone]   Fax:  [phone]  Speech Language Pathology Evaluation  Patient Details  Name: [name] HensenJohn [name] MRN: [id number] Date of Birth: 05/28/1932 Referring Provider: Dr. Jaquita FoldsMustafa Siddiqui   Encounter Date: 02/02/2018  End of Session - 02/02/18 1200    Visit Number  1    Number of Visits  17    Date for SLP Re-Evaluation  04/07/18    Authorization Type  Pt going on 1 week vacation, so I set date for re-eval at 9 weeks instead of 8    SLP Start Time  1102    SLP Stop Time   1147    SLP Time Calculation (min)  45 min    Activity Tolerance  Patient tolerated treatment well       Past Medical History:  Diagnosis Date  . Dyslipidemia   . Parkinson's disease (HCC)   . S/P mitral valve replacement    mitral regurg, endocarditis  . Stroke [facility](HCC)     Past Surgical History:  Procedure Laterality Date  . CATARACT EXTRACTION  2009, 2012   right 2009, left 2012  . LOOP RECORDER IMPLANT Left 03/13/2015   Procedure: LOOP RECORDER IMPLANT;  Surgeon: Marinus MawGregg W Taylor, MD;  [facility];  Service: Cardiovascular;  Laterality: Left;  . 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 TEE WITHOUT CARDIOVERSION N/A 03/13/2015   Procedure: TRANSESOPHAGEAL ECHOCARDIOGRAM (TEE);  Surgeon: Laurey Moralealton S McLean, MD;  [facility];  Service: Cardiovascular;  Laterality: N/A;  . TRANSTHORACIC ECHOCARDIOGRAM  08/15/2012   EF=>55%; normal LV systolic function; RV mildly dilated & systolic function mildly reduced; RA mod dilated; mild -mod MR & mildy increased gradients; mild TR; AV mildly sclerotic with mild-mod regurg    There were no vitals filed for this visit.  Subjective Assessment - 02/02/18 1152    Special Tests  spouse    Currently in Pain?  No/denies         SLP Evaluation  [facility] - 02/02/18 1152      SLP Visit Information   SLP Received On  02/02/18    Referring Provider  Dr. Jaquita FoldsMustafa Siddiqui    Onset Date  Diagnosed November 2004    Medical Diagnosis  Parkinson's disease      Subjective   Patient/Family Stated Goal  To be understood by other people      General Information   HPI  Pt known from previous course of ST, ceased abruptly due to non-clinical reasons. Pt with complaints of decr'd loudness and rapid rate of speech has worsened since last course of ST. Spouse also reporting significant memory and attention impairments affecting daily life and increasing her burden of care.     Mobility Status  walks indpendently      Balance Screen   Has the patient fallen in the past 6 months  No    Has the patient had a decrease in activity level because of a fear of falling?   No    Is the patient reluctant to leave their home because of a fear of falling?   No      Prior Functional Status   Cognitive/Linguistic Baseline  Within functional limits    Type of Home  House     Lives With  Spouse    Available Support  Friend(s)  Vocation  Retired      Copy Status  Impaired/Different from baseline    Area of Impairment  Memory;Attention;Awareness;Problem solving    Memory  Decreased short-term memory    Memory  Impaired    Memory Impairment  Decreased short term memory    Awareness  Impaired    Awareness Impairment  Intellectual impairment    Problem Solving  Impaired    Problem Solving Impairment  Functional complex;Verbal complex    Executive Function  Decision Making;Self Monitoring;Self Correcting;Reasoning;Organizing      Auditory Comprehension   Overall Auditory Comprehension  Appears within functional limits for tasks assessed      Reading Comprehension   Reading Status  Within funtional limits      Expression   Primary Mode of Expression  Verbal      Oral Motor/Sensory Function   Overall Oral Motor/Sensory Function   Impaired    Labial ROM  Within Functional Limits    Labial Symmetry  Within Functional Limits    Labial Strength  Within Functional Limits    Labial Coordination  Reduced    Lingual ROM  Reduced right;Reduced left    Lingual Symmetry  Within Functional Limits    Lingual Coordination  Reduced    Facial ROM  Within Functional Limits    Velum  Within Functional Limits    Mandible  Within Functional Limits      Motor Speech   Overall Motor Speech  Impaired    Respiration  Impaired    Level of Impairment  Word    Phonation  Low vocal intensity    Resonance  Within functional limits    Articulation  Impaired    Level of Impairment  Sentence    Intelligibility  Intelligibility reduced    Word  75-100% accurate    Phrase  75-100% accurate    Sentence  75-100% accurate    Conversation  50-74% accurate    Effective Techniques  Slow rate;Increased vocal intensity;Pause;Pacing                      SLP Education - 02/02/18 1158    Education provided  Yes    Education Details  Loud "AH", HEP for dysarthria, cognitive activities to do at home, goals for ST (cognition and speech)    Person(s) Educated  Patient;Spouse    Methods  Explanation;Demonstration;Verbal cues;Handout    Comprehension  Verbalized understanding;Returned demonstration;Verbal cues required;Need further instruction       SLP Short Term Goals - 02/02/18 1205      SLP SHORT TERM GOAL #1   Title  pt will maintain loud /a/ average 90dB x 4 sessions    Time  4    Period  Weeks    Status  New      SLP SHORT TERM GOAL #2   Title  pt will demo 18/20 sentences with average low 70s dB x2 sessions    Time  4    Period  Weeks    Status  New      SLP SHORT TERM GOAL #3   Title  Pt will demonstrate WNL rate of speech 18/20 sentneces with occasional min A over 2 sessions    Time  4    Period  Weeks    Status  New      SLP SHORT TERM GOAL #4   Title  Pt and spouse will utilize 3 external aids and  compensations for  attention and memory with success (per their report) over 3 sessions.     Time  4    Period  Weeks    Status  New       SLP Long Term Goals - 02/02/18 1207      SLP LONG TERM GOAL #1   Title  pt will demo loudness average low 70s dB  in 10 minutes simple conversation with occasional cues x3 sessions    Time  8    Period  Weeks    Status  New      SLP LONG TERM GOAL #2   Title  Pt will be 90% intelligible over 8 minute simple conversation in mildly noisy environment over 8 minutes with occasional min A over 2 sessions    Time  8    Period  Weeks    Status  New      SLP LONG TERM GOAL #3   Title  Pt and spouse will carryover 4 compensations for reduced attention and memory over 3 sessions per their report.     Time  8    Period  Weeks    Status  New       Plan - 02/02/18 1201    Clinical Impression Statement  Mr. Breon is referred for re-evaluation for ST due to Parkinson's Disease with exacerbated dysarthria and reduced cognition. His spouse reports pt with poor short term memory and poor attention, starting complex tasks, then not finishing them. She also reports pt is having significant difficulty and frustration working on his computer.    Oral motor assessment revealed reduced lingual coordination and reduced labial coordination.  Velar ROM appeared [facility]   Measured when a sound level meter was placed 30 cm away from pt's mouth, 5 minutes of conversational speech was reduced today, at average 65dB (WNL= average 70-72dB) with range of 63 to 68dB. Overall speech intelligibility for this listener in a quiet environment was not affected, at approx 85%, due to volume and rapid rate of speech. Production of loud /a/ averaged 88dB (range of 86 to 90dB) and occasional min cues needed for loudness.   Pt then rated effort level at 8/10 for production of loud /a/ (10=maximal effort). In rote speech task pt was asked to use the same amount of effort as with loud /a/.  Loudness average with this increased effort was 72dB (range of 70 to 74dB) with min A occasionally for loudness. Pt would benefit from skilled ST in order to improve speech intelligibility and pt's QOL.  Cognition informally assessed - pt required cues for immediate recall of simple visual information and attention to details in instructions. He demonstrated reduced mental attention, intellectual awareness, and problem solving. I recommend skilled ST to train pt and spouse in compensations for memory and attention.     Speech Therapy Frequency  2x / week    Treatment/Interventions  Internal/external aids;SLP instruction and feedback;Language facilitation;Compensatory techniques;Environmental controls;Multimodal communcation approach;Cueing hierarchy;Functional tasks;Patient/family education    Potential to Achieve Goals  Fair    Potential Considerations  Severity of impairments;Ability to learn/carryover information    Consulted and Agree with Plan of Care  Patient;Family member/caregiver       Patient will benefit from skilled therapeutic intervention in order to improve the following deficits and impairments:   Dysarthria and anarthria  Cognitive communication deficit    Problem List Patient Active Problem List   Diagnosis Date Noted  . Encounter for therapeutic drug monitoring 01/05/2016  . Paroxysmal  atrial fibrillation (HCC) 12/24/2015  . 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 HLD (hyperlipidemia) 06/02/2015  . PD (Parkinson's disease) (HCC) 06/02/2015  . Mitral regurgitation 03/17/2015  . CVA (cerebral infarction)   . TIA (transient ischemic attack) 03/05/2015  . Slurred speech 03/05/2015  . Anxiety 12/19/2013  . Parkinson's disease (HCC) 08/06/2013  . Dyslipidemia 08/06/2013  . S/P mitral valve repair 08/06/2013    Lovvorn, Radene Journey MS, CCC-SLP 02/02/2018, 12:10 PM  [facility] [address] [place], [place], [address] Phone: [phone]   Fax:  [phone]  Name: Shavon Zenz MRN: [id number] Date of Birth: Feb 19, 1932

## 2018-02-02 NOTE — Patient Instructions (Signed)
    Get the persons attention before you speak  Use eye contact and face the person you are speaking to  Be in close proximity to the person you are speaking to  Turn down any noise in the environment such as the TV, walk away from loud appliances, air conditioners, fans, dish washers etc  Memory game or app  Simon  Talk Path by Zella RicherLingraphica  Constant Therapy    Cognitive Activities you can do at home:   - Solitaire  - Majong  - Scrabble  - Chess/Checkers  - Crosswords (easy level)  - Education officer, communityUno  - Card Games  - Board Games  - Connect 4  - Simon  - the Memory Game  - Dominoes  - Backgammon  - Family Feud  On your computer, tablet or phone: Air traffic controllerBrainHQ Memory Match Game App [employer] Spot the difference games  Loud "AH!" - shoot for 90dB, effort was 8/10  After loud "AH" practice rote speech (pledge, Lord's Prayer, [address], [address] [place], Act of Contrition, nursery rhymes, [place], GirardPre-amble, etc)    dB Meter - Black with a red circle

## 2018-02-02 NOTE — Therapy (Signed)
[facility] [address] [place], [place], [address] Phone: [phone]   Fax:  [phone]  Occupational Therapy Treatment  Patient Details  Name: [name] [name] MRN: [id number] Date of Birth: 1932-08-10 Referring Provider: Dr. Rubin Payor   Encounter Date: 02/02/2018  OT End of Session - 02/02/18 1120    Visit Number  1    Number of Visits  11    Date for OT Re-Evaluation  03/11/18    Authorization Type  [employer] MCR    OT Start Time  1020    OT Stop Time  1100    OT Time Calculation (min)  40 min       Past Medical History:  Diagnosis Date  . Dyslipidemia   . Parkinson's disease (HCC)   . S/P mitral valve replacement    mitral regurg, endocarditis  . Stroke [facility])     Past Surgical History:  Procedure Laterality Date  . CATARACT EXTRACTION  2009, 2012   right 2009, left 2012  . LOOP RECORDER IMPLANT Left 03/13/2015   Procedure: LOOP RECORDER IMPLANT;  Surgeon: Marinus Maw, MD;  Location: [facility] CATH LAB;  Service: Cardiovascular;  Laterality: Left;  . 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 TEE WITHOUT CARDIOVERSION N/A 03/13/2015   Procedure: TRANSESOPHAGEAL ECHOCARDIOGRAM (TEE);  Surgeon: Laurey Morale, MD;  Location: [facility] ENDOSCOPY;  Service: Cardiovascular;  Laterality: N/A;  . TRANSTHORACIC ECHOCARDIOGRAM  08/15/2012   EF=>55%; normal LV systolic function; RV mildly dilated & systolic function mildly reduced; RA mod dilated; mild -mod MR & mildy increased gradients; mild TR; AV mildly sclerotic with mild-mod regurg    There were no vitals filed for this visit.  Subjective Assessment - 02/02/18 1023    Subjective   Pt has had a slight decline in ADLS    Pertinent History  PD, hx of CVA mitral valve replacement, dyslipidemia, a-fib    Patient Stated Goals  maintain independence    Currently in Pain?  No/denies          [facility] OT Assessment - 02/02/18 1025      Assessment   Medical Diagnosis  Parkinson's     Referring Provider  Dr. Rubin Payor    Onset Date/Surgical Date  01/17/18 referral date      Precautions   Precautions  Fall      Balance Screen   Has the patient fallen in the past 6 months  No    Has the patient had a decrease in activity level because of a fear of falling?   No    Is the patient reluctant to leave their home because of a fear of falling?   No      Home  Environment   Family/patient expects to be discharged to:  Private residence    Living Arrangements  Spouse/significant other    Home Layout  Able to live on main level with bedroom/bathroom    Lives With  Spouse      Prior Function   Level of Independence  Independent    Leisure  travel, yardwork, fixing things      ADL   Eating/Feeding  Modified independent    Grooming  Modified independent    Upper Body Bathing  Modified independent    Lower Body Bathing  Modified independent    Upper Body Dressing  Minimal assistance;Needs assist for fasteners difficulty  with buttons    Lower Body Dressing  Increased time;Modified independent    Toilet Transfer  Modified independent    Tub/Shower Transfer  Modified independent    ADL comments  Per pt and wife pt requires increased time and difficulty with ADLs      IADL   Shopping  Needs to be accompanied on any shopping trip    Light Housekeeping  Performs light daily tasks such as dishwashing, bed making    Meal Prep  Able to complete simple cold meal and snack prep    Medication Management  Has difficulty remembering to take medication    Financial Management  Dependent      Mobility   Mobility Status  Independent      Written Expression   Dominant Hand  Right    Handwriting  100% legible mild micrographia if hurrying,       Vision - History   Patient Visual Report  -- blurry vision, diplopia       Vision Assessment   Vision Assessment  Vision impaired  _ to be  further tested in functional context      Cognition   Overall Cognitive Status  Impaired/Different from baseline    Memory  Impaired    Memory Impairment  Decreased short term memory    Awareness  Impaired    Awareness Impairment  Intellectual impairment    Behaviors  -- delayed processing when lots of info provided      Observation/Other Assessments   Other Surveys   Select    Simulated Eating Time (seconds)  15.18 secs    Donning Doffing Jacket Time (seconds)  15.47 secs 3 button/ unbutton 61.75      Posture/Postural Control   Posture/Postural Control  Postural limitations    Postural Limitations  Forward head;Rounded Shoulders      Coordination   Fine Motor Movements are Fluid and Coordinated  No    9 Hole Peg Test  Right;Left    Right 9 Hole Peg Test  39.56 secs    Left 9 Hole Peg Test  35.78 secs    Box and Blocks  RUE 41 blocks 46 blocks LUE 44 blocks      Tone   Assessment Location  Right Upper Extremity rigidity      ROM / Strength   AROM / PROM / Strength  AROM      AROM   Overall AROM   Deficits    Overall AROM Comments  RUE shoulder flexion 130, elbow extension -5, LUE shoulder flexion 135, elbow extension -10 bilateral supination slight limitation, RUE grossly 90%, LUE 95%      RUE Tone   RUE Tone  Mild                            OT Long Term Goals - 02/02/18 1121      OT LONG TERM GOAL #1   Title  Pt will verbalize understanding of strategies/AE to assist with ADLs/IADLs prn.--03/11/18    Time  5    Period  Weeks    Status  New    Target Date  03/11/18      OT LONG TERM GOAL #2   Title  Pt / wife will verbalize understanding of compensatory strategies for short term memory deficits and ways to keep thinking skills sharp.    Time  5    Period  Weeks    Status  New      OT LONG TERM GOAL #3   Title  Pt will be I with updated PD specific HEP following review    Time  5    Period  Weeks    Status  New      OT LONG TERM GOAL #4    Title  Pt will demonstrate improved RUE fine motor coordination as evidenced by decreasing 9 hole peg test score by 3 secs    Baseline  RUE 39.56 secs, LUE 35.78 secs    Time  5    Period  Weeks    Status  New      OT LONG TERM GOAL #5   Title  Pt will demonstrate improved ease with dressing as evidenced by decreasing 3 button/ unbutton time to 58 secs or less.    Baseline  3 button/ unbutton 62 secs    Time  5    Period  Weeks    Status  New            Plan - 02/02/18 1127    Clinical Impression Statement     Pt is a 84 y.o male with diagnosis of Parkinson's disease who returns to occupational therapy with a decline in ADL performance per pt/ wife. Pt presents with decreased coordination, cognitive deficits, decreased balance, rigidity, bradykinesia, visuospatial deficits, rigidity, and abnormal posture which impedes performance of daily activities. Pt can benefit from skilled occupational therapy to maximize pt safety and independence with ADLs/ IADLS and to maintain quality of life.    Occupational Profile and client history currently impacting functional performance  PMH: PD,  CVA, mitral valve replacement, dyslipedemia, a-fib, Pt's deficits impede his ability to perfrom leisure activities including travel and yardwork.    Rehab Potential  Good    Current Impairments/barriers affecting progress:  cognitive deficits, decreased awareness of deficits    OT Frequency  2x / week    OT Duration  8 weeks    OT Treatment/Interventions  Self-care/ADL training;Therapeutic exercise;Visual/perceptual remediation/compensation;Patient/family education;Neuromuscular education;Moist Heat;Therapeutic activities;Cognitive remediation/compensation;DME and/or AE instruction;Cryotherapy    Plan  Coordination HEP, PWR! hands    Consulted and Agree with Plan of Care  Patient;Family member/caregiver    Family Member Consulted  wife       Patient will benefit from skilled therapeutic intervention in  order to improve the following deficits and impairments:  Abnormal gait, Decreased coordination, Decreased range of motion, Difficulty walking, Impaired flexibility, Decreased safety awareness, Decreased endurance, Decreased activity tolerance, Decreased knowledge of precautions, Impaired tone, Impaired UE functional use, Pain, Decreased balance, Decreased cognition, Decreased mobility, Decreased strength, Impaired vision/preception  Visit Diagnosis: Other lack of coordination - Plan: Ot plan of care cert/re-cert  Other symptoms and signs involving the musculoskeletal system - Plan: Ot plan of care cert/re-cert  Visuospatial deficit - Plan: Ot plan of care cert/re-cert  Attention and concentration deficit - Plan: Ot plan of care cert/re-cert  Other symptoms and signs involving the nervous system - Plan: Ot plan of care cert/re-cert  Abnormal posture - Plan: Ot plan of care cert/re-cert  Unsteadiness on feet - Plan: Ot plan of care cert/re-cert  Other abnormalities of gait and mobility - Plan: Ot plan of care cert/re-cert    Problem List Patient Active Problem List   Diagnosis Date Noted  . Encounter for therapeutic drug monitoring 01/05/2016  . Paroxysmal atrial fibrillation (HCC) 12/24/2015  . 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 HLD (  hyperlipidemia) 06/02/2015  . PD (Parkinson's disease) (HCC) 06/02/2015  . Mitral regurgitation 03/17/2015  . CVA (cerebral infarction)   . TIA (transient ischemic attack) 03/05/2015  . Slurred speech 03/05/2015  . Anxiety 12/19/2013  . Parkinson's disease (HCC) 08/06/2013  . Dyslipidemia 08/06/2013  . S/P mitral valve repair 08/06/2013    RINE,KATHRYN 02/02/2018, 11:43 AM Keene Breath, OTR/L [phone] Phone: [phone] 11:43 AM 02/02/18 [facility] [address] [place], [place], [address] Phone: [phone]   Fax:   [phone]  Name: Mikel Hardgrove MRN: [id number] Date of Birth: 1932/10/01

## 2018-02-08 ENCOUNTER — Ambulatory Visit: Payer: Medicare Other

## 2018-02-08 ENCOUNTER — Ambulatory Visit: Payer: Medicare Other | Admitting: Occupational Therapy

## 2018-02-08 DIAGNOSIS — R471 Dysarthria and anarthria: Secondary | ICD-10-CM

## 2018-02-08 DIAGNOSIS — R41841 Cognitive communication deficit: Secondary | ICD-10-CM

## 2018-02-08 DIAGNOSIS — R41840 Attention and concentration deficit: Secondary | ICD-10-CM

## 2018-02-08 DIAGNOSIS — R41842 Visuospatial deficit: Secondary | ICD-10-CM

## 2018-02-08 DIAGNOSIS — R278 Other lack of coordination: Secondary | ICD-10-CM

## 2018-02-08 DIAGNOSIS — R29898 Other symptoms and signs involving the musculoskeletal system: Secondary | ICD-10-CM

## 2018-02-08 DIAGNOSIS — R29818 Other symptoms and signs involving the nervous system: Secondary | ICD-10-CM

## 2018-02-08 NOTE — Patient Instructions (Signed)
   Compensating for focus/concentration:  - turn off all distractions (TV, radio, phone) - when listening, focus on the person's mouth - face the person you are talking with - repeat important information to yourself   ===================================================================  Memory Compensation Strategies  1. Use "WARM" strategy. W= write it down A=  associate it R=  repeat it M=  make a mental picture  2. You can keep a Glass blower/designerMemory Notebook. Use a 3-ring notebook with sections for the following:  calendar, important names and phone numbers, medications, doctors' names/phone numbers, "to do list"/reminders, and a section to journal what you did each day  3. Use a calendar to write appointments down.  4. Write yourself a schedule for the day.  This can be placed on the calendar or in a separate section of the Memory Notebook.  Keeping a regular schedule can help memory.  5. Use medication organizer with sections for each day or morning/evening pills  You may need help loading it  6. Keep a basket, or pegboard by the door.   Place items that you need to take out with you in the basket or on the pegboard.  You may also want to include a message board for reminders.  7. Use sticky notes. Place sticky notes with reminders in a place where the task is performed.  For example:  "turn off the stove" placed by the stove, "lock the door" placed on the door at eye level, "take your medications" on the bathroom mirror or by the place where you normally take your medications  8. Use alarms/timers.  Use while cooking to remind yourself to check on food or as a reminder to take your medicine, or as a reminder to make a call, or as a reminder to perform another task, etc.  9. Use a small tape recorder to record important information and notes for yourself.

## 2018-02-08 NOTE — Patient Instructions (Signed)
Coordination Exercises  Perform the following exercises for 20 minutes 1 times per day. Perform with both hand(s). Perform using big movements.  Flipping Cards: Place deck of cards on the table. Flip cards over by opening your hand big to grasp and then turn your palm up big. Deal cards: Hold 1/2 or whole deck in your hand. Use thumb to push card off top of deck with one big push. Rotate ball with fingertips: Pick up with fingers/thumb and move as much as you can with each turn/movement (clockwise and counter-clockwise). Pick up coins and stack one at a time: Pick up with big, intentional movements. Do not drag coin to the edge. (5-10 in a stack) Pick up 5-10 coins one at a time and hold in palm. Then, move coins from palm to fingertips one at a time to stack. Perform "Flicks"/hand stretches (PWR! Hands): Close hands then flick out your fingers with focus on opening hands, pulling wrists back, and extending elbows like you are pushing. 

## 2018-02-08 NOTE — Therapy (Signed)
[facility] [address] [place], [place], [address] Phone: [phone]   Fax:  [phone]  Occupational Therapy Treatment  Patient Details  Name: [name] [name] MRN: [id number] Date of Birth: 09/05/1932 Referring Provider: Dr. Rubin PayorSiddiqui   Encounter Date: 02/08/2018  OT End of Session - 02/08/18 0917    Visit Number  2    Number of Visits  11    Date for OT Re-Evaluation  03/11/18    Authorization Type  [employer] MCR    OT Start Time  0848    OT Stop Time  0930    OT Time Calculation (min)  42 min       Past Medical History:  Diagnosis Date  . Dyslipidemia   . Parkinson's disease (HCC)   . S/P mitral valve replacement    mitral regurg, endocarditis  . Stroke [facility](HCC)     Past Surgical History:  Procedure Laterality Date  . CATARACT EXTRACTION  2009, 2012   right 2009, left 2012  . LOOP RECORDER IMPLANT Left 03/13/2015   Procedure: LOOP RECORDER IMPLANT;  Surgeon: Marinus MawGregg W Taylor, MD;  [facility];  Service: Cardiovascular;  Laterality: Left;  . 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 TEE WITHOUT CARDIOVERSION N/A 03/13/2015   Procedure: TRANSESOPHAGEAL ECHOCARDIOGRAM (TEE);  Surgeon: Laurey Moralealton S McLean, MD;  [facility];  Service: Cardiovascular;  Laterality: N/A;  . TRANSTHORACIC ECHOCARDIOGRAM  08/15/2012   EF=>55%; normal LV systolic function; RV mildly dilated & systolic function mildly reduced; RA mod dilated; mild -mod MR & mildy increased gradients; mild TR; AV mildly sclerotic with mild-mod regurg    There were no vitals filed for this visit.  Subjective Assessment - 02/08/18 0850    Subjective   denies pain    Patient Stated Goals  maintain independence    Currently in Pain?  No/denies                           OT Education - 02/08/18 1648    Education provided  Yes    Education  Details  PWR! hands, coordination HEP, see pt instructions    Person(s) Educated  Patient;Spouse    Methods  Explanation;Demonstration;Verbal cues;Handout    Comprehension  Verbalized understanding;Returned demonstration;Verbal cues required min v/.c   min v/.c         OT Long Term Goals - 02/02/18 1121      OT LONG TERM GOAL #1   Title  Pt will verbalize understanding of strategies/AE to assist with ADLs/IADLs prn.--03/11/18    Time  5    Period  Weeks    Status  New    Target Date  03/11/18      OT LONG TERM GOAL #2   Title  Pt / wife will verbalize understanding of compensatory strategies for short term memory deficits and ways to keep thinking skills sharp.    Time  5    Period  Weeks    Status  New      OT LONG TERM GOAL #3   Title  Pt will be I with updated PD specific HEP following review    Time  5    Period  Weeks    Status  New      OT LONG TERM GOAL #4   Title  Pt will demonstrate  improved RUE fine motor coordination as evidenced by decreasing 9 hole peg test score by 3 secs    Baseline  RUE 39.56 secs, LUE 35.78 secs    Time  5    Period  Weeks    Status  New      OT LONG TERM GOAL #5   Title  Pt will demonstrate improved ease with dressing as evidenced by decreasing 3 button/ unbutton time to 58 secs or less.    Baseline  3 button/ unbutton 62 secs    Time  5    Period  Weeks    Status  New            Plan - 02/08/18 1649    Clinical Impression Statement  Pt is progressing towards goals. Pt/ wife verbalize understanding of coordination HEP.    Rehab Potential  Good    Current Impairments/barriers affecting progress:  cognitive deficits, decreased awareness of deficits    OT Frequency  2x / week    OT Duration  8 weeks    OT Treatment/Interventions  Self-care/ADL training;Therapeutic exercise;Visual/perceptual remediation/compensation;Patient/family education;Neuromuscular education;Moist Heat;Therapeutic activities;Cognitive  remediation/compensation;DME and/or AE instruction;Cryotherapy    Plan  short term memory strategies, fastening buttons, adapted strategies for ADLs.    Consulted and Agree with Plan of Care  Patient;Family member/caregiver    Family Member Consulted  wife       Patient will benefit from skilled therapeutic intervention in order to improve the following deficits and impairments:  Abnormal gait, Decreased coordination, Decreased range of motion, Difficulty walking, Impaired flexibility, Decreased safety awareness, Decreased endurance, Decreased activity tolerance, Decreased knowledge of precautions, Impaired tone, Impaired UE functional use, Pain, Decreased balance, Decreased cognition, Decreased mobility, Decreased strength, Impaired vision/preception  Visit Diagnosis: Other lack of coordination  Visuospatial deficit  Attention and concentration deficit  Other symptoms and signs involving the musculoskeletal system  Other symptoms and signs involving the nervous system    Problem List Patient Active Problem List   Diagnosis Date Noted  . Encounter for therapeutic drug monitoring 01/05/2016  . Paroxysmal atrial fibrillation (HCC) 12/24/2015  . 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 HLD (hyperlipidemia) 06/02/2015  . PD (Parkinson's disease) (HCC) 06/02/2015  . Mitral regurgitation 03/17/2015  . CVA (cerebral infarction)   . TIA (transient ischemic attack) 03/05/2015  . Slurred speech 03/05/2015  . Anxiety 12/19/2013  . Parkinson's disease (HCC) 08/06/2013  . Dyslipidemia 08/06/2013  . S/P mitral valve repair 08/06/2013    [name],[name] 02/08/2018, 4:51 PM Keene Breath, OTR/L [phone] Phone: [phone] 4:51 PM 02/08/18 [facility] [address] [place], [place], [address] Phone: [phone]   Fax:  [phone]  Name: [name] [name] MRN: [id number] Date  of Birth: [birth year]

## 2018-02-08 NOTE — Therapy (Signed)
[facility] [address] [place], [place], [address] Phone: [phone]   Fax:  [phone]  Speech Language Pathology Treatment  Patient Details  Name: [name] HensenJohn [name] MRN: [id number] Date of Birth: 06/21/1932 Referring Provider: Dr. Jaquita FoldsMustafa Siddiqui   Encounter Date: 02/08/2018  End of Session - 02/08/18 1107    Visit Number  2    Number of Visits  17    Date for SLP Re-Evaluation  04/07/18    SLP Start Time  0936    SLP Stop Time   1017    SLP Time Calculation (min)  41 min    Activity Tolerance  Patient tolerated treatment well       Past Medical History:  Diagnosis Date  . Dyslipidemia   . Parkinson's disease (HCC)   . S/P mitral valve replacement    mitral regurg, endocarditis  . Stroke [name] [name](HCC)     Past Surgical History:  Procedure Laterality Date  . CATARACT EXTRACTION  2009, 2012   right 2009, left 2012  . LOOP RECORDER IMPLANT Left 03/13/2015   Procedure: LOOP RECORDER IMPLANT;  Surgeon: Marinus MawGregg W Taylor, MD;  [facility];  Service: Cardiovascular;  Laterality: Left;  . 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 TEE WITHOUT CARDIOVERSION N/A 03/13/2015   Procedure: TRANSESOPHAGEAL ECHOCARDIOGRAM (TEE);  Surgeon: Laurey Moralealton S McLean, MD;  [facility];  Service: Cardiovascular;  Laterality: N/A;  . TRANSTHORACIC ECHOCARDIOGRAM  08/15/2012   EF=>55%; normal LV systolic function; RV mildly dilated & systolic function mildly reduced; RA mod dilated; mild -mod MR & mildy increased gradients; mild TR; AV mildly sclerotic with mild-mod regurg    There were no vitals filed for this visit.         ADULT SLP TREATMENT - 02/08/18 1000      General Information   Behavior/Cognition  Alert;Confused;Pleasant mood;Cooperative      Treatment Provided   Treatment provided  Cognitive-Linquistic      Cognitive-Linquistic Treatment   Treatment focused on  Patient/family/caregiver education;Cognition    Skilled Treatment  (home-management/ self-care) 30 min: Pt and wife were educated re: memory strategies and were provided multiple examples of bullet points in the"memory tips" handout (see "PT education"). Wife was most thankful for the suggestion of a memory notebook, inlcuding a calendar. Pt used to keep a calendar but has not done so recently due to memory deficits/cognitive deficits. Homework is to have pt fill out (or wife, with pt dictating due to handwriting deficits) a calendar with all appointments. (sp tx - individual <30 minutes): SLP used loud /a/ to recalibrate loudness to more WNL - pt req'd min A consistently for full breath and to use abdominal strength/push and produced upper 80s dB x5 reps. SLP had pt generate opposites (for more uncommon opposites e.g., empty/full, rough/smooth) and pt used WNL speech loudness 70% of the time. Min cues were rarely needed for generating the correct opposite.      Assessment / Recommendations / Plan   Plan  Continue with current plan of care      Progression Toward Goals   Progression toward goals  Progressing toward goals       SLP Education - 02/08/18 1106    Education provided  Yes    Education Details  memory tips handout and suggestions with this handout    Person(s) Educated  Patient;Spouse  Methods  Explanation;Demonstration;Handout    Comprehension  Verbalized understanding;Verbal cues required;Need further instruction       SLP Short Term Goals - 02/08/18 1108      SLP SHORT TERM GOAL #1   Title  pt will maintain loud /a/ average 90dB x 4 sessions    Time  4    Period  Weeks    Status  On-going      SLP SHORT TERM GOAL #2   Title  pt will demo 18/20 sentences with average low 70s dB x2 sessions    Time  4    Period  Weeks    Status  On-going      SLP SHORT TERM GOAL #3   Title  Pt will demonstrate WNL rate of speech  18/20 sentneces with occasional min A over 2 sessions    Time  4    Period  Weeks    Status  On-going      SLP SHORT TERM GOAL #4   Title  Pt and spouse will utilize 3 external aids and compensations for attention and memory with success (per their report) over 3 sessions.     Time  4    Period  Weeks    Status  On-going       SLP Long Term Goals - 02/08/18 1108      SLP LONG TERM GOAL #1   Title  pt will demo loudness average low 70s dB  in 10 minutes simple conversation with occasional cues x3 sessions    Time  8    Period  Weeks    Status  On-going      SLP LONG TERM GOAL #2   Title  Pt will be 90% intelligible over 8 minute simple conversation in mildly noisy environment over 8 minutes with occasional min A over 2 sessions    Time  8    Period  Weeks    Status  On-going      SLP LONG TERM GOAL #3   Title  Pt and spouse will carryover 4 compensations for reduced attention and memory over 3 sessions per their report.     Time  8    Period  Weeks    Status  On-going       Plan - 02/08/18 1107    Clinical Impression Statement  Pt presents with dysarthria and reduced cognition. His spouse reports pt with poor short term memory and poor attention, starting complex tasks, then not finishing them. SLP education today re: compensations for memory and concentration/attention. She also reports pt is having significant difficulty and frustration working on his computer.     Speech Therapy Frequency  2x / week    Treatment/Interventions  Internal/external aids;SLP instruction and feedback;Language facilitation;Compensatory techniques;Environmental controls;Multimodal communcation approach;Cueing hierarchy;Functional tasks;Patient/family education    Potential to Achieve Goals  Fair    Potential Considerations  Severity of impairments;Ability to learn/carryover information    Consulted and Agree with Plan of Care  Patient;Family member/caregiver       Patient will benefit from  skilled therapeutic intervention in order to improve the following deficits and impairments:   Dysarthria and anarthria  Cognitive communication deficit    Problem List Patient Active Problem List   Diagnosis Date Noted  . Encounter for therapeutic drug monitoring 01/05/2016  . Paroxysmal atrial fibrillation (HCC) 12/24/2015  . 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 HLD (hyperlipidemia) 06/02/2015  . PD (  Parkinson's disease) (HCC) 06/02/2015  . Mitral regurgitation 03/17/2015  . CVA (cerebral infarction)   . TIA (transient ischemic attack) 03/05/2015  . Slurred speech 03/05/2015  . Anxiety 12/19/2013  . Parkinson's disease (HCC) 08/06/2013  . Dyslipidemia 08/06/2013  . S/P mitral valve repair 08/06/2013    [facility] ,MS, CCC-SLP  02/08/2018, 11:09 AM  [facility] [address] [place], [place], [address] Phone: [phone]   Fax:  [phone]   Name: [name] [name] MRN: [id number] Date of Birth: 06-Aug-1932

## 2018-02-20 ENCOUNTER — Encounter: Payer: Self-pay | Admitting: Physical Therapy

## 2018-02-20 ENCOUNTER — Ambulatory Visit: Payer: Medicare Other | Admitting: Speech Pathology

## 2018-02-20 ENCOUNTER — Ambulatory Visit: Payer: Medicare Other | Attending: Neurology | Admitting: Physical Therapy

## 2018-02-20 DIAGNOSIS — R278 Other lack of coordination: Secondary | ICD-10-CM | POA: Insufficient documentation

## 2018-02-20 DIAGNOSIS — R41841 Cognitive communication deficit: Secondary | ICD-10-CM | POA: Insufficient documentation

## 2018-02-20 DIAGNOSIS — R471 Dysarthria and anarthria: Secondary | ICD-10-CM | POA: Insufficient documentation

## 2018-02-20 DIAGNOSIS — R41840 Attention and concentration deficit: Secondary | ICD-10-CM | POA: Diagnosis present

## 2018-02-20 DIAGNOSIS — R2681 Unsteadiness on feet: Secondary | ICD-10-CM | POA: Insufficient documentation

## 2018-02-20 DIAGNOSIS — R41842 Visuospatial deficit: Secondary | ICD-10-CM | POA: Diagnosis present

## 2018-02-20 DIAGNOSIS — R29898 Other symptoms and signs involving the musculoskeletal system: Secondary | ICD-10-CM | POA: Insufficient documentation

## 2018-02-20 DIAGNOSIS — R2689 Other abnormalities of gait and mobility: Secondary | ICD-10-CM | POA: Diagnosis present

## 2018-02-20 DIAGNOSIS — R29818 Other symptoms and signs involving the nervous system: Secondary | ICD-10-CM | POA: Diagnosis present

## 2018-02-20 DIAGNOSIS — R293 Abnormal posture: Secondary | ICD-10-CM | POA: Insufficient documentation

## 2018-02-20 NOTE — Patient Instructions (Addendum)
Try using your calendar at a scheduled time each day. If you're having trouble remembering to use it, make it a part of a routine you're already doing.   In the morning, look at your calendar either before or after you do your PT exercises.  At night, do the same thing when you brush your teeth.   Remember to do your loud "Ahhhhh" 5 times, twice a day.

## 2018-02-20 NOTE — Therapy (Signed)
[facility] [address] [place], [place], [address] Phone: [phone]   Fax:  [phone]  Physical Therapy Evaluation  Patient Details  Name: [name] [name] MRN: [id number] Date of Birth: 31-Jul-1932 Referring Provider: Dr. Rubin Payor   Encounter Date: 02/20/2018  PT End of Session - 02/20/18 0941    Visit Number  1    Number of Visits  10    Date for PT Re-Evaluation  04/21/18    Authorization Type  [employer] Medicare    PT Start Time  [id number]    PT Stop Time  0930    PT Time Calculation (min)  41 min    Activity Tolerance  Patient tolerated treatment well    Behavior During Therapy  [facility] for tasks assessed/performed       Past Medical History:  Diagnosis Date  . Dyslipidemia   . Parkinson's disease (HCC)   . S/P mitral valve replacement    mitral regurg, endocarditis  . Stroke [facility])     Past Surgical History:  Procedure Laterality Date  . CATARACT EXTRACTION  2009, 2012   right 2009, left 2012  . LOOP RECORDER IMPLANT Left 03/13/2015   Procedure: LOOP RECORDER IMPLANT;  Surgeon: Marinus Maw, MD;  Location: [facility] CATH LAB;  Service: Cardiovascular;  Laterality: Left;  . 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 TEE WITHOUT CARDIOVERSION N/A 03/13/2015   Procedure: TRANSESOPHAGEAL ECHOCARDIOGRAM (TEE);  Surgeon: Laurey Morale, MD;  Location: [facility] ENDOSCOPY;  Service: Cardiovascular;  Laterality: N/A;  . TRANSTHORACIC ECHOCARDIOGRAM  08/15/2012   EF=>55%; normal LV systolic function; RV mildly dilated & systolic function mildly reduced; RA mod dilated; mild -mod MR & mildy increased gradients; mild TR; AV mildly sclerotic with mild-mod regurg    There were no vitals filed for this visit.   Subjective Assessment - 02/20/18 0853    Subjective  Describes stumble steps upon starting trying to walk, thresholds, changing directions, doorways.   Turning is bad; no falls in the past 6 months.    Patient is accompained by:  Family member wife    Patient Stated Goals  Pt's goals for therapy include how to start up walking.    Currently in Pain?  No/denies         [facility] PT Assessment - 02/20/18 0001      Assessment   Medical Diagnosis  Parkinson's     Referring Provider  Dr. Rubin Payor    Onset Date/Surgical Date  02/02/18 referral date      Precautions   Precautions  Fall      Balance Screen   Has the patient fallen in the past 6 months  No    Has the patient had a decrease in activity level because of a fear of falling?   No    Is the patient reluctant to leave their home because of a fear of falling?   No      Home Environment   Living Environment  Private residence    Living Arrangements  Spouse/significant other    Available Help at Discharge  Family    Type of Home  House    Home Access  Stairs to enter one step to enter    Entrance Stairs-Number of Steps  1    Entrance Stairs-Rails  None    Home Layout  Able to live on main level with  bedroom/bathroom But does go up to second level     Home Public affairs consultantquipment  Cane - single point    Additional Comments  Has [place] house they are trying to sell, so they are there doing work occasionally      Prior Function   Level of Independence  Independent    Leisure  travel, Crestonyardwork; does sets of exercises at home daily      Observation/Other Assessments   Focus on Therapeutic Outcomes (FOTO)   NA      Posture/Postural Control   Posture/Postural Control  Postural limitations    Postural Limitations  Forward head;Rounded Shoulders    Posture Comments  Dyskinesias in trunk increase with stressors, busyness      ROM / Strength   AROM / PROM / Strength  Strength      Strength   Overall Strength  Deficits    Overall Strength Comments  Grossly tested at least 4/5 bilateral lower extremities      Transfers   Transfers  Sit to Stand;Stand to Sit    Sit to Stand  6: Modified  independent (Device/Increase time);Without upper extremity assist;From chair/3-in-1    Five time sit to stand comments   10.04    Stand to Sit  6: Modified independent (Device/Increase time);Without upper extremity assist;To chair/3-in-1      Ambulation/Gait   Ambulation/Gait  Yes    Ambulation/Gait Assistance  5: Supervision    Ambulation Distance (Feet)  200 Feet    Assistive device  -- No device during eval, but has cane    Gait Pattern  Step-through pattern;Decreased arm swing - right;Decreased arm swing - left;Decreased step length - right;Decreased step length - left;Trunk flexed;Narrow base of support;Poor foot clearance - left;Poor foot clearance - right    Ambulation Surface  Level;Indoor    Gait velocity  10.09 sec= 3.25 ft/sec    Gait velocity - backwards  8.59 sec in 20 ft = 2.32 ft/sec with small, fast steps    Gait Comments  Wife reports pt is "showing off" for PT-he does not usually move this well and his posture is less forward flexed today than normal      Standardized Balance Assessment   Standardized Balance Assessment  Timed Up and Go Test      Timed Up and Go Test   Normal TUG (seconds)  11.62    Manual TUG (seconds)  12.25    Cognitive TUG (seconds)  11.15    TUG Comments  Scores >13.5-15 sec indicate increased fall risk      High Level Balance   High Level Balance Comments  MiniBESTest:  16/28 (see note for full details)-score has decreased since PT d/c 09/2016 score of 26/28          Mini-BESTest: Balance Evaluation Systems Test  2005-2013 [facility]. All rights reserved. ________________________________________________________________________________________Anticipatory_________Subscore___3__/6 1. SIT TO STAND Instruction: "Cross your arms across your chest. Try not to use your hands unless you must.Do not let your legs lean against the back of the chair when you stand. Please stand up now." X(2) Normal: Comes to stand without  use of hands and stabilizes independently. (1) Moderate: Comes to stand WITH use of hands on first attempt. (0) Severe: Unable to stand up from chair without assistance, OR needs several attempts with use of hands. 2. RISE TO TOES Instruction: "Place your feet shoulder width apart. Place your hands on your hips. Try to rise as high as you can onto your  toes. I will count out loud to 3 seconds. Try to hold this pose for at least 3 seconds. Look straight ahead. Rise now." (2) Normal: Stable for 3 s with maximum height. (1) Moderate: Heels up, but not full range (smaller than when holding hands), OR noticeable instability for 3 s. X(0) Severe: < 3 s. 3. STAND ON ONE LEG Instruction: "Look straight ahead. Keep your hands on your hips. Lift your leg off of the ground behind you without touching or resting your raised leg upon your other standing leg. Stay standing on one leg as long as you can. Look straight ahead. Lift now." Left: Time in Seconds Trial 1:_4.09____Trial 2:__8.31___ (2) Normal: 20 s. X(1) Moderate: < 20 s. (0) Severe: Unable. Right: Time in Seconds Trial 1:_6.62____Trial 2:_2.13____ (2) Normal: 20 s. X(1) Moderate: < 20 s. (0) Severe: Unable To score each side separately use the trial with the longest time. To calculate the sub-score and total score use the side [left or right] with the lowest numerical score [i.e. the worse side]. ______________________________________________________________________________________Reactive Postural Control___________Subscore:__0___/6 4. COMPENSATORY STEPPING CORRECTION- FORWARD Instruction: "Stand with your feet shoulder width apart, arms at your sides. Lean forward against my hands beyond your forward limits. When I let go, do whatever is necessary, including taking a step, to avoid a fall." (2) Normal: Recovers independently with a single, large step (second realignment step is allowed). (1) Moderate: More than one step used to recover  equilibrium. X(0) Severe: No step, OR would fall if not caught, OR falls spontaneously. 5. COMPENSATORY STEPPING CORRECTION- BACKWARD Instruction: "Stand with your feet shoulder width apart, arms at your sides. Lean backward against my hands beyond your backward limits. When I let go, do whatever is necessary, including taking a step, to avoid a fall." (2) Normal: Recovers independently with a single, large step. (1) Moderate: More than one step used to recover equilibrium. X(0) Severe: No step, OR would fall if not caught, OR falls spontaneously. 6. COMPENSATORY STEPPING CORRECTION- LATERAL Instruction: "Stand with your feet together, arms down at your sides. Lean into my hand beyond your sideways limit. When I let go, do whatever is necessary, including taking a step, to avoid a fall." Left (2) Normal: Recovers independently with 1 step (crossover or lateral OK). (1) Moderate: Several steps to recover equilibrium. X(0) Severe: Falls, or cannot step. Right (2) Normal: Recovers independently with 1 step (crossover or lateral OK). (1) Moderate: Several steps to recover equilibrium. X(0) Severe: Falls, or cannot step. Use the side with the lowest score to calculate sub-score and total score. ____________________________________________________________________________________Sensory Orientation_____________Subscore:______6___/6 7. STANCE (FEET TOGETHER); EYES OPEN, FIRM SURFACE Instruction: "Place your hands on your hips. Place your feet together until almost touching. Look straight ahead. Be as stable and still as possible, until I say stop." Time in seconds:________ X(2) Normal: 30 s. (1) Moderate: < 30 s. (0) Severe: Unable. 8. STANCE (FEET TOGETHER); EYES CLOSED, FOAM SURFACE Instruction: "Step onto the foam. Place your hands on your hips. Place your feet together until almost touching. Be as stable and still as possible, until I say stop. I will start timing when you close your  eyes." Time in seconds:________ X(2) Normal: 30 s. (1) Moderate: < 30 s. (0) Severe: Unable. 9. INCLINE- EYES CLOSED Instruction: "Step onto the incline ramp. Please stand on the incline ramp with your toes toward the top. Place your feet shoulder width apart and have your arms down at your sides. I will start timing when you close your eyes."  Time in seconds:________ X(2) Normal: Stands independently 30 s and aligns with gravity. (1) Moderate: Stands independently <30 s OR aligns with surface. (0) Severe: Unable. _________________________________________________________________________________________Dynamic Gait ______Subscore_____7___/10 10. CHANGE IN GAIT SPEED Instruction: "Begin walking at your normal speed, when I tell you 'fast', walk as fast as you can. When I say 'slow', walk very slowly." X(2) Normal: Significantly changes walking speed without imbalance. (1) Moderate: Unable to change walking speed or signs of imbalance. (0) Severe: Unable to achieve significant change in walking speed AND signs of imbalance. 11. WALK WITH HEAD TURNS - HORIZONTAL Instruction: "Begin walking at your normal speed, when I say "right", turn your head and look to the right. When I say "left" turn your head and look to the left. Try to keep yourself walking in a straight line." X(2) Normal: performs head turns with no change in gait speed and good balance. (1) Moderate: performs head turns with reduction in gait speed. (0) Severe: performs head turns with imbalance. 12. WALK WITH PIVOT TURNS Instruction: "Begin walking at your normal speed. When I tell you to 'turn and stop', turn as quickly as you can, face the opposite direction, and stop. After the turn, your feet should be close together." (2) Normal: Turns with feet close FAST (< 3 steps) with good balance. (1) Moderate: Turns with feet close SLOW (>4 steps) with good balance. X(0) Severe: Cannot turn with feet close at any speed without  imbalance. 13. STEP OVER OBSTACLES Instruction: "Begin walking at your normal speed. When you get to the box, step over it, not around it and keep walking." (2) Normal: Able to step over box with minimal change of gait speed and with good balance. X(1) Moderate: Steps over box but touches box OR displays cautious behavior by slowing gait. (0) Severe: Unable to step over box OR steps around box. 14. TIMED UP & GO WITH DUAL TASK [3 METER WALK] Instruction TUG: "When I say 'Go', stand up from chair, walk at your normal speed across the tape on the floor, turn around, and come back to sit in the chair." Instruction TUG with Dual Task: "Count backwards by threes starting at ___. When I say 'Go', stand up from chair, walk at your normal speed across the tape on the floor, turn around, and come back to sit in the chair. Continue counting backwards the entire time." TUG: ________seconds; Dual Task TUG: ________seconds X(2) Normal: No noticeable change in sitting, standing or walking while backward counting when compared to TUG without Dual Task. (1) Moderate: Dual Task affects either counting OR walking (>10%) when compared to the TUG without Dual Task. (0) Severe: Stops counting while walking OR stops walking while counting. When scoring item 14, if subject's gait speed slows more than 10% between the TUG without and with a Dual Task the score should be decreased by a point. TOTAL SCORE: ___16_____/28     No data recorded  Objective measurements completed on examination: See above findings.                   PT Long Term Goals - 02/20/18 0950      PT LONG TERM GOAL #1   Title  Pt/wife will verbalize understanding of fall prevention within home environment. TARGET 03/24/18    Time  5    Period  Weeks    Status  New    Target Date  03/24/18      PT LONG TERM GOAL #2   Title  Pt will improve MiniBESTest to at least 20/28 for improved balance/decreased fall risk.    Time  5     Period  Weeks    Status  New    Target Date  03/24/18      PT LONG TERM GOAL #3   Title  Pt/wife will verbalize understanding of tips to reduce freezing episodes with gait and turns.    Time  5    Period  Weeks    Status  New    Target Date  03/24/18      PT LONG TERM GOAL #4   Title  Pt will perform HEP with wife's supervision, for improved balance, functional mobility and posture.    Time  5    Period  Weeks    Status  New    Target Date  03/24/18             Plan - 02/20/18 0943    Clinical Impression Statement  Pt is an 84 year old male with history of Parkinson's disease, known to this therapist from previous bouts of PT, last being 09/2016.  Pt presents to OP PT with reports of difficulty initiating gait without stutter steps and also difficulty with thresholds, doorways and turns.  He has had no falls.  He presents with decreased balance, decreased postural stability, decreased timing and coordination of gait, abnormal posture, dyskinesias, festinating at times, but hastening with gait in posterior direction.  Pt would benefit from skilled PT to address the above stated deficits to decrease fall risk and improve functional mobility and independence.    History and Personal Factors relevant to plan of care:  PMH >3 co-morbidities (s/p mitral valve replacement, dyslipidemia, cataract surgery, stroke, PD)    Clinical Presentation  Stable    Clinical Presentation due to:  fall risk per MiniBestest    Clinical Decision Making  Low    Rehab Potential  Good    PT Frequency  2x / week    PT Duration  Other (comment) 5 weeks    PT Treatment/Interventions  ADLs/Self Care Home Management;DME Instruction;Gait training;Stair training;Functional mobility training;Therapeutic activities;Therapeutic exercise;Balance training;Patient/family education;Neuromuscular re-education    PT Next Visit Plan  Initiate balance activities as part of HEP, including hip, ankle, step strategy; formally  check what patient is doing at home for HEP    Recommended Other Services  Please note that PT offered to schedule beyond 03/09/18 scheduled appt, and pt/wife would like to work for 3 weeks and then see what is needed-    Consulted and Agree with Plan of Care  Patient;Family member/caregiver    Family Member Consulted  wife       Patient will benefit from skilled therapeutic intervention in order to improve the following deficits and impairments:  Abnormal gait, Decreased balance, Decreased mobility, Decreased safety awareness, Decreased strength, Difficulty walking, Postural dysfunction  Visit Diagnosis: Other abnormalities of gait and mobility  Unsteadiness on feet  Abnormal posture  Other symptoms and signs involving the nervous system     Problem List Patient Active Problem List   Diagnosis Date Noted  . Encounter for therapeutic drug monitoring 01/05/2016  . Paroxysmal atrial fibrillation (HCC) 12/24/2015  . 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 HLD (hyperlipidemia) 06/02/2015  . PD (Parkinson's disease) (HCC) 06/02/2015  . Mitral regurgitation 03/17/2015  . CVA (cerebral infarction)   . TIA (transient ischemic attack) 03/05/2015  . Slurred speech 03/05/2015  . Anxiety  12/19/2013  . Parkinson's disease (HCC) 08/06/2013  . Dyslipidemia 08/06/2013  . S/P mitral valve repair 08/06/2013    Shreena Baines W. 02/20/2018, 9:52 AM  Gean Maidens., PT   [facility] [address] [place], [place], [address] Phone: [phone]   Fax:  [phone]  Name: [name] [name] MRN: [id number] Date of Birth: Feb 16, 1932

## 2018-02-20 NOTE — Therapy (Signed)
[facility] [address] [place], [place], [address] Phone: [phone]   Fax:  [phone]  Speech Language Pathology Treatment  Patient Details  Name: [name] HensenJohn [name] MRN: [id number] Date of Birth: 02/21/1932 Referring Provider: Dr. Jaquita FoldsMustafa Siddiqui   Encounter Date: 02/20/2018  End of Session - 02/20/18 1441    Visit Number  3    Number of Visits  17    Date for SLP Re-Evaluation  04/07/18    SLP Start Time  0800    SLP Stop Time   0845    SLP Time Calculation (min)  45 min    Activity Tolerance  Patient tolerated treatment well       Past Medical History:  Diagnosis Date  . Dyslipidemia   . Parkinson's disease (HCC)   . S/P mitral valve replacement    mitral regurg, endocarditis  . Stroke [facility](HCC)     Past Surgical History:  Procedure Laterality Date  . CATARACT EXTRACTION  2009, 2012   right 2009, left 2012  . LOOP RECORDER IMPLANT Left 03/13/2015   Procedure: LOOP RECORDER IMPLANT;  Surgeon: Marinus MawGregg W Taylor, MD;  [facility];  Service: Cardiovascular;  Laterality: Left;  . 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 TEE WITHOUT CARDIOVERSION N/A 03/13/2015   Procedure: TRANSESOPHAGEAL ECHOCARDIOGRAM (TEE);  Surgeon: Laurey Moralealton S McLean, MD;  [facility];  Service: Cardiovascular;  Laterality: N/A;  . TRANSTHORACIC ECHOCARDIOGRAM  08/15/2012   EF=>55%; normal LV systolic function; RV mildly dilated & systolic function mildly reduced; RA mod dilated; mild -mod MR & mildy increased gradients; mild TR; AV mildly sclerotic with mild-mod regurg    There were no vitals filed for this visit.         ADULT SLP TREATMENT - 02/20/18 0800      General Information   Behavior/Cognition  Alert;Confused;Pleasant mood;Cooperative    Patient Positioning  Upright in chair      Treatment Provided   Treatment provided   Cognitive-Linquistic      Pain Assessment   Pain Assessment  No/denies pain      Cognitive-Linquistic Treatment   Treatment focused on  Patient/family/caregiver education;Cognition;Dysarthria    Skilled Treatment  Pt did not bring calendar with appointments; wife reports inconsistent use. SLP provided education and suggested strategies including use of existing routines to build habit of using his calendar. Pt to attempt checking his calendar twice per day, after PT exercises and at night after brushing his teeth. (home management/self-care 15 min). SLP used loud /a/ and pt's perceived effort level to recalibrate loudness to more WNL - pt req'd min A, demonstration occasionally for full breath and to use abdominal strength/push and produced low 90s dB x5 reps. SLP engaged pt in word-level tasks with increased cognitive load pt used WNL (~75dB) speech loudness 70% of the time, occasional min A required otherwise. In simple conversation between tasks, SLP used cue "think shout", frequent min-mod A required. Cues also required for slow rate, as pt with short rushes of speech and decreased breath support. For simple sentences 5-7 words, pt used WNL loudness (average 73 dB), rare min A for slow rate.      Assessment / Recommendations / Plan   Plan  Continue with current plan of care      Progression Toward Goals   Progression toward goals  Progressing toward goals  SLP Education - 02/20/18 1440    Education provided  Yes    Education Details  Make calendar use a habit by pairing with an existing successful routine    Person(s) Educated  Patient;Spouse    Methods  Explanation;Handout    Comprehension  Verbalized understanding       SLP Short Term Goals - 02/20/18 1442      SLP SHORT TERM GOAL #1   Title  pt will maintain loud /a/ average 90dB x 4 sessions    Time  3    Period  Weeks    Status  On-going      SLP SHORT TERM GOAL #2   Title  pt will demo 18/20 sentences with average  low 70s dB x2 sessions    Time  3    Period  Weeks    Status  On-going      SLP SHORT TERM GOAL #3   Title  Pt will demonstrate WNL rate of speech 18/20 sentneces with occasional min A over 2 sessions    Time  3    Period  Weeks    Status  On-going      SLP SHORT TERM GOAL #4   Title  Pt and spouse will utilize 3 external aids and compensations for attention and memory with success (per their report) over 3 sessions.     Time  3    Period  Weeks    Status  On-going       SLP Long Term Goals - 02/20/18 1442      SLP LONG TERM GOAL #1   Title  pt will demo loudness average low 70s dB  in 10 minutes simple conversation with occasional cues x3 sessions    Time  7    Period  Weeks    Status  On-going      SLP LONG TERM GOAL #2   Title  Pt will be 90% intelligible over 8 minute simple conversation in mildly noisy environment over 8 minutes with occasional min A over 2 sessions    Time  7    Period  Weeks    Status  On-going      SLP LONG TERM GOAL #3   Title  Pt and spouse will carryover 4 compensations for reduced attention and memory over 3 sessions per their report.     Time  7    Period  Weeks    Status  On-going       Plan - 02/20/18 1441    Clinical Impression Statement  Pt presents with dysarthria and reduced cognition. His spouse reports pt with poor short term memory and poor attention, starting complex tasks, then not finishing them. SLP education today re: compensations for memory and concentration/attention. Continue skilled ST to maximize cognitive and communicative function.    Speech Therapy Frequency  2x / week    Treatment/Interventions  Internal/external aids;SLP instruction and feedback;Language facilitation;Compensatory techniques;Environmental controls;Multimodal communcation approach;Cueing hierarchy;Functional tasks;Patient/family education    Potential to Achieve Goals  Fair    Potential Considerations  Severity of impairments;Ability to learn/carryover  information    Consulted and Agree with Plan of Care  Patient;Family member/caregiver       Patient will benefit from skilled therapeutic intervention in order to improve the following deficits and impairments:   Cognitive communication deficit  Dysarthria and anarthria    Problem List Patient Active Problem List   Diagnosis Date Noted  . Encounter for therapeutic drug monitoring  01/05/2016  . Paroxysmal atrial fibrillation (HCC) 12/24/2015  . 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 HLD (hyperlipidemia) 06/02/2015  . PD (Parkinson's disease) (HCC) 06/02/2015  . Mitral regurgitation 03/17/2015  . CVA (cerebral infarction)   . TIA (transient ischemic attack) 03/05/2015  . Slurred speech 03/05/2015  . Anxiety 12/19/2013  . Parkinson's disease (HCC) 08/06/2013  . Dyslipidemia 08/06/2013  . S/P mitral valve repair 08/06/2013   Rondel Baton, MS, CCC-SLP Speech-Language Pathologist  Arlana Lindau 02/20/2018, 2:43 PM  [facility] [address] [place], [place], [address] Phone: [phone]   Fax:  [phone]   Name: Jakarius Flamenco MRN: [id number] Date of Birth: [birth year]

## 2018-02-22 ENCOUNTER — Ambulatory Visit: Payer: Medicare Other | Admitting: Occupational Therapy

## 2018-02-22 ENCOUNTER — Ambulatory Visit: Payer: Medicare Other | Admitting: Physical Therapy

## 2018-02-22 ENCOUNTER — Ambulatory Visit: Payer: Medicare Other

## 2018-02-22 ENCOUNTER — Encounter: Payer: Self-pay | Admitting: Physical Therapy

## 2018-02-22 DIAGNOSIS — R41840 Attention and concentration deficit: Secondary | ICD-10-CM

## 2018-02-22 DIAGNOSIS — R41842 Visuospatial deficit: Secondary | ICD-10-CM

## 2018-02-22 DIAGNOSIS — R2689 Other abnormalities of gait and mobility: Secondary | ICD-10-CM

## 2018-02-22 DIAGNOSIS — R278 Other lack of coordination: Secondary | ICD-10-CM

## 2018-02-22 DIAGNOSIS — R471 Dysarthria and anarthria: Secondary | ICD-10-CM

## 2018-02-22 DIAGNOSIS — R293 Abnormal posture: Secondary | ICD-10-CM

## 2018-02-22 DIAGNOSIS — R2681 Unsteadiness on feet: Secondary | ICD-10-CM

## 2018-02-22 DIAGNOSIS — R41841 Cognitive communication deficit: Secondary | ICD-10-CM

## 2018-02-22 DIAGNOSIS — R29818 Other symptoms and signs involving the nervous system: Secondary | ICD-10-CM

## 2018-02-22 NOTE — Therapy (Signed)
[facility] [address] [place], [place], [address] Phone: [phone]   Fax:  [phone]  Physical Therapy Treatment  Patient Details  Name: [name] [name] MRN: [id number] Date of Birth: 1932-02-11 Referring Provider: Dr. Rubin Payor   Encounter Date: 02/22/2018  PT End of Session - 02/22/18 2135    Visit Number  2    Number of Visits  10    Date for PT Re-Evaluation  04/21/18    Authorization Type  [employer] Medicare    PT Start Time  0934    PT Stop Time  1016    PT Time Calculation (min)  42 min    Activity Tolerance  Patient tolerated treatment well    Behavior During Therapy  [facility] for tasks assessed/performed       Past Medical History:  Diagnosis Date  . Dyslipidemia   . Parkinson's disease (HCC)   . S/P mitral valve replacement    mitral regurg, endocarditis  . Stroke [facility])     Past Surgical History:  Procedure Laterality Date  . CATARACT EXTRACTION  2009, 2012   right 2009, left 2012  . LOOP RECORDER IMPLANT Left 03/13/2015   Procedure: LOOP RECORDER IMPLANT;  Surgeon: Marinus Maw, MD;  [facility];  Service: Cardiovascular;  Laterality: Left;  . 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 TEE WITHOUT CARDIOVERSION N/A 03/13/2015   Procedure: TRANSESOPHAGEAL ECHOCARDIOGRAM (TEE);  Surgeon: Laurey Morale, MD;  [facility] ENDOSCOPY;  Service: Cardiovascular;  Laterality: N/A;  . TRANSTHORACIC ECHOCARDIOGRAM  08/15/2012   EF=>55%; normal LV systolic function; RV mildly dilated & systolic function mildly reduced; RA mod dilated; mild -mod MR & mildy increased gradients; mild TR; AV mildly sclerotic with mild-mod regurg    There were no vitals filed for this visit.  Subjective Assessment - 02/22/18 0936    Subjective  No changes since eval.  His HEP currently consists of:  supine bridging, PWR! Rock, quadruped [employer]!  Twist, deep knee bends, standing PWR! Rock and reach    Patient is accompained by:  Family member wife    Patient Stated Goals  Pt's goals for therapy include how to start up walking.    Currently in Pain?  No/denies                       [facility] Adult PT Treatment/Exercise - 02/22/18 0001      Transfers   Transfers  Sit to Stand;Stand to Sit    Sit to Stand  6: Modified independent (Device/Increase time);Without upper extremity assist;From bed    Stand to Sit  6: Modified independent (Device/Increase time);Without upper extremity assist;To bed    Number of Reps  10 reps with cues for PWR! Up posture upon standing    Transfer Cueing  Cues provided for correct transfer technique, including wide BOS and pause in upright standing to rock side to side, then start with BIG step to start gait.  Practiced this sequence an additiona 5-10 reps throughout session, with cues needed to remind patient to slow down.      Ambulation/Gait   Ambulation/Gait  Yes    Ambulation/Gait Assistance  5: Supervision    Ambulation/Gait Assistance Details  Short distance bouts of gait, practicing tips to reduce freezing epsiodes, with STOP with wide BOS, then rock and BIG step to start, with practice  using visual target to get negotiate doorways x 3 reps.    Pre-Gait Activities  Provided handout and discussed, then practiced tips to reduce freezing episodes with gait and turns    Gait Comments  Practiced wide BOS rocking turns, at least 5 reps, with cues for widened BOS and slowed pace          Balance Exercises - 02/22/18 0939      Balance Exercises: Standing   Wall Bumps  Hip    Wall Bumps-Hips  Eyes opened;Anterior/posterior;10 reps 2 sets    Stepping Strategy  Anterior;Posterior;Lateral;10 reps;UE support 1 set same leg; then 1 set alternating legs    Heel Raises Limitations  2 sets x 10    Toe Raise Limitations  2 sets x 10     Cues provided for slowed pace, and for upright posture  throughout.   PT Education - 02/22/18 2134    Education provided  Yes    Education Details  Sit to stand technique, tips to reduce freezing episodes with gait and turns    Person(s) Educated  Patient;Spouse    Methods  Explanation;Demonstration;Verbal cues;Handout    Comprehension  Verbalized understanding;Returned demonstration;Verbal cues required;Need further instruction          PT Long Term Goals - 02/20/18 0950      PT LONG TERM GOAL #1   Title  Pt/wife will verbalize understanding of fall prevention within home environment. TARGET 03/24/18    Time  5    Period  Weeks    Status  New    Target Date  03/24/18      PT LONG TERM GOAL #2   Title  Pt will improve MiniBESTest to at least 20/28 for improved balance/decreased fall risk.    Time  5    Period  Weeks    Status  New    Target Date  03/24/18      PT LONG TERM GOAL #3   Title  Pt/wife will verbalize understanding of tips to reduce freezing episodes with gait and turns.    Time  5    Period  Weeks    Status  New    Target Date  03/24/18      PT LONG TERM GOAL #4   Title  Pt will perform HEP with wife's supervision, for improved balance, functional mobility and posture.    Time  5    Period  Weeks    Status  New    Target Date  03/24/18            Plan - 02/22/18 2135    Clinical Impression Statement  Skilled PT session focused on transfer training, tips to reduce freezing episodes with gait and turns, and balance strategy work.  Pt needs reminder cues throughout to slow pace with large, deliberate movements.    Rehab Potential  Good    PT Frequency  2x / week    PT Duration  Other (comment) 5 weeks    PT Treatment/Interventions  ADLs/Self Care Home Management;DME Instruction;Gait training;Stair training;Functional mobility training;Therapeutic activities;Therapeutic exercise;Balance training;Patient/family education;Neuromuscular re-education    PT Next Visit Plan  Initiate balance activities as part of  HEP, including hip, ankle, step strategy; review sit<>stand and tips to reduce freezing episodes with gait    Consulted and Agree with Plan of Care  Patient;Family member/caregiver    Family Member Consulted  wife       Patient will benefit from skilled therapeutic intervention in order  to improve the following deficits and impairments:  Abnormal gait, Decreased balance, Decreased mobility, Decreased safety awareness, Decreased strength, Difficulty walking, Postural dysfunction  Visit Diagnosis: Unsteadiness on feet  Abnormal posture  Other abnormalities of gait and mobility     Problem List Patient Active Problem List   Diagnosis Date Noted  . Encounter for therapeutic drug monitoring 01/05/2016  . Paroxysmal atrial fibrillation (HCC) 12/24/2015  . 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 HLD (hyperlipidemia) 06/02/2015  . PD (Parkinson's disease) (HCC) 06/02/2015  . Mitral regurgitation 03/17/2015  . CVA (cerebral infarction)   . TIA (transient ischemic attack) 03/05/2015  . Slurred speech 03/05/2015  . Anxiety 12/19/2013  . Parkinson's disease (HCC) 08/06/2013  . Dyslipidemia 08/06/2013  . S/P mitral valve repair 08/06/2013    Ryett Hamman W. 02/22/2018, 9:38 PM  Gean MaidensMARRIOTT,Earland Reish W., PT   [facility] [address] [place], [place], [address] Phone: [phone]   Fax:  [phone]  Name: [name] [name] MRN: [id number] Date of Birth: 03/28/1932

## 2018-02-22 NOTE — Therapy (Signed)
[facility] [address] [place], [place], [address] Phone: [phone]   Fax:  [phone]  Speech Language Pathology Treatment  Patient Details  Name: [name] [name] MRN: [id number] Date of Birth: [birth year] Referring Provider: Dr. Jaquita Folds   Encounter Date: 02/22/2018  End of Session - 02/22/18 1706    Visit Number  4    Number of Visits  17    Date for SLP Re-Evaluation  04/07/18    SLP Start Time  1103    SLP Stop Time   1145    SLP Time Calculation (min)  42 min    Activity Tolerance  Patient tolerated treatment well       Past Medical History:  Diagnosis Date  . Dyslipidemia   . Parkinson's disease (HCC)   . S/P mitral valve replacement    mitral regurg, endocarditis  . Stroke [facility])     Past Surgical History:  Procedure Laterality Date  . CATARACT EXTRACTION  2009, 2012   right 2009, left 2012  . LOOP RECORDER IMPLANT Left 03/13/2015   Procedure: LOOP RECORDER IMPLANT;  Surgeon: Marinus Maw, MD;  Location: [facility] CATH LAB;  Service: Cardiovascular;  Laterality: Left;  . 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 TEE WITHOUT CARDIOVERSION N/A 03/13/2015   Procedure: TRANSESOPHAGEAL ECHOCARDIOGRAM (TEE);  Surgeon: Laurey Morale, MD;  Location: [facility] ENDOSCOPY;  Service: Cardiovascular;  Laterality: N/A;  . TRANSTHORACIC ECHOCARDIOGRAM  08/15/2012   EF=>55%; normal LV systolic function; RV mildly dilated & systolic function mildly reduced; RA mod dilated; mild -mod MR & mildy increased gradients; mild TR; AV mildly sclerotic with mild-mod regurg    There were no vitals filed for this visit.  Subjective Assessment - 02/22/18 1116    Subjective  "I had - ah - balance - that would be - - physical (therapy), and the - - - occupational." (pt, re: what therapies he's had today)    Patient is accompained by:  Family member  wife    Currently in Pain?  No/denies            ADULT SLP TREATMENT - 02/22/18 1119      General Information   Behavior/Cognition  Alert;Confused;Pleasant mood;Cooperative      Treatment Provided   Treatment provided  Cognitive-Linquistic      Cognitive-Linquistic Treatment   Treatment focused on  Patient/family/caregiver education;Cognition    Skilled Treatment  Pt wife reports pt has been doing iPad card games (strategy and non-strategy games), and pt reports he has been looking more at the calendar for his schedule. SLP explained that the strategy light games are better for attention and that strategy games are better for both attention and logic/reasoning. SLP suggested connect 4 and checkers for pt. In simple calendar tasks (written) pt req'd min cues usually for attention to detail and alternating attention as pt skipped stimuli without SLP mod cues. SLP made pt aware of errors and the need to double check for missed details or errors in safety when he thinks he's completed an activity. Wife tells SLP she is careful to watch pt at home.      Assessment / Recommendations / Plan   Plan  Continue with current plan of care      Progression Toward Goals   Progression toward goals  Progressing toward goals       SLP Education -  02/22/18 1705    Education provided  Yes    Education Details  error awareness on cog-ling tasks, need to double check work    [employer]) Educated  Patient;Spouse    Methods  Explanation    Comprehension  Verbalized understanding;Need further instruction       SLP Short Term Goals - 02/22/18 1707      SLP SHORT TERM GOAL #1   Title  pt will maintain loud /a/ average 90dB x 4 sessions    Time  3    Period  Weeks    Status  On-going      SLP SHORT TERM GOAL #2   Title  pt will demo 18/20 sentences with average low 70s dB x2 sessions    Time  3    Period  Weeks    Status  On-going      SLP SHORT TERM GOAL #3   Title  Pt will demonstrate WNL  rate of speech 18/20 sentneces with occasional min A over 2 sessions    Time  3    Period  Weeks    Status  On-going      SLP SHORT TERM GOAL #4   Title  Pt and spouse will utilize 3 external aids and compensations for attention and memory with success (per their report) over 3 sessions.     Time  3    Period  Weeks    Status  On-going       SLP Long Term Goals - 02/22/18 1707      SLP LONG TERM GOAL #1   Title  pt will demo loudness average low 70s dB  in 10 minutes simple conversation with occasional cues x3 sessions    Time  7    Period  Weeks    Status  On-going      SLP LONG TERM GOAL #2   Title  Pt will be 90% intelligible over 8 minute simple conversation in mildly noisy environment over 8 minutes with occasional min A over 2 sessions    Time  7    Period  Weeks    Status  On-going      SLP LONG TERM GOAL #3   Title  Pt and spouse will carryover 4 compensations for reduced attention and memory over 3 sessions per their report.     Time  7    Period  Weeks    Status  On-going       Plan - 02/22/18 1706    Clinical Impression Statement  Pt presents with dysarthria and reduced cognition. Pt req'd cues today for error awareness, see skilled intervention for more details. SLP education today re: compensations for memory and concentration/attention. Continue skilled ST to maximize cognitive and communicative function.    Speech Therapy Frequency  2x / week    Treatment/Interventions  Internal/external aids;SLP instruction and feedback;Language facilitation;Compensatory techniques;Environmental controls;Multimodal communcation approach;Cueing hierarchy;Functional tasks;Patient/family education    Potential to Achieve Goals  Fair    Potential Considerations  Severity of impairments;Ability to learn/carryover information    Consulted and Agree with Plan of Care  Patient;Family member/caregiver       Patient will benefit from skilled therapeutic intervention in order to  improve the following deficits and impairments:   Cognitive communication deficit  Dysarthria and anarthria    Problem List Patient Active Problem List   Diagnosis Date Noted  . Encounter for therapeutic drug monitoring 01/05/2016  . Paroxysmal atrial fibrillation (HCC) 12/24/2015  .  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 HLD (hyperlipidemia) 06/02/2015  . PD (Parkinson's disease) (HCC) 06/02/2015  . Mitral regurgitation 03/17/2015  . CVA (cerebral infarction)   . TIA (transient ischemic attack) 03/05/2015  . Slurred speech 03/05/2015  . Anxiety 12/19/2013  . Parkinson's disease (HCC) 08/06/2013  . Dyslipidemia 08/06/2013  . S/P mitral valve repair 08/06/2013    [facility] Center[facility]INKE,Dela Sweeny ,MS, CCC-SLP  02/22/2018, 5:08 PM  [place] [facility] [address] [place], [place], [address] Phone: [phone]   Fax:  [phone]   Name: [name] [name] MRN: [id number] Date of Birth: 06/27/1932

## 2018-02-22 NOTE — Therapy (Addendum)
[facility] [address] [place], [place], [address] Phone: [phone]   Fax:  [phone]  Occupational Therapy Treatment  Patient Details  Name: [name] [name] MRN: [id number] Date of Birth: 02-22-1932 Referring Provider: Dr. Rubin Payor   Encounter Date: 02/22/2018  OT End of Session - 02/22/18 1018    Visit Number  3    Number of Visits  11    OT Start Time  1020 2 units, pt in BR    OT Stop Time  1100    OT Time Calculation (min)  40 min       Past Medical History:  Diagnosis Date  . Dyslipidemia   . Parkinson's disease (HCC)   . S/P mitral valve replacement    mitral regurg, endocarditis  . Stroke [facility])     Past Surgical History:  Procedure Laterality Date  . CATARACT EXTRACTION  2009, 2012   right 2009, left 2012  . LOOP RECORDER IMPLANT Left 03/13/2015   Procedure: LOOP RECORDER IMPLANT;  Surgeon: Marinus Maw, MD;  Location: [facility] CATH LAB;  Service: Cardiovascular;  Laterality: Left;  . 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 TEE WITHOUT CARDIOVERSION N/A 03/13/2015   Procedure: TRANSESOPHAGEAL ECHOCARDIOGRAM (TEE);  Surgeon: Laurey Morale, MD;  Location[facility] ENDOSCOPY;  Service: Cardiovascular;  Laterality: N/A;  . TRANSTHORACIC ECHOCARDIOGRAM  08/15/2012   EF=>55%; normal LV systolic function; RV mildly dilated & systolic function mildly reduced; RA mod dilated; mild -mod MR & mildy increased gradients; mild TR; AV mildly sclerotic with mild-mod regurg    There were no vitals filed for this visit.  Subjective Assessment - 02/22/18 1018    Pertinent History  PD, hx of CVA mitral valve replacement, dyslipidemia, a-fib    Patient Stated Goals  maintain independence    Currently in Pain?  No/denies          Treatment: PWR! Hands followed by review of flipping/ dealing cards, and stacking/ manipulating coinc with  big movements, min-mod v.c Adapted strategy for fastening/ unfastening buttons, increased time required and mod v.c Ambulating while dual tasking to perform category generation, min-mod v.c for upright posture                      OT Long Term Goals - 02/02/18 1121      OT LONG TERM GOAL #1   Title  Pt will verbalize understanding of strategies/AE to assist with ADLs/IADLs prn.--03/11/18    Time  5    Period  Weeks    Status  New    Target Date  03/11/18      OT LONG TERM GOAL #2   Title  Pt / wife will verbalize understanding of compensatory strategies for short term memory deficits and ways to keep thinking skills sharp.    Time  5    Period  Weeks    Status  New      OT LONG TERM GOAL #3   Title  Pt will be I with updated PD specific HEP following review    Time  5    Period  Weeks    Status  New      OT LONG TERM GOAL #4   Title  Pt will demonstrate improved RUE fine motor coordination as evidenced by decreasing 9 hole peg test score by 3 secs  Baseline  RUE 39.56 secs, LUE 35.78 secs    Time  5    Period  Weeks    Status  New      OT LONG TERM GOAL #5   Title  Pt will demonstrate improved ease with dressing as evidenced by decreasing 3 button/ unbutton time to 58 secs or less.    Baseline  3 button/ unbutton 62 secs    Time  5    Period  Weeks    Status  New            Plan - 02/22/18 1327    Clinical Impression Statement  Pt is progressing towards goals. Pt responds well to verbal cues fro large amplitude movement and posture with functional activities.    Rehab Potential  Good    Current Impairments/barriers affecting progress:  cognitive deficits, decreased awareness of deficits    OT Frequency  2x / week    OT Duration  8 weeks    OT Treatment/Interventions  Self-care/ADL training;Therapeutic exercise;Visual/perceptual remediation/compensation;Patient/family education;Neuromuscular education;Moist Heat;Therapeutic activities;Cognitive  remediation/compensation;DME and/or AE instruction;Cryotherapy    Plan  short term memory strategies, adapted strategies for ADLs.    Consulted and Agree with Plan of Care  Patient;Family member/caregiver    Family Member Consulted  wife       Patient will benefit from skilled therapeutic intervention in order to improve the following deficits and impairments:  Abnormal gait, Decreased coordination, Decreased range of motion, Difficulty walking, Impaired flexibility, Decreased safety awareness, Decreased endurance, Decreased activity tolerance, Decreased knowledge of precautions, Impaired tone, Impaired UE functional use, Pain, Decreased balance, Decreased cognition, Decreased mobility, Decreased strength, Impaired vision/preception  Visit Diagnosis: Other symptoms and signs involving the nervous system  Visuospatial deficit  Attention and concentration deficit  Other lack of coordination    Problem List Patient Active Problem List   Diagnosis Date Noted  . Encounter for therapeutic drug monitoring 01/05/2016  . Paroxysmal atrial fibrillation (HCC) 12/24/2015  . 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 HLD (hyperlipidemia) 06/02/2015  . PD (Parkinson's disease) (HCC) 06/02/2015  . Mitral regurgitation 03/17/2015  . CVA (cerebral infarction)   . TIA (transient ischemic attack) 03/05/2015  . Slurred speech 03/05/2015  . Anxiety 12/19/2013  . Parkinson's disease (HCC) 08/06/2013  . Dyslipidemia 08/06/2013  . S/P mitral valve repair 08/06/2013    RINE,KATHRYN 02/22/2018, 8:43 PM  [facility] [address] [place], [place], [address] Phone: [phone]   Fax:  [phone]  Name: [name] [name] MRN: [id number] Date of Birth: 11/03/1932

## 2018-02-22 NOTE — Patient Instructions (Signed)
  Please complete the assigned speech therapy homework prior to your next session and return it to the speech therapist at your next visit.  

## 2018-02-22 NOTE — Patient Instructions (Signed)
Sit to Stand Transfers:  1. Scoot out to the edge of the chair 2. Place your feet flat on the floor, shoulder width apart.  Make sure your feet are tucked just under your knees. 3. Lean forward (nose over toes) with momentum, and stand up tall with your best posture.  If you need to use your arms, use them as a quick boost up to stand. 4. If you are in a low or soft chair, you can lean back and then forward up to stand, in order to get more momentum. 5. Once you are standing, make sure you are looking ahead and standing tall.  ROCK side to side to start your walking with a BIG step.   To sit down:  1. Back up until you feel the chair behind your legs. 2. Bend at your hips, reaching  Back for you chair, if needed, then slowly squat to sit down on your chair.   Tips to reduce freezing episodes with standing or walking:  1.  Stand tall with your feet wide, so that you can rock and weight shift through your hips. 2.  Don't try to fight the freeze: if you begin taking slower, faster, smaller steps, STOP, get your posture tall, and RESET your posture and balance.  Take a deep breath before taking the BIG step to start again. 3.  March in place, with high knee stepping, to get started walking again. 4.  Use auditory cues:  Count out loud, think of a familiar tune or song or cadence, use pocket metronome, to use rhythm to get started walking again. 5.  Use visual cues:  Use a line to step over, use laser pointer line to step over, (using BIG steps) to start walking again. 6. Use visual targets to keep your posture tall (look ahead and focus on an object or target at eye level). 7. As you approach where your destination with walking, count your steps out loud and/or focus on your target with your eyes until you are fully there. 8. Use appropriate assistive device, as advised by your physical therapist to assist with taking longer, consistent steps.

## 2018-02-28 ENCOUNTER — Encounter: Payer: Medicare Other | Admitting: Physician Assistant

## 2018-02-28 ENCOUNTER — Encounter: Payer: Medicare Other | Admitting: Occupational Therapy

## 2018-02-28 ENCOUNTER — Ambulatory Visit: Payer: Medicare Other | Admitting: *Deleted

## 2018-02-28 DIAGNOSIS — G458 Other transient cerebral ischemic attacks and related syndromes: Secondary | ICD-10-CM

## 2018-02-28 DIAGNOSIS — Z5181 Encounter for therapeutic drug level monitoring: Secondary | ICD-10-CM

## 2018-02-28 DIAGNOSIS — I480 Paroxysmal atrial fibrillation: Secondary | ICD-10-CM

## 2018-02-28 DIAGNOSIS — I63412 Cerebral infarction due to embolism of left middle cerebral artery: Secondary | ICD-10-CM

## 2018-02-28 DIAGNOSIS — Z9889 Other specified postprocedural states: Secondary | ICD-10-CM | POA: Diagnosis not present

## 2018-02-28 DIAGNOSIS — G459 Transient cerebral ischemic attack, unspecified: Secondary | ICD-10-CM

## 2018-02-28 LAB — POCT INR: INR: 2.1

## 2018-02-28 NOTE — Patient Instructions (Signed)
Description   Continue same dosage 1/2 tablet daily except 1 tablet on Tuesdays.  Recheck INR in 5 weeks. [facility] [phone]

## 2018-03-01 ENCOUNTER — Encounter: Payer: Medicare Other | Admitting: Occupational Therapy

## 2018-03-01 ENCOUNTER — Encounter: Payer: Self-pay | Admitting: Physical Therapy

## 2018-03-01 ENCOUNTER — Ambulatory Visit: Payer: Medicare Other | Admitting: Occupational Therapy

## 2018-03-01 ENCOUNTER — Ambulatory Visit: Payer: Medicare Other

## 2018-03-01 ENCOUNTER — Ambulatory Visit: Payer: Medicare Other | Admitting: Physical Therapy

## 2018-03-01 DIAGNOSIS — R2689 Other abnormalities of gait and mobility: Secondary | ICD-10-CM | POA: Diagnosis not present

## 2018-03-01 DIAGNOSIS — R29818 Other symptoms and signs involving the nervous system: Secondary | ICD-10-CM

## 2018-03-01 DIAGNOSIS — R471 Dysarthria and anarthria: Secondary | ICD-10-CM

## 2018-03-01 DIAGNOSIS — R2681 Unsteadiness on feet: Secondary | ICD-10-CM

## 2018-03-01 DIAGNOSIS — R41840 Attention and concentration deficit: Secondary | ICD-10-CM

## 2018-03-01 DIAGNOSIS — R41842 Visuospatial deficit: Secondary | ICD-10-CM

## 2018-03-01 DIAGNOSIS — R41841 Cognitive communication deficit: Secondary | ICD-10-CM

## 2018-03-01 DIAGNOSIS — R278 Other lack of coordination: Secondary | ICD-10-CM

## 2018-03-01 NOTE — Patient Instructions (Signed)
Memory Compensation Strategies  1. Use "WARM" strategy.  W= write it down  A= associate it  R= repeat it  M= make a mental note  2.   You can keep a Memory Notebook.  Use a 3-ring notebook with sections for the following: calendar, important names and phone numbers,  medications, doctors' names/phone numbers, lists/reminders, and a section to journal what you did  each day.   3.    Use a calendar to write appointments down.  4.    Write yourself a schedule for the day.  This can be placed on the calendar or in a separate section of the Memory Notebook.  Keeping a  regular schedule can help memory.  5.    Use medication organizer with sections for each day or morning/evening pills.  You may need help loading it  6.    Keep a basket, or pegboard by the door.  Place items that you need to take out with you in the basket or on the pegboard.  You may also want to  include a message board for reminders.  7.    Use sticky notes.  Place sticky notes with reminders in a place where the task is performed.  For example: " turn off the  stove" placed by the stove, "lock the door" placed on the door at eye level, " take your medications" on  the bathroom mirror or by the place where you normally take your medications.  8.    Use alarms/timers.  Use while cooking to remind yourself to check on food or as a reminder to take your medicine, or as a  reminder to make a call, or as a reminder to perform another task, etc.     Keeping Thinking Skills Sharp: 1. Jigsaw puzzles 2. Card/board games 3. Talking on the phone/social events 4. Lumosity.com 5. Online games 6. Word serches/crossword puzzles 7.  Logic puzzles 8. Aerobic exercise (stationary bike) 9. Eating balanced diet (fruits & veggies) 10. Drink water 11. Try something new--new recipe, hobby 12. Crafts 13. Do a variety of activities that are challenging  

## 2018-03-01 NOTE — Therapy (Signed)
[facility] [address] [place], [place], [address] Phone: [phone]   Fax:  [phone]  Occupational Therapy Treatment  Patient Details  Name: [name] [name] MRN: [id number] Date of Birth: 04/24/1932 Referring Provider: Dr. Rubin PayorSiddiqui   Encounter Date: 03/01/2018  OT End of Session - 03/01/18 1618    Visit Number  4    Number of Visits  11    Date for OT Re-Evaluation  03/11/18    Authorization Type  [employer] MCR    OT Start Time  0935    OT Stop Time  1015    OT Time Calculation (min)  40 min    Behavior During Therapy  [facility] for tasks assessed/performed       Past Medical History:  Diagnosis Date  . Dyslipidemia   . Parkinson's disease (HCC)   . S/P mitral valve replacement    mitral regurg, endocarditis  . Stroke [facility](HCC)     Past Surgical History:  Procedure Laterality Date  . CATARACT EXTRACTION  2009, 2012   right 2009, left 2012  . LOOP RECORDER IMPLANT Left 03/13/2015   Procedure: LOOP RECORDER IMPLANT;  Surgeon: Marinus MawGregg W Taylor, MD;  [facility];  Service: Cardiovascular;  Laterality: Left;  . 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 TEE WITHOUT CARDIOVERSION N/A 03/13/2015   Procedure: TRANSESOPHAGEAL ECHOCARDIOGRAM (TEE);  Surgeon: Laurey Moralealton S McLean, MD;  [facility];  Service: Cardiovascular;  Laterality: N/A;  . TRANSTHORACIC ECHOCARDIOGRAM  08/15/2012   EF=>55%; normal LV systolic function; RV mildly dilated & systolic function mildly reduced; RA mod dilated; mild -mod MR & mildy increased gradients; mild TR; AV mildly sclerotic with mild-mod regurg    There were no vitals filed for this visit.  Subjective Assessment - 03/01/18 0937    Subjective   denies pain    Patient Stated Goals  maintain independence    Currently in Pain?  No/denies           Treatment: education regarding keeping  thinking skills sharp and memory compensations, pt / wife verbalize understanding Strategy for donning belt with big movements, min v.c Dynamic step and reach with right and left UE's min-mod v.c for larger amplitude movements, speed and upright posture.                     OT Long Term Goals - 03/01/18 1621      OT LONG TERM GOAL #1   Title  Pt will verbalize understanding of strategies/AE to assist with ADLs/IADLs prn.--03/11/18    Status  On-going      OT LONG TERM GOAL #2   Title  Pt / wife will verbalize understanding of compensatory strategies for short term memory deficits and ways to keep thinking skills sharp.    Status  Achieved      OT LONG TERM GOAL #3   Title  Pt will be I with updated PD specific HEP following review    Status  On-going      OT LONG TERM GOAL #4   Title  Pt will demonstrate improved RUE fine motor coordination as evidenced by decreasing 9 hole peg test score by 3 secs    Status  On-going      OT LONG TERM GOAL #5   Title  Pt will demonstrate improved ease with dressing as evidenced by decreasing  3 button/ unbutton time to 58 secs or less.    Status  On-going            Plan - 03/01/18 1622    Clinical Impression Statement  Pt is progressing towards goals. Pt can benefit from continued reinforcement of large amplitude movements with functional activity.    Rehab Potential  Good    Current Impairments/barriers affecting progress:  cognitive deficits, decreased awareness of deficits    OT Frequency  2x / week    OT Duration  8 weeks    OT Treatment/Interventions  Self-care/ADL training;Therapeutic exercise;Visual/perceptual remediation/compensation;Patient/family education;Neuromuscular education;Moist Heat;Therapeutic activities;Cognitive remediation/compensation;DME and/or AE instruction;Cryotherapy    Plan  reinforce ADL strategies, big movements with functional activity    Consulted and Agree with Plan of Care  Patient;Family  member/caregiver    Family Member Consulted  wife       Patient will benefit from skilled therapeutic intervention in order to improve the following deficits and impairments:  Abnormal gait, Decreased coordination, Decreased range of motion, Difficulty walking, Impaired flexibility, Decreased safety awareness, Decreased endurance, Decreased activity tolerance, Decreased knowledge of precautions, Impaired tone, Impaired UE functional use, Pain, Decreased balance, Decreased cognition, Decreased mobility, Decreased strength, Impaired vision/preception  Visit Diagnosis: Other symptoms and signs involving the nervous system  Visuospatial deficit  Attention and concentration deficit  Other lack of coordination    Problem List Patient Active Problem List   Diagnosis Date Noted  . Encounter for therapeutic drug monitoring 01/05/2016  . Paroxysmal atrial fibrillation (HCC) 12/24/2015  . 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 HLD (hyperlipidemia) 06/02/2015  . PD (Parkinson's disease) (HCC) 06/02/2015  . Mitral regurgitation 03/17/2015  . CVA (cerebral infarction)   . TIA (transient ischemic attack) 03/05/2015  . Slurred speech 03/05/2015  . Anxiety 12/19/2013  . Parkinson's disease (HCC) 08/06/2013  . Dyslipidemia 08/06/2013  . S/P mitral valve repair 08/06/2013    [name] [name] 03/01/2018, 4:23 PM Keene Breath, OTR/L [phone] Phone: [phone] 4:34 PM 03/01/18 [facility] [address] [place], [place], [address] Phone: [phone]   Fax:  [phone]  Name: [name] [name] MRN: [id number] Date of Birth: 1932-01-08

## 2018-03-01 NOTE — Therapy (Signed)
[facility] [address] [place], [place], [address] Phone: [phone]   Fax:  [phone]  Speech Language Pathology Treatment  Patient Details  Name: [name] [name] MRN: [id number] Date of Birth: 20-Jul-1932 Referring Provider: Dr. Jaquita Folds   Encounter Date: 03/01/2018  End of Session - 03/01/18 1657    Visit Number  5    Number of Visits  17    Date for SLP Re-Evaluation  04/07/18    SLP Start Time  0807 pt 6 minutes late    SLP Stop Time   0846    SLP Time Calculation (min)  39 min    Activity Tolerance  Patient tolerated treatment well       Past Medical History:  Diagnosis Date  . Dyslipidemia   . Parkinson's disease (HCC)   . S/P mitral valve replacement    mitral regurg, endocarditis  . Stroke [facility])     Past Surgical History:  Procedure Laterality Date  . CATARACT EXTRACTION  2009, 2012   right 2009, left 2012  . LOOP RECORDER IMPLANT Left 03/13/2015   Procedure: LOOP RECORDER IMPLANT;  Surgeon: Marinus Maw, MD;  Location: [facility] CATH LAB;  Service: Cardiovascular;  Laterality: Left;  . 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 TEE WITHOUT CARDIOVERSION N/A 03/13/2015   Procedure: TRANSESOPHAGEAL ECHOCARDIOGRAM (TEE);  Surgeon: Laurey Morale, MD;  Location: [facility] ENDOSCOPY;  Service: Cardiovascular;  Laterality: N/A;  . TRANSTHORACIC ECHOCARDIOGRAM  08/15/2012   EF=>55%; normal LV systolic function; RV mildly dilated & systolic function mildly reduced; RA mod dilated; mild -mod MR & mildy increased gradients; mild TR; AV mildly sclerotic with mild-mod regurg    There were no vitals filed for this visit.  Subjective Assessment - 03/01/18 0809    Subjective  "What did you think of the [employer]?" (pt) "Yes, Baldo Ash (he is producing WNL loudness), but this is morning." (wife)    Patient is accompained by:  Family member  wife-stephanie    Currently in Pain?  No/denies            ADULT SLP TREATMENT - 03/01/18 0810      General Information   Behavior/Cognition  Alert;Confused;Pleasant mood;Cooperative      Treatment Provided   Treatment provided  Cognitive-Linquistic      Cognitive-Linquistic Treatment   Treatment focused on  Patient/family/caregiver education;Cognition    Skilled Treatment  self care/home management (14 minutes): Pt wife brought her iPad for SLP to download apps helpful for a sound level meter app. SLP navigated to 3-4 dB apps and educated pt/wife the facets to consider for download.  SLP also educated on compensaions for attention.  (Speech tx- ind 30 minutes): pt's loud /a/ averaged upper 80s dB. In short conversation tasks of 3-4 minutes pt maintained lower 70s average. SLP educated pt and wife re: muscle memory and speech loudness/rationale why loud /a/ needs to be daily.      Assessment / Recommendations / Plan   Plan  Continue with current plan of care      Progression Toward Goals   Progression toward goals  Progressing toward goals       SLP Education - 03/01/18 1657    Education provided  Yes    Education Details  rationale why loud "ah" has to be daily    Person(s) Educated  Patient;Spouse    Methods  Explanation    Comprehension  Verbalized understanding;Need further instruction pt needs further instruction   pt needs further instruction      SLP Short Term Goals - 03/01/18 1701      SLP SHORT TERM GOAL #1   Title  pt will maintain loud /a/ average 90dB x 4 sessions    Time  2    Period  Weeks    Status  On-going      SLP SHORT TERM GOAL #2   Title  pt will demo 18/20 sentences with average low 70s dB x2 sessions    Time  2    Period  Weeks    Status  On-going      SLP SHORT TERM GOAL #3   Title  Pt will demonstrate WNL rate of speech 18/20 sentneces with occasional min A over 2 sessions    Time  2    Period  Weeks    Status  On-going      SLP  SHORT TERM GOAL #4   Title  Pt and spouse will utilize 3 external aids and compensations for attention and memory with success (per their report) over 3 sessions.     Time  2    Period  Weeks    Status  On-going       SLP Long Term Goals - 03/01/18 1702      SLP LONG TERM GOAL #1   Title  pt will demo loudness average low 70s dB  in 10 minutes simple conversation with occasional cues x3 sessions    Time  6    Period  Weeks    Status  On-going      SLP LONG TERM GOAL #2   Title  Pt will be 90% intelligible over 8 minute simple conversation in mildly noisy environment over 8 minutes with occasional min A over 2 sessions    Time  6    Period  Weeks    Status  On-going      SLP LONG TERM GOAL #3   Title  Pt and spouse will carryover 4 compensations for reduced attention and memory over 3 sessions per their report.     Time  6    Period  Weeks    Status  On-going       Plan - 03/01/18 1657    Clinical Impression Statement  Pt presents with dysarthria and reduced cognition. Pt was independent today wiht short conversation, but wife cautioned SLP that pt's speech is always better in AMs. See skilled intervention for more details. SLP education today re: compensations for memory and concentration/attention. Continue skilled ST to maximize cognitive and communicative function.    Speech Therapy Frequency  2x / week    Duration  1 week 8 weeks/17 visits    Treatment/Interventions  Internal/external aids;SLP instruction and feedback;Language facilitation;Compensatory techniques;Environmental controls;Multimodal communcation approach;Cueing hierarchy;Functional tasks;Patient/family education    Potential to Achieve Goals  Fair    Potential Considerations  Severity of impairments;Ability to learn/carryover information       Patient will benefit from skilled therapeutic intervention in order to improve the following deficits and impairments:   Dysarthria and anarthria  Cognitive  communication deficit    Problem List Patient Active Problem List   Diagnosis Date Noted  . Encounter for therapeutic drug monitoring 01/05/2016  . Paroxysmal atrial fibrillation (HCC) 12/24/2015  . 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 HLD (hyperlipidemia) 06/02/2015  .  PD (Parkinson's disease) (HCC) 06/02/2015  . Mitral regurgitation 03/17/2015  . CVA (cerebral infarction)   . TIA (transient ischemic attack) 03/05/2015  . Slurred speech 03/05/2015  . Anxiety 12/19/2013  . Parkinson's disease (HCC) 08/06/2013  . Dyslipidemia 08/06/2013  . S/P mitral valve repair 08/06/2013    [facility],[name] [name] ,MS, CCC-SLP  03/01/2018, 5:09 PM  [facility] [address] [place], [place], [address] Phone: [phone]   Fax:  [phone]   Name: [name] MRN: [id number] Date of Birth: 08/27/1932

## 2018-03-02 NOTE — Therapy (Signed)
[facility] [address] [place], [place], [address] Phone: [phone]   Fax:  [phone]  Physical Therapy Treatment  Patient Details  Name: [name] [name] MRN: [id number] Date of Birth: 02-04-1932 Referring Provider: Dr. Rubin Payor   Encounter Date: 03/01/2018  PT End of Session - 03/02/18 0934    Visit Number  3    Number of Visits  10    Date for PT Re-Evaluation  04/21/18    Authorization Type  [employer] Medicare    PT Start Time  0849    PT Stop Time  0929    PT Time Calculation (min)  40 min    Activity Tolerance  Patient tolerated treatment well    Behavior During Therapy  [facility] for tasks assessed/performed       Past Medical History:  Diagnosis Date  . Dyslipidemia   . Parkinson's disease (HCC)   . S/P mitral valve replacement    mitral regurg, endocarditis  . Stroke [facility])     Past Surgical History:  Procedure Laterality Date  . CATARACT EXTRACTION  2009, 2012   right 2009, left 2012  . LOOP RECORDER IMPLANT Left 03/13/2015   Procedure: LOOP RECORDER IMPLANT;  Surgeon: Marinus Maw, MD;  Location: [facility] CATH LAB;  Service: Cardiovascular;  Laterality: Left;  . 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 TEE WITHOUT CARDIOVERSION N/A 03/13/2015   Procedure: TRANSESOPHAGEAL ECHOCARDIOGRAM (TEE);  Surgeon: Laurey Morale, MD;  Location: [facility] ENDOSCOPY;  Service: Cardiovascular;  Laterality: N/A;  . TRANSTHORACIC ECHOCARDIOGRAM  08/15/2012   EF=>55%; normal LV systolic function; RV mildly dilated & systolic function mildly reduced; RA mod dilated; mild -mod MR & mildy increased gradients; mild TR; AV mildly sclerotic with mild-mod regurg    There were no vitals filed for this visit.  Subjective Assessment - 03/01/18 0854    Subjective  Feel like the standing with wide BOS and weightshifting is helping.    Patient is accompained by:   Family member wife    Patient Stated Goals  Pt's goals for therapy include how to start up walking.    Currently in Pain?  No/denies                       [facility] Adult PT Treatment/Exercise - 03/01/18 0901      Transfers   Transfers  Sit to Stand;Stand to Sit    Sit to Stand  6: Modified independent (Device/Increase time);Without upper extremity assist;From bed    Stand to Sit  6: Modified independent (Device/Increase time);Without upper extremity assist;To bed    Number of Reps  10 reps;Other sets (comment) from 20", 18" surfaces    Transfer Cueing  Cues provided for upright posture upon standing, reminders to rock and then start with BIG step for gait      At end of session, wife asked question about the best way to help pt up from low soft surfaces.  Practiced from simulated lower, softer surface, with cues for scooting (either forward or lateral scooting) to edge of chair, then use of rocking forward<>back to gain momentum for sit>stand.  Pt able to return demo 5 reps with cues only, no physical assistance.  PWR [facility] - 03/01/18 9528    PWR! exercises  Moves in standing    PWR! Up  x 20    PWR! [employer]  x 20     PWR! Twist  x 20    PWR Step  x 20 forward, side, back    Comments  Cues for pacing, deliberate and large technique; explained rationale and functional connection for each PWR! Move above (PRW! Up for posture, PWR! Rock for Verizonweightshifting and turns, PWR! Twist for trunk rotation, PWR! Step for initial step/balance recovery)       Balance Exercises - 03/02/18 0931      Balance Exercises: Standing   Wall Bumps  Hip    Wall Bumps-Hips  Eyes opened;Anterior/posterior;10 reps 2 sets    Heel Raises Limitations  2 sets x 10 reps    Toe Raise Limitations  2 sets x 10 reps       Pt asks about the amount of exercises he has, with hesitation because he "could be exercising all day long".  PT requests pt bring in current ex pictures/folder in order to organize  exercise chart to manage weekly exercise program for home.       PT Long Term Goals - 02/20/18 0950      PT LONG TERM GOAL #1   Title  Pt/wife will verbalize understanding of fall prevention within home environment. TARGET 03/24/18    Time  5    Period  Weeks    Status  New    Target Date  03/24/18      PT LONG TERM GOAL #2   Title  Pt will improve MiniBESTest to at least 20/28 for improved balance/decreased fall risk.    Time  5    Period  Weeks    Status  New    Target Date  03/24/18      PT LONG TERM GOAL #3   Title  Pt/wife will verbalize understanding of tips to reduce freezing episodes with gait and turns.    Time  5    Period  Weeks    Status  New    Target Date  03/24/18      PT LONG TERM GOAL #4   Title  Pt will perform HEP with wife's supervision, for improved balance, functional mobility and posture.    Time  5    Period  Weeks    Status  New    Target Date  03/24/18            Plan - 03/02/18 0934    Clinical Impression Statement  Pt and wife appear pleased with improvements noted from previous session regarding transfers and initiation of gait.  Pt does verbalize some hesitation about amount of exercises he needs to do on a daily basis.  Will plan to formally review his HEP and help organize weekly plan of exercises next visit.  With cueing to slow pace of exercise and movement patterns, pt appears more stable and with improved balance.  Pt will continue to benefit from skilled PT to address balance, posture and gait.    Rehab Potential  Good    PT Frequency  2x / week    PT Duration  Other (comment) 5 weeks    PT Treatment/Interventions  ADLs/Self Care Home Management;DME Instruction;Gait training;Stair training;Functional mobility training;Therapeutic activities;Therapeutic exercise;Balance training;Patient/family education;Neuromuscular re-education    PT Next Visit Plan  Review and organize pt's full HEP; discuss additions of balance exercise/PWR!  Moves in standing; review sit<>stand and tips to reduce freezing episodes with gait    Consulted and Agree with Plan of Care  Patient;Family member/caregiver    Family Member  Consulted  wife       Patient will benefit from skilled therapeutic intervention in order to improve the following deficits and impairments:  Abnormal gait, Decreased balance, Decreased mobility, Decreased safety awareness, Decreased strength, Difficulty walking, Postural dysfunction  Visit Diagnosis: Unsteadiness on feet  Other symptoms and signs involving the nervous system     Problem List Patient Active Problem List   Diagnosis Date Noted  . Encounter for therapeutic drug monitoring 01/05/2016  . Paroxysmal atrial fibrillation (HCC) 12/24/2015  . 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 HLD (hyperlipidemia) 06/02/2015  . PD (Parkinson's disease) (HCC) 06/02/2015  . Mitral regurgitation 03/17/2015  . CVA (cerebral infarction)   . TIA (transient ischemic attack) 03/05/2015  . Slurred speech 03/05/2015  . Anxiety 12/19/2013  . Parkinson's disease (HCC) 08/06/2013  . Dyslipidemia 08/06/2013  . S/P mitral valve repair 08/06/2013    Nirali Magouirk W. 03/02/2018, 9:40 AM  Gean Maidens., PT   [facility] [address] [place], [place], [address] Phone: [phone]   Fax:  [phone]  Name: [name] [name] MRN: [id number] Date of Birth: 12/09/1931

## 2018-03-03 ENCOUNTER — Ambulatory Visit (INDEPENDENT_AMBULATORY_CARE_PROVIDER_SITE_OTHER): Payer: Medicare Other | Admitting: *Deleted

## 2018-03-03 DIAGNOSIS — G459 Transient cerebral ischemic attack, unspecified: Secondary | ICD-10-CM | POA: Diagnosis not present

## 2018-03-06 NOTE — Progress Notes (Signed)
Carelink Summary Report / Loop Recorder 

## 2018-03-07 ENCOUNTER — Encounter: Payer: Self-pay | Admitting: Speech Pathology

## 2018-03-07 ENCOUNTER — Encounter: Payer: Self-pay | Admitting: Physical Therapy

## 2018-03-07 ENCOUNTER — Ambulatory Visit: Payer: Medicare Other | Admitting: Physical Therapy

## 2018-03-07 ENCOUNTER — Ambulatory Visit: Payer: Medicare Other | Admitting: Speech Pathology

## 2018-03-07 ENCOUNTER — Ambulatory Visit: Payer: Medicare Other | Admitting: Occupational Therapy

## 2018-03-07 DIAGNOSIS — R2689 Other abnormalities of gait and mobility: Secondary | ICD-10-CM

## 2018-03-07 DIAGNOSIS — R471 Dysarthria and anarthria: Secondary | ICD-10-CM

## 2018-03-07 DIAGNOSIS — R278 Other lack of coordination: Secondary | ICD-10-CM

## 2018-03-07 DIAGNOSIS — R29898 Other symptoms and signs involving the musculoskeletal system: Secondary | ICD-10-CM

## 2018-03-07 DIAGNOSIS — R29818 Other symptoms and signs involving the nervous system: Secondary | ICD-10-CM

## 2018-03-07 DIAGNOSIS — R2681 Unsteadiness on feet: Secondary | ICD-10-CM

## 2018-03-07 DIAGNOSIS — R41840 Attention and concentration deficit: Secondary | ICD-10-CM

## 2018-03-07 DIAGNOSIS — R41842 Visuospatial deficit: Secondary | ICD-10-CM

## 2018-03-07 DIAGNOSIS — R41841 Cognitive communication deficit: Secondary | ICD-10-CM

## 2018-03-07 DIAGNOSIS — R293 Abnormal posture: Secondary | ICD-10-CM

## 2018-03-07 NOTE — Therapy (Signed)
[facility] [address] [place], [place], [address] Phone: [phone]   Fax:  [phone]  Speech Language Pathology Treatment  Patient Details  Name: [name] [name] MRN: [id number] Date of Birth: 1932-01-17 Referring Provider: Dr. Eldridge Abrahams   Encounter Date: 03/07/2018  End of Session - 03/07/18 1221    Visit Number  6    Number of Visits  17    Date for SLP Re-Evaluation  04/07/18    SLP Start Time  1017    SLP Stop Time   1102    SLP Time Calculation (min)  45 min    Activity Tolerance  Patient tolerated treatment well       Past Medical History:  Diagnosis Date  . Dyslipidemia   . Parkinson's disease ([place])   . S/P mitral valve replacement    mitral regurg, endocarditis  . Stroke [facility])     Past Surgical History:  Procedure Laterality Date  . CATARACT EXTRACTION  2009, 2012   right 2009, left 2012  . LOOP RECORDER IMPLANT Left 03/13/2015   Procedure: LOOP RECORDER IMPLANT;  Surgeon: Evans Lance, MD;  Location: [facility] CATH LAB;  Service: Cardiovascular;  Laterality: Left;  . MITRAL VALVE ANNULOPLASTY  04/10/2007    13m Edwards ring; r/t h/o MR and flail mitral leaflets due to endocarditis: MRI safe  . TEE WITHOUT CARDIOVERSION N/A 03/13/2015   Procedure: TRANSESOPHAGEAL ECHOCARDIOGRAM (TEE);  Surgeon: DLarey Dresser MD;  Location: [facility]  Service: Cardiovascular;  Laterality: N/A;  . TRANSTHORACIC ECHOCARDIOGRAM  08/15/2012   EF=>55%; normal LV systolic function; RV mildly dilated & systolic function mildly reduced; RA mod dilated; mild -mod MR & mildy increased gradients; mild TR; AV mildly sclerotic with mild-mod regurg    There were no vitals filed for this visit.  Subjective Assessment - 03/07/18 1024    Subjective  "It's time the grandkids come back from college and it's vacation season"    Patient is accompained by:  Family member    Special Tests  spouse    Currently in Pain?  No/denies             ADULT SLP TREATMENT - 03/07/18 1026      General Information   Behavior/Cognition  Alert;Confused;Pleasant mood;Cooperative      Treatment Provided   Treatment provided  Cognitive-Linquistic      Cognitive-Linquistic Treatment   Treatment focused on  Patient/family/caregiver education;Cognition    Skilled Treatment  Loud /a/ average 92dB with mod I. Volume facilitated in simple naming tasks with average of 72dB. 20 minute midly complex conversation pt averaged 70dB with rare min visual cue. Educated pt and spouse re: spouse and environmental compensations to assist Purnell attend to and comprehend instructions, questions and cue him back to topic.      Assessment / Recommendations / Plan   Plan  Continue with current plan of care      Progression Toward Goals   Progression toward goals  Progressing toward goals pt and spouse request d/c at this time       SLP Education - 03/07/18 1216    Education provided  Yes    Education Details  compensations for attention, slow processing    Person(s) Educated  Patient;Spouse    Methods  Explanation    Comprehension  Verbalized understanding;Need further instruction       SPEECH THERAPY DISCHARGE SUMMARY  Visits from Start of Care: 6  Current functional level related to goals /  functional outcomes: See goals below   Remaining deficits: Cognition; dysarthria   Education / Equipment: Compensations for dysarthria, attention and memory, HEP for dysarthria  Plan: Patient agrees to discharge.  Patient goals were partially met. Patient is being discharged due to being pleased with the current functional level.  ?????          SLP Short Term Goals - 03/07/18 1219      SLP SHORT TERM GOAL #1   Title  pt will maintain loud /a/ average 90dB x 4 sessions    Time  2    Period  Weeks    Status  Achieved      SLP SHORT TERM GOAL #2   Title  pt will demo 18/20 sentences with average low 70s dB x2 sessions    Time  2     Period  Weeks    Status  Achieved      SLP SHORT TERM GOAL #3   Title  Pt will demonstrate WNL rate of speech 18/20 sentneces with occasional min A over 2 sessions    Baseline  03/07/18    Time  2    Period  Weeks    Status  Partially Met      SLP SHORT TERM GOAL #4   Title  Pt and spouse will utilize 3 external aids and compensations for attention and memory with success (per their report) over 3 sessions.     Time  2    Period  Weeks    Status  Not Met       SLP Long Term Goals - 03/07/18 1220      SLP LONG TERM GOAL #1   Title  pt will demo loudness average low 70s dB  in 10 minutes simple conversation with occasional cues x3 sessions    Baseline  03/07/18 - one session, due to pt/spouse request d/c    Time  6    Period  Weeks    Status  Partially Met      SLP LONG TERM GOAL #2   Title  Pt will be 90% intelligible over 8 minute simple conversation in mildly noisy environment over 8 minutes with occasional min A over 2 sessions    Time  6    Period  Weeks    Status  Not Met      SLP LONG TERM GOAL #3   Title  Pt and spouse will carryover 4 compensations for reduced attention and memory over 3 sessions per their report.     Time  6    Period  Weeks    Status  Not Met       Plan - 03/07/18 1217    Clinical Impression Statement  Pt maintained volume of 70dB average in short conversation with mod I. In longer conversation, pt required rare min visual cues. Spouse reports pt's intellgilbity and volume have significantly improved  since he began this course of ST .Pt and spouse are pleased with current level and function and are requesting d/c from ST due to family plans and travel. Pt to continue daily /a/ and volume practice.    Speech Therapy Frequency  2x / week    Duration  1 week    Treatment/Interventions  Internal/external aids;SLP instruction and feedback;Language facilitation;Compensatory techniques;Environmental controls;Multimodal communcation approach;Cueing  hierarchy;Functional tasks;Patient/family education    Potential to Achieve Goals  Fair    Potential Considerations  Severity of impairments;Ability to learn/carryover information    Consulted  and Agree with Plan of Care  Patient;Family member/caregiver       Patient will benefit from skilled therapeutic intervention in order to improve the following deficits and impairments:   Cognitive communication deficit  Dysarthria and anarthria    Problem List Patient Active Problem List   Diagnosis Date Noted  . Encounter for therapeutic drug monitoring 01/05/2016  . Paroxysmal atrial fibrillation ([place]) 12/24/2015  . Cerebral infarction due to embolism of left middle cerebral artery ([place]) 06/02/2015  . HLD (hyperlipidemia) 06/02/2015  . PD (Parkinson's disease) ([place]) 06/02/2015  . Mitral regurgitation 03/17/2015  . CVA (cerebral infarction)   . TIA (transient ischemic attack) 03/05/2015  . Slurred speech 03/05/2015  . Anxiety 12/19/2013  . Parkinson's disease ([place]) 08/06/2013  . Dyslipidemia 08/06/2013  . S/P mitral valve repair 08/06/2013    Shaquera Ansley, Annye Rusk MS, CCC-SLP 03/07/2018, 12:22 PM  [facility] [address] [place], [place], [address] Phone: [phone]   Fax:  [phone]   Name: Kendon Sedeno MRN: [id number] Date of Birth: 1931/12/03

## 2018-03-07 NOTE — Patient Instructions (Signed)
  Processing  Speed in conversation is slower - if information is important, make sure you have his attention first  Loud background noise like parties, and restaurants are harder to processes speech  Short, direct sentences, questions  Group conversation will be difficult because it is so fast to processes, then form a response that quickly. People expect a response in 2 seconds.    Tips to help facilitate better attention, concentration, focus   Do harder, longer tasks when you are most alert/awake  Break down larger tasks into small parts  Limit distractions of TV, radio, conversation, e mails/texts, appliance noise, etc - if a job is important, do it in a quiet room  Be aware of how you are functioning in high stimulation environments such as large stores, parties, restaurants - any place with lots of lights, noise, signs etc  Group conversations may be more difficult to process than one on one conversations  Give yourself extra time to process conversation, reading materials, directions or information from your healthcare providers  Organization is key - clutters of laundry, mail, paperwork, dirty dishes - all make it more difficult to concentrate  Before you start a task, have all the needed supplies, directions, recipes ready and organized. This way you don't have to go looking for something in the middle of a task and become distracted.   Be aware of fatigue - take rests or breaks when needed to re-group and re-focus

## 2018-03-08 ENCOUNTER — Ambulatory Visit: Payer: Medicare Other | Admitting: Physical Therapy

## 2018-03-08 ENCOUNTER — Ambulatory Visit: Payer: Medicare Other | Admitting: Occupational Therapy

## 2018-03-08 NOTE — Therapy (Signed)
[facility] [address] [place], [place], [address] Phone: [phone]   Fax:  [phone]  Physical Therapy Treatment  Patient Details  Name: [name] [name] MRN: [id number] Date of Birth: 08-09-1932 Referring Provider: Dr. Linus Mako   Encounter Date: 03/07/2018  PT End of Session - 03/08/18 1118    Visit Number  4    Number of Visits  10    Date for PT Re-Evaluation  04/21/18    Authorization Type  UHC Medicare    PT Start Time  0934    PT Stop Time  1012    PT Time Calculation (min)  38 min    Activity Tolerance  Patient tolerated treatment well    Behavior During Therapy  [facility] for tasks assessed/performed       Past Medical History:  Diagnosis Date  . Dyslipidemia   . Parkinson's disease ([place])   . S/P mitral valve replacement    mitral regurg, endocarditis  . Stroke [facility])     Past Surgical History:  Procedure Laterality Date  . CATARACT EXTRACTION  2009, 2012   right 2009, left 2012  . LOOP RECORDER IMPLANT Left 03/13/2015   Procedure: LOOP RECORDER IMPLANT;  Surgeon: Evans Lance, MD;  Location: [facility] CATH LAB;  Service: Cardiovascular;  Laterality: Left;  . MITRAL VALVE ANNULOPLASTY  04/10/2007    9m Edwards ring; r/t h/o MR and flail mitral leaflets due to endocarditis: MRI safe  . TEE WITHOUT CARDIOVERSION N/A 03/13/2015   Procedure: TRANSESOPHAGEAL ECHOCARDIOGRAM (TEE);  Surgeon: DLarey Dresser MD;  [place]  Service: Cardiovascular;  Laterality: N/A;  . TRANSTHORACIC ECHOCARDIOGRAM  08/15/2012   EF=>55%; normal LV systolic function; RV mildly dilated & systolic function mildly reduced; RA mod dilated; mild -mod MR & mildy increased gradients; mild TR; AV mildly sclerotic with mild-mod regurg    There were no vitals filed for this visit.  Subjective Assessment - 03/07/18 0935    Subjective  Really have cut back on the stutter steps.    Patient is accompained by:  Family member wife     Patient Stated Goals  Pt's goals for therapy include how to start up walking.    Currently in Pain?  No/denies                       [facility]ercise - 03/08/18 0001      Transfers   Transfers  Sit to Stand;Stand to Sit    Sit to Stand  6: Modified independent (Device/Increase time);Without upper extremity assist;From bed    Five time sit to stand comments   12.75    Stand to Sit  6: Modified independent (Device/Increase time);Without upper extremity assist;To bed      Ambulation/Gait   Ambulation/Gait  Yes    Ambulation/Gait Assistance  6: Modified independent (Device/Increase time)    Ambulation Distance (Feet)  100 Feet x 4    Assistive device  None Carried cane into therapy    Gait velocity  10.03 sec = 3.27 ft/sec      Standardized Balance Assessment   Standardized Balance Assessment  Timed Up and Go Test      Timed Up and Go Test   TUG  Normal TUG;Cognitive TUG    Normal TUG (seconds)  12.12    Cognitive TUG (seconds)  16.69      High Level Balance   High Level Balance Comments  MiniBESTest score:  21/28  Self-Care   Self-Care  Other Self-Care Comments    Other Self-Care Comments   Pt and wife request D/C from PT today, as they will be going out of town and feel PT has been beneficial.  Discussed goals with patient, fall prevention, including review of tips to reduce freezing and need to SLOW pace of movement patterns.          [facility]) - 03/07/18 8676    PWR! exercises  Moves in standing    PWR! Up  x 20    PWR! Rock  x 20    PWR! Twist  x 20     PWR Step  x 20    Comments  Review of HEP-cues for slowed pacing       Balance Exercises - 03/08/18 1121      Balance Exercises: Standing   Wall Bumps  Hip    Wall Bumps-Hips  Eyes opened;Anterior/posterior;10 reps    Stepping Strategy  Anterior;Posterior;Lateral;10 reps;UE support    Heel Raises Limitations  2 sets x 10 reps    Toe Raise Limitations  2 sets x 10 reps          PT Education - 03/08/18 1117    Education provided  Yes    Education Details  Fall prevention, POC and plans for d/c, plans for return eval in 6 months.    Person(s) Educated  Patient;Spouse    Methods  Explanation    Comprehension  Verbalized understanding          PT Long Term Goals - 03/07/18 0942      PT LONG TERM GOAL #1   Title  Pt/wife will verbalize understanding of fall prevention within home environment. TARGET 03/24/18    Time  5    Period  Weeks    Status  Achieved      PT LONG TERM GOAL #2   Title  Pt will improve MiniBESTest to at least 20/28 for improved balance/decreased fall risk.    Baseline  21/28 03/07/18    Time  5    Period  Weeks    Status  Achieved      PT LONG TERM GOAL #3   Title  Pt/wife will verbalize understanding of tips to reduce freezing episodes with gait and turns.    Time  5    Period  Weeks    Status  Achieved      PT LONG TERM GOAL #4   Title  Pt will perform HEP with wife's supervision, for improved balance, functional mobility and posture.    Time  5    Period  Weeks    Status  Partially Met            Plan - 03/08/18 1118    Clinical Impression Statement  Pt has only been seen for 4 PT visits; however, pt and wife feel he has benefited from PT and would like to d/c today before going out of town.  He has met LTG 1-3 and LTG is partially met.  He has demonstrated and reports improvement in freezing episodes during course of physical therapy.  He still needs occasional cues to slow pace of exercise and movement patterns.      Rehab Potential  Good    PT Frequency  2x / week    PT Duration  Other (comment) 5 weeks    PT Treatment/Interventions  ADLs/Self Care Home Management;DME Instruction;Gait training;Stair training;Functional mobility training;Therapeutic activities;Therapeutic exercise;Balance training;Patient/family education;Neuromuscular re-education  PT Next Visit Plan  Plan for d/c this visit-recommend return PD  eval in 6 months due to progressive nature of disease    Consulted and Agree with Plan of Care  Patient;Family member/caregiver    Family Member Consulted  wife       Patient will benefit from skilled therapeutic intervention in order to improve the following deficits and impairments:  Abnormal gait, Decreased balance, Decreased mobility, Decreased safety awareness, Decreased strength, Difficulty walking, Postural dysfunction  Visit Diagnosis: Other symptoms and signs involving the nervous system  Unsteadiness on feet  Other abnormalities of gait and mobility     Problem List Patient Active Problem List   Diagnosis Date Noted  . Encounter for therapeutic drug monitoring 01/05/2016  . Paroxysmal atrial fibrillation ([place]) 12/24/2015  . Cerebral infarction due to embolism of left middle cerebral artery ([place]) 06/02/2015  . HLD (hyperlipidemia) 06/02/2015  . PD (Parkinson's disease) ([place]) 06/02/2015  . Mitral regurgitation 03/17/2015  . CVA (cerebral infarction)   . TIA (transient ischemic attack) 03/05/2015  . Slurred speech 03/05/2015  . Anxiety 12/19/2013  . Parkinson's disease ([place]) 08/06/2013  . Dyslipidemia 08/06/2013  . S/P mitral valve repair 08/06/2013    Atul Delucia W. 03/08/2018, 11:23 AM  Frazier Butt., PT  [facility] [address] [place], [place], [address] Phone: [phone]   Fax:  [phone]  Name: [name] [name] MRN: [id number] Date of Birth: [birth year]   PHYSICAL THERAPY DISCHARGE SUMMARY  Visits from Start of Care: 4  Current functional level related to goals / functional outcomes: PT Long Term Goals - 03/07/18 0942      PT LONG TERM GOAL #1   Title  Pt/wife will verbalize understanding of fall prevention within home environment. TARGET 03/24/18    Time  5    Period  Weeks    Status  Achieved      PT LONG TERM GOAL #2   Title  Pt will improve MiniBESTest to at least 20/28 for improved  balance/decreased fall risk.    Baseline  21/28 03/07/18    Time  5    Period  Weeks    Status  Achieved      PT LONG TERM GOAL #3   Title  Pt/wife will verbalize understanding of tips to reduce freezing episodes with gait and turns.    Time  5    Period  Weeks    Status  Achieved      PT LONG TERM GOAL #4   Title  Pt will perform HEP with wife's supervision, for improved balance, functional mobility and posture.    Time  5    Period  Weeks    Status  Partially Met      Pt has met 3 of 4 LTGs.   Remaining deficits: Timing and coordination, posture, freezing of gait episodes   Education / Equipment: Educated in fall prevention, tips to reduce freezing, HEP  Plan: Patient agrees to discharge.  Patient goals were partially met. Patient is being discharged due to being pleased with the current functional level.  ?????Recommend return PT eval in 6 months due to progressive nature of disease.  Mady Haagensen, PT 03/08/18 11:25 AM Phone: [phone] Fax: [phone]

## 2018-03-08 NOTE — Therapy (Signed)
[facility] [address] [place], [place], [address] Phone: [phone]   Fax:  [phone]  Occupational Therapy Treatment  Patient Details  Name: [name] [name] MRN: [id number] Date of Birth: July 09, 1932 Referring Provider: Dr. Linus Mako   Encounter Date: 03/07/2018  OT End of Session - 03/08/18 1632    Visit Number  5    Number of Visits  11    Date for OT Re-Evaluation  03/11/18    Authorization Type  UHC MCR    OT Start Time  0850    OT Stop Time  0930    OT Time Calculation (min)  40 min    Behavior During Therapy  [facility] for tasks assessed/performed       Past Medical History:  Diagnosis Date  . Dyslipidemia   . Parkinson's disease ([place])   . S/P mitral valve replacement    mitral regurg, endocarditis  . Stroke [facility])     Past Surgical History:  Procedure Laterality Date  . CATARACT EXTRACTION  2009, 2012   right 2009, left 2012  . LOOP RECORDER IMPLANT Left 03/13/2015   Procedure: LOOP RECORDER IMPLANT;  Surgeon: Evans Lance, MD;  Location: [facility] CATH LAB;  Service: Cardiovascular;  Laterality: Left;  . MITRAL VALVE ANNULOPLASTY  04/10/2007    34m Edwards ring; r/t h/o MR and flail mitral leaflets due to endocarditis: MRI safe  . TEE WITHOUT CARDIOVERSION N/A 03/13/2015   Procedure: TRANSESOPHAGEAL ECHOCARDIOGRAM (TEE);  Surgeon: DLarey Dresser MD;  [place]  Service: Cardiovascular;  Laterality: N/A;  . TRANSTHORACIC ECHOCARDIOGRAM  08/15/2012   EF=>55%; normal LV systolic function; RV mildly dilated & systolic function mildly reduced; RA mod dilated; mild -mod MR & mildy increased gradients; mild TR; AV mildly sclerotic with mild-mod regurg    There were no vitals filed for this visit.  Subjective Assessment - 03/08/18 1631    Subjective   denies pain    Pertinent History  PD, hx of CVA mitral valve replacement, dyslipidemia, a-fib    Patient Stated Goals  maintain independence    Currently  in Pain?  No/denies             Treatment: dynamic step and reach over target, min-mod v.c for larger amplitude movements with stepping and reaching. Therapist checked progress towards goals and reviewed with pt/ wife.              OT Education - 03/08/18 1632    Education provided  Yes    Education Details  Progress towards goals, plans for re-eval in 6 mons    Person(s) Educated  Patient;Spouse    Methods  Explanation;Demonstration    Comprehension  Verbalized understanding;Returned demonstration;Verbal cues required          OT Long Term Goals - 03/07/18 0853      OT LONG TERM GOAL #1   Title  Pt will verbalize understanding of strategies/AE to assist with ADLs/IADLs prn.--03/11/18    Status  Achieved      OT LONG TERM GOAL #2   Title  Pt / wife will verbalize understanding of compensatory strategies for short term memory deficits and ways to keep thinking skills sharp.    Status  Achieved      OT LONG TERM GOAL #3   Title  Pt will be I with updated PD specific HEP following review    Status  Achieved      OT LONG TERM GOAL #4  Title  Pt will demonstrate improved RUE fine motor coordination as evidenced by decreasing 9 hole peg test score by 3 secs    Status  Achieved 32.84      OT LONG TERM GOAL #5   Title  Pt will demonstrate improved ease with dressing as evidenced by decreasing 3 button/ unbutton time to 58 secs or less.    Status  Not Met greater than 2 mins            Plan - 03/08/18 1633    Clinical Impression Statement  Pt demonstrates progress towards goals. Pt/ wife request d/c today as they are going out of town.    Rehab Potential  Good    Current Impairments/barriers affecting progress:  cognitive deficits, decreased awareness of deficits    OT Frequency  2x / week    OT Duration  8 weeks    OT Treatment/Interventions  Self-care/ADL training;Therapeutic exercise;Visual/perceptual remediation/compensation;Patient/family  education;Neuromuscular education;Moist Heat;Therapeutic activities;Cognitive remediation/compensation;DME and/or AE instruction;Cryotherapy    Plan  reinforce ADL strategies, big movements with functional activity    Consulted and Agree with Plan of Care  Patient;Family member/caregiver    Family Member Consulted  wife       Patient will benefit from skilled therapeutic intervention in order to improve the following deficits and impairments:  Abnormal gait, Decreased coordination, Decreased range of motion, Difficulty walking, Impaired flexibility, Decreased safety awareness, Decreased endurance, Decreased activity tolerance, Decreased knowledge of precautions, Impaired tone, Impaired UE functional use, Pain, Decreased balance, Decreased cognition, Decreased mobility, Decreased strength, Impaired vision/preception  Visit Diagnosis: Other lack of coordination  Attention and concentration deficit  Other symptoms and signs involving the nervous system  Visuospatial deficit  Other abnormalities of gait and mobility  Abnormal posture  Other symptoms and signs involving the musculoskeletal system   OCCUPATIONAL THERAPY DISCHARGE SUMMARY    Current functional level related to goals / functional outcomes: Pt demonstrates progress towards goals, however he did not fully meet all goals due to cognitive deficits and early d/c.   Remaining deficits: Decreased balance, cognitive deficits, abnormal posture, decreased coordination, bradykinesia   Education / Equipment: Pt/ wife were educated in HEP, and adapted strategies for ADLs. Pt and wife verbalize understanding of all education.  Plan: Patient agrees to discharge.  Patient goals were partially met. Patient is being discharged due to the patient's request.  ?????      Problem List Patient Active Problem List   Diagnosis Date Noted  . Encounter for therapeutic drug monitoring 01/05/2016  . Paroxysmal atrial fibrillation ([place])  12/24/2015  . Cerebral infarction due to embolism of left middle cerebral artery ([place]) 06/02/2015  . HLD (hyperlipidemia) 06/02/2015  . PD (Parkinson's disease) ([place]) 06/02/2015  . Mitral regurgitation 03/17/2015  . CVA (cerebral infarction)   . TIA (transient ischemic attack) 03/05/2015  . Slurred speech 03/05/2015  . Anxiety 12/19/2013  . Parkinson's disease ([place]) 08/06/2013  . Dyslipidemia 08/06/2013  . S/P mitral valve repair 08/06/2013    Geraldyn Shain 03/08/2018, 4:34 PM Theone Murdoch, OTR/L [phone] Phone: [phone] 4:35 PM 03/08/18 [facility] [address] [place], [place], [address] Phone: [phone]   Fax:  [phone]  Name: [name] [name] MRN: [id number] Date of Birth: 1931-12-08

## 2018-03-09 ENCOUNTER — Encounter: Payer: Medicare Other | Admitting: Speech Pathology

## 2018-03-09 LAB — CUP PACEART REMOTE DEVICE CHECK
Date Time Interrogation Session: 20190311034034
Implantable Pulse Generator Implant Date: 20160421

## 2018-03-20 ENCOUNTER — Telehealth: Payer: Self-pay | Admitting: Pharmacist

## 2018-03-20 NOTE — Telephone Encounter (Signed)
Reports knee injury caused fluid retention and blood collection on knee joint.   Evaluated and treated this morning by Orthopaedic MD. Blood aspirated. Per MD blood related to injury and not to warfarin.  Patient's wife calling for recommendations:  1. Noted hx of TIA/stroke and recent INR reported as : 2.1, 2.0 and 2.0  2. Continue taking warfarin dose as prescribed. Follow Orthopaedic doctor recommendations.  3. F/U with coumadin clinic in 2 weeks (okay to follow up this week but patient refuses this option).

## 2018-04-04 ENCOUNTER — Ambulatory Visit (INDEPENDENT_AMBULATORY_CARE_PROVIDER_SITE_OTHER): Payer: Medicare Other | Admitting: Pharmacist Clinician (PhC)/ Clinical Pharmacy Specialist

## 2018-04-04 DIAGNOSIS — G458 Other transient cerebral ischemic attacks and related syndromes: Secondary | ICD-10-CM

## 2018-04-04 DIAGNOSIS — G459 Transient cerebral ischemic attack, unspecified: Secondary | ICD-10-CM

## 2018-04-04 DIAGNOSIS — I480 Paroxysmal atrial fibrillation: Secondary | ICD-10-CM

## 2018-04-04 DIAGNOSIS — I63412 Cerebral infarction due to embolism of left middle cerebral artery: Secondary | ICD-10-CM | POA: Diagnosis not present

## 2018-04-04 DIAGNOSIS — Z5181 Encounter for therapeutic drug level monitoring: Secondary | ICD-10-CM

## 2018-04-04 DIAGNOSIS — Z9889 Other specified postprocedural states: Secondary | ICD-10-CM

## 2018-04-04 LAB — POCT INR: INR: 1.6

## 2018-04-05 ENCOUNTER — Ambulatory Visit (INDEPENDENT_AMBULATORY_CARE_PROVIDER_SITE_OTHER): Payer: Medicare Other | Admitting: *Deleted

## 2018-04-05 DIAGNOSIS — I480 Paroxysmal atrial fibrillation: Secondary | ICD-10-CM

## 2018-04-05 DIAGNOSIS — I639 Cerebral infarction, unspecified: Secondary | ICD-10-CM

## 2018-04-05 LAB — CUP PACEART REMOTE DEVICE CHECK
Date Time Interrogation Session: 20190413033821
MDC IDC PG IMPLANT DT: 20160421

## 2018-04-06 NOTE — Progress Notes (Signed)
Carelink Summary Report / Loop Recorder 

## 2018-04-12 NOTE — Progress Notes (Signed)
Cardiology Office Note Date:  04/13/2018  Patient ID:  [name] [name], DOB 12-Jan-1932, MRN [id number] PCP:  Rodrigo Ran, MD  Cardiologist:  Dr. Okey Dupre Electrophysiologist: Dr. Ladona Ridgel   Chief Complaint: annual EP visit  History of Present Illness: [name] [name] is a 84 y.o. male with history of PAF noted by ILR post cryptogenic CVA), endocarditis s/p MV repair (2008), HLD, Parkinson's disease.  He comes in today to be seen for Dr Ladona Ridgel, last seen by him in April 2018.  At that visit his BP was somewhat low with c/o dizziness, his dilt was stopped and recommended to resume should his BP become elevated.  He is accompanied by his wife.  He has healing  abrasions left knee/leg from a trip/fall he had while pulling his lawn mower.  He has a hematome R knee cap, this is from a few weeks ago, tripped and fell on his knee.  He saw orthopedic, had it drained, was described as bloody and recommended to have his INR checked, his wife states it was and was 1.6. They were advised by the orthopedic MD to keep it wrapped with an ACE, use heat/ise for comfort and would likely take a few months to resolve.  He denies any CP, palpitations or SOB. No near syncope or syncope.  In regards to falls, both the patient and wife state that this is unusual and very infrequent.  He does on occasion when first getting up in the AM have bikt of transient lightheadedness.  Outside of his knee (that was traumatic), no reports of bleeding or signs of bleeding  Device information: MDT ILR, implanted 03/13/15, cryptogenic stroke  Past Medical History:  Diagnosis Date  . Dyslipidemia   . Parkinson's disease (HCC)   . S/P mitral valve replacement    mitral regurg, endocarditis  . Stroke [facility])     Past Surgical History:  Procedure Laterality Date  . CATARACT EXTRACTION  2009, 2012   right 2009, left 2012  . LOOP RECORDER IMPLANT Left 03/13/2015   Procedure: LOOP RECORDER IMPLANT;  Surgeon: Marinus Maw, MD;  Location: [facility] CATH  LAB;  Service: Cardiovascular;  Laterality: Left;  . 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 TEE WITHOUT CARDIOVERSION N/A 03/13/2015   Procedure: TRANSESOPHAGEAL ECHOCARDIOGRAM (TEE);  Surgeon: Laurey Morale, MD;  Location: [facility] ENDOSCOPY;  Service: Cardiovascular;  Laterality: N/A;  . TRANSTHORACIC ECHOCARDIOGRAM  08/15/2012   EF=>55%; normal LV systolic function; RV mildly dilated & systolic function mildly reduced; RA mod dilated; mild -mod MR & mildy increased gradients; mild TR; AV mildly sclerotic with mild-mod regurg    Current Outpatient Medications  Medication Sig Dispense Refill  . acetaminophen (TYLENOL) 325 MG tablet Take 325 mg by mouth every 6 (six) hours as needed.    Marland Kitchen amantadine (SYMMETREL) 100 MG capsule Take 100 mg by mouth 2 (two) times daily.    Marland Kitchen amoxicillin (AMOXIL) 500 MG tablet Take 1 tablet (500 mg total) by mouth 2 (two) times daily. (Patient taking differently: Take 500 mg by mouth 2 (two) times daily. Prior to dental work) 8 tablet 3  . atorvastatin (LIPITOR) 20 MG tablet Take 20 mg by mouth daily at 6 PM.     . cholecalciferol (VITAMIN D) 1000 UNITS tablet Take 1,000 Units by mouth daily.    Tery Sanfilippo Calcium (STOOL SOFTENER PO) Take 1 capsule by mouth daily as needed (  constipation).     Marland Kitchen donepezil (ARICEPT) 10 MG tablet Take 1 tablet by mouth at bedtime.    Marland Kitchen escitalopram (LEXAPRO) 10 MG tablet Take 10 mg by mouth daily.    Marland Kitchen ibandronate (BONIVA) 150 MG tablet Take 1 tablet by mouth every 30 (thirty) days.    . Multiple Vitamins-Minerals (MULTIVITAMIN PO) Take 1 tablet by mouth daily.     . rasagiline (AZILECT) 1 MG TABS tablet Take 1 mg by mouth every morning. .    . rotigotine (NEUPRO) 4 MG/24HR Place 1 patch onto the skin daily.    Marland Kitchen RYTARY 48.75-195 MG CPCR Take 9 capsules by mouth See admin instructions. 3 caps in the morning with Azelect, 3 caps at 1pm with Amantadine,  2 caps at 6pm with Amantadine and 1 cap at bedtime.    . vitamin B-12 (CYANOCOBALAMIN) 1000 MCG tablet Take 1,000 mcg by mouth as directed.     . vitamin E 400 UNIT capsule Take 400 Units by mouth daily.    Marland Kitchen warfarin (COUMADIN) 5 MG tablet TAKE 1/2 to 1 tablet daily as directed by coumadin clinic 90 tablet 0   No current facility-administered medications for this visit.     Allergies:   Ace inhibitors; Demerol [meperidine]; and Poison ivy extract   Social History:  The patient  reports that he has never smoked. He has never used smokeless tobacco. He reports that he drinks about 1.2 oz of alcohol per week. He reports that he does not use drugs.   Family History:  The patient's family history includes Heart attack in his father and mother; Hypertension in his brother; Prostate cancer in his father; Stroke in his mother; Valvular heart disease in his brother.  ROS:  Please see the history of present illness. All other systems are reviewed and otherwise negative.   PHYSICAL EXAM:  VS:  BP 114/70   Pulse 87   Ht  (1.778 m)   Wt 187 lb 12.8 oz (85.2 kg)   SpO2 95%   BMI 26.95 kg/m  BMI: Body mass index is 26.95 kg/m. Well nourished, well developed, in no acute distress  HEENT: normocephalic, atraumatic  Neck: no JVD, carotid bruits or masses Cardiac:  RRR; 1-2/6SM, no rubs, or gallops Lungs:  CTA b/l , no wheezing, rhonchi or rales  Abd: soft, nontender MS: no deformity, age appropriate atrophy Ext: no edema, small hematoma to R knee, healing abrasion left lateral leg/knee Skin: warm and dry, no rash Neuro:  No gross deficits appreciated Psych: euthymic mood, full affect  ILR site is stable, no tethering or discomfort   EKG:  Not done today ILR interrogation done today and reviewed by myself.  No new episodes, battery si good, R waves 0.48  09/15/15: TTE Study Conclusions - Left ventricle: The cavity size was normal. There was moderate   concentric hypertrophy.  Systolic function was normal. The   estimated ejection fraction was in the range of 55% to 60%. Wall   motion was normal; there were no regional wall motion   abnormalities. The study is not technically sufficient to allow   evaluation of LV diastolic function. - Aortic valve: Trileaflet. There was no stenosis. There was   trivial regurgitation. - Mitral valve: Annuloplasty ring in place. No significant   stenosis. Trivial regurgitation. - Left atrium: Massively dilated at 85 ml/m2. - Right ventricle: The cavity size was mildly dilated. Wall   thickness was normal. The moderator band was prominent. Systolic  function was normal. - Right atrium: Severely dilated at 30 cm2. - Tricuspid valve: There was mild regurgitation. - Pulmonary arteries: PA peak pressure: 30 mm Hg (S). - Inferior vena cava: The vessel was normal in size. The   respirophasic diameter changes were in the normal range (>= 50%),   consistent with normal central venous pressure. Impressions: - Compared to the prior study in 02/2015, the mitral valve repair is   stable - the degree of MR is now mild, with a single, posteriorly   directed jet.   Recent Labs: No results found for requested labs within last 8760 hours.  No results found for requested labs within last 8760 hours.   CrCl cannot be calculated (Patient's most recent lab result is older than the maximum 21 days allowed.).   Wt Readings from Last 3 Encounters:  04/13/18 187 lb 12.8 oz (85.2 kg)  12/29/17 184 lb 9.6 oz (83.7 kg)  03/08/17 178 lb 12.8 oz (81.1 kg)     Other studies reviewed: Additional studies/records reviewed today include: summarized above  ASSESSMENT AND PLAN:  1. Paroxysmal AFib     CHA2DS2Vasc is at least 2, on Warfarin (given MVrepair)     Monitored and managed by the coumadin clinic  Small hematoma R knee, no skin changes, no erythema, heat to surrounding tissues, eported as stable in size, non-painful Follow with orthopedic  MD recommendations, discussed if increasing in size to let us know  He has had a couple falls.  Given his Parkinson's this is somewhat worrisome, though they report this as unusual and infrequent.  Continue warfarin for now.  They report labs done about 6--7 months ago at PMD, will request labs.  2. VHD     Hx of endocaditis, MV repair     C/w Dr. Okey Dupre       3. HTN ?     BP is OK here today, not on any anti-hypertensive meds  4. Some description of early AM orthostasis     Discussed at length to get up slowly, sit at edge of bed a few minutes before standing, adequate hydration, safety   Disposition: F/u with monthly loop transmissions, in-clinic in 1 year, sooner if needed  Current medicines are reviewed at length with the patient today.  The patient did not have any concerns regarding medicines.  Norma Fredrickson, PA-C 04/13/2018 2:25 PM     [facility] [address] [place] [address] [phone] (office)  [phone] (fax)

## 2018-04-13 ENCOUNTER — Encounter: Payer: Self-pay | Admitting: Physician Assistant

## 2018-04-13 ENCOUNTER — Ambulatory Visit: Payer: Medicare Other | Admitting: Physician Assistant

## 2018-04-13 VITALS — BP 114/70 | HR 87 | Ht 70.0 in | Wt 187.8 lb

## 2018-04-13 DIAGNOSIS — I480 Paroxysmal atrial fibrillation: Secondary | ICD-10-CM | POA: Diagnosis not present

## 2018-04-13 DIAGNOSIS — Z4509 Encounter for adjustment and management of other cardiac device: Secondary | ICD-10-CM | POA: Diagnosis not present

## 2018-04-13 DIAGNOSIS — Z9889 Other specified postprocedural states: Secondary | ICD-10-CM | POA: Diagnosis not present

## 2018-04-13 NOTE — Patient Instructions (Signed)
Medication Instructions:   Your physician recommends that you continue on your current medications as directed. Please refer to the Current Medication list given to you today.   If you need a refill on your cardiac medications before your next appointment, please call your pharmacy.  Labwork: NONE ORDERED  TODAY    Testing/Procedures: NONE ORDERED  TODAY    Follow-Up: Your physician wants you to follow-up in: ONE YEAR WITH  TAYLOR URSUY  You will receive a reminder letter in the mail two months in advance. If you don't receive a letter, please call our office to schedule the follow-up appointment.     Any Other Special Instructions Will Be Listed Below (If Applicable).                                                                                                                                                   

## 2018-04-19 ENCOUNTER — Ambulatory Visit (INDEPENDENT_AMBULATORY_CARE_PROVIDER_SITE_OTHER): Payer: Medicare Other | Admitting: Pharmacist Clinician (PhC)/ Clinical Pharmacy Specialist

## 2018-04-19 DIAGNOSIS — I63412 Cerebral infarction due to embolism of left middle cerebral artery: Secondary | ICD-10-CM

## 2018-04-19 DIAGNOSIS — G459 Transient cerebral ischemic attack, unspecified: Secondary | ICD-10-CM

## 2018-04-19 DIAGNOSIS — Z9889 Other specified postprocedural states: Secondary | ICD-10-CM | POA: Diagnosis not present

## 2018-04-19 DIAGNOSIS — I480 Paroxysmal atrial fibrillation: Secondary | ICD-10-CM | POA: Diagnosis not present

## 2018-04-19 DIAGNOSIS — Z5181 Encounter for therapeutic drug level monitoring: Secondary | ICD-10-CM

## 2018-04-19 DIAGNOSIS — G458 Other transient cerebral ischemic attacks and related syndromes: Secondary | ICD-10-CM | POA: Diagnosis not present

## 2018-04-19 LAB — POCT INR: INR: 3 (ref 2.0–3.0)

## 2018-04-19 NOTE — Patient Instructions (Signed)
Description   Continue with 1 tablet each Tuesday and Friday, 1/2 tablet all other days.  Repeat INR in 3 weeks

## 2018-04-28 LAB — CUP PACEART REMOTE DEVICE CHECK
Implantable Pulse Generator Implant Date: 20160421
MDC IDC SESS DTM: 20190516044054

## 2018-05-01 ENCOUNTER — Telehealth: Payer: Self-pay | Admitting: *Deleted

## 2018-05-01 NOTE — Telephone Encounter (Signed)
Spoke with patient's wife regarding LINQ at RRT since June 7th, 2019. Discussed the next steps with wife regarding LINQ explant vs. Leaving it in place. She said they would discuss the options and call us back if they decide to get it explanted. Advised patient's wife [employer] would send a return kit for the [facility] monitor.

## 2018-05-04 NOTE — Telephone Encounter (Signed)
Patient's wife called stating that patient does not wish to have ILR explanted.  Wife aware that they should be receiving a return kit for patient's CL monitor within the next 6 weeks. Encouraged wife/patient to call to schedule an appt if they wish to have the device explanted in the future. Wife verbalized understanding.  Will inform Domingo Dimes, RN of patient's decision.

## 2018-05-09 ENCOUNTER — Ambulatory Visit (INDEPENDENT_AMBULATORY_CARE_PROVIDER_SITE_OTHER): Payer: Medicare Other | Admitting: Pharmacist

## 2018-05-09 DIAGNOSIS — Z5181 Encounter for therapeutic drug level monitoring: Secondary | ICD-10-CM

## 2018-05-09 DIAGNOSIS — I480 Paroxysmal atrial fibrillation: Secondary | ICD-10-CM

## 2018-05-09 DIAGNOSIS — Z9889 Other specified postprocedural states: Secondary | ICD-10-CM | POA: Diagnosis not present

## 2018-05-09 DIAGNOSIS — G458 Other transient cerebral ischemic attacks and related syndromes: Secondary | ICD-10-CM | POA: Diagnosis not present

## 2018-05-09 DIAGNOSIS — I63412 Cerebral infarction due to embolism of left middle cerebral artery: Secondary | ICD-10-CM | POA: Diagnosis not present

## 2018-05-09 DIAGNOSIS — G459 Transient cerebral ischemic attack, unspecified: Secondary | ICD-10-CM

## 2018-05-09 LAB — POCT INR: INR: 2.7 (ref 2.0–3.0)

## 2018-05-09 MED ORDER — WARFARIN SODIUM 5 MG PO TABS: ORAL_TABLET | ORAL | 1 refills | Status: DC

## 2018-06-05 ENCOUNTER — Ambulatory Visit (INDEPENDENT_AMBULATORY_CARE_PROVIDER_SITE_OTHER): Payer: Medicare Other | Admitting: Pharmacist

## 2018-06-05 DIAGNOSIS — Z5181 Encounter for therapeutic drug level monitoring: Secondary | ICD-10-CM | POA: Diagnosis not present

## 2018-06-05 DIAGNOSIS — G458 Other transient cerebral ischemic attacks and related syndromes: Secondary | ICD-10-CM | POA: Diagnosis not present

## 2018-06-05 DIAGNOSIS — Z9889 Other specified postprocedural states: Secondary | ICD-10-CM

## 2018-06-05 DIAGNOSIS — I480 Paroxysmal atrial fibrillation: Secondary | ICD-10-CM | POA: Diagnosis not present

## 2018-06-05 DIAGNOSIS — I63412 Cerebral infarction due to embolism of left middle cerebral artery: Secondary | ICD-10-CM | POA: Diagnosis not present

## 2018-06-05 DIAGNOSIS — G459 Transient cerebral ischemic attack, unspecified: Secondary | ICD-10-CM

## 2018-06-05 LAB — POCT INR: INR: 4.4 — AB (ref 2.0–3.0)

## 2018-06-26 ENCOUNTER — Ambulatory Visit (INDEPENDENT_AMBULATORY_CARE_PROVIDER_SITE_OTHER): Payer: Medicare Other | Admitting: Pharmacist

## 2018-06-26 DIAGNOSIS — G458 Other transient cerebral ischemic attacks and related syndromes: Secondary | ICD-10-CM | POA: Diagnosis not present

## 2018-06-26 DIAGNOSIS — I480 Paroxysmal atrial fibrillation: Secondary | ICD-10-CM | POA: Diagnosis not present

## 2018-06-26 DIAGNOSIS — Z5181 Encounter for therapeutic drug level monitoring: Secondary | ICD-10-CM | POA: Diagnosis not present

## 2018-06-26 DIAGNOSIS — Z9889 Other specified postprocedural states: Secondary | ICD-10-CM

## 2018-06-26 DIAGNOSIS — G459 Transient cerebral ischemic attack, unspecified: Secondary | ICD-10-CM

## 2018-06-26 DIAGNOSIS — I63412 Cerebral infarction due to embolism of left middle cerebral artery: Secondary | ICD-10-CM

## 2018-06-26 LAB — POCT INR: INR: 3.4 — AB (ref 2.0–3.0)

## 2018-07-11 ENCOUNTER — Other Ambulatory Visit: Payer: Self-pay | Admitting: Internal Medicine

## 2018-07-19 ENCOUNTER — Ambulatory Visit (INDEPENDENT_AMBULATORY_CARE_PROVIDER_SITE_OTHER): Payer: Medicare Other | Admitting: Pharmacist Clinician (PhC)/ Clinical Pharmacy Specialist

## 2018-07-19 DIAGNOSIS — Z9889 Other specified postprocedural states: Secondary | ICD-10-CM

## 2018-07-19 DIAGNOSIS — I63412 Cerebral infarction due to embolism of left middle cerebral artery: Secondary | ICD-10-CM | POA: Diagnosis not present

## 2018-07-19 DIAGNOSIS — G458 Other transient cerebral ischemic attacks and related syndromes: Secondary | ICD-10-CM

## 2018-07-19 DIAGNOSIS — I480 Paroxysmal atrial fibrillation: Secondary | ICD-10-CM

## 2018-07-19 DIAGNOSIS — G459 Transient cerebral ischemic attack, unspecified: Secondary | ICD-10-CM

## 2018-07-19 DIAGNOSIS — Z5181 Encounter for therapeutic drug level monitoring: Secondary | ICD-10-CM

## 2018-07-19 LAB — POCT INR: INR: 1.4 — AB (ref 2.0–3.0)

## 2018-07-19 NOTE — Patient Instructions (Signed)
Description   Take 1.5 tablets today Wednesday Aug 28, then increase to  1 tablet each Tuesday and Friday,  1/2 tablet all other days.  Repeat INR in 2 weeks

## 2018-08-02 ENCOUNTER — Ambulatory Visit (INDEPENDENT_AMBULATORY_CARE_PROVIDER_SITE_OTHER): Payer: Medicare Other | Admitting: Pharmacist

## 2018-08-02 DIAGNOSIS — I480 Paroxysmal atrial fibrillation: Secondary | ICD-10-CM | POA: Diagnosis not present

## 2018-08-02 DIAGNOSIS — I63412 Cerebral infarction due to embolism of left middle cerebral artery: Secondary | ICD-10-CM

## 2018-08-02 DIAGNOSIS — Z5181 Encounter for therapeutic drug level monitoring: Secondary | ICD-10-CM | POA: Diagnosis not present

## 2018-08-02 DIAGNOSIS — Z9889 Other specified postprocedural states: Secondary | ICD-10-CM

## 2018-08-02 DIAGNOSIS — G458 Other transient cerebral ischemic attacks and related syndromes: Secondary | ICD-10-CM

## 2018-08-02 DIAGNOSIS — G459 Transient cerebral ischemic attack, unspecified: Secondary | ICD-10-CM

## 2018-08-02 LAB — POCT INR: INR: 2.3 (ref 2.0–3.0)

## 2018-08-25 ENCOUNTER — Ambulatory Visit: Payer: Medicare Other | Admitting: Pharmacist

## 2018-08-25 DIAGNOSIS — Z5181 Encounter for therapeutic drug level monitoring: Secondary | ICD-10-CM | POA: Diagnosis not present

## 2018-08-25 DIAGNOSIS — Z9889 Other specified postprocedural states: Secondary | ICD-10-CM

## 2018-08-25 DIAGNOSIS — I63412 Cerebral infarction due to embolism of left middle cerebral artery: Secondary | ICD-10-CM

## 2018-08-25 DIAGNOSIS — I480 Paroxysmal atrial fibrillation: Secondary | ICD-10-CM

## 2018-08-25 DIAGNOSIS — G458 Other transient cerebral ischemic attacks and related syndromes: Secondary | ICD-10-CM

## 2018-08-25 DIAGNOSIS — G459 Transient cerebral ischemic attack, unspecified: Secondary | ICD-10-CM

## 2018-08-25 LAB — POCT INR: INR: 2.8 (ref 2.0–3.0)

## 2018-09-05 ENCOUNTER — Encounter: Payer: Self-pay | Admitting: Physical Therapy

## 2018-09-05 ENCOUNTER — Ambulatory Visit: Payer: Medicare Other

## 2018-09-05 ENCOUNTER — Ambulatory Visit: Payer: Medicare Other | Admitting: Occupational Therapy

## 2018-09-05 ENCOUNTER — Ambulatory Visit: Payer: Medicare Other | Attending: Neurology | Admitting: Physical Therapy

## 2018-09-05 DIAGNOSIS — R471 Dysarthria and anarthria: Secondary | ICD-10-CM | POA: Insufficient documentation

## 2018-09-05 DIAGNOSIS — R29898 Other symptoms and signs involving the musculoskeletal system: Secondary | ICD-10-CM

## 2018-09-05 DIAGNOSIS — R293 Abnormal posture: Secondary | ICD-10-CM | POA: Insufficient documentation

## 2018-09-05 DIAGNOSIS — R29818 Other symptoms and signs involving the nervous system: Secondary | ICD-10-CM

## 2018-09-05 DIAGNOSIS — R41842 Visuospatial deficit: Secondary | ICD-10-CM | POA: Insufficient documentation

## 2018-09-05 DIAGNOSIS — R278 Other lack of coordination: Secondary | ICD-10-CM

## 2018-09-05 DIAGNOSIS — R2689 Other abnormalities of gait and mobility: Secondary | ICD-10-CM | POA: Diagnosis present

## 2018-09-05 DIAGNOSIS — R41840 Attention and concentration deficit: Secondary | ICD-10-CM

## 2018-09-05 DIAGNOSIS — M79602 Pain in left arm: Secondary | ICD-10-CM | POA: Diagnosis present

## 2018-09-05 DIAGNOSIS — R2681 Unsteadiness on feet: Secondary | ICD-10-CM | POA: Diagnosis present

## 2018-09-05 NOTE — Therapy (Signed)
[facility] [address] [place], [place], [address] Phone: [phone]   Fax:  [phone]  Speech Language Pathology Evaluation  Patient Details  Name: [name] [name] MRN: [id number] Date of Birth: 1932/03/12 Referring Provider (SLP): Dr. Jaquita Folds   Encounter Date: 09/05/2018  End of Session - 09/05/18 1217    Visit Number  1    Number of Visits  17    Date for SLP Re-Evaluation  11/27/18    SLP Start Time  1100    SLP Stop Time   1147    SLP Time Calculation (min)  47 min    Activity Tolerance  Patient tolerated treatment well       Past Medical History:  Diagnosis Date  . Dyslipidemia   . Parkinson's disease (HCC)   . S/P mitral valve replacement    mitral regurg, endocarditis  . Stroke [facility])     Past Surgical History:  Procedure Laterality Date  . CATARACT EXTRACTION  2009, 2012   right 2009, left 2012  . LOOP RECORDER IMPLANT Left 03/13/2015   Procedure: LOOP RECORDER IMPLANT;  Surgeon: Marinus Maw, MD;  Location: [facility] CATH LAB;  Service: Cardiovascular;  Laterality: Left;  . 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 TEE WITHOUT CARDIOVERSION N/A 03/13/2015   Procedure: TRANSESOPHAGEAL ECHOCARDIOGRAM (TEE);  Surgeon: Laurey Morale, MD;  Location: [facility] ENDOSCOPY;  Service: Cardiovascular;  Laterality: N/A;  . TRANSTHORACIC ECHOCARDIOGRAM  08/15/2012   EF=>55%; normal LV systolic function; RV mildly dilated & systolic function mildly reduced; RA mod dilated; mild -mod MR & mildy increased gradients; mild TR; AV mildly sclerotic with mild-mod regurg    There were no vitals filed for this visit.  Subjective Assessment - 09/05/18 1109    Subjective  "[name] [name] you have changed a lot!"    Currently in Pain?  Yes    Pain Score  3     Pain Location  Shoulder    Pain Orientation  Left    Pain Descriptors / Indicators   Aching    Pain Onset  More than a month ago         SLP Evaluation [facility] - 09/05/18 1109      SLP Visit Information   SLP Received On  09/05/18    Referring Provider (SLP)  Dr. Jaquita Folds    Onset Date  2004    Medical Diagnosis  PD      General Information   HPI  [name] [name] is an 84 y/o male who has been diagnosed with PD since 2004. Two previous courses of ST treatment were short secondary to scheduling preferences. He was last seen in Spring of 2019 and required visual cues to maintain normal loudness.      Prior Functional Status   Cognitive/Linguistic Baseline  Baseline deficits    Baseline deficit details  memory, attention, executive function    Type of Home  House     Lives With  Spouse    Available Support  Friend(s)      Cognition   Overall Cognitive Status  History of cognitive impairments - at baseline    Area of Impairment  Memory;Attention;Safety/judgement;Awareness;Problem solving    Attention  --   Pt easily diverted from targetted topic   Memory  --   noted wife correcting Pt's recollection of recent family hx  Oral Motor/Sensory Function   Labial ROM  Within Functional Limits    Labial Symmetry  Within Functional Limits    Labial Sensation  Within Functional Limits    Labial Coordination  Reduced    Lingual ROM  Within Functional Limits    Lingual Symmetry  Within Functional Limits    Lingual Strength  Reduced Right    Lingual Sensation  Within Functional Limits    Lingual Coordination  WFL    Facial ROM  Within Functional Limits    Velum  Within Functional Limits      Motor Speech   Overall Motor Speech  Impaired    Respiration  Impaired    Level of Impairment  Word    Phonation  Low vocal intensity   average low - mid 60s dB simple conv.    Resonance  Within functional limits    Articulation  Impaired   rare short rushes of speech   Level of Impairment  Sentence    Intelligibility  Intelligibility reduced    Word  --   100%    Phrase  --   95+%   Sentence  75-100% accurate    Conversation  75-100% accurate    Interfering Components  --   cognitive baseline deficits   Effective Techniques  Increased vocal intensity                       SLP Education - 09/05/18 1214    Education provided  Yes    Education Details  Pt has stopped completing HEP; encouraged to initiate completing HEP loud "Ah"s daily and abdominal push/breathing reviewed    Person(s) Educated  Patient;Spouse    Methods  Explanation    Comprehension  Verbalized understanding       SLP Short Term Goals - 09/05/18 1233      SLP SHORT TERM GOAL #1   Title  pt will maintain loud /a/ or "HEY - Ah" average 90dB X 3 sessions    Time  4    Period  Weeks    Status  New      SLP SHORT TERM GOAL #2   Title  pt will demo 17/20 sentences with average low 70s dB x2 sessions    Time  4    Period  Weeks    Status  New      SLP SHORT TERM GOAL #3   Title  Pt will demonstrate WNL rate of speech 15/20 sentneces with occasional min A over 2 sessions    Baseline  --    Time  4    Period  Weeks    Status  New      SLP SHORT TERM GOAL #4   Title  Spouse and or Pt will tell SLP 3 ways to compensate for reduced attention    Time  4    Period  Weeks    Status  New       SLP Long Term Goals - 09/05/18 1234      SLP LONG TERM GOAL #1   Title  pt will demo loudness average low 70s dB  in 10 minutes simple conversation with rare visual cues x3 sessions    Time  8    Period  Weeks    Status  New      SLP LONG TERM GOAL #2   Title  Pt will be 95% intelligible over 8 minute simple conversation in mildly noisy environment  with occasional min A over 2 sessions    Time  8    Period  Weeks    Status  New                            Plan - 09/05/18 1218    Clinical Impression Statement  Pt presented today with dysarthria characterized by decreased breath support and imprecise consonants which wife reports frequently decrease  Pt's intelligibility at home requiring her to ask Pt to repeat himself numerous times. Pt was stimulable for more intelligible speech at the conversation level with familiar topics after performing repitions of loud "Ah" and loud "HEY-Ah". Pt will benefit from skilled ST in order to increase communication effectiveness.    Speech Therapy Frequency  2x / week    Duration  --   8 weeks or 17 visits   Treatment/Interventions  SLP instruction and feedback;Compensatory strategies;Patient/family education;Multimodal communcation approach;Internal/external aids;Cognitive reorganization;Functional tasks;Cueing hierarchy;Environmental controls   loud "Ah"/Abdominal Breathing   Potential to Achieve Goals  Good    Potential Considerations  Cooperation/participation level;Ability to learn/carryover information   Pt shows to schedule less than recommended frequency duration   Consulted and Agree with Plan of Care  Patient;Family member/caregiver    Family Member Consulted  wife       Patient will benefit from skilled therapeutic intervention in order to improve the following deficits and impairments:   Dysarthria and anarthria - Plan: SLP plan of care cert/re-cert    Problem List Patient Active Problem List   Diagnosis Date Noted  . Encounter for therapeutic drug monitoring 01/05/2016  . Paroxysmal atrial fibrillation (HCC) 12/24/2015  . 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 HLD (hyperlipidemia) 06/02/2015  . PD (Parkinson's disease) (HCC) 06/02/2015  . Mitral regurgitation 03/17/2015  . CVA (cerebral infarction)   . TIA (transient ischemic attack) 03/05/2015  . Slurred speech 03/05/2015  . Anxiety 12/19/2013  . Parkinson's disease (HCC) 08/06/2013  . Dyslipidemia 08/06/2013  . S/P mitral valve repair 08/06/2013   Maia Handa H. Romie Levee, CCC-SLP Speech Language Pathologist  Georgetta Haber 09/05/2018, 12:48 PM  [facility] [address] [place], [place], [address] Phone: [phone]   Fax:  [phone]  Name: [name] [name] MRN: [id number] Date of Birth: February 01, 1932

## 2018-09-06 NOTE — Therapy (Signed)
[facility] [address] [place], [place], [address] Phone: [phone]   Fax:  [phone]  Physical Therapy Evaluation  Patient Details  Name: [name] [name] MRN: [id number] Date of Birth: 03-Jul-1932 Referring Provider (PT): Dr. Rubin Payor   Encounter Date: 09/05/2018  PT End of Session - 09/06/18 2230    Visit Number  1    Number of Visits  9   New POC 09/05/18   Date for PT Re-Evaluation  11/04/18    Authorization Type  [employer] Medicare    PT Start Time  0936    PT Stop Time  1015    PT Time Calculation (min)  39 min    Activity Tolerance  Patient tolerated treatment well    Behavior During Therapy  [facility] for tasks assessed/performed       Past Medical History:  Diagnosis Date  . Dyslipidemia   . Parkinson's disease (HCC)   . S/P mitral valve replacement    mitral regurg, endocarditis  . Stroke [facility])     Past Surgical History:  Procedure Laterality Date  . CATARACT EXTRACTION  2009, 2012   right 2009, left 2012  . LOOP RECORDER IMPLANT Left 03/13/2015   Procedure: LOOP RECORDER IMPLANT;  Surgeon: Marinus Maw, MD;  Location: [facility] CATH LAB;  Service: Cardiovascular;  Laterality: Left;  . 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 TEE WITHOUT CARDIOVERSION N/A 03/13/2015   Procedure: TRANSESOPHAGEAL ECHOCARDIOGRAM (TEE);  Surgeon: Laurey Morale, MD;  Location: [facility] ENDOSCOPY;  Service: Cardiovascular;  Laterality: N/A;  . TRANSTHORACIC ECHOCARDIOGRAM  08/15/2012   EF=>55%; normal LV systolic function; RV mildly dilated & systolic function mildly reduced; RA mod dilated; mild -mod MR & mildy increased gradients; mild TR; AV mildly sclerotic with mild-mod regurg    There were no vitals filed for this visit.   Subjective Assessment - 09/06/18 2221    Subjective  Patient feels like he's doing well.  Wife feels like he is going forward  too much with posture.  Wife reports he's doing a lot of stutter stepping.  No falls in the past 6 months.  Uses cane for mobility.    Patient is accompained by:  Family member   wife, [name]   Patient Stated Goals  When asked about goals for therapy:  "I'm not sure, I feel pretty good about things."  Wife reports posture and lessening stutter-stepping.    Currently in Pain?  Yes    Pain Score  3     Pain Location  Shoulder    Pain Orientation  Left    Pain Descriptors / Indicators  Aching    Pain Type  Chronic pain    Pain Onset  More than a month ago    Pain Frequency  Intermittent    Aggravating Factors   lifting arm    Pain Relieving Factors  rest         [facility] PT Assessment - 09/06/18 2242      Assessment   Medical Diagnosis  Parkinson's disease    Referring Provider (PT)  Dr. Rubin Payor    Onset Date/Surgical Date  08/31/18   PT order; d/c from PT 03/07/18     Precautions   Precautions  Fall      Balance Screen   Has the patient fallen in the past 6 months  No    Has  the patient had a decrease in activity level because of a fear of falling?   No    Is the patient reluctant to leave their home because of a fear of falling?   No      Home Environment   Living Environment  Private residence    Living Arrangements  Spouse/significant other    Available Help at Discharge  Family    Type of Home  House    Home Access  Stairs to enter   one step to enter   Entrance Stairs-Number of Steps  1    Entrance Stairs-Rails  None    Home Layout  Able to live on main level with bedroom/bathroom;Two level    Home Equipment  Cane - single point      Prior Function   Level of Independence  Independent    Leisure  Does some exercises at home daily (PWR! Moves in supine and quadruped); does walking everyday      Observation/Other Assessments   Focus on Therapeutic Outcomes (FOTO)   NA      Posture/Postural Control   Posture/Postural Control  Postural limitations    Postural  Limitations  Forward head;Rounded Shoulders;Flexed trunk   Increased flexed trunk with gait   Posture Comments  Dyskinesias more prevalent with fatigue-occur in trunk and upper body      Strength   Overall Strength  Deficits    Overall Strength Comments  Grossly tested at least 4/5 bilateral lower extremities      Transfers   Transfers  Sit to Stand;Stand to Sit    Sit to Stand  6: Modified independent (Device/Increase time);Without upper extremity assist;From chair/3-in-1    Five time sit to stand comments   11.59    Stand to Sit  6: Modified independent (Device/Increase time);Without upper extremity assist;To chair/3-in-1      Ambulation/Gait   Ambulation/Gait  Yes    Ambulation/Gait Assistance  6: Modified independent (Device/Increase time)    Ambulation Distance (Feet)  200 Feet    Assistive device  None   Carries SPC   Gait Pattern  Step-through pattern;Decreased arm swing - right;Decreased arm swing - left;Decreased step length - left;Decreased step length - right;Trunk flexed   En bloc turning, decreased foot clearance with turns   Ambulation Surface  Level;Indoor    Gait velocity  8.85 sec = 3.71 ft/sec      High Level Balance   High Level Balance Comments  MiniBESTest score:  21/28 (See note for details)      Timed Up and Go Test   Normal TUG (seconds)  11.5    Cognitive TUG (seconds)  12.38    TUG Comments  Scores >13.5-15 seconds indicate increased fall risk.      High Level Balance   High Level Balance Comments           Mini-BESTest: Balance Evaluation Systems Test  2005-2013 [facility] [employer]. All rights reserved. ________________________________________________________________________________________Anticipatory_________Subscore___4__/6 1. SIT TO STAND Instruction: "Cross your arms across your chest. Try not to use your hands unless you must.Do not let your legs lean against the back of the chair when you stand. Please stand up now." X(2)  Normal: Comes to stand without use of hands and stabilizes independently. (1) Moderate: Comes to stand WITH use of hands on first attempt. (0) Severe: Unable to stand up from chair without assistance, OR needs several attempts with use of hands. 2. RISE TO TOES Instruction: "Place your feet shoulder width  apart. Place your hands on your hips. Try to rise as high as you can onto your toes. I will count out loud to 3 seconds. Try to hold this pose for at least 3 seconds. Look straight ahead. Rise now." (2) Normal: Stable for 3 s with maximum height. X(1) Moderate: Heels up, but not full range (smaller than when holding hands), OR noticeable instability for 3 s. (0) Severe: < 3 s. 3. STAND ON ONE LEG Instruction: "Look straight ahead. Keep your hands on your hips. Lift your leg off of the ground behind you without touching or resting your raised leg upon your other standing leg. Stay standing on one leg as long as you can. Look straight ahead. Lift now." Left: Time in Seconds Trial 1:__1.13___Trial 2:___2.53__ (2) Normal: 20 s. X(1) Moderate: < 20 s. (0) Severe: Unable. Right: Time in Seconds Trial 1:_1.19____Trial 2:__0.75___ (2) Normal: 20 s. X(1) Moderate: < 20 s. (0) Severe: Unable To score each side separately use the trial with the longest time. To calculate the sub-score and total score use the side [left or right] with the lowest numerical score [i.e. the worse side]. ______________________________________________________________________________________Reactive Postural Control___________Subscore:____4_/6 4. COMPENSATORY STEPPING CORRECTION- FORWARD Instruction: "Stand with your feet shoulder width apart, arms at your sides. Lean forward against my hands beyond your forward limits. When I let go, do whatever is necessary, including taking a step, to avoid a fall." X(2) Normal: Recovers independently with a single, large step (second realignment step is allowed). (1) Moderate: More  than one step used to recover equilibrium. (0) Severe: No step, OR would fall if not caught, OR falls spontaneously. 5. COMPENSATORY STEPPING CORRECTION- BACKWARD Instruction: "Stand with your feet shoulder width apart, arms at your sides. Lean backward against my hands beyond your backward limits. When I let go, do whatever is necessary, including taking a step, to avoid a fall." (2) Normal: Recovers independently with a single, large step. (1) Moderate: More than one step used to recover equilibrium. X(0) Severe: No step, OR would fall if not caught, OR falls spontaneously. 6. COMPENSATORY STEPPING CORRECTION- LATERAL Instruction: "Stand with your feet together, arms down at your sides. Lean into my hand beyond your sideways limit. When I let go, do whatever is necessary, including taking a step, to avoid a fall." Left X(2) Normal: Recovers independently with 1 step (crossover or lateral OK). (1) Moderate: Several steps to recover equilibrium. (0) Severe: Falls, or cannot step. Right X(2) Normal: Recovers independently with 1 step (crossover or lateral OK). (1) Moderate: Several steps to recover equilibrium. (0) Severe: Falls, or cannot step. Use the side with the lowest score to calculate sub-score and total score. ____________________________________________________________________________________Sensory Orientation_____________Subscore:_______6__/6 7. STANCE (FEET TOGETHER); EYES OPEN, FIRM SURFACE Instruction: "Place your hands on your hips. Place your feet together until almost touching. Look straight ahead. Be as stable and still as possible, until I say stop." Time in seconds:________ X(2) Normal: 30 s. (1) Moderate: < 30 s. (0) Severe: Unable. 8. STANCE (FEET TOGETHER); EYES CLOSED, FOAM SURFACE Instruction: "Step onto the foam. Place your hands on your hips. Place your feet together until almost touching. Be as stable and still as possible, until I say stop. I will start  timing when you close your eyes." Time in seconds:________ X(2) Normal: 30 s. (1) Moderate: < 30 s. (0) Severe: Unable. 9. INCLINE- EYES CLOSED Instruction: "Step onto the incline ramp. Please stand on the incline ramp with your toes toward the top. Place your feet shoulder width apart  and have your arms down at your sides. I will start timing when you close your eyes." Time in seconds:________ X(2) Normal: Stands independently 30 s and aligns with gravity. (1) Moderate: Stands independently <30 s OR aligns with surface. (0) Severe: Unable. _________________________________________________________________________________________Dynamic Gait ______Subscore______7__/10 10. CHANGE IN GAIT SPEED Instruction: "Begin walking at your normal speed, when I tell you 'fast', walk as fast as you can. When I say 'slow', walk very slowly." X(2) Normal: Significantly changes walking speed without imbalance. (1) Moderate: Unable to change walking speed or signs of imbalance. (0) Severe: Unable to achieve significant change in walking speed AND signs of imbalance. 11. WALK WITH HEAD TURNS - HORIZONTAL Instruction: "Begin walking at your normal speed, when I say "right", turn your head and look to the right. When I say "left" turn your head and look to the left. Try to keep yourself walking in a straight line." X(2) Normal: performs head turns with no change in gait speed and good balance. (1) Moderate: performs head turns with reduction in gait speed. (0) Severe: performs head turns with imbalance. 12. WALK WITH PIVOT TURNS Instruction: "Begin walking at your normal speed. When I tell you to 'turn and stop', turn as quickly as you can, face the opposite direction, and stop. After the turn, your feet should be close together." (2) Normal: Turns with feet close FAST (< 3 steps) with good balance. (1) Moderate: Turns with feet close SLOW (>4 steps) with good balance. X(0) Severe: Cannot turn with feet  close at any speed without imbalance. 13. STEP OVER OBSTACLES Instruction: "Begin walking at your normal speed. When you get to the box, step over it, not around it and keep walking." (2) Normal: Able to step over box with minimal change of gait speed and with good balance. X(1) Moderate: Steps over box but touches box OR displays cautious behavior by slowing gait. (0) Severe: Unable to step over box OR steps around box. 14. TIMED UP & GO WITH DUAL TASK [3 METER WALK] Instruction TUG: "When I say 'Go', stand up from chair, walk at your normal speed across the tape on the floor, turn around, and come back to sit in the chair." Instruction TUG with Dual Task: "Count backwards by threes starting at ___. When I say 'Go', stand up from chair, walk at your normal speed across the tape on the floor, turn around, and come back to sit in the chair. Continue counting backwards the entire time." TUG: ________seconds; Dual Task TUG: ________seconds (2) Normal: No noticeable change in sitting, standing or walking while backward counting when compared to TUG without Dual Task. X(1) Moderate: Dual Task affects either counting OR walking (>10%) when compared to the TUG without Dual Task. (0) Severe: Stops counting while walking OR stops walking while counting. When scoring item 14, if subject's gait speed slows more than 10% between the TUG without and with a Dual Task the score should be decreased by a point. TOTAL SCORE: _____21___/28          Objective measurements completed on examination: See above findings.                   PT Long Term Goals - 09/06/18 2238      PT LONG TERM GOAL #1   Title  Pt will perform HEP with wife's supervision for improved balance, posture, and mobility.  TARGET 10/06/18    Time  4    Period  Weeks    Status  New    Target Date  10/06/18      PT LONG TERM GOAL #2   Title  Pt will improve MiniBESTest to at least 2/28 for improved  balance/decreased fall risk.    Time  4    Period  Weeks    Status  New    Target Date  10/06/18      PT LONG TERM GOAL #3   Title  Pt/wife will verbalize understanding of tips to reduce freezing episodes with gait and turns.    Time  4    Period  Weeks    Status  New    Target Date  10/06/18             Plan - 09/06/18 2232    Clinical Impression Statement  Pt is an 84 year old gentleman with Parkinson's disease, who returns for follow-up evaluation from discharge from PT  6months ago.  He has had no falls, but wife is concerned about forward flexed posture and difficulty with turns.  He presents with fall risk per MiniBESTest, abnormal posture, decreased balance, decreased timing and coordination with gait and turns.  He would benefit from short course of therapy to address the above stated deficits to decrease fall risk and improve functional mobility.    History and Personal Factors relevant to plan of care:  PMH > 3 co-morbidities (s/p mitral valve replacement, dyslipidemia, cataract surgery, stroke, PD)    Clinical Presentation  Stable    Clinical Presentation due to:  fall risk per MIniBESTest    Clinical Decision Making  Low    Rehab Potential  Good    PT Frequency  2x / week    PT Duration  4 weeks   plus eval   PT Treatment/Interventions  ADLs/Self Care Home Management;DME Instruction;Balance training;Therapeutic exercise;Therapeutic activities;Functional mobility training;Gait training;Neuromuscular re-education;Patient/family education    PT Next Visit Plan  Initiate/revisit HEP-focus on posture and turning strategies    Consulted and Agree with Plan of Care  Patient    Family Member Consulted  wife, [name]       Patient will benefit from skilled therapeutic intervention in order to improve the following deficits and impairments:  Abnormal gait, Decreased balance, Decreased safety awareness, Decreased mobility, Decreased strength, Difficulty walking, Postural  dysfunction  Visit Diagnosis: Other abnormalities of gait and mobility  Abnormal posture  Unsteadiness on feet  Other symptoms and signs involving the nervous system     Problem List Patient Active Problem List   Diagnosis Date Noted  . Encounter for therapeutic drug monitoring 01/05/2016  . Paroxysmal atrial fibrillation (HCC) 12/24/2015  . 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 HLD (hyperlipidemia) 06/02/2015  . PD (Parkinson's disease) (HCC) 06/02/2015  . Mitral regurgitation 03/17/2015  . CVA (cerebral infarction)   . TIA (transient ischemic attack) 03/05/2015  . Slurred speech 03/05/2015  . Anxiety 12/19/2013  . Parkinson's disease (HCC) 08/06/2013  . Dyslipidemia 08/06/2013  . S/P mitral valve repair 08/06/2013    Lollie Gunner W. 09/06/2018, 10:43 PM Gean Maidens., PT   [facility] [address] [place], [place], [address] Phone: [phone]   Fax:  [phone]  Name: [name] [name] MRN: [id number] Date of Birth: October 26, 1932

## 2018-09-06 NOTE — Therapy (Signed)
[facility] Health Outpt Rehabilitation [facility] [address] [place], [place], [address] Phone: [phone]   Fax:  [phone]  Occupational Therapy Evaluation  Patient Details  Name: [name] [name] MRN: [id number] Date of Birth: 10-20-1932 Referring Provider (OT): Dr. Rubin Payor   Encounter Date: 09/05/2018  OT End of Session - 09/06/18 1610    Visit Number  1    Number of Visits  8    Date for OT Re-Evaluation  10/06/18    Authorization Type  UHC MCR    Authorization - Visit Number  1    Authorization - Number of Visits  10    OT Start Time  1020    OT Stop Time  1100    OT Time Calculation (min)  40 min    Behavior During Therapy  [facility] for tasks assessed/performed       Past Medical History:  Diagnosis Date  . Dyslipidemia   . Parkinson's disease (HCC)   . S/P mitral valve replacement    mitral regurg, endocarditis  . Stroke [facility])     Past Surgical History:  Procedure Laterality Date  . CATARACT EXTRACTION  2009, 2012   right 2009, left 2012  . LOOP RECORDER IMPLANT Left 03/13/2015   Procedure: LOOP RECORDER IMPLANT;  Surgeon: Marinus Maw, MD;  Location: [facility] CATH LAB;  Service: Cardiovascular;  Laterality: Left;  . 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 TEE WITHOUT CARDIOVERSION N/A 03/13/2015   Procedure: TRANSESOPHAGEAL ECHOCARDIOGRAM (TEE);  Surgeon: Laurey Morale, MD;  Location: [facility] ENDOSCOPY;  Service: Cardiovascular;  Laterality: N/A;  . TRANSTHORACIC ECHOCARDIOGRAM  08/15/2012   EF=>55%; normal LV systolic function; RV mildly dilated & systolic function mildly reduced; RA mod dilated; mild -mod MR & mildy increased gradients; mild TR; AV mildly sclerotic with mild-mod regurg    There were no vitals filed for this visit.  Subjective Assessment - 09/06/18 1609    Pertinent History  PD, hx of CVA mitral valve replacement, dyslipidemia, a-fib    Patient Stated Goals  maintain independence    Currently in Pain?  Yes    Pain Location  Shoulder    Pain Orientation  Left    Pain Descriptors / Indicators  Aching    Pain Type  Chronic pain    Pain Onset  More than a month ago    Pain Frequency  Intermittent    Aggravating Factors   lifting arm    Pain Relieving Factors  rest    Multiple Pain Sites  No        [facility] OT Assessment - 09/06/18 1639      Assessment   Medical Diagnosis  Parkinson's disease    Referring Provider (OT)  Dr. Rubin Payor    Onset Date/Surgical Date  08/31/18      Precautions   Precautions  Fall      Balance Screen   Has the patient fallen in the past 6 months  No      Home  Environment   Family/patient expects to be discharged to:  Private residence    Lives With  Spouse      Prior Function   Level of Independence  Independent    Vocation  Retired    Leisure  Does some exercises at home daily (PWR! Moves in supine and quadruped); does walking everyday      ADL  Eating/Feeding  Modified independent    Grooming  Independent    Upper Body Bathing  Modified independent    Lower Body Bathing  Modified independent    Upper Body Dressing  Minimal assistance   shirt at times   Lower Body Dressing  Minimal assistance   donning belt   Toilet Transfer  Independent    Tub/Shower Transfer  Modified independent      IADL   Medication Management  Has difficulty remembering to take medication      Mobility   Mobility Status  Independent;History of falls      Cognition   Overall Cognitive Status  Impaired/Different from baseline    Area of Impairment  Memory;Attention;Safety/judgement;Awareness;Problem solving    Current Attention Level  Selective    Attention  Selective    Memory  Impaired    Memory Impairment  Decreased short term memory      Observation/Other Assessments   Other Surveys   Select    Physical Performance Test    Yes    Simulated Eating Comments  17.53 secs    Donning Doffing  Jacket Time (seconds)  18.88 secs   Pt donned his own vest   Donning Doffing Jacket Comments  3 button/ unbutton: 2 buttons completed in 2.mins 58 secs      Coordination   Gross Motor Movements are Fluid and Coordinated  No    Fine Motor Movements are Fluid and Coordinated  No    9 Hole Peg Test  Right;Left    Right 9 Hole Peg Test  46.35 secs    Left 9 Hole Peg Test  32.82 sec    Box and Blocks  RUE 36, LUE 43 blocks      ROM / Strength   AROM / PROM / Strength  AROM      AROM   Overall AROM   Deficits    Overall AROM Comments  RUE shoulder flexion 130, elbow ext -10, LUE shoulder flexion with pain 120, , elbow extension -10, shoulder abduction 70 with pain present                           OT Long Term Goals - 09/06/18 1634      OT LONG TERM GOAL #1   Title  Pt will verbalize understanding of strategies/AE to assist with ADLs/IADLs prn.    Time  5    Period  Weeks    Status  New    Target Date  10/21/18      OT LONG TERM GOAL #2   Title  Pt will demonstrate ability to retrieve a lightweight object at 125 shoulder flexion with pain less than or equal to 3/10 with LUE.    Time  5    Period  Weeks    Status  New      OT LONG TERM GOAL #3   Title  Pt will be I with updated PD specific HEP following review    Time  5    Period  Weeks    Status  New      OT LONG TERM GOAL #4   Title  Pt will demonstrate improved RUE fine motor coordination as evidenced by decreasing 9 hole peg test score by 3 secs    Baseline  RUE 46.35, LUE 32.82 secs    Time  5    Period  Weeks    Status  New  OT LONG TERM GOAL #5   Title  ------------------------------------            Plan - 09/06/18 1622    Clinical Impression Statement  Pt is an 84 y.o male with Parkinson's disease who returns to occupational therapy with a decline in ADLS and cognition. Pt presents with the following deficits: decreased coordination, bradykinsesia, pain, decreased ROM, rigidity  cognitive deficits, decreased balance, which impedes perfromance of daily activities. Pt can benefit from skilled occupational therapy to address these deficits in order to maximize safety and independence with ADLs/ IADLS     Occupational Profile and client history currently impacting functional performance  Parkinson's disease, CVA, mitral valve replacement, dyslipedemia, a-fib, Pt's deficits impede his ability to perfom ADLS, IADLS,  leisure activities including travel and yardwork.     Occupational performance deficits (Please refer to evaluation for details):  ADL's;IADL's;Leisure    Rehab Potential  Good    Current Impairments/barriers affecting progress:  cognitive deficits, decreased awareness of deficits    OT Frequency  2x / week    OT Duration  --   5 weeks   OT Treatment/Interventions  Self-care/ADL training;Therapeutic exercise;Visual/perceptual remediation/compensation;Patient/family education;Neuromuscular education;Moist Heat;Therapeutic activities;Cognitive remediation/compensation;DME and/or AE instruction;Cryotherapy    Plan  update HEP, ADL strategies    Consulted and Agree with Plan of Care  Patient;Family member/caregiver    Family Member Consulted  wife       Patient will benefit from skilled therapeutic intervention in order to improve the following deficits and impairments:  Abnormal gait, Decreased cognition, Pain, Decreased mobility, Decreased coordination, Decreased activity tolerance, Decreased endurance, Decreased range of motion, Decreased strength, Impaired UE functional use, Impaired perceived functional ability, Difficulty walking, Decreased safety awareness, Decreased knowledge of precautions, Decreased balance  Visit Diagnosis: Other lack of coordination  Attention and concentration deficit  Other symptoms and signs involving the nervous system  Visuospatial deficit    Problem List Patient Active Problem List   Diagnosis Date Noted  . Encounter for  therapeutic drug monitoring 01/05/2016  . Paroxysmal atrial fibrillation (HCC) 12/24/2015  . 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 HLD (hyperlipidemia) 06/02/2015  . PD (Parkinson's disease) (HCC) 06/02/2015  . Mitral regurgitation 03/17/2015  . CVA (cerebral infarction)   . TIA (transient ischemic attack) 03/05/2015  . Slurred speech 03/05/2015  . Anxiety 12/19/2013  . Parkinson's disease (HCC) 08/06/2013  . Dyslipidemia 08/06/2013  . S/P mitral valve repair 08/06/2013    [name] [name] 09/06/2018, 4:50 PM  [facility] [address] [place], [place], [address] Phone: [phone]   Fax:  [phone]  Name: [name] [name] MRN: [id number] Date of Birth: [birth year]

## 2018-09-07 ENCOUNTER — Ambulatory Visit: Payer: Medicare Other | Admitting: Occupational Therapy

## 2018-09-07 DIAGNOSIS — R2689 Other abnormalities of gait and mobility: Secondary | ICD-10-CM | POA: Diagnosis not present

## 2018-09-07 DIAGNOSIS — R278 Other lack of coordination: Secondary | ICD-10-CM

## 2018-09-07 DIAGNOSIS — R41840 Attention and concentration deficit: Secondary | ICD-10-CM

## 2018-09-07 DIAGNOSIS — R29898 Other symptoms and signs involving the musculoskeletal system: Secondary | ICD-10-CM

## 2018-09-07 DIAGNOSIS — R29818 Other symptoms and signs involving the nervous system: Secondary | ICD-10-CM

## 2018-09-07 NOTE — Patient Instructions (Signed)
Perform the PWR! Exercises laying down daily  Coordination Exercises  Perform the following exercises for 20 minutes 1 times per day. Perform with both hand(s). Perform using big movements.   Flipping Cards: Place deck of cards on the table. Flip cards over by opening your hand big to grasp and then turn your palm up big.  Deal cards: Hold 1/2 or whole deck in your hand. Use thumb to push card off top of deck with one big push.  Pick up coins and stack one at a time: Pick up with big, intentional movements. Do not drag coin to the edge. (5-10 in a stack)  Pick up 5-10 coins one at a time and hold in palm. Then, move coins from palm to fingertips one at a time to stack.  Practice writing: Slow down, write big, and focus on forming each letter.  Perform "Flicks"/hand stretches (PWR! Hands): Close hands then flick out your fingers with focus on opening hands, pulling wrists back, and extending elbows like you are pushing.

## 2018-09-08 NOTE — Therapy (Signed)
[facility] Health Outpt Rehabilitation [facility] [address] [place], [place], [address] Phone: [phone]   Fax:  [phone]  Occupational Therapy Treatment  Patient Details  Name: [name] [name] MRN: [id number] Date of Birth: 08-Sep-1932 Referring Provider (OT): Dr. Rubin Payor   Encounter Date: 09/07/2018  OT End of Session - 09/08/18 0909    Visit Number  2    Number of Visits  8    Date for OT Re-Evaluation  10/06/18    Authorization Type  UHC MCR    Authorization - Visit Number  2    Authorization - Number of Visits  10    OT Start Time  0850    OT Stop Time  0930    OT Time Calculation (min)  40 min    Activity Tolerance  Patient limited by lethargy    Behavior During Therapy  [facility] for tasks assessed/performed       Past Medical History:  Diagnosis Date  . Dyslipidemia   . Parkinson's disease (HCC)   . S/P mitral valve replacement    mitral regurg, endocarditis  . Stroke [facility])     Past Surgical History:  Procedure Laterality Date  . CATARACT EXTRACTION  2009, 2012   right 2009, left 2012  . LOOP RECORDER IMPLANT Left 03/13/2015   Procedure: LOOP RECORDER IMPLANT;  Surgeon: Marinus Maw, MD;  Location: [facility] CATH LAB;  Service: Cardiovascular;  Laterality: Left;  . 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 TEE WITHOUT CARDIOVERSION N/A 03/13/2015   Procedure: TRANSESOPHAGEAL ECHOCARDIOGRAM (TEE);  Surgeon: Laurey Morale, MD;  Location[facility] ENDOSCOPY;  Service: Cardiovascular;  Laterality: N/A;  . TRANSTHORACIC ECHOCARDIOGRAM  08/15/2012   EF=>55%; normal LV systolic function; RV mildly dilated & systolic function mildly reduced; RA mod dilated; mild -mod MR & mildy increased gradients; mild TR; AV mildly sclerotic with mild-mod regurg    There were no vitals filed for this visit.  Subjective Assessment - 09/08/18 0908    Pertinent History  PD, hx of CVA  mitral valve replacement, dyslipidemia, a-fib    Patient Stated Goals  maintain independence    Currently in Pain?  Yes    Pain Score  3     Pain Location  Shoulder    Pain Orientation  Left    Pain Descriptors / Indicators  Aching    Pain Type  Chronic pain    Pain Onset  More than a month ago    Pain Frequency  Intermittent    Aggravating Factors   lifting arm    Pain Relieving Factors  rest    Multiple Pain Sites  No                    Treatment: Therapsit updated HEP, see pt instructions.        OT Education - 09/08/18 0913    Education Details  coordination HEP -see pt instructions, PWR1 supine- PWR! up, rock and twist, (twist modified with arms lower due to shoulder pain LUE) 10-20 reps each    Person(s) Educated  Patient;Spouse    Methods  Explanation;Demonstration;Verbal cues;Handout    Comprehension  Verbalized understanding;Returned demonstration;Verbal cues required          OT Long Term Goals - 09/06/18 1634      OT LONG TERM GOAL #1   Title  Pt will verbalize understanding of strategies/AE  to assist with ADLs/IADLs prn.    Time  5    Period  Weeks    Status  New    Target Date  10/21/18      OT LONG TERM GOAL #2   Title  Pt will demonstrate ability to retrieve a lightweight object at 125 shoulder flexion with pain less than or equal to 3/10 with LUE.    Time  5    Period  Weeks    Status  New      OT LONG TERM GOAL #3   Title  Pt will be I with updated PD specific HEP following review    Time  5    Period  Weeks    Status  New      OT LONG TERM GOAL #4   Title  Pt will demonstrate improved RUE fine motor coordination as evidenced by decreasing 9 hole peg test score by 3 secs    Baseline  RUE 46.35, LUE 32.82 secs    Time  5    Period  Weeks    Status  New      OT LONG TERM GOAL #5   Title  ------------------------------------            Plan - 09/08/18 0910    Clinical Impression Statement  Pt is progressing towards  goals. Pt/ wife verbalize understanding of HEP following review.    Occupational Profile and client history currently impacting functional performance  Parkinson's disease, CVA, mitral valve replacement, dyslipedemia, a-fib, Pt's deficits impede his ability to perfom ADLS, IADLS,  leisure activities including travel and yardwork.     Occupational performance deficits (Please refer to evaluation for details):  ADL's;IADL's;Leisure    Current Impairments/barriers affecting progress:  cognitive deficits, decreased awareness of deficits    OT Frequency  2x / week    OT Duration  --   5 weeks   OT Treatment/Interventions  Self-care/ADL training;Therapeutic exercise;Visual/perceptual remediation/compensation;Patient/family education;Neuromuscular education;Moist Heat;Therapeutic activities;Cognitive remediation/compensation;DME and/or AE instruction;Cryotherapy    Plan  continue to update HEP, ADL strategies    OT Home Exercise Plan  supine PWR!, beginning coordination    Consulted and Agree with Plan of Care  Patient;Family member/caregiver    Family Member Consulted  wife       Patient will benefit from skilled therapeutic intervention in order to improve the following deficits and impairments:  Abnormal gait, Decreased cognition, Pain, Decreased mobility, Decreased coordination, Decreased activity tolerance, Decreased endurance, Decreased range of motion, Decreased strength, Impaired UE functional use, Impaired perceived functional ability, Difficulty walking, Decreased safety awareness, Decreased knowledge of precautions, Decreased balance  Visit Diagnosis: Other lack of coordination  Other symptoms and signs involving the musculoskeletal system  Other symptoms and signs involving the nervous system  Attention and concentration deficit    Problem List Patient Active Problem List   Diagnosis Date Noted  . Encounter for therapeutic drug monitoring 01/05/2016  . Paroxysmal atrial  fibrillation (HCC) 12/24/2015  . 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 HLD (hyperlipidemia) 06/02/2015  . PD (Parkinson's disease) (HCC) 06/02/2015  . Mitral regurgitation 03/17/2015  . CVA (cerebral infarction)   . TIA (transient ischemic attack) 03/05/2015  . Slurred speech 03/05/2015  . Anxiety 12/19/2013  . Parkinson's disease (HCC) 08/06/2013  . Dyslipidemia 08/06/2013  . S/P mitral valve repair 08/06/2013    RINE,KATHRYN 09/08/2018, 9:16 AM   Outpt Rehabilitation [facility] [address] [place],  [place], [address] Phone: [phone]   Fax:  [phone]  Name: Vannak Montenegro MRN: [id number] Date of Birth: Aug 28, 1932

## 2018-09-11 ENCOUNTER — Encounter: Payer: Self-pay | Admitting: Internal Medicine

## 2018-09-12 ENCOUNTER — Ambulatory Visit: Payer: Medicare Other | Admitting: Occupational Therapy

## 2018-09-12 DIAGNOSIS — R2689 Other abnormalities of gait and mobility: Secondary | ICD-10-CM | POA: Diagnosis not present

## 2018-09-12 DIAGNOSIS — R29898 Other symptoms and signs involving the musculoskeletal system: Secondary | ICD-10-CM

## 2018-09-12 DIAGNOSIS — R29818 Other symptoms and signs involving the nervous system: Secondary | ICD-10-CM

## 2018-09-12 DIAGNOSIS — R278 Other lack of coordination: Secondary | ICD-10-CM

## 2018-09-12 DIAGNOSIS — R41840 Attention and concentration deficit: Secondary | ICD-10-CM

## 2018-09-12 NOTE — Therapy (Signed)
[facility] Health Outpt Rehabilitation [facility] [address] [place], [place], [address] Phone: [phone]   Fax:  [phone]  Occupational Therapy Treatment  Patient Details  Name: [name] [name] MRN: [id number] Date of Birth: 05-02-1932 Referring Provider (OT): Dr. Rubin Payor   Encounter Date: 09/12/2018  OT End of Session - 09/12/18 1629    Visit Number  3    Number of Visits  8    Date for OT Re-Evaluation  10/06/18    Authorization Type  UHC MCR    Authorization - Visit Number  3    Authorization - Number of Visits  10    OT Start Time  1405    OT Stop Time  1445    OT Time Calculation (min)  40 min    Activity Tolerance  Patient limited by lethargy    Behavior During Therapy  [facility] for tasks assessed/performed       Past Medical History:  Diagnosis Date  . Dyslipidemia   . Parkinson's disease (HCC)   . S/P mitral valve replacement    mitral regurg, endocarditis  . Stroke [facility])     Past Surgical History:  Procedure Laterality Date  . CATARACT EXTRACTION  2009, 2012   right 2009, left 2012  . LOOP RECORDER IMPLANT Left 03/13/2015   Procedure: LOOP RECORDER IMPLANT;  Surgeon: Marinus Maw, MD;  Location: [facility] CATH LAB;  Service: Cardiovascular;  Laterality: Left;  . 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 TEE WITHOUT CARDIOVERSION N/A 03/13/2015   Procedure: TRANSESOPHAGEAL ECHOCARDIOGRAM (TEE);  Surgeon: Laurey Morale, MD;  Location: [facility] ENDOSCOPY;  Service: Cardiovascular;  Laterality: N/A;  . TRANSTHORACIC ECHOCARDIOGRAM  08/15/2012   EF=>55%; normal LV systolic function; RV mildly dilated & systolic function mildly reduced; RA mod dilated; mild -mod MR & mildy increased gradients; mild TR; AV mildly sclerotic with mild-mod regurg    There were no vitals filed for this visit.  Subjective Assessment - 09/12/18 1628    Pertinent History  PD, hx of CVA  mitral valve replacement, dyslipidemia, a-fib    Patient Stated Goals  maintain independence    Currently in Pain?  Yes    Pain Score  3     Pain Location  Shoulder    Pain Orientation  Left    Pain Descriptors / Indicators  Aching    Pain Type  Chronic pain    Pain Onset  More than a month ago    Pain Frequency  Intermittent    Aggravating Factors   shoulder flexion    Pain Relieving Factors  rest    Multiple Pain Sites  Yes                   Treatment:Supine closed chain shoulder flexion, diagonals,with medium ball, 10 reps each, min v.c Pt performed PWR! Twist in quadraped, he experienced pain so exercise was discontinued,  PWR! Rock in modified quadraped standing at table, mod v.c/ facilitation for proper postioning Flipping playing cards with big movements/ effort, min v.c followed by flicking cards to promote finger extension. Pt / wife were instructed that pt should perform modified quadraped rock at table and that if he flicks cards at home, he should perform so that he does not have to pick up from floor to minimize pain.             OT Long  Term Goals - 09/06/18 1634      OT LONG TERM GOAL #1   Title  Pt will verbalize understanding of strategies/AE to assist with ADLs/IADLs prn.    Time  5    Period  Weeks    Status  New    Target Date  10/21/18      OT LONG TERM GOAL #2   Title  Pt will demonstrate ability to retrieve a lightweight object at 125 shoulder flexion with pain less than or equal to 3/10 with LUE.    Time  5    Period  Weeks    Status  New      OT LONG TERM GOAL #3   Title  Pt will be I with updated PD specific HEP following review    Time  5    Period  Weeks    Status  New      OT LONG TERM GOAL #4   Title  Pt will demonstrate improved RUE fine motor coordination as evidenced by decreasing 9 hole peg test score by 3 secs    Baseline  RUE 46.35, LUE 32.82 secs    Time  5    Period  Weeks    Status  New      OT LONG TERM  GOAL #5   Title  ------------------------------------            Plan - 09/12/18 1632    Clinical Impression Statement  Pt is progressing towards goals. He requires verbal cues to slow down in order to minimize fall risk.    Occupational Profile and client history currently impacting functional performance  Parkinson's disease, CVA, mitral valve replacement, dyslipedemia, a-fib, Pt's deficits impede his ability to perfom ADLS, IADLS,  leisure activities including travel and yardwork.     Occupational performance deficits (Please refer to evaluation for details):  ADL's;IADL's;Leisure    Rehab Potential  Good    Current Impairments/barriers affecting progress:  cognitive deficits, decreased awareness of deficits    OT Frequency  2x / week    OT Duration  --   5   OT Treatment/Interventions  Self-care/ADL training;Therapeutic exercise;Visual/perceptual remediation/compensation;Patient/family education;Neuromuscular education;Moist Heat;Therapeutic activities;Cognitive remediation/compensation;DME and/or AE instruction;Cryotherapy    Plan  continue to update HEP, ADL strategies    Consulted and Agree with Plan of Care  Patient;Family member/caregiver    Family Member Consulted  wife       Patient will benefit from skilled therapeutic intervention in order to improve the following deficits and impairments:  Abnormal gait, Decreased cognition, Pain, Decreased mobility, Decreased coordination, Decreased activity tolerance, Decreased endurance, Decreased range of motion, Decreased strength, Impaired UE functional use, Impaired perceived functional ability, Difficulty walking, Decreased safety awareness, Decreased knowledge of precautions, Decreased balance  Visit Diagnosis: Other lack of coordination  Other symptoms and signs involving the musculoskeletal system  Other symptoms and signs involving the nervous system  Attention and concentration deficit    Problem List Patient Active  Problem List   Diagnosis Date Noted  . Encounter for therapeutic drug monitoring 01/05/2016  . Paroxysmal atrial fibrillation (HCC) 12/24/2015  . 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 HLD (hyperlipidemia) 06/02/2015  . PD (Parkinson's disease) (HCC) 06/02/2015  . Mitral regurgitation 03/17/2015  . CVA (cerebral infarction)   . TIA (transient ischemic attack) 03/05/2015  . Slurred speech 03/05/2015  . Anxiety 12/19/2013  . Parkinson's disease (HCC) 08/06/2013  . Dyslipidemia 08/06/2013  . S/P  mitral valve repair 08/06/2013    RINE,KATHRYN 09/12/2018, 4:35 PM Keene Breath, OTR/L [phone] Phone: [phone] 4:54 PM 09/12/18 [facility] Health Outpt Rehabilitation [facility] [address] [place], [place], [address] Phone: [phone]   Fax:  [phone]  Name: Leonides Minder MRN: [id number] Date of Birth: 04-Sep-1932

## 2018-09-13 ENCOUNTER — Ambulatory Visit: Payer: Medicare Other | Admitting: Occupational Therapy

## 2018-09-13 DIAGNOSIS — M79602 Pain in left arm: Secondary | ICD-10-CM

## 2018-09-13 DIAGNOSIS — R2689 Other abnormalities of gait and mobility: Secondary | ICD-10-CM | POA: Diagnosis not present

## 2018-09-13 DIAGNOSIS — R41842 Visuospatial deficit: Secondary | ICD-10-CM

## 2018-09-13 DIAGNOSIS — R29898 Other symptoms and signs involving the musculoskeletal system: Secondary | ICD-10-CM

## 2018-09-13 DIAGNOSIS — R293 Abnormal posture: Secondary | ICD-10-CM

## 2018-09-13 DIAGNOSIS — R41840 Attention and concentration deficit: Secondary | ICD-10-CM

## 2018-09-13 DIAGNOSIS — R278 Other lack of coordination: Secondary | ICD-10-CM

## 2018-09-13 DIAGNOSIS — R29818 Other symptoms and signs involving the nervous system: Secondary | ICD-10-CM

## 2018-09-13 NOTE — Patient Instructions (Signed)
Cane Overhead - Supine  Hold cane at thighs with both hands, extend arms to 90* only. Hold 5 seconds. Repeat 10-15 times. Do 1 times per day.  Laying on your back, press arms into bed and squeeze shoulder blades back, 10 reps  PWR! Rock twist upper body to one side, reach over opposite shoulder as if reaching for head board, keep left arm close to your body until you roll over onto right side, then stretch arm up, bring arm back close to body to return to your back. 10 reps, stop if you have increased pain

## 2018-09-13 NOTE — Therapy (Signed)
[facility] [address] [place], [place], [address] Phone: [phone]   Fax:  [phone]  Occupational Therapy Treatment  Patient Details  Name: [name] [name] MRN: [id number] Date of Birth: 07-May-1932 Referring Provider (OT): Dr. Rubin Payor   Encounter Date: 09/13/2018  OT End of Session - 09/13/18 1213    Visit Number  4    Number of Visits  8    Date for OT Re-Evaluation  10/06/18    Authorization Type  UHC MCR    Authorization - Visit Number  4    Authorization - Number of Visits  10    OT Start Time  0848    OT Stop Time  0930    OT Time Calculation (min)  42 min    Activity Tolerance  Patient tolerated treatment well    Behavior During Therapy  [facility] for tasks assessed/performed       Past Medical History:  Diagnosis Date  . Dyslipidemia   . Parkinson's disease (HCC)   . S/P mitral valve replacement    mitral regurg, endocarditis  . Stroke [facility])     Past Surgical History:  Procedure Laterality Date  . CATARACT EXTRACTION  2009, 2012   right 2009, left 2012  . LOOP RECORDER IMPLANT Left 03/13/2015   Procedure: LOOP RECORDER IMPLANT;  Surgeon: Marinus Maw, MD;  Location: [facility] CATH LAB;  Service: Cardiovascular;  Laterality: Left;  . 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 TEE WITHOUT CARDIOVERSION N/A 03/13/2015   Procedure: TRANSESOPHAGEAL ECHOCARDIOGRAM (TEE);  Surgeon: Laurey Morale, MD;  Location: [facility] ENDOSCOPY;  Service: Cardiovascular;  Laterality: N/A;  . TRANSTHORACIC ECHOCARDIOGRAM  08/15/2012   EF=>55%; normal LV systolic function; RV mildly dilated & systolic function mildly reduced; RA mod dilated; mild -mod MR & mildy increased gradients; mild TR; AV mildly sclerotic with mild-mod regurg    There were no vitals filed for this visit.  Subjective Assessment - 09/13/18 1211    Subjective   reports arm pain  LUE     Pertinent History  PD, hx of CVA mitral valve replacement, dyslipidemia, a-fib    Patient Stated Goals  maintain independence    Currently in Pain?  Yes    Pain Score  4     Pain Location  Arm    Pain Orientation  Left    Pain Descriptors / Indicators  Aching    Pain Type  Chronic pain    Pain Onset  More than a month ago    Pain Frequency  Intermittent    Aggravating Factors   shoulder flexion/ abduction    Pain Relieving Factors  rest              Treatment: [place] 1 mhz, 0.8 w/cm 2, 20% x 8 mins to left upper arm arm and shoulder for pain relief, no adverse reactions(Pt has bruising in 2 places on arm from recent fall) Pt donned shirt (over left arm first then right arm then over head) with mod v.c and min A, Pt/ wife report this way is much better and easier for pt. Supine updates to HEP, see pt instructions mod v.c for speed and positioning to minimize pain. Reviewed proper techniques for getting in and out of bed (not crawling in quadraped.) Arm bike x 5 mins level 1  Pt maintined 40-50 rpm, v.c not to go too  fast            OT Education - 09/13/18 1218    Education Details   PWR! supine- PWR! up, rock with left arm close to his side while rolling to right, shoulder flexion with cane in supine, with cues to keep shoulder blade against bed. reps each    Person(s) Educated  Patient;Spouse    Methods  Explanation;Demonstration;Verbal cues;Handout    Comprehension  Verbalized understanding;Returned demonstration;Verbal cues required;Need further instruction          OT Long Term Goals - 09/06/18 1634      OT LONG TERM GOAL #1   Title  Pt will verbalize understanding of strategies/AE to assist with ADLs/IADLs prn.    Time  5    Period  Weeks    Status  New    Target Date  10/21/18      OT LONG TERM GOAL #2   Title  Pt will demonstrate ability to retrieve a lightweight object at 125 shoulder flexion with pain less than or equal to 3/10 with LUE.    Time   5    Period  Weeks    Status  New      OT LONG TERM GOAL #3   Title  Pt will be I with updated PD specific HEP following review    Time  5    Period  Weeks    Status  New      OT LONG TERM GOAL #4   Title  Pt will demonstrate improved RUE fine motor coordination as evidenced by decreasing 9 hole peg test score by 3 secs    Baseline  RUE 46.35, LUE 32.82 secs    Time  5    Period  Weeks    Status  New      OT LONG TERM GOAL #5   Title  ------------------------------------            Plan - 09/13/18 1214    Clinical Impression Statement  Pt is progressing towards goals. He demonstrates LUE arm/ shoulder pain that limits functional use. Therapist instructed pt to discontinue his previous HEP's and to only focus on several updated exercises for arm.    Occupational Profile and client history currently impacting functional performance  Parkinson's disease, CVA, mitral valve replacement, dyslipedemia, a-fib, Pt's deficits impede his ability to perfom ADLS, IADLS,  leisure activities including travel and yardwork.     Occupational performance deficits (Please refer to evaluation for details):  ADL's;IADL's;Leisure    Rehab Potential  Good    Current Impairments/barriers affecting progress:  cognitive deficits, decreased awareness of deficits    OT Frequency  2x / week    OT Duration  --   5 weeks   OT Treatment/Interventions  Self-care/ADL training;Therapeutic exercise;Visual/perceptual remediation/compensation;Patient/family education;Neuromuscular education;Moist Heat;Therapeutic activities;Cognitive remediation/compensation;DME and/or AE instruction;Cryotherapy    Plan  address LUE shoulder pain, ADL strategies    OT Home Exercise Plan  supine PWR! up and rock(with arm close to side until pt transitions to right side), cane shoulder flex in supin, basic coordination    Consulted and Agree with Plan of Care  Patient;Family member/caregiver    Family Member Consulted  wife        Patient will benefit from skilled therapeutic intervention in order to improve the following deficits and impairments:  Abnormal gait, Decreased cognition, Pain, Decreased mobility, Decreased coordination, Decreased activity tolerance, Decreased endurance, Decreased range of motion, Decreased strength, Impaired UE functional use, Impaired  perceived functional ability, Difficulty walking, Decreased safety awareness, Decreased knowledge of precautions, Decreased balance  Visit Diagnosis: Other symptoms and signs involving the musculoskeletal system  Other symptoms and signs involving the nervous system  Attention and concentration deficit  Other lack of coordination  Pain in left arm    Problem List Patient Active Problem List   Diagnosis Date Noted  . Encounter for therapeutic drug monitoring 01/05/2016  . Paroxysmal atrial fibrillation (HCC) 12/24/2015  . 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 HLD (hyperlipidemia) 06/02/2015  . PD (Parkinson's disease) (HCC) 06/02/2015  . Mitral regurgitation 03/17/2015  . CVA (cerebral infarction)   . TIA (transient ischemic attack) 03/05/2015  . Slurred speech 03/05/2015  . Anxiety 12/19/2013  . Parkinson's disease (HCC) 08/06/2013  . Dyslipidemia 08/06/2013  . S/P mitral valve repair 08/06/2013    RINE,KATHRYN 09/13/2018, 12:21 PM  [facility] [address] [place], [place], [address] Phone: [phone]   Fax:  [phone]  Name: [name] [name] MRN: [id number] Date of Birth: 08/02/1932

## 2018-09-19 ENCOUNTER — Encounter: Payer: Self-pay | Admitting: Occupational Therapy

## 2018-09-19 ENCOUNTER — Ambulatory Visit: Payer: Medicare Other | Admitting: Occupational Therapy

## 2018-09-19 DIAGNOSIS — R41840 Attention and concentration deficit: Secondary | ICD-10-CM

## 2018-09-19 DIAGNOSIS — R2681 Unsteadiness on feet: Secondary | ICD-10-CM

## 2018-09-19 DIAGNOSIS — R293 Abnormal posture: Secondary | ICD-10-CM

## 2018-09-19 DIAGNOSIS — M79602 Pain in left arm: Secondary | ICD-10-CM

## 2018-09-19 DIAGNOSIS — R2689 Other abnormalities of gait and mobility: Secondary | ICD-10-CM | POA: Diagnosis not present

## 2018-09-19 DIAGNOSIS — R29818 Other symptoms and signs involving the nervous system: Secondary | ICD-10-CM

## 2018-09-19 DIAGNOSIS — R29898 Other symptoms and signs involving the musculoskeletal system: Secondary | ICD-10-CM

## 2018-09-19 DIAGNOSIS — R278 Other lack of coordination: Secondary | ICD-10-CM

## 2018-09-19 NOTE — Patient Instructions (Addendum)
Ways to prevent future Parkinson' s related complications:  1.   Exercise regularly.  Perform your therapy exercises and incorporate safe aerobic exercise when possible (swimming, stationary bike, arm bike, seated stepper) 2.   Focus on BIGGER movements during daily activities- really reach overhead, straighten elbows and extend fingers 3.   When dressing or reaching for your seatbelt make sure to use your body to assist by twisting and looking at where you are reaching while you reach--this can help to minimize stress on the shoulder and reduce the risk of a rotator cuff tear 4.   Swing your arms when you walk!  People with PD are at increased risk for frozen shoulder and swinging your arms can reduce this risk. 5.  Keep you feet apart when you are standing to allow you to have better balance and reach further.  Make sure your feet are apart when you are sitting before you stand up. 6.  When you reach for something, make sure your thumb is facing up. 7.  Anytime you reach for move shoulder, make sure you have good upright posture

## 2018-09-19 NOTE — Therapy (Signed)
[facility] [address] [place], [place], [address] Phone: [phone]   Fax:  [phone]  Occupational Therapy Treatment  Patient Details  Name: [name] [name] MRN: [id number] Date of Birth: 07-Nov-1932 Referring Provider (OT): Dr. Rubin Payor   Encounter Date: 09/19/2018  OT End of Session - 09/19/18 2131    Visit Number  5    Number of Visits  8    Date for OT Re-Evaluation  10/06/18    Authorization Type  UHC MCR    Authorization - Visit Number  5    Authorization - Number of Visits  10    OT Start Time  [id number]    OT Stop Time  0920    OT Time Calculation (min)  42 min    Activity Tolerance  Patient tolerated treatment well    Behavior During Therapy  [facility] for tasks assessed/performed       Past Medical History:  Diagnosis Date  . Dyslipidemia   . Parkinson's disease (HCC)   . S/P mitral valve replacement    mitral regurg, endocarditis  . Stroke [facility])     Past Surgical History:  Procedure Laterality Date  . CATARACT EXTRACTION  2009, 2012   right 2009, left 2012  . LOOP RECORDER IMPLANT Left 03/13/2015   Procedure: LOOP RECORDER IMPLANT;  Surgeon: Marinus Maw, MD;  Location: [facility] CATH LAB;  Service: Cardiovascular;  Laterality: Left;  . 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 TEE WITHOUT CARDIOVERSION N/A 03/13/2015   Procedure: TRANSESOPHAGEAL ECHOCARDIOGRAM (TEE);  Surgeon: Laurey Morale, MD;  Location: [facility] ENDOSCOPY;  Service: Cardiovascular;  Laterality: N/A;  . TRANSTHORACIC ECHOCARDIOGRAM  08/15/2012   EF=>55%; normal LV systolic function; RV mildly dilated & systolic function mildly reduced; RA mod dilated; mild -mod MR & mildy increased gradients; mild TR; AV mildly sclerotic with mild-mod regurg    There were no vitals filed for this visit.  Subjective Assessment - 09/19/18 0839    Subjective   Pt reports LUE  pain is improved.  None at rest or with proper positioning    Pertinent History  PD, hx of CVA mitral valve replacement, dyslipidemia, a-fib    Patient Stated Goals  maintain independence    Currently in Pain?  No/denies    Pain Onset  More than a month ago       Ultrasound 1 mhz, 1 w/cm 2, 50% x 8 mins to left upper arm arm and shoulder for pain relief, no adverse reactions (Pt still has bruising in 2 places on arm from recent fall)  Educated pt/wife in magnet closure clothing (button up shirts, polos, pants) and website to purchase online due to reports of difficulty with buttons.  Reviewed supine HEP updates from last session with adaptions (shoulder flex with cane, supine PWR! Up and rock)    OT Education - 09/19/18 2125    Education Details  Ways to prevent future complications (reviewed and issued handout)--including proper positioning of shoulder with movement and how posture and associated trunk movements can help decr shoulder pain    Person(s) Educated  Patient;Spouse    Methods  Explanation;Demonstration;Verbal cues;Handout    Comprehension  Verbalized understanding;Returned demonstration;Verbal cues required;Need further instruction          OT Long Term Goals - 09/06/18 1634      OT LONG TERM GOAL #1   Title  Pt will verbalize understanding of strategies/AE to assist with ADLs/IADLs prn.    Time  5    Period  Weeks    Status  New    Target Date  10/21/18      OT LONG TERM GOAL #2   Title  Pt will demonstrate ability to retrieve a lightweight object at 125 shoulder flexion with pain less than or equal to 3/10 with LUE.    Time  5    Period  Weeks    Status  New      OT LONG TERM GOAL #3   Title  Pt will be I with updated PD specific HEP following review    Time  5    Period  Weeks    Status  New      OT LONG TERM GOAL #4   Title  Pt will demonstrate improved RUE fine motor coordination as evidenced by decreasing 9 hole peg test score by 3 secs    Baseline   RUE 46.35, LUE 32.82 secs    Time  5    Period  Weeks    Status  New      OT LONG TERM GOAL #5   Title  ------------------------------------            Plan - 09/19/18 2132    Clinical Impression Statement  Pt reports that LUE/shoulder pain is improved.  Pt reports that he is doing his HEP and wife reports that positioning thumb up really helps.     Occupational Profile and client history currently impacting functional performance  Parkinson's disease, CVA, mitral valve replacement, dyslipedemia, a-fib, Pt's deficits impede his ability to perfom ADLS, IADLS,  leisure activities including travel and yardwork.     Occupational performance deficits (Please refer to evaluation for details):  ADL's;IADL's;Leisure    Rehab Potential  Good    Current Impairments/barriers affecting progress:  cognitive deficits, decreased awareness of deficits    OT Frequency  2x / week    OT Duration  --   5 weeks   OT Treatment/Interventions  Self-care/ADL training;Therapeutic exercise;Visual/perceptual remediation/compensation;Patient/family education;Neuromuscular education;Moist Heat;Therapeutic activities;Cognitive remediation/compensation;DME and/or AE instruction;Cryotherapy    Plan  address LUE shoulder pain, continue with ADL strategies    OT Home Exercise Plan  supine PWR! up and rock(with arm close to side until pt transitions to right side), cane shoulder flex in supin, basic coordination    Consulted and Agree with Plan of Care  Patient;Family member/caregiver    Family Member Consulted  wife       Patient will benefit from skilled therapeutic intervention in order to improve the following deficits and impairments:  Abnormal gait, Decreased cognition, Pain, Decreased mobility, Decreased coordination, Decreased activity tolerance, Decreased endurance, Decreased range of motion, Decreased strength, Impaired UE functional use, Impaired perceived functional ability, Difficulty walking, Decreased  safety awareness, Decreased knowledge of precautions, Decreased balance  Visit Diagnosis: Other symptoms and signs involving the musculoskeletal system  Other symptoms and signs involving the nervous system  Attention and concentration deficit  Other lack of coordination  Pain in left arm  Abnormal posture  Unsteadiness on feet  Other abnormalities of gait and mobility    Problem List Patient Active Problem List   Diagnosis Date Noted  . Encounter for therapeutic drug monitoring 01/05/2016  . Paroxysmal atrial fibrillation (HCC) 12/24/2015  . 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 HLD (hyperlipidemia) 06/02/2015  . PD (Parkinson's disease) (HCC)  06/02/2015  . Mitral regurgitation 03/17/2015  . CVA (cerebral infarction)   . TIA (transient ischemic attack) 03/05/2015  . Slurred speech 03/05/2015  . Anxiety 12/19/2013  . Parkinson's disease (HCC) 08/06/2013  . Dyslipidemia 08/06/2013  . S/P mitral valve repair 08/06/2013    [facility] 09/19/2018, 9:33 PM  [facility] [address] [place], [place], [address] Phone: [phone]   Fax:  [phone]  Name: [name] [name] MRN: [id number] Date of Birth: [birth year]   Willa Frater, OTR/L [facility] [address] [place], [place]  [address] [phone] phone [phone] 09/19/18 9:33 PM

## 2018-09-21 ENCOUNTER — Encounter: Payer: Self-pay | Admitting: Occupational Therapy

## 2018-09-21 ENCOUNTER — Ambulatory Visit: Payer: Medicare Other | Admitting: Occupational Therapy

## 2018-09-21 DIAGNOSIS — R29898 Other symptoms and signs involving the musculoskeletal system: Secondary | ICD-10-CM

## 2018-09-21 DIAGNOSIS — R293 Abnormal posture: Secondary | ICD-10-CM

## 2018-09-21 DIAGNOSIS — R2681 Unsteadiness on feet: Secondary | ICD-10-CM

## 2018-09-21 DIAGNOSIS — R278 Other lack of coordination: Secondary | ICD-10-CM

## 2018-09-21 DIAGNOSIS — M79602 Pain in left arm: Secondary | ICD-10-CM

## 2018-09-21 DIAGNOSIS — R41840 Attention and concentration deficit: Secondary | ICD-10-CM

## 2018-09-21 DIAGNOSIS — R2689 Other abnormalities of gait and mobility: Secondary | ICD-10-CM | POA: Diagnosis not present

## 2018-09-21 DIAGNOSIS — R29818 Other symptoms and signs involving the nervous system: Secondary | ICD-10-CM

## 2018-09-21 NOTE — Patient Instructions (Signed)
  ROM: Abduction - Wand   Holding wand with left hand palm up, push wand directly out to side, leading with other hand palm down, until stretch is felt. Hold 5 seconds. Repeat 10 times per set. Do 1-2 sessions per day. (Lying down)

## 2018-09-21 NOTE — Therapy (Signed)
[facility] [address] [place], [place], [address] Phone: [phone]   Fax:  [phone]  Occupational Therapy Treatment  Patient Details  Name: [name] [name] MRN: [id number] Date of Birth: January 23, 1932 Referring Provider (OT): Dr. Rubin Payor   Encounter Date: 09/21/2018  OT End of Session - 09/21/18 1257    Visit Number  6    Number of Visits  8    Date for OT Re-Evaluation  10/06/18    Authorization Type  UHC MCR    Authorization - Visit Number  6    Authorization - Number of Visits  10    OT Start Time  [phone]    OT Stop Time  0917    OT Time Calculation (min)  43 min    Activity Tolerance  Patient tolerated treatment well    Behavior During Therapy  [facility] for tasks assessed/performed       Past Medical History:  Diagnosis Date  . Dyslipidemia   . Parkinson's disease (HCC)   . S/P mitral valve replacement    mitral regurg, endocarditis  . Stroke [facility])     Past Surgical History:  Procedure Laterality Date  . CATARACT EXTRACTION  2009, 2012   right 2009, left 2012  . LOOP RECORDER IMPLANT Left 03/13/2015   Procedure: LOOP RECORDER IMPLANT;  Surgeon: Marinus Maw, MD;  Location: [facility] CATH LAB;  Service: Cardiovascular;  Laterality: Left;  . 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 TEE WITHOUT CARDIOVERSION N/A 03/13/2015   Procedure: TRANSESOPHAGEAL ECHOCARDIOGRAM (TEE);  Surgeon: Laurey Morale, MD;  Location: [facility] ENDOSCOPY;  Service: Cardiovascular;  Laterality: N/A;  . TRANSTHORACIC ECHOCARDIOGRAM  08/15/2012   EF=>55%; normal LV systolic function; RV mildly dilated & systolic function mildly reduced; RA mod dilated; mild -mod MR & mildy increased gradients; mild TR; AV mildly sclerotic with mild-mod regurg    There were no vitals filed for this visit.  Subjective Assessment - 09/21/18 0859    Subjective   very little pain     Pertinent History  PD, hx of CVA mitral valve replacement, dyslipidemia, a-fib    Patient Stated Goals  maintain independence    Currently in Pain?  Yes    Pain Score  1     Pain Location  Shoulder    Pain Orientation  Left    Pain Descriptors / Indicators  Aching    Pain Type  Chronic pain    Pain Onset  More than a month ago    Pain Frequency  Intermittent    Aggravating Factors   shoulder abduction    Pain Relieving Factors  rest, proper positioning          Ultrasound 1 mhz, 1 w/cm 2, 50% x 8 mins to left upper arm arm and shoulder for pain relief, no adverse reactions (Pt still has bruising in 2 places on arm from recent fall)  Reviewed supine HEP updates from last session with adaptions (shoulder flex with cane, supine PWR! Up and rock) with min-mod cueing  In sitting, flipping cards with each hand with focus on ER of shoulder, supination/finger ext and avoiding shoulder hike, mod cueing.  In standing, functional reaching with mod cueing for positioning of shoulder (BUEs) and posture to decr pain.      OT Long Term Goals - 09/06/18 1634      OT LONG TERM  GOAL #1   Title  Pt will verbalize understanding of strategies/AE to assist with ADLs/IADLs prn.    Time  5    Period  Weeks    Status  New    Target Date  10/21/18      OT LONG TERM GOAL #2   Title  Pt will demonstrate ability to retrieve a lightweight object at 125 shoulder flexion with pain less than or equal to 3/10 with LUE.    Time  5    Period  Weeks    Status  New      OT LONG TERM GOAL #3   Title  Pt will be I with updated PD specific HEP following review    Time  5    Period  Weeks    Status  New      OT LONG TERM GOAL #4   Title  Pt will demonstrate improved RUE fine motor coordination as evidenced by decreasing 9 hole peg test score by 3 secs    Baseline  RUE 46.35, LUE 32.82 secs    Time  5    Period  Weeks    Status  New      OT LONG TERM GOAL #5   Title   ------------------------------------            Plan - 09/21/18 1257    Clinical Impression Statement  Pt reports that LUE/shoulder pain is improved.  Pt continues to need cueing for positioning due to cognitive deficits and benefits from repetition.    Occupational Profile and client history currently impacting functional performance  Parkinson's disease, CVA, mitral valve replacement, dyslipedemia, a-fib, Pt's deficits impede his ability to perfom ADLS, IADLS,  leisure activities including travel and yardwork.     Occupational performance deficits (Please refer to evaluation for details):  ADL's;IADL's;Leisure    Rehab Potential  Good    Current Impairments/barriers affecting progress:  cognitive deficits, decreased awareness of deficits    OT Frequency  2x / week    OT Duration  --   5 weeks   OT Treatment/Interventions  Self-care/ADL training;Therapeutic exercise;Visual/perceptual remediation/compensation;Patient/family education;Neuromuscular education;Moist Heat;Therapeutic activities;Cognitive remediation/compensation;DME and/or AE instruction;Cryotherapy    Plan  address LUE shoulder pain, continue with ADL strategies, begin checking goals    OT Home Exercise Plan  supine PWR! up and rock(with arm close to side until pt transitions to right side), cane shoulder flex in supin, basic coordination    Consulted and Agree with Plan of Care  Patient;Family member/caregiver    Family Member Consulted  wife       Patient will benefit from skilled therapeutic intervention in order to improve the following deficits and impairments:  Abnormal gait, Decreased cognition, Pain, Decreased mobility, Decreased coordination, Decreased activity tolerance, Decreased endurance, Decreased range of motion, Decreased strength, Impaired UE functional use, Impaired perceived functional ability, Difficulty walking, Decreased safety awareness, Decreased knowledge of precautions, Decreased balance  Visit  Diagnosis: Other symptoms and signs involving the musculoskeletal system  Other symptoms and signs involving the nervous system  Attention and concentration deficit  Other lack of coordination  Pain in left arm  Abnormal posture  Unsteadiness on feet  Other abnormalities of gait and mobility    Problem List Patient Active Problem List   Diagnosis Date Noted  . Encounter for therapeutic drug monitoring 01/05/2016  . Paroxysmal atrial fibrillation (HCC) 12/24/2015  . 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 HLD (hyperlipidemia) 06/02/2015  .  PD (Parkinson's disease) (HCC) 06/02/2015  . Mitral regurgitation 03/17/2015  . CVA (cerebral infarction)   . TIA (transient ischemic attack) 03/05/2015  . Slurred speech 03/05/2015  . Anxiety 12/19/2013  . Parkinson's disease (HCC) 08/06/2013  . Dyslipidemia 08/06/2013  . S/P mitral valve repair 08/06/2013    [facility] 09/21/2018, 1:01 PM  [facility] [address] [place], [place], [address] Phone: [phone]   Fax:  [phone]  Name: Zakariyya Helfman MRN: [id number] Date of Birth: 12/31/1931   Willa Frater, OTR/L [facility] [address] [place], [place]  [address] [phone] phone [phone] 09/21/18 1:02 PM

## 2018-09-25 ENCOUNTER — Ambulatory Visit (INDEPENDENT_AMBULATORY_CARE_PROVIDER_SITE_OTHER): Payer: Medicare Other | Admitting: Pharmacist

## 2018-09-25 DIAGNOSIS — Z5181 Encounter for therapeutic drug level monitoring: Secondary | ICD-10-CM

## 2018-09-25 DIAGNOSIS — G458 Other transient cerebral ischemic attacks and related syndromes: Secondary | ICD-10-CM | POA: Diagnosis not present

## 2018-09-25 DIAGNOSIS — I63412 Cerebral infarction due to embolism of left middle cerebral artery: Secondary | ICD-10-CM | POA: Diagnosis not present

## 2018-09-25 DIAGNOSIS — G459 Transient cerebral ischemic attack, unspecified: Secondary | ICD-10-CM

## 2018-09-25 DIAGNOSIS — I480 Paroxysmal atrial fibrillation: Secondary | ICD-10-CM | POA: Diagnosis not present

## 2018-09-25 DIAGNOSIS — Z9889 Other specified postprocedural states: Secondary | ICD-10-CM

## 2018-09-25 LAB — POCT INR: INR: 3.1 — AB (ref 2.0–3.0)

## 2018-09-26 ENCOUNTER — Encounter: Payer: Medicare Other | Admitting: Occupational Therapy

## 2018-09-26 ENCOUNTER — Ambulatory Visit: Payer: Medicare Other

## 2018-09-26 ENCOUNTER — Ambulatory Visit: Payer: Medicare Other | Admitting: Physical Therapy

## 2018-09-28 ENCOUNTER — Encounter: Payer: Self-pay | Admitting: Speech Pathology

## 2018-09-28 ENCOUNTER — Ambulatory Visit: Payer: Medicare Other | Attending: Neurology | Admitting: Speech Pathology

## 2018-09-28 ENCOUNTER — Encounter: Payer: Self-pay | Admitting: Physical Therapy

## 2018-09-28 ENCOUNTER — Ambulatory Visit: Payer: Medicare Other | Admitting: Physical Therapy

## 2018-09-28 DIAGNOSIS — M79602 Pain in left arm: Secondary | ICD-10-CM | POA: Insufficient documentation

## 2018-09-28 DIAGNOSIS — R41840 Attention and concentration deficit: Secondary | ICD-10-CM | POA: Diagnosis present

## 2018-09-28 DIAGNOSIS — R29818 Other symptoms and signs involving the nervous system: Secondary | ICD-10-CM | POA: Insufficient documentation

## 2018-09-28 DIAGNOSIS — R293 Abnormal posture: Secondary | ICD-10-CM | POA: Insufficient documentation

## 2018-09-28 DIAGNOSIS — R278 Other lack of coordination: Secondary | ICD-10-CM | POA: Insufficient documentation

## 2018-09-28 DIAGNOSIS — R471 Dysarthria and anarthria: Secondary | ICD-10-CM | POA: Insufficient documentation

## 2018-09-28 DIAGNOSIS — R2689 Other abnormalities of gait and mobility: Secondary | ICD-10-CM | POA: Diagnosis present

## 2018-09-28 DIAGNOSIS — R29898 Other symptoms and signs involving the musculoskeletal system: Secondary | ICD-10-CM | POA: Diagnosis present

## 2018-09-28 DIAGNOSIS — R4701 Aphasia: Secondary | ICD-10-CM | POA: Insufficient documentation

## 2018-09-28 DIAGNOSIS — R41841 Cognitive communication deficit: Secondary | ICD-10-CM | POA: Insufficient documentation

## 2018-09-28 DIAGNOSIS — R2681 Unsteadiness on feet: Secondary | ICD-10-CM | POA: Diagnosis present

## 2018-09-28 NOTE — Therapy (Signed)
[facility] [address] [place], [place], [address] Phone: [phone]   Fax:  [phone]  Physical Therapy Treatment  Patient Details  Name: [name] [name] MRN: [id number] Date of Birth: [birth year] Referring Provider (PT): Dr. Rubin Payor   Encounter Date: 09/28/2018  PT End of Session - 09/28/18 0804    Visit Number  2    Number of Visits  9   New POC 09/05/18   Date for PT Re-Evaluation  11/04/18    Authorization Type  UHC Medicare    Authorization Time Period  09/05/18 to 11/04/18    PT Start Time  0802    PT Stop Time  0844    PT Time Calculation (min)  42 min    Activity Tolerance  Patient tolerated treatment well    Behavior During Therapy  [facility] for tasks assessed/performed       Past Medical History:  Diagnosis Date  . Dyslipidemia   . Parkinson's disease (HCC)   . S/P mitral valve replacement    mitral regurg, endocarditis  . Stroke [facility])     Past Surgical History:  Procedure Laterality Date  . CATARACT EXTRACTION  2009, 2012   right 2009, left 2012  . LOOP RECORDER IMPLANT Left 03/13/2015   Procedure: LOOP RECORDER IMPLANT;  Surgeon: Marinus Maw, MD;  Location: [facility] CATH LAB;  Service: Cardiovascular;  Laterality: Left;  . 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 TEE WITHOUT CARDIOVERSION N/A 03/13/2015   Procedure: TRANSESOPHAGEAL ECHOCARDIOGRAM (TEE);  Surgeon: Laurey Morale, MD;  Location: [facility] ENDOSCOPY;  Service: Cardiovascular;  Laterality: N/A;  . TRANSTHORACIC ECHOCARDIOGRAM  08/15/2012   EF=>55%; normal LV systolic function; RV mildly dilated & systolic function mildly reduced; RA mod dilated; mild -mod MR & mildy increased gradients; mild TR; AV mildly sclerotic with mild-mod regurg    There were no vitals filed for this visit.  Subjective Assessment - 09/28/18 0804    Subjective  No changes since eval.  No falls. Reports he's learned to "tuck and roll" when he falls. Later in session, pt denies back pain as cause for flexed position "I think I try to get low to the ground to feel more balanced."    Patient is accompained by:  Family member   wife, [name]   Patient Stated Goals  When asked about goals for therapy:  "I'm not sure, I feel pretty good about things."  Wife reports posture and lessening stutter-stepping.    Currently in Pain?  No/denies    Pain Onset  More than a month ago                       [facility] Adult PT Treatment/Exercise - 09/28/18 0848      Transfers   Transfers  Sit to Stand;Stand to Sit    Sit to Stand  5: Supervision    Sit to Stand Details (indicate cue type and reason)  vc for scooting to edge prior to stand; pt reports difficulty scooting on some fabric/surfaces; educated in "walking" one hip forward at a time    Stand to Sit  5: Supervision    Stand to Sit Details  vc to slow and control descent      Ambulation/Gait   Ambulation/Gait  Yes    Ambulation/Gait Assistance  5: Supervision    Ambulation/Gait Assistance Details  vc and tactile cues for upright posture and placing cane slightly farther out to the side (his cane is too short and cannot adjust, used clinic cane)    Ambulation Distance (Feet)  200 Feet   50   Assistive device  Straight cane    Gait Pattern  Step-through pattern;Decreased arm swing - right;Decreased arm swing - left;Decreased step length - left;Decreased step length - right;Trunk flexed;Right flexed knee in stance;Left flexed knee in stance    Ambulation Surface  Indoor      Posture/Postural Control   Posture/Postural Control  Postural limitations    Postural Limitations  Forward head;Rounded Shoulders;Flexed trunk;Posterior pelvic tilt    Posture Comments  vc and tactile cues for posture throughout all activities; pt denies back pain as cause for flexed position "I think I try to get low to the ground to feel more  balanced."      Exercises   Exercises  Other Exercises    Other Exercises   see pt instructions for all ex's instructed in for HEP          Balance Exercises - 09/28/18 0853      Balance Exercises: Standing   SLS  Eyes open;Upper extremity support 1;5 reps;10 secs   each leg; instructed to do in corner--pt has not been       PT Education - 09/28/18 0854    Education Details  see pt instructions; upright posture to improve balance "over your BOS"; proper adjustment of SPC height; HEP updates    Person(s) Educated  Patient;Spouse    Methods  Explanation;Demonstration;Tactile cues;Verbal cues;Handout    Comprehension  Verbalized understanding;Returned demonstration;Verbal cues required;Tactile cues required;Need further instruction          PT Long Term Goals - 09/28/18 0902      PT LONG TERM GOAL #1   Title  Pt will perform HEP with wife's supervision for improved balance, posture, and mobility.  TARGET 10/06/18 (Delayed start TARGET 10/28/2018)    Time  4    Period  Weeks    Status  New    Target Date  10/28/18      PT LONG TERM GOAL #2   Title  Pt will improve MiniBESTest to at least 2/28 for improved balance/decreased fall risk.    Time  4    Period  Weeks    Status  New      PT LONG TERM GOAL #3   Title  Pt/wife will verbalize understanding of tips to reduce freezing episodes with gait and turns.    Time  4    Period  Weeks    Status  New            Plan - 09/28/18 0856    Clinical Impression Statement  Patient returns for first treatment session after evaluation 09/05/18 (3 weeks ago) due to high PT census and difficulty getting scheduled. Target dates for goals updated. Session initially focused on review of his current HEP and updating as needed. (Note pt has been able to get in to see OT in the previous 3 weeks and they have given pt supine PWR! (modified due to left shoulder pain) exercises. Throughout session also focused on posture, gait, slowing  movements to improve control and balance. Wife present, very attentive and supportive.     Rehab Potential  Good    PT Frequency  2x / week    PT Duration  4 weeks   plus eval   PT Treatment/Interventions  ADLs/Self Care Home Management;DME Instruction;Balance training;Therapeutic exercise;Therapeutic activities;Functional mobility training;Gait training;Neuromuscular re-education;Patient/family education    PT Next Visit Plan  See if any ?'s re: updates to HEP 11/7; continue to focus on posture, gait and turning strategies    Consulted and Agree with Plan of Care  Patient    Family Member Consulted  wife, [name]       Patient will benefit from skilled therapeutic intervention in order to improve the following deficits and impairments:  Abnormal gait, Decreased balance, Decreased safety awareness, Decreased mobility, Decreased strength, Difficulty walking, Postural dysfunction  Visit Diagnosis: Other symptoms and signs involving the musculoskeletal system  Other symptoms and signs involving the nervous system  Abnormal posture  Other abnormalities of gait and mobility     Problem List Patient Active Problem List   Diagnosis Date Noted  . Encounter for therapeutic drug monitoring 01/05/2016  . Paroxysmal atrial fibrillation (HCC) 12/24/2015  . 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 HLD (hyperlipidemia) 06/02/2015  . PD (Parkinson's disease) (HCC) 06/02/2015  . Mitral regurgitation 03/17/2015  . CVA (cerebral infarction)   . TIA (transient ischemic attack) 03/05/2015  . Slurred speech 03/05/2015  . Anxiety 12/19/2013  . Parkinson's disease (HCC) 08/06/2013  . Dyslipidemia 08/06/2013  . S/P mitral valve repair 08/06/2013    Zena Amos, PT 09/28/2018, 9:04 AM  [facility] [address] [place], [place], [address] Phone: [phone]   Fax:   [phone]  Name: [name] [name] MRN: [id number] Date of Birth: Jul 01, 1932

## 2018-09-28 NOTE — Patient Instructions (Signed)
5 "HEY AHH!" twice a day  Read these phrases focus on loud volume twice a day  Or read aloud for 5 minutes so that your wife can hear you in the next room  What time is it?  How was your day, Ree Kida?  Do you have any interesting cases going on?  How is your case going, Kathlene November?  How is the basketball academy going, Delrae Alfred?  Let's Go [employer]!  Let's Go [place]!  Where should we go for dinner?  She goes to [employer] on scholarship  Do we have a lunch date this week?  Macker is coming over for Thanksgiving  Continue energy compensation, resting during the day when you have events at night  Have your more important, technical legal, insurance medical talks when you more alert    Get the persons attention before you speak  Use eye contact and face the person you are speaking to  Be in close proximity to the person you are speaking to  Turn down any noise in the environment such as the TV, walk away from loud appliances, air conditioners, fans, dish washers etc

## 2018-09-28 NOTE — Therapy (Signed)
[facility] [address] [place], [place], [address] Phone: [phone]   Fax:  [phone]  Speech Language Pathology Treatment  Patient Details  Name: [name] [name] MRN: [id number] Date of Birth: [birth year] Referring Provider (SLP): Dr. Jaquita Folds   Encounter Date: 09/28/2018  End of Session - 09/28/18 1132    Visit Number  2    Number of Visits  17    Date for SLP Re-Evaluation  11/27/18    SLP Start Time  0844    SLP Stop Time   0930    SLP Time Calculation (min)  46 min    Activity Tolerance  Patient tolerated treatment well       Past Medical History:  Diagnosis Date  . Dyslipidemia   . Parkinson's disease (HCC)   . S/P mitral valve replacement    mitral regurg, endocarditis  . Stroke [facility])     Past Surgical History:  Procedure Laterality Date  . CATARACT EXTRACTION  2009, 2012   right 2009, left 2012  . LOOP RECORDER IMPLANT Left 03/13/2015   Procedure: LOOP RECORDER IMPLANT;  Surgeon: Marinus Maw, MD;  Location[facility] CATH LAB;  Service: Cardiovascular;  Laterality: Left;  . 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 TEE WITHOUT CARDIOVERSION N/A 03/13/2015   Procedure: TRANSESOPHAGEAL ECHOCARDIOGRAM (TEE);  Surgeon: Laurey Morale, MD;  Location: [facility] ENDOSCOPY;  Service: Cardiovascular;  Laterality: N/A;  . TRANSTHORACIC ECHOCARDIOGRAM  08/15/2012   EF=>55%; normal LV systolic function; RV mildly dilated & systolic function mildly reduced; RA mod dilated; mild -mod MR & mildy increased gradients; mild TR; AV mildly sclerotic with mild-mod regurg    There were no vitals filed for this visit.  Subjective Assessment - 09/28/18 0855    Subjective  "I'm trying to talk louder in here"    Currently in Pain?  No/denies            ADULT SLP TREATMENT - 09/28/18 0857      General Information   Behavior/Cognition  Alert;Cooperative;Pleasant mood      Treatment Provided   Treatment provided  Cognitive-Linquistic      Pain Assessment   Pain Assessment  No/denies pain      Cognitive-Linquistic Treatment   Treatment focused on  Dysarthria    Skilled Treatment  Pt had not been completing loud HEY AH since eval. Re-trained pt on 5x twice daily, followed by loud oral reading to start. HEY AH to recalibrate volume today average of 89dB with occasional min A. Generated personally relevant phrases for twice daily HEP. Pt required frequent mod verbal, and written cues in betwen phrases for reduced rate and pause. Trained pt on breathing more freuquently to keep volume up and rate reduced with frequent mod A. Environmental compensations to improve spouse's comprehension of pt's speech provided.       Assessment / Recommendations / Plan   Plan  Continue with current plan of care      Progression Toward Goals   Progression toward goals  Progressing toward goals       SLP Education - 09/28/18 1128    Education provided  Yes    Education Details  HEP for dysarthria; compensations for dysarthria; energy conservation    Person(s) Educated  Patient;Spouse    Methods  Explanation;Demonstration;Verbal cues    Comprehension  Verbalized understanding;Returned demonstration;Verbal cues required;Need further instruction  SLP Short Term Goals - 09/28/18 1131      SLP SHORT TERM GOAL #1   Title  pt will maintain loud /a/ or "HEY - Ah" average 90dB X 3 sessions    Time  4    Period  Weeks    Status  On-going      SLP SHORT TERM GOAL #2   Title  pt will demo 17/20 sentences with average low 70s dB x2 sessions    Time  4    Period  Weeks    Status  On-going      SLP SHORT TERM GOAL #3   Title  Pt will demonstrate WNL rate of speech 15/20 sentneces with occasional min A over 2 sessions    Time  4    Period  Weeks    Status  On-going      SLP SHORT TERM GOAL #4   Title  Spouse and or  Pt will tell SLP 3 ways to compensate for reduced attention    Time  4    Period  Weeks    Status  On-going       SLP Long Term Goals - 09/28/18 1132      SLP LONG TERM GOAL #1   Title  pt will demo loudness average low 70s dB  in 10 minutes simple conversation with rare visual cues x3 sessions    Time  8    Period  Weeks    Status  On-going      SLP LONG TERM GOAL #2   Title  Pt will be 95% intelligible over 8 minute simple conversation in mildly noisy environment with occasional min A over 2 sessions    Time  8    Period  Weeks    Status  On-going      SLP LONG TERM GOAL #3   Status  On-going      SLP LONG TERM GOAL #5   Status  On-going       Plan - 09/28/18 1128    Clinical Impression Statement  Initiated training of HEP for dysarthria as pt had not completed HEP since eval. Trained spouse and pt in environmental compensations to improve intellgilbity. Spouse continues to report pt frequently unintelligible at home with family and friends, with reduced volume and rapid rate. Continue skilled ST to maximize intelligibility.     Speech Therapy Frequency  2x / week    Duration  --   8 weeks or 17 visits   Treatment/Interventions  SLP instruction and feedback;Compensatory strategies;Patient/family education;Multimodal communcation approach;Internal/external aids;Cognitive reorganization;Functional tasks;Cueing hierarchy;Environmental controls    Potential to Achieve Goals  Good    Potential Considerations  Cooperation/participation level;Ability to learn/carryover information    Consulted and Agree with Plan of Care  Patient;Family member/caregiver    Family Member Consulted  wife, [name]       Patient will benefit from skilled therapeutic intervention in order to improve the following deficits and impairments:   Dysarthria and anarthria    Problem List Patient Active Problem List   Diagnosis Date Noted  . Encounter for therapeutic drug monitoring 01/05/2016  .  Paroxysmal atrial fibrillation (HCC) 12/24/2015  . 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 HLD (hyperlipidemia) 06/02/2015  . PD (Parkinson's disease) (HCC) 06/02/2015  . Mitral regurgitation 03/17/2015  . CVA (cerebral infarction)   . TIA (transient ischemic attack) 03/05/2015  . Slurred speech 03/05/2015  . Anxiety 12/19/2013  .  Parkinson's disease (HCC) 08/06/2013  . Dyslipidemia 08/06/2013  . S/P mitral valve repair 08/06/2013    Lovvorn, Radene Journey MS, CCC-SLP 09/28/2018, 11:33 AM  [facility] [address] [place], [place], [address] Phone: [phone]   Fax:  [phone]   Name: Ahmad Vanwey MRN: [id number] Date of Birth: 10-17-1932

## 2018-09-28 NOTE — Patient Instructions (Signed)
  1. Listen to your wife! She's your coach when you're not at PT. 2. Stand tall 3. SLOW DOWN   Knee bends behind your chair with a chair behind you in case you lose your balance backwards. MOVE SLOWLY. As you come up go to your PWR Up position (arms lower than shoulders and hands wide open). Slow count to 3 while you stretch. Count oiut LOUD.   Rocking on the ball of your foot- Stand behind your chair with a seat behind you for safety. SLOWLY rise up on your toes, count OUT LOUD to 3, and then lower.    Single leg stand-stand with your back to the corner and a chair in front of you. Put one finger on the chair and stand on one leg with goal of 10 seconds.    Your cane needs to be 1" taller!! Walk tall with shoulders up (think PWR! Up!)

## 2018-09-29 ENCOUNTER — Ambulatory Visit: Payer: Medicare Other | Admitting: Occupational Therapy

## 2018-09-29 DIAGNOSIS — M79602 Pain in left arm: Secondary | ICD-10-CM

## 2018-09-29 DIAGNOSIS — R29818 Other symptoms and signs involving the nervous system: Secondary | ICD-10-CM

## 2018-09-29 DIAGNOSIS — R471 Dysarthria and anarthria: Secondary | ICD-10-CM | POA: Diagnosis not present

## 2018-09-29 DIAGNOSIS — R293 Abnormal posture: Secondary | ICD-10-CM

## 2018-09-29 DIAGNOSIS — R29898 Other symptoms and signs involving the musculoskeletal system: Secondary | ICD-10-CM

## 2018-09-29 NOTE — Therapy (Addendum)
[facility] [address] [place], [place], [address] Phone: [phone]   Fax:  [phone]  Occupational Therapy Treatment  Patient Details  Name: [name] [name] MRN: [id number] Date of Birth: 13-Nov-1932 Referring Provider (OT): Dr. Rubin Payor   Encounter Date: 09/29/2018  OT End of Session - 09/29/18 1449    Visit Number  7    Number of Visits  8    Date for OT Re-Evaluation  10/06/18    Authorization Type  UHC MCR    Authorization - Visit Number  7    Authorization - Number of Visits  10    OT Start Time  [phone]   pt late   OT Stop Time  1015    OT Time Calculation (min)  33 min    Activity Tolerance  Patient tolerated treatment well    Behavior During Therapy  [facility] for tasks assessed/performed       Past Medical History:  Diagnosis Date  . Dyslipidemia   . Parkinson's disease (HCC)   . S/P mitral valve replacement    mitral regurg, endocarditis  . Stroke [facility])     Past Surgical History:  Procedure Laterality Date  . CATARACT EXTRACTION  2009, 2012   right 2009, left 2012  . LOOP RECORDER IMPLANT Left 03/13/2015   Procedure: LOOP RECORDER IMPLANT;  Surgeon: Marinus Maw, MD;  Location: [facility] CATH LAB;  Service: Cardiovascular;  Laterality: Left;  . 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 TEE WITHOUT CARDIOVERSION N/A 03/13/2015   Procedure: TRANSESOPHAGEAL ECHOCARDIOGRAM (TEE);  Surgeon: Laurey Morale, MD;  Location: [facility] ENDOSCOPY;  Service: Cardiovascular;  Laterality: N/A;  . TRANSTHORACIC ECHOCARDIOGRAM  08/15/2012   EF=>55%; normal LV systolic function; RV mildly dilated & systolic function mildly reduced; RA mod dilated; mild -mod MR & mildy increased gradients; mild TR; AV mildly sclerotic with mild-mod regurg    There were no vitals filed for this visit.     Subjective Assessment    Subjective   Pt reports shoulder  pain is better    Pertinent History  PD, hx of CVA mitral valve replacement, dyslipidemia, a-fib    Currently in Pain?  Yes    Pain Score  2     Pain Location  Shoulder    Pain Orientation  Left    Pain Descriptors / Indicators  Aching    Pain Type  Chronic pain    Pain Onset  More than a month ago    Pain Frequency  Intermittent    Aggravating Factors   malpositioning    Pain Relieving Factors  proper positioning               Treatment:  Ultrasound 1 mhz, 1 w/cm 2, 20% x 8 mins to left upper arm arm and shoulder for pain relief, no adverse reactions (Pt still has bruising in 2 places on arm from recent fall)  Reviewed supine HEP (shoulder flex with cane, supine PWR! Up and rock) with min-mod cueing, modification to rock to keep arm close to body, cueing for proper shoulder scapular positioning Education regarding proper cane height with pt/ wife.              OT Long Term Goals - 09/06/18 1634      OT LONG TERM GOAL #1   Title  Pt will verbalize understanding of strategies/AE to  assist with ADLs/IADLs prn.    Time  5    Period  Weeks    Status  New    Target Date  10/21/18      OT LONG TERM GOAL #2   Title  Pt will demonstrate ability to retrieve a lightweight object at 125 shoulder flexion with pain less than or equal to 3/10 with LUE.    Time  5    Period  Weeks    Status  New      OT LONG TERM GOAL #3   Title  Pt will be I with updated PD specific HEP following review    Time  5    Period  Weeks    Status  New      OT LONG TERM GOAL #4   Title  Pt will demonstrate improved RUE fine motor coordination as evidenced by decreasing 9 hole peg test score by 3 secs    Baseline  RUE 46.35, LUE 32.82 secs    Time  5    Period  Weeks    Status  New      OT LONG TERM GOAL #5   Title  ------------------------------------           Plan -   Clinical Impression Statement  Pt reports that LUE/shoulder pain is improved.  Pt continues to require v.c  for proper positioning to minimize shoulder pain.    Occupational Profile and client history currently impacting functional performance  Parkinson's disease, CVA, mitral valve replacement, dyslipedemia, a-fib, Pt's deficits impede his ability to perfom ADLS, IADLS,  leisure activities including travel and yardwork.     Occupational performance deficits (Please refer to evaluation for details):  ADL's;IADL's;Leisure    Rehab Potential  Good    Current Impairments/barriers affecting progress:  cognitive deficits, decreased awareness of deficits    OT Frequency  2x / week    OT Duration  --   5 weeks   OT Treatment/Interventions  Self-care/ADL training;Therapeutic exercise;Visual/perceptual remediation/compensation;Patient/family education;Neuromuscular education;Moist Heat;Therapeutic activities;Cognitive remediation/compensation;DME and/or AE instruction;Cryotherapy    Plan  check goals, anticipate d/c next visit    OT Home Exercise Plan  supine PWR! up and rock(with arm close to side until pt transitions to right side), cane shoulder flex in supin, basic coordination    Consulted and Agree with Plan of Care  Patient;Family member/caregiver    Family Member Consulted  wife          Patient will benefit from skilled therapeutic intervention in order to improve the following deficits and impairments:   decreased coordination, pain, decreased balance, cognitive deficits, abnormal posture, decreased balance  Visit Diagnosis: other lack of coordination, abnormal posture, left shoulder pain    Problem List Patient Active Problem List   Diagnosis Date Noted  . Encounter for therapeutic drug monitoring 01/05/2016  . Paroxysmal atrial fibrillation (HCC) 12/24/2015  . 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 HLD (hyperlipidemia) 06/02/2015  . PD (Parkinson's disease) (HCC) 06/02/2015  . Mitral regurgitation 03/17/2015  . CVA (cerebral infarction)    . TIA (transient ischemic attack) 03/05/2015  . Slurred speech 03/05/2015  . Anxiety 12/19/2013  . Parkinson's disease (HCC) 08/06/2013  . Dyslipidemia 08/06/2013  . S/P mitral valve repair 08/06/2013    RINE,KATHRYN 09/29/2018, 2:51 PM  [facility] [address] [place], [place], [address] Phone: [phone]   Fax:  [phone]  Name: [name] [name] MRN: [id number]  Date of Birth: 1932-05-01

## 2018-10-02 ENCOUNTER — Ambulatory Visit: Payer: Medicare Other | Admitting: Physical Therapy

## 2018-10-02 ENCOUNTER — Ambulatory Visit: Payer: Medicare Other

## 2018-10-03 ENCOUNTER — Encounter: Payer: Medicare Other | Admitting: Occupational Therapy

## 2018-10-04 ENCOUNTER — Ambulatory Visit: Payer: Medicare Other | Admitting: Occupational Therapy

## 2018-10-04 DIAGNOSIS — R29898 Other symptoms and signs involving the musculoskeletal system: Secondary | ICD-10-CM

## 2018-10-04 DIAGNOSIS — R29818 Other symptoms and signs involving the nervous system: Secondary | ICD-10-CM

## 2018-10-04 DIAGNOSIS — R278 Other lack of coordination: Secondary | ICD-10-CM

## 2018-10-04 DIAGNOSIS — R471 Dysarthria and anarthria: Secondary | ICD-10-CM | POA: Diagnosis not present

## 2018-10-04 NOTE — Patient Instructions (Addendum)
Dressing tips   Sit down to put on your shirt, remove shirt by pulling from back collar   Sit down when putting shirt on, dress left arm first, put head through neck hole, then dress right arm, really push arms through sleeves with big movements, open your hand and pull shirt down with a bigger movement  Sit down when putting on pants, dress each leg while sitting and pull pants up to your hips, then stand up to finish pulling pants up to your waist  Sit down for washing your legs in the shower instead of standing on one foot   Whenever you are dressing or bathing think about would this activity be safer if I was sitting down  Continue to perform your coordination home exercises  Laying down on your back continue to perform the PWR! Up- squeeze shoulder blades back x 10 reps PWR! Rock! Reach for headboard x 10 reps each side  Hold cane, squeeze shoulders back, then raise cane overhead 10x go  SLOW!

## 2018-10-04 NOTE — Therapy (Signed)
[facility] [address] [place], [place], [address] Phone: [phone]   Fax:  [phone]  Occupational Therapy Treatment  Patient Details  Name: [name] [name] MRN: [id number] Date of Birth: [birth year] Referring Provider (OT): Dr. Linus Mako   Encounter Date: 10/04/2018  OT End of Session - 10/04/18 1039    Visit Number  8    Date for OT Re-Evaluation  10/06/18    Authorization Type  UHC MCR    Authorization - Visit Number  8    Authorization - Number of Visits  10    OT Start Time  [id number]   Pt's wife signed pt in while therapist initated tx so it looks like he arrived later.   OT Stop Time  0848    OT Time Calculation (min)  42 min    Activity Tolerance  Patient tolerated treatment well    Behavior During Therapy  WFL for tasks assessed/performed       Past Medical History:  Diagnosis Date  . Dyslipidemia   . Parkinson's disease ([place])   . S/P mitral valve replacement    mitral regurg, endocarditis  . Stroke [facility])     Past Surgical History:  Procedure Laterality Date  . CATARACT EXTRACTION  2009, 2012   right 2009, left 2012  . LOOP RECORDER IMPLANT Left 03/13/2015   Procedure: LOOP RECORDER IMPLANT;  Surgeon: Evans Lance, MD;  Location: [facility] CATH LAB;  Service: Cardiovascular;  Laterality: Left;  . MITRAL VALVE ANNULOPLASTY  04/10/2007    10m Edwards ring; r/t h/o MR and flail mitral leaflets due to endocarditis: MRI safe  . TEE WITHOUT CARDIOVERSION N/A 03/13/2015   Procedure: TRANSESOPHAGEAL ECHOCARDIOGRAM (TEE);  Surgeon: DLarey Dresser MD;  Location: [facility]  Service: Cardiovascular;  Laterality: N/A;  . TRANSTHORACIC ECHOCARDIOGRAM  08/15/2012   EF=>55%; normal LV systolic function; RV mildly dilated & systolic function mildly reduced; RA mod dilated; mild -mod MR & mildy increased gradients; mild TR; AV mildly sclerotic with mild-mod regurg    There were no vitals filed for this  visit.  Subjective Assessment - 10/04/18 1038    Subjective   Pt denies shoulder pain    Pertinent History  PD, hx of CVA mitral valve replacement, dyslipidemia, a-fib    Patient Stated Goals  maintain independence    Currently in Pain?  No/denies              Treatment: Pt practiced donning/ doffing shirt in seated with adapted strategies. Pt returned demonstration following education, pt's wife verbalized understanding. Therapist reviewed exercises in supine with pt/ wife, pt returned demonstration. Flipping and dealing playing cards with big movements, min v.c Therapist checked progress towards goals, pt and wife agree with d/c.               OT Education - 10/04/18 1044    Education Details  Adapted strategies fo ADLS to minimize shoulder pain and fall risk, HEP review, see pt instructions    Person(s) Educated  Patient;Spouse    Methods  Explanation;Demonstration;Verbal cues;Handout    Comprehension  Verbalized understanding;Returned demonstration;Verbal cues required          OT Long Term Goals - 10/04/18 0831      OT LONG TERM GOAL #1   Title  Pt will verbalize understanding of strategies/AE to assist with ADLs/IADLs prn.    Status  Achieved      OT LONG TERM GOAL #2   Title  Pt  will demonstrate ability to retrieve a lightweight object at 125 shoulder flexion with pain less than or equal to 3/10 with LUE.    Status  Achieved   no pain, pt able to perform 125*     OT LONG TERM GOAL #3   Title  Pt will be I with updated PD specific HEP following review    Status  Achieved      OT LONG TERM GOAL #4   Title  Pt will demonstrate improved RUE fine motor coordination as evidenced by decreasing 9 hole peg test score by 3 secs    Status  Achieved   36.03 secs           Plan - 10/04/18 1041    Clinical Impression Statement  Pt reports that LUE/shoulder pain is improved.  Pt and wife demonstrate understanding of all education and agree with plans  for d/c.    Occupational Profile and client history currently impacting functional performance  Parkinson's disease, CVA, mitral valve replacement, dyslipedemia, a-fib, Pt's deficits impede his ability to perfom ADLS, IADLS,  leisure activities including travel and yardwork.     Occupational performance deficits (Please refer to evaluation for details):  ADL's;IADL's;Leisure    Rehab Potential  Good    Current Impairments/barriers affecting progress:  cognitive deficits, decreased awareness of deficits    OT Frequency  2x / week    OT Duration  --   5 weeks   OT Treatment/Interventions  Self-care/ADL training;Therapeutic exercise;Visual/perceptual remediation/compensation;Patient/family education;Neuromuscular education;Moist Heat;Therapeutic activities;Cognitive remediation/compensation;DME and/or AE instruction;Cryotherapy    Plan  d/c OT    OT Home Exercise Plan  supine PWR! up and rock(with arm close to side until pt transitions to right side), cane shoulder flex in supine, basic coordination    Consulted and Agree with Plan of Care  Patient;Family member/caregiver    Family Member Consulted  wife       Patient will benefit from skilled therapeutic intervention in order to improve the following deficits and impairments:  Abnormal gait, Decreased cognition, Pain, Decreased mobility, Decreased coordination, Decreased activity tolerance, Decreased endurance, Decreased range of motion, Decreased strength, Impaired UE functional use, Impaired perceived functional ability, Difficulty walking, Decreased safety awareness, Decreased knowledge of precautions, Decreased balance  Visit Diagnosis: Other symptoms and signs involving the musculoskeletal system  Other symptoms and signs involving the nervous system  Other lack of coordination    Problem List Patient Active Problem List   Diagnosis Date Noted  . Encounter for therapeutic drug monitoring 01/05/2016  . Paroxysmal atrial fibrillation  ([place]) 12/24/2015  . Cerebral infarction due to embolism of left middle cerebral artery ([place]) 06/02/2015  . HLD (hyperlipidemia) 06/02/2015  . PD (Parkinson's disease) ([place]) 06/02/2015  . Mitral regurgitation 03/17/2015  . CVA (cerebral infarction)   . TIA (transient ischemic attack) 03/05/2015  . Slurred speech 03/05/2015  . Anxiety 12/19/2013  . Parkinson's disease ([place]) 08/06/2013  . Dyslipidemia 08/06/2013  . S/P mitral valve repair 08/06/2013  OCCUPATIONAL THERAPY DISCHARGE SUMMARY    Current functional level related to goals / functional outcomes: Pt made excellent overall progress. He does not have any shoulder pain and he achieved all goals.   Remaining deficits: Bradykinesia, rigidity, abnormal posture, cognitive deficits, decreased balance, decreased coordination   Education / Equipment: Pt and wife were educated in adapted strategies for ADLS, ways to prevent future complications and HEP. Pt / wife verbalize understanding of all education.  Plan: Patient agrees to discharge.  Patient goals were met. Patient  is being discharged due to meeting the stated rehab goals.  ?????      [name] [name] 10/04/2018, 10:45 AM [name] [name], OTR/L [phone] Phone: [phone] 10:48 AM 10/04/18 [place] [address] [place], [place], [address] Phone: [phone]   Fax:  [phone]  Name: [name] [name] MRN: [id number] Date of Birth: 13-Aug-1932

## 2018-10-05 ENCOUNTER — Ambulatory Visit: Payer: Medicare Other

## 2018-10-05 ENCOUNTER — Ambulatory Visit: Payer: Medicare Other | Admitting: Physical Therapy

## 2018-10-05 ENCOUNTER — Encounter: Payer: Self-pay | Admitting: Physical Therapy

## 2018-10-05 DIAGNOSIS — R293 Abnormal posture: Secondary | ICD-10-CM

## 2018-10-05 DIAGNOSIS — R41840 Attention and concentration deficit: Secondary | ICD-10-CM

## 2018-10-05 DIAGNOSIS — R471 Dysarthria and anarthria: Secondary | ICD-10-CM

## 2018-10-05 DIAGNOSIS — R41841 Cognitive communication deficit: Secondary | ICD-10-CM

## 2018-10-05 DIAGNOSIS — R4701 Aphasia: Secondary | ICD-10-CM

## 2018-10-05 DIAGNOSIS — R2681 Unsteadiness on feet: Secondary | ICD-10-CM

## 2018-10-05 DIAGNOSIS — R2689 Other abnormalities of gait and mobility: Secondary | ICD-10-CM

## 2018-10-05 NOTE — Patient Instructions (Addendum)
   In order to have success in increasing loudness you will need to complete loud "ah" 5 times TWICE EACH DAY.   Please complete the assigned speech therapy homework prior to your next session and return it to the speech therapist at your next visit.

## 2018-10-05 NOTE — Therapy (Signed)
[facility] [address] [place], [place], [address] Phone: [phone]   Fax:  [phone]  Physical Therapy Treatment  Patient Details  Name: [name] [name] MRN: [id number] Date of Birth: 1932-10-18 Referring Provider (PT): Dr. Rubin Payor   Encounter Date: 10/05/2018  PT End of Session - 10/05/18 2111    Visit Number  3    Number of Visits  9   New POC 09/05/18   Date for PT Re-Evaluation  11/04/18    Authorization Type  UHC Medicare    Authorization Time Period  09/05/18 to 11/04/18    PT Start Time  0847    PT Stop Time  0930    PT Time Calculation (min)  43 min    Activity Tolerance  Patient tolerated treatment well    Behavior During Therapy  [facility] for tasks assessed/performed       Past Medical History:  Diagnosis Date  . Dyslipidemia   . Parkinson's disease ([facility])   . S/P mitral valve replacement    mitral regurg, endocarditis  . Stroke [facility])     Past Surgical History:  Procedure Laterality Date  . CATARACT EXTRACTION  2009, 2012   right 2009, left 2012  . LOOP RECORDER IMPLANT Left 03/13/2015   Procedure: LOOP RECORDER IMPLANT;  Surgeon: Marinus Maw, MD;  Location: [facility] CATH LAB;  Service: Cardiovascular;  Laterality: Left;  . 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 TEE WITHOUT CARDIOVERSION N/A 03/13/2015   Procedure: TRANSESOPHAGEAL ECHOCARDIOGRAM (TEE);  Surgeon: Laurey Morale, MD;  Location: [facility] ENDOSCOPY;  Service: Cardiovascular;  Laterality: N/A;  . TRANSTHORACIC ECHOCARDIOGRAM  08/15/2012   EF=>55%; normal LV systolic function; RV mildly dilated & systolic function mildly reduced; RA mod dilated; mild -mod MR & mildy increased gradients; mild TR; AV mildly sclerotic with mild-mod regurg    There were no vitals filed for this visit.  Subjective Assessment - 10/05/18 0851    Subjective  Had a tripping episode,  but no fall.      Patient is accompained by:  Family member   wife, [name] [name]   Patient Stated Goals  When asked about goals for therapy:  "I'm not sure, I feel pretty good about things."  Wife reports posture and lessening stutter-stepping.    Currently in Pain?  No/denies    Pain Onset  More than a month ago                       [facility] Adult PT Treatment/Exercise - 10/05/18 0001      Ambulation/Gait   Ambulation/Gait  Yes    Ambulation/Gait Assistance  5: Supervision    Ambulation/Gait Assistance Details  With gait, practiced turns, with cues for widened BOS and rocking to lift feet (as pt reports she cues him this way at home)    Ambulation Distance (Feet)  230 Feet   then 80 ft x 3    Assistive device  Straight cane;Rollator    Gait Pattern  Step-through pattern;Decreased arm swing - right;Decreased arm swing - left;Decreased step length - left;Decreased step length - right;Trunk flexed;Right flexed knee in stance;Left flexed knee in stance    Ambulation Surface  Level;Indoor    Pre-Gait Activities  Checked O2 sats several times during session, as pt seems short of breath.  No c/o and O2 sats range 92-94%.  Gait Comments  Practiced gait with rollator x 150 ft to attempt to address improved stability with gait and increased step length.  Pt has increased upper body and head dyskinesias compared to when using cane.  Also, pt needs cues to stay close to rollator and for increased step length.  Pt will need more practice with rollator if this is to be a safe option for him for gait.  (Currently, cane looks better)  Spoke again about increaseing height of his cane (he reports having done that for other canes around the house, but not yet to the one he brought in today.)      High Level Balance   High Level Balance Activities  Side stepping;Marching forwards;Marching turns;Weight-shifting turns    High Level Balance Comments  Practiced at counter, marching in place, marching  forwards with marching turns to change directions.  Practiced sidestepping at counter, with cues for large amplitude step length.  Progressed to practicing sidestep-quarter turns.  (Discussed adding these as exercises for HEP to address turns, but wife feels pt may have difficulty sequencing these and wants to continue wide BOS weightshifting turns for home.           Balance Exercises - 10/05/18 0908      Balance Exercises: Standing   SLS  Eyes open;Upper extremity support 1;10 secs;3 reps    Other Standing Exercises  Reviewed HEP including SLS as above, minisquats then to PWR! Up best posture position, then heel raises x 10 reps-needs cues for 3 second hold.       Needs cues throughout exercises to slow pace      PT Long Term Goals - 09/28/18 0902      PT LONG TERM GOAL #1   Title  Pt will perform HEP with wife's supervision for improved balance, posture, and mobility.  TARGET 10/06/18 (Delayed start TARGET 10/28/2018)    Time  4    Period  Weeks    Status  New    Target Date  10/28/18      PT LONG TERM GOAL #2   Title  Pt will improve MiniBESTest to at least 2/28 for improved balance/decreased fall risk.    Time  4    Period  Weeks    Status  New      PT LONG TERM GOAL #3   Title  Pt/wife will verbalize understanding of tips to reduce freezing episodes with gait and turns.    Time  4    Period  Weeks    Status  New            Plan - 10/05/18 2118    Clinical Impression Statement  Skilled PT session today focused on review of HEP, balance and turning activities and gait training.  Trialed rollator walker, but pt tends to keep it too far in front and leans heavily on it, with increased trunk dyskinesias noted.  Further training would be necessary if he were to use this regularly.  Worked on Agricultural engineermarching and sidestepping activities to progress to marching and quarter turn techniques, but when PT attempted to give for HEP for home, wife requests to not do that, as she  feels the previously instructed way of wide BOS weightshifting turns is most helpful for patient and she fears he would have difficulty sequening additional options for turns.    Rehab Potential  Good    PT Frequency  2x / week    PT Duration  4 weeks  plus eval   PT Treatment/Interventions  ADLs/Self Care Home Management;DME Instruction;Balance training;Therapeutic exercise;Therapeutic activities;Functional mobility training;Gait training;Neuromuscular re-education;Patient/family education    PT Next Visit Plan  Postural exercises, gait and turning strategies; ?rollator trial again at some point?    Consulted and Agree with Plan of Care  Patient    Family Member Consulted  wife, [name] [name]       Patient will benefit from skilled therapeutic intervention in order to improve the following deficits and impairments:  Abnormal gait, Decreased balance, Decreased safety awareness, Decreased mobility, Decreased strength, Difficulty walking, Postural dysfunction  Visit Diagnosis: Other abnormalities of gait and mobility  Unsteadiness on feet  Abnormal posture     Problem List Patient Active Problem List   Diagnosis Date Noted  . Encounter for therapeutic drug monitoring 01/05/2016  . Paroxysmal atrial fibrillation ([facility]) 12/24/2015  . 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 HLD (hyperlipidemia) 06/02/2015  . PD (Parkinson's disease) ([facility]) 06/02/2015  . Mitral regurgitation 03/17/2015  . CVA (cerebral infarction)   . TIA (transient ischemic attack) 03/05/2015  . Slurred speech 03/05/2015  . Anxiety 12/19/2013  . Parkinson's disease ([facility]) 08/06/2013  . Dyslipidemia 08/06/2013  . S/P mitral valve repair 08/06/2013    , W. 10/05/2018, 9:23 PM  Lonia Blood, PT 10/05/18 9:24 PM Phone: [phone] Fax: [phone]    [facility] [address] [place],  [place], [address] Phone: [phone]   Fax:  [phone]  Name: Judson Tsan MRN: [id number] Date of Birth: [birth year]

## 2018-10-05 NOTE — Therapy (Signed)
[facility] [address] [place], [place], [address] Phone: [phone]   Fax:  [phone]  Speech Language Pathology Treatment  Patient Details  Name: [name] [name] MRN: [id number] Date of Birth: 12/10/1931 Referring Provider (SLP): Dr. Jaquita Folds   Encounter Date: 10/05/2018  End of Session - 10/05/18 1021    Visit Number  3    Number of Visits  17    Date for SLP Re-Evaluation  11/27/18    SLP Start Time  0935    SLP Stop Time   1015    SLP Time Calculation (min)  40 min    Activity Tolerance  Patient tolerated treatment well       Past Medical History:  Diagnosis Date  . Dyslipidemia   . Parkinson's disease ([facility])   . S/P mitral valve replacement    mitral regurg, endocarditis  . Stroke [facility])     Past Surgical History:  Procedure Laterality Date  . CATARACT EXTRACTION  2009, 2012   right 2009, left 2012  . LOOP RECORDER IMPLANT Left 03/13/2015   Procedure: LOOP RECORDER IMPLANT;  Surgeon: Marinus Maw, MD;  Location: [facility] CATH LAB;  Service: Cardiovascular;  Laterality: Left;  . 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 TEE WITHOUT CARDIOVERSION N/A 03/13/2015   Procedure: TRANSESOPHAGEAL ECHOCARDIOGRAM (TEE);  Surgeon: Laurey Morale, MD;  Location: [facility] ENDOSCOPY;  Service: Cardiovascular;  Laterality: N/A;  . TRANSTHORACIC ECHOCARDIOGRAM  08/15/2012   EF=>55%; normal LV systolic function; RV mildly dilated & systolic function mildly reduced; RA mod dilated; mild -mod MR & mildy increased gradients; mild TR; AV mildly sclerotic with mild-mod regurg    There were no vitals filed for this visit.  Subjective Assessment - 10/05/18 0937    Subjective  "The occupational therapy worked on me balancing and those things." (pt, re: what therapy he just arrived from)    Currently in Pain?  No/denies            ADULT  SLP TREATMENT - 10/05/18 0951      General Information   Behavior/Cognition  Alert;Cooperative;Pleasant mood;Impulsive      Treatment Provided   Treatment provided  Cognitive-Linquistic      Pain Assessment   Pain Assessment  No/denies pain      Cognitive-Linquistic Treatment   Treatment focused on  Dysarthria    Skilled Treatment  Pt had not been completing loud HEY AH since eval - SLP reiterated pt must complete 5x twice daily, followed by loud oral reading to start. Pt did well enough with HEY AH that SLP was able to eliminate "hey" and just have pt vocalize "ah", with average upper 80s dB in order to recalibrate volume today; Usual min A necessary for full breath. Pt generated sentence responses with occasional min A for loudness, wiht average upper 60s dB. In free conversation of 6 minutes re: occuaption "stories" pt maintained WNL volume!       Assessment / Recommendations / Plan   Plan  Continue with current plan of care      Progression Toward Goals   Progression toward goals  Progressing toward goals       SLP Education - 10/05/18 1015    Education provided  Yes    Education Details  "ah" is sufficient for practice at home - pt must complete DAILY, BID    Person(s)  Educated  Patient    Methods  Explanation;Handout    Comprehension  Verbalized understanding       SLP Short Term Goals - 10/05/18 1004      SLP SHORT TERM GOAL #1   Title  pt will maintain loud /a/ or "HEY - Ah" average 90dB X 3 sessions    Time  3    Period  Weeks    Status  On-going      SLP SHORT TERM GOAL #2   Title  pt will demo 17/20 sentences with average low 70s dB x2 sessions    Time  3    Period  Weeks    Status  On-going      SLP SHORT TERM GOAL #3   Title  Pt will demonstrate WNL rate of speech 15/20 sentneces with occasional min A over 2 sessions    Time  3    Period  Weeks    Status  On-going      SLP SHORT TERM GOAL #4   Title  Spouse and or Pt will tell SLP 3 ways to compensate  for reduced attention    Time  3    Period  Weeks    Status  On-going       SLP Long Term Goals - 10/05/18 1004      SLP LONG TERM GOAL #1   Title  pt will demo loudness average low 70s dB  in 10 minutes simple conversation with rare visual cues x3 sessions    Time  7    Period  Weeks    Status  On-going      SLP LONG TERM GOAL #2   Title  Pt will be 95% intelligible over 8 minute simple conversation in mildly noisy environment with occasional min A over 2 sessions    Time  7    Period  Weeks    Status  On-going      SLP LONG TERM GOAL #5   Status  On-going       Plan - 10/05/18 1133    Clinical Impression Statement  SLP repeated training of HEP for dysarthria as pt had not completed HEP since eval. Spouse continues to report pt frequently unintelligible at home with family and friends, with reduced volume and rapid rate. Continue skilled ST to maximize intelligibility.     Speech Therapy Frequency  2x / week    Duration  --   8 weeks or 17 visits   Treatment/Interventions  SLP instruction and feedback;Compensatory strategies;Patient/family education;Multimodal communcation approach;Internal/external aids;Cognitive reorganization;Functional tasks;Cueing hierarchy;Environmental controls    Potential to Achieve Goals  Good    Potential Considerations  Cooperation/participation level;Ability to learn/carryover information    Consulted and Agree with Plan of Care  Patient;Family member/caregiver    Family Member Consulted  wife, [name]       Patient will benefit from skilled therapeutic intervention in order to improve the following deficits and impairments:   Dysarthria and anarthria  Attention and concentration deficit  Cognitive communication deficit  Aphasia    Problem List Patient Active Problem List   Diagnosis Date Noted  . Encounter for therapeutic drug monitoring 01/05/2016  . Paroxysmal atrial fibrillation ([facility]) 12/24/2015  . Cerebral infarction due to  embolism of left middle cerebral artery ([facility]) 06/02/2015  . HLD (hyperlipidemia) 06/02/2015  . PD (Parkinson's disease) ([facility]) 06/02/2015  . Mitral regurgitation 03/17/2015  . CVA (cerebral infarction)   . TIA (transient ischemic attack) 03/05/2015  .  Slurred speech 03/05/2015  . Anxiety 12/19/2013  . Parkinson's disease ([facility]) 08/06/2013  . Dyslipidemia 08/06/2013  . S/P mitral valve repair 08/06/2013    [facility] Health CenterCHINKE,CARL ,MS, CCC-SLP  10/05/2018, 4:03 PM  [facility] [address] [place], [place], [address] Phone: [phone]   Fax:  [phone]   Name: Cherly HensenJohn Cannell MRN: [id number] Date of Birth: 04/27/1932

## 2018-10-10 ENCOUNTER — Ambulatory Visit: Payer: Medicare Other

## 2018-10-10 ENCOUNTER — Ambulatory Visit: Payer: Medicare Other | Admitting: Physical Therapy

## 2018-10-12 ENCOUNTER — Ambulatory Visit: Payer: Medicare Other

## 2018-10-12 ENCOUNTER — Ambulatory Visit: Payer: Medicare Other | Admitting: Physical Therapy

## 2018-10-12 ENCOUNTER — Encounter: Payer: Self-pay | Admitting: Physical Therapy

## 2018-10-12 DIAGNOSIS — R41841 Cognitive communication deficit: Secondary | ICD-10-CM

## 2018-10-12 DIAGNOSIS — R293 Abnormal posture: Secondary | ICD-10-CM

## 2018-10-12 DIAGNOSIS — R2681 Unsteadiness on feet: Secondary | ICD-10-CM

## 2018-10-12 DIAGNOSIS — R471 Dysarthria and anarthria: Secondary | ICD-10-CM | POA: Diagnosis not present

## 2018-10-12 DIAGNOSIS — R2689 Other abnormalities of gait and mobility: Secondary | ICD-10-CM

## 2018-10-12 DIAGNOSIS — R41840 Attention and concentration deficit: Secondary | ICD-10-CM

## 2018-10-12 NOTE — Therapy (Signed)
[facility] [address] [place], [place], [address] Phone: [phone]   Fax:  [phone]  Speech Language Pathology Treatment  Patient Details  Name: [name] [name] MRN: [id number] Date of Birth: [birth year] Referring Provider (SLP): Dr. Jaquita Folds   Encounter Date: 10/12/2018  End of Session - 10/12/18 1103    Visit Number  4    Number of Visits  17    Date for SLP Re-Evaluation  11/27/18    SLP Start Time  1020    SLP Stop Time   1100    SLP Time Calculation (min)  40 min    Activity Tolerance  Patient tolerated treatment well       Past Medical History:  Diagnosis Date  . Dyslipidemia   . Parkinson's disease (HCC)   . S/P mitral valve replacement    mitral regurg, endocarditis  . Stroke [facility])     Past Surgical History:  Procedure Laterality Date  . CATARACT EXTRACTION  2009, 2012   right 2009, left 2012  . LOOP RECORDER IMPLANT Left 03/13/2015   Procedure: LOOP RECORDER IMPLANT;  Surgeon: Marinus Maw, MD;  Location: [facility] CATH LAB;  Service: Cardiovascular;  Laterality: Left;  . 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 TEE WITHOUT CARDIOVERSION N/A 03/13/2015   Procedure: TRANSESOPHAGEAL ECHOCARDIOGRAM (TEE);  Surgeon: Laurey Morale, MD;  Location: [facility] ENDOSCOPY;  Service: Cardiovascular;  Laterality: N/A;  . TRANSTHORACIC ECHOCARDIOGRAM  08/15/2012   EF=>55%; normal LV systolic function; RV mildly dilated & systolic function mildly reduced; RA mod dilated; mild -mod MR & mildy increased gradients; mild TR; AV mildly sclerotic with mild-mod regurg    There were no vitals filed for this visit.  Subjective Assessment - 10/12/18 1026    Subjective  "I won't be able to do the loud sounds today, Baldo Ash." (pt with oral surgery last week)    Currently in Pain?  No/denies            ADULT SLP TREATMENT - 10/12/18  1032      General Information   Behavior/Cognition  Alert;Cooperative;Pleasant mood;Impulsive      Treatment Provided   Treatment provided  Cognitive-Linquistic      Cognitive-Linquistic Treatment   Treatment focused on  Dysarthria    Skilled Treatment  Pt has still been primarily noncompliant with loud "AH" since eval (approx 4 weeks ago). Pt did not bring sentences with him. SLP again reiterated pt must complete "AH" 5x twice daily, Average upper 80s - lower 90s with consistent cues for full breath. SLP worked with pt for loudness in sentence level responses with average upper 60s dB with significant dyskinesia making measurement difficult at times. Abdominal breathing also difficult to monitor with pt dyskinesia. Free conversation of 7 1/2 minutes re: occuaptional and family "stories" pt maintained volume of average upper 60s-lower 70s.       Assessment / Recommendations / Plan   Plan  Continue with current plan of care      Progression Toward Goals   Progression toward goals  Progressing toward goals       SLP Education - 10/12/18 1103    Education provided  Yes    Education Details  need to do loud "ah" for success in therapy    Person(s) Educated  Patient    Methods  Explanation;Handout    Comprehension  Verbalized understanding  SLP Short Term Goals - 10/12/18 1111      SLP SHORT TERM GOAL #1   Title  pt will maintain loud /a/ or "HEY - Ah" average 90dB X 3 sessions    Time  2    Period  Weeks    Status  On-going      SLP SHORT TERM GOAL #2   Title  pt will demo 17/20 sentences with average low 70s dB x2 sessions    Time  2    Period  Weeks    Status  On-going      SLP SHORT TERM GOAL #3   Title  Pt will demonstrate WNL rate of speech 15/20 sentneces with occasional min A over 2 sessions    Time  2    Period  Weeks    Status  On-going      SLP SHORT TERM GOAL #4   Title  Spouse and or Pt will tell SLP 3 ways to compensate for reduced attention    Time  2     Period  Weeks    Status  On-going       SLP Long Term Goals - 10/12/18 1111      SLP LONG TERM GOAL #1   Title  pt will demo loudness average low 70s dB  in 10 minutes simple conversation with rare visual cues x3 sessions    Time  6    Period  Weeks    Status  On-going      SLP LONG TERM GOAL #2   Title  Pt will be 95% intelligible over 8 minute simple conversation in mildly noisy environment with occasional min A over 2 sessions    Time  6    Period  Weeks    Status  On-going      SLP LONG TERM GOAL #5   Status  On-going       Plan - 10/12/18 1104    Clinical Impression Statement  SLP repeated training of HEP for dysarthria as pt had not completed HEP since eval. Strangely, pt continues to use loud, almost-WNL speech in sessions in free conversation wihtout performing loud /a/ at home. Continue skilled ST to maximize intelligibility. Consider moving sessions out of ST rooms to ensure pt not conditioned to ST room.     Speech Therapy Frequency  2x / week    Duration  --   8 weeks/17 sessions   Treatment/Interventions  SLP instruction and feedback;Compensatory strategies;Patient/family education;Multimodal communcation approach;Internal/external aids;Cognitive reorganization;Functional tasks;Cueing hierarchy;Environmental controls    Potential to Achieve Goals  Good    Potential Considerations  Cooperation/participation level;Ability to learn/carryover information       Patient will benefit from skilled therapeutic intervention in order to improve the following deficits and impairments:   Dysarthria and anarthria  Attention and concentration deficit  Cognitive communication deficit    Problem List Patient Active Problem List   Diagnosis Date Noted  . Encounter for therapeutic drug monitoring 01/05/2016  . Paroxysmal atrial fibrillation (HCC) 12/24/2015  . 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 HLD (hyperlipidemia)  06/02/2015  . PD (Parkinson's disease) (HCC) 06/02/2015  . Mitral regurgitation 03/17/2015  . CVA (cerebral infarction)   . TIA (transient ischemic attack) 03/05/2015  . Slurred speech 03/05/2015  . Anxiety 12/19/2013  . Parkinson's disease (HCC) 08/06/2013  . Dyslipidemia 08/06/2013  . S/P mitral valve repair 08/06/2013    SCHINKE,CARL ,MS, CCC-SLP  10/12/2018,  11:12 AM  [facility] [address] [place], [place], [address] Phone: [phone]   Fax:  [phone]   Name: [name] HensenJohn [name] MRN: [id number] Date of Birth: 02/10/1932

## 2018-10-12 NOTE — Therapy (Signed)
[facility] [address] [place], [place], [address] Phone: [phone]   Fax:  [phone]  Physical Therapy Treatment  Patient Details  Name: [name] [name] MRN: [id number] Date of Birth: 1932-04-14 Referring Provider (PT): Dr. Rubin Payor   Encounter Date: 10/12/2018  PT End of Session - 10/12/18 2113    Visit Number  4    Number of Visits  9   New POC 09/05/18   Date for PT Re-Evaluation  11/04/18    Authorization Type  [employer] Medicare    Authorization Time Period  09/05/18 to 11/04/18    PT Start Time  0938    PT Stop Time  1016    PT Time Calculation (min)  38 min    Activity Tolerance  Patient tolerated treatment well    Behavior During Therapy  [facility] for tasks assessed/performed       Past Medical History:  Diagnosis Date  . Dyslipidemia   . Parkinson's disease (HCC)   . S/P mitral valve replacement    mitral regurg, endocarditis  . Stroke [facility])     Past Surgical History:  Procedure Laterality Date  . CATARACT EXTRACTION  2009, 2012   right 2009, left 2012  . LOOP RECORDER IMPLANT Left 03/13/2015   Procedure: LOOP RECORDER IMPLANT;  Surgeon: Marinus Maw, MD;  Location: [facility] CATH LAB;  Service: Cardiovascular;  Laterality: Left;  . 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 TEE WITHOUT CARDIOVERSION N/A 03/13/2015   Procedure: TRANSESOPHAGEAL ECHOCARDIOGRAM (TEE);  Surgeon: Laurey Morale, MD;  Location: [facility] ENDOSCOPY;  Service: Cardiovascular;  Laterality: N/A;  . TRANSTHORACIC ECHOCARDIOGRAM  08/15/2012   EF=>55%; normal LV systolic function; RV mildly dilated & systolic function mildly reduced; RA mod dilated; mild -mod MR & mildy increased gradients; mild TR; AV mildly sclerotic with mild-mod regurg    There were no vitals filed for this visit.  Subjective Assessment - 10/12/18 0940    Subjective  Had some tooth work  done, and had a very hard time with the swelling, but no pain.  No falls.    Patient is accompained by:  Family member   wife, [name]   Patient Stated Goals  When asked about goals for therapy:  "I'm not sure, I feel pretty good about things."  Wife reports posture and lessening stutter-stepping.    Currently in Pain?  No/denies    Pain Onset  More than a month ago                       [facility] Adult PT Treatment/Exercise - 10/12/18 0001      Ambulation/Gait   Ambulation/Gait  Yes    Ambulation/Gait Assistance  5: Supervision    Ambulation/Gait Assistance Details  Gait with turns, gait with change of directions    Ambulation Distance (Feet)  400 Feet   then 320, then at least 200 ft with turns, change directions   Assistive device  Straight cane    Gait Pattern  Step-through pattern;Decreased arm swing - right;Decreased arm swing - left;Decreased step length - left;Decreased step length - right;Trunk flexed;Right flexed knee in stance;Left flexed knee in stance    Ambulation Surface  Level;Indoor    Gait Comments  Additional 60 ft, then 80 ft with specific postural cues for upright posture with gait.      Posture/Postural Control  Posture/Postural Control  Postural limitations    Postural Limitations  Forward head;Rounded Shoulders;Flexed trunk;Posterior pelvic tilt    Posture Comments  Standing at doorframe, postural exercises:  scapular retraction x 10 reps, cervical retraction x 10 reps.  Then standing "tall as the wall" at doorframe, 5 reps, 10 seconds each rep, for improved upright posture.  Used this exercise as a reference for more upright posture with gait.       Exercises   Exercises  Other Exercises          Balance Exercises - 10/12/18 0944      Balance Exercises: Standing   Sit to Stand Time  x 10 reps, 5 second from mat; then 5 reps standing on blue Airex from mat; then 5 reps from 16" chair; sit<>stand from 16" BOSU cushion surface with scooting  forward and back x 5 reps        PT Education - 10/12/18 2110    Education Details  Postural exercises added to HEP    Person(s) Educated  Patient    Methods  Explanation;Demonstration;Handout    Comprehension  Verbalized understanding;Returned demonstration          PT Long Term Goals - 09/28/18 0902      PT LONG TERM GOAL #1   Title  Pt will perform HEP with wife's supervision for improved balance, posture, and mobility.  TARGET 10/06/18 (Delayed start TARGET 10/28/2018)    Time  4    Period  Weeks    Status  New    Target Date  10/28/18      PT LONG TERM GOAL #2   Title  Pt will improve MiniBESTest to at least 2/28 for improved balance/decreased fall risk.    Time  4    Period  Weeks    Status  New      PT LONG TERM GOAL #3   Title  Pt/wife will verbalize understanding of tips to reduce freezing episodes with gait and turns.    Time  4    Period  Weeks    Status  New            Plan - 10/12/18 2114    Clinical Impression Statement  Skilled PT session today focused on transfer training for functional strengthening, postural strengthening and gait training.  Pt responds very well to cues for postural strengthening and carryover into awareness of improved posture with gait.  Pt will continue to benefit from skilled PT to address balance, posture and gait.      Rehab Potential  Good    PT Frequency  2x / week    PT Duration  4 weeks   plus eval   PT Treatment/Interventions  ADLs/Self Care Home Management;DME Instruction;Balance training;Therapeutic exercise;Therapeutic activities;Functional mobility training;Gait training;Neuromuscular re-education;Patient/family education    PT Next Visit Plan  Review postural exercises, gait and turning strategies; prepare for discharge next week    Consulted and Agree with Plan of Care  Patient    Family Member Consulted  wife, [name]       Patient will benefit from skilled therapeutic intervention in order to improve the  following deficits and impairments:  Abnormal gait, Decreased balance, Decreased safety awareness, Decreased mobility, Decreased strength, Difficulty walking, Postural dysfunction  Visit Diagnosis: Unsteadiness on feet  Abnormal posture  Other abnormalities of gait and mobility     Problem List Patient Active Problem List   Diagnosis Date Noted  . Encounter for therapeutic drug monitoring 01/05/2016  .  Paroxysmal atrial fibrillation (HCC) 12/24/2015  . 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 HLD (hyperlipidemia) 06/02/2015  . PD (Parkinson's disease) (HCC) 06/02/2015  . Mitral regurgitation 03/17/2015  . CVA (cerebral infarction)   . TIA (transient ischemic attack) 03/05/2015  . Slurred speech 03/05/2015  . Anxiety 12/19/2013  . Parkinson's disease (HCC) 08/06/2013  . Dyslipidemia 08/06/2013  . S/P mitral valve repair 08/06/2013    Ashly Goethe W. 10/12/2018, 9:30 PM  Gean MaidensMARRIOTT,Yoni Lobos W., PT    [facility] [address] [place], [place], [address] Phone: [phone]   Fax:  [phone]  Name: [name] [name] MRN: [id number] Date of Birth: 09/01/1932

## 2018-10-12 NOTE — Patient Instructions (Signed)
  Please complete the assigned speech therapy homework prior to your next session and return it to the speech therapist at your next visit.  

## 2018-10-12 NOTE — Patient Instructions (Addendum)
Stand at the doorframe and try to stand "As tall as the wall"  Scapular Retraction (Standing)    With arms at sides, pinch shoulder blades together.  Hold this position with shoulders and head back towards doorframe, 3 x 15 seconds.    Do __3__ sessions per day.  Axial Extension ([place])

## 2018-10-16 ENCOUNTER — Ambulatory Visit: Payer: Medicare Other | Admitting: Speech Pathology

## 2018-10-16 ENCOUNTER — Encounter: Payer: Self-pay | Admitting: Speech Pathology

## 2018-10-16 ENCOUNTER — Ambulatory Visit: Payer: Medicare Other | Admitting: Physical Therapy

## 2018-10-16 ENCOUNTER — Encounter: Payer: Self-pay | Admitting: Physical Therapy

## 2018-10-16 DIAGNOSIS — R471 Dysarthria and anarthria: Secondary | ICD-10-CM

## 2018-10-16 DIAGNOSIS — R293 Abnormal posture: Secondary | ICD-10-CM

## 2018-10-16 DIAGNOSIS — R2681 Unsteadiness on feet: Secondary | ICD-10-CM

## 2018-10-16 DIAGNOSIS — R2689 Other abnormalities of gait and mobility: Secondary | ICD-10-CM

## 2018-10-16 DIAGNOSIS — R41841 Cognitive communication deficit: Secondary | ICD-10-CM

## 2018-10-16 NOTE — Therapy (Signed)
[facility] [address] [place], [place], [address] Phone: [phone]   Fax:  [phone]  Speech Language Pathology Treatment  Patient Details  Name: [name] [name] MRN: [id number] Date of Birth: [birth year] Referring Provider (SLP): Dr. Jaquita Folds   Encounter Date: 10/16/2018  End of Session - 10/16/18 1226    Visit Number  5    Number of Visits  17    Date for SLP Re-Evaluation  11/27/18    SLP Start Time  0846    SLP Stop Time   0928    SLP Time Calculation (min)  42 min    Activity Tolerance  Patient tolerated treatment well       Past Medical History:  Diagnosis Date  . Dyslipidemia   . Parkinson's disease (HCC)   . S/P mitral valve replacement    mitral regurg, endocarditis  . Stroke [facility])     Past Surgical History:  Procedure Laterality Date  . CATARACT EXTRACTION  2009, 2012   right 2009, left 2012  . LOOP RECORDER IMPLANT Left 03/13/2015   Procedure: LOOP RECORDER IMPLANT;  Surgeon: Marinus Maw, MD;  Location: [facility] CATH LAB;  Service: Cardiovascular;  Laterality: Left;  . 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 TEE WITHOUT CARDIOVERSION N/A 03/13/2015   Procedure: TRANSESOPHAGEAL ECHOCARDIOGRAM (TEE);  Surgeon: Laurey Morale, MD;  Location: [facility] ENDOSCOPY;  Service: Cardiovascular;  Laterality: N/A;  . TRANSTHORACIC ECHOCARDIOGRAM  08/15/2012   EF=>55%; normal LV systolic function; RV mildly dilated & systolic function mildly reduced; RA mod dilated; mild -mod MR & mildy increased gradients; mild TR; AV mildly sclerotic with mild-mod regurg    There were no vitals filed for this visit.  Subjective Assessment - 10/16/18 0856    Subjective  "I didn't do my AH's because I've had dental work"    Currently in Pain?  No/denies            ADULT SLP TREATMENT - 10/16/18 0856      General Information   Behavior/Cognition  Alert;Cooperative;Pleasant mood;Impulsive      Treatment Provided   Treatment provided  Cognitive-Linquistic      Cognitive-Linquistic Treatment   Treatment focused on  Dysarthria    Skilled Treatment  Loud AH to recalibrate volume average of 86dB cue for abdominal breathing.  Due to dyskinesias, difficult to accurately measure volume and abdominal breathing. Provided visual cues for pause and breath places in reading tasks - instructed pt to do this  at home with his own reading materials. In moderately complex conversation, pt required occasional min to mod visual and verbal cues for rate and volume  and breath support. Generate phrases to practice for Thanksgiving company to facilitate intelligilble conversation at the table on Thanksgiving      Assessment / Recommendations / Plan   Plan  Continue with current plan of care      Progression Toward Goals   Progression toward goals  Progressing toward goals       SLP Education - 10/16/18 1222    Education provided  Yes    Education Details  use visual reminders to pause and breathe, practice personal phrases after loud AH and loud reading    Person(s) Educated  Patient    Methods  Explanation;Demonstration;Handout    Comprehension  Verbalized understanding       SLP Short Term Goals -  10/16/18 1225      SLP SHORT TERM GOAL #1   Title  pt will maintain loud /a/ or "HEY - Ah" average 90dB X 3 sessions    Time  1    Period  Weeks    Status  On-going      SLP SHORT TERM GOAL #2   Title  pt will demo 17/20 sentences with average low 70s dB x2 sessions    Baseline  10/16/18    Time  1    Period  Weeks    Status  On-going      SLP SHORT TERM GOAL #3   Title  Pt will demonstrate WNL rate of speech 15/20 sentneces with occasional min A over 2 sessions    Baseline  10/16/18    Time  1    Period  Weeks    Status  On-going      SLP SHORT TERM GOAL #4   Title  Spouse and or Pt will tell SLP 3 ways to compensate  for reduced attention    Time  1    Period  Weeks    Status  On-going       SLP Long Term Goals - 10/16/18 1226      SLP LONG TERM GOAL #1   Title  pt will demo loudness average low 70s dB  in 10 minutes simple conversation with rare visual cues x3 sessions    Time  5    Period  Weeks    Status  On-going      SLP LONG TERM GOAL #2   Title  Pt will be 95% intelligible over 8 minute simple conversation in mildly noisy environment with occasional min A over 2 sessions    Baseline  10/16/18;     Time  5    Period  Weeks    Status  On-going      SLP LONG TERM GOAL #5   Status  On-going       Plan - 10/16/18 1223    Clinical Impression Statement  Pt with average of 68dB in conversation in mildly noisy environment. He requires cues for breath support to reduce rate of speech to assist with intelligibility. Continue skilled ST to maximize intelligiblity and reduce caregiver burden.    Speech Therapy Frequency  2x / week    Duration  --   8 weeks/17 sessions   Treatment/Interventions  SLP instruction and feedback;Compensatory strategies;Patient/family education;Multimodal communcation approach;Internal/external aids;Cognitive reorganization;Functional tasks;Cueing hierarchy;Environmental controls    Potential to Achieve Goals  Good    Potential Considerations  Cooperation/participation level;Ability to learn/carryover information       Patient will benefit from skilled therapeutic intervention in order to improve the following deficits and impairments:   Dysarthria and anarthria  Cognitive communication deficit    Problem List Patient Active Problem List   Diagnosis Date Noted  . Encounter for therapeutic drug monitoring 01/05/2016  . Paroxysmal atrial fibrillation (HCC) 12/24/2015  . 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 HLD (hyperlipidemia) 06/02/2015  . PD (Parkinson's disease) (HCC) 06/02/2015  . Mitral regurgitation  03/17/2015  . CVA (cerebral infarction)   . TIA (transient ischemic attack) 03/05/2015  . Slurred speech 03/05/2015  . Anxiety 12/19/2013  . Parkinson's disease (HCC) 08/06/2013  . Dyslipidemia 08/06/2013  . S/P mitral valve repair 08/06/2013    Ellizabeth Dacruz, Radene JourneyLaura Ann MS, CCC-SLP 10/16/2018, 12:27 PM  [facility] [id number]  [address] [place], [place], [address] Phone: [phone]   Fax:  [phone]   Name: Fallou Hulbert MRN: [id number] Date of Birth: March 13, 1932

## 2018-10-16 NOTE — Patient Instructions (Signed)
    Try to pay attention to when you are out of breath - you will talk faster to try and push your words out because you are out of breath  When you are asked to slow down or get louder - take a big breath  Highlight the periods and commas - take a breath and remember to slow up - even when you are reading your Bible or books, newspapers or magazines as a cue to pause and breathe  If you practice reading with a fast rate, that is not helping you habituate slower rate and pausing  Practice doesn't make perfect, perfect practice makes perfect  Bring glasses to ST  Practice:  Where are you ladies from?  What are you studying?  What holidays are different in [place]?  Do you have any siblings?   What are your plans for Christmas?

## 2018-10-17 ENCOUNTER — Ambulatory Visit: Payer: Medicare Other | Admitting: Physical Therapy

## 2018-10-17 NOTE — Therapy (Signed)
[place] [address] [place], [place], [address] Phone: [phone]   Fax:  [phone]  Physical Therapy Treatment  Patient Details  Name: [name] [name] MRN: [id number] Date of Birth: 14-May-1932 Referring Provider (PT): Dr. Linus Mako   Encounter Date: 10/16/2018  PT End of Session - 10/17/18 1236    Visit Number  5    Number of Visits  9   New POC 09/05/18   Date for PT Re-Evaluation  11/04/18    Authorization Type  [employer] Medicare    Authorization Time Period  09/05/18 to 11/04/18    PT Start Time  0806    PT Stop Time  0847    PT Time Calculation (min)  41 min    Activity Tolerance  Patient tolerated treatment well    Behavior During Therapy  [facility] for tasks assessed/performed       Past Medical History:  Diagnosis Date  . Dyslipidemia   . Parkinson's disease ([place])   . S/P mitral valve replacement    mitral regurg, endocarditis  . Stroke [facility])     Past Surgical History:  Procedure Laterality Date  . CATARACT EXTRACTION  2009, 2012   right 2009, left 2012  . LOOP RECORDER IMPLANT Left 03/13/2015   Procedure: LOOP RECORDER IMPLANT;  Surgeon: Evans Lance, MD;  Location: [facility] CATH LAB;  Service: Cardiovascular;  Laterality: Left;  . MITRAL VALVE ANNULOPLASTY  04/10/2007    75m Edwards ring; r/t h/o MR and flail mitral leaflets due to endocarditis: MRI safe  . TEE WITHOUT CARDIOVERSION N/A 03/13/2015   Procedure: TRANSESOPHAGEAL ECHOCARDIOGRAM (TEE);  Surgeon: DLarey Dresser MD;  Location[place] [facility]  Service: Cardiovascular;  Laterality: N/A;  . TRANSTHORACIC ECHOCARDIOGRAM  08/15/2012   EF=>55%; normal LV systolic function; RV mildly dilated & systolic function mildly reduced; RA mod dilated; mild -mod MR & mildy increased gradients; mild TR; AV mildly sclerotic with mild-mod regurg    There were no vitals filed for this visit.  Subjective Assessment - 10/16/18 0809    Subjective  Been trying to do the  posture exercises.    Patient is accompained by:  Family member   wife, SColletta [place]  Patient Stated Goals  When asked about goals for therapy:  "I'm not sure, I feel pretty good about things."  Wife reports posture and lessening stutter-stepping.    Currently in Pain?  No/denies    Pain Onset  --                       [facility] Adult PT Treatment/Exercise - 10/17/18 0001      Transfers   Transfers  Sit to Stand;Stand to Sit    Sit to Stand  5: Supervision    Five time sit to stand comments   12.69    Stand to Sit  5: Supervision      Ambulation/Gait   Ambulation/Gait  Yes    Ambulation/Gait Assistance  5: Supervision    Ambulation/Gait Assistance Details  Gait with turns, gait with changes of direction, with pt needing cues to slow pace for improved foot clearance during turns.    Ambulation Distance (Feet)  200 Feet   x 2, then 60 ft x 4 reps, 30 ft x 2 reps (turns)   Assistive device  Straight cane;None    Gait Pattern  Step-through pattern;Decreased arm swing - right;Decreased arm swing - left;Decreased step length - left;Decreased step length - right;Trunk flexed;Right  flexed knee in stance;Left flexed knee in stance    Ambulation Surface  Level;Indoor      Posture/Postural Control   Posture/Postural Control  Postural limitations    Postural Limitations  Forward head;Rounded Shoulders;Flexed trunk;Posterior pelvic tilt    Posture Comments  Reviewed standing postural exercises given last visit, with pt return demo understanding.      Standardized Balance Assessment   Standardized Balance Assessment  Timed Up and Go Test      Timed Up and Go Test   TUG  Normal TUG;Cognitive TUG    Normal TUG (seconds)  11.56    Cognitive TUG (seconds)  14.75      High Level Balance   High Level Balance Comments  Reviewed sit<>stand with upright posture, heel raises, then SLS, as part of HEP-pt return demo understanding.  MiniBESTest:  25/28       Mini-BESTest: Balance  Evaluation Systems Test  2005-2013 [employer]. All rights reserved. ________________________________________________________________________________________Anticipatory_________Subscore___5__/6 1. SIT TO STAND Instruction: "Cross your arms across your chest. Try not to use your hands unless you must.Do not let your legs lean against the back of the chair when you stand. Please stand up now." X(2) Normal: Comes to stand without use of hands and stabilizes independently. (1) Moderate: Comes to stand WITH use of hands on first attempt. (0) Severe: Unable to stand up from chair without assistance, OR needs several attempts with use of hands. 2. RISE TO TOES Instruction: "Place your feet shoulder width apart. Place your hands on your hips. Try to rise as high as you can onto your toes. I will count out loud to 3 seconds. Try to hold this pose for at least 3 seconds. Look straight ahead. Rise now." X(2) Normal: Stable for 3 s with maximum height. (1) Moderate: Heels up, but not full range (smaller than when holding hands), OR noticeable instability for 3 s. (0) Severe: < 3 s. 3. STAND ON ONE LEG Instruction: "Look straight ahead. Keep your hands on your hips. Lift your leg off of the ground behind you without touching or resting your raised leg upon your other standing leg. Stay standing on one leg as long as you can. Look straight ahead. Lift now." Left: Time in Seconds Trial 1:__2___Trial 2:___1.47__ (2) Normal: 20 s. X(1) Moderate: < 20 s. (0) Severe: Unable. Right: Time in Seconds Trial 1:___5.41__Trial 2:__2.69___ (2) Normal: 20 s. X(1) Moderate: < 20 s. (0) Severe: Unable To score each side separately use the trial with the longest time. To calculate the sub-score and total score use the side [left or right] with the lowest numerical score [i.e. the worse side]. ______________________________________________________________________________________Reactive  Postural Control___________Subscore:___6__/6 4. COMPENSATORY STEPPING CORRECTION- FORWARD Instruction: "Stand with your feet shoulder width apart, arms at your sides. Lean forward against my hands beyond your forward limits. When I let go, do whatever is necessary, including taking a step, to avoid a fall." X(2) Normal: Recovers independently with a single, large step (second realignment step is allowed). (1) Moderate: More than one step used to recover equilibrium. (0) Severe: No step, OR would fall if not caught, OR falls spontaneously. 5. COMPENSATORY STEPPING CORRECTION- BACKWARD Instruction: "Stand with your feet shoulder width apart, arms at your sides. Lean backward against my hands beyond your backward limits. When I let go, do whatever is necessary, including taking a step, to avoid a fall." X(2) Normal: Recovers independently with a single, large step. (1) Moderate: More than one step used to recover equilibrium. (  0) Severe: No step, OR would fall if not caught, OR falls spontaneously. 6. COMPENSATORY STEPPING CORRECTION- LATERAL Instruction: "Stand with your feet together, arms down at your sides. Lean into my hand beyond your sideways limit. When I let go, do whatever is necessary, including taking a step, to avoid a fall." Left X(2) Normal: Recovers independently with 1 step (crossover or lateral OK). (1) Moderate: Several steps to recover equilibrium. (0) Severe: Falls, or cannot step. Right X(2) Normal: Recovers independently with 1 step (crossover or lateral OK). (1) Moderate: Several steps to recover equilibrium. (0) Severe: Falls, or cannot step. Use the side with the lowest score to calculate sub-score and total score. ____________________________________________________________________________________Sensory Orientation_____________Subscore:_______6__/6 7. STANCE (FEET TOGETHER); EYES OPEN, FIRM SURFACE Instruction: "Place your hands on your hips. Place your feet  together until almost touching. Look straight ahead. Be as stable and still as possible, until I say stop." Time in seconds:________ X(2) Normal: 30 s. (1) Moderate: < 30 s. (0) Severe: Unable. 8. STANCE (FEET TOGETHER); EYES CLOSED, FOAM SURFACE Instruction: "Step onto the foam. Place your hands on your hips. Place your feet together until almost touching. Be as stable and still as possible, until I say stop. I will start timing when you close your eyes." Time in seconds:________ X(2) Normal: 30 s. (1) Moderate: < 30 s. (0) Severe: Unable. 9. INCLINE- EYES CLOSED Instruction: "Step onto the incline ramp. Please stand on the incline ramp with your toes toward the top. Place your feet shoulder width apart and have your arms down at your sides. I will start timing when you close your eyes." Time in seconds:________ X(2) Normal: Stands independently 30 s and aligns with gravity. (1) Moderate: Stands independently <30 s OR aligns with surface. (0) Severe: Unable. _________________________________________________________________________________________Dynamic Gait ______Subscore______8__/10 10. CHANGE IN GAIT SPEED Instruction: "Begin walking at your normal speed, when I tell you 'fast', walk as fast as you can. When I say 'slow', walk very slowly." X(2) Normal: Significantly changes walking speed without imbalance. (1) Moderate: Unable to change walking speed or signs of imbalance. (0) Severe: Unable to achieve significant change in walking speed AND signs of imbalance. [place] - HORIZONTAL Instruction: "Begin walking at your normal speed, when I say "right", turn your head and look to the right. When I say "left" turn your head and look to the left. Try to keep yourself walking in a straight line." (2) Normal: performs head turns with no change in gait speed and good balance. X(1) Moderate: performs head turns with reduction in gait speed. (0) Severe: performs head turns  with imbalance. 12. WALK WITH PIVOT TURNS Instruction: "Begin walking at your normal speed. When I tell you to 'turn and stop', turn as quickly as you can, face the opposite direction, and stop. After the turn, your feet should be close together." X(2) Normal: Turns with feet close FAST (< 3 steps) with good balance. (1) Moderate: Turns with feet close SLOW (>4 steps) with good balance. (0) Severe: Cannot turn with feet close at any speed without imbalance. 13. STEP OVER OBSTACLES Instruction: "Begin walking at your normal speed. When you get to the box, step over it, not around it and keep walking." X(2) Normal: Able to step over box with minimal change of gait speed and with good balance. (1) Moderate: Steps over box but touches box OR displays cautious behavior by slowing gait. (0) Severe: Unable to step over box OR steps around box. 14. TIMED UP & GO WITH  DUAL TASK [3 METER WALK] Instruction TUG: "When I say 'Go', stand up from chair, walk at your normal speed across the tape on the floor, turn around, and come back to sit in the chair." Instruction TUG with Dual Task: "Count backwards by threes starting at ___. When I say 'Go', stand up from chair, walk at your normal speed across the tape on the floor, turn around, and come back to sit in the chair. Continue counting backwards the entire time." TUG: ____11.56____seconds; Dual Task TUG: ____14.75____seconds (2) Normal: No noticeable change in sitting, standing or walking while backward counting when compared to TUG without Dual Task. X(1) Moderate: Dual Task affects either counting OR walking (>10%) when compared to the TUG without Dual Task. (0) Severe: Stops counting while walking OR stops walking while counting. When scoring item 14, if subject's gait speed slows more than 10% between the TUG without and with a Dual Task the score should be decreased by a point. TOTAL SCORE: _____25___/28       PT Education - 10/17/18 1236     Education Details  POC, plans for d/c this visit, plans for return eval in 6-9 months    Person(s) Educated  Patient;Spouse    Methods  Explanation    Comprehension  Verbalized understanding          PT Long Term Goals - 10/16/18 0810      PT LONG TERM GOAL #1   Title  Pt will perform HEP with wife's supervision for improved balance, posture, and mobility.  TARGET 10/06/18 (Delayed start TARGET 10/28/2018)    Time  4    Period  Weeks    Status  Achieved      PT LONG TERM GOAL #2   Title  Pt will improve MiniBESTest to at least 24/28 for improved balance/decreased fall risk.    Baseline  25/28 10/16/18    Time  4    Period  Weeks    Status  Achieved      PT LONG TERM GOAL #3   Title  Pt/wife will verbalize understanding of tips to reduce freezing episodes with gait and turns.    Time  4    Period  Weeks    Status  Achieved            Plan - 10/17/18 1237    Clinical Impression Statement  Assessed LTGs this session, with pt meeting 3 of 3 LTGs.  Pt and wife verbalize/demo understanding of HEP to address posture, transfers, balance, and ways to reduce festinating with gait and turns.  Wife is aware that she will likely need to continue cueing patient for optimal strategy carryover to reduce festination.  Pt is appropriate for d/c from PT at this time.    Rehab Potential  Good    PT Frequency  2x / week    PT Duration  4 weeks   plus eval   PT Treatment/Interventions  ADLs/Self Care Home Management;DME Instruction;Balance training;Therapeutic exercise;Therapeutic activities;Functional mobility training;Gait training;Neuromuscular re-education;Patient/family education    PT Next Visit Plan  DC PT this visit.  Plan for return PT eval in 6-9 months.     Consulted and Agree with Plan of Care  Patient;Family member/caregiver    Family Member Consulted  wife, Colletta [place]       Patient will benefit from skilled therapeutic intervention in order to improve the following deficits  and impairments:  Abnormal gait, Decreased balance, Decreased safety awareness, Decreased mobility, Decreased strength, Difficulty walking,  Postural dysfunction  Visit Diagnosis: Abnormal posture  Unsteadiness on feet  Other abnormalities of gait and mobility     Problem List Patient Active Problem List   Diagnosis Date Noted  . Encounter for therapeutic drug monitoring 01/05/2016  . Paroxysmal atrial fibrillation ([place]) 12/24/2015  . Cerebral infarction due to embolism of left middle cerebral artery ([place]) 06/02/2015  . HLD (hyperlipidemia) 06/02/2015  . PD (Parkinson's disease) ([place]) 06/02/2015  . Mitral regurgitation 03/17/2015  . CVA (cerebral infarction)   . TIA (transient ischemic attack) 03/05/2015  . Slurred speech 03/05/2015  . Anxiety 12/19/2013  . Parkinson's disease ([place]) 08/06/2013  . Dyslipidemia 08/06/2013  . S/P mitral valve repair 08/06/2013    Tamya Denardo W. 10/17/2018, 12:40 PM  Frazier Butt., PT   [place] [address] [place], [place], [address] Phone: [phone]   Fax:  [phone]  Name: [name] [name] MRN: [id number] Date of Birth: Aug 12, 1932   PHYSICAL THERAPY DISCHARGE SUMMARY  Visits from Start of Care: 5  Current functional level related to goals / functional outcomes: PT Long Term Goals - 10/16/18 0810      PT LONG TERM GOAL #1   Title  Pt will perform HEP with wife's supervision for improved balance, posture, and mobility.  TARGET 10/06/18 (Delayed start TARGET 10/28/2018)    Time  4    Period  Weeks    Status  Achieved      PT LONG TERM GOAL #2   Title  Pt will improve MiniBESTest to at least 24/28 for improved balance/decreased fall risk.    Baseline  25/28 10/16/18    Time  4    Period  Weeks    Status  Achieved      PT LONG TERM GOAL #3   Title  Pt/wife will verbalize understanding of tips to reduce freezing episodes with gait and turns.    Time  4    Period   Weeks    Status  Achieved      Pt has met all LTGs; TUG score 11.56 sec, 14.75 sec TUG cognitive, 5x sit<>stand 12.69 sec   Remaining deficits: Festination with gait and turns at times; posture   Education / Equipment: HEP, fall prevention and tips to reduce freezing episodes with gait and turns.  Plan: Patient agrees to discharge.  Patient goals were met. Patient is being discharged due to meeting the stated rehab goals.  ?????Recommend return PT eval in 6 months (pt and wife in agreement) due to progressive nature of disease process.  (Wife may be able to assist in f/u with referral from MD).    Mady Haagensen, PT 10/17/18 12:45 PM Phone: [phone] Fax: [phone]

## 2018-10-23 ENCOUNTER — Ambulatory Visit (INDEPENDENT_AMBULATORY_CARE_PROVIDER_SITE_OTHER): Payer: Medicare Other | Admitting: Pharmacist Clinician (PhC)/ Clinical Pharmacy Specialist

## 2018-10-23 DIAGNOSIS — I63412 Cerebral infarction due to embolism of left middle cerebral artery: Secondary | ICD-10-CM | POA: Diagnosis not present

## 2018-10-23 DIAGNOSIS — Z5181 Encounter for therapeutic drug level monitoring: Secondary | ICD-10-CM

## 2018-10-23 DIAGNOSIS — G459 Transient cerebral ischemic attack, unspecified: Secondary | ICD-10-CM

## 2018-10-23 DIAGNOSIS — I480 Paroxysmal atrial fibrillation: Secondary | ICD-10-CM | POA: Diagnosis not present

## 2018-10-23 DIAGNOSIS — G458 Other transient cerebral ischemic attacks and related syndromes: Secondary | ICD-10-CM | POA: Diagnosis not present

## 2018-10-23 DIAGNOSIS — Z9889 Other specified postprocedural states: Secondary | ICD-10-CM

## 2018-10-23 LAB — POCT INR: INR: 3.2 — AB (ref 2.0–3.0)

## 2018-10-23 NOTE — Patient Instructions (Signed)
Description   Decrease dose to 1/2 tablet daily except 1 tablet each Friday.  Repeat INR in

## 2018-11-14 ENCOUNTER — Ambulatory Visit (INDEPENDENT_AMBULATORY_CARE_PROVIDER_SITE_OTHER): Payer: Medicare Other | Admitting: Pharmacist

## 2018-11-14 DIAGNOSIS — I63412 Cerebral infarction due to embolism of left middle cerebral artery: Secondary | ICD-10-CM

## 2018-11-14 DIAGNOSIS — Z5181 Encounter for therapeutic drug level monitoring: Secondary | ICD-10-CM | POA: Diagnosis not present

## 2018-11-14 DIAGNOSIS — Z9889 Other specified postprocedural states: Secondary | ICD-10-CM

## 2018-11-14 DIAGNOSIS — G458 Other transient cerebral ischemic attacks and related syndromes: Secondary | ICD-10-CM | POA: Diagnosis not present

## 2018-11-14 DIAGNOSIS — G459 Transient cerebral ischemic attack, unspecified: Secondary | ICD-10-CM

## 2018-11-14 DIAGNOSIS — I480 Paroxysmal atrial fibrillation: Secondary | ICD-10-CM | POA: Diagnosis not present

## 2018-11-14 LAB — POCT INR: INR: 2.5 (ref 2.0–3.0)

## 2018-12-11 ENCOUNTER — Encounter: Payer: Self-pay | Admitting: Internal Medicine

## 2018-12-11 ENCOUNTER — Ambulatory Visit: Payer: Medicare Other | Admitting: Internal Medicine

## 2018-12-11 ENCOUNTER — Ambulatory Visit (INDEPENDENT_AMBULATORY_CARE_PROVIDER_SITE_OTHER): Payer: Medicare Other | Admitting: Pharmacist

## 2018-12-11 VITALS — BP 102/70 | HR 72 | Ht 68.0 in | Wt 184.6 lb

## 2018-12-11 DIAGNOSIS — I480 Paroxysmal atrial fibrillation: Secondary | ICD-10-CM

## 2018-12-11 DIAGNOSIS — Z9889 Other specified postprocedural states: Secondary | ICD-10-CM

## 2018-12-11 DIAGNOSIS — E782 Mixed hyperlipidemia: Secondary | ICD-10-CM

## 2018-12-11 DIAGNOSIS — G458 Other transient cerebral ischemic attacks and related syndromes: Secondary | ICD-10-CM

## 2018-12-11 DIAGNOSIS — Z8673 Personal history of transient ischemic attack (TIA), and cerebral infarction without residual deficits: Secondary | ICD-10-CM

## 2018-12-11 DIAGNOSIS — Z5181 Encounter for therapeutic drug level monitoring: Secondary | ICD-10-CM

## 2018-12-11 DIAGNOSIS — I63412 Cerebral infarction due to embolism of left middle cerebral artery: Secondary | ICD-10-CM | POA: Diagnosis not present

## 2018-12-11 DIAGNOSIS — G459 Transient cerebral ischemic attack, unspecified: Secondary | ICD-10-CM

## 2018-12-11 LAB — POCT INR: INR: 2.4 (ref 2.0–3.0)

## 2018-12-11 NOTE — Progress Notes (Signed)
OFFICE NOTE  Chief Complaint:  Routine follow-up  Primary Care Physician: Rodrigo RanPerini, Mark, MD  HPI:  [name] [name] is a 85 y.o. male who I had previously followed for a number years up until 2014 with [facility], then he transition to other cardiologist.  Recently [facility] cardi[facility] and he is now reestablishing care with me.  He has a past medial history significant for MR and a flail P2 mitral leaflet due to endocarditis in 2008. He had mitral valve repair and a 26-mm Edwards annuloplasty ring. Unfortunately, he has Parkinson disease and has been on medicines recently for that.  He also has a history of paroxysmal atrial fibrillation and had a cryptogenic stroke.  An implanted loop recorder was placed and he has been maintained on warfarin.  Recently the loop recorder lost battery life and he elected not to have it explanted.  He is asymptomatic with A. fib.  Actually most of his symptoms are related to his Parkinson's disease which seems to be progressing.  He has had some falls but no falls including any head injuries.  Last echo was in 2016 which showed a stable mitral valve repair and normal LV function.  Recent labs reviewed from his PCP showed a total cholesterol 174, triglycerides 91, HDL 53 and LDL 103, creatinine of 1.2.  PMHx:  Past Medical History:  Diagnosis Date  . Dyslipidemia   . Parkinson's disease ([facility])   . S/P mitral valve replacement    mitral regurg, endocarditis  . Stroke [facility](HCC)     Past Surgical History:  Procedure Laterality Date  . CATARACT EXTRACTION  2009, 2012   right 2009, left 2012  . LOOP RECORDER IMPLANT Left 03/13/2015   Procedure: LOOP RECORDER IMPLANT;  Surgeon: Marinus MawGregg W Taylor, MD;  [facility];  Service: Cardiovascular;  Laterality: Left;  . 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 TEE WITHOUT  CARDIOVERSION N/A 03/13/2015   Procedure: TRANSESOPHAGEAL ECHOCARDIOGRAM (TEE);  Surgeon: Laurey Moralealton S McLean, MD;  [facility];  Service: Cardiovascular;  Laterality: N/A;  . TRANSTHORACIC ECHOCARDIOGRAM  08/15/2012   EF=>55%; normal LV systolic function; RV mildly dilated & systolic function mildly reduced; RA mod dilated; mild -mod MR & mildy increased gradients; mild TR; AV mildly sclerotic with mild-mod regurg    FAMHx:  Family History  Problem Relation Age of Onset  . Stroke Mother        cerebral hemorrahge  . Heart attack Mother   . Prostate cancer Father   . Heart attack Father   . Hypertension Brother   . Valvular heart disease Brother     SOCHx:   reports that he has never smoked. He has never used smokeless tobacco. He reports current alcohol use of about 2.0 standard drinks of alcohol per week. He reports that he does not use drugs.  ALLERGIES:  Allergies  Allergen Reactions  . Ace Inhibitors Cough  . Demerol [Meperidine] Other (See Comments)    Parkinsons disease  . Poison Ivy Extract     Unknown reaction    ROS: Pertinent items noted in HPI and remainder of comprehensive ROS otherwise negative.  HOME MEDS: Current Outpatient Medications on File Prior to Visit  Medication Sig Dispense Refill  . acetaminophen (TYLENOL) 325 MG tablet Take 325 mg by mouth every 6 (six) hours as needed.    .Marland Kitchen  amantadine (SYMMETREL) 100 MG capsule Take 100 mg by mouth 2 (two) times daily.    Marland Kitchen atorvastatin (LIPITOR) 20 MG tablet Take 20 mg by mouth daily at 6 PM.     . cholecalciferol (VITAMIN D) 1000 UNITS tablet Take 1,000 Units by mouth daily.    Tery Sanfilippo Calcium (STOOL SOFTENER PO) Take 1 capsule by mouth daily as needed (constipation).     Marland Kitchen donepezil (ARICEPT) 10 MG tablet Take 1 tablet by mouth at bedtime.    Marland Kitchen escitalopram (LEXAPRO) 10 MG tablet Take 10 mg by mouth daily.    Marland Kitchen ibandronate (BONIVA) 150 MG tablet Take 1 tablet by mouth every 30 (thirty) days.    .  Multiple Vitamins-Minerals (MULTIVITAMIN PO) Take 1 tablet by mouth daily.     . rasagiline (AZILECT) 1 MG TABS tablet Take 1 mg by mouth every morning. .    . rotigotine (NEUPRO) 4 MG/24HR Place 1 patch onto the skin daily.    Marland Kitchen RYTARY 48.75-195 MG CPCR Take 9 capsules by mouth See admin instructions. 3 caps in the morning with Azelect, 3 caps at 1pm with Amantadine, 2 caps at 6pm with Amantadine and 1 cap at bedtime.    . vitamin B-12 (CYANOCOBALAMIN) 1000 MCG tablet Take 1,000 mcg by mouth as directed.     . vitamin E 400 UNIT capsule Take 400 Units by mouth daily.    Marland Kitchen warfarin (COUMADIN) 5 MG tablet TAKE 1/2 to 1 tablet daily as directed by coumadin clinic 90 tablet 1  . amoxicillin (AMOXIL) 500 MG tablet Take 1 tablet (500 mg total) by mouth 2 (two) times daily. (Patient not taking: Reported on 12/11/2018) 8 tablet 3   No current facility-administered medications on file prior to visit.     LABS/IMAGING: No results found for this or any previous visit (from the past 48 hour(s)). No results found.  LIPID PANEL:    Component Value Date/Time   CHOL 152 06/25/2015 1352   TRIG 129.0 06/25/2015 1352   HDL 49.50 06/25/2015 1352   CHOLHDL 3 06/25/2015 1352   VLDL 25.8 06/25/2015 1352   LDLCALC 77 06/25/2015 1352     WEIGHTS: Wt Readings from Last 3 Encounters:  04/13/18 187 lb 12.8 oz (85.2 kg)  12/29/17 184 lb 9.6 oz (83.7 kg)  03/08/17 178 lb 12.8 oz (81.1 kg)    VITALS: There were no vitals taken for this visit.  EXAM: General appearance: alert and no distress Neck: no carotid bruit, no JVD and thyroid not enlarged, symmetric, no tenderness/mass/nodules Lungs: clear to auscultation bilaterally Heart: regular rate and rhythm, S1, S2 normal and systolic murmur: early systolic 2/6, blowing at apex Abdomen: soft, non-tender; bowel sounds normal; no masses,  no organomegaly Extremities: extremities normal, atraumatic, no cyanosis or edema Pulses: 2+ and symmetric Skin: Skin  color, texture, turgor normal. No rashes or lesions Neurologic: Mental status: Alert, oriented, thought content appropriate, Rest tremor noted Psych: Pleasant  EKG: Sinus rhythm first-degree AV block, nonspecific ST changes- personally reviewed  ASSESSMENT: 1. History of endocarditis with mitral valve annuloplasty repair 2008 2. PAF-CHADSVASC score of 6 3. Chronically anticoagulated on warfarin 4. History of stroke/TIA 5. Parkinson's disease 6. Hyperlipidemia  PLAN: 1.   [name] [name] seems fairly stable from a cardiac standpoint.  His last echo was in 2016.  We will repeat that study is not long overdue to ensure stability of his mitral valve repair.  He is in sinus rhythm today but does have a history of  PAF and prior stroke/TIA.  He is anticoagulated on warfarin and we will recheck his INR today.  He has had some progression of Parkinson's disease.  He has had falls and will need to continue to monitor for increased bleeding risk.  He has dyslipidemia with LDL around 100.  I think this is reasonable given no known coronary disease history.  Plan follow-up with me  Chrystie Nose, MD, Milagros Loll    [facility]  Medical Director of the [facility] Lipid [facility] &  Cardiovascular Risk Reduction Clinic Diplomate of the American Board of Clinical Lipidology Attending Cardiologist  Direct Dial: [phone]  Fax: [phone]  Website:  [email or web address] 12/11/2018, 8:18 AM

## 2018-12-11 NOTE — Patient Instructions (Signed)
Medication Instructions:  Continue current medications  If you need a refill on your cardiac medications before your next appointment, please call your pharmacy.    Testing/Procedures: Your physician has requested that you have an echocardiogram. Echocardiography is a painless test that uses sound waves to create images of your heart. It provides your doctor with information about the size and shape of your heart and how well your heart's chambers and valves are working. This procedure takes approximately one hour. There are no restrictions for this procedure. -- done at [address] - [address]rd Floor  Follow-Up: At [facility], you and your health needs are our priority.  As part of our continuing mission to provide you with exceptional heart care, we have created designated Provider Care Teams.  These Care Teams include your primary Cardiologist (physician) and Advanced Practice Providers (APPs -  Physician Assistants and Nurse Practitioners) who all work together to provide you with the care you need, when you need it. You will need a follow up appointment in 6 months.  Please call our office 2 months in advance to schedule this appointment.  You may see Dr. Rennis Golden or one of the following Advanced Practice Providers on your designated Care Team: Azalee Course, [place] . Micah Flesher, PA-C  Any Other Special Instructions Will Be Listed Below (If Applicable).

## 2018-12-19 ENCOUNTER — Ambulatory Visit (HOSPITAL_COMMUNITY): Payer: Medicare Other | Attending: Cardiology

## 2018-12-19 DIAGNOSIS — I083 Combined rheumatic disorders of mitral, aortic and tricuspid valves: Secondary | ICD-10-CM | POA: Insufficient documentation

## 2018-12-19 DIAGNOSIS — I10 Essential (primary) hypertension: Secondary | ICD-10-CM | POA: Diagnosis not present

## 2018-12-19 DIAGNOSIS — E785 Hyperlipidemia, unspecified: Secondary | ICD-10-CM | POA: Insufficient documentation

## 2018-12-19 DIAGNOSIS — Z8673 Personal history of transient ischemic attack (TIA), and cerebral infarction without residual deficits: Secondary | ICD-10-CM | POA: Diagnosis not present

## 2018-12-19 DIAGNOSIS — I4891 Unspecified atrial fibrillation: Secondary | ICD-10-CM | POA: Diagnosis not present

## 2018-12-19 DIAGNOSIS — Z9889 Other specified postprocedural states: Secondary | ICD-10-CM

## 2018-12-27 ENCOUNTER — Encounter: Payer: Self-pay | Admitting: Speech Pathology

## 2018-12-27 NOTE — Therapy (Signed)
[facility] [address] [place], [place], [address] Phone: [phone]   Fax:  [phone]  Patient Details  Name: [name] [name] MRN: [id number] Date of Birth: 1932-05-06 Referring Provider:  Dr. Eldridge Abrahams  Encounter Date: 12/27/2018   SPEECH THERAPY DISCHARGE SUMMARY  Visits from Start of Care: 5  Current functional level related to goals / functional outcomes: See goals below   Remaining deficits: Hypokinetic dysarthria; cognitive linguistic impairment   Education / Equipment: HEP for dysarthria; compensation for dysarthria Plan: Patient agrees to discharge.  Patient goals were not met. Patient is being discharged due to not returning since the last visit.  ?????         SLP Short Term Goals - 12/27/18 1308      SLP SHORT TERM GOAL #1   Title  pt will maintain loud /a/ or "HEY - Ah" average 90dB X 3 sessions    Time  1    Period  Weeks    Status  Not Met      SLP SHORT TERM GOAL #2   Title  pt will demo 17/20 sentences with average low 70s dB x2 sessions    Baseline  10/16/18    Time  1    Period  Weeks    Status  Not Met      SLP SHORT TERM GOAL #3   Title  Pt will demonstrate WNL rate of speech 15/20 sentneces with occasional min A over 2 sessions    Baseline  10/16/18    Time  1    Period  Weeks    Status  Not Met      SLP SHORT TERM GOAL #4   Title  Spouse and or Pt will tell SLP 3 ways to compensate for reduced attention    Time  1    Period  Weeks    Status  Not Met       12/27/2018 SLP Long Term Goals - 12/27/18 1308      SLP LONG TERM GOAL #1   Title  pt will demo loudness average low 70s dB  in 10 minutes simple conversation with rare visual cues x3 sessions    Time  5    Period  Weeks    Status  Not Met      SLP LONG TERM GOAL #2   Title  Pt will be 95% intelligible over 8 minute simple conversation in mildly noisy environment with occasional min A over 2 sessions    Baseline  10/16/18;     Time  5    Period  Weeks    Status  Not Met      SLP LONG TERM GOAL #3   Status        SLP LONG TERM GOAL #4   Status       SLP LONG TERM GOAL #5   Status                Lovvorn, Annye Rusk MS, [place] 12/27/2018, 1:09 PM  [facility] [address] [place], [place], [address] Phone: [phone]   Fax:  [phone]

## 2019-01-15 ENCOUNTER — Ambulatory Visit (INDEPENDENT_AMBULATORY_CARE_PROVIDER_SITE_OTHER): Payer: Medicare Other | Admitting: *Deleted

## 2019-01-15 DIAGNOSIS — Z5181 Encounter for therapeutic drug level monitoring: Secondary | ICD-10-CM | POA: Diagnosis not present

## 2019-01-15 DIAGNOSIS — Z9889 Other specified postprocedural states: Secondary | ICD-10-CM

## 2019-01-15 DIAGNOSIS — I480 Paroxysmal atrial fibrillation: Secondary | ICD-10-CM

## 2019-01-15 DIAGNOSIS — I63412 Cerebral infarction due to embolism of left middle cerebral artery: Secondary | ICD-10-CM | POA: Diagnosis not present

## 2019-01-15 DIAGNOSIS — G459 Transient cerebral ischemic attack, unspecified: Secondary | ICD-10-CM

## 2019-01-15 DIAGNOSIS — G458 Other transient cerebral ischemic attacks and related syndromes: Secondary | ICD-10-CM | POA: Diagnosis not present

## 2019-01-15 LAB — POCT INR: INR: 1.8 — AB (ref 2.0–3.0)

## 2019-01-15 NOTE — Patient Instructions (Signed)
Description   Today take 1 tablet, then continue taking 1/2 tablet daily except 1 tablet each Friday.  Repeat INR in 3 weeks

## 2019-02-05 ENCOUNTER — Other Ambulatory Visit: Payer: Self-pay

## 2019-02-05 ENCOUNTER — Ambulatory Visit (INDEPENDENT_AMBULATORY_CARE_PROVIDER_SITE_OTHER): Payer: Medicare Other | Admitting: *Deleted

## 2019-02-05 DIAGNOSIS — G459 Transient cerebral ischemic attack, unspecified: Secondary | ICD-10-CM

## 2019-02-05 DIAGNOSIS — Z5181 Encounter for therapeutic drug level monitoring: Secondary | ICD-10-CM

## 2019-02-05 DIAGNOSIS — G458 Other transient cerebral ischemic attacks and related syndromes: Secondary | ICD-10-CM | POA: Diagnosis not present

## 2019-02-05 DIAGNOSIS — I63412 Cerebral infarction due to embolism of left middle cerebral artery: Secondary | ICD-10-CM

## 2019-02-05 DIAGNOSIS — I480 Paroxysmal atrial fibrillation: Secondary | ICD-10-CM | POA: Diagnosis not present

## 2019-02-05 DIAGNOSIS — Z9889 Other specified postprocedural states: Secondary | ICD-10-CM

## 2019-02-05 LAB — POCT INR: INR: 2.4 (ref 2.0–3.0)

## 2019-02-05 NOTE — Patient Instructions (Signed)
Description   Continue taking 1/2 tablet daily except 1 tablet each Friday.  Repeat INR in 4 weeks

## 2019-03-02 ENCOUNTER — Other Ambulatory Visit: Payer: Self-pay | Admitting: Pharmacist Clinician (PhC)/ Clinical Pharmacy Specialist

## 2019-03-02 ENCOUNTER — Telehealth: Payer: Self-pay

## 2019-03-02 MED ORDER — WARFARIN SODIUM 5 MG PO TABS: ORAL_TABLET | ORAL | 1 refills | Status: DC

## 2019-03-02 NOTE — Telephone Encounter (Signed)
lmom for prescreen/drive thru 

## 2019-03-05 ENCOUNTER — Other Ambulatory Visit: Payer: Self-pay

## 2019-03-05 ENCOUNTER — Ambulatory Visit (INDEPENDENT_AMBULATORY_CARE_PROVIDER_SITE_OTHER): Payer: Medicare Other | Admitting: *Deleted

## 2019-03-05 DIAGNOSIS — I480 Paroxysmal atrial fibrillation: Secondary | ICD-10-CM | POA: Diagnosis not present

## 2019-03-05 DIAGNOSIS — Z9889 Other specified postprocedural states: Secondary | ICD-10-CM

## 2019-03-05 DIAGNOSIS — G459 Transient cerebral ischemic attack, unspecified: Secondary | ICD-10-CM

## 2019-03-05 DIAGNOSIS — I63412 Cerebral infarction due to embolism of left middle cerebral artery: Secondary | ICD-10-CM

## 2019-03-05 DIAGNOSIS — Z5181 Encounter for therapeutic drug level monitoring: Secondary | ICD-10-CM | POA: Diagnosis not present

## 2019-03-05 DIAGNOSIS — G458 Other transient cerebral ischemic attacks and related syndromes: Secondary | ICD-10-CM

## 2019-03-05 LAB — POCT INR: INR: 2.5 (ref 2.0–3.0)

## 2019-03-05 NOTE — Patient Instructions (Addendum)
Description   Spoke with pt' wife and instructed to continue taking 1/2 tablet daily except 1 tablet each Friday.  Repeat INR in 5 weeks.

## 2019-03-28 ENCOUNTER — Telehealth: Payer: Self-pay | Admitting: Internal Medicine

## 2019-03-28 NOTE — Telephone Encounter (Signed)
Follow up: ° ° ° °Patient returning your call back. Please call patient. °

## 2019-03-28 NOTE — Telephone Encounter (Signed)
done

## 2019-04-09 ENCOUNTER — Ambulatory Visit: Payer: Medicare Other | Admitting: Physical Therapy

## 2019-04-09 ENCOUNTER — Ambulatory Visit: Payer: Medicare Other

## 2019-04-09 ENCOUNTER — Encounter: Payer: Medicare Other | Admitting: Occupational Therapy

## 2019-04-10 ENCOUNTER — Telehealth: Payer: Self-pay

## 2019-04-10 NOTE — Telephone Encounter (Signed)

## 2019-04-11 ENCOUNTER — Ambulatory Visit (INDEPENDENT_AMBULATORY_CARE_PROVIDER_SITE_OTHER): Payer: Medicare Other | Admitting: Pharmacist

## 2019-04-11 ENCOUNTER — Other Ambulatory Visit: Payer: Self-pay

## 2019-04-11 DIAGNOSIS — I480 Paroxysmal atrial fibrillation: Secondary | ICD-10-CM

## 2019-04-11 DIAGNOSIS — Z9889 Other specified postprocedural states: Secondary | ICD-10-CM

## 2019-04-11 DIAGNOSIS — Z5181 Encounter for therapeutic drug level monitoring: Secondary | ICD-10-CM

## 2019-04-11 DIAGNOSIS — G459 Transient cerebral ischemic attack, unspecified: Secondary | ICD-10-CM

## 2019-04-11 DIAGNOSIS — I63412 Cerebral infarction due to embolism of left middle cerebral artery: Secondary | ICD-10-CM

## 2019-04-11 DIAGNOSIS — G458 Other transient cerebral ischemic attacks and related syndromes: Secondary | ICD-10-CM

## 2019-04-11 LAB — POCT INR: INR: 1.9 — AB (ref 2.0–3.0)

## 2019-05-03 ENCOUNTER — Telehealth: Payer: Self-pay

## 2019-05-03 NOTE — Telephone Encounter (Signed)

## 2019-05-10 ENCOUNTER — Other Ambulatory Visit: Payer: Self-pay

## 2019-05-10 ENCOUNTER — Ambulatory Visit (INDEPENDENT_AMBULATORY_CARE_PROVIDER_SITE_OTHER): Payer: Medicare Other | Admitting: Pharmacist Clinician (PhC)/ Clinical Pharmacy Specialist

## 2019-05-10 DIAGNOSIS — I63412 Cerebral infarction due to embolism of left middle cerebral artery: Secondary | ICD-10-CM | POA: Diagnosis not present

## 2019-05-10 DIAGNOSIS — I480 Paroxysmal atrial fibrillation: Secondary | ICD-10-CM

## 2019-05-10 DIAGNOSIS — G458 Other transient cerebral ischemic attacks and related syndromes: Secondary | ICD-10-CM

## 2019-05-10 DIAGNOSIS — G459 Transient cerebral ischemic attack, unspecified: Secondary | ICD-10-CM

## 2019-05-10 DIAGNOSIS — Z5181 Encounter for therapeutic drug level monitoring: Secondary | ICD-10-CM

## 2019-05-10 DIAGNOSIS — Z9889 Other specified postprocedural states: Secondary | ICD-10-CM

## 2019-05-10 LAB — POCT INR: INR: 2.2 (ref 2.0–3.0)

## 2019-06-21 ENCOUNTER — Ambulatory Visit (INDEPENDENT_AMBULATORY_CARE_PROVIDER_SITE_OTHER): Payer: Medicare Other | Admitting: Pharmacist

## 2019-06-21 ENCOUNTER — Other Ambulatory Visit: Payer: Self-pay

## 2019-06-21 DIAGNOSIS — G459 Transient cerebral ischemic attack, unspecified: Secondary | ICD-10-CM

## 2019-06-21 DIAGNOSIS — I480 Paroxysmal atrial fibrillation: Secondary | ICD-10-CM | POA: Diagnosis not present

## 2019-06-21 DIAGNOSIS — I63412 Cerebral infarction due to embolism of left middle cerebral artery: Secondary | ICD-10-CM | POA: Diagnosis not present

## 2019-06-21 DIAGNOSIS — Z9889 Other specified postprocedural states: Secondary | ICD-10-CM

## 2019-06-21 DIAGNOSIS — Z5181 Encounter for therapeutic drug level monitoring: Secondary | ICD-10-CM | POA: Diagnosis not present

## 2019-06-21 DIAGNOSIS — G458 Other transient cerebral ischemic attacks and related syndromes: Secondary | ICD-10-CM

## 2019-06-21 LAB — POCT INR: INR: 3.9 — AB (ref 2.0–3.0)

## 2019-07-12 ENCOUNTER — Telehealth: Payer: Self-pay

## 2019-07-12 NOTE — Telephone Encounter (Signed)
Returned call to patient's wife, [name] [name], to discuss appointment for next week. Wife verbalized understanding and agreed to proceed with virtual visit.  Advised her to have his vital signs ready and available for his appointment @ [phone].     Virtual Visit Pre-Appointment Phone Call  "(Name), I am calling you today to discuss your upcoming appointment. We are currently trying to limit exposure to the virus that causes COVID-19 by seeing patients at home rather than in the office."  1. "What is the BEST phone number to call the day of the visit?" - include this in appointment notes  2. "Do you have or have access to (through a family member/friend) a smartphone with video capability that we can use for your visit?" a. If yes - list this number in appt notes as "cell" (if different from BEST phone #) and list the appointment type as a VIDEO visit in appointment notes b. If no - list the appointment type as a PHONE visit in appointment notes  3. Confirm consent - "In the setting of the current Covid19 crisis, you are scheduled for a (phone or video) visit with your provider on (date) at (time).  Just as we do with many in-office visits, in order for you to participate in this visit, we must obtain consent.  If you'd like, I can send this to your mychart (if signed up) or email for you to review.  Otherwise, I can obtain your verbal consent now.  All virtual visits are billed to your insurance company just like a normal visit would be.  By agreeing to a virtual visit, we'd like you to understand that the technology does not allow for your provider to perform an examination, and thus may limit your provider's ability to fully assess your condition. If your provider identifies any concerns that need to be evaluated in person, we will make arrangements to do so.  Finally, though the technology is pretty good, we cannot assure that it will always work on either your or our end, and in the setting of a  video visit, we may have to convert it to a phone-only visit.  In either situation, we cannot ensure that we have a secure connection.  Are you willing to proceed?" STAFF: Did the patient verbally acknowledge consent to telehealth visit? Document YES/NO here: YES  4. Advise patient to be prepared - "Two hours prior to your appointment, go ahead and check your blood pressure, pulse, oxygen saturation, and your weight (if you have the equipment to check those) and write them all down. When your visit starts, your provider will ask you for this information. If you have an Apple Watch or Kardia device, please plan to have heart rate information ready on the day of your appointment. Please have a pen and paper handy nearby the day of the visit as well."  5. Give patient instructions for MyChart download to smartphone OR Doximity/Doxy.me as below if video visit (depending on what platform provider is using)  6. Inform patient they will receive a phone call 15 minutes prior to their appointment time (may be from unknown caller ID) so they should be prepared to answer    TELEPHONE CALL NOTE  [name] [name] has been deemed a candidate for a follow-up tele-health visit to limit community exposure during the Covid-19 pandemic. I spoke with the patient via phone to ensure availability of phone/video source, confirm preferred email & phone number, and discuss instructions and expectations.  I reminded [name] [name]  [name] [name] to be prepared with any vital sign and/or heart rhythm information that could potentially be obtained via home monitoring, at the time of his visit. I reminded [name] HensenJohn [name] to expect a phone call prior to his visit.  [name] [name], [name] [name], CMA 07/12/2019 11:32 AM   INSTRUCTIONS FOR DOWNLOADING THE MYCHART APP TO SMARTPHONE  - The patient must first make sure to have activated MyChart and know their login information - If Apple, go to [employer] Store and type in MyChart in the search bar and download the app. If  Android, ask patient to go to [employer] Play Store and type in PiquaMyChart in the search bar and download the app. The app is free but as with any other app downloads, their phone may require them to verify saved payment information or Apple/Android password.  - The patient will need to then log into the app with their MyChart username and password, and select [facility] as their healthcare provider to link the account. When it is time for your visit, go to the MyChart app, find appointments, and click Begin Video Visit. Be sure to Select Allow for your device to access the Microphone and Camera for your visit. You will then be connected, and your provider will be with you shortly.  **If they have any issues connecting, or need assistance please contact MyChart service desk [phone])**  **If using a computer, in order to ensure the best quality for their visit they will need to use either of the following Internet Browsers: [employer] Edge, or Google Chrome**  IF USING DOXIMITY or DOXY.ME - The patient will receive a link just prior to their visit by text.     FULL LENGTH CONSENT FOR TELE-HEALTH VISIT   I hereby voluntarily request, consent and authorize [facility] and its employed or contracted physicians, physician assistants, nurse practitioners or other licensed health care professionals (the Practitioner), to provide me with telemedicine health care services (the "Services") as deemed necessary by the treating Practitioner. I acknowledge and consent to receive the Services by the Practitioner via telemedicine. I understand that the telemedicine visit will involve communicating with the Practitioner through live audiovisual communication technology and the disclosure of certain medical information by electronic transmission. I acknowledge that I have been given the opportunity to request an in-person assessment or other available alternative prior to the telemedicine visit and am  voluntarily participating in the telemedicine visit.  I understand that I have the right to withhold or withdraw my consent to the use of telemedicine in the course of my care at any time, without affecting my right to future care or treatment, and that the Practitioner or I may terminate the telemedicine visit at any time. I understand that I have the right to inspect all information obtained and/or recorded in the course of the telemedicine visit and may receive copies of available information for a reasonable fee.  I understand that some of the potential risks of receiving the Services via telemedicine include:  Marland Kitchen. Delay or interruption in medical evaluation due to technological equipment failure or disruption; . Information transmitted may not be sufficient (e.g. poor resolution of images) to allow for appropriate medical decision making by the Practitioner; and/or  . In rare instances, security protocols could fail, causing a breach of personal health information.  Furthermore, I acknowledge that it is my responsibility to provide information about my medical history, conditions and care that is complete and accurate to the best of my ability. I acknowledge that  Practitioner's advice, recommendations, and/or decision may be based on factors not within their control, such as incomplete or inaccurate data provided by me or distortions of diagnostic images or specimens that may result from electronic transmissions. I understand that the practice of medicine is not an exact science and that Practitioner makes no warranties or guarantees regarding treatment outcomes. I acknowledge that I will receive a copy of this consent concurrently upon execution via email to the email address I last provided but may also request a printed copy by calling the office of [facility].    I understand that my insurance will be billed for this visit.   I have read or had this consent read to me. . I understand the  contents of this consent, which adequately explains the benefits and risks of the Services being provided via telemedicine.  . I have been provided ample opportunity to ask questions regarding this consent and the Services and have had my questions answered to my satisfaction. . I give my informed consent for the services to be provided through the use of telemedicine in my medical care  By participating in this telemedicine visit I agree to the above.

## 2019-07-12 NOTE — Telephone Encounter (Signed)
Follow up ° ° ° ° ° °Pts wife is returning a call ° °Please call back  °

## 2019-07-19 ENCOUNTER — Ambulatory Visit (INDEPENDENT_AMBULATORY_CARE_PROVIDER_SITE_OTHER): Payer: Medicare Other | Admitting: Pharmacist Clinician (PhC)/ Clinical Pharmacy Specialist

## 2019-07-19 ENCOUNTER — Other Ambulatory Visit: Payer: Self-pay

## 2019-07-19 ENCOUNTER — Telehealth (INDEPENDENT_AMBULATORY_CARE_PROVIDER_SITE_OTHER): Payer: Medicare Other | Admitting: Internal Medicine

## 2019-07-19 DIAGNOSIS — G20 Parkinson's disease: Secondary | ICD-10-CM

## 2019-07-19 DIAGNOSIS — E785 Hyperlipidemia, unspecified: Secondary | ICD-10-CM

## 2019-07-19 DIAGNOSIS — I480 Paroxysmal atrial fibrillation: Secondary | ICD-10-CM

## 2019-07-19 DIAGNOSIS — Z5181 Encounter for therapeutic drug level monitoring: Secondary | ICD-10-CM | POA: Diagnosis not present

## 2019-07-19 DIAGNOSIS — Z9889 Other specified postprocedural states: Secondary | ICD-10-CM

## 2019-07-19 DIAGNOSIS — G459 Transient cerebral ischemic attack, unspecified: Secondary | ICD-10-CM

## 2019-07-19 DIAGNOSIS — I63412 Cerebral infarction due to embolism of left middle cerebral artery: Secondary | ICD-10-CM

## 2019-07-19 LAB — POCT INR: INR: 3.5 — AB (ref 2.0–3.0)

## 2019-07-19 MED ORDER — APIXABAN 5 MG PO TABS: 5.0000 mg | ORAL_TABLET | Freq: Two times a day (BID) | ORAL | 5 refills | Status: DC

## 2019-07-19 NOTE — Patient Instructions (Signed)
Medication Instructions:  Dr Debara Pickett has recommended making the following medication changes: 1. STOP Warfarin 2. START Eliquis 5 mg - take 1 tablet by mouth twice daily  If you need a refill on your cardiac medications before your next appointment, please call your pharmacy.   Follow-Up: At [facility], you and your health needs are our priority.  As part of our continuing mission to provide you with exceptional heart care, we have created designated Provider Care Teams.  These Care Teams include your primary Cardiologist (physician) and Advanced Practice Providers (APPs -  Physician Assistants and Nurse Practitioners) who all work together to provide you with the care you need, when you need it. You will need a follow up appointment in 6 months.  Please call our office 2 months in advance to schedule this appointment.  You may see Pixie Casino, MD (IN OFFICE) or one of the following Advanced Practice Providers on your designated Care Team: [place], [place] . Fabian Sharp, PA-C

## 2019-07-19 NOTE — Progress Notes (Signed)
Virtual Visit via Video Note   This visit type was conducted due to national recommendations for restrictions regarding the COVID-19 Pandemic (e.g. social distancing) in an effort to limit this patient's exposure and mitigate transmission in our community.  Due to his co-morbid illnesses, this patient is at least at moderate risk for complications without adequate follow up.  This format is felt to be most appropriate for this patient at this time.  All issues noted in this document were discussed and addressed.  A limited physical exam was performed with this format.  Please refer to the patient's chart for his consent to telehealth [facility].   Evaluation Performed:  Doxy.me video visit  Date:  07/19/2019   ID:  [name] [name], DOB 11/04/1932, MRN [id number]  Patient Location:  [address] [place] [address]  Provider location:   [address], [address] [place], [place] [address]  PCP:  Rodrigo RanPerini, Mark, MD  Cardiologist:  Chrystie NoseKenneth C Autumne Kallio, MD Electrophysiologist:  None   Chief Complaint:  Parkinson's is worse  History of Present Illness:    [name] [name] is a 85 y.o. male who presents via audio/video conferencing for a telehealth visit today.  [name] [name] seen today in video follow-up.  He is accompanied by his wife.  She reports that his Parkinson's has worsened somewhat.  His legs are now a little weaker.  Recently I noted that his INRs have been labile.  He takes warfarin for atrial fibrillation and in the past for a prior history of mitral valve repair.  He had had endocarditis leading to mitral valve repair.  Based on guidelines from the AHA/ACC in 2014, A. fib associated with mitral valve repair was considered valvular AF.  Newer guidelines, however in 2019 indicate that only mitral stenosis (mild to moderate) or mitral valve replacement are the indications for warfarin anticoagulation.  He has no known coronary disease.  His wife noted that recently he has had some  shortness of breath particular walking upstairs however does not happen all the time.  He had a repeat echo in January which showed normal systolic function, moderate to severe atrial enlargement and a stable valve gradient.  The patient does not have symptoms concerning for COVID-19 infection (fever, chills, cough, or new SHORTNESS OF BREATH).    Prior CV studies:   The following studies were reviewed today:  Echo Chart reviewed  PMHx:  Past Medical History:  Diagnosis Date   Dyslipidemia    Parkinson's disease (HCC)    S/P mitral valve replacement    mitral regurg, endocarditis   Stroke [facility](HCC)     Past Surgical History:  Procedure Laterality Date   CATARACT EXTRACTION  2009, 2012   right 2009, left 2012   LOOP RECORDER IMPLANT Left 03/13/2015   Procedure: LOOP RECORDER IMPLANT;  Surgeon: Marinus MawGregg W Taylor, MD;  [facility];  Service: Cardiovascular;  Laterality: Left;   MITRAL VALVE ANNULOPLASTY  04/10/2007    26mm Edwards ring; r/t h/o MR and flail mitral leaflets due to endocarditis: MRI safe   TEE WITHOUT CARDIOVERSION N/A 03/13/2015   Procedure: TRANSESOPHAGEAL ECHOCARDIOGRAM (TEE);  Surgeon: Laurey Moralealton S McLean, MD;  [facility];  Service: Cardiovascular;  Laterality: N/A;   TRANSTHORACIC ECHOCARDIOGRAM  08/15/2012   EF=>55%; normal LV systolic function; RV mildly dilated & systolic function mildly reduced; RA mod dilated; mild -mod MR & mildy increased gradients; mild TR; AV mildly sclerotic with mild-mod regurg    FAMHx:  Family History  Problem Relation Age of Onset   Stroke Mother        cerebral hemorrahge   Heart attack Mother    Prostate cancer Father    Heart attack Father    Hypertension Brother    Valvular heart disease Brother     SOCHx:   reports that he has never smoked. He has never used smokeless tobacco. He reports current alcohol use of about 2.0 standard drinks of alcohol per week. He reports that he does not use  drugs.  ALLERGIES:  Allergies  Allergen Reactions   Ace Inhibitors Cough   Demerol [Meperidine] Other (See Comments)    Parkinsons disease   Poison Ivy Extract     Unknown reaction    MEDS:  Current Meds  Medication Sig   acetaminophen (TYLENOL) 325 MG tablet Take 325 mg by mouth every 6 (six) hours as needed.   amantadine (SYMMETREL) 100 MG capsule Take 100 mg by mouth 2 (two) times daily.   amoxicillin (AMOXIL) 500 MG tablet Take 1 tablet (500 mg total) by mouth 2 (two) times daily.   atorvastatin (LIPITOR) 20 MG tablet Take 20 mg by mouth daily at 6 PM.    cholecalciferol (VITAMIN D) 1000 UNITS tablet Take 1,000 Units by mouth daily.   Docusate Calcium (STOOL SOFTENER PO) Take 1 capsule by mouth daily as needed (constipation).    donepezil (ARICEPT) 10 MG tablet Take 1 tablet by mouth at bedtime.   escitalopram (LEXAPRO) 10 MG tablet Take 10 mg by mouth daily.   Multiple Vitamins-Minerals (MULTIVITAMIN PO) Take 1 tablet by mouth daily.    rasagiline (AZILECT) 1 MG TABS tablet Take 1 mg by mouth every morning. .   rotigotine (NEUPRO) 4 MG/24HR Place 1 patch onto the skin daily.   RYTARY 48.75-195 MG CPCR Take 9 capsules by mouth See admin instructions. 3 caps in the morning with Azelect, 3 caps at 1pm with Amantadine, 2 caps at 6pm with Amantadine and 1 cap at bedtime.   selegiline (ELDEPRYL) 5 MG tablet Take 1 tablet by mouth 2 (two) times daily.   vitamin B-12 (CYANOCOBALAMIN) 1000 MCG tablet Take 1,000 mcg by mouth daily.    vitamin E 400 UNIT capsule Take 400 Units by mouth daily.   [DISCONTINUED] warfarin (COUMADIN) 5 MG tablet TAKE 1/2 to 1 tablet daily as directed by coumadin clinic     ROS: Pertinent items noted in HPI and remainder of comprehensive ROS otherwise negative.  Labs/Other Tests and Data Reviewed:    Recent Labs: No results found for requested labs within last 8760 hours.   Recent Lipid Panel Lab Results  Component Value Date/Time    CHOL 152 06/25/2015 01:52 PM   TRIG 129.0 06/25/2015 01:52 PM   HDL 49.50 06/25/2015 01:52 PM   CHOLHDL 3 06/25/2015 01:52 PM   LDLCALC 77 06/25/2015 01:52 PM    Wt Readings from Last 3 Encounters:  07/19/19 181 lb (82.1 kg)  12/11/18 184 lb 9.6 oz (83.7 kg)  04/13/18 187 lb 12.8 oz (85.2 kg)     Exam:    Vital Signs:  BP 120/89    Pulse 81    Temp (!) 97.5 F (36.4 C)    Ht 5\' 8"  (1.727 m)    Wt 181 lb (82.1 kg)    BMI 27.52 kg/m    General appearance: alert, no distress and Significant axial head tremor Lungs: No audible respiratory difficulty Abdomen: Mildly overweight Extremities: extremities normal, atraumatic, no cyanosis or edema Skin:  Skin color, texture, turgor normal. No rashes or lesions Neurologic: Mental status: Alert, oriented, thought content appropriate, Significant Parkinson's tremor Psych: Pleasant  ASSESSMENT & PLAN:    1. History of endocarditis with mitral valve annuloplasty repair 2008 2. PAF-CHADSVASC score of 6 (nonvalvular A. fib) 3. Chronically anticoagulated on warfarin 4. History of stroke/TIA 5. Parkinson's disease 6. Hyperlipidemia  [name] [name] unfortunately has had progression of his Parkinson's disease.  He has become a little more unstable and has had labile INRs recently.  I am concerned about adverse bleeding risk.  The recently released 2019 AHA guideline updates also recommend elimination of nonvalvular terminology, and specify that only patients with moderate to severe mitral stenosis or mechanical heart valves are to be excluded from the use of DOACs.  Based on these recommendations, I think he is a good candidate to switch to Eliquis.  Apparently this is been addressed in the past and cost was an issue however his wife does seem to be in agreement with it.  Does have an INR checked today and I have messaged our pharmacist to see if they could consider switching him over to Eliquis.  Follow-up with me in 6 months.  COVID-19  Education: The signs and symptoms of COVID-19 were discussed with the patient and how to seek care for testing (follow up with PCP or arrange E-visit).  The importance of social distancing was discussed today.  Patient Risk:   After full review of this patients clinical status, I feel that they are at least moderate risk at this time.  Time:   Today, I have spent 25 minutes with the patient with telehealth technology discussing Parkinson's, dyspnea, A. fib, mitral valve disease, echo findings.     Medication Adjustments/Labs and Tests Ordered: Current medicines are reviewed at length with the patient today.  Concerns regarding medicines are outlined above.   Tests Ordered: No orders of the defined types were placed in this encounter.   Medication Changes: Meds ordered this encounter  Medications   apixaban (ELIQUIS) 5 MG TABS tablet    Sig: Take 1 tablet (5 mg total) by mouth 2 (two) times daily.    Dispense:  60 tablet    Refill:  5    Disposition:  in 6 month(s)  Chrystie Nose, MD, [place] [facility] [place], FACP  [place]   [facility]  Medical Director of the Advanced Lipid Disorders &  Cardiovascular Risk Reduction Clinic Diplomate of the American Board of Clinical Lipidology Attending Cardiologist  Direct Dial: [phone]   Fax: [phone]  Website:  [email or web address]  Chrystie Nose, MD  07/19/2019 8:46 AM

## 2019-08-15 ENCOUNTER — Encounter: Payer: Self-pay | Admitting: Adult Health

## 2019-08-15 ENCOUNTER — Ambulatory Visit (INDEPENDENT_AMBULATORY_CARE_PROVIDER_SITE_OTHER): Payer: Medicare Other | Admitting: Adult Health

## 2019-08-15 ENCOUNTER — Telehealth: Payer: Self-pay | Admitting: Internal Medicine

## 2019-08-15 ENCOUNTER — Other Ambulatory Visit (INDEPENDENT_AMBULATORY_CARE_PROVIDER_SITE_OTHER): Payer: Medicare Other

## 2019-08-15 ENCOUNTER — Other Ambulatory Visit: Payer: Self-pay

## 2019-08-15 VITALS — BP 132/82 | HR 63 | Temp 97.3°F | Ht 68.0 in | Wt 179.0 lb

## 2019-08-15 DIAGNOSIS — I480 Paroxysmal atrial fibrillation: Secondary | ICD-10-CM

## 2019-08-15 DIAGNOSIS — Z79899 Other long term (current) drug therapy: Secondary | ICD-10-CM

## 2019-08-15 DIAGNOSIS — R21 Rash and other nonspecific skin eruption: Secondary | ICD-10-CM

## 2019-08-15 DIAGNOSIS — G20 Parkinson's disease: Secondary | ICD-10-CM

## 2019-08-15 DIAGNOSIS — Z9889 Other specified postprocedural states: Secondary | ICD-10-CM | POA: Diagnosis not present

## 2019-08-15 DIAGNOSIS — T50905A Adverse effect of unspecified drugs, medicaments and biological substances, initial encounter: Secondary | ICD-10-CM

## 2019-08-15 MED ORDER — METOPROLOL SUCCINATE ER 25 MG PO TB24: 25.0000 mg | ORAL_TABLET | Freq: Every day | ORAL | 6 refills | Status: DC

## 2019-08-15 MED ORDER — WARFARIN SODIUM 5 MG PO TABS: 5.0000 mg | ORAL_TABLET | Freq: Every day | ORAL | 3 refills | Status: AC

## 2019-08-15 NOTE — Telephone Encounter (Signed)
° °  Pt c/o medication issue:  1. Name of Medication: Eliquis  2. How are you currently taking this medication (dosage and times per day)? As written  3. Are you having a reaction (difficulty breathing--STAT)? no  4. What is your medication issue? Puffy around eyes, SOB when  Moving around. States symptoms started after prescribed Eliquis

## 2019-08-15 NOTE — Telephone Encounter (Signed)
Reviewed with Dr. Sallyanne Kuster DOD.  Agree that it does not sound like allergic reaction to Eliquis.  Patient put on NP schedule for 4 pm today with Jory Sims NP

## 2019-08-15 NOTE — Patient Instructions (Signed)
Medication Instructions:  STOP- Eliquis Friday night START- Coumadin 5 mg by mouth daily START- Metoprolol 12.5 mg by mouth daily  If you need a refill on your cardiac medications before your next appointment, please call your pharmacy.  Labwork: CBC, BMP and Sed Rate on Monday HERE IN OUR OFFICE AT [facility]  You will NOT need to fast   Take the provided lab slips with you to the lab for your blood draw.   When you have your labs (blood work) drawn today and your tests are completely normal, you will receive your results only by MyChart Message (if you have MyChart) -OR-  A paper copy in the mail.  If you have any lab test that is abnormal or we need to change your treatment, we will call you to review these results.  Testing/Procedures: None Ordered  Follow-Up: . Your physician recommends that you schedule a follow-up appointment in: 1 Month   At [facility], you and your health needs are our priority.  As part of our continuing mission to provide you with exceptional heart care, we have created designated Provider Care Teams.  These Care Teams include your primary Cardiologist (physician) and Advanced Practice Providers (APPs -  Physician Assistants and Nurse Practitioners) who all work together to provide you with the care you need, when you need it.  Thank you for choosing [facility] at [facility]!!

## 2019-08-15 NOTE — Telephone Encounter (Signed)
Routed to CVRR 

## 2019-08-15 NOTE — Progress Notes (Signed)
Cardiology Office Note   Date:  08/15/2019   ID:  [name] [name], DOB 1931/11/28, MRN [id number]  PCP:  Crist Infante, MD  Cardiologist: Dr.Hilty  CC: F/U Checking Eliquis reaction   History of Present Illness: [name] [name] is a 85 y.o. male who presents for ongoing assessment and management of mitral valve repair with history of endocarditis and mitral valve angioplasty in 2008, on Eliquis, paroxysmal atrial fibrillation (nonvalvular A. fib), history of TIA, hyperlipidemia, along with history of Parkinson's disease which is followed by neurology.    On last office visit with Dr. Debara Pickett on 07/19/2019 the patient was taken off of Coumadin and placed on Eliquis.  This was changed as he was more unstable on his feet and had had a labile INRs recently.    Dr. Debara Pickett noted that 2019 AHA guidelines recommend elimination of nonvalvular terminology and specify that only patients with moderate to severe mitral stenosis or mechanical heart valves are to be excluded from use of DOAC's.   [name] [name] called our office today with complaints of swelling, and puffiness around his eyes which started after beginning Eliquis.  When seen today his tardive dyskinesia has gotten substantially worse according to his wife and the patient.  Over the last 2 weeks he has had worsening body movements unable to rest, he is also broken out in a rash, heart rate has risen, and he feels miserable.  This is been progressive, and very concerning to he and his wife.   Past Medical History:  Diagnosis Date   Dyslipidemia    Parkinson's disease ([place])    S/P mitral valve replacement    mitral regurg, endocarditis   Stroke [facility])     Past Surgical History:  Procedure Laterality Date   CATARACT EXTRACTION  2009, 2012   right 2009, left 2012   LOOP RECORDER IMPLANT Left 03/13/2015   Procedure: LOOP RECORDER IMPLANT;  Surgeon: Evans Lance, MD;  Location: [facility] CATH LAB;  Service: Cardiovascular;  Laterality: Left;   MITRAL VALVE  ANNULOPLASTY  04/10/2007    51mm Edwards ring; r/t h/o MR and flail mitral leaflets due to endocarditis: MRI safe   TEE WITHOUT CARDIOVERSION N/A 03/13/2015   Procedure: TRANSESOPHAGEAL ECHOCARDIOGRAM (TEE);  Surgeon: Larey Dresser, MD;  Location: [facility];  Service: Cardiovascular;  Laterality: N/A;   TRANSTHORACIC ECHOCARDIOGRAM  08/15/2012   EF=>55%; normal LV systolic function; RV mildly dilated & systolic function mildly reduced; RA mod dilated; mild -mod MR & mildy increased gradients; mild TR; AV mildly sclerotic with mild-mod regurg     Current Outpatient Medications  Medication Sig Dispense Refill   acetaminophen (TYLENOL) 325 MG tablet Take 325 mg by mouth every 6 (six) hours as needed.     amantadine (SYMMETREL) 100 MG capsule Take 100 mg by mouth 2 (two) times daily.     amoxicillin (AMOXIL) 500 MG tablet Take 1 tablet (500 mg total) by mouth 2 (two) times daily. 8 tablet 3   apixaban (ELIQUIS) 5 MG TABS tablet Take 1 tablet (5 mg total) by mouth 2 (two) times daily. 60 tablet 5   atorvastatin (LIPITOR) 20 MG tablet Take 20 mg by mouth daily at 6 PM.      cholecalciferol (VITAMIN D) 1000 UNITS tablet Take 1,000 Units by mouth daily.     Docusate Calcium (STOOL SOFTENER PO) Take 1 capsule by mouth daily as needed (constipation).      escitalopram (LEXAPRO) 10 MG tablet Take 10 mg by mouth daily.  Multiple Vitamins-Minerals (MULTIVITAMIN PO) Take 1 tablet by mouth daily.      rotigotine (NEUPRO) 4 MG/24HR Place 1 patch onto the skin daily.     RYTARY 48.75-195 MG CPCR Take 9 capsules by mouth See admin instructions. 3 caps in the morning with Azelect, 3 caps at 1pm with Amantadine, 2 caps at 6pm with Amantadine and 1 cap at bedtime.     selegiline (ELDEPRYL) 5 MG tablet Take 1 tablet by mouth 2 (two) times daily.     vitamin B-12 (CYANOCOBALAMIN) 1000 MCG tablet Take 1,000 mcg by mouth daily.      vitamin E 400 UNIT capsule Take 400 Units by mouth daily.      metoprolol succinate (TOPROL XL) 25 MG 24 hr tablet Take 1 tablet (25 mg total) by mouth daily. 30 tablet 6   warfarin (COUMADIN) 5 MG tablet Take 1 tablet (5 mg total) by mouth daily. 30 tablet 3   No current facility-administered medications for this visit.     Allergies:   Ace inhibitors, Demerol [meperidine], and Poison ivy extract    Social History:  The patient  reports that he has never smoked. He has never used smokeless tobacco. He reports current alcohol use of about 2.0 standard drinks of alcohol per week. He reports that he does not use drugs.   Family History:  The patient's family history includes Heart attack in his father and mother; Hypertension in his brother; Prostate cancer in his father; Stroke in his mother; Valvular heart disease in his brother.    ROS: All other systems are reviewed and negative. Unless otherwise mentioned in H&P    PHYSICAL EXAM: VS:  BP 132/82    Pulse 63    Temp (!) 97.3 F (36.3 C)    Ht 5\' 8"  (1.727 m)    Wt 179 lb (81.2 kg)    SpO2 96%    BMI 27.22 kg/m  , BMI Body mass index is 27.22 kg/m. GEN: Well nourished, well developed, in no acute distress, anxious HEENT: normal Neck: no JVD, carotid bruits, or masses Cardiac: IRRR; tachycardic no murmurs, rubs, or gallops,no edema  Respiratory:  Clear to auscultation bilaterally, normal work of breathing GI: soft, nontender, nondistended, + BS MS: no deformity or atrophy Skin: warm and dry, petechiae and inflammatory rash noted on arms, trunk, and lower extremities  Neuro:   Significant tardive dyskinesia Psych: euthymic mood, full affect   EKG: ATRIAL fibrillation heart rate of 104 bpm, artifact is also noted with dyskinesia.  Recent Labs: No results found for requested labs within last 8760 hours.    Lipid Panel    Component Value Date/Time   CHOL 152 06/25/2015 1352   TRIG 129.0 06/25/2015 1352   HDL 49.50 06/25/2015 1352   CHOLHDL 3 06/25/2015 1352   VLDL 25.8 06/25/2015 1352     LDLCALC 77 06/25/2015 1352      Wt Readings from Last 3 Encounters:  08/15/19 179 lb (81.2 kg)  07/19/19 181 lb (82.1 kg)  12/11/18 184 lb 9.6 oz (83.7 kg)      Other studies Reviewed: Echo 12/19/2018 1. The left ventricle appears to be normal in size, have mild wall thickness, with 60-65%. Echo evidence of normal in diastolic filling patterns.  2. Right ventricular systolic pressure is is mildly elevated.  3. The right ventricle is normal in size, has moderately increased wall thickness and normal systolic function.  4. Severely dilated left atrial size.  5. Moderately dilated right atrial  size.  6. A s/p repair with 33mm annuloplasty ring valve is present in the mitral position.  7. Mitral valve regurgitation is mild to moderate by color flow Doppler.  8. The mitral valve is degenerative.  9. Normal tricuspid valve. 10. Tricuspid regurgitation is mild. 11. Aortic valve tricuspid. 12. There is moderate thickening of the aortic valve. 13. No atrial level shunt detected by color flow Doppler.  ASSESSMENT AND PLAN:  1.  Inflammatory reaction to Eliquis: Worsening dyskinesia, rash, edema, and anxiousness.  I have spoken with Pharm.D., and ask them to see the patient after my exam.  I would like to stop the Eliquis as I believe this is related to the medication.  There are some case studies which have shown that dyskinesia with swelling and rash can occur with apixaban.  Once stopped these symptoms are relieved.  Xarelto did not have similar symptoms.  Our plan is to stop the Eliquis over the next 72 hours.  He will restart Coumadin at 5 mg daily to overlap in the setting of prior CVA.  He will stop Eliquis on Friday, August 17, 2019 and began taking Coumadin only on Saturday and Sunday.  He will follow-up in the Coumadin clinic on Monday, August 20, 2019 at 845 for an INR check.  The plan will be to procure a home INR monitor so that the patient will not have to come to the clinic  so often.  I will check a BMET, CBC, and sed rate.  2.  Paroxysmal atrial fib: The patient is now in atrial fib with RVR.  I believe this is related to the inflammatory process and allergic reaction to the Eliquis.  I will start him on low-dose metoprolol 12.5 mg daily which he is to take until he is stopped taking Eliquis.  I will check on him on Monday when he comes back to the office for an INR check to evaluate his heart rate and blood pressure.  3.  Parkinson's disease: The patient does have a sedative which he can takes as needed.  I have advised him to take a dose at night as well as during the day as he normally is only taking daily dose.  This may help him to rest a little better at night with the worsening dyskinesia.  I would like for him to follow-up with neurology sooner than his prescheduled appointment.  4.  Mitral valve disease status post mitral valve repair: Intolerant to Eliquis.  Changing to Coumadin.  He will follow in the Coumadin clinic.  No complaints of shortness of breath at this time.  Heart rate is not well controlled hopefully use of low-dose beta-blocker will be helpful until we see him again on Monday.  Labs/ tests ordered today include: BMET, CBC, sed rate.  I have spent 45 minutes with this patient, his wife, and in discussion with Pharm.D. with shared decision making concerning plan to wean from Eliquis begin Coumadin and have follow-up.   Bettey Mare. Liborio Nixon, ANP, AACC   08/15/2019 5:12 PM    [facility] [address] Office [phone] Fax [phone]

## 2019-08-16 ENCOUNTER — Telehealth: Payer: Self-pay

## 2019-08-16 NOTE — Telephone Encounter (Signed)
Pt's spouse called regarding questions w/the overlapping of coumadin and eliquis will route to pharmd to address

## 2019-08-16 NOTE — Telephone Encounter (Signed)
Spoke with wife.  She wanted to confirm dosing instructions and noted that they were not able to get to the pharmacy before it closed last night.  He should continue Eliquis thru Friday evening, and start warfarin 5 mg x 3 days tonight, then drop to 2.5 mg daily.  Wife voiced understanding, confirmed appointment for Monday.

## 2019-08-17 ENCOUNTER — Telehealth: Payer: Self-pay | Admitting: Adult Health

## 2019-08-17 NOTE — Telephone Encounter (Signed)
Spoke with wife and provided MD recommendations. She voiced understanding. Since initial call, patient's BP has been 127/97 and HR 56 and 126/99 and HR 78. Patient has [facility] and it read his HR 135bpm.  Of note, NP note and AVS instructions say metoprolol succinate 12.5mg  daily but Rx sent to pharmacy reads 25mg  daily so patient has been taking full tablet.   Advised to monitor BP/HR consistently over weekend and bring readings to appointment. If HF persistently >100bpm and symptomatic and or for worsening SOB, can seek ED eval if needed.

## 2019-08-17 NOTE — Telephone Encounter (Signed)
Returned call to patient's wife. She reports he is having labored breathing since yesterday. He was seen 08/15/19 d/t eliquis reaction, AF with RVR. He was started on metoprolol. He gets short of breath with minimal exertion. BP 130/80 - HR not reported. Patient has no chest pain/pressure. Explained that beta-blocker can make patient feel more fatigued and asked her to check BP and HR so we can determine if his HR is controlled and/or too low  Patient has INR check on 08/20/19 - will have BP, HR, EKG checked at same time per wife.   Will route to primary cardiologist to review and advise

## 2019-08-17 NOTE — Telephone Encounter (Signed)
Would monitor vitals on BB - looks like he has follow-up Monday- given recent concern for "reaction" to Eliquis, if he has worsening shortness of breath over the weekend, would advise medical care in the ER or urgent care.  Dr Lemmie Evens

## 2019-08-17 NOTE — Telephone Encounter (Signed)
Pt c/o Shortness Of Breath: STAT if SOB developed within the last 24 hours or pt is noticeably SOB on the phone  1. Are you currently SOB (can you hear that pt is SOB on the phone)? Patient's spouse called.   2. How long have you been experiencing SOB? Has been going on for awhile. She stated the labored breathing started yesterday some time.   3. Are you SOB when sitting or when up moving around? both  4. Are you currently experiencing any other symptoms? Patient was seen by Curt Bears on Wednesday and is due to see her again Monday.

## 2019-08-20 ENCOUNTER — Ambulatory Visit (INDEPENDENT_AMBULATORY_CARE_PROVIDER_SITE_OTHER): Payer: Medicare Other | Admitting: Pharmacist Clinician (PhC)/ Clinical Pharmacy Specialist

## 2019-08-20 ENCOUNTER — Other Ambulatory Visit: Payer: Self-pay

## 2019-08-20 DIAGNOSIS — I480 Paroxysmal atrial fibrillation: Secondary | ICD-10-CM

## 2019-08-20 DIAGNOSIS — Z7901 Long term (current) use of anticoagulants: Secondary | ICD-10-CM

## 2019-08-20 DIAGNOSIS — I4891 Unspecified atrial fibrillation: Secondary | ICD-10-CM | POA: Diagnosis not present

## 2019-08-20 LAB — POCT INR: INR: 4.6 — AB (ref 2.0–3.0)

## 2019-08-20 MED ORDER — METOPROLOL SUCCINATE 25 MG PO CS24: 12.5000 mg | EXTENDED_RELEASE_CAPSULE | Freq: Every day | ORAL | 3 refills | Status: DC

## 2019-08-20 NOTE — Assessment & Plan Note (Signed)
Pt in AF.  Was in to see APP Jory Sims) last week, was in AF with elevated HR.  Pt in office today for INR check and was asked by APP to repeat ekg.    Patient reports feeling better, muscle spasms/tardive dyskinesia much better since late Saturday.  Stopped Eliquis after Friday night dose due to worsening tardive dyskenesia, rash and ocular puffiness.  He is feeling better today, although even with low dose beta blocker, is feeling tired/fatigued.  Explained that he should feel better after about a week on medication.    EKG showed patient in AF - reviewed by both Jory Sims NP and Skeet Latch MD

## 2019-08-20 NOTE — Patient Instructions (Signed)
No warfarin Monday Sept 28 or Tuesday Sept 29.  Then take 1/2 tablet daily except 1 tablet each Wednesday.  Repeat INR in 1 week

## 2019-08-21 LAB — BASIC METABOLIC PANEL
BUN/Creatinine Ratio: 24 (ref 10–24)
BUN: 28 mg/dL — ABNORMAL HIGH (ref 8–27)
CO2: 23 mmol/L (ref 20–29)
Calcium: 9.2 mg/dL (ref 8.6–10.2)
Chloride: 105 mmol/L (ref 96–106)
Creatinine, Ser: 1.17 mg/dL (ref 0.76–1.27)
GFR calc Af Amer: 65 mL/min/{1.73_m2} (ref >59)
GFR calc non Af Amer: 57 mL/min/{1.73_m2} — ABNORMAL LOW (ref >59)
Glucose: 98 mg/dL (ref 65–99)
Potassium: 4.2 mmol/L (ref 3.5–5.2)
Sodium: 142 mmol/L (ref 134–144)

## 2019-08-21 LAB — CBC
Hematocrit: 44.4 % (ref 37.5–51.0)
Hemoglobin: 14.9 g/dL (ref 13.0–17.7)
MCH: 31.7 pg (ref 26.6–33.0)
MCHC: 33.6 g/dL (ref 31.5–35.7)
MCV: 95 fL (ref 79–97)
Platelets: 119 10*3/uL — ABNORMAL LOW (ref 150–450)
RBC: 4.7 x10E6/uL (ref 4.14–5.80)
RDW: 13.7 % (ref 11.6–15.4)
WBC: 5.4 10*3/uL (ref 3.4–10.8)

## 2019-08-21 LAB — SEDIMENTATION RATE: Sed Rate: 2 mm/hr (ref 0–30)

## 2019-08-22 ENCOUNTER — Other Ambulatory Visit (INDEPENDENT_AMBULATORY_CARE_PROVIDER_SITE_OTHER): Payer: Medicare Other

## 2019-08-22 DIAGNOSIS — I4891 Unspecified atrial fibrillation: Secondary | ICD-10-CM | POA: Diagnosis not present

## 2019-08-22 DIAGNOSIS — I480 Paroxysmal atrial fibrillation: Secondary | ICD-10-CM

## 2019-08-22 NOTE — Progress Notes (Signed)
Pt with AF, switched back to warfarin due to side effects from Eliquis.    EKG done today per request of K. Purcell Nails NP

## 2019-08-24 ENCOUNTER — Other Ambulatory Visit: Payer: Self-pay

## 2019-08-24 ENCOUNTER — Encounter: Payer: Self-pay | Admitting: Cardiology

## 2019-08-24 ENCOUNTER — Ambulatory Visit: Payer: Medicare Other | Admitting: Cardiology

## 2019-08-24 VITALS — BP 133/96 | HR 96 | Ht 68.0 in | Wt 177.6 lb

## 2019-08-24 DIAGNOSIS — R0602 Shortness of breath: Secondary | ICD-10-CM

## 2019-08-24 DIAGNOSIS — I4821 Permanent atrial fibrillation: Secondary | ICD-10-CM

## 2019-08-24 DIAGNOSIS — R600 Localized edema: Secondary | ICD-10-CM

## 2019-08-24 NOTE — Patient Instructions (Addendum)
Medication Instructions:  Your Physician recommend you continue on your current medication as directed.     If you need a refill on your cardiac medications before your next appointment, please call your pharmacy.   Lab work: Your physician recommends that you return for lab work today (BNP)  If you have labs (blood work) drawn today and your tests are completely normal, you will receive your results only by: Marland Kitchen MyChart Message (if you have MyChart) OR . A paper copy in the mail If you have any lab test that is abnormal or we need to change your treatment, we will call you to review the results.  Testing/Procedures: Your physician has requested that you have an echocardiogram. Echocardiography is a painless test that uses sound waves to create images of your heart. It provides your doctor with information about the size and shape of your heart and how well your heart's chambers and valves are working. This procedure takes approximately one hour. There are no restrictions for this procedure. [place] [address]   Follow-Up: Your physician recommends that you schedule a follow-up appointment in 2 weeks with Jory Sims, DNP

## 2019-08-24 NOTE — Progress Notes (Signed)
Cardiology Office Note:    Date:  08/24/2019   ID:  [name], DOB [birth year], MRN [id number]  PCP:  Rodrigo Ran, MD  Cardiologist:  Chrystie Nose, MD  Referring MD: Rodrigo Ran, MD   Chief Complaint  Patient presents with  . Shortness of Breath    pt SOB and fluid build up in face    History of Present Illness:    [name] [name] is a 85 y.o. male with a hx of mitral valve repair with history of endocarditis and mitral valve angioplasty in 2008, paroxysmal atrial fibrillation (nonvalvular A. Fib) now back on coumadin, history of TIA, hyperlipidemia, along with history of Parkinson's disease  who is seen for urgent doctor of the day slot visit for shortness of breath.   Today:  Feels short of breath. Notices puffiness in his face. Feels that this has been worsening, especially eye puffiness, after about 2.5 weeks of the apixaban. Was changed recently to warfarin.   Saw Joni Reining 08/15/19, the next few days following that visit he had good days, but then again this week noticed that he wasn't breathing well. No fevers/chills, no cough. No active infection, amoxicillin is only PRN before dental work.  Difficulty breathing and dyskinesia issues seem to run together. Worse with exercise/activity.   Has recently noted SOB at night, wheezes in bed or in the chair. Sleeps on his side in bed, no clear PND but doesn't sleep well at night. No LE edema, only in the face recently. Last week had mild puffiness in his hands.   Wife is very concerned as Parkinson's often goes downhill when the lungs go bad. She thinks she hears wheezing when he lays down.  Weighs himself at home. 181 lbs this AM, normal range 179-181 lbs.   Denies chest pain,  LE edema or unexpected weight gain. No syncope or palpitations.  Wife has many questions today, reviewed to the best of my ability.  Past Medical History:  Diagnosis Date  . Dyslipidemia   . Parkinson's disease (HCC)   . S/P mitral valve  replacement    mitral regurg, endocarditis  . Stroke [facility])     Past Surgical History:  Procedure Laterality Date  . CATARACT EXTRACTION  2009, 2012   right 2009, left 2012  . LOOP RECORDER IMPLANT Left 03/13/2015   Procedure: LOOP RECORDER IMPLANT;  Surgeon: Marinus Maw, MD;  Location: [facility] CATH LAB;  Service: Cardiovascular;  Laterality: Left;  . MITRAL VALVE ANNULOPLASTY  04/10/2007    21mm Edwards ring; r/t h/o MR and flail mitral leaflets due to endocarditis: MRI safe  . TEE WITHOUT CARDIOVERSION N/A 03/13/2015   Procedure: TRANSESOPHAGEAL ECHOCARDIOGRAM (TEE);  Surgeon: Laurey Morale, MD;  Location: [facility] ENDOSCOPY;  Service: Cardiovascular;  Laterality: N/A;  . TRANSTHORACIC ECHOCARDIOGRAM  08/15/2012   EF=>55%; normal LV systolic function; RV mildly dilated & systolic function mildly reduced; RA mod dilated; mild -mod MR & mildy increased gradients; mild TR; AV mildly sclerotic with mild-mod regurg    Current Medications: Current Outpatient Medications on File Prior to Visit  Medication Sig  . acetaminophen (TYLENOL) 325 MG tablet Take 325 mg by mouth every 6 (six) hours as needed.  Marland Kitchen amantadine (SYMMETREL) 100 MG capsule Take 100 mg by mouth 2 (two) times daily.  Marland Kitchen amoxicillin (AMOXIL) 500 MG tablet Take 1 tablet (500 mg total) by mouth 2 (two) times daily. (Patient taking differently: Take 500 mg by mouth 2 (two) times daily. FOR DENTAL WORK ONLY)  .  atorvastatin (LIPITOR) 20 MG tablet Take 20 mg by mouth daily at 6 PM.   . cholecalciferol (VITAMIN D) 1000 UNITS tablet Take 1,000 Units by mouth daily.  Mariane Baumgarten Calcium (STOOL SOFTENER PO) Take 1 capsule by mouth daily as needed (constipation).   Marland Kitchen donepezil (ARICEPT) 10 MG tablet Take 1 tablet by mouth at bedtime.  Marland Kitchen escitalopram (LEXAPRO) 10 MG tablet Take 10 mg by mouth daily.  . Levodopa (INBRIJA) 42 MG CAPS Use 2 capsules as needed for off periods, not to exceed 5 doses per day  . Metoprolol Succinate 25 MG CS24 Take 12.5 mg  by mouth daily.  . Multiple Vitamins-Minerals (MULTIVITAMIN PO) Take 1 tablet by mouth daily.   . rotigotine (NEUPRO) 4 MG/24HR Place 1 patch onto the skin daily.  Marland Kitchen RYTARY 48.75-195 MG CPCR Take 9 capsules by mouth See admin instructions. 3 caps in the morning with Azelect, 3 caps at 1pm with Amantadine, 2 caps at 6pm with Amantadine and 1 cap at bedtime.  . selegiline (ELDEPRYL) 5 MG tablet Take 1 tablet by mouth 2 (two) times daily.  . vitamin B-12 (CYANOCOBALAMIN) 1000 MCG tablet Take 1,000 mcg by mouth daily.   . vitamin E 400 UNIT capsule Take 400 Units by mouth daily.  Marland Kitchen warfarin (COUMADIN) 5 MG tablet Take 1 tablet (5 mg total) by mouth daily.   No current facility-administered medications on file prior to visit.      Allergies:   Apixaban, Ace inhibitors, Demerol [meperidine], and Poison ivy extract   Social History   Tobacco Use  . Smoking status: Never Smoker  . Smokeless tobacco: Never Used  Substance Use Topics  . Alcohol use: Yes    Alcohol/week: 2.0 standard drinks    Types: 1 Glasses of wine, 1 Cans of beer per week    Comment: 1 can of beer or 1 wine every day  . Drug use: No    Family History: family history includes Heart attack in his father and mother; Hypertension in his brother; Prostate cancer in his father; Stroke in his mother; Valvular heart disease in his brother.  ROS:   Please see the history of present illness.  Additional pertinent ROS: Constitutional: Negative for chills, fever, night sweats, unintentional weight loss  HENT: Negative for ear pain and hearing loss.   Eyes: Negative for loss of vision and eye pain.  Respiratory: Negative for cough, sputum.  Wife endorses possible wheezing at night Cardiovascular: See HPI. Gastrointestinal: Negative for abdominal pain, melena, and hematochezia.  Genitourinary: Negative for dysuria and hematuria.  Musculoskeletal: Negative for falls and myalgias.  Skin: Negative for itching and rash.   Neurological: Negative for focal weakness, focal sensory changes and loss of consciousness. Chronic parkinsons Endo/Heme/Allergies: Does not bruise/bleed easily.     EKGs/Labs/Other Studies Reviewed:    The following studies were reviewed today: Echo 12/19/2018 1. The left ventricle appears to be normal in size, have mild wall thickness, with 60-65%. Echo evidence of normal in diastolic filling patterns. 2. Right ventricular systolic pressure is is mildly elevated. 3. The right ventricle is normal in size, has moderately increased wall thickness and normal systolic function. 4. Severely dilated left atrial size. 5. Moderately dilated right atrial size. 6. A s/p repair with 50mm annuloplasty ring valve is present in the mitral position. 7. Mitral valve regurgitation is mild to moderate by color flow Doppler. 8. The mitral valve is degenerative. 9. Normal tricuspid valve. 10. Tricuspid regurgitation is mild. 11. Aortic valve tricuspid.  12. There is moderate thickening of the aortic valve. 13. No atrial level shunt detected by color flow Doppler.  EKG:  EKG is personally reviewed.  The ekg ordered today demonstrates atrial fibrillation  Recent Labs: 08/20/2019: BUN 28; Creatinine, Ser 1.17; Hemoglobin 14.9; Platelets 119; Potassium 4.2; Sodium 142  Recent Lipid Panel    Component Value Date/Time   CHOL 152 06/25/2015 1352   TRIG 129.0 06/25/2015 1352   HDL 49.50 06/25/2015 1352   CHOLHDL 3 06/25/2015 1352   VLDL 25.8 06/25/2015 1352   LDLCALC 77 06/25/2015 1352    Physical Exam:    VS:  BP (!) 133/96   Pulse 96   Ht  (1.727 m)   Wt 177 lb 9.6 oz (80.6 kg)   SpO2 95%   BMI 27.00 kg/m     Wt Readings from Last 3 Encounters:  08/24/19 177 lb 9.6 oz (80.6 kg)  08/15/19 179 lb (81.2 kg)  07/19/19 181 lb (82.1 kg)    GEN: Well nourished, well developed in no acute distress HEENT: Normal, moist mucous membranes NECK: JVD 9-10 cm, no HJR. CARDIAC: irregularly  irregular rhythm, normal S1 and S2, no rubs, gallops. 2/6 SEM RUSB. VASCULAR: Radial and DP pulses 2+ bilaterally. No carotid bruits RESPIRATORY:  Clear to auscultation without rales, wheezing or rhonchi. When he lays at 30 degrees he does not wheeze but has upper airway sounds in consistent pattern that do not sound pulmonary. ABDOMEN: Soft, non-tender, non-distended MUSCULOSKELETAL:  Ambulates independently SKIN: Warm and dry, trace-1+ bilateral LE edema NEUROLOGIC:  Alert and oriented x 3, Significant tremor/dyskinesia noted PSYCHIATRIC:  Normal affect    ASSESSMENT:    No diagnosis found. PLAN:    Shortness of breath: prior echo with normal systolic and diastolic function. Lungs sound clear today, but has borderline JVD, mild LE edema -will order BNP-->UPDATED, elevated -no appreciable rales in lungs on exam. Does not have wheezing on my exam, though wife endorses intermittently at home. Has patterned vocal noise at 30 degrees that is not consistent with pulmonary origin. -with mild LE/borderline JVD, will repeat echo for clinical change -wife is understandably very anxious about his breathing. Pulse ox ok today. She has pulse ox at home. Counseled on red flag warning signs, when to call office and when to present to ER.  -she will monitor breathing, pulse ox, weight.  -may need to start diuretics based on workup. Weights have been stable, so weight based dosing may not be accurate -if cardiac workup unrevealing, with neurological disorder may need PFTs. Would also consider CXR -we will follow up symptoms early next week when we call with results.  Atrial fibrillation, on coumadin: as per prior notes, ?reaction to apixaban, now back to coumadin  Plan for follow up: 2 weeks with Joni Reining  Medication Adjustments/Labs and Tests Ordered: Current medicines are reviewed at length with the patient today.  Concerns regarding medicines are outlined above.  Orders Placed This  Encounter  Procedures  . Brain natriuretic peptide  . ECHOCARDIOGRAM COMPLETE   No orders of the defined types were placed in this encounter.   Patient Instructions  Medication Instructions:  Your Physician recommend you continue on your current medication as directed.     If you need a refill on your cardiac medications before your next appointment, please call your pharmacy.   Lab work: Your physician recommends that you return for lab work today (BNP)  If you have labs (blood work) drawn today and your tests  are completely normal, you will receive your results only by: Marland Kitchen. MyChart Message (if you have MyChart) OR . A paper copy in the mail If you have any lab test that is abnormal or we need to change your treatment, we will call you to review the results.  Testing/Procedures: Your physician has requested that you have an echocardiogram. Echocardiography is a painless test that uses sound waves to create images of your heart. It provides your doctor with information about the size and shape of your heart and how well your heart's chambers and valves are working. This procedure takes approximately one hour. There are no restrictions for this procedure. [address]   Follow-Up: Your physician recommends that you schedule a follow-up appointment in 2 weeks with Joni ReiningKathryn Lawrence, DNP         Signed, Jodelle RedBridgette Iveth Heidemann, MD PhD 08/24/2019 2:16 PM     [facility]

## 2019-08-25 LAB — BRAIN NATRIURETIC PEPTIDE: BNP: 670.1 pg/mL — ABNORMAL HIGH (ref 0.0–100.0)

## 2019-08-26 ENCOUNTER — Encounter: Payer: Self-pay | Admitting: Cardiology

## 2019-08-27 ENCOUNTER — Other Ambulatory Visit: Payer: Self-pay

## 2019-08-27 ENCOUNTER — Telehealth: Payer: Self-pay

## 2019-08-27 ENCOUNTER — Ambulatory Visit (INDEPENDENT_AMBULATORY_CARE_PROVIDER_SITE_OTHER): Payer: Medicare Other | Admitting: Pharmacist

## 2019-08-27 DIAGNOSIS — I480 Paroxysmal atrial fibrillation: Secondary | ICD-10-CM

## 2019-08-27 DIAGNOSIS — Z7901 Long term (current) use of anticoagulants: Secondary | ICD-10-CM

## 2019-08-27 LAB — POCT INR: INR: 4.3 — AB (ref 2.0–3.0)

## 2019-08-27 MED ORDER — FUROSEMIDE 40 MG PO TABS: 40.0000 mg | ORAL_TABLET | Freq: Every day | ORAL | 3 refills | Status: DC | PRN

## 2019-08-27 NOTE — Telephone Encounter (Signed)
Wife updated with lab work and MD's recommendations. Wife verbalized understanding and new orders placed.   Wife also requesting if MD could prescribed PRN oxygen.Infromed wife that a message would be routed to primary cardiologist but usually there are certain requirement that are necessary in order to prescribe oxygen.

## 2019-08-27 NOTE — Telephone Encounter (Signed)
Wife updated and voiced understanding. 

## 2019-08-27 NOTE — Telephone Encounter (Signed)
May not need oxygen - BNP elevated, Dr. Harrell Gave recommended lasix - has follow-up with Leonia Reader.  Dr. Lemmie Evens

## 2019-08-30 ENCOUNTER — Telehealth: Payer: Self-pay | Admitting: Internal Medicine

## 2019-08-30 ENCOUNTER — Other Ambulatory Visit: Payer: Self-pay

## 2019-08-30 ENCOUNTER — Encounter: Payer: Self-pay | Admitting: Cardiology

## 2019-08-30 ENCOUNTER — Ambulatory Visit (HOSPITAL_COMMUNITY): Payer: Medicare Other | Attending: Cardiology

## 2019-08-30 DIAGNOSIS — R0602 Shortness of breath: Secondary | ICD-10-CM | POA: Insufficient documentation

## 2019-08-30 NOTE — Telephone Encounter (Signed)
Pt c/o medication issue:  1. Name of Medication: [place]  2. How are you currently taking this medication (dosage and times per day)?  1x daily  3. Are you having a reaction (difficulty breathing--STAT)? Difficulty breathing  4. What is your medication issue? FLUID BUILD UP?

## 2019-08-30 NOTE — Telephone Encounter (Signed)
ERROR

## 2019-08-30 NOTE — Telephone Encounter (Signed)
Spoke with pt, and wife, the patient woke from a nap SOB after having a bad dream. He was breathing heavy and his pulse was 105 but his 02 sat is 95%. He has calmed dawn now and his pulse is 45-55. He has a little swelling in his feet but not a lot. Encouraged patient to weight every morning and discussed when it is appropriate to take the furosemide. They voiced understanding and will call back with questions or concerns.

## 2019-08-31 ENCOUNTER — Telehealth: Payer: Self-pay | Admitting: Internal Medicine

## 2019-08-31 NOTE — Telephone Encounter (Signed)
Wife of patient was returning a call from [place] from this morning. She also spoke with Hilda Blades yesterday about her husband's medication.Please return a call to the wife.

## 2019-08-31 NOTE — Telephone Encounter (Signed)
Notes recorded by Buford Dresser, MD on 08/30/2019 at 9:36 PM EDT  The squeeze of the heart is abnormal--not terribly low, but less than the same test in January The mitral valve also appears to be severely leaking, where as before this was mild-moderate. This may be the cause of the shortness of breath. Should keep upcoming follow up with Jory Sims to monitor response to lasix. Will defer long term decision making on reduced EF/severe MR to Dr. Debara Pickett as it is his patient. Thanks.  Pt wife informed of providers result & recommendations. Pt verbalized understanding. No further questions . She will discuss at upcoming and states that he will "take it easy until appt"

## 2019-09-04 NOTE — Progress Notes (Signed)
Cardiology Office Note   Date:  09/05/2019   ID:  [name] [name], DOB [birth year], MRN [id number]  PCP:  [name] Infante, MD  Cardiologist: Dr. Harrell [name] CC: Follow Up   History of Present Illness: [name] [name] is a 85 y.o. male who presents for ongoing assessment and management of mitral valve disease status post mitral valve repair with history of endocarditis, mitral valve angioplasty in 2008, paroxysmal atrial fibrillation (nonvalvular A. fib), continues on Coumadin therapy, history of TIA, hyperlipidemia, history of Parkinson's disease who was seen most recently by Dr. Harrell [name] for shortness of breath.  [name] [name] had a severe allergic reaction to Eliquis causing significant tardive dyskinesia.  This was discontinued and he was restarted on Coumadin therapy.  He did follow-up with Dr. Harrell [name] on 08/27/2019 with worsening breathing status.  BNP was drawn and found to be elevated and he was recommended to be placed on diuretic therapy.  A repeat echocardiogram was also ordered.  This was completed on 08/30/2019 which revealed EF of 45% to 50% with mildly decreased function.  Mildly increased left ventricular size with no ventricular hypertrophy.  There is mild diffuse hypokinesis.  The patient is status post mitral valve repair, there appear to be severe mitral regurgitation, eccentric and posteriorly directed, the mean gradient was 7 mmHg but MVA was normal by PHT.  It was felt that the elevated mean gradient was due to high flow for mitral regurgitation rather than from significant mitral stenosis.  The patient's wife called our office on 08/31/2019 as he was having some shortness of breath after awakening from a nap and having a bad dream.  The patient was to continue his Lasix daily to assist with fluid retention and PND.  He has taken his Lasix daily, and has had good response.  He is now on 20 mg daily.  There is no evidence of fluid overload on exam.  His wife does worry about him being over  diuresed.  I have been in contact with [name] [name], his primary cardiologist, concerning his echocardiogram results.  [name] [name] would like to schedule a TEE for him on September 14, 2019 to better evaluate mitral valve and discuss other options for treatment with this patient.  I have discussed this with the patient and his wife who verbalized understanding and are willing to proceed with this.    Past Medical History:  Diagnosis Date   Dyslipidemia    Parkinson's disease ([place])    S/P mitral valve replacement    mitral regurg, endocarditis   Stroke [facility])     Past Surgical History:  Procedure Laterality Date   CATARACT EXTRACTION  2009, 2012   right 2009, left 2012   LOOP RECORDER IMPLANT Left 03/13/2015   Procedure: LOOP RECORDER IMPLANT;  Surgeon: [name] Lance, MD;  Location: [facility] CATH LAB;  Service: Cardiovascular;  Laterality: Left;   MITRAL VALVE ANNULOPLASTY  04/10/2007    33mm Edwards ring; r/t h/o MR and flail mitral leaflets due to endocarditis: MRI safe   TEE WITHOUT CARDIOVERSION N/A 03/13/2015   Procedure: TRANSESOPHAGEAL ECHOCARDIOGRAM (TEE);  Surgeon: [name] Dresser, MD;  Location: [facility];  Service: Cardiovascular;  Laterality: N/A;   TRANSTHORACIC ECHOCARDIOGRAM  08/15/2012   EF=>55%; normal LV systolic function; RV mildly dilated & systolic function mildly reduced; RA mod dilated; mild -mod MR & mildy increased gradients; mild TR; AV mildly sclerotic with mild-mod regurg     Current Outpatient Medications  Medication Sig Dispense Refill   acetaminophen (TYLENOL) 325  MG tablet Take 325 mg by mouth every 6 (six) hours as needed.     amantadine (SYMMETREL) 100 MG capsule Take 100 mg by mouth 2 (two) times daily.     amoxicillin (AMOXIL) 500 MG tablet Take 1 tablet (500 mg total) by mouth 2 (two) times daily. (Patient taking differently: Take 500 mg by mouth 2 (two) times daily. FOR DENTAL WORK ONLY) 8 tablet 3   atorvastatin (LIPITOR) 20 MG tablet Take 20 mg  by mouth daily at 6 PM.      cholecalciferol (VITAMIN D) 1000 UNITS tablet Take 1,000 Units by mouth daily.     Docusate Calcium (STOOL SOFTENER PO) Take 1 capsule by mouth daily as needed (constipation).      donepezil (ARICEPT) 10 MG tablet Take 1 tablet by mouth at bedtime.     escitalopram (LEXAPRO) 10 MG tablet Take 10 mg by mouth daily.     furosemide (LASIX) 40 MG tablet Take 1 tablet (40 mg total) by mouth daily as needed. 90 tablet 3   Levodopa (INBRIJA) 42 MG CAPS Use 2 capsules as needed for off periods, not to exceed 5 doses per day     Metoprolol Succinate 25 MG CS24 Take 12.5 mg by mouth daily. 45 capsule 3   Multiple Vitamins-Minerals (MULTIVITAMIN PO) Take 1 tablet by mouth daily.      rotigotine (NEUPRO) 4 MG/24HR Place 1 patch onto the skin daily.     RYTARY 48.75-195 MG CPCR Take 9 capsules by mouth See admin instructions. 3 caps in the morning with Azelect, 3 caps at 1pm with Amantadine, 2 caps at 6pm with Amantadine and 1 cap at bedtime.     selegiline (ELDEPRYL) 5 MG tablet Take 1 tablet by mouth 2 (two) times daily.     vitamin B-12 (CYANOCOBALAMIN) 1000 MCG tablet Take 1,000 mcg by mouth daily.      vitamin E 400 UNIT capsule Take 400 Units by mouth daily.     warfarin (COUMADIN) 5 MG tablet Take 1 tablet (5 mg total) by mouth daily. 30 tablet 3   No current facility-administered medications for this visit.     Allergies:   Apixaban, Ace inhibitors, Demerol [meperidine], and Poison ivy extract    Social History:  The patient  reports that he has never smoked. He has never used smokeless tobacco. He reports current alcohol use of about 2.0 standard drinks of alcohol per week. He reports that he does not use drugs.   Family History:  The patient's family history includes Heart attack in his father and mother; Hypertension in his brother; Prostate cancer in his father; Stroke in his mother; Valvular heart disease in his brother.    ROS: All other systems  are reviewed and negative. Unless otherwise mentioned in H&P    PHYSICAL EXAM: VS:  BP 119/75    Pulse 91    Ht  (1.727 m)    Wt 169 lb 12.8 oz (77 kg)    SpO2 96%    BMI 25.82 kg/m  , BMI Body mass index is 25.82 kg/m. GEN: Well nourished, well developed, in no acute distress HEENT: normal Neck: no JVD, carotid bruits, or masses Cardiac IRRR; no murmurs, rubs, or gallops,no edema  Respiratory:  Clear to auscultation bilaterally, normal work of breathing GI: soft, nontender, nondistended, + BS MS: no deformity or atrophy Skin: warm and dry, no rash Neuro:  Strength and sensation are intact, tardive dyskinesia is noted although mild Psych: euthymic mood,  full affect   EKG: Not completed this office visit.  Recent Labs: 08/20/2019: BUN 28; Creatinine, Ser 1.17; Hemoglobin 14.9; Platelets 119; Potassium 4.2; Sodium 142 08/24/2019: BNP 670.1    Lipid Panel    Component Value Date/Time   CHOL 152 06/25/2015 1352   TRIG 129.0 06/25/2015 1352   HDL 49.50 06/25/2015 1352   CHOLHDL 3 06/25/2015 1352   VLDL 25.8 06/25/2015 1352   LDLCALC 77 06/25/2015 1352      Wt Readings from Last 3 Encounters:  09/05/19 169 lb 12.8 oz (77 kg)  08/24/19 177 lb 9.6 oz (80.6 kg)  08/15/19 179 lb (81.2 kg)      Other studies Reviewed: 1. Left ventricular ejection fraction, by visual estimation, is 45 to 50%. The left ventricle has mildly decreased function. Mildly increased left ventricular size. There is no left ventricular hypertrophy. Mild diffuse hypokinesis.  2. Left ventricular diastolic Doppler parameters are indeterminate pattern of LV diastolic filling.  3. Global right ventricle has moderately reduced systolic function.The right ventricular size is mildly enlarged. No increase in right ventricular wall thickness.  4. The patient is status post mitral valve repair. There appears to be severe mitral regurgitation, eccentric and posteriorly-directed. The mean gradient was 7 mmHg but  MVA was normal by PHT, I think that the elevated mean gradient is due to high flow  from mitral regurgitation rather than significant mitral stenosis.  5. The aortic valve is tricuspid Aortic valve regurgitation is mild by color flow Doppler. Mild aortic valve sclerosis without stenosis.  6. There is mild dilatation of the aortic root measuring 39 mm.  7. The tricuspid valve is normal in structure. Tricuspid valve regurgitation moderate.  8. Left atrial size was severely dilated.  9. Right atrial size was severely dilated. 10. The IVC was not visualized. Peak RV-RA gradient 28 mmHg.  Echo 12/19/2018 1. The left ventricle appears to be normal in size, have mild wall thickness, with 60-65%. Echo evidence of normal in diastolic filling patterns. 2. Right ventricular systolic pressure is is mildly elevated. 3. The right ventricle is normal in size, has moderately increased wall thickness and normal systolic function. 4. Severely dilated left atrial size. 5. Moderately dilated right atrial size. 6. A s/p repair with 26mm annuloplasty ring valve is present in the mitral position. 7. Mitral valve regurgitation is mild to moderate by color flow Doppler. 8. The mitral valve is degenerative. 9. Normal tricuspid valve. 10. Tricuspid regurgitation is mild. 11. Aortic valve tricuspid. 12. There is moderate thickening of the aortic valve. 13. No atrial level shunt detected by color flow Doppler.  ASSESSMENT AND PLAN:  1.  Chronic systolic dysfunction: EF mildly reduced at 45% to 50%.  He will continue on metoprolol 12.5 mg daily, as well as Lasix.  The patient tends to begin to retain fluid after a few days of not being on diuretic.  I will restart him on Lasix 20 mg 1 day and 10 mg the next day to maintain fluid balance.  2.  Status post mitral valve repair: With angioplasty in 2008: The patient's repeat echocardiogram revealed severe mitral valve regurgitation.  Dr. Rennis GoldenHilty would like to plan a  TEE on September 14, 2019 to better evaluate his mitral valve.  Of note, the patient does not wish to undergo any invasive surgical options for mitral valve repair in light of underlying health conditions to include his Parkinson's.  This will be discussed further by Dr. Rennis GoldenHilty on completion of the procedure concerning  his recommendations.  I have discussed the TEE procedure, risks and benefits, and he is also provided with written instructions concerning the procedure for educational purposes.  Both the patient and his wife have agreed to proceed with the TEE.  3.  Atrial fibrillation: Heart rate remains irregular today.  He is on Coumadin therapy as he is unable to tolerate Eliquis.  He is being seen today by pharmacist for Coumadin dosing and INR check. INR 4.1 today. He will have repeat labs on Oct 19, prior to having TEE.   4.  Hyperlipidemia: He will continue atorvastatin 25 mg daily.  5.  Parkinson's disease: Being followed by neurology.  Current medicines are reviewed at length with the patient today.    Labs/ tests ordered today include: TEE pre-orders    Bettey Mare. Liborio Nixon, ANP, AACC   09/05/2019 10:45 AM    [facility] [address] Office [phone] Fax [phone]  Notice: This dictation was prepared with Dragon dictation along with smaller phrase technology. Any transcriptional errors that result from this process are unintentional and may not be corrected upon review.

## 2019-09-05 ENCOUNTER — Ambulatory Visit (INDEPENDENT_AMBULATORY_CARE_PROVIDER_SITE_OTHER): Payer: Medicare Other | Admitting: Pharmacist

## 2019-09-05 ENCOUNTER — Other Ambulatory Visit: Payer: Self-pay

## 2019-09-05 ENCOUNTER — Ambulatory Visit: Payer: Medicare Other | Admitting: Adult Health

## 2019-09-05 ENCOUNTER — Encounter: Payer: Self-pay | Admitting: Adult Health

## 2019-09-05 VITALS — BP 119/75 | HR 91 | Ht 68.0 in | Wt 169.8 lb

## 2019-09-05 DIAGNOSIS — I480 Paroxysmal atrial fibrillation: Secondary | ICD-10-CM | POA: Diagnosis not present

## 2019-09-05 DIAGNOSIS — Q233 Congenital mitral insufficiency: Secondary | ICD-10-CM

## 2019-09-05 DIAGNOSIS — Z7901 Long term (current) use of anticoagulants: Secondary | ICD-10-CM | POA: Diagnosis not present

## 2019-09-05 DIAGNOSIS — I340 Nonrheumatic mitral (valve) insufficiency: Secondary | ICD-10-CM

## 2019-09-05 DIAGNOSIS — G20 Parkinson's disease: Secondary | ICD-10-CM | POA: Diagnosis not present

## 2019-09-05 DIAGNOSIS — E785 Hyperlipidemia, unspecified: Secondary | ICD-10-CM

## 2019-09-05 LAB — POCT INR: INR: 4.8 — AB (ref 2.0–3.0)

## 2019-09-05 MED ORDER — FUROSEMIDE 20 MG PO TABS: 20.0000 mg | ORAL_TABLET | Freq: Every day | ORAL | 3 refills | Status: DC

## 2019-09-05 NOTE — Patient Instructions (Signed)
Medication Instructions:  RESTART- Furosemide(Lasix) take 20 mg one day then 10 mg the next day  If you need a refill on your cardiac medications before your next appointment, please call your pharmacy.  Labwork: CBC and BMP HERE IN OUR OFFICE AT [facility]  You will NOT need to fast   Take the provided lab slips with you to the lab for your blood draw.   When you have your labs (blood work) drawn today and your tests are completely normal, you will receive your results only by MyChart Message (if you have MyChart) -OR-  A paper copy in the mail.  If you have any lab test that is abnormal or we need to change your treatment, we will call you to review these results.  Testing/Procedures: Your physician has requested that you have a TEE. During a TEE, sound waves are used to create images of your heart. It provides your doctor with information about the size and shape of your heart and how well your heart's chambers and valves are working. In this test, a transducer is attached to the end of a flexible tube that's guided down your throat and into your esophagus (the tube leading from you mouth to your stomach) to get a more detailed image of your heart. You are not awake for the procedure. Please see the instruction sheet given to you today. For further information please visit [email or web address]  Special Instructions: Dear [name] [name]  You are scheduled for a TEE on Friday October 23rd with Dr. Rennis Golden.  Please arrive at the [facility] (Main Entrance A) at [facility]: [address] [place], [place] [address] at 10:30 am. (1 hour prior to procedure unless lab work is needed; if lab work is needed arrive 1.5 hours ahead)  DIET: Nothing to eat or drink after midnight except a sip of water with medications (see medication instructions below)  Medication Instructions: Hold Furosemide(Lasix)  Continue your anticoagulant: Coumadin You will need to continue your anticoagulant after your  procedure until you are told by your Provider that it is safe to stop   Labs: BMP and CBC  Come to: [address]  You must have a responsible person to drive you home and stay in the waiting area during your procedure. Failure to do so could result in cancellation.  Bring your insurance cards.  *Special Note: Every effort is made to have your procedure done on time. Occasionally there are emergencies that occur at the hospital that may cause delays. Please be patient if a delay does occur.    Follow-Up: . Your physician recommends that you schedule a follow-up appointment in: 1 Month with Dr Rennis Golden   At [facility], you and your health needs are our priority.  As part of our continuing mission to provide you with exceptional heart care, we have created designated Provider Care Teams.  These Care Teams include your primary Cardiologist (physician) and Advanced Practice Providers (APPs -  Physician Assistants and Nurse Practitioners) who all work together to provide you with the care you need, when you need it.  Thank you for choosing [facility] at [facility]!!     Transesophageal Echocardiogram Transesophageal echocardiogram (TEE) is a test that uses sound waves to take pictures of your heart. TEE is done by passing a flexible tube down the esophagus. The esophagus is the tube that carries food from the throat to the stomach. The pictures give detailed images of your heart. This can help your  doctor see if there are problems with your heart. What happens before the procedure? Staying hydrated Follow instructions from your doctor about hydration, which may include:  Up to 3 hours before the procedure - you may continue to drink clear liquids, such as: ? Water. ? Clear fruit juice. ? Black coffee. ? Plain tea.  Eating and drinking Follow instructions from your doctor about eating and drinking, which may include:  8 hours before the procedure - stop eating heavy  meals or foods such as meat, fried foods, or fatty foods.  6 hours before the procedure - stop eating light meals or foods, such as toast or cereal.  6 hours before the procedure - stop drinking milk or drinks that contain milk.  3 hours before the procedure - stop drinking clear liquids. General instructions  You will need to take out any dentures or retainers.  Plan to have someone take you home from the hospital or clinic.  If you will be going home right after the procedure, plan to have someone with you for 24 hours.  Ask your doctor about: ? Changing or stopping your normal medicines. This is important if you take diabetes medicines or blood thinners. ? Taking over-the-counter medicines, vitamins, herbs, and supplements. ? Taking medicines such as aspirin and ibuprofen. These medicines can thin your blood. Do not take these medicines unless your doctor tells you to take them. What happens during the procedure?  To lower your risk of infection, your doctors will wash or clean their hands.  An IV will be put into one of your veins.  You will be given a medicine to help you relax (sedative).  A medicine may be sprayed or gargled. This numbs the back of your throat.  Your blood pressure, heart rate, and breathing will be watched.  You may be asked to lay on your left side.  A bite block will be placed in your mouth. This keeps you from biting the tube.  The tip of the TEE probe will be placed into the back of your mouth.  You will be asked to swallow.  Your doctor will take pictures of your heart.  The probe and bite block will be taken out. The procedure may vary among doctors and hospitals. What happens after the procedure?   Your blood pressure, heart rate, breathing rate, and blood oxygen level will be watched until the medicines you were given have worn off.  When you first wake up, your throat may feel sore and numb. This will get better over time. You will not  be allowed to eat or drink until the numbness has gone away.  Do not drive for 24 hours if you were given a medicine to help you relax. Summary  TEE is a test that uses sound waves to take pictures of your heart.  You will be given a medicine to help you relax.  Do not drive for 24 hours if you were given a medicine to help you relax. This information is not intended to replace advice given to you by your health care provider. Make sure you discuss any questions you have with your health care provider. Document Released: 09/05/2009 Document Revised: 07/28/2018 Document Reviewed: 02/09/2017 Elsevier Patient Education  2020 [employer].

## 2019-09-07 ENCOUNTER — Other Ambulatory Visit (INDEPENDENT_AMBULATORY_CARE_PROVIDER_SITE_OTHER): Payer: Medicare Other

## 2019-09-07 DIAGNOSIS — I480 Paroxysmal atrial fibrillation: Secondary | ICD-10-CM | POA: Diagnosis not present

## 2019-09-07 DIAGNOSIS — Q233 Congenital mitral insufficiency: Secondary | ICD-10-CM

## 2019-09-07 DIAGNOSIS — I340 Nonrheumatic mitral (valve) insufficiency: Secondary | ICD-10-CM

## 2019-09-07 DIAGNOSIS — Z7901 Long term (current) use of anticoagulants: Secondary | ICD-10-CM | POA: Diagnosis not present

## 2019-09-10 ENCOUNTER — Other Ambulatory Visit: Payer: Self-pay

## 2019-09-10 ENCOUNTER — Ambulatory Visit (INDEPENDENT_AMBULATORY_CARE_PROVIDER_SITE_OTHER): Payer: Medicare Other | Admitting: Pharmacist

## 2019-09-10 DIAGNOSIS — I480 Paroxysmal atrial fibrillation: Secondary | ICD-10-CM

## 2019-09-10 DIAGNOSIS — Z7901 Long term (current) use of anticoagulants: Secondary | ICD-10-CM

## 2019-09-10 LAB — CBC
Hematocrit: 44.9 % (ref 37.5–51.0)
Hemoglobin: 15 g/dL (ref 13.0–17.7)
MCH: 31.6 pg (ref 26.6–33.0)
MCHC: 33.4 g/dL (ref 31.5–35.7)
MCV: 95 fL (ref 79–97)
Platelets: 120 10*3/uL — ABNORMAL LOW (ref 150–450)
RBC: 4.74 x10E6/uL (ref 4.14–5.80)
RDW: 13.9 % (ref 11.6–15.4)
WBC: 4.8 10*3/uL (ref 3.4–10.8)

## 2019-09-10 LAB — BASIC METABOLIC PANEL
BUN/Creatinine Ratio: 24 (ref 10–24)
BUN: 26 mg/dL (ref 8–27)
CO2: 27 mmol/L (ref 20–29)
Calcium: 9 mg/dL (ref 8.6–10.2)
Chloride: 102 mmol/L (ref 96–106)
Creatinine, Ser: 1.08 mg/dL (ref 0.76–1.27)
GFR calc Af Amer: 72 mL/min/{1.73_m2} (ref >59)
GFR calc non Af Amer: 62 mL/min/{1.73_m2} (ref >59)
Glucose: 104 mg/dL — ABNORMAL HIGH (ref 65–99)
Potassium: 4.7 mmol/L (ref 3.5–5.2)
Sodium: 143 mmol/L (ref 134–144)

## 2019-09-10 LAB — POCT INR: INR: 3.2 — AB (ref 2.0–3.0)

## 2019-09-12 ENCOUNTER — Inpatient Hospital Stay (HOSPITAL_COMMUNITY)
Admission: EM | Admit: 2019-09-12 | Discharge: 2019-09-22 | DRG: 481 | Disposition: A | Discharge status: Skilled Nursing Facility | Payer: Medicare Other | Attending: Internal Medicine | Admitting: Internal Medicine

## 2019-09-12 ENCOUNTER — Emergency Department (HOSPITAL_COMMUNITY): Payer: Medicare Other

## 2019-09-12 ENCOUNTER — Inpatient Hospital Stay (HOSPITAL_COMMUNITY): Admission: RE | Admit: 2019-09-12 | Payer: Medicare Other | Source: Ambulatory Visit

## 2019-09-12 ENCOUNTER — Other Ambulatory Visit: Payer: Self-pay

## 2019-09-12 ENCOUNTER — Telehealth: Payer: Self-pay | Admitting: Adult Health

## 2019-09-12 ENCOUNTER — Telehealth: Payer: Self-pay | Admitting: Internal Medicine

## 2019-09-12 ENCOUNTER — Encounter (HOSPITAL_COMMUNITY): Payer: Self-pay

## 2019-09-12 DIAGNOSIS — Z823 Family history of stroke: Secondary | ICD-10-CM

## 2019-09-12 DIAGNOSIS — W01118A Fall on same level from slipping, tripping and stumbling with subsequent striking against other sharp object, initial encounter: Secondary | ICD-10-CM | POA: Diagnosis present

## 2019-09-12 DIAGNOSIS — I5022 Chronic systolic (congestive) heart failure: Secondary | ICD-10-CM

## 2019-09-12 DIAGNOSIS — T148XXA Other injury of unspecified body region, initial encounter: Secondary | ICD-10-CM

## 2019-09-12 DIAGNOSIS — Z419 Encounter for procedure for purposes other than remedying health state, unspecified: Secondary | ICD-10-CM

## 2019-09-12 DIAGNOSIS — Z20828 Contact with and (suspected) exposure to other viral communicable diseases: Secondary | ICD-10-CM | POA: Diagnosis present

## 2019-09-12 DIAGNOSIS — E785 Hyperlipidemia, unspecified: Secondary | ICD-10-CM | POA: Diagnosis present

## 2019-09-12 DIAGNOSIS — Z952 Presence of prosthetic heart valve: Secondary | ICD-10-CM

## 2019-09-12 DIAGNOSIS — G20 Parkinson's disease: Secondary | ICD-10-CM | POA: Diagnosis present

## 2019-09-12 DIAGNOSIS — I959 Hypotension, unspecified: Secondary | ICD-10-CM | POA: Diagnosis not present

## 2019-09-12 DIAGNOSIS — Y92003 Bedroom of unspecified non-institutional (private) residence as the place of occurrence of the external cause: Secondary | ICD-10-CM

## 2019-09-12 DIAGNOSIS — I480 Paroxysmal atrial fibrillation: Secondary | ICD-10-CM

## 2019-09-12 DIAGNOSIS — I340 Nonrheumatic mitral (valve) insufficiency: Secondary | ICD-10-CM | POA: Diagnosis present

## 2019-09-12 DIAGNOSIS — I110 Hypertensive heart disease with heart failure: Secondary | ICD-10-CM | POA: Diagnosis present

## 2019-09-12 DIAGNOSIS — F0390 Unspecified dementia without behavioral disturbance: Secondary | ICD-10-CM

## 2019-09-12 DIAGNOSIS — R319 Hematuria, unspecified: Secondary | ICD-10-CM | POA: Diagnosis not present

## 2019-09-12 DIAGNOSIS — I482 Chronic atrial fibrillation, unspecified: Secondary | ICD-10-CM | POA: Diagnosis present

## 2019-09-12 DIAGNOSIS — M25512 Pain in left shoulder: Secondary | ICD-10-CM | POA: Diagnosis present

## 2019-09-12 DIAGNOSIS — Z8679 Personal history of other diseases of the circulatory system: Secondary | ICD-10-CM

## 2019-09-12 DIAGNOSIS — F0280 Dementia in other diseases classified elsewhere without behavioral disturbance: Secondary | ICD-10-CM | POA: Diagnosis present

## 2019-09-12 DIAGNOSIS — Z888 Allergy status to other drugs, medicaments and biological substances status: Secondary | ICD-10-CM

## 2019-09-12 DIAGNOSIS — Z79899 Other long term (current) drug therapy: Secondary | ICD-10-CM

## 2019-09-12 DIAGNOSIS — E8889 Other specified metabolic disorders: Secondary | ICD-10-CM | POA: Diagnosis present

## 2019-09-12 DIAGNOSIS — D62 Acute posthemorrhagic anemia: Secondary | ICD-10-CM | POA: Diagnosis not present

## 2019-09-12 DIAGNOSIS — S72142A Displaced intertrochanteric fracture of left femur, initial encounter for closed fracture: Secondary | ICD-10-CM | POA: Diagnosis present

## 2019-09-12 DIAGNOSIS — R791 Abnormal coagulation profile: Secondary | ICD-10-CM | POA: Diagnosis present

## 2019-09-12 DIAGNOSIS — Z9889 Other specified postprocedural states: Secondary | ICD-10-CM

## 2019-09-12 DIAGNOSIS — D696 Thrombocytopenia, unspecified: Secondary | ICD-10-CM

## 2019-09-12 DIAGNOSIS — Q233 Congenital mitral insufficiency: Secondary | ICD-10-CM

## 2019-09-12 DIAGNOSIS — M25552 Pain in left hip: Secondary | ICD-10-CM

## 2019-09-12 DIAGNOSIS — Z7901 Long term (current) use of anticoagulants: Secondary | ICD-10-CM

## 2019-09-12 DIAGNOSIS — Z91048 Other nonmedicinal substance allergy status: Secondary | ICD-10-CM

## 2019-09-12 DIAGNOSIS — S01312A Laceration without foreign body of left ear, initial encounter: Secondary | ICD-10-CM

## 2019-09-12 DIAGNOSIS — I38 Endocarditis, valve unspecified: Secondary | ICD-10-CM | POA: Diagnosis present

## 2019-09-12 DIAGNOSIS — G459 Transient cerebral ischemic attack, unspecified: Secondary | ICD-10-CM

## 2019-09-12 DIAGNOSIS — Z8673 Personal history of transient ischemic attack (TIA), and cerebral infarction without residual deficits: Secondary | ICD-10-CM

## 2019-09-12 DIAGNOSIS — W19XXXA Unspecified fall, initial encounter: Secondary | ICD-10-CM

## 2019-09-12 LAB — CBC
HCT: 45.6 % (ref 39.0–52.0)
Hemoglobin: 14.8 g/dL (ref 13.0–17.0)
MCH: 32 pg (ref 26.0–34.0)
MCHC: 32.5 g/dL (ref 30.0–36.0)
MCV: 98.5 fL (ref 80.0–100.0)
Platelets: 108 10*3/uL — ABNORMAL LOW (ref 150–400)
RBC: 4.63 MIL/uL (ref 4.22–5.81)
RDW: 16.5 % — ABNORMAL HIGH (ref 11.5–15.5)
WBC: 7.8 10*3/uL (ref 4.0–10.5)
nRBC: 0 % (ref 0.0–0.2)

## 2019-09-12 LAB — PROTIME-INR
INR: 3.4 — ABNORMAL HIGH (ref 0.8–1.2)
Prothrombin Time: 33.7 seconds — ABNORMAL HIGH (ref 11.4–15.2)

## 2019-09-12 LAB — BASIC METABOLIC PANEL
Anion gap: 9 (ref 5–15)
BUN: 26 mg/dL — ABNORMAL HIGH (ref 8–23)
CO2: 27 mmol/L (ref 22–32)
Calcium: 9 mg/dL (ref 8.9–10.3)
Chloride: 103 mmol/L (ref 98–111)
Creatinine, Ser: 1.2 mg/dL (ref 0.61–1.24)
GFR calc Af Amer: >60 mL/min (ref >60)
GFR calc non Af Amer: 55 mL/min — ABNORMAL LOW (ref >60)
Glucose, Bld: 139 mg/dL — ABNORMAL HIGH (ref 70–99)
Potassium: 4 mmol/L (ref 3.5–5.1)
Sodium: 139 mmol/L (ref 135–145)

## 2019-09-12 MED ORDER — LIDOCAINE HCL (PF) 1 % IJ SOLN
30.0000 mL | Freq: Once | INTRAMUSCULAR | Status: AC
  Administered 2019-09-13: 30 mL
  Filled 2019-09-12: qty 30

## 2019-09-12 MED ORDER — TETANUS-DIPHTH-ACELL PERTUSSIS 5-2.5-18.5 LF-MCG/0.5 IM SUSP
0.5000 mL | Freq: Once | INTRAMUSCULAR | Status: AC
  Administered 2019-09-12: 0.5 mL via INTRAMUSCULAR
  Filled 2019-09-12: qty 0.5

## 2019-09-12 NOTE — Telephone Encounter (Signed)
Spoke with pt and wife, for the last couple days the patient has developed a rash on the back of his hands. The right hand is swollen more than the left, the rash is under the skin and does not itch. He also has puffiness under the right eye, the left eye is normal. He denies any trouble with breathing or throat closing. The eye will go down by the end of the day, he has no other swelling. They are also requesting to cancel the TEE he is to have on Friday. They patient is too weak and is not going to do any surgery for the valve and does not want to have the test done. The TEE has been cancelled. He describes SOB at rest sitting in the chair. He is going to take the fluid pill today because he feels better and eats better after taking it. There are no new medication that he is taking. They would like to know what Curt Bears thinks, will forward to DNP to review and advise.

## 2019-09-12 NOTE — Telephone Encounter (Signed)
Wife is calling because patient woke up with a rash on the back of his hands and one eye was swollen.Wants advice on cancelling procedure on Friday.

## 2019-09-12 NOTE — ED Triage Notes (Addendum)
Pt from home BIB [facility] due to mechanical; fall around 1100 this morning. Pt report slipping on rug and striking head and left side of body. Abrasion to l ear and shoulder. Pt is on blood thinners. Currently c/o l hip pain being unable to bear weight and difficulty with range of motion. Pt is alert oriented x4, l hip does appear shorter and angled outwards.

## 2019-09-12 NOTE — ED Provider Notes (Signed)
[facility] EMERGENCY DEPARTMENT Provider Note   CSN: [id number] Arrival date & time: 09/12/19  1920     History   Chief Complaint Chief Complaint  Patient presents with  . Fall    HPI [name] [name] is a 85 y.o. male with history of Parkinson's, stroke, and atrial fibrillation anticoagulated on Coumadin who presents with fall that occurred this morning.  Patient reports he was getting out of bed and tripped.  He did not feel dizzy or lightheaded prior to the fall.  He has had and sustained laceration to his ear and has left hip and shoulder pain.  Did not lose consciousness.  Patient did not come because he "did not want to bother [place]."     HPI  Past Medical History:  Diagnosis Date  . Dyslipidemia   . Parkinson's disease (HCC)   . S/P mitral valve replacement    mitral regurg, endocarditis  . Stroke [facility])     Patient Active Problem List   Diagnosis Date Noted  . Long term (current) use of anticoagulants 08/20/2019  . HLD (hyperlipidemia) 06/02/2015  . PD (Parkinson's disease) (HCC) 06/02/2015  . Mitral regurgitation 03/17/2015  . CVA (cerebral infarction)   . Slurred speech 03/05/2015  . Anxiety 12/19/2013  . Parkinson's disease (HCC) 08/06/2013  . Dyslipidemia 08/06/2013    Past Surgical History:  Procedure Laterality Date  . CATARACT EXTRACTION  2009, 2012   right 2009, left 2012  . LOOP RECORDER IMPLANT Left 03/13/2015   Procedure: LOOP RECORDER IMPLANT;  Surgeon: Marinus Maw, MD;  Location: [facility] CATH LAB;  Service: Cardiovascular;  Laterality: Left;  . 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 TEE WITHOUT CARDIOVERSION N/A 03/13/2015   Procedure: TRANSESOPHAGEAL ECHOCARDIOGRAM (TEE);  Surgeon: Laurey Morale, MD;  Location: [facility] ENDOSCOPY;  Service: Cardiovascular;  Laterality: N/A;  . TRANSTHORACIC ECHOCARDIOGRAM  08/15/2012   EF=>55%; normal LV systolic  function; RV mildly dilated & systolic function mildly reduced; RA mod dilated; mild -mod MR & mildy increased gradients; mild TR; AV mildly sclerotic with mild-mod regurg        Home Medications    Prior to Admission medications   Medication Sig Start Date End Date Taking? Authorizing Provider  acetaminophen (TYLENOL) 500 MG tablet Take 500 mg by mouth every 6 (six) hours as needed for mild pain or headache.   Yes [provider]  amantadine (SYMMETREL) 100 MG capsule Take 100 mg by mouth See admin instructions. Take 100 mg by mouth at 1 PM and 100 mg at 6 PM   Yes [provider]  amoxicillin (AMOXIL) 500 MG tablet Take 1 tablet (500 mg total) by mouth 2 (two) times daily. Patient taking differently: Take 2,000 mg by mouth See admin instructions. Take 2,000 mg by mouth one hour prior to dental appointments 12/29/17  Yes End, Cristal Deer, MD  atorvastatin (LIPITOR) 20 MG tablet Take 20 mg by mouth every evening.  02/18/15  Yes [provider]  Cholecalciferol (VITAMIN D-3) 25 MCG (1000 UT) CAPS Take 1,000 Units by mouth daily.   Yes [provider]  donepezil (ARICEPT) 10 MG tablet Take 10 mg by mouth at bedtime.  08/02/19  Yes [provider]  escitalopram (LEXAPRO) 10 MG tablet Take 10 mg by mouth daily.   Yes [provider]  furosemide (LASIX) 20 MG tablet Take 1 tablet (20 mg total)  by mouth daily. Patient taking differently: Take 10-20 mg by mouth See admin instructions. Take 10 mg by mouth once a day, alternating with 20 mg every other day 09/05/19 12/04/19 Yes Lendon Colonel, NP  metoprolol succinate (TOPROL-XL) 25 MG 24 hr tablet Take 12.5 mg by mouth daily.   Yes [provider]  Multiple Vitamins-Minerals (MULTIVITAMIN PO) Take 1 tablet by mouth daily.    Yes [provider]  rotigotine (NEUPRO) 4 MG/24HR Place 1 patch onto the skin daily.   Yes [provider]  RYTARY 48.75-195 MG CPCR Take 1-3  capsules by mouth See admin instructions. Take 3 capsules by mouth in the morning, 3 capsules between noon-1 PM, 2 capsules at 6 PM, and 1 capsule at bedtime 02/18/15  Yes [provider]  selegiline (ELDEPRYL) 5 MG tablet Take 2.5-5 mg by mouth See admin instructions. Take 5 mg by mouth in the morning and 2.5 mg at 4 PM 06/15/19  Yes [provider]  vitamin B-12 (CYANOCOBALAMIN) 1000 MCG tablet Take 1,000 mcg by mouth 3 (three) times a week.    Yes [provider]  vitamin E 400 UNIT capsule Take 400 Units by mouth daily.   Yes [provider]  warfarin (COUMADIN) 5 MG tablet Take 1 tablet (5 mg total) by mouth daily. Patient taking differently: Take 2.5 mg by mouth at bedtime.  08/15/19  Yes Lendon Colonel, NP  docusate sodium (COLACE) 100 MG capsule Take 100 mg by mouth daily as needed for mild constipation.    [provider]  Metoprolol Succinate 25 MG CS24 Take 12.5 mg by mouth daily. Patient not taking: Reported on 09/12/2019 08/20/19   Pixie Casino, MD    Family History Family History  Problem Relation Age of Onset  . Stroke Mother        cerebral hemorrahge  . Heart attack Mother   . Prostate cancer Father   . Heart attack Father   . Hypertension Brother   . Valvular heart disease Brother     Social History Social History   Tobacco Use  . Smoking status: Never Smoker  . Smokeless tobacco: Never Used  Substance Use Topics  . Alcohol use: Yes    Alcohol/week: 2.0 standard drinks    Types: 1 Glasses of wine, 1 Cans of beer per week    Comment: 1 can of beer or 1 wine every day  . Drug use: No     Allergies   Apixaban, Ace inhibitors, Demerol [meperidine], and Poison ivy extract   Review of Systems Review of Systems  Constitutional: Negative for chills and fever.  HENT: Negative for facial swelling and sore throat.   Respiratory: Negative for shortness of breath.   Cardiovascular: Negative for chest pain.   Gastrointestinal: Negative for abdominal pain, nausea and vomiting.  Genitourinary: Negative for dysuria.  Musculoskeletal: Positive for arthralgias. Negative for back pain.  Skin: Positive for wound. Negative for rash.  Neurological: Negative for headaches.  Psychiatric/Behavioral: The patient is not nervous/anxious.      Physical Exam Updated Vital Signs BP 118/77   Pulse 86   Temp 99.5 F (37.5 C) (Oral)   Resp 20   SpO2 93%   Physical Exam Vitals signs and nursing note reviewed.  Constitutional:      General: He is not in acute distress.    Appearance: He is well-developed. He is not diaphoretic.  HENT:     Head: Normocephalic and atraumatic.  Ears:     Comments: Laceration to the superior part of the pinna    Mouth/Throat:     Pharynx: No oropharyngeal exudate.  Eyes:     General: No scleral icterus.       Right eye: No discharge.        Left eye: No discharge.     Conjunctiva/sclera: Conjunctivae normal.     Pupils: Pupils are equal, round, and reactive to light.  Neck:     Musculoskeletal: Normal range of motion and neck supple.     Thyroid: No thyromegaly.  Cardiovascular:     Rate and Rhythm: Normal rate and regular rhythm.     Heart sounds: Normal heart sounds. No murmur. No friction rub. No gallop.   Pulmonary:     Effort: Pulmonary effort is normal. No respiratory distress.     Breath sounds: Normal breath sounds. No stridor. No wheezing or rales.  Abdominal:     General: Bowel sounds are normal. There is no distension.     Palpations: Abdomen is soft.     Tenderness: There is no abdominal tenderness. There is no guarding or rebound.  Lymphadenopathy:     Cervical: No cervical adenopathy.  Skin:    General: Skin is warm and dry.     Coloration: Skin is not pale.     Findings: No rash.  Neurological:     Mental Status: He is alert.     Coordination: Coordination normal.      ED Treatments / Results  Labs (all labs ordered are listed, but  only abnormal results are displayed) Labs Reviewed  BASIC METABOLIC PANEL - Abnormal; Notable for the following components:      Result Value   Glucose, Bld 139 (*)    BUN 26 (*)    GFR calc non Af Amer 55 (*)    All other components within normal limits  CBC - Abnormal; Notable for the following components:   RDW 16.5 (*)    Platelets 108 (*)    All other components within normal limits  PROTIME-INR - Abnormal; Notable for the following components:   Prothrombin Time 33.7 (*)    INR 3.4 (*)    All other components within normal limits  SARS CORONAVIRUS 2 (TAT 6-24 HRS)    EKG None  Radiology Ct Head Wo Contrast  Result Date: 09/12/2019 CLINICAL DATA:  Fall with head pain EXAM: CT HEAD WITHOUT CONTRAST TECHNIQUE: Contiguous axial images were obtained from the base of the skull through the vertex without intravenous contrast. COMPARISON:  None. FINDINGS: Brain: No evidence of acute infarction, hemorrhage, hydrocephalus, extra-axial collection or mass lesion/mass effect. There is mild cerebral volume loss with associated ex vacuo dilatation. Periventricular and subcortical white matter hypoattenuation likely represents chronic small vessel ischemic disease. Vascular: There are vascular calcifications in the carotid siphons. Skull: Normal. Negative for fracture or focal lesion. Sinuses/Orbits: There is frontal and ethmoid sinus disease. Other: None. IMPRESSION: No acute intracranial process. Electronically Signed   By: Romona Curls M.D.   On: 09/12/2019 21:16   Dg Shoulder Left  Result Date: 09/12/2019 CLINICAL DATA:  Fall EXAM: LEFT SHOULDER - 2+ VIEW COMPARISON:  None. FINDINGS: No fracture or dislocation. High-riding humeral head. AC joint degenerative change IMPRESSION: 1. No acute osseous abnormality 2. High-riding humeral head, consistent with rotator cuff disease Electronically Signed   By: Jasmine Pang M.D.   On: 09/12/2019 20:15   Dg Hip Unilat With Pelvis 2-3 Views Left  Result Date: 09/12/2019 CLINICAL DATA:  Hip pain status post fall EXAM: DG HIP (WITH OR WITHOUT PELVIS) 2-3V LEFT COMPARISON:  None. FINDINGS: Pubic symphysis and rami are intact. Both femoral heads project in joint. Mild degenerative changes bilaterally. Acute displaced left trochanteric fracture. IMPRESSION: Acute displaced left trochanteric fracture Electronically Signed   By: Jasmine PangKim  Fujinaga M.D.   On: 09/12/2019 20:15    Procedures .Marland Kitchen.Laceration Repair  Date/Time: 09/13/2019 12:13 AM Performed by: Emi HolesLaw, Scottlynn Lindell M, PA-C Authorized by: Emi HolesLaw, Dajanae Brophy M, PA-C   Consent:    Consent obtained:  Verbal   Consent given by:  Patient   Risks discussed:  Infection, pain and poor cosmetic result Anesthesia (see MAR for exact dosages):    Anesthesia method:  Nerve block   Block location:  L ear   Block needle gauge:  25 G   Block anesthetic:  Lidocaine 1% w/o epi   Block technique:  Auricular   Block injection procedure:  Anatomic landmarks identified, introduced needle, incremental injection, anatomic landmarks palpated and negative aspiration for blood   Block outcome:  Anesthesia achieved Laceration details:    Location:  Ear   Ear location:  L ear   Length (cm):  3 Repair type:    Repair type:  Simple Pre-procedure details:    Preparation:  Patient was prepped and draped in usual sterile fashion and imaging obtained to evaluate for foreign bodies Exploration:    Hemostasis achieved with:  Direct pressure   Wound exploration: wound explored through full range of motion and entire depth of wound probed and visualized     Wound extent: no foreign bodies/material noted and no muscle damage noted     Contaminated: no   Treatment:    Area cleansed with:  Saline   Amount of cleaning:  Standard   Irrigation solution:  Sterile saline   Irrigation volume:  50   Irrigation method:  Syringe   Visualized foreign bodies/material removed: no   Skin repair:    Repair method:  Sutures   Suture  size:  5-0   Wound skin closure material used: Vicryl rapide.   Suture technique:  Simple interrupted   Number of sutures:  5 Approximation:    Approximation:  Loose Post-procedure details:    Dressing:  Bulky dressing   Patient tolerance of procedure:  Tolerated well, no immediate complications .Marland Kitchen.Laceration Repair  Date/Time: 09/13/2019 12:19 AM Performed by: Emi HolesLaw, Criston Chancellor M, PA-C Authorized by: Emi HolesLaw, Clydell Sposito M, PA-C   Consent:    Consent obtained:  Verbal   Consent given by:  Patient   Risks discussed:  Infection, pain and poor cosmetic result Anesthesia (see MAR for exact dosages):    Anesthesia method:  Nerve block   Block location:  L ear   Block needle gauge:  25 G   Block anesthetic:  Lidocaine 1% w/o epi   Block technique:  Auricular block   Block injection procedure:  Anatomic landmarks identified, introduced needle, incremental injection, anatomic landmarks palpated and negative aspiration for blood   Block outcome:  Anesthesia achieved Laceration details:    Location:  Ear   Ear location:  L ear   Length (cm):  0.5 Repair type:    Repair type:  Simple Pre-procedure details:    Preparation:  Patient was prepped and draped in usual sterile fashion and imaging obtained to evaluate for foreign bodies Exploration:    Hemostasis achieved with:  Direct pressure   Wound exploration: wound explored through full range of motion  and entire depth of wound probed and visualized     Wound extent: no foreign bodies/material noted and no muscle damage noted     Contaminated: no   Treatment:    Area cleansed with:  Saline   Amount of cleaning:  Standard   Irrigation solution:  Sterile saline   Irrigation volume:  20   Irrigation method:  Syringe   Visualized foreign bodies/material removed: no   Skin repair:    Repair method:  Sutures   Suture size:  5-0   Wound skin closure material used: Vicryl Rapide.   Suture technique:  Simple interrupted   Number of sutures:  1  Approximation:    Approximation:  Close Post-procedure details:    Dressing:  Non-adherent dressing   Patient tolerance of procedure:  Tolerated well, no immediate complications   (including critical care time)  Medications Ordered in ED Medications  lidocaine (PF) (XYLOCAINE) 1 % injection 30 mL (has no administration in time range)  Tdap (BOOSTRIX) injection 0.5 mL (0.5 mLs Intramuscular Given 09/12/19 2242)     Initial Impression / Assessment and Plan / ED Course  I have reviewed the triage vital signs and the nursing notes.  Pertinent labs & imaging results that were available during my care of the patient were reviewed by me and considered in my medical decision making (see chart for details).        Patient presenting following fall with ear laceration and hematoma as well as left hip fracture.  Ear laceration repaired as above.  Patient counseled on possibility cauliflower ear.  Ice over the next few days to important.  Left hip fracture discussed with Dr. August Saucer who has admission to medicine and they will repair tomorrow, NPO after midnight and holding Coumadin.  Patient and wife understand agree with plan.  I appreciate the above consultants for their assistance with the patient.  Final Clinical Impressions(s) / ED Diagnoses   Final diagnoses:  Fall, initial encounter  Left hip pain  Laceration of left ear, initial encounter    ED Discharge Orders    None       Emi Holes, PA-C 09/13/19 Tera Helper, MD 09/13/19 [phone]

## 2019-09-12 NOTE — Consult Note (Signed)
ORTHOPAEDIC CONSULTATION  REQUESTING PHYSICIAN: Varney Biles, MD  Chief Complaint: "My left hip hurts"  HPI: [name] [name] is a 85 y.o. male who presents with left hip pain.  Patient states that he fell when he slipped on a rug earlier this morning.  Patient notes severe pain with weightbearing. Denies any history of diabetes, smoking.  Patient has history of parkinson's disease and stroke. He is currently taking Coumadin.  Last dose of Coumadin was 6PM today.  INR of 3.4.   Past Medical History:  Diagnosis Date  . Dyslipidemia   . Parkinson's disease ([place])   . S/P mitral valve replacement    mitral regurg, endocarditis  . Stroke [facility])    Past Surgical History:  Procedure Laterality Date  . CATARACT EXTRACTION  2009, 2012   right 2009, left 2012  . LOOP RECORDER IMPLANT Left 03/13/2015   Procedure: LOOP RECORDER IMPLANT;  Surgeon: Evans Lance, MD;  Location: [facility] CATH LAB;  Service: Cardiovascular;  Laterality: Left;  . MITRAL VALVE ANNULOPLASTY  04/10/2007    80mm Edwards ring; r/t h/o MR and flail mitral leaflets due to endocarditis: MRI safe  . TEE WITHOUT CARDIOVERSION N/A 03/13/2015   Procedure: TRANSESOPHAGEAL ECHOCARDIOGRAM (TEE);  Surgeon: Larey Dresser, MD;  Location: [facility];  Service: Cardiovascular;  Laterality: N/A;  . TRANSTHORACIC ECHOCARDIOGRAM  08/15/2012   EF=>55%; normal LV systolic function; RV mildly dilated & systolic function mildly reduced; RA mod dilated; mild -mod MR & mildy increased gradients; mild TR; AV mildly sclerotic with mild-mod regurg   Social History   Socioeconomic History  . Marital status: Married    Spouse name: Not on file  . Number of children: 3  . Years of education: law school  . Highest education level: Not on file  Occupational History  . Occupation: retired  Scientific laboratory technician  . Financial resource strain: Not on file  . Food insecurity    Worry: Not on file    Inability: Not on file  . Transportation needs    Medical: Not  on file    Non-medical: Not on file  Tobacco Use  . Smoking status: Never Smoker  . Smokeless tobacco: Never Used  Substance and Sexual Activity  . Alcohol use: Yes    Alcohol/week: 2.0 standard drinks    Types: 1 Glasses of wine, 1 Cans of beer per week    Comment: 1 can of beer or 1 wine every day  . Drug use: No  . Sexual activity: Not on file  Lifestyle  . Physical activity    Days per week: Not on file    Minutes per session: Not on file  . Stress: Not on file  Relationships  . Social Herbalist on phone: Not on file    Gets together: Not on file    Attends religious service: Not on file    Active member of club or organization: Not on file    Attends meetings of clubs or organizations: Not on file    Relationship status: Not on file  Other Topics Concern  . Not on file  Social History Narrative  . Not on file   Family History  Problem Relation Age of Onset  . Stroke Mother        cerebral hemorrahge  . Heart attack Mother   . Prostate cancer Father   . Heart attack Father   . Hypertension Brother   . Valvular heart disease Brother    -  negative except otherwise stated in the family history section Allergies  Allergen Reactions  . Apixaban     Worsening tardive dyskinesia.  Rash, rapid heart rate.  . Ace Inhibitors Cough  . Demerol [Meperidine] Other (See Comments)    Parkinsons disease  . Poison Ivy Extract     Unknown reaction   Prior to Admission medications   Medication Sig Start Date End Date Taking? Authorizing Provider  acetaminophen (TYLENOL) 500 MG tablet Take 500 mg by mouth every 6 (six) hours as needed for mild pain or headache.   Yes [provider]  amantadine (SYMMETREL) 100 MG capsule Take 100 mg by mouth See admin instructions. Take 100 mg by mouth at 1 PM and 100 mg at 6 PM   Yes [provider]  amoxicillin (AMOXIL) 500 MG tablet Take 1 tablet (500 mg total) by mouth 2 (two) times daily. Patient taking  differently: Take 2,000 mg by mouth See admin instructions. Take 2,000 mg by mouth one hour prior to dental appointments 12/29/17  Yes End, Cristal Deer, MD  atorvastatin (LIPITOR) 20 MG tablet Take 20 mg by mouth every evening.  02/18/15  Yes [provider]  Cholecalciferol (VITAMIN D-3) 25 MCG (1000 UT) CAPS Take 1,000 Units by mouth daily.   Yes [provider]  donepezil (ARICEPT) 10 MG tablet Take 10 mg by mouth at bedtime.  08/02/19  Yes [provider]  escitalopram (LEXAPRO) 10 MG tablet Take 10 mg by mouth daily.   Yes [provider]  Multiple Vitamins-Minerals (MULTIVITAMIN PO) Take 1 tablet by mouth daily.    Yes [provider]  rotigotine (NEUPRO) 4 MG/24HR Place 1 patch onto the skin daily.   Yes [provider]  RYTARY 48.75-195 MG CPCR Take 1-3 capsules by mouth See admin instructions. Take 3 capsules by mouth in the morning, 3 capsules between noon-1 PM, 2 capsules at 6 PM, and 1 capsule at bedtime 02/18/15  Yes [provider]  vitamin B-12 (CYANOCOBALAMIN) 1000 MCG tablet Take 1,000 mcg by mouth daily.    Yes [provider]  docusate sodium (COLACE) 100 MG capsule Take 100 mg by mouth daily as needed for mild constipation.    [provider]  furosemide (LASIX) 20 MG tablet Take 1 tablet (20 mg total) by mouth daily. Patient taking differently: Take 10-20 mg by mouth See admin instructions. Alternates dosages every other day 09/05/19 12/04/19  Jodelle Gross, NP  Metoprolol Succinate 25 MG CS24 Take 12.5 mg by mouth daily. 08/20/19   Hilty, Lisette Abu, MD  selegiline (ELDEPRYL) 5 MG tablet Take 2.5-5 mg by mouth See admin instructions. 5 mg  In the morning, 2.5 mg in the evening 06/15/19   [provider]  vitamin E 400 UNIT capsule Take 400 Units by mouth daily.    [provider]  warfarin (COUMADIN) 5 MG tablet Take 1 tablet (5 mg total) by mouth daily. Patient taking differently:  Take 2.5 mg by mouth daily.  08/15/19   Jodelle Gross, NP   Ct Head Wo Contrast  Result Date: 09/12/2019 CLINICAL DATA:  Fall with head pain EXAM: CT HEAD WITHOUT CONTRAST TECHNIQUE: Contiguous axial images were obtained from the base of the skull through the vertex without intravenous contrast. COMPARISON:  None. FINDINGS: Brain: No evidence of acute infarction, hemorrhage, hydrocephalus, extra-axial collection or mass lesion/mass effect. There is mild cerebral volume loss with associated ex vacuo dilatation. Periventricular and subcortical white matter hypoattenuation likely represents chronic  small vessel ischemic disease. Vascular: There are vascular calcifications in the carotid siphons. Skull: Normal. Negative for fracture or focal lesion. Sinuses/Orbits: There is frontal and ethmoid sinus disease. Other: None. IMPRESSION: No acute intracranial process. Electronically Signed   By: Romona Curlsyler  Litton M.D.   On: 09/12/2019 21:16   Dg Shoulder Left  Result Date: 09/12/2019 CLINICAL DATA:  Fall EXAM: LEFT SHOULDER - 2+ VIEW COMPARISON:  None. FINDINGS: No fracture or dislocation. High-riding humeral head. AC joint degenerative change IMPRESSION: 1. No acute osseous abnormality 2. High-riding humeral head, consistent with rotator cuff disease Electronically Signed   By: Jasmine PangKim  Fujinaga M.D.   On: 09/12/2019 20:15   Dg Hip Unilat With Pelvis 2-3 Views Left  Result Date: 09/12/2019 CLINICAL DATA:  Hip pain status post fall EXAM: DG HIP (WITH OR WITHOUT PELVIS) 2-3V LEFT COMPARISON:  None. FINDINGS: Pubic symphysis and rami are intact. Both femoral heads project in joint. Mild degenerative changes bilaterally. Acute displaced left trochanteric fracture. IMPRESSION: Acute displaced left trochanteric fracture Electronically Signed   By: Jasmine PangKim  Fujinaga M.D.   On: 09/12/2019 20:15   - pertinent xrays, CT, MRI studies were reviewed and independently interpreted  Positive ROS: All other systems have been  reviewed and were otherwise negative with the exception of those mentioned in the HPI and as above.  Physical Exam: General: Alert, no acute distress Psychiatric: Patient is competent for consent with normal mood and affect Lymphatic: No axillary or cervical lymphadenopathy Cardiovascular: No pedal edema Respiratory: No cyanosis, no use of accessory musculature GI: No organomegaly, abdomen is soft and non-tender    Images:  @ENCIMAGES @  Labs:  Lab Results  Component Value Date   HGBA1C 6.0 (H) 03/07/2015   ESRSEDRATE 2 08/20/2019    Lab Results  Component Value Date   ALBUMIN 4.2 03/05/2015    Neurologic: Patient does not have protective sensation bilateral lower extremities.   MUSCULOSKELETAL:   Left hip pain with logroll of left lower extremity.  Shortened and externally rotated left lower extremity.  No pain with logroll of right hip.  No pain with passive range of motion of the left knee, left ankle, right knee, right ankle.  No pain with passive range of motion of bilateral hands, wrists, elbows, shoulders.  Dorsiflexion and plantarflexion intact in left lower extremity.  1+ PT pulse of the left lower extremity.  Assessment: Left intertrochanteric femur fracture  Plan: Admit to hospitalist team Plan for left hip fracture operative fixation Likely not tomorrow due to patient's INR.  Regular diet for now  Thank you for the consult and the opportunity to see [name]  [name] Esme Durkin, PA-C [facility] 9:57 PM 09/12/2019

## 2019-09-12 NOTE — Telephone Encounter (Signed)
Spoke with pt wife, aware of kathryn's recommendations.

## 2019-09-12 NOTE — ED Notes (Signed)
Patient transported to CT 

## 2019-09-12 NOTE — Telephone Encounter (Signed)
I will call the patient back at the end of the day.  Rash should be evaluated by dermatologist. Don't think any of his medications as we have changed him back to Coumadin.

## 2019-09-12 NOTE — Telephone Encounter (Signed)
I called [name] [name] back today, and listened to their concerns.  Multiple questions were answered.  They were not aware that there was a possibility that he may undergo a "mitral clip" repair of the mitral valve versus open heart surgery.  They are now willing to proceed with transesophageal echocardiogram and would like to have it rescheduled for next week with Dr. Debara Pickett.  I will notify the nurses to have this rescheduled.  He will need to follow-up with Dr. Debara Pickett post procedure to discuss recommendations based upon the results.

## 2019-09-13 DIAGNOSIS — F0390 Unspecified dementia without behavioral disturbance: Secondary | ICD-10-CM | POA: Diagnosis not present

## 2019-09-13 DIAGNOSIS — Z79899 Other long term (current) drug therapy: Secondary | ICD-10-CM | POA: Diagnosis not present

## 2019-09-13 DIAGNOSIS — I340 Nonrheumatic mitral (valve) insufficiency: Secondary | ICD-10-CM | POA: Diagnosis present

## 2019-09-13 DIAGNOSIS — Y92003 Bedroom of unspecified non-institutional (private) residence as the place of occurrence of the external cause: Secondary | ICD-10-CM | POA: Diagnosis not present

## 2019-09-13 DIAGNOSIS — Z8679 Personal history of other diseases of the circulatory system: Secondary | ICD-10-CM | POA: Diagnosis not present

## 2019-09-13 DIAGNOSIS — T148XXA Other injury of unspecified body region, initial encounter: Secondary | ICD-10-CM | POA: Diagnosis not present

## 2019-09-13 DIAGNOSIS — I5022 Chronic systolic (congestive) heart failure: Secondary | ICD-10-CM | POA: Diagnosis present

## 2019-09-13 DIAGNOSIS — F0280 Dementia in other diseases classified elsewhere without behavioral disturbance: Secondary | ICD-10-CM | POA: Diagnosis present

## 2019-09-13 DIAGNOSIS — E8889 Other specified metabolic disorders: Secondary | ICD-10-CM | POA: Diagnosis present

## 2019-09-13 DIAGNOSIS — Z8673 Personal history of transient ischemic attack (TIA), and cerebral infarction without residual deficits: Secondary | ICD-10-CM | POA: Diagnosis not present

## 2019-09-13 DIAGNOSIS — Z01810 Encounter for preprocedural cardiovascular examination: Secondary | ICD-10-CM

## 2019-09-13 DIAGNOSIS — D696 Thrombocytopenia, unspecified: Secondary | ICD-10-CM | POA: Diagnosis present

## 2019-09-13 DIAGNOSIS — I480 Paroxysmal atrial fibrillation: Secondary | ICD-10-CM | POA: Diagnosis present

## 2019-09-13 DIAGNOSIS — I429 Cardiomyopathy, unspecified: Secondary | ICD-10-CM | POA: Diagnosis not present

## 2019-09-13 DIAGNOSIS — I482 Chronic atrial fibrillation, unspecified: Secondary | ICD-10-CM | POA: Diagnosis present

## 2019-09-13 DIAGNOSIS — W01118A Fall on same level from slipping, tripping and stumbling with subsequent striking against other sharp object, initial encounter: Secondary | ICD-10-CM | POA: Diagnosis present

## 2019-09-13 DIAGNOSIS — Z23 Encounter for immunization: Secondary | ICD-10-CM | POA: Diagnosis present

## 2019-09-13 DIAGNOSIS — Q233 Congenital mitral insufficiency: Secondary | ICD-10-CM | POA: Diagnosis present

## 2019-09-13 DIAGNOSIS — E785 Hyperlipidemia, unspecified: Secondary | ICD-10-CM

## 2019-09-13 DIAGNOSIS — Z888 Allergy status to other drugs, medicaments and biological substances status: Secondary | ICD-10-CM | POA: Diagnosis not present

## 2019-09-13 DIAGNOSIS — I110 Hypertensive heart disease with heart failure: Secondary | ICD-10-CM | POA: Diagnosis present

## 2019-09-13 DIAGNOSIS — R791 Abnormal coagulation profile: Secondary | ICD-10-CM | POA: Diagnosis present

## 2019-09-13 DIAGNOSIS — S72142A Displaced intertrochanteric fracture of left femur, initial encounter for closed fracture: Secondary | ICD-10-CM | POA: Diagnosis present

## 2019-09-13 DIAGNOSIS — M25512 Pain in left shoulder: Secondary | ICD-10-CM | POA: Diagnosis present

## 2019-09-13 DIAGNOSIS — I4819 Other persistent atrial fibrillation: Secondary | ICD-10-CM | POA: Diagnosis not present

## 2019-09-13 DIAGNOSIS — I38 Endocarditis, valve unspecified: Secondary | ICD-10-CM | POA: Diagnosis present

## 2019-09-13 DIAGNOSIS — D62 Acute posthemorrhagic anemia: Secondary | ICD-10-CM | POA: Diagnosis not present

## 2019-09-13 DIAGNOSIS — Z9889 Other specified postprocedural states: Secondary | ICD-10-CM

## 2019-09-13 DIAGNOSIS — S72142D Displaced intertrochanteric fracture of left femur, subsequent encounter for closed fracture with routine healing: Secondary | ICD-10-CM | POA: Diagnosis not present

## 2019-09-13 DIAGNOSIS — Z7901 Long term (current) use of anticoagulants: Secondary | ICD-10-CM | POA: Diagnosis not present

## 2019-09-13 DIAGNOSIS — Z20828 Contact with and (suspected) exposure to other viral communicable diseases: Secondary | ICD-10-CM | POA: Diagnosis present

## 2019-09-13 DIAGNOSIS — G20 Parkinson's disease: Secondary | ICD-10-CM | POA: Diagnosis present

## 2019-09-13 DIAGNOSIS — Z823 Family history of stroke: Secondary | ICD-10-CM | POA: Diagnosis not present

## 2019-09-13 DIAGNOSIS — Z91048 Other nonmedicinal substance allergy status: Secondary | ICD-10-CM | POA: Diagnosis not present

## 2019-09-13 DIAGNOSIS — I420 Dilated cardiomyopathy: Secondary | ICD-10-CM | POA: Diagnosis not present

## 2019-09-13 DIAGNOSIS — S01312A Laceration without foreign body of left ear, initial encounter: Secondary | ICD-10-CM

## 2019-09-13 DIAGNOSIS — I519 Heart disease, unspecified: Secondary | ICD-10-CM

## 2019-09-13 LAB — PROTIME-INR
INR: 3.8 — ABNORMAL HIGH (ref 0.8–1.2)
INR: 1.9 — ABNORMAL HIGH (ref 0.8–1.2)
Prothrombin Time: 36.8 seconds — ABNORMAL HIGH (ref 11.4–15.2)
Prothrombin Time: 21.2 seconds — ABNORMAL HIGH (ref 11.4–15.2)

## 2019-09-13 LAB — SARS CORONAVIRUS 2 (TAT 6-24 HRS): SARS Coronavirus 2: NEGATIVE

## 2019-09-13 MED ORDER — FUROSEMIDE 20 MG PO TABS
10.0000 mg | ORAL_TABLET | Freq: Every day | ORAL | Status: DC
  Administered 2019-09-13 – 2019-09-14 (×2): 10 mg via ORAL
  Filled 2019-09-13 (×2): qty 1

## 2019-09-13 MED ORDER — ROTIGOTINE 4 MG/24HR TD PT24: 1.0000 | MEDICATED_PATCH | Freq: Every day | TRANSDERMAL | Status: DC

## 2019-09-13 MED ORDER — METOPROLOL SUCCINATE ER 25 MG PO TB24
12.5000 mg | ORAL_TABLET | Freq: Every day | ORAL | Status: DC
  Administered 2019-09-13 – 2019-09-22 (×10): 12.5 mg via ORAL
  Filled 2019-09-13 (×11): qty 1

## 2019-09-13 MED ORDER — HYDROCODONE-ACETAMINOPHEN 5-325 MG PO TABS: 1.0000 | ORAL_TABLET | Freq: Four times a day (QID) | ORAL | Status: DC | PRN

## 2019-09-13 MED ORDER — SODIUM CHLORIDE 0.9 % IV SOLN
INTRAVENOUS | Status: DC
  Administered 2019-09-13: 22:00:00 via INTRAVENOUS

## 2019-09-13 MED ORDER — CARBIDOPA-LEVODOPA ER 48.75-195 MG PO CPCR: 1.0000 | ORAL_CAPSULE | ORAL | Status: DC

## 2019-09-13 MED ORDER — VITAMIN K1 10 MG/ML IJ SOLN
10.0000 mg | Freq: Once | INTRAVENOUS | Status: AC
  Administered 2019-09-13: 10 mg via INTRAVENOUS
  Filled 2019-09-13: qty 1

## 2019-09-13 MED ORDER — AMANTADINE HCL 100 MG PO CAPS
100.0000 mg | ORAL_CAPSULE | Freq: Two times a day (BID) | ORAL | Status: DC
  Administered 2019-09-13 – 2019-09-22 (×18): 100 mg via ORAL
  Filled 2019-09-13 (×18): qty 1

## 2019-09-13 MED ORDER — ESCITALOPRAM OXALATE 10 MG PO TABS
10.0000 mg | ORAL_TABLET | Freq: Every day | ORAL | Status: DC
  Administered 2019-09-13 – 2019-09-22 (×10): 10 mg via ORAL
  Filled 2019-09-13 (×10): qty 1

## 2019-09-13 MED ORDER — MORPHINE SULFATE (PF) 2 MG/ML IV SOLN: 0.5000 mg | INTRAVENOUS | Status: DC | PRN

## 2019-09-13 MED ORDER — ATORVASTATIN CALCIUM 10 MG PO TABS
20.0000 mg | ORAL_TABLET | Freq: Every evening | ORAL | Status: DC
  Administered 2019-09-13 – 2019-09-21 (×9): 20 mg via ORAL
  Filled 2019-09-13 (×9): qty 2

## 2019-09-13 MED ORDER — SELEGILINE HCL 5 MG PO TABS
5.0000 mg | ORAL_TABLET | Freq: Every day | ORAL | Status: DC
  Administered 2019-09-13 – 2019-09-22 (×10): 5 mg via ORAL
  Filled 2019-09-13 (×10): qty 1

## 2019-09-13 MED ORDER — DONEPEZIL HCL 10 MG PO TABS
10.0000 mg | ORAL_TABLET | Freq: Every day | ORAL | Status: DC
  Administered 2019-09-13 – 2019-09-21 (×9): 10 mg via ORAL
  Filled 2019-09-13 (×10): qty 1

## 2019-09-13 MED ORDER — SELEGILINE HCL 5 MG PO TABS
2.5000 mg | ORAL_TABLET | Freq: Every day | ORAL | Status: DC
  Administered 2019-09-13 – 2019-09-20 (×8): 2.5 mg via ORAL
  Administered 2019-09-21: 16:00:00 via ORAL
  Filled 2019-09-13 (×11): qty 1

## 2019-09-13 MED ORDER — BISACODYL 5 MG PO TBEC: 5.0000 mg | DELAYED_RELEASE_TABLET | Freq: Every day | ORAL | Status: DC | PRN

## 2019-09-13 MED ORDER — SELEGILINE HCL 5 MG PO TABS: 2.5000 mg | ORAL_TABLET | ORAL | Status: DC

## 2019-09-13 MED ORDER — ACETAMINOPHEN 500 MG PO TABS: 500.0000 mg | ORAL_TABLET | Freq: Four times a day (QID) | ORAL | Status: DC | PRN

## 2019-09-13 NOTE — H&P (Addendum)
History and Physical    [name] [name] HOZ:[id number] DOB: 05/25/1932 DOA: 09/12/2019  PCP: Rodrigo Ran, MD  Patient coming from: Home, lives with wife  I have personally briefly reviewed patient's old medical records in [facility]  Chief Complaint: Fall   HPI: [name] [name] is a 85 y.o. male with medical history significant of Parkinson's disease, mitral valve repair with history of endocarditis and mitral valve angioplasty in 2008, paroxysmal atrial fibrillation on Coumadin (previously on Eliquis but had inflammatory reaction), history of TIA, hyperlipidemia who presents s/p fall. Patient was walking out of bedroom and slipped on something and fell. He normally needs a cane or walker due to his tardive dyskinesia but was not using at the time of the fall. Hit his head on wood and had a laceration to his left ear. Denies any loss of consciousness. Denies any chest pain, shortness of breath.No pain to left hip or left ear at this time.   ED Course:  He was afebrile and normotensive on room air.  Mildly tachycardic up to 114.  CBC showed no leukocytosis or anemia.  Platelet is decreased down to 108.  BMP showed stable creatinine.  INR of 3.4. CT head showed no acute intracranial process.  Left shoulder x-ray show chronic rotator cuff disease.  Left hip x-ray showed acute displaced left intertrochanteric fracture.COVID test negative.   Review of Systems:  Constitutional: No Weight Change, No Fever ENT/Mouth: No sore throat, No Rhinorrhea Eyes: No Eye Pain, No Vision Changes Cardiovascular: No Chest Pain, no SOB Respiratory: No Cough, No Sputum, No Wheezing, no Dyspnea  Gastrointestinal: No Nausea, No Vomiting, No Diarrhea, No Constipation, No Pain Genitourinary: no Urinary Incontinence, No Urgency, No Flank Pain Musculoskeletal: No Arthralgias, No Myalgias Skin: No Skin Lesions, No Pruritus, Neuro: no Weakness, No Numbness,  No Loss of Consciousness, No Syncope Psych: No Anxiety/Panic, No  Depression, no decrease appetite Heme/Lymph: No Bruising, No Bleeding  Past Medical History:  Diagnosis Date  . Dyslipidemia   . Parkinson's disease (HCC)   . S/P mitral valve replacement    mitral regurg, endocarditis  . [facility]     Past Surgical History:  Procedure Laterality Date  . CATARACT EXTRACTION  2009, 2012   right 2009, left 2012  . LOOP RECORDER IMPLANT Left 03/13/2015   Procedure: LOOP RECORDER IMPLANT;  Surgeon: Marinus Maw, MD;  Location: [facility] CATH LAB;  Service: Cardiovascular;  Laterality: Left;  . MITRAL VALVE ANNULOPLASTY  04/10/2007    59mm Edwards ring; r/t h/o MR and flail mitral leaflets due to endocarditis: MRI safe  . TEE WITHOUT CARDIOVERSION N/A 03/13/2015   Procedure: TRANSESOPHAGEAL ECHOCARDIOGRAM (TEE);  Surgeon: Laurey Morale, MD;  Location: [facility] ENDOSCOPY;  Service: Cardiovascular;  Laterality: N/A;  . TRANSTHORACIC ECHOCARDIOGRAM  08/15/2012   EF=>55%; normal LV systolic function; RV mildly dilated & systolic function mildly reduced; RA mod dilated; mild -mod MR & mildy increased gradients; mild TR; AV mildly sclerotic with mild-mod regurg     reports that he has never smoked. He has never used smokeless tobacco. He reports current alcohol use of about 2.0 standard drinks of alcohol per week. He reports that he does not use drugs.  Allergies  Allergen Reactions  . Apixaban Palpitations, Rash and Other (See Comments)    Worsening tardive dyskinesia and rapid heart rate  . Ace Inhibitors Cough  . Demerol [Meperidine] Other (See Comments)    Parkinsons disease  . Poison Ivy Extract Itching, Swelling and Rash  Family History  Problem Relation Age of Onset  . Stroke Mother        cerebral hemorrahge  . Heart attack Mother   . Prostate cancer Father   . Heart attack Father   . Hypertension Brother   . Valvular heart disease Brother    Family history reviewed and not pertinent   Prior to Admission medications   Medication Sig Start Date  End Date Taking? Authorizing Provider  acetaminophen (TYLENOL) 500 MG tablet Take 500 mg by mouth every 6 (six) hours as needed for mild pain or headache.   Yes [provider]  amantadine (SYMMETREL) 100 MG capsule Take 100 mg by mouth See admin instructions. Take 100 mg by mouth at 1 PM and 100 mg at 6 PM   Yes [provider]  amoxicillin (AMOXIL) 500 MG tablet Take 1 tablet (500 mg total) by mouth 2 (two) times daily. Patient taking differently: Take 2,000 mg by mouth See admin instructions. Take 2,000 mg by mouth one hour prior to dental appointments 12/29/17  Yes End, Cristal Deer, MD  atorvastatin (LIPITOR) 20 MG tablet Take 20 mg by mouth every evening.  02/18/15  Yes [provider]  Cholecalciferol (VITAMIN D-3) 25 MCG (1000 UT) CAPS Take 1,000 Units by mouth daily.   Yes [provider]  donepezil (ARICEPT) 10 MG tablet Take 10 mg by mouth at bedtime.  08/02/19  Yes [provider]  escitalopram (LEXAPRO) 10 MG tablet Take 10 mg by mouth daily.   Yes [provider]  furosemide (LASIX) 20 MG tablet Take 1 tablet (20 mg total) by mouth daily. Patient taking differently: Take 10-20 mg by mouth See admin instructions. Take 10 mg by mouth once a day, alternating with 20 mg every other day 09/05/19 12/04/19 Yes Jodelle Gross, NP  metoprolol succinate (TOPROL-XL) 25 MG 24 hr tablet Take 12.5 mg by mouth daily.   Yes [provider]  Multiple Vitamins-Minerals (MULTIVITAMIN PO) Take 1 tablet by mouth daily.    Yes [provider]  rotigotine (NEUPRO) 4 MG/24HR Place 1 patch onto the skin daily.   Yes [provider]  RYTARY 48.75-195 MG CPCR Take 1-3 capsules by mouth See admin instructions. Take 3 capsules by mouth in the morning, 3 capsules between noon-1 PM, 2 capsules at 6 PM, and 1 capsule at bedtime 02/18/15  Yes [provider]  selegiline (ELDEPRYL) 5 MG tablet Take 2.5-5 mg by mouth See admin  instructions. Take 5 mg by mouth in the morning and 2.5 mg at 4 PM 06/15/19  Yes [provider]  vitamin B-12 (CYANOCOBALAMIN) 1000 MCG tablet Take 1,000 mcg by mouth 3 (three) times a week.    Yes [provider]  vitamin E 400 UNIT capsule Take 400 Units by mouth daily.   Yes [provider]  warfarin (COUMADIN) 5 MG tablet Take 1 tablet (5 mg total) by mouth daily. Patient taking differently: Take 2.5 mg by mouth at bedtime.  08/15/19  Yes Jodelle Gross, NP  docusate sodium (COLACE) 100 MG capsule Take 100 mg by mouth daily as needed for mild constipation.    [provider]  Metoprolol Succinate 25 MG CS24 Take 12.5 mg by mouth daily. Patient not taking: Reported on 09/12/2019 08/20/19   Chrystie Nose, MD    Physical Exam: Vitals:   09/12/19 1937 09/12/19 1946 09/12/19 2300 09/13/19 0108  BP:   118/77 (!) 107/96  Pulse:   86 88  Resp:   20 (!) 26  Temp:  99.5 F (37.5 C)    TempSrc:  Oral    SpO2: 94%  93% 97%    Constitutional: NAD, calm, comfortable well appearing male laying in bed with eyes closed and clean bandage wrapped around his head.  Vitals:   09/12/19 1937 09/12/19 1946 09/12/19 2300 09/13/19 0108  BP:   118/77 (!) 107/96  Pulse:   86 88  Resp:   20 (!) 26  Temp:  99.5 F (37.5 C)    TempSrc:  Oral    SpO2: 94%  93% 97%   Eyes: PERRL, lids and conjunctivae normal ENMT: Mucous membranes are moist. Posterior pharynx clear of any exudate or lesions.Normal dentition.  Neck: normal, supple, no masses Respiratory: clear to auscultation bilaterally, no wheezing, no crackles. Normal respiratory effort. No accessory muscle use.  Cardiovascular: Regular rate and rhythm, no murmurs / rubs / gallops. No extremity edema. 2+ pedal pulses. Healed sternal surgical scar. Abdomen: no tenderness, no masses palpated. Bowel sounds positive.  Musculoskeletal: no clubbing / cyanosis. No joint deformity upper and lower extremities. Good ROM,  no contractures. Normal muscle tone.  Skin: no rashes, lesions, ulcers. No induration Neurologic: CN 2-12 grossly intact. Frequent involuntary movements of the Upper and lower extremity secondary to tardive dyskinesia lower extremity.Sensation intact. 5/5 strength in upper extremity. Strength 4/5 in right lower extremity. Unable to move left lower extremity due to hip fracture.  Psychiatric: Normal judgment and insight. Alert and oriented x 3. Normal mood.    Labs on Admission: I have personally reviewed following labs and imaging studies  CBC: Recent Labs  Lab 09/10/19 1042 09/12/19 1925  WBC 4.8 7.8  HGB 15.0 14.8  HCT 44.9 45.6  MCV 95 98.5  PLT 120* 182*   Basic Metabolic Panel: Recent Labs  Lab 09/10/19 1042 09/12/19 1925  NA 143 139  K 4.7 4.0  CL 102 103  CO2 27 27  GLUCOSE 104* 139*  BUN 26 26*  CREATININE 1.08 1.20  CALCIUM 9.0 9.0   GFR: Estimated Creatinine Clearance: 43.5 mL/min (by C-G formula based on SCr of 1.2 mg/dL). Liver Function Tests: No results for input(s): AST, ALT, ALKPHOS, BILITOT, PROT, ALBUMIN in the last 168 hours. No results for input(s): LIPASE, AMYLASE in the last 168 hours. No results for input(s): AMMONIA in the last 168 hours. Coagulation Profile: Recent Labs  Lab 09/10/19 0959 09/12/19 1925  INR 3.2* 3.4*   Cardiac Enzymes: No results for input(s): CKTOTAL, CKMB, CKMBINDEX, TROPONINI in the last 168 hours. BNP (last 3 results) No results for input(s): PROBNP in the last 8760 hours. HbA1C: No results for input(s): HGBA1C in the last 72 hours. CBG: No results for input(s): GLUCAP in the last 168 hours. Lipid Profile: No results for input(s): CHOL, HDL, LDLCALC, TRIG, CHOLHDL, LDLDIRECT in the last 72 hours. Thyroid Function Tests: No results for input(s): TSH, T4TOTAL, FREET4, T3FREE, THYROIDAB in the last 72 hours. Anemia Panel: No results for input(s): VITAMINB12, FOLATE, FERRITIN, TIBC, IRON, RETICCTPCT in the last 72  hours. Urine analysis: No results found for: COLORURINE, APPEARANCEUR, LABSPEC, [place], GLUCOSEU, HGBUR, BILIRUBINUR, KETONESUR, PROTEINUR, UROBILINOGEN, NITRITE, LEUKOCYTESUR  Radiological Exams on Admission: Ct Head Wo Contrast  Result Date: 09/12/2019 CLINICAL DATA:  Fall with head pain EXAM: CT HEAD WITHOUT CONTRAST TECHNIQUE: Contiguous axial images were obtained from the base of the skull through the vertex without intravenous contrast. COMPARISON:  None. FINDINGS: Brain: No evidence of acute infarction, hemorrhage, hydrocephalus,  extra-axial collection or mass lesion/mass effect. There is mild cerebral volume loss with associated ex vacuo dilatation. Periventricular and subcortical white matter hypoattenuation likely represents chronic small vessel ischemic disease. Vascular: There are vascular calcifications in the carotid siphons. Skull: Normal. Negative for fracture or focal lesion. Sinuses/Orbits: There is frontal and ethmoid sinus disease. Other: None. IMPRESSION: No acute intracranial process. Electronically Signed   By: Romona Curlsyler  Litton M.D.   On: 09/12/2019 21:16   Dg Shoulder Left  Result Date: 09/12/2019 CLINICAL DATA:  Fall EXAM: LEFT SHOULDER - 2+ VIEW COMPARISON:  None. FINDINGS: No fracture or dislocation. High-riding humeral head. AC joint degenerative change IMPRESSION: 1. No acute osseous abnormality 2. High-riding humeral head, consistent with rotator cuff disease Electronically Signed   By: Jasmine PangKim  Fujinaga M.D.   On: 09/12/2019 20:15   Dg Hip Unilat With Pelvis 2-3 Views Left  Result Date: 09/12/2019 CLINICAL DATA:  Hip pain status post fall EXAM: DG HIP (WITH OR WITHOUT PELVIS) 2-3V LEFT COMPARISON:  None. FINDINGS: Pubic symphysis and rami are intact. Both femoral heads project in joint. Mild degenerative changes bilaterally. Acute displaced left trochanteric fracture. IMPRESSION: Acute displaced left trochanteric fracture Electronically Signed   By: Jasmine PangKim  Fujinaga M.D.   On:  09/12/2019 20:15    EKG: Independently reviewed.   Assessment/Plan  Left displaced intertrochanteric fracture s/p mechanical fall - ortho- consulted. Planned hip operative fixature depending on INR - PRN Hydrocodone for moderate pain, PRN morphine for severe pain  Left ear laceration - injury of superior pinna from fall. S/p repair in the ED with 5 sutures.  - wound care per RN. Apply Ice to area.   Supratherapeutic INR - INR 3.4. Last dose at 6pm 10/21 - Hold coumadin for anticipated surgery  Paroxysmal atrial fibrillation -Holding coumadin due to supratherapeutic INR.  -Will need to consult cardiology regarding bridging once he gets closer to being subtherapeutic. CHA2Ds2VAsc of 4 (age, TIA hx)   Chronic systolic dysfunction  - continue metoprolol and Lasix   History of mitral valve repair with angioplasty - recent echocardiogram shows severe mitral valve regurgitation -Outpatient cardiology had plans for repeat echo but wife and patient decided to cancel since he does not want any further repairs  Parkinson's disease -Continue Aricept, Lexapro, amantadine - continue Neupro patch  - continue carbidopa/Levodopa - continue selegiline   [name] Mallen T Sejla Marzano DO [facility]   If 7PM-7AM, please contact night-coverage [email or web address] Password [email or web address]  09/13/2019, 1:39 AM

## 2019-09-13 NOTE — Consult Note (Addendum)
Cardiology Consultation:   Patient ID: [name] [name] MRN: [id number]; DOB: 04/20/1932  Admit date: 09/12/2019 Date of Consult: 09/13/2019  Primary Care Provider: Rodrigo RanPerini, Mark, MD Primary Cardiologist: Chrystie NoseKenneth C Hilty, MD  Primary Electrophysiologist:  None    Patient Profile:   [name] [name] is a 85 y.o. male with a hx of endocarditis s/p mitral valve repair in 2008, cryptogenic stroke with subsequent discover of paroxysmal Afib by implantable loop recorder, hyperlipidemia, and Parkinson's disease who is being seen today for the evaluation of pre-operative evaluation for left intertrochanteric fx repair at the request of Dr. Blake DivineAkula.  History of Present Illness:   [name] [name] regularly follows with Dr. Rennis [name] for the above cardiac issue. History includes MR and endocarditis in 2008 resulting in mitral valve repair and annuloplastypasty ring. He has a history of atrial fibrillation found on a loop recorder after he had a cryptogenic stroke in 2016. Since then he has been on warfarin and follows closely with [name] [name]. Patient is asymptomatic afib. Echo in 2016 showed a stable MV repair and normal LV function. In 2018 loop recorder lost battery life and patient did not elect to have it explanted. Over the years Parkinson's symptoms have worsened with bilateral leg weakness. Repeat echo in January 2020 showed normal systolic function, moderate to severe atrial enlargement, and a stable valve gradient. In August 2020 patient was seen and INRs were noted to be labile. Due to change in 2019 guidelines warfarin was switched to Eliquis. On follow up in September he was noted to have a rash and worsened tardive dyskinesia so Eliquis was stopped and he was restarted on coumadin. At his follow up on 10/2 he was placed on Lasix because BNP was noted to be high and he had some lower leg edema on exam. Echo was ordered showed EF 45-50% with mildly decreased function, mildly increased left ventricular size with no  ventricular hypertrophy, mild diffuse hypokinesis, might be severe mitral regurgitation, eccentric and posteriorly directed, the mean was 7 mmHg but MVA was normal by PHT (elevated thought to be due to MR rather than MS). He was seen again 10/14 and was stable Lasix 20 mg daily and Metoprolol were continued. Dr. Rennis [name] scheduled patient for TEE for further evaluation of mitral valve.   On  09/12/19 the patient presented to the ED for a fall that occurred that morning. He reportedly got out of bed and tripped on a rug from turning too fast. He denied dizziness or lightheadedness prior to the fall. He was noted to have a laceration to the ear as well. He also had left hip and shoulder pain. He did not have LOC. Vitals in the ED were 118/77, 99.5 temp, RR 20, 93% O2. Labs revealed potassium 4.0, glucose 139, creatinine 1.2. WBC 7.8, Hgb 14.8, platelets 108. INR was 3.4. X-rays showed acute displaced left trochantaric fracture. Patient was admitted and orthopedics was consulted, they plan to repair tomorrow. Patient was made NPO and coumadin was stopped. The next day INR was up to 3.8 and Vitamin K was given.   The pt was functional prior   Able to walk a flight of stairs, do some chores around the house  Heart Pathway Score:     Past Medical History:  Diagnosis Date   Dyslipidemia    Parkinson's disease (HCC)    S/P mitral valve replacement    mitral regurg, endocarditis   Stroke [facility](HCC)     Past Surgical History:  Procedure Laterality Date   CATARACT  EXTRACTION  2009, 2012   right 2009, left 2012   LOOP RECORDER IMPLANT Left 03/13/2015   Procedure: LOOP RECORDER IMPLANT;  Surgeon: Marinus Maw, MD;  Location: [facility] CATH LAB;  Service: Cardiovascular;  Laterality: Left;   MITRAL VALVE ANNULOPLASTY  04/10/2007    26mm Edwards ring; r/t h/o MR and flail mitral leaflets due to endocarditis: MRI safe   TEE WITHOUT CARDIOVERSION N/A 03/13/2015   Procedure: TRANSESOPHAGEAL ECHOCARDIOGRAM (TEE);   Surgeon: Laurey Morale, MD;  Location: [facility] ENDOSCOPY;  Service: Cardiovascular;  Laterality: N/A;   TRANSTHORACIC ECHOCARDIOGRAM  08/15/2012   EF=>55%; normal LV systolic function; RV mildly dilated & systolic function mildly reduced; RA mod dilated; mild -mod MR & mildy increased gradients; mild TR; AV mildly sclerotic with mild-mod regurg     Home Medications:  Prior to Admission medications   Medication Sig Start Date End Date Taking? Authorizing Provider  acetaminophen (TYLENOL) 500 MG tablet Take 500 mg by mouth every 6 (six) hours as needed for mild pain or headache.   Yes [provider]  amantadine (SYMMETREL) 100 MG capsule Take 100 mg by mouth See admin instructions. Take 100 mg by mouth at 1 PM and 100 mg at 6 PM   Yes [provider]  amoxicillin (AMOXIL) 500 MG tablet Take 1 tablet (500 mg total) by mouth 2 (two) times daily. Patient taking differently: Take 2,000 mg by mouth See admin instructions. Take 2,000 mg by mouth one hour prior to dental appointments 12/29/17  Yes End, Cristal Deer, MD  atorvastatin (LIPITOR) 20 MG tablet Take 20 mg by mouth every evening.  02/18/15  Yes [provider]  Cholecalciferol (VITAMIN D-3) 25 MCG (1000 UT) CAPS Take 1,000 Units by mouth daily.   Yes [provider]  donepezil (ARICEPT) 10 MG tablet Take 10 mg by mouth at bedtime.  08/02/19  Yes [provider]  escitalopram (LEXAPRO) 10 MG tablet Take 10 mg by mouth daily.   Yes [provider]  furosemide (LASIX) 20 MG tablet Take 1 tablet (20 mg total) by mouth daily. Patient taking differently: Take 10-20 mg by mouth See admin instructions. Take 10 mg by mouth once a day, alternating with 20 mg every other day 09/05/19 12/04/19 Yes Jodelle Gross, NP  metoprolol succinate (TOPROL-XL) 25 MG 24 hr tablet Take 12.5 mg by mouth daily.   Yes [provider]  Multiple Vitamins-Minerals (MULTIVITAMIN PO) Take 1 tablet by mouth daily.    Yes  [provider]  rotigotine (NEUPRO) 4 MG/24HR Place 1 patch onto the skin daily.   Yes [provider]  RYTARY 48.75-195 MG CPCR Take 1-3 capsules by mouth See admin instructions. Take 3 capsules by mouth in the morning, 3 capsules between noon-1 PM, 2 capsules at 6 PM, and 1 capsule at bedtime 02/18/15  Yes [provider]  selegiline (ELDEPRYL) 5 MG tablet Take 2.5-5 mg by mouth See admin instructions. Take 5 mg by mouth in the morning and 2.5 mg at 4 PM 06/15/19  Yes [provider]  vitamin B-12 (CYANOCOBALAMIN) 1000 MCG tablet Take 1,000 mcg by mouth 3 (three) times a week.    Yes [provider]  vitamin E 400 UNIT capsule Take 400 Units by mouth daily.   Yes [provider]  warfarin (COUMADIN) 5 MG tablet Take 1 tablet (5 mg total) by mouth daily. Patient taking differently: Take 2.5 mg by mouth at bedtime.  08/15/19  Yes Jodelle Gross, NP  docusate sodium (COLACE) 100 MG capsule Take 100 mg by mouth daily as needed for mild constipation.    [provider]  Metoprolol Succinate 25 MG CS24 Take 12.5 mg by mouth daily. Patient not taking: Reported on 09/12/2019 08/20/19   Chrystie Nose, MD    Inpatient Medications: Scheduled Meds:  amantadine  100 mg Oral BID   atorvastatin  20 mg Oral QPM   Carbidopa-Levodopa ER  1-3 capsule Oral See admin instructions   donepezil  10 mg Oral QHS   escitalopram  10 mg Oral Daily   furosemide  10 mg Oral Daily   metoprolol succinate  12.5 mg Oral Daily   rotigotine  1 patch Transdermal Daily   selegiline  5 mg Oral QAC breakfast   And   selegiline  2.5 mg Oral QAC supper   Continuous Infusions:  sodium chloride Stopped (09/13/19 0550)   PRN Meds: acetaminophen, bisacodyl, HYDROcodone-acetaminophen, morphine injection  Allergies:    Allergies  Allergen Reactions   Apixaban Palpitations, Rash and Other (See Comments)    Worsening tardive dyskinesia and rapid  heart rate   Ace Inhibitors Cough   Demerol [Meperidine] Other (See Comments)    Parkinsons disease   Poison Ivy Extract Itching, Swelling and Rash    Social History:   Social History   Socioeconomic History   Marital status: Married    Spouse name: Not on file   Number of children: 3   Years of education: law school   Highest education level: Not on file  Occupational History   Occupation: retired  Ecologist strain: Not on file   Food insecurity    Worry: Not on file    Inability: Not on Occupational hygienist needs    Medical: Not on file    Non-medical: Not on file  Tobacco Use   Smoking status: Never Smoker   Smokeless tobacco: Never Used  Substance and Sexual Activity   Alcohol use: Yes    Alcohol/week: 2.0 standard drinks    Types: 1 Glasses of wine, 1 Cans of beer per week    Comment: 1 can of beer or 1 wine every day   Drug use: No   Sexual activity: Not on file  Lifestyle   Physical activity    Days per week: Not on file    Minutes per session: Not on file   Stress: Not on file  Relationships   Social connections    Talks on phone: Not on file    Gets together: Not on file    Attends religious service: Not on file    Active member of club or organization: Not on file    Attends meetings of clubs or organizations: Not on file    Relationship status: Not on file   Intimate partner violence    Fear of current or ex partner: Not on file    Emotionally abused: Not on file    Physically abused: Not on file    Forced sexual activity: Not on file  Other Topics Concern   Not on file  Social History Narrative   Not on file    Family History:   Family History  Problem Relation Age of Onset   Stroke Mother        cerebral hemorrahge   Heart attack Mother    Prostate cancer Father    Heart attack Father    Hypertension Brother    Valvular heart disease Brother  ROS:  Please see the history of  present illness.  All other ROS reviewed and negative.     Physical Exam/Data:   Vitals:   09/13/19 0700 09/13/19 0800 09/13/19 1001 09/13/19 1100  BP: 102/78 119/85 (!) 125/92 (!) 129/97  Pulse: (!) 105 74 (!) 105 83  Resp: 16 (!) 21 17 16   Temp:      TempSrc:      SpO2: 95% 95% 93% (!) 89%    Intake/Output Summary (Last 24 hours) at 09/13/2019 1402 Last data filed at 09/13/2019 1100 Gross per 24 hour  Intake 50 ml  Output --  Net 50 ml   Last 3 Weights 09/05/2019 08/24/2019 08/15/2019  Weight (lbs) 169 lb 12.8 oz 177 lb 9.6 oz 179 lb  Weight (kg) 77.021 kg 80.559 kg 81.194 kg     There is no height or weight on file to calculate BMI.  General:  Well nourished, well developed, in no acute distress HEENT: normal Lymph: no adenopathy Neck: no JVD Endocrine:  No thryomegaly Vascular: No carotid bruits; FA pulses 2+ bilaterally without bruits  Cardiac:  normal S1, S2; RRR; systolic murmur  Lungs:  7-3A O2; clear to auscultation bilaterally, no wheezing, rhonchi or rales  Abd: soft, nontender, no hepatomegaly  Ext: no edema Musculoskeletal:  No deformities, BUE and BLE strength normal and equal Skin: warm and dry  Neuro:  CNs 2-12 intact, no focal abnormalities noted Psych:  Normal affect   EKG:  The EKG was personally reviewed and demonstrates:  NSR, 86 bpm, poor r wave progression, repolarization abnormalities Telemetry:  Telemetry was personally reviewed and demonstrates:  N/A Relevant CV Studies:  Echo 08/2019  1. Left ventricular ejection fraction, by visual estimation, is 45 to 50%. The left ventricle has mildly decreased function. Mildly increased left ventricular size. There is no left ventricular hypertrophy. Mild diffuse hypokinesis.  2. Left ventricular diastolic Doppler parameters are indeterminate pattern of LV diastolic filling.  3. Global right ventricle has moderately reduced systolic function.The right ventricular size is mildly enlarged. No increase in  right ventricular wall thickness.  4. The patient is status post mitral valve repair. There appears to be severe mitral regurgitation, eccentric and posteriorly-directed. The mean gradient was 7 mmHg but MVA was normal by PHT, I think that the elevated mean gradient is due to high flow  from mitral regurgitation rather than significant mitral stenosis.  5. The aortic valve is tricuspid Aortic valve regurgitation is mild by color flow Doppler. Mild aortic valve sclerosis without stenosis.  6. There is mild dilatation of the aortic root measuring 39 mm.  7. The tricuspid valve is normal in structure. Tricuspid valve regurgitation moderate.  8. Left atrial size was severely dilated.  9. Right atrial size was severely dilated. 10. The IVC was not visualized. Peak RV-RA gradient 28 mmHg.  Echo 11/2018 1. The left ventricle appears to be normal in size, have mild wall thickness, with 60-65%. Echo evidence of normal in diastolic filling patterns.  2. Right ventricular systolic pressure is is mildly elevated.  3. The right ventricle is normal in size, has moderately increased wall thickness and normal systolic function.  4. Severely dilated left atrial size.  5. Moderately dilated right atrial size.  6. A s/p repair with 4mm annuloplasty ring valve is present in the mitral position.  7. Mitral valve regurgitation is mild to moderate by color flow Doppler.  8. The mitral valve is degenerative.  9. Normal tricuspid valve. 10. Tricuspid regurgitation  is mild. 11. Aortic valve tricuspid. 12. There is moderate thickening of the aortic valve. 13. No atrial level shunt detected by color flow Doppler.   Laboratory Data:  High Sensitivity Troponin:  No results for input(s): TROPONINIHS in the last 720 hours.   Chemistry Recent Labs  Lab 09/10/19 1042 09/12/19 1925  NA 143 139  K 4.7 4.0  CL 102 103  CO2 27 27  GLUCOSE 104* 139*  BUN 26 26*  CREATININE 1.08 1.20  CALCIUM 9.0 9.0  GFRNONAA 62  55*  GFRAA 72 >60  ANIONGAP  --  9    No results for input(s): PROT, ALBUMIN, AST, ALT, ALKPHOS, BILITOT in the last 168 hours. Hematology Recent Labs  Lab 09/10/19 1042 09/12/19 1925  WBC 4.8 7.8  RBC 4.74 4.63  HGB 15.0 14.8  HCT 44.9 45.6  MCV 95 98.5  MCH 31.6 32.0  MCHC 33.4 32.5  RDW 13.9 16.5*  PLT 120* 108*   BNPNo results for input(s): BNP, PROBNP in the last 168 hours.  DDimer No results for input(s): DDIMER in the last 168 hours.   Radiology/Studies:  Ct Head Wo Contrast  Result Date: 09/12/2019 CLINICAL DATA:  Fall with head pain EXAM: CT HEAD WITHOUT CONTRAST TECHNIQUE: Contiguous axial images were obtained from the base of the skull through the vertex without intravenous contrast. COMPARISON:  None. FINDINGS: Brain: No evidence of acute infarction, hemorrhage, hydrocephalus, extra-axial collection or mass lesion/mass effect. There is mild cerebral volume loss with associated ex vacuo dilatation. Periventricular and subcortical white matter hypoattenuation likely represents chronic small vessel ischemic disease. Vascular: There are vascular calcifications in the carotid siphons. Skull: Normal. Negative for fracture or focal lesion. Sinuses/Orbits: There is frontal and ethmoid sinus disease. Other: None. IMPRESSION: No acute intracranial process. Electronically Signed   By: Romona Curls M.D.   On: 09/12/2019 21:16   Dg Shoulder Left  Result Date: 09/12/2019 CLINICAL DATA:  Fall EXAM: LEFT SHOULDER - 2+ VIEW COMPARISON:  None. FINDINGS: No fracture or dislocation. High-riding humeral head. AC joint degenerative change IMPRESSION: 1. No acute osseous abnormality 2. High-riding humeral head, consistent with rotator cuff disease Electronically Signed   By: Jasmine Pang M.D.   On: 09/12/2019 20:15   Dg Hip Unilat With Pelvis 2-3 Views Left  Result Date: 09/12/2019 CLINICAL DATA:  Hip pain status post fall EXAM: DG HIP (WITH OR WITHOUT PELVIS) 2-3V LEFT COMPARISON:   None. FINDINGS: Pubic symphysis and rami are intact. Both femoral heads project in joint. Mild degenerative changes bilaterally. Acute displaced left trochanteric fracture. IMPRESSION: Acute displaced left trochanteric fracture Electronically Signed   By: Jasmine Pang M.D.   On: 09/12/2019 20:15    Assessment and Plan:   1  Preo-op cardiac risk assessment Pt is an 85 yo wth hx of MV dz (s/p MV repair) and atrial fibrillation   Patient presented yesterday after a mechanical fall earlier that morning found to have left hip fracture. Orthopedics plan to do surgery tomorrow. Coumadin was held in preparation for surgery 1  Pt has a history of CHF (LVEF 45 to 50%), atrial fib, CVAand severe MR.     Prior to this patient was functional, able to perform light  work around the house and even some yard work. He is able to walk a flight of stairs as well as 1 block.  - According to Duke Activity Status Index Mets 5.62 - According to Revised Caridac risk index , 30 day risk 10% for death,  MI, or cardiac arrest  He is at moderate risk fr a periop cardiac complications, most likely with worsening CHF From a cardiac standpoint, nothing else would optimize Would recomm: Continue tele  Can be dosed with IV meds if necessary to control HR Watch I/O closely     Diurese as needed  Watch BP closely  - INR was 3.4. 3.8 today and Vitamin K was given.   2  Atrial fibrillation. Paroxysmal   PT has chronic atrial fibrillation   Had CVA in past, probable embolic Will need to have bridging given this history   3  Hx MV dz   Last echo Oct 8 with severe eccentric MR (posteriorly directed)  Plan was for TEE in the next several days   Will ned to defer for now      New cardiomyopathy  - Recent echo showed new decreased EF 45-50% and possible worsening mitral valve insufficiency. Echo in January 2020 showed EF 60-65%  Pt without CP  - Patient was having some lower leg swelling and sob improved since starting lasix 1  month ago - Appears euvolemic on exam though sats low on RA  - Continue BB  Hyperlipidemia - Lipitor 20 mg daily - No recent LDL on records  For questions or updates, please contact [facility] Please consult [email or web address] for contact info under     Signed, Cadence David Stall, PA-C  09/13/2019 2:02 PM   Pt seen and exmained  I have amended note above by Peggyann Juba to reflect findings  PT is a frail appearing 85 yo though reported active Neck:  JVP is normal Lungs are rel clear Cardiac RRR   NO S3  Gr III /VI systolic murmur APex Abd suppler Ext are with trivial edema Skin with bruises L side (head, chest)  IMpression  Pt is at moderate increased risk for periop cardovascular complication .   Not prohibitive Need to follow volume status (I/O) and HR / rhythm closely   Diruese as needed   IV meds for rate control Given hx of CVA in past will review with pharmacy   Should have bridging after develops any afib   Continue tele  WIll continue to follow in periop period.  Dietrich Pates MD 09/13/2019

## 2019-09-13 NOTE — ED Notes (Signed)
Report given to Randy, RN on floor. 

## 2019-09-13 NOTE — ED Notes (Signed)
Tele ordered lunch 

## 2019-09-13 NOTE — ED Notes (Addendum)
PT resting- no distress noted. Attempted report

## 2019-09-13 NOTE — ED Notes (Signed)
Tray ordered.

## 2019-09-13 NOTE — Consult Note (Addendum)
Reason for Consult:Left hip fx Referring Physician: V Adian [name] is an 85 y.o. male.  HPI: [name] [name] missed the last step in his house and fell. He had immediate left hip and shoulder pain and could not get up. He was brought to the ED where x-rays showed a left intertroch fx and orthopedic surgery was consulted. Surgery was necessarily delayed given his therapeutic INR and orthopedic trauma specialist was asked to consult. He c/o localized pain to the hip; the shoulder pain has subsided. He lives at home with his wife and usually ambulates with either a RW or a cane.  Past Medical History:  Diagnosis Date  . Dyslipidemia   . Parkinson's disease ([place])   . S/P mitral valve replacement    mitral regurg, endocarditis  . Stroke [facility])     Past Surgical History:  Procedure Laterality Date  . CATARACT EXTRACTION  2009, 2012   right 2009, left 2012  . LOOP RECORDER IMPLANT Left 03/13/2015   Procedure: LOOP RECORDER IMPLANT;  Surgeon: Evans Lance, MD;  Location: [facility] CATH LAB;  Service: Cardiovascular;  Laterality: Left;  . MITRAL VALVE ANNULOPLASTY  04/10/2007    38mm Edwards ring; r/t h/o MR and flail mitral leaflets due to endocarditis: MRI safe  . TEE WITHOUT CARDIOVERSION N/A 03/13/2015   Procedure: TRANSESOPHAGEAL ECHOCARDIOGRAM (TEE);  Surgeon: Larey Dresser, MD;  Location: [place];  Service: Cardiovascular;  Laterality: N/A;  . TRANSTHORACIC ECHOCARDIOGRAM  08/15/2012   EF=>55%; normal LV systolic function; RV mildly dilated & systolic function mildly reduced; RA mod dilated; mild -mod MR & mildy increased gradients; mild TR; AV mildly sclerotic with mild-mod regurg    Family History  Problem Relation Age of Onset  . Stroke Mother        cerebral hemorrahge  . Heart attack Mother   . Prostate cancer Father   . Heart attack Father   . Hypertension Brother   . Valvular heart disease Brother     Social History:  reports that he has never smoked. He has never used smokeless  tobacco. He reports current alcohol use of about 2.0 standard drinks of alcohol per week. He reports that he does not use drugs.  Allergies:  Allergies  Allergen Reactions  . Apixaban Palpitations, Rash and Other (See Comments)    Worsening tardive dyskinesia and rapid heart rate  . Ace Inhibitors Cough  . Demerol [Meperidine] Other (See Comments)    Parkinsons disease  . Poison Ivy Extract Itching, Swelling and Rash    Medications: I have reviewed the patient's current medications.  Results for orders placed or performed during the hospital encounter of 09/12/19 (from the past 48 hour(s))  Basic metabolic panel     Status: Abnormal   Collection Time: 09/12/19  7:25 PM  Result Value Ref Range   Sodium 139 135 - 145 mmol/L   Potassium 4.0 3.5 - 5.1 mmol/L   Chloride 103 98 - 111 mmol/L   CO2 27 22 - 32 mmol/L   Glucose, Bld 139 (H) 70 - 99 mg/dL   BUN 26 (H) 8 - 23 mg/dL   Creatinine, Ser 1.20 0.61 - 1.24 mg/dL   Calcium 9.0 8.9 - 10.3 mg/dL   GFR calc non Af Amer 55 (L) >60 mL/min   GFR calc Af Amer >60 >60 mL/min   Anion gap 9 5 - 15    Comment: Performed at [facility] [address] [place], [place] [address]  CBC  Status: Abnormal   Collection Time: 09/12/19  7:25 PM  Result Value Ref Range   WBC 7.8 4.0 - 10.5 K/uL   RBC 4.63 4.22 - 5.81 MIL/uL   Hemoglobin 14.8 13.0 - 17.0 g/dL   HCT 80.9 98.3 - 38.2 %   MCV 98.5 80.0 - 100.0 fL   MCH 32.0 26.0 - 34.0 pg   MCHC 32.5 30.0 - 36.0 g/dL   RDW 50.5 (H) 39.7 - 67.3 %   Platelets 108 (L) 150 - 400 K/uL    Comment: REPEATED TO VERIFY PLATELET COUNT CONFIRMED BY SMEAR Immature Platelet Fraction may be clinically indicated, consider ordering this additional test [id number]    nRBC 0.0 0.0 - 0.2 %    Comment: Performed at [facility], [address], [place], [place] [address]  Protime-INR     Status: Abnormal   Collection Time: 09/12/19  7:25 PM  Result Value Ref Range   Prothrombin Time 33.7 (H)  11.4 - 15.2 seconds   INR 3.4 (H) 0.8 - 1.2    Comment: (NOTE) INR goal varies based on device and disease states. Performed at [facility], [address] [place], [place] [address]   SARS CORONAVIRUS 2 (TAT 6-24 HRS) Nasopharyngeal Nasopharyngeal Swab     Status: None   Collection Time: 09/12/19  8:44 PM   Specimen: Nasopharyngeal Swab  Result Value Ref Range   SARS Coronavirus 2 NEGATIVE NEGATIVE    Comment: (NOTE) SARS-CoV-2 target nucleic acids are NOT DETECTED. The SARS-CoV-2 RNA is generally detectable in upper and lower respiratory specimens during the acute phase of infection. Negative results do not preclude SARS-CoV-2 infection, do not rule out co-infections with other pathogens, and should not be used as the sole basis for treatment or other patient management decisions. Negative results must be combined with clinical observations, patient history, and epidemiological information. The expected result is Negative. Fact Sheet for Patients: HairSlick.no Fact Sheet for Healthcare Providers: quierodirigir.com This test is not yet approved or cleared by the [place] FDA and  has been authorized for detection and/or diagnosis of SARS-CoV-2 by FDA under an Emergency Use Authorization (EUA). This EUA will remain  in effect (meaning this test can be used) for the duration of the COVID-19 declaration under Section 56 4(b)(1) of the Act, 21 U.S.C. section 360bbb-3(b)(1), unless the authorization is terminated or revoked sooner. Performed at [facility], [address], [place], [place] [address]   Protime-INR     Status: Abnormal   Collection Time: 09/13/19  5:20 AM  Result Value Ref Range   Prothrombin Time 36.8 (H) 11.4 - 15.2 seconds   INR 3.8 (H) 0.8 - 1.2    Comment: (NOTE) INR goal varies based on device and disease states. Performed at [facility], [address] [place],  [place] [address]     Ct Head Wo Contrast  Result Date: 09/12/2019 CLINICAL DATA:  Fall with head pain EXAM: CT HEAD WITHOUT CONTRAST TECHNIQUE: Contiguous axial images were obtained from the base of the skull through the vertex without intravenous contrast. COMPARISON:  None. FINDINGS: Brain: No evidence of acute infarction, hemorrhage, hydrocephalus, extra-axial collection or mass lesion/mass effect. There is mild cerebral volume loss with associated ex vacuo dilatation. Periventricular and subcortical white matter hypoattenuation likely represents chronic small vessel ischemic disease. Vascular: There are vascular calcifications in the carotid siphons. Skull: Normal. Negative for fracture or focal lesion. Sinuses/Orbits: There is frontal and ethmoid sinus disease.  Other: None. IMPRESSION: No acute intracranial process. Electronically Signed   By: Romona Curlsyler  Litton M.D.   On: 09/12/2019 21:16   Dg Shoulder Left  Result Date: 09/12/2019 CLINICAL DATA:  Fall EXAM: LEFT SHOULDER - 2+ VIEW COMPARISON:  None. FINDINGS: No fracture or dislocation. High-riding humeral head. AC joint degenerative change IMPRESSION: 1. No acute osseous abnormality 2. High-riding humeral head, consistent with rotator cuff disease Electronically Signed   By: Jasmine PangKim  Fujinaga M.D.   On: 09/12/2019 20:15   Dg Hip Unilat With Pelvis 2-3 Views Left  Result Date: 09/12/2019 CLINICAL DATA:  Hip pain status post fall EXAM: DG HIP (WITH OR WITHOUT PELVIS) 2-3V LEFT COMPARISON:  None. FINDINGS: Pubic symphysis and rami are intact. Both femoral heads project in joint. Mild degenerative changes bilaterally. Acute displaced left trochanteric fracture. IMPRESSION: Acute displaced left trochanteric fracture Electronically Signed   By: Jasmine PangKim  Fujinaga M.D.   On: 09/12/2019 20:15    Review of Systems  Constitutional: Negative for weight loss.  HENT: Negative for ear discharge, ear pain, hearing loss and tinnitus.   Eyes: Negative for blurred vision,  double vision, photophobia and pain.  Respiratory: Negative for cough, sputum production and shortness of breath.   Cardiovascular: Negative for chest pain.  Gastrointestinal: Negative for abdominal pain, nausea and vomiting.  Genitourinary: Negative for dysuria, flank pain, frequency and urgency.  Musculoskeletal: Positive for joint pain (Left hip). Negative for back pain, falls, myalgias and neck pain.  Neurological: Negative for dizziness, tingling, sensory change, focal weakness, loss of consciousness and headaches.  Endo/Heme/Allergies: Does not bruise/bleed easily.  Psychiatric/Behavioral: Negative for depression, memory loss and substance abuse. The patient is not nervous/anxious.    Blood pressure 119/85, pulse 74, temperature 99.5 F (37.5 C), temperature source Oral, resp. rate (!) 21, SpO2 95 %. Physical Exam  Constitutional: He appears well-developed and well-nourished. No distress.  HENT:  Head: Normocephalic and atraumatic.  Eyes: Conjunctivae are normal. Right eye exhibits no discharge. Left eye exhibits no discharge. No scleral icterus.  Neck: Normal range of motion.  Cardiovascular: Normal rate and regular rhythm.  Respiratory: Effort normal. No respiratory distress.  Musculoskeletal:     Comments: LLE No traumatic wounds, ecchymosis, or rash  Mod TTP hip  No knee or ankle effusion  Knee stable to varus/ valgus and anterior/posterior stress  Sens DPN, SPN, TN intact  Motor EHL, ext, flex, evers 5/5  DP 2+, PT 0, No significant edema  Neurological: He is alert.  Skin: Skin is warm and dry. He is not diaphoretic.  Psychiatric: He has a normal mood and affect. His behavior is normal.    Assessment/Plan: Left hip fx -- Plan IMN tomorrow with Dr. Jena GaussHaddix. NPO after MN. I spoke with wife and updated her on plan. Afib on coumadin -- Will give vitamin K and recheck INR tonight.  Multiple medical problems including Parkinson's disease, mitral valve repair with history of  endocarditis and mitral valve angioplasty in 2008, history of TIA, and hyperlipidemia -- per primary service    [name] CaldronMichael J. Aryannah Mohon, PA-C Orthopedic Surgery [phone] 09/13/2019, 9:04 AM

## 2019-09-13 NOTE — Progress Notes (Signed)
85 year old male with prior h/o MV repair, endocarditis, cryptogenic stroke, paroxysmal atrial fib , s/p loop recorder, hyperlipidemia, parkinson's disease presents after a fall found to have left intertrochanteric fracture.. orthopedics consulted and plan for oR in am. Meanwhile cardiology consulted for pre op clearance and anti coagulation.   Hosie Poisson, MD [phone]

## 2019-09-14 ENCOUNTER — Inpatient Hospital Stay (HOSPITAL_COMMUNITY): Payer: Medicare Other

## 2019-09-14 ENCOUNTER — Encounter (HOSPITAL_COMMUNITY): Admission: RE | Payer: Self-pay | Source: Home / Self Care

## 2019-09-14 ENCOUNTER — Inpatient Hospital Stay (HOSPITAL_COMMUNITY): Payer: Medicare Other | Admitting: Anesthesiology

## 2019-09-14 ENCOUNTER — Ambulatory Visit (HOSPITAL_COMMUNITY): Admission: RE | Admit: 2019-09-14 | Payer: Medicare Other | Source: Home / Self Care | Admitting: Internal Medicine

## 2019-09-14 ENCOUNTER — Encounter (HOSPITAL_COMMUNITY): Payer: Self-pay | Admitting: Anesthesiology

## 2019-09-14 ENCOUNTER — Encounter (HOSPITAL_COMMUNITY)
Admission: EM | Disposition: A | Discharge status: Skilled Nursing Facility | Payer: Self-pay | Source: Home / Self Care | Attending: Internal Medicine

## 2019-09-14 DIAGNOSIS — S72142D Displaced intertrochanteric fracture of left femur, subsequent encounter for closed fracture with routine healing: Secondary | ICD-10-CM | POA: Diagnosis not present

## 2019-09-14 DIAGNOSIS — I480 Paroxysmal atrial fibrillation: Secondary | ICD-10-CM | POA: Diagnosis not present

## 2019-09-14 DIAGNOSIS — Z01810 Encounter for preprocedural cardiovascular examination: Secondary | ICD-10-CM | POA: Diagnosis not present

## 2019-09-14 DIAGNOSIS — E785 Hyperlipidemia, unspecified: Secondary | ICD-10-CM | POA: Diagnosis not present

## 2019-09-14 DIAGNOSIS — I429 Cardiomyopathy, unspecified: Secondary | ICD-10-CM | POA: Diagnosis not present

## 2019-09-14 HISTORY — PX: INTRAMEDULLARY (IM) NAIL INTERTROCHANTERIC: SHX5875

## 2019-09-14 LAB — CBC
HCT: 48.4 % (ref 39.0–52.0)
Hemoglobin: 15.4 g/dL (ref 13.0–17.0)
MCH: 31.5 pg (ref 26.0–34.0)
MCHC: 31.8 g/dL (ref 30.0–36.0)
MCV: 99 fL (ref 80.0–100.0)
Platelets: 108 10*3/uL — ABNORMAL LOW (ref 150–400)
RBC: 4.89 MIL/uL (ref 4.22–5.81)
RDW: 16.6 % — ABNORMAL HIGH (ref 11.5–15.5)
WBC: 7.4 10*3/uL (ref 4.0–10.5)
nRBC: 0 % (ref 0.0–0.2)

## 2019-09-14 LAB — BASIC METABOLIC PANEL
Anion gap: 10 (ref 5–15)
BUN: 23 mg/dL (ref 8–23)
CO2: 26 mmol/L (ref 22–32)
Calcium: 8.2 mg/dL — ABNORMAL LOW (ref 8.9–10.3)
Chloride: 106 mmol/L (ref 98–111)
Creatinine, Ser: 1.04 mg/dL (ref 0.61–1.24)
GFR calc Af Amer: >60 mL/min (ref >60)
GFR calc non Af Amer: >60 mL/min (ref >60)
Glucose, Bld: 123 mg/dL — ABNORMAL HIGH (ref 70–99)
Potassium: 3.5 mmol/L (ref 3.5–5.1)
Sodium: 142 mmol/L (ref 135–145)

## 2019-09-14 LAB — MRSA PCR SCREENING: MRSA by PCR: NEGATIVE

## 2019-09-14 LAB — PROTIME-INR
INR: 1.5 — ABNORMAL HIGH (ref 0.8–1.2)
Prothrombin Time: 17.6 seconds — ABNORMAL HIGH (ref 11.4–15.2)

## 2019-09-14 LAB — VITAMIN D 25 HYDROXY (VIT D DEFICIENCY, FRACTURES): Vit D, 25-Hydroxy: 45 ng/mL (ref 30–100)

## 2019-09-14 SURGERY — FIXATION, FRACTURE, INTERTROCHANTERIC, WITH INTRAMEDULLARY ROD
Anesthesia: General | Laterality: Left

## 2019-09-14 SURGERY — ECHOCARDIOGRAM, TRANSESOPHAGEAL
Anesthesia: Moderate Sedation

## 2019-09-14 MED ORDER — 0.9 % SODIUM CHLORIDE (POUR BTL) OPTIME
TOPICAL | Status: DC | PRN
  Administered 2019-09-14: 1000 mL

## 2019-09-14 MED ORDER — ROCURONIUM BROMIDE 10 MG/ML (PF) SYRINGE
PREFILLED_SYRINGE | INTRAVENOUS | Status: DC | PRN
  Administered 2019-09-14: 60 mg via INTRAVENOUS

## 2019-09-14 MED ORDER — VANCOMYCIN HCL 1000 MG IV SOLR
INTRAVENOUS | Status: AC
  Filled 2019-09-14: qty 1000

## 2019-09-14 MED ORDER — LIDOCAINE 2% (20 MG/ML) 5 ML SYRINGE
INTRAMUSCULAR | Status: DC | PRN
  Administered 2019-09-14: 60 mg via INTRAVENOUS

## 2019-09-14 MED ORDER — FENTANYL CITRATE (PF) 100 MCG/2ML IJ SOLN
INTRAMUSCULAR | Status: DC | PRN
  Administered 2019-09-14: 100 ug via INTRAVENOUS

## 2019-09-14 MED ORDER — ENSURE PRE-SURGERY PO LIQD
296.0000 mL | Freq: Once | ORAL | Status: AC
  Administered 2019-09-14: 296 mL via ORAL
  Filled 2019-09-14: qty 296

## 2019-09-14 MED ORDER — CARBIDOPA-LEVODOPA ER 50-200 MG PO TBCR
1.0000 | EXTENDED_RELEASE_TABLET | Freq: Every day | ORAL | Status: DC
  Administered 2019-09-14 – 2019-09-21 (×8): 1 via ORAL
  Filled 2019-09-14 (×9): qty 1

## 2019-09-14 MED ORDER — CEFAZOLIN SODIUM-DEXTROSE 2-4 GM/100ML-% IV SOLN
2.0000 g | Freq: Three times a day (TID) | INTRAVENOUS | Status: AC
  Administered 2019-09-14 – 2019-09-15 (×3): 2 g via INTRAVENOUS
  Filled 2019-09-14 (×3): qty 100

## 2019-09-14 MED ORDER — VANCOMYCIN HCL 1000 MG IV SOLR
INTRAVENOUS | Status: DC | PRN
  Administered 2019-09-14: 500 mg

## 2019-09-14 MED ORDER — PHENYLEPHRINE 40 MCG/ML (10ML) SYRINGE FOR IV PUSH (FOR BLOOD PRESSURE SUPPORT)
PREFILLED_SYRINGE | INTRAVENOUS | Status: DC | PRN
  Administered 2019-09-14: 120 ug via INTRAVENOUS
  Administered 2019-09-14: 80 ug via INTRAVENOUS
  Administered 2019-09-14 (×2): 120 ug via INTRAVENOUS

## 2019-09-14 MED ORDER — POVIDONE-IODINE 10 % EX SWAB: 2.0000 "application " | Freq: Once | CUTANEOUS | Status: DC

## 2019-09-14 MED ORDER — PROPOFOL 10 MG/ML IV BOLUS
INTRAVENOUS | Status: AC
  Filled 2019-09-14: qty 20

## 2019-09-14 MED ORDER — ENOXAPARIN SODIUM 40 MG/0.4ML ~~LOC~~ SOLN: 40.0000 mg | SUBCUTANEOUS | Status: DC

## 2019-09-14 MED ORDER — SUGAMMADEX SODIUM 200 MG/2ML IV SOLN
INTRAVENOUS | Status: DC | PRN
  Administered 2019-09-14: 200 mg via INTRAVENOUS

## 2019-09-14 MED ORDER — CARBIDOPA-LEVODOPA ER 50-200 MG PO TBCR
2.0000 | EXTENDED_RELEASE_TABLET | Freq: Every day | ORAL | Status: DC
  Administered 2019-09-14 – 2019-09-21 (×8): 2 via ORAL
  Filled 2019-09-14 (×9): qty 2

## 2019-09-14 MED ORDER — CARBIDOPA-LEVODOPA ER 50-200 MG PO TBCR
3.0000 | EXTENDED_RELEASE_TABLET | ORAL | Status: DC
  Administered 2019-09-15 – 2019-09-22 (×16): 3 via ORAL
  Filled 2019-09-14 (×17): qty 3

## 2019-09-14 MED ORDER — HYDROMORPHONE HCL 1 MG/ML IJ SOLN: 0.2500 mg | INTRAMUSCULAR | Status: DC | PRN

## 2019-09-14 MED ORDER — LACTATED RINGERS IV SOLN
INTRAVENOUS | Status: DC
  Administered 2019-09-14: 10:00:00 via INTRAVENOUS

## 2019-09-14 MED ORDER — SODIUM CHLORIDE 0.9 % IV SOLN
INTRAVENOUS | Status: DC | PRN
  Administered 2019-09-14: 50 ug/min via INTRAVENOUS

## 2019-09-14 MED ORDER — ONDANSETRON HCL 4 MG/2ML IJ SOLN
INTRAMUSCULAR | Status: DC | PRN
  Administered 2019-09-14: 4 mg via INTRAVENOUS

## 2019-09-14 MED ORDER — VANCOMYCIN HCL 500 MG IV SOLR
INTRAVENOUS | Status: AC
  Filled 2019-09-14: qty 500

## 2019-09-14 MED ORDER — CEFAZOLIN SODIUM-DEXTROSE 2-4 GM/100ML-% IV SOLN
2.0000 g | INTRAVENOUS | Status: AC
  Administered 2019-09-14: 2 g via INTRAVENOUS
  Filled 2019-09-14: qty 100

## 2019-09-14 MED ORDER — METHOCARBAMOL 500 MG PO TABS
500.0000 mg | ORAL_TABLET | Freq: Four times a day (QID) | ORAL | Status: DC | PRN
  Administered 2019-09-16: 500 mg via ORAL
  Filled 2019-09-14: qty 1

## 2019-09-14 MED ORDER — PROPOFOL 10 MG/ML IV BOLUS
INTRAVENOUS | Status: DC | PRN
  Administered 2019-09-14: 110 mg via INTRAVENOUS

## 2019-09-14 MED ORDER — FENTANYL CITRATE (PF) 250 MCG/5ML IJ SOLN
INTRAMUSCULAR | Status: AC
  Filled 2019-09-14: qty 5

## 2019-09-14 MED ORDER — ACETAMINOPHEN 325 MG PO TABS
650.0000 mg | ORAL_TABLET | Freq: Four times a day (QID) | ORAL | Status: DC
  Administered 2019-09-14 – 2019-09-22 (×28): 650 mg via ORAL
  Filled 2019-09-14 (×28): qty 2

## 2019-09-14 MED ORDER — MORPHINE SULFATE (PF) 2 MG/ML IV SOLN: 0.5000 mg | INTRAVENOUS | Status: DC | PRN

## 2019-09-14 MED ORDER — ONDANSETRON HCL 4 MG/2ML IJ SOLN: 4.0000 mg | Freq: Once | INTRAMUSCULAR | Status: DC | PRN

## 2019-09-14 MED ORDER — CHLORHEXIDINE GLUCONATE 4 % EX LIQD
60.0000 mL | Freq: Once | CUTANEOUS | Status: AC
  Administered 2019-09-14: 4 via TOPICAL
  Filled 2019-09-14: qty 60

## 2019-09-14 MED ORDER — HYDROCODONE-ACETAMINOPHEN 5-325 MG PO TABS
1.0000 | ORAL_TABLET | Freq: Four times a day (QID) | ORAL | Status: DC | PRN
  Administered 2019-09-16: 1 via ORAL
  Filled 2019-09-14: qty 1

## 2019-09-14 SURGICAL SUPPLY — 54 items
ADH SKN CLS APL DERMABOND .7 (GAUZE/BANDAGES/DRESSINGS) ×1
ADH SKN CLS LQ APL DERMABOND (GAUZE/BANDAGES/DRESSINGS) ×1
APL PRP STRL LF DISP 70% ISPRP (MISCELLANEOUS) ×1
BIT DRILL LONG 4.0 (BIT) IMPLANT
BRUSH SCRUB EZ PLAIN DRY (MISCELLANEOUS) ×6 IMPLANT
CHLORAPREP W/TINT 26 (MISCELLANEOUS) ×3 IMPLANT
COVER PERINEAL POST (MISCELLANEOUS) ×3 IMPLANT
COVER SURGICAL LIGHT HANDLE (MISCELLANEOUS) ×3 IMPLANT
COVER WAND RF STERILE (DRAPES) IMPLANT
DERMABOND ADHESIVE PROPEN (GAUZE/BANDAGES/DRESSINGS) ×2
DERMABOND ADVANCED (GAUZE/BANDAGES/DRESSINGS) ×2
DERMABOND ADVANCED .7 DNX12 (GAUZE/BANDAGES/DRESSINGS) ×1 IMPLANT
DERMABOND ADVANCED .7 DNX6 (GAUZE/BANDAGES/DRESSINGS) IMPLANT
DRAPE C-ARM 35X43 STRL (DRAPES) ×3 IMPLANT
DRAPE IMP U-DRAPE 54X76 (DRAPES) ×6 IMPLANT
DRAPE INCISE IOBAN 66X45 STRL (DRAPES) ×3 IMPLANT
DRAPE STERI IOBAN 125X83 (DRAPES) ×3 IMPLANT
DRAPE SURG 17X23 STRL (DRAPES) ×6 IMPLANT
DRAPE U-SHAPE 47X51 STRL (DRAPES) ×3 IMPLANT
DRILL BIT LONG 4.0 (BIT) ×3
DRSG MEPILEX BORDER 4X4 (GAUZE/BANDAGES/DRESSINGS) ×3 IMPLANT
DRSG MEPILEX BORDER 4X8 (GAUZE/BANDAGES/DRESSINGS) ×3 IMPLANT
ELECT REM PT RETURN 9FT ADLT (ELECTROSURGICAL) ×3
ELECTRODE REM PT RTRN 9FT ADLT (ELECTROSURGICAL) ×1 IMPLANT
GLOVE BIO SURGEON STRL SZ 6.5 (GLOVE) ×8 IMPLANT
GLOVE BIO SURGEON STRL SZ7.5 (GLOVE) ×12 IMPLANT
GLOVE BIO SURGEONS STRL SZ 6.5 (GLOVE) ×5
GLOVE BIOGEL PI IND STRL 6.5 (GLOVE) ×1 IMPLANT
GLOVE BIOGEL PI IND STRL 7.0 (GLOVE) IMPLANT
GLOVE BIOGEL PI IND STRL 7.5 (GLOVE) ×1 IMPLANT
GLOVE BIOGEL PI INDICATOR 6.5 (GLOVE) ×2
GLOVE BIOGEL PI INDICATOR 7.0 (GLOVE) ×4
GLOVE BIOGEL PI INDICATOR 7.5 (GLOVE) ×2
GOWN STRL REUS W/ TWL LRG LVL3 (GOWN DISPOSABLE) ×1 IMPLANT
GOWN STRL REUS W/TWL LRG LVL3 (GOWN DISPOSABLE) ×3
GUIDE PIN 3.2X343 (PIN) ×2
GUIDE PIN 3.2X343MM (PIN) ×6
KIT BASIN OR (CUSTOM PROCEDURE TRAY) ×3 IMPLANT
KIT TURNOVER KIT B (KITS) ×3 IMPLANT
MANIFOLD NEPTUNE II (INSTRUMENTS) ×3 IMPLANT
NAIL INTERTAN 10X18 130D 10S (Nail) ×2 IMPLANT
NS IRRIG 1000ML POUR BTL (IV SOLUTION) ×3 IMPLANT
PACK GENERAL/GYN (CUSTOM PROCEDURE TRAY) ×3 IMPLANT
PAD ARMBOARD 7.5X6 YLW CONV (MISCELLANEOUS) ×6 IMPLANT
PIN GUIDE 3.2X343MM (PIN) IMPLANT
SCREW LAG COMPR KIT 100/95 (Screw) ×2 IMPLANT
SCREW TRIGEN LOW PROF 5.0X37.5 (Screw) ×2 IMPLANT
SUT MNCRL AB 3-0 PS2 18 (SUTURE) ×3 IMPLANT
SUT VIC AB 0 CT1 27 (SUTURE)
SUT VIC AB 0 CT1 27XBRD ANBCTR (SUTURE) IMPLANT
SUT VIC AB 2-0 CT1 27 (SUTURE) ×3
SUT VIC AB 2-0 CT1 TAPERPNT 27 (SUTURE) ×2 IMPLANT
TOWEL GREEN STERILE (TOWEL DISPOSABLE) ×6 IMPLANT
WATER STERILE IRR 1000ML POUR (IV SOLUTION) ×3 IMPLANT

## 2019-09-14 NOTE — Anesthesia Preprocedure Evaluation (Addendum)
Anesthesia Evaluation  Patient identified by MRN, date of birth, ID band Patient awake    Reviewed: Allergy & Precautions, NPO status , Patient's Chart, lab work & pertinent test results, reviewed documented beta blocker date and time   Airway Mallampati: II  TM Distance: >3 FB Neck ROM: Full    Dental no notable dental hx. (+) Teeth Intact   Pulmonary neg pulmonary ROS,    Pulmonary exam normal breath sounds clear to auscultation       Cardiovascular Normal cardiovascular exam+ dysrhythmias Atrial Fibrillation + Valvular Problems/Murmurs MR  Rhythm:Regular Rate:Normal  S/P MVR EKG 09/13/2019 NSR, Normal Echo 08/30/2019 1. Left ventricular ejection fraction, by visual estimation, is 45 to 50%. The left ventricle has mildly decreased function. Mildly increased left ventricular size. There is no left ventricular hypertrophy. Mild diffuse hypokinesis.  2. Left ventricular diastolic Doppler parameters are indeterminate pattern of LV diastolic filling.  3. Global right ventricle has moderately reduced systolic function.The right ventricular size is mildly enlarged. No increase in right ventricular wall thickness.  4. The patient is status post mitral valve repair. There appears to be severe mitral regurgitation, eccentric and posteriorly-directed. The mean gradient was 7 mmHg but MVA was normal by PHT, I think that the elevated mean gradient is due to high flow  from mitral regurgitation rather than significant mitral stenosis.  5. The aortic valve is tricuspid Aortic valve regurgitation is mild by color flow Doppler. Mild aortic valve sclerosis without stenosis.  6. There is mild dilatation of the aortic root measuring 39 mm.  7. The tricuspid valve is normal in structure. Tricuspid valve regurgitation moderate.  8. Left atrial size was severely dilated.  9. Right atrial size was severely dilated. 10. The IVC was not visualized. Peak RV-RA  gradient 28 mmHg.    Neuro/Psych Anxiety Parkinson's disease CVA    GI/Hepatic negative GI ROS, Neg liver ROS,   Endo/Other  Hyperlipidemia  Renal/GU negative Renal ROS  negative genitourinary   Musculoskeletal   Abdominal   Peds  Hematology Thrombocytopenia Warfarin therapy - last dose 10/21  PT-17.6  INR 1.5   Anesthesia Other Findings   Reproductive/Obstetrics                             Anesthesia Physical Anesthesia Plan  ASA: III  Anesthesia Plan: General   Post-op Pain Management:    Induction: Intravenous  PONV Risk Score and Plan: 3 and Ondansetron and Treatment may vary due to age or medical condition  Airway Management Planned: Oral ETT  Additional Equipment:   Intra-op Plan:   Post-operative Plan:   Informed Consent: I have reviewed the patients History and Physical, chart, labs and discussed the procedure including the risks, benefits and alternatives for the proposed anesthesia with the patient or authorized representative who has indicated his/her understanding and acceptance.     Dental advisory given  Plan Discussed with: CRNA and Surgeon  Anesthesia Plan Comments:         Anesthesia Quick Evaluation

## 2019-09-14 NOTE — Progress Notes (Addendum)
Progress Note  Patient Name: [name] [name] Date of Encounter: 09/14/2019  Primary Cardiologist: Pixie Casino, MD   Subjective   Pt sleeping   Breathing comfortably    Inpatient Medications    Scheduled Meds: . acetaminophen  650 mg Oral Q6H  . amantadine  100 mg Oral BID  . atorvastatin  20 mg Oral QPM  . carbidopa-levodopa  3 tablet Oral 2 times per day   And  . carbidopa-levodopa  2 tablet Oral q1800   And  . carbidopa-levodopa  1 tablet Oral QHS  . Carbidopa-Levodopa ER  1-3 capsule Oral See admin instructions  . donepezil  10 mg Oral QHS  . escitalopram  10 mg Oral Daily  . metoprolol succinate  12.5 mg Oral Daily  . rotigotine  1 patch Transdermal Daily  . selegiline  5 mg Oral QAC breakfast   And  . selegiline  2.5 mg Oral QAC supper   Continuous Infusions: .  ceFAZolin (ANCEF) IV    . lactated ringers Stopped (09/14/19 1300)   PRN Meds: bisacodyl, HYDROcodone-acetaminophen, methocarbamol, morphine injection   Vital Signs    Vitals:   09/14/19 1315 09/14/19 1330 09/14/19 1345 09/14/19 1413  BP: 107/90 (!) 110/91 105/86 91/80  Pulse: (!) 110 (!) 105 (!) 101 89  Resp: 16 19 18 18   Temp:   (!) 97 F (36.1 C) (!) 97.5 F (36.4 C)  TempSrc:    Oral  SpO2: 97% 94% 96% 95%  Weight:      Height:        Intake/Output Summary (Last 24 hours) at 09/14/2019 1556 Last data filed at 09/14/2019 1352 Gross per 24 hour  Intake 954.81 ml  Output 700 ml  Net 254.81 ml   Last 3 Weights 09/14/2019 09/05/2019 08/24/2019  Weight (lbs) 169 lb 12.8 oz 169 lb 12.8 oz 177 lb 9.6 oz  Weight (kg) 77.021 kg 77.021 kg 80.559 kg      Telemetry    afib 90s  - Personally Reviewed  ECG      Physical Exam   GEN: No acute distress.  Sleeping  Neck: No JVD Cardiac: Irreg irreg , no murmurs, rubs, or gallops.  Respiratory: Clear to auscultation bilaterally. anteriorly GI: Soft, nontender, non-distended  MS: No edema; No deformity.   Labs    High Sensitivity  Troponin:  No results for input(s): TROPONINIHS in the last 720 hours.    Chemistry Recent Labs  Lab 09/10/19 1042 09/12/19 1925 09/14/19 1432  NA 143 139 142  K 4.7 4.0 3.5  CL 102 103 106  CO2 27 27 26   GLUCOSE 104* 139* 123*  BUN 26 26* 23  CREATININE 1.08 1.20 1.04  CALCIUM 9.0 9.0 8.2*  GFRNONAA 62 55* >60  GFRAA 72 >60 >60  ANIONGAP  --  9 10     Hematology Recent Labs  Lab 09/10/19 1042 09/12/19 1925 09/14/19 1432  WBC 4.8 7.8 7.4  RBC 4.74 4.63 4.89  HGB 15.0 14.8 15.4  HCT 44.9 45.6 48.4  MCV 95 98.5 99.0  MCH 31.6 32.0 31.5  MCHC 33.4 32.5 31.8  RDW 13.9 16.5* 16.6*  PLT 120* 108* PENDING    BNPNo results for input(s): BNP, PROBNP in the last 168 hours.   DDimer No results for input(s): DDIMER in the last 168 hours.   Radiology    Ct Head Wo Contrast  Result Date: 09/12/2019 CLINICAL DATA:  Fall with head pain EXAM: CT HEAD WITHOUT CONTRAST TECHNIQUE: Contiguous  axial images were obtained from the base of the skull through the vertex without intravenous contrast. COMPARISON:  None. FINDINGS: Brain: No evidence of acute infarction, hemorrhage, hydrocephalus, extra-axial collection or mass lesion/mass effect. There is mild cerebral volume loss with associated ex vacuo dilatation. Periventricular and subcortical white matter hypoattenuation likely represents chronic small vessel ischemic disease. Vascular: There are vascular calcifications in the carotid siphons. Skull: Normal. Negative for fracture or focal lesion. Sinuses/Orbits: There is frontal and ethmoid sinus disease. Other: None. IMPRESSION: No acute intracranial process. Electronically Signed   By: Romona Curlsyler  Litton M.D.   On: 09/12/2019 21:16   Dg Shoulder Left  Result Date: 09/12/2019 CLINICAL DATA:  Fall EXAM: LEFT SHOULDER - 2+ VIEW COMPARISON:  None. FINDINGS: No fracture or dislocation. High-riding humeral head. AC joint degenerative change IMPRESSION: 1. No acute osseous abnormality 2.  High-riding humeral head, consistent with rotator cuff disease Electronically Signed   By: Jasmine PangKim  Fujinaga M.D.   On: 09/12/2019 20:15   Dg C-arm 1-60 Min  Result Date: 09/14/2019 CLINICAL DATA:  Left femoral nail placement. EXAM: OPERATIVE left HIP (WITH PELVIS IF PERFORMED) 5 VIEWS TECHNIQUE: Fluoroscopic spot image(s) were submitted for interpretation post-operatively. FLUOROSCOPY TIME:  2 minutes 25 seconds. COMPARISON:  September 12, 2019. FINDINGS: Five intraoperative fluoroscopic images of the left hip demonstrate the patient be status post surgical internal fixation of intertrochanteric fracture of the proximal left femur. IMPRESSION: Fluoroscopic guidance provided during surgical internal fixation of intertrochanteric fracture of proximal left femur. Electronically Signed   By: Lupita RaiderJames  Green Jr M.D.   On: 09/14/2019 12:48   Dg Hip Operative Unilat W Or W/o Pelvis Left  Result Date: 09/14/2019 CLINICAL DATA:  Left femoral nail placement. EXAM: OPERATIVE left HIP (WITH PELVIS IF PERFORMED) 5 VIEWS TECHNIQUE: Fluoroscopic spot image(s) were submitted for interpretation post-operatively. FLUOROSCOPY TIME:  2 minutes 25 seconds. COMPARISON:  September 12, 2019. FINDINGS: Five intraoperative fluoroscopic images of the left hip demonstrate the patient be status post surgical internal fixation of intertrochanteric fracture of the proximal left femur. IMPRESSION: Fluoroscopic guidance provided during surgical internal fixation of intertrochanteric fracture of proximal left femur. Electronically Signed   By: Lupita RaiderJames  Green Jr M.D.   On: 09/14/2019 12:48   Dg Hip Unilat W Or W/o Pelvis 2-3 Views Left  Result Date: 09/14/2019 CLINICAL DATA:  Left femur ORIF. EXAM: DG HIP (WITH OR WITHOUT PELVIS) 2-3V LEFT COMPARISON:  Left hip x-rays from same day and September 12, 2019. FINDINGS: Interval left intertrochanteric femur fracture cephalomedullary nail fixation. Postsurgical changes and subcutaneous emphysema about the  left lateral hip. IMPRESSION: 1. Interval left intertrochanteric femur fracture ORIF without acute postoperative complication. Electronically Signed   By: Obie DredgeWilliam T Derry M.D.   On: 09/14/2019 13:50   Dg Hip Unilat With Pelvis 2-3 Views Left  Result Date: 09/12/2019 CLINICAL DATA:  Hip pain status post fall EXAM: DG HIP (WITH OR WITHOUT PELVIS) 2-3V LEFT COMPARISON:  None. FINDINGS: Pubic symphysis and rami are intact. Both femoral heads project in joint. Mild degenerative changes bilaterally. Acute displaced left trochanteric fracture. IMPRESSION: Acute displaced left trochanteric fracture Electronically Signed   By: Jasmine PangKim  Fujinaga M.D.   On: 09/12/2019 20:15    Cardiac Studies   Echo 08/2019 1. Left ventricular ejection fraction, by visual estimation, is 45 to 50%. The left ventricle has mildly decreased function. Mildly increased left ventricular size. There is no left ventricular hypertrophy. Mild diffuse hypokinesis. 2. Left ventricular diastolic Doppler parameters are indeterminate pattern of LV diastolic  filling. 3. Global right ventricle has moderately reduced systolic function.The right ventricular size is mildly enlarged. No increase in right ventricular wall thickness. 4. The patient is status post mitral valve repair. There appears to be severe mitral regurgitation, eccentric and posteriorly-directed. The mean gradient was 7 mmHg but MVA was normal by PHT, I think that the elevated mean gradient is due to high flow  from mitral regurgitation rather than significant mitral stenosis. 5. The aortic valve is tricuspid Aortic valve regurgitation is mild by color flow Doppler. Mild aortic valve sclerosis without stenosis. 6. There is mild dilatation of the aortic root measuring 39 mm. 7. The tricuspid valve is normal in structure. Tricuspid valve regurgitation moderate. 8. Left atrial size was severely dilated. 9. Right atrial size was severely dilated. 10. The IVC was not  visualized. Peak RV-RA gradient 28 mmHg.   Patient Profile   Helaman Mecca is a 85 y.o. male with a hx of endocarditis s/p mitral valve repair in 2008, cryptogenic stroke with subsequent discover of paroxysmal Afib by implantable loop recorder, hyperlipidemia, and Parkinson's disease who is being seen today for the evaluation of pre-operative evaluation for left intertrochanteric fx repair at the request of Dr. Blake Divine.  Assessment & Plan    Pt underwent surgery on hip today  1  Paroxysmal atrial fibrillation  Pt is back in atrial fibrllation  Rates not bad   Would start heparin if ok from surgical standpoint given hx of CVA in pasat    Will confirm with ortho  Note their plans to start coumadin tomorrow   Would , with hx CVA, start heparin as well in early am until INR therapeutic   Discussed with Dr Jena Gauss (ortho)   OK with this plan as long as not marked drop in Hgb in am labs    2  Mitral valve dz with MR   Plan for eval at some pt with TEE after recovers  Pt set for next week but probably postpont    3  LV dysfunction   Mld on echo    VOlume appear ok  Follow Ins/Outs closely  For questions or updates, please contact [facility] Please consult [email or web address] for contact info under        Signed, Dietrich Pates, MD  09/14/2019, 3:56 PM

## 2019-09-14 NOTE — Anesthesia Procedure Notes (Signed)
Procedure Name: Intubation Date/Time: 09/14/2019 11:18 AM Performed by: Candis Shine, CRNA Pre-anesthesia Checklist: Patient identified, Emergency Drugs available, Suction available and Patient being monitored Patient Re-evaluated:Patient Re-evaluated prior to induction Oxygen Delivery Method: Circle System Utilized Preoxygenation: Pre-oxygenation with 100% oxygen Induction Type: IV induction Ventilation: Mask ventilation without difficulty Laryngoscope Size: Mac and 4 Grade View: Grade I Tube type: Oral Tube size: 7.5 mm Number of attempts: 1 Airway Equipment and Method: Stylet Placement Confirmation: ETT inserted through vocal cords under direct vision,  positive ETCO2 and breath sounds checked- equal and bilateral Secured at: 22 cm Tube secured with: Tape Dental Injury: Teeth and Oropharynx as per pre-operative assessment

## 2019-09-14 NOTE — Addendum Note (Signed)
Addendum  created 09/14/19 1359 by Marsa Aris, CRNA   Intraprocedure Meds edited

## 2019-09-14 NOTE — Progress Notes (Signed)
Ortho Trauma Progress Note  INR has come down to 1.5. Plan to proceed with surgery. No questions from patient.  Shona Needles, MD [facility] [phone] (phone) [phone] (office) [email or web address]

## 2019-09-14 NOTE — Op Note (Signed)
Orthopaedic Surgery Operative Note (CSN: [id number] ) Date of Surgery: 09/14/2019  Admit Date: 09/12/2019   Diagnoses: Pre-Op Diagnoses: Left intertrochanteric femur fracture  Post-Op Diagnosis: Same  Procedures: CPT 27245-Cephalomedullary nailing of left intertrochanteric femur fracture  Surgeons : Primary: Shona Needles, MD  Assistant: Patrecia Pace, PA-C  Location: OR 6   Anesthesia:General  Antibiotics: Ancef 2g preop   Tourniquet time: None  Estimated Blood EPPI:951 mL  Complications:None   Specimens:None   Implants: Implant Name Type Inv. Item Serial No. Manufacturer Lot No. LRB No. Used Action  NAIL INTERTAN 10X18 130D 10S - [id number] Nail NAIL INTERTAN 10X18 130D 10S  [employer] [id number] Left 1 Implanted  SCREW LAG TRIGEN INTERLOCK - [id number] Screw SCREW LAG TRIGEN INTERLOCK  [employer] [id number] Left 1 Implanted  SCREW TRIGEN LOW PROF 5.0X37.5 - [id number] Screw SCREW TRIGEN LOW PROF 5.0X37.5  [employer] [id number] Left 1 Implanted     Indications for Surgery: 85 year old male who has a history of valve replacement on Coumadin with concerns as well had a ground-level fall and sustained a left intertrochanteric femur fracture.  Presented with an INR of over 3.  It increased the day after arrival.  He was given IV vitamin K to reverse his INR.  It eventually came down to 1.5.  I felt that this was safe to proceed with surgical fixation.  I recommended proceeding with cephalomedullary nailing of his left intertrochanteric femur fracture.  I discussed risks and benefits with the patient's wife and the patient himself.  Risks included but not limited to death bleeding, infection, malunion, nonunion, hardware failure, hardware irritation, nerve and blood vessel injury, DVT, periprosthetic fracture, even the possibility of anesthetic complications.  Patient agreed to proceed with surgery and consent was  obtained.  Operative Findings: Cephalomedullary nailing of left intertrochanteric femur fracture using [employer] InterTAN 10 x 180 mm nail with a 100 mm combo lag screw.  Procedure: The patient was identified in the preoperative holding area. Consent was confirmed with the patient and their family and all questions were answered. The operative extremity was marked after confirmation with the patient and they were then brought back to the operating room by our anesthesia colleagues. The patient was placed under general anesthesia and then carefully transferred over to a Hana table. The feet were secured into a traction boot and well padded. A post was placed in the groin and traction was pulled on the operative leg. The contralateral leg was positioned out of the way of fluoroscopy and secure . Fluoroscopic images were obtained and traction and manipulation was performed to reduce the fracture. Once adequate reduction was performed then the operative extremity was prepped and draped in sterile fashion. Preincision timeout was performed to verify the patient, the procedure and the extremity. Preoperative antibiotics were dosed.  A small incision was made along the lateral femur I split the IT band in line with this and placed a Cobb along the inferior head neck segment.  I proceeded to perform a reduction maneuver by pushing the head neck segment posteriorly and reducing the back to an anatomic position.  I then marked out the appropriate starting point proximal to the greater trochanter.  I made an incision and split the gluteus in line with the incision.  I placed a threaded guidepin at the tip of the greater trochanter and entered the metaphysis and directed this down into the femoral canal.  A entry reamer was used to  enter the canal.  I used a 10 mm x 180 mm short nail and passed this.  I set my version made a small incision laterally to place the guide for the lag screw.  A threaded guidepin was  position on AP and lateral fluoroscopic imaging to get an appropriate tip apex distance.  I measured and chose 100 mm combo lag screw.  The Further compression screw was then drilled and an antirotational bar was placed.  I then drilled for the lag screw proximally and I placed 100 mm lag screw.  Unfortunately even with the antirotation bar in place there was some rotation of the inferior head neck segment.  And there was a slight malreduction which I felt that was acceptable.  The compression screw was then placed and compression was obtained across the fracture.  The setscrew was then tightened and locked in place.  Using the jig a distal interlocking screw was placed.  The jig was removed from the nail.  Final fluoroscopic images were obtained and the incisions were copiously irrigated. The skin was closed with 2-0 vicryl, 3-0 monocryl and sealed with dermabond. The incisions were dressing with Mepilex dressings. The patient was carefully transferred to the regular floor bed and was taken to PACU in stable condition.  Post Op Plan/Instructions: Patient will be weightbearing as tolerated to the left lower extremity.  He will receive postoperative Ancef.  He may be restarted on his Coumadin starting postoperative day 1 if his hemoglobin is stable.  We will mobilize him with physical and Occupational Therapy.  I was present and performed the entire surgery.  Ulyses Southward, PA-C did assist me throughout the case. An assistant was necessary given the difficulty in approach, maintenance of reduction and ability to instrument the fracture.   Truitt Merle, MD [facility]

## 2019-09-14 NOTE — Plan of Care (Signed)

## 2019-09-14 NOTE — Progress Notes (Signed)
PROGRESS NOTE    [name] [name]  LOV:[id number] DOB: 04/30/1932 DOA: 09/12/2019 PCP: Rodrigo Ran, MD    Brief Narrative:   85 year old male with prior h/o parkinson's disease, MVR , h/o endocarditis, MV angioplasty in 2008. PAF on coumadin, TIA, hyperlipidemia, presents after a fall and was found to have acute displaced left intertrochanteric fracture.  Orthopedics was consulted and he is scheduled for surgical fixation on 10/23. Meanwhile in view of his multiple cardiac issues, cardiology consulted for pre op clearance.    Assessment & Plan:   Active Problems:   Femur fracture, left (HCC)   Acute left femur fracture: Scheduled for surgical fixation today.  pain control and PT eval in am.     Parkinson's disease:  Resume home carbidopa and levodopa, amantadine and Selegiline.   Dementia:  Continue with aricept.    Hyperlipidemia:  Continue with lipitor.    H/o MVR and paroxysmal atrial fibrillation:  Rate controlled with metoprolol and on coumadin for anti coagulation, which has been held for the procedure.  Start  IV heparin and coumadin post op if okay with orthopedics.    Chronic systolic heart failure with severe MR:  Echocardiogram earlier this month shows LVEF of 45% to 50% with diffuse hypokinesis.  On low dose lasix which has been held.  TEE scheduled this admission.   Chronic mild thrombocytopenia.  Recheck counts today.    DVT prophylaxis: Heparin Code Status: Full code Family Communication: None at bedside Disposition Plan: Pending further evaluation by orthopedics and PT evaluation.  Consultants:   Cardiology  Orthopedics  Procedures: OR on 09/14/2019 Antimicrobials: 1 dose of Ancef perioperatively  Subjective: No complaints.   Objective: Vitals:   09/13/19 2139 09/14/19 0358 09/14/19 0755 09/14/19 1010  BP: 117/88 (!) 117/97 108/78   Pulse: 79 (!) 108 81   Resp: 18 16 18    Temp: 98.3 F (36.8 C) 98 F (36.7 C) (!) 97.3 F (36.3 C)    TempSrc: Oral Oral Oral   SpO2: 93% 99% 97%   Weight:    77 kg  Height:    5\' 8"  (1.727 m)    Intake/Output Summary (Last 24 hours) at 09/14/2019 1116 Last data filed at 09/14/2019 0811 Gross per 24 hour  Intake 104.81 ml  Output 400 ml  Net -295.19 ml   Filed Weights   09/14/19 1010  Weight: 77 kg    Examination:  General exam: Appears calm and comfortable  Respiratory system: Clear to auscultation. Respiratory effort normal. Cardiovascular system: S1 & S2 heard, RRR. No JVD, Gastrointestinal system: Abdomen is nondistended, soft and nontender. . Normal bowel sounds heard. Central nervous system: Alert and oriented. No focal neurological deficits. Extremities: painful ROM of the left lower extremity. Tender ness at the left hip.  Skin: No rashes, lesions or ulcers Psychiatry: Mood & affect appropriate.     Data Reviewed: I have personally reviewed following labs and imaging studies  CBC: Recent Labs  Lab 09/10/19 1042 09/12/19 1925  WBC 4.8 7.8  HGB 15.0 14.8  HCT 44.9 45.6  MCV 95 98.5  PLT 120* 108*   Basic Metabolic Panel: Recent Labs  Lab 09/10/19 1042 09/12/19 1925  NA 143 139  K 4.7 4.0  CL 102 103  CO2 27 27  GLUCOSE 104* 139*  BUN 26 26*  CREATININE 1.08 1.20  CALCIUM 9.0 9.0   GFR: Estimated Creatinine Clearance: 43.5 mL/min (by C-G formula based on SCr of 1.2 mg/dL). Liver Function Tests: No results  for input(s): AST, ALT, ALKPHOS, BILITOT, PROT, ALBUMIN in the last 168 hours. No results for input(s): LIPASE, AMYLASE in the last 168 hours. No results for input(s): AMMONIA in the last 168 hours. Coagulation Profile: Recent Labs  Lab 09/10/19 0959 09/12/19 1925 09/13/19 0520 09/13/19 1954 09/14/19 0245  INR 3.2* 3.4* 3.8* 1.9* 1.5*   Cardiac Enzymes: No results for input(s): CKTOTAL, CKMB, CKMBINDEX, TROPONINI in the last 168 hours. BNP (last 3 results) No results for input(s): PROBNP in the last 8760 hours. HbA1C: No results  for input(s): HGBA1C in the last 72 hours. CBG: No results for input(s): GLUCAP in the last 168 hours. Lipid Profile: No results for input(s): CHOL, HDL, LDLCALC, TRIG, CHOLHDL, LDLDIRECT in the last 72 hours. Thyroid Function Tests: No results for input(s): TSH, T4TOTAL, FREET4, T3FREE, THYROIDAB in the last 72 hours. Anemia Panel: No results for input(s): VITAMINB12, FOLATE, FERRITIN, TIBC, IRON, RETICCTPCT in the last 72 hours. Sepsis Labs: No results for input(s): PROCALCITON, LATICACIDVEN in the last 168 hours.  Recent Results (from the past 240 hour(s))  SARS CORONAVIRUS 2 (TAT 6-24 HRS) Nasopharyngeal Nasopharyngeal Swab     Status: None   Collection Time: 09/12/19  8:44 PM   Specimen: Nasopharyngeal Swab  Result Value Ref Range Status   SARS Coronavirus 2 NEGATIVE NEGATIVE Final    Comment: (NOTE) SARS-CoV-2 target nucleic acids are NOT DETECTED. The SARS-CoV-2 RNA is generally detectable in upper and lower respiratory specimens during the acute phase of infection. Negative results do not preclude SARS-CoV-2 infection, do not rule out co-infections with other pathogens, and should not be used as the sole basis for treatment or other patient management decisions. Negative results must be combined with clinical observations, patient history, and epidemiological information. The expected result is Negative. Fact Sheet for Patients: HairSlick.nohttps://www.fda.gov/media/138098/download Fact Sheet for Healthcare Providers: quierodirigir.comhttps://www.fda.gov/media/138095/download This test is not yet approved or cleared by the [place] FDA and  has been authorized for detection and/or diagnosis of SARS-CoV-2 by FDA under an Emergency Use Authorization (EUA). This EUA will remain  in effect (meaning this test can be used) for the duration of the COVID-19 declaration under Section 56 4(b)(1) of the Act, 21 U.S.C. section 360bbb-3(b)(1), unless the authorization is terminated or revoked sooner.  Performed at [facility], [address] [place], [place] [address]   MRSA PCR Screening     Status: None   Collection Time: 09/14/19 12:55 AM   Specimen: Nasal Mucosa; Nasopharyngeal  Result Value Ref Range Status   MRSA by PCR NEGATIVE NEGATIVE Final    Comment:        The GeneXpert MRSA Assay (FDA approved for NASAL specimens only), is one component of a comprehensive MRSA colonization surveillance program. It is not intended to diagnose MRSA infection nor to guide or monitor treatment for MRSA infections. Performed at [facility], [address] [place], [place] [address]          Radiology Studies: Ct Head Wo Contrast  Result Date: 09/12/2019 CLINICAL DATA:  Fall with head pain EXAM: CT HEAD WITHOUT CONTRAST TECHNIQUE: Contiguous axial images were obtained from the base of the skull through the vertex without intravenous contrast. COMPARISON:  None. FINDINGS: Brain: No evidence of acute infarction, hemorrhage, hydrocephalus, extra-axial collection or mass lesion/mass effect. There is mild cerebral volume loss with associated ex vacuo dilatation. Periventricular and subcortical white matter hypoattenuation likely represents chronic small vessel ischemic disease. Vascular: There are vascular calcifications in the carotid siphons.  Skull: Normal. Negative for fracture or focal lesion. Sinuses/Orbits: There is frontal and ethmoid sinus disease. Other: None. IMPRESSION: No acute intracranial process. Electronically Signed   By: Zerita Boers M.D.   On: 09/12/2019 21:16   Dg Shoulder Left  Result Date: 09/12/2019 CLINICAL DATA:  Fall EXAM: LEFT SHOULDER - 2+ VIEW COMPARISON:  None. FINDINGS: No fracture or dislocation. High-riding humeral head. AC joint degenerative change IMPRESSION: 1. No acute osseous abnormality 2. High-riding humeral head, consistent with rotator cuff disease Electronically Signed   By: Donavan Foil M.D.   On: 09/12/2019 20:15   Dg Hip Unilat  With Pelvis 2-3 Views Left  Result Date: 09/12/2019 CLINICAL DATA:  Hip pain status post fall EXAM: DG HIP (WITH OR WITHOUT PELVIS) 2-3V LEFT COMPARISON:  None. FINDINGS: Pubic symphysis and rami are intact. Both femoral heads project in joint. Mild degenerative changes bilaterally. Acute displaced left trochanteric fracture. IMPRESSION: Acute displaced left trochanteric fracture Electronically Signed   By: Donavan Foil M.D.   On: 09/12/2019 20:15        Scheduled Meds: . [MAR Hold] amantadine  100 mg Oral BID  . [MAR Hold] atorvastatin  20 mg Oral QPM  . [MAR Hold] carbidopa-levodopa  3 tablet Oral 2 times per day   And  . [MAR Hold] carbidopa-levodopa  2 tablet Oral q1800   And  . [MAR Hold] carbidopa-levodopa  1 tablet Oral QHS  . [MAR Hold] Carbidopa-Levodopa ER  1-3 capsule Oral See admin instructions  . [MAR Hold] donepezil  10 mg Oral QHS  . [MAR Hold] escitalopram  10 mg Oral Daily  . [MAR Hold] furosemide  10 mg Oral Daily  . [MAR Hold] metoprolol succinate  12.5 mg Oral Daily  . povidone-iodine  2 application Topical Once  . [MAR Hold] rotigotine  1 patch Transdermal Daily  . [MAR Hold] selegiline  5 mg Oral QAC breakfast   And  . [MAR Hold] selegiline  2.5 mg Oral QAC supper   Continuous Infusions: . sodium chloride 20 mL/hr at 09/13/19 2144  .  ceFAZolin (ANCEF) IV    . lactated ringers 10 mL/hr at 09/14/19 1012     LOS: 1 day        Hosie Poisson, MD [facility] Pager [phone] If 7PM-7AM, please contact night-coverage [email or web address] Password [email or web address] 09/14/2019, 11:16 AM

## 2019-09-14 NOTE — Progress Notes (Signed)
Pt NPO since midnight,finished his pre-nsure drink by 0530 this am. HIBICLENS used to washed him up this morning, MRSA by PCR done and negative, consent form filled out, need wife to give verbal consent since pt is alert and oriented x 3 and forget things sometimes, will update incoming nurse and continue to monitor.

## 2019-09-14 NOTE — Transfer of Care (Signed)
Immediate Anesthesia Transfer of Care Note  Patient: [name] [name]  Procedure(s) Performed: INTRAMEDULLARY (IM) NAIL INTERTROCHANTRIC (Left )  Patient Location: PACU  Anesthesia Type:General  Level of Consciousness: awake, alert  and patient cooperative  Airway & Oxygen Therapy: Patient Spontanous Breathing and Patient connected to nasal cannula oxygen  Post-op Assessment: Report given to RN and Post -op Vital signs reviewed and stable  Post vital signs: Reviewed and stable  Last Vitals:  Vitals Value Taken Time  BP 98/77 09/14/19 1300  Temp 36.1 C 09/14/19 1300  Pulse 110 09/14/19 1300  Resp 17 09/14/19 1304  SpO2 95 % 09/14/19 1300  Vitals shown include unvalidated device data.  Last Pain:  Vitals:   09/14/19 0755  TempSrc: Oral  PainSc:          Complications: No apparent anesthesia complications

## 2019-09-14 NOTE — Care Management (Signed)
CM consult for LTACH acknowledged. CM will continue to follow for DCP needs.  Midge Minium RN, BSN, NCM-BC, ACM-RN [phone]

## 2019-09-14 NOTE — Anesthesia Postprocedure Evaluation (Signed)
Anesthesia Post Note  Patient: Collie Wernick  Procedure(s) Performed: INTRAMEDULLARY (IM) NAIL INTERTROCHANTRIC (Left )     Patient location during evaluation: PACU Anesthesia Type: General Level of consciousness: awake and alert Pain management: pain level controlled Vital Signs Assessment: post-procedure vital signs reviewed and stable Respiratory status: spontaneous breathing, nonlabored ventilation and respiratory function stable Cardiovascular status: blood pressure returned to baseline and stable Postop Assessment: no apparent nausea or vomiting Anesthetic complications: no    Last Vitals:  Vitals:   09/14/19 1300 09/14/19 1315  BP: 98/77 107/90  Pulse: (!) 110 (!) 110  Resp: 16 16  Temp: (!) 36.1 C   SpO2: 95% 97%    Last Pain:  Vitals:   09/14/19 0755  TempSrc: Oral  PainSc:                  Maxximus Gotay A.

## 2019-09-15 ENCOUNTER — Other Ambulatory Visit (HOSPITAL_COMMUNITY): Payer: Medicare Other

## 2019-09-15 DIAGNOSIS — I420 Dilated cardiomyopathy: Secondary | ICD-10-CM | POA: Diagnosis not present

## 2019-09-15 DIAGNOSIS — I340 Nonrheumatic mitral (valve) insufficiency: Secondary | ICD-10-CM | POA: Diagnosis not present

## 2019-09-15 DIAGNOSIS — I480 Paroxysmal atrial fibrillation: Secondary | ICD-10-CM | POA: Diagnosis not present

## 2019-09-15 DIAGNOSIS — S72142D Displaced intertrochanteric fracture of left femur, subsequent encounter for closed fracture with routine healing: Secondary | ICD-10-CM | POA: Diagnosis not present

## 2019-09-15 LAB — CBC
HCT: 43.3 % (ref 39.0–52.0)
Hemoglobin: 14.5 g/dL (ref 13.0–17.0)
MCH: 32.7 pg (ref 26.0–34.0)
MCHC: 33.5 g/dL (ref 30.0–36.0)
MCV: 97.5 fL (ref 80.0–100.0)
Platelets: 103 10*3/uL — ABNORMAL LOW (ref 150–400)
RBC: 4.44 MIL/uL (ref 4.22–5.81)
RDW: 16.5 % — ABNORMAL HIGH (ref 11.5–15.5)
WBC: 5.9 10*3/uL (ref 4.0–10.5)
nRBC: 0 % (ref 0.0–0.2)

## 2019-09-15 LAB — HEPARIN LEVEL (UNFRACTIONATED): Heparin Unfractionated: 0.12 IU/mL — ABNORMAL LOW (ref 0.30–0.70)

## 2019-09-15 MED ORDER — WARFARIN - PHARMACIST DOSING INPATIENT
Freq: Every day | Status: DC
  Administered 2019-09-16 – 2019-09-21 (×4)

## 2019-09-15 MED ORDER — HEPARIN (PORCINE) 25000 UT/250ML-% IV SOLN
1400.0000 [IU]/h | INTRAVENOUS | Status: DC
  Administered 2019-09-15: 1000 [IU]/h via INTRAVENOUS
  Administered 2019-09-16: 1400 [IU]/h via INTRAVENOUS
  Filled 2019-09-15 (×3): qty 250

## 2019-09-15 MED ORDER — WARFARIN SODIUM 2.5 MG PO TABS
2.5000 mg | ORAL_TABLET | Freq: Once | ORAL | Status: AC
  Administered 2019-09-15: 2.5 mg via ORAL
  Filled 2019-09-15 (×2): qty 1

## 2019-09-15 NOTE — Plan of Care (Signed)
  Problem: Education: Goal: Knowledge of General Education information will improve Description: Including pain rating scale, medication(s)/side effects and non-pharmacologic comfort measures Outcome: Progressing   Problem: Coping: Goal: Level of anxiety will decrease Outcome: Progressing   Problem: Pain Managment: Goal: General experience of comfort will improve Outcome: Progressing   

## 2019-09-15 NOTE — Progress Notes (Addendum)
[facility] Progress Note  Patient ID: [name] [name] MRN: [id number] DOB/AGE: [birth year] 85 y.o.  Subjective:  Doing well No specific complaints Has worked with therapy today Left hip pain is tolerable Denies pain elsewhere   ROS As above  Objective:   VITALS:   Vitals:   09/14/19 2024 09/15/19 0457 09/15/19 0800 09/15/19 1333  BP:  97/78 103/78 101/75  Pulse: (!) 109 99 88 99  Resp:   18 18  Temp:  97.9 F (36.6 C) 97.7 F (36.5 C) (!) 97.5 F (36.4 C)  TempSrc:  Oral Oral Oral  SpO2: 97% 98% 93% 98%  Weight:      Height:        Estimated body mass index is 25.82 kg/m as calculated from the following:   Height as of this encounter: 5\' 8"  (1.727 m).   Weight as of this encounter: 77 kg.   Intake/Output      10/23 0701 - 10/24 0700 10/24 0701 - 10/25 0700   P.O. 480 480   I.V. (mL/kg) 800 (10.4)    Other 50    IV Piggyback 100    Total Intake(mL/kg) 1430 (18.6) 480 (6.2)   Urine (mL/kg/hr) 400 (0.2) 800 (1.1)   Blood 100    Total Output 500 800   Net +930 -320        Urine Occurrence 1 x      LABS  Results for orders placed or performed during the hospital encounter of 09/12/19 (from the past 24 hour(s))  CBC     Status: Abnormal   Collection Time: 09/15/19  4:05 AM  Result Value Ref Range   WBC 5.9 4.0 - 10.5 K/uL   RBC 4.44 4.22 - 5.81 MIL/uL   Hemoglobin 14.5 13.0 - 17.0 g/dL   HCT 43.3 39.0 - 52.0 %   MCV 97.5 80.0 - 100.0 fL   MCH 32.7 26.0 - 34.0 pg   MCHC 33.5 30.0 - 36.0 g/dL   RDW 16.5 (H) 11.5 - 15.5 %   Platelets 103 (L) 150 - 400 K/uL   nRBC 0.0 0.0 - 0.2 %   Results for [name] [name], [name] [name] (MRN [id number]) as of 09/15/2019 16:15  Ref. Range 09/14/2019 14:32  Vitamin D, 25-Hydroxy Latest Ref Range: 30 - 100 ng/mL 45.00    PHYSICAL EXAM:   Gen: Resting comfortably in bed, no acute distress, very pleasant Lungs: Clear anterior fields Cardiac:  Irregularly irregular Abd:NT + BS Ext:       Left lower extremity  Dressing with scant drainage but is otherwise intact  No significant swelling distally  Knee is nontender  Extremity is warm  No deep calf tenderness  Compartments are soft and nontender  DPN, SPN, TN sensory functions are grossly intact  EHL, FHL, lesser toe motor functions are grossly intact.  Ankle flexion extension, inversion and eversion are intact  Patient can perform a quad set  Assessment/Plan: 1 Day Post-Op   Active Problems:   Femur fracture, left ([place])   Anti-infectives (From admission, onward)   Start     Dose/Rate Route Frequency Ordered Stop   09/14/19 1930  ceFAZolin (ANCEF) IVPB 2g/100 mL premix     2 g 200 mL/hr over 30 Minutes Intravenous Every 8 hours 09/14/19 1414 09/15/19 1533   09/14/19 1210  vancomycin (  VANCOCIN) powder  Status:  Discontinued       As needed 09/14/19 1145 09/14/19 1255   09/14/19 0600  ceFAZolin (ANCEF) IVPB 2g/100 mL premix     2 g 200 mL/hr over 30 Minutes Intravenous To Short Stay 09/14/19 0454 09/14/19 1120    .  POD/HD#: 24  85 year old male with history of A. fib on chronic anticoagulation along with history of mitral valve replacement and Parkinson's disease s/p fall with left hip fracture  -Fall  -Left intertrochanteric femur fracture s/p IMN   Weight-bear as tolerated with assistance  Range of motion as tolerated left hip and knee  Dressing changes starting tomorrow, 09/16/2019  PT and OT  Ice as needed   CIR consult  - Pain management:  Continue with scheduled Tylenol 650 mg every 6 hours  Norco 5/325 1 every 6 hours as needed for severe breakthrough pain   Current regimen as written does not exceed 4000 mg of Tylenol in a 24-hour period  - ABL anemia/Hemodynamics  Monitor  Cbc in am   - Medical issues   Per primary and cardiology  - DVT/PE prophylaxis:  Currently on heparin  Okay to resume Coumadin today.  Order placed for pharmacy consult    H&H looks good, platelets are above 100,000   Continue to monitor  - ID:   Perioperative antibiotics  - Metabolic Bone Disease:  Vitamin D levels look great   Continue with current supplementation  However if fracture is suggestive of poor bone quality in and of itself   Patient does have several risk factors for increase incidence of osteoporosis being Parkinson's disease which in and of itself is associated with osteoporosis due to impaired mobility.   Patient is also on Lexapro which is also associated with diminished bone density   Lasix use is also associated with poor bone density as well due to calcium excretion   Would recommend bone density scan in the outpatient setting in 4 to 8 weeks and treating accordingly.  - Activity:  Weight-bear as tolerated with assistance   - Impediments to fracture healing:  Low-energy fall  Presumed osteoporosis/osteopenia  Parkinson's disease which increases fall risk in and of itself  - Dispo:  Continue with therapies  Ortho issues are stable  Inpatient rehabilitation consult per therapy recommendations     Mearl Latin, PA-C [phone] (C) 09/15/2019, 4:27 PM  [facility] [address] [place] [address] [phone] [name] (F)

## 2019-09-15 NOTE — Progress Notes (Signed)
ANTICOAGULATION CONSULT NOTE - Follow Up Consult  Pharmacy Consult for Heparin >> warfarin Indication: atrial fibrillation  Allergies  Allergen Reactions  . Apixaban Palpitations, Rash and Other (See Comments)    Worsening tardive dyskinesia and rapid heart rate  . Ace Inhibitors Cough  . Demerol [Meperidine] Other (See Comments)    Parkinsons disease  . Poison Ivy Extract Itching, Swelling and Rash    Patient Measurements: Height: 5\' 8"  (172.7 cm) Weight: 169 lb 12.8 oz (77 kg) IBW/kg (Calculated) : 68.4  Vital Signs: Temp: 97.5 F (36.4 C) (10/24 1333) Temp Source: Oral (10/24 1333) BP: 101/75 (10/24 1333) Pulse Rate: 99 (10/24 1333)  Labs: Recent Labs    09/12/19 1925 09/13/19 0520 09/13/19 1954 09/14/19 0245 09/14/19 1432 09/15/19 0405  HGB 14.8  --   --   --  15.4 14.5  HCT 45.6  --   --   --  48.4 43.3  PLT 108*  --   --   --  108* 103*  LABPROT 33.7* 36.8* 21.2* 17.6*  --   --   INR 3.4* 3.8* 1.9* 1.5*  --   --   CREATININE 1.20  --   --   --  1.04  --     Estimated Creatinine Clearance: 50.2 mL/min (by C-G formula based on SCr of 1.04 mg/dL).  Assessment: 85 year old male on warfarin prior to admission for Afib, now s/p hip surgery. Patient was reversed with vitamin K 10mg  IV x 1 on 10/22, and heparin was started post-op on 10/24. Pharmacy consulted to resume warfarin - INR down to 1.5 (from 10/23). Hg wnl, plt 103 stable. No active bleed issues documented.  PTA warfarin dose:  2.5mg  daily (last dose 10/21 PTA)  Goal of Therapy:  Heparin level 0.3-0.7 units/ml  INR 2-3 Monitor platelets by anticoagulation protocol: Yes   Plan:  Continue heparin drip at 1000 units / hr while subtherapeutic Warfarin 2.5mg  PO x 1 tonight Heparin level at 1900 Monitor daily heparin level/INR/CBC, s/sx bleeding   Elicia Lamp, PharmD, BCPS Please check AMION for all [facility] contact numbers Clinical Pharmacist 09/15/2019 4:26 PM

## 2019-09-15 NOTE — Progress Notes (Signed)
ANTICOAGULATION CONSULT NOTE - Follow Up Consult  Pharmacy Consult for Heparin Indication: atrial fibrillation  Allergies  Allergen Reactions  . Apixaban Palpitations, Rash and Other (See Comments)    Worsening tardive dyskinesia and rapid heart rate  . Ace Inhibitors Cough  . Demerol [Meperidine] Other (See Comments)    Parkinsons disease  . Poison Ivy Extract Itching, Swelling and Rash    Patient Measurements: Height: 5\' 8"  (172.7 cm) Weight: 169 lb 12.8 oz (77 kg) IBW/kg (Calculated) : 68.4  Vital Signs: Temp: 97.7 F (36.5 C) (10/24 0800) Temp Source: Oral (10/24 0800) BP: 103/78 (10/24 0800) Pulse Rate: 88 (10/24 0800)  Labs: Recent Labs    09/12/19 1925 09/13/19 0520 09/13/19 1954 09/14/19 0245 09/14/19 1432 09/15/19 0405  HGB 14.8  --   --   --  15.4 14.5  HCT 45.6  --   --   --  48.4 43.3  PLT 108*  --   --   --  108* 103*  LABPROT 33.7* 36.8* 21.2* 17.6*  --   --   INR 3.4* 3.8* 1.9* 1.5*  --   --   CREATININE 1.20  --   --   --  1.04  --     Estimated Creatinine Clearance: 50.2 mL/min (by C-G formula based on SCr of 1.04 mg/dL).  Assessment: 85 year old male on warfarin prior to admission for Afib now s/p hip surgery to begin heparin  Goal of Therapy:  Heparin level 0.3-0.7 units/ml Monitor platelets by anticoagulation protocol: Yes   Plan:  No bolus due to recent surgery Heparin drip at 1000 units / hr Heparin level 8 hours after heparin begins Daily heparin level, CBC  Follow up for resumption of warfarin  Thank you Anette Guarneri, PharmD  09/15/2019,9:44 AM

## 2019-09-15 NOTE — Evaluation (Signed)
Physical Therapy Evaluation Patient Details Name: [name] [name] MRN: [id number] DOB: 09-07-1932 Today's Date: 09/15/2019   History of Present Illness  Pt is a 85 yr old male who had a fall and s/p Cephalomedullary nailing of left intertrochanteric femur fracture. PMH but not limited to: dyslipidemia, parkinson's s/p mitral valve replacement, stroke   Clinical Impression  Pt s/p surgery listed above. Pt presents with decreased functional mobility secondary to expected post surgical pain, decreased LLE range of motion, left leg weakness, and balance impairments. Requiring moderate assist to stand; performed pre gait activities and took a couple steps using walker. HR stable. Pt demonstrates good activity tolerance throughout session and initiated bed level exercise program for LLE strengthening. Prior to admission, pt requires assist for ADL's and is ambulatory with a Rollator. Pt wife reports he loves to exercise and participate in therapy. Given recent injury in conjunction with comorbidities of Parkinson's disease and cardiac issues, would benefit from CIR to maximize functional mobility and decrease caregiver burden.      Follow Up Recommendations CIR;Supervision/Assistance - 24 hour    Equipment Recommendations  Wheelchair (measurements PT)    Recommendations for Other Services Rehab consult     Precautions / Restrictions Precautions Precautions: Fall Restrictions Weight Bearing Restrictions: Yes LLE Weight Bearing: Weight bearing as tolerated      Mobility  Bed Mobility Overal bed mobility: Needs Assistance Bed Mobility: Supine to Sit;Sit to Supine     Supine to sit: Max assist Sit to supine: Mod assist   General bed mobility comments: Cues for bending right knee and reaching with RUE, maxA to guide legs off bed and for trunk elevation. [place] upon return to bed with BLE elevation; multimodal cueing for sequencing  Transfers Overall transfer level: Needs assistance Equipment  used: Rolling walker (2 wheeled) Transfers: Sit to/from Stand Sit to Stand: Mod assist;From elevated surface         General transfer comment: Heavy modA from elevated bed height, cues for hand/foot placement, rocking forward to gain momentum  Ambulation/Gait Ambulation/Gait assistance: Min assist Gait Distance (Feet): 2 Feet Assistive device: Rolling walker (2 wheeled) Gait Pattern/deviations: Step-to pattern;Decreased stride length;Trunk flexed;Narrow base of support;Decreased weight shift to left     General Gait Details: Pt performing pre gait training including weight shifting, stepping in place and took 2 steps forward and back with min assist. Decreased weightbearing through LLE  Stairs            Wheelchair Mobility    Modified Rankin (Stroke Patients Only)       Balance Overall balance assessment: Needs assistance Sitting-balance support: Feet supported;Bilateral upper extremity supported Sitting balance-Leahy Scale: Fair   Postural control: Right lateral lean Standing balance support: Bilateral upper extremity supported Standing balance-Leahy Scale: Poor                               Pertinent Vitals/Pain Pain Assessment: Faces Pain Score: 1  Faces Pain Scale: Hurts little more Pain Location: LLE with movement Pain Descriptors / Indicators: Aching;Grimacing;Guarding Pain Intervention(s): Monitored during session    Home Living Family/patient expects to be discharged to:: Private residence Living Arrangements: Spouse/significant other Available Help at Discharge: Available 24 hours/day Type of Home: House Home Access: Level entry     Home Layout: One level Home Equipment: Environmental consultant - 4 wheels;Walker - 2 wheels;Bedside commode Additional Comments: pt reports multiple walkers in home    Prior Function Level of Independence: Needs  assistance   Gait / Transfers Assistance Needed: uses Rollator  ADL's / Homemaking Assistance Needed: wife  assisting as needed for showers/dressing/household items        Hand Dominance   Dominant Hand: Right    Extremity/Trunk Assessment   Upper Extremity Assessment Upper Extremity Assessment: Generalized weakness LUE Deficits / Details: per pt had chronic weakness in LUE but reported sore since fall LUE Sensation: WNL LUE Coordination: WNL    Lower Extremity Assessment Lower Extremity Assessment: RLE deficits/detail;LLE deficits/detail RLE Deficits / Details: Strength 5/5 LLE Deficits / Details: Unable to perform SLR, able to do quad set with decreased activation, ankle dorsiflexion/plantarflexion 5/5    Cervical / Trunk Assessment Cervical / Trunk Assessment: Kyphotic  Communication   Communication: No difficulties  Cognition Arousal/Alertness: Awake/alert Behavior During Therapy: Flat affect Overall Cognitive Status: History of cognitive impairments - at baseline                                 General Comments: Pt very pleasant, following all simple commands with increased time. Pt wife tending to speak for pt      General Comments      Exercises General Exercises - Lower Extremity Quad Sets: Left;10 reps;Supine Hip ABduction/ADduction: Left;10 reps;Supine   Assessment/Plan    PT Assessment Patient needs continued PT services  PT Problem List Decreased strength;Decreased range of motion;Decreased activity tolerance;Decreased balance;Decreased mobility;Decreased coordination;Decreased cognition;Decreased safety awareness;Pain       PT Treatment Interventions DME instruction;Gait training;Functional mobility training;Therapeutic activities;Therapeutic exercise;Balance training;Patient/family education;Wheelchair mobility training    PT Goals (Current goals can be found in the Care Plan section)  Acute Rehab PT Goals Patient Stated Goal: to get stronger PT Goal Formulation: With patient/family Time For Goal Achievement: 09/29/19 Potential to  Achieve Goals: Good    Frequency Min 5X/week   Barriers to discharge        Co-evaluation               AM-PAC PT "6 Clicks" Mobility  Outcome Measure Help needed turning from your back to your side while in a flat bed without using bedrails?: A Lot Help needed moving from lying on your back to sitting on the side of a flat bed without using bedrails?: Total Help needed moving to and from a bed to a chair (including a wheelchair)?: A Little Help needed standing up from a chair using your arms (e.g., wheelchair or bedside chair)?: A Lot Help needed to walk in hospital room?: A Lot Help needed climbing 3-5 steps with a railing? : Total 6 Click Score: 11    End of Session Equipment Utilized During Treatment: Gait belt Activity Tolerance: Patient tolerated treatment well Patient left: in bed;with call bell/phone within reach;with bed alarm set;with family/visitor present Nurse Communication: Mobility status PT Visit Diagnosis: Pain;Difficulty in walking, not elsewhere classified (R26.2);Unsteadiness on feet (R26.81) Pain - Right/Left: Left Pain - part of body: Hip    Time: 3790-2409 PT Time Calculation (min) (ACUTE ONLY): 28 min   Charges:   PT Evaluation $PT Eval Moderate Complexity: 1 Mod PT Treatments $Gait Training: 8-22 mins        Laurina Bustle, PT, DPT Acute Rehabilitation Services Pager [phone] Office [phone]   Vanetta Mulders 09/15/2019, 1:04 PM

## 2019-09-15 NOTE — Progress Notes (Addendum)
Occupational Therapy Treatment Patient Details Name: [name] [name] MRN: [id number] DOB: 05/21/1932 Today's Date: 09/15/2019    History of present illness Pt is a 85 yr old male who had a fall and s/p Cephalomedullary nailing of left intertrochanteric femur fracture. PMH but not limited to: dyslipidemia, parkinson's s/p mitral valve replacement, stroke    OT comments  Pt admitted due to fall and s/p Cephalomedullary nailing of left intertrochanteric femur fracture Pt currently with functional limitations due to the deficits listed below (see OT Problem List). On this visit the wife was present and discussed current level of function at this time and SNF level of therapy following discharge from hospital at this time. Pt's wife agreded at this time.     Follow Up Recommendations  CIR;Supervision/Assistance - 24 hour (changed since last note due to participation with family present and speaking with Physical Therapy)    Equipment Recommendations       Recommendations for Other Services      Precautions / Restrictions Precautions Precautions: Fall Restrictions Weight Bearing Restrictions: Yes LLE Weight Bearing: Weight bearing as tolerated       Mobility Bed Mobility Overal bed mobility: Needs Assistance Bed Mobility: Supine to Sit;Sit to Supine     Supine to sit: Min assist;HOB elevated Sit to supine: Mod assist;HOB elevated   General bed mobility comments: increase time and cues for hand placement   Transfers Overall transfer level: Needs assistance Equipment used: Rolling walker (2 wheeled) Transfers: Sit to/from Stand Sit to Stand: Mod assist;From elevated surface         General transfer comment: increase time and several attmepts    Balance Overall balance assessment: Needs assistance Sitting-balance support: Feet supported;Bilateral upper extremity supported Sitting balance-Leahy Scale: Fair   Postural control: Right lateral lean Standing balance support:  Bilateral upper extremity supported Standing balance-Leahy Scale: Poor                             ADL either performed or assessed with clinical judgement   ADL Overall ADL's : Needs assistance/impaired Eating/Feeding: Set up;Sitting   Grooming: Wash/dry hands;Wash/dry face;Supervision/safety;Min guard;Sitting   Upper Body Bathing: Cueing for safety;Cueing for sequencing;Sitting;Min guard   Lower Body Bathing: Cueing for safety;Cueing for sequencing;Sit to/from stand;Maximal assistance   Upper Body Dressing : Min guard;Cueing for safety;Cueing for sequencing;Sitting   Lower Body Dressing: Maximal assistance;Cueing for safety;Cueing for sequencing;Sit to/from stand   Toilet Transfer: Moderate assistance;Comfort height toilet;RW   Toileting- Clothing Manipulation and Hygiene: Moderate assistance;Cueing for safety;Cueing for sequencing;Sit to/from stand   Tub/ Engineer, structural: Cueing for safety;Cueing for sequencing;Maximal assistance   Functional mobility during ADLs: Moderate assistance;Cueing for safety;Cueing for sequencing;Rolling walker       Vision Baseline Vision/History: Wears glasses Wears Glasses: Reading only;Distance only Vision Assessment?: No apparent visual deficits   Perception Perception Perception Tested?: No   Praxis Praxis Praxis tested?: Not tested    Cognition Arousal/Alertness: Lethargic Behavior During Therapy: Flat affect Overall Cognitive Status: (pt family reported needs cues to open eyes to engage )                                 General Comments: pt would close eyes during session        Exercises     Shoulder Instructions       General Comments      Pertinent Vitals/ Pain  Pain Assessment: 0-10 Pain Score: 1  Pain Location: LLE Pain Descriptors / Indicators: Aching Pain Intervention(s): Monitored during session;Limited activity within patient's tolerance;Repositioned  Home Living  Family/patient expects to be discharged to:: Private residence Living Arrangements: Spouse/significant other Available Help at Discharge: Available 24 hours/day Type of Home: House Home Access: Level entry     Home Layout: One level     Bathroom Shower/Tub: Occupational psychologist: Standard Bathroom Accessibility: Yes How Accessible: Accessible via walker Home Equipment: [place] - 4 wheels;Walker - 2 wheels;Bedside commode   Additional Comments: pt reports multiple walkers in home      Prior Functioning/Environment Level of Independence: Needs assistance    ADL's / Homemaking Assistance Needed: wife assisiting as needed for showers/dressing/household items       Frequency  Min 2X/week        Progress Toward Goals  OT Goals(current goals can now be found in the care plan section)  Progress towards OT goals: Progressing toward goals  Acute Rehab OT Goals Patient Stated Goal: to go to rehab  OT Goal Formulation: With patient Time For Goal Achievement: 09/22/19 Potential to Achieve Goals: Good ADL Goals Pt Will Perform Grooming: with min assist;standing Pt Will Perform Upper Body Dressing: with modified independence;sitting Pt Will Perform Lower Body Dressing: with min assist;sit to/from stand Pt Will Transfer to Toilet: with min assist;bedside commode  Plan Discharge plan remains appropriate    Co-evaluation                 AM-PAC OT "6 Clicks" Daily Activity     Outcome Measure   Help from another person eating meals?: A Little Help from another person taking care of personal grooming?: A Lot Help from another person toileting, which includes using toliet, bedpan, or urinal?: A Lot Help from another person bathing (including washing, rinsing, drying)?: A Lot Help from another person to put on and taking off regular upper body clothing?: A Little Help from another person to put on and taking off regular lower body clothing?: A Lot 6 Click  Score: 14    End of Session Equipment Utilized During Treatment: Gait belt  OT Visit Diagnosis: Unsteadiness on feet (R26.81);Repeated falls (R29.6);Muscle weakness (generalized) (M62.81);Pain Pain - Right/Left: Left Pain - part of body: Hip   Activity Tolerance Patient tolerated treatment well   Patient Left in bed;with call bell/phone within reach;with bed alarm set;with family/visitor present   Nurse Communication Mobility status        Time: 1036-1050 OT Time Calculation (min): 14 min  Charges: OT General Charges $OT Visit: 1 Visit OT Evaluation $OT Eval Low Complexity: 1 Low OT Treatments $Self Care/Home Management : 8-22 mins  Joeseph Amor OTR/L  Acute Rehab Services  [phone] office number [phone] pager number    Joeseph Amor 09/15/2019, 11:10 AM

## 2019-09-15 NOTE — Progress Notes (Addendum)
Rehab Admissions Coordinator Note:  Per PT and OT recommendation, this patient was screened by Raechel Ache for appropriateness for an Inpatient Acute Rehab Consult.  At this time, we are recommending Inpatient Rehab consult. AC will contact MD to request order.   Raechel Ache 09/15/2019, 2:10 PM  I can be reached at [phone].

## 2019-09-15 NOTE — Progress Notes (Signed)
Progress Note  Patient Name: [name] [name] Date of Encounter: 09/15/2019  Primary Cardiologist: Pixie Casino, MD   Subjective   Denies any chest pain or SOB  Inpatient Medications    Scheduled Meds: . acetaminophen  650 mg Oral Q6H  . amantadine  100 mg Oral BID  . atorvastatin  20 mg Oral QPM  . carbidopa-levodopa  3 tablet Oral 2 times per day   And  . carbidopa-levodopa  2 tablet Oral q1800   And  . carbidopa-levodopa  1 tablet Oral QHS  . donepezil  10 mg Oral QHS  . escitalopram  10 mg Oral Daily  . metoprolol succinate  12.5 mg Oral Daily  . rotigotine  1 patch Transdermal Daily  . selegiline  5 mg Oral QAC breakfast   And  . selegiline  2.5 mg Oral QAC supper   Continuous Infusions: .  ceFAZolin (ANCEF) IV 2 g (09/15/19 0552)  . heparin    . lactated ringers Stopped (09/14/19 1300)   PRN Meds: bisacodyl, HYDROcodone-acetaminophen, methocarbamol, morphine injection   Vital Signs    Vitals:   09/14/19 2001 09/14/19 2024 09/15/19 0457 09/15/19 0800  BP: 93/79  97/78 103/78  Pulse: (!) 43 (!) 109 99 88  Resp:    18  Temp: 97.7 F (36.5 C)  97.9 F (36.6 C) 97.7 F (36.5 C)  TempSrc: Oral  Oral Oral  SpO2: 99% 97% 98% 93%  Weight:      Height:        Intake/Output Summary (Last 24 hours) at 09/15/2019 1025 Last data filed at 09/15/2019 0900 Gross per 24 hour  Intake 1670 ml  Output 900 ml  Net 770 ml   Last 3 Weights 09/14/2019 09/05/2019 08/24/2019  Weight (lbs) 169 lb 12.8 oz 169 lb 12.8 oz 177 lb 9.6 oz  Weight (kg) 77.021 kg 77.021 kg 80.559 kg      Telemetry    Atrial fibrillation  - Personally Reviewed  ECG    No new EKG to review  Physical Exam   GEN: Well nourished, well developed in no acute distress HEENT: Normal NECK: No JVD; No carotid bruits LYMPHATICS: No lymphadenopathy CARDIAC:irregularly irregular, no murmurs, rubs, gallops RESPIRATORY:  Clear to auscultation without rales, wheezing or rhonchi  ABDOMEN: Soft,  non-tender, non-distended MUSCULOSKELETAL:  No edema; No deformity  SKIN: Warm and dry NEUROLOGIC:  Alert and oriented x 3 PSYCHIATRIC:  Normal affect    Labs    High Sensitivity Troponin:  No results for input(s): TROPONINIHS in the last 720 hours.    Chemistry Recent Labs  Lab 09/10/19 1042 09/12/19 1925 09/14/19 1432  NA 143 139 142  K 4.7 4.0 3.5  CL 102 103 106  CO2 27 27 26   GLUCOSE 104* 139* 123*  BUN 26 26* 23  CREATININE 1.08 1.20 1.04  CALCIUM 9.0 9.0 8.2*  GFRNONAA 62 55* >60  GFRAA 72 >60 >60  ANIONGAP  --  9 10     Hematology Recent Labs  Lab 09/12/19 1925 09/14/19 1432 09/15/19 0405  WBC 7.8 7.4 5.9  RBC 4.63 4.89 4.44  HGB 14.8 15.4 14.5  HCT 45.6 48.4 43.3  MCV 98.5 99.0 97.5  MCH 32.0 31.5 32.7  MCHC 32.5 31.8 33.5  RDW 16.5* 16.6* 16.5*  PLT 108* 108* 103*    BNPNo results for input(s): BNP, PROBNP in the last 168 hours.   DDimer No results for input(s): DDIMER in the last 168 hours.   Radiology  Dg C-arm 1-60 Min  Result Date: 09/14/2019 CLINICAL DATA:  Left femoral nail placement. EXAM: OPERATIVE left HIP (WITH PELVIS IF PERFORMED) 5 VIEWS TECHNIQUE: Fluoroscopic spot image(s) were submitted for interpretation post-operatively. FLUOROSCOPY TIME:  2 minutes 25 seconds. COMPARISON:  September 12, 2019. FINDINGS: Five intraoperative fluoroscopic images of the left hip demonstrate the patient be status post surgical internal fixation of intertrochanteric fracture of the proximal left femur. IMPRESSION: Fluoroscopic guidance provided during surgical internal fixation of intertrochanteric fracture of proximal left femur. Electronically Signed   By: Lupita Raider M.D.   On: 09/14/2019 12:48   Dg Hip Operative Unilat W Or W/o Pelvis Left  Result Date: 09/14/2019 CLINICAL DATA:  Left femoral nail placement. EXAM: OPERATIVE left HIP (WITH PELVIS IF PERFORMED) 5 VIEWS TECHNIQUE: Fluoroscopic spot image(s) were submitted for interpretation  post-operatively. FLUOROSCOPY TIME:  2 minutes 25 seconds. COMPARISON:  September 12, 2019. FINDINGS: Five intraoperative fluoroscopic images of the left hip demonstrate the patient be status post surgical internal fixation of intertrochanteric fracture of the proximal left femur. IMPRESSION: Fluoroscopic guidance provided during surgical internal fixation of intertrochanteric fracture of proximal left femur. Electronically Signed   By: Lupita Raider M.D.   On: 09/14/2019 12:48   Dg Hip Unilat W Or W/o Pelvis 2-3 Views Left  Result Date: 09/14/2019 CLINICAL DATA:  Left femur ORIF. EXAM: DG HIP (WITH OR WITHOUT PELVIS) 2-3V LEFT COMPARISON:  Left hip x-rays from same day and September 12, 2019. FINDINGS: Interval left intertrochanteric femur fracture cephalomedullary nail fixation. Postsurgical changes and subcutaneous emphysema about the left lateral hip. IMPRESSION: 1. Interval left intertrochanteric femur fracture ORIF without acute postoperative complication. Electronically Signed   By: Obie Dredge M.D.   On: 09/14/2019 13:50    Cardiac Studies   Echo 08/2019 1. Left ventricular ejection fraction, by visual estimation, is 45 to 50%. The left ventricle has mildly decreased function. Mildly increased left ventricular size. There is no left ventricular hypertrophy. Mild diffuse hypokinesis. 2. Left ventricular diastolic Doppler parameters are indeterminate pattern of LV diastolic filling. 3. Global right ventricle has moderately reduced systolic function.The right ventricular size is mildly enlarged. No increase in right ventricular wall thickness. 4. The patient is status post mitral valve repair. There appears to be severe mitral regurgitation, eccentric and posteriorly-directed. The mean gradient was 7 mmHg but MVA was normal by PHT, I think that the elevated mean gradient is due to high flow  from mitral regurgitation rather than significant mitral stenosis. 5. The aortic valve is  tricuspid Aortic valve regurgitation is mild by color flow Doppler. Mild aortic valve sclerosis without stenosis. 6. There is mild dilatation of the aortic root measuring 39 mm. 7. The tricuspid valve is normal in structure. Tricuspid valve regurgitation moderate. 8. Left atrial size was severely dilated. 9. Right atrial size was severely dilated. 10. The IVC was not visualized. Peak RV-RA gradient 28 mmHg.   Patient Profile   [name] [name] is a 85 y.o. male with a hx of endocarditis s/p mitral valve repair in 2008, cryptogenic stroke with subsequent discover of paroxysmal Afib by implantable loop recorder, hyperlipidemia, and Parkinson's disease who is being seen for the evaluation of pre-operative evaluation for left intertrochanteric fx repair at the request of Dr. Blake Divine.  Assessment & Plan    1.  Hip fx -s/p repair POD #1  2. PAF   -had recurrent afib post op -remains in afib in the 90's -now on IV Heparin gtt  -continue BB  for rate control -BP too soft to uptitrate BB for better rate control -INR pending today -restart coumadin when ok with Ortho  3.  S/p MV repair with severe MR by recent echo -Plan for eval with TEE after he recovers from hip surgery -Pt set for next week but probably postpont    4.  Mild LV dysfunction -EF 45-50% on echo earlier this month -appears euvolemic on exam   For questions or updates, please contact [facility] Please consult [email or web address] for contact info under        Signed, Armanda Magicraci Turner, MD  09/15/2019, 10:25 AM

## 2019-09-15 NOTE — Evaluation (Signed)
Occupational Therapy Evaluation Patient Details Name: [name] HensenJohn Brickey MRN: [id number] DOB: 10/29/1932 Today's Date: 09/15/2019    History of Present Illness Pt is a 85 yr old male who had a fall and s/p Cephalomedullary nailing of left intertrochanteric femur fracture. PMH but not limited to: dyslipidemia, parkinson's s/p mitral valve replacement, stroke    Clinical Impression   Pt admitted with fall and had s/p Cephalomedullary nailing of left intertrochanteric femur fracture. Pt currently with functional limitations due to the deficits listed below (see OT Problem List). Pt requires min to moderate assist with supine to sitting, sit to stand with elevated surface moderate assist and moderate assist with RW to take side steps [facility] and moderate assist from sitting to supine. Pt required reminders to keep eyes open and posture while sitting at EOB as would latterly lean to R side. Pt will benefit from skilled OT to increase their safety and independence with ADL and functional mobility for ADL to facilitate discharge to venue listed below.       Follow Up Recommendations  SNF;Supervision/Assistance - 24 hour    Equipment Recommendations       Recommendations for Other Services       Precautions / Restrictions Precautions Precautions: Fall Restrictions Weight Bearing Restrictions: Yes LLE Weight Bearing: Weight bearing as tolerated      Mobility Bed Mobility Overal bed mobility: Needs Assistance Bed Mobility: Supine to Sit;Sit to Supine     Supine to sit: Min assist;HOB elevated Sit to supine: Mod assist;HOB elevated   General bed mobility comments: increase time and cues for hand placement   Transfers Overall transfer level: Needs assistance Equipment used: Rolling walker (2 wheeled) Transfers: Sit to/from Stand Sit to Stand: Mod assist;From elevated surface         General transfer comment: increase time and several attmepts    Balance Overall balance assessment:  Needs assistance Sitting-balance support: Feet supported;Bilateral upper extremity supported Sitting balance-Leahy Scale: Fair   Postural control: Right lateral lean Standing balance support: Bilateral upper extremity supported Standing balance-Leahy Scale: Poor                             ADL either performed or assessed with clinical judgement   ADL Overall ADL's : Needs assistance/impaired Eating/Feeding: Set up;Sitting   Grooming: Wash/dry hands;Wash/dry face;Supervision/safety;Min guard;Sitting   Upper Body Bathing: Cueing for safety;Cueing for sequencing;Sitting;Min guard   Lower Body Bathing: Cueing for safety;Cueing for sequencing;Sit to/from stand;Maximal assistance   Upper Body Dressing : Min guard;Cueing for safety;Cueing for sequencing;Sitting   Lower Body Dressing: Maximal assistance;Cueing for safety;Cueing for sequencing;Sit to/from stand   Toilet Transfer: Moderate assistance;Comfort height toilet;RW   Toileting- Clothing Manipulation and Hygiene: Moderate assistance;Cueing for safety;Cueing for sequencing;Sit to/from stand   Tub/ Engineer, structuralhower Transfer: Cueing for safety;Cueing for sequencing;Maximal assistance   Functional mobility during ADLs: Moderate assistance;Cueing for safety;Cueing for sequencing;Rolling walker       Vision Baseline Vision/History: Wears glasses Wears Glasses: Reading only;Distance only       Perception Perception Perception Tested?: No   Praxis Praxis Praxis tested?: Not tested    Pertinent Vitals/Pain Pain Assessment: 0-10 Pain Score: 1  Pain Location: LLE Pain Descriptors / Indicators: Aching Pain Intervention(s): Limited activity within patient's tolerance;Monitored during session;Repositioned     Hand Dominance Right   Extremity/Trunk Assessment Upper Extremity Assessment Upper Extremity Assessment: Generalized weakness;LUE deficits/detail LUE Deficits / Details: per pt had chronic weakness in LUE  but  reported sore since fall LUE Sensation: WNL LUE Coordination: WNL   Lower Extremity Assessment Lower Extremity Assessment: Defer to PT evaluation   Cervical / Trunk Assessment Cervical / Trunk Assessment: Kyphotic   Communication Communication Communication: Other (comment)(increase time)   Cognition Arousal/Alertness: Lethargic Behavior During Therapy: Flat affect Overall Cognitive Status: No family/caregiver present to determine baseline cognitive functioning                                 General Comments: pt would close eyes during session   General Comments       Exercises     Shoulder Instructions      Home Living Family/patient expects to be discharged to:: Private residence Living Arrangements: Spouse/significant other Available Help at Discharge: Available 24 hours/day Type of Home: House Home Access: Level entry     Home Layout: One level     Bathroom Shower/Tub: Occupational psychologist: Standard Bathroom Accessibility: Yes How Accessible: Accessible via walker Home Equipment: [place] - 4 wheels;Walker - 2 wheels;Bedside commode   Additional Comments: pt reports multiple walkers in home      Prior Functioning/Environment Level of Independence: Needs assistance    ADL's / Homemaking Assistance Needed: wife assisiting as needed for showers/dressing/household items            OT Problem List: Decreased strength;Decreased range of motion;Decreased activity tolerance;Impaired balance (sitting and/or standing);Decreased safety awareness;Decreased knowledge of use of DME or AE;Pain      OT Treatment/Interventions:      OT Goals(Current goals can be found in the care plan section) Acute Rehab OT Goals Patient Stated Goal: to get stronger OT Goal Formulation: With patient Time For Goal Achievement: 09/22/19 Potential to Achieve Goals: Good ADL Goals Pt Will Perform Grooming: with min assist;standing Pt Will Perform Upper  Body Dressing: with modified independence;sitting Pt Will Perform Lower Body Dressing: with min assist;sit to/from stand Pt Will Transfer to Toilet: with min assist;bedside commode    OT Frequency:  2x week   Barriers to D/C:            Co-evaluation              AM-PAC OT "6 Clicks" Daily Activity     Outcome Measure Help from another person eating meals?: A Little Help from another person taking care of personal grooming?: A Lot Help from another person toileting, which includes using toliet, bedpan, or urinal?: A Lot Help from another person bathing (including washing, rinsing, drying)?: A Lot Help from another person to put on and taking off regular upper body clothing?: A Little Help from another person to put on and taking off regular lower body clothing?: A Lot 6 Click Score: 14   End of Session Equipment Utilized During Treatment: Gait belt Nurse Communication: Mobility status  Activity Tolerance: Patient tolerated treatment well Patient left: in bed;with call bell/phone within reach;with bed alarm set  OT Visit Diagnosis: Unsteadiness on feet (R26.81);Repeated falls (R29.6);Muscle weakness (generalized) (M62.81);Pain Pain - Right/Left: Left Pain - part of body: Hip                Time: 9147-8295 OT Time Calculation (min): 28 min Charges:  OT General Charges $OT Visit: 1 Visit OT Evaluation $OT Eval Low Complexity: 1 Low OT Treatments $Self Care/Home Management : 8-22 mins  Joeseph Amor OTR/L  Acute Rehab Services  [phone] office number [phone] pager  number   Alphia Moh 09/15/2019, 11:00 AM

## 2019-09-15 NOTE — Progress Notes (Signed)
ANTICOAGULATION CONSULT NOTE - Follow Up Consult  Pharmacy Consult for Heparin >> warfarin Indication: atrial fibrillation  Allergies  Allergen Reactions  . Apixaban Palpitations, Rash and Other (See Comments)    Worsening tardive dyskinesia and rapid heart rate  . Ace Inhibitors Cough  . Demerol [Meperidine] Other (See Comments)    Parkinsons disease  . Poison Ivy Extract Itching, Swelling and Rash    Patient Measurements: Height: 5\' 8"  (172.7 cm) Weight: 169 lb 12.8 oz (77 kg) IBW/kg (Calculated) : 68.4  Vital Signs: Temp: 97.5 F (36.4 C) (10/24 1333) Temp Source: Oral (10/24 1333) BP: 101/75 (10/24 1333) Pulse Rate: 99 (10/24 1333)  Labs: Recent Labs    09/13/19 0520 09/13/19 1954 09/14/19 0245 09/14/19 1432 09/15/19 0405 09/15/19 1842  HGB  --   --   --  15.4 14.5  --   HCT  --   --   --  48.4 43.3  --   PLT  --   --   --  108* 103*  --   LABPROT 36.8* 21.2* 17.6*  --   --   --   INR 3.8* 1.9* 1.5*  --   --   --   HEPARINUNFRC  --   --   --   --   --  0.12*  CREATININE  --   --   --  1.04  --   --     Estimated Creatinine Clearance: 50.2 mL/min (by C-G formula based on SCr of 1.04 mg/dL).  Assessment: 85 year old male on warfarin prior to admission for Afib, now s/p hip surgery. Patient was reversed with vitamin K 10mg  IV x 1 on 10/22, and heparin and warfarin started post-op on 10/24.  -heparin level= 0.12   PTA warfarin dose:  2.5mg  daily (last dose 10/21 PTA)  Goal of Therapy:  Heparin level 0.3-0.7 units/ml  INR 2-3 Monitor platelets by anticoagulation protocol: Yes   Plan:  Increase heparin to 1200 units/hr Monitor daily heparin level/INR/CBC  Hildred Laser, PharmD Clinical Pharmacist **Pharmacist phone directory can now be found on [email or web address] ([email or web address]).  Listed under [place].

## 2019-09-15 NOTE — Progress Notes (Signed)
PROGRESS NOTE    [name] [name]  [name]:[id number] DOB: 02/23/1932 DOA: 09/12/2019 PCP: [name] [name]    Brief Narrative:   85 year old male with prior h/o parkinson's disease, MVR , h/o endocarditis, MV angioplasty in 2008. PAF on coumadin, TIA, hyperlipidemia, presents after a fall and was found to have acute displaced left intertrochanteric fracture.  Orthopedics was consulted and he is scheduled for surgical fixation on 10/23. Meanwhile in view of his multiple cardiac issues, cardiology consulted for pre op clearance. He underwent surgical fixation and is currently working wih PT/OT.     Assessment & Plan:   Active Problems:   Femur fracture, left (HCC)   Acute left femur fracture: underwent surgical fixation and working with PT and OT today, recommending CIR.  CIR consult placed. Pain well controlled.      Parkinson's disease:  Resume home carbidopa and levodopa, amantadine and Selegiline.   Dementia:  Continue with aricept.  No agitation   Hyperlipidemia:  Continue with lipitor.    H/o MVR and paroxysmal atrial fibrillation:  Rate controlled with metoprolol and on coumadin for anti coagulation, which has been held for the procedure.  Patient on on IV heparin as he remains in atrial fibrillation.   Chronic systolic heart failure with severe MR:  Echocardiogram earlier this month shows LVEF of 45% to 50% with diffuse hypokinesis.  On low dose lasix which has been held.  TEE scheduled this admission, patient's wife reluctant to get TEE this admission.  Chronic mild thrombocytopenia.  Chronic and mild without any signs of bleeding.   DVT prophylaxis: Heparin Code Status: Full code Family Communication: Discussed with wife at bedside Disposition Plan: Pending CIR  Consultants:   Cardiology  Orthopedics  Procedures: OR on 09/14/2019 Antimicrobials: 1 dose of Ancef perioperatively  Subjective: No complaints.  No chest pain, shortness of breath, nausea or  vomiting  Objective: Vitals:   09/14/19 2024 09/15/19 0457 09/15/19 0800 09/15/19 1333  BP:  97/78 103/78 101/75  Pulse: (!) 109 99 88 99  Resp:   18 18  Temp:  97.9 F (36.6 C) 97.7 F (36.5 C) (!) 97.5 F (36.4 C)  TempSrc:  Oral Oral Oral  SpO2: 97% 98% 93% 98%  Weight:      Height:        Intake/Output Summary (Last 24 hours) at 09/15/2019 1717 Last data filed at 09/15/2019 1356 Gross per 24 hour  Intake 820 ml  Output 800 ml  Net 20 ml   Filed Weights   09/14/19 1010  Weight: 77 kg    Examination:  General exam: Appears calm and comfortable, no distress noted. Respiratory system: Diminished air entry at bases no wheezing or rhonchi Cardiovascular system: S1-S2 heard, irregular, no JVD Gastrointestinal system: Abdomen is soft, nontender, nondistended, bowel sounds are good Central nervous system: Alert and able to answer simple questions Extremities: Painful movements of the left lower extremity Skin: No rashes seen Psychiatry: Mood is appropriate.     Data Reviewed: I have personally reviewed following labs and imaging studies  CBC: Recent Labs  Lab 09/10/19 1042 09/12/19 1925 09/14/19 1432 09/15/19 0405  WBC 4.8 7.8 7.4 5.9  HGB 15.0 14.8 15.4 14.5  HCT 44.9 45.6 48.4 43.3  MCV 95 98.5 99.0 97.5  PLT 120* 108* 108* 103*   Basic Metabolic Panel: Recent Labs  Lab 09/10/19 1042 09/12/19 1925 09/14/19 1432  NA 143 139 142  K 4.7 4.0 3.5  CL 102 103 106  CO2 27  27 26  GLUCOSE 104* 139* 123*  BUN 26 26* 23  CREATININE 1.08 1.20 1.04  CALCIUM 9.0 9.0 8.2*   GFR: Estimated Creatinine Clearance: 50.2 mL/min (by C-G formula based on SCr of 1.04 mg/dL). Liver Function Tests: No results for input(s): AST, ALT, ALKPHOS, BILITOT, PROT, ALBUMIN in the last 168 hours. No results for input(s): LIPASE, AMYLASE in the last 168 hours. No results for input(s): AMMONIA in the last 168 hours. Coagulation Profile: Recent Labs  Lab 09/10/19 0959  09/12/19 1925 09/13/19 0520 09/13/19 1954 09/14/19 0245  INR 3.2* 3.4* 3.8* 1.9* 1.5*   Cardiac Enzymes: No results for input(s): CKTOTAL, CKMB, CKMBINDEX, TROPONINI in the last 168 hours. BNP (last 3 results) No results for input(s): PROBNP in the last 8760 hours. HbA1C: No results for input(s): HGBA1C in the last 72 hours. CBG: No results for input(s): GLUCAP in the last 168 hours. Lipid Profile: No results for input(s): CHOL, HDL, LDLCALC, TRIG, CHOLHDL, LDLDIRECT in the last 72 hours. Thyroid Function Tests: No results for input(s): TSH, T4TOTAL, FREET4, T3FREE, THYROIDAB in the last 72 hours. Anemia Panel: No results for input(s): VITAMINB12, FOLATE, FERRITIN, TIBC, IRON, RETICCTPCT in the last 72 hours. Sepsis Labs: No results for input(s): PROCALCITON, LATICACIDVEN in the last 168 hours.  Recent Results (from the past 240 hour(s))  SARS CORONAVIRUS 2 (TAT 6-24 HRS) Nasopharyngeal Nasopharyngeal Swab     Status: None   Collection Time: 09/12/19  8:44 PM   Specimen: Nasopharyngeal Swab  Result Value Ref Range Status   SARS Coronavirus 2 NEGATIVE NEGATIVE Final    Comment: (NOTE) SARS-CoV-2 target nucleic acids are NOT DETECTED. The SARS-CoV-2 RNA is generally detectable in upper and lower respiratory specimens during the acute phase of infection. Negative results do not preclude SARS-CoV-2 infection, do not rule out co-infections with other pathogens, and should not be used as the sole basis for treatment or other patient management decisions. Negative results must be combined with clinical observations, patient history, and epidemiological information. The expected result is Negative. Fact Sheet for Patients: SugarRoll.be Fact Sheet for Healthcare Providers: https://www.woods-mathews.com/ This test is not yet approved or cleared by the [place] FDA and  has been authorized for detection and/or diagnosis of SARS-CoV-2  by FDA under an Emergency Use Authorization (EUA). This EUA will remain  in effect (meaning this test can be used) for the duration of the COVID-19 declaration under Section 56 4(b)(1) of the Act, 21 U.S.C. section 360bbb-3(b)(1), unless the authorization is terminated or revoked sooner. Performed at [facility], [place] [address] [place], [place] [address]   MRSA PCR Screening     Status: None   Collection Time: 09/14/19 12:55 AM   Specimen: Nasal Mucosa; Nasopharyngeal  Result Value Ref Range Status   MRSA by PCR NEGATIVE NEGATIVE Final    Comment:        The GeneXpert MRSA Assay (FDA approved for NASAL specimens only), is one component of a comprehensive MRSA colonization surveillance program. It is not intended to diagnose MRSA infection nor to guide or monitor treatment for MRSA infections. Performed at [facility], [place] [address], [place], [place] [address]          Radiology Studies: Dg C-arm 1-60 Min  Result Date: 09/14/2019 CLINICAL DATA:  Left femoral nail placement. EXAM: OPERATIVE left HIP (WITH PELVIS IF PERFORMED) 5 VIEWS TECHNIQUE: Fluoroscopic spot image(s) were submitted for interpretation post-operatively. FLUOROSCOPY TIME:  2 minutes 25 seconds. COMPARISON:  September 12, 2019. FINDINGS: Five intraoperative fluoroscopic images of the left hip demonstrate the patient be status post surgical internal fixation of intertrochanteric fracture of the proximal left femur. IMPRESSION: Fluoroscopic guidance provided during surgical internal fixation of intertrochanteric fracture of proximal left femur. Electronically Signed   By: Lupita Raider M.D.   On: 09/14/2019 12:48   Dg Hip Operative Unilat W Or W/o Pelvis Left  Result Date: 09/14/2019 CLINICAL DATA:  Left femoral nail placement. EXAM: OPERATIVE left HIP (WITH PELVIS IF PERFORMED) 5 VIEWS TECHNIQUE: Fluoroscopic spot image(s) were submitted for interpretation post-operatively. FLUOROSCOPY TIME:  2  minutes 25 seconds. COMPARISON:  September 12, 2019. FINDINGS: Five intraoperative fluoroscopic images of the left hip demonstrate the patient be status post surgical internal fixation of intertrochanteric fracture of the proximal left femur. IMPRESSION: Fluoroscopic guidance provided during surgical internal fixation of intertrochanteric fracture of proximal left femur. Electronically Signed   By: Lupita Raider M.D.   On: 09/14/2019 12:48   Dg Hip Unilat W Or W/o Pelvis 2-3 Views Left  Result Date: 09/14/2019 CLINICAL DATA:  Left femur ORIF. EXAM: DG HIP (WITH OR WITHOUT PELVIS) 2-3V LEFT COMPARISON:  Left hip x-rays from same day and September 12, 2019. FINDINGS: Interval left intertrochanteric femur fracture cephalomedullary nail fixation. Postsurgical changes and subcutaneous emphysema about the left lateral hip. IMPRESSION: 1. Interval left intertrochanteric femur fracture ORIF without acute postoperative complication. Electronically Signed   By: Obie Dredge M.D.   On: 09/14/2019 13:50        Scheduled Meds:  acetaminophen  650 mg Oral Q6H   amantadine  100 mg Oral BID   atorvastatin  20 mg Oral QPM   carbidopa-levodopa  3 tablet Oral 2 times per day   And   carbidopa-levodopa  2 tablet Oral q1800   And   carbidopa-levodopa  1 tablet Oral QHS   donepezil  10 mg Oral QHS   escitalopram  10 mg Oral Daily   metoprolol succinate  12.5 mg Oral Daily   selegiline  5 mg Oral QAC breakfast   And   selegiline  2.5 mg Oral QAC supper   warfarin  2.5 mg Oral ONCE-1800   Warfarin - Pharmacist Dosing Inpatient   Does not apply q1800   Continuous Infusions:  heparin 1,000 Units/hr (09/15/19 1059)   lactated ringers Stopped (09/14/19 1300)     LOS: 2 days        Kathlen Mody, [name] [facility] Pager [phone] If 7PM-7AM, please contact night-coverage [email or web address] Password [email or web address] 09/15/2019, 5:17 PM

## 2019-09-16 DIAGNOSIS — I340 Nonrheumatic mitral (valve) insufficiency: Secondary | ICD-10-CM | POA: Diagnosis not present

## 2019-09-16 DIAGNOSIS — I4819 Other persistent atrial fibrillation: Secondary | ICD-10-CM | POA: Diagnosis not present

## 2019-09-16 DIAGNOSIS — I420 Dilated cardiomyopathy: Secondary | ICD-10-CM | POA: Diagnosis not present

## 2019-09-16 LAB — URINALYSIS, ROUTINE W REFLEX MICROSCOPIC
Bacteria, UA: NONE SEEN
Glucose, UA: NEGATIVE mg/dL
Hgb urine dipstick: NEGATIVE
Ketones, ur: 5 mg/dL — AB
Leukocytes,Ua: NEGATIVE
Nitrite: NEGATIVE
Protein, ur: 30 mg/dL — AB
Specific Gravity, Urine: 1.035 — ABNORMAL HIGH (ref 1.005–1.030)
pH: 5 (ref 5.0–8.0)

## 2019-09-16 LAB — CBC
HCT: 41.3 % (ref 39.0–52.0)
Hemoglobin: 13.6 g/dL (ref 13.0–17.0)
MCH: 32 pg (ref 26.0–34.0)
MCHC: 32.9 g/dL (ref 30.0–36.0)
MCV: 97.2 fL (ref 80.0–100.0)
Platelets: 111 10*3/uL — ABNORMAL LOW (ref 150–400)
RBC: 4.25 MIL/uL (ref 4.22–5.81)
RDW: 16.3 % — ABNORMAL HIGH (ref 11.5–15.5)
WBC: 6.2 10*3/uL (ref 4.0–10.5)
nRBC: 0 % (ref 0.0–0.2)

## 2019-09-16 LAB — HEPARIN LEVEL (UNFRACTIONATED)
Heparin Unfractionated: 0.23 IU/mL — ABNORMAL LOW (ref 0.30–0.70)
Heparin Unfractionated: 0.65 IU/mL (ref 0.30–0.70)

## 2019-09-16 LAB — PROTIME-INR
INR: 1.8 — ABNORMAL HIGH (ref 0.8–1.2)
Prothrombin Time: 20.9 seconds — ABNORMAL HIGH (ref 11.4–15.2)

## 2019-09-16 MED ORDER — WARFARIN SODIUM 2.5 MG PO TABS
2.5000 mg | ORAL_TABLET | Freq: Once | ORAL | Status: AC
  Administered 2019-09-16: 2.5 mg via ORAL
  Filled 2019-09-16: qty 1

## 2019-09-16 NOTE — Progress Notes (Signed)
ANTICOAGULATION CONSULT NOTE - Follow Up Consult  Pharmacy Consult for Heparin Indication: atrial fibrillation  Allergies  Allergen Reactions  . Apixaban Palpitations, Rash and Other (See Comments)    Worsening tardive dyskinesia and rapid heart rate  . Ace Inhibitors Cough  . Demerol [Meperidine] Other (See Comments)    Parkinsons disease  . Poison Ivy Extract Itching, Swelling and Rash    Patient Measurements: Height: 5\' 8"  (172.7 cm) Weight: 169 lb 12.8 oz (77 kg) IBW/kg (Calculated) : 68.4  Vital Signs: Temp: 97.7 F (36.5 C) (10/25 2004) Temp Source: Oral (10/25 2004) BP: 105/59 (10/25 2004) Pulse Rate: 88 (10/25 2004)  Labs: Recent Labs    09/14/19 0245  09/14/19 1432 09/15/19 0405 09/15/19 1842 09/16/19 0521 09/16/19 1458  HGB  --    < > 15.4 14.5  --  13.6  --   HCT  --   --  48.4 43.3  --  41.3  --   PLT  --   --  108* 103*  --  111*  --   LABPROT 17.6*  --   --   --   --  20.9*  --   INR 1.5*  --   --   --   --  1.8*  --   HEPARINUNFRC  --   --   --   --  0.12* 0.23* 0.65  CREATININE  --   --  1.04  --   --   --   --    < > = values in this interval not displayed.    Estimated Creatinine Clearance: 50.2 mL/min (by C-G formula based on SCr of 1.04 mg/dL).  Assessment: 85 year old male on warfarin prior to admission for Afib, now s/p hip surgery. Patient was reversed with vitamin K 10mg  IV x 1 on 10/22, and heparin and warfarin started post-op on 10/24.   Heparin was stopped at 7pm for concern of hematuria. Plans are to hold heparin for now and reassess in am.   Goal of Therapy:  Heparin level 0.3-0.7 units/ml  INR 2-3 Monitor platelets by anticoagulation protocol: Yes   Plan:  -Hold heparin for now -CBC in am   Hildred Laser, PharmD Clinical Pharmacist **Pharmacist phone directory can now be found on [email or web address] (PW [email or web address]).  Listed under [place].

## 2019-09-16 NOTE — Progress Notes (Signed)
ANTICOAGULATION CONSULT NOTE - Follow Up Consult  Pharmacy Consult for Heparin Indication: atrial fibrillation  Allergies  Allergen Reactions  . Apixaban Palpitations, Rash and Other (See Comments)    Worsening tardive dyskinesia and rapid heart rate  . Ace Inhibitors Cough  . Demerol [Meperidine] Other (See Comments)    Parkinsons disease  . Poison Ivy Extract Itching, Swelling and Rash    Patient Measurements: Height: 5\' 8"  (172.7 cm) Weight: 169 lb 12.8 oz (77 kg) IBW/kg (Calculated) : 68.4  Vital Signs: Temp: 98.6 F (37 C) (10/24 2012) Temp Source: Oral (10/24 2012) BP: 133/81 (10/24 2012) Pulse Rate: 91 (10/24 2012)  Labs: Recent Labs    09/13/19 1954 09/14/19 0245  09/14/19 1432 09/15/19 0405 09/15/19 1842 09/16/19 0521  HGB  --   --    < > 15.4 14.5  --  13.6  HCT  --   --   --  48.4 43.3  --  41.3  PLT  --   --   --  108* 103*  --  111*  LABPROT 21.2* 17.6*  --   --   --   --   --   INR 1.9* 1.5*  --   --   --   --   --   HEPARINUNFRC  --   --   --   --   --  0.12* 0.23*  CREATININE  --   --   --  1.04  --   --   --    < > = values in this interval not displayed.    Estimated Creatinine Clearance: 50.2 mL/min (by C-G formula based on SCr of 1.04 mg/dL).  Assessment: 85 year old male on warfarin prior to admission for Afib, now s/p hip surgery. Patient was reversed with vitamin K 10mg  IV x 1 on 10/22, and heparin and warfarin started post-op on 10/24.  -heparin level= 0.12  10/25 AM update: Heparin level low No issues per RN  Goal of Therapy:  Heparin level 0.3-0.7 units/ml  INR 2-3 Monitor platelets by anticoagulation protocol: Yes   Plan:  Increase heparin to 1400 units/hr Re-check heparin level in 8 hours  Narda Bonds, PharmD, [place] Pharmacist Phone: [phone]

## 2019-09-16 NOTE — Progress Notes (Signed)
ANTICOAGULATION CONSULT NOTE - Follow Up Consult  Pharmacy Consult for Heparin Indication: atrial fibrillation  Allergies  Allergen Reactions  . Apixaban Palpitations, Rash and Other (See Comments)    Worsening tardive dyskinesia and rapid heart rate  . Ace Inhibitors Cough  . Demerol [Meperidine] Other (See Comments)    Parkinsons disease  . Poison Ivy Extract Itching, Swelling and Rash    Patient Measurements: Height: 5\' 8"  (172.7 cm) Weight: 169 lb 12.8 oz (77 kg) IBW/kg (Calculated) : 68.4  Vital Signs: Temp: 98.5 F (36.9 C) (10/25 0823) Temp Source: Oral (10/25 0823) BP: 105/80 (10/25 0823) Pulse Rate: 102 (10/25 0823)  Labs: Recent Labs    09/13/19 1954 09/14/19 0245  09/14/19 1432 09/15/19 0405 09/15/19 1842 09/16/19 0521 09/16/19 1458  HGB  --   --    < > 15.4 14.5  --  13.6  --   HCT  --   --   --  48.4 43.3  --  41.3  --   PLT  --   --   --  108* 103*  --  111*  --   LABPROT 21.2* 17.6*  --   --   --   --  20.9*  --   INR 1.9* 1.5*  --   --   --   --  1.8*  --   HEPARINUNFRC  --   --   --   --   --  0.12* 0.23* 0.65  CREATININE  --   --   --  1.04  --   --   --   --    < > = values in this interval not displayed.    Estimated Creatinine Clearance: 50.2 mL/min (by C-G formula based on SCr of 1.04 mg/dL).  Assessment: 85 year old male on warfarin prior to admission for Afib, now s/p hip surgery. Patient was reversed with vitamin K 10mg  IV x 1 on 10/22, and heparin and warfarin started post-op on 10/24.  -heparin level= 0.65   Goal of Therapy:  Heparin level 0.3-0.7 units/ml  INR 2-3 Monitor platelets by anticoagulation protocol: Yes   Plan:  No heparin changes need Recheck heparin level today  Hildred Laser, PharmD Clinical Pharmacist **Pharmacist phone directory can now be found on [email or web address] (PW [email or web address]).  Listed under [place].

## 2019-09-16 NOTE — Progress Notes (Signed)
PROGRESS NOTE    [name] [name]  WUJ:[id number] DOB: 11/24/1931 DOA: 09/12/2019 PCP: Rodrigo RanPerini, Mark, MD    Brief Narrative:   85 year old male with prior h/o parkinson's disease, MVR , h/o endocarditis, MV angioplasty in 2008. PAF on coumadin, TIA, hyperlipidemia, presents after a fall and was found to have acute displaced left intertrochanteric fracture. Cardiology consulted for pre op clearance.  Orthopedics was consulted and he underwent s/p intramedullary nail placement on 09/14/19.     PT/OT evaluation recommending CIR. Consult placed   Assessment & Plan:   Active Problems:   Femur fracture, left (HCC)   Acute left femur fracture: underwent surgical fixation and working with PT and OT, recommending CIR.   CIR consult placed. Orthopedics recommended weight bearing as tolerated with assistance.  Pain well controlled today, but unclear if he is confused. He is alert and answering questions appropriately.      Parkinson's disease:  Resume home carbidopa and levodopa, amantadine and Selegiline.   Dementia:  Continue with aricept.  No agitation   Hyperlipidemia:  Continue with lipitor.    H/o MVR and paroxysmal atrial fibrillation:  Rate controlled with metoprolol and on coumadin for anti coagulation,. Patient on on IV heparin as he remains in atrial fibrillation. INR sub therapeutic.    Chronic systolic heart failure with severe MR:  Echocardiogram earlier this month shows LVEF of 45% to 50% with diffuse hypokinesis.  TEE scheduled this admission next week, if he improves from hip fracture.  Cardiology on board and assisting .  Lasix has been held for hypotension and hip surgery.   Chronic mild thrombocytopenia.  Chronic and mild without any signs of bleeding.   DVT prophylaxis: Heparin Code Status: Full code Family Communication: none at bedside.  Disposition Plan: Pending CIR  Consultants:   Cardiology  Orthopedics  Procedures: OR on 09/14/2019  Antimicrobials: 1 dose of Ancef perioperatively  Subjective: No new complaints. He reports no leg pain.  No chest pain or sob.   Objective: Vitals:   09/15/19 1333 09/15/19 2012 09/16/19 0357 09/16/19 0823  BP: 101/75 133/81 113/83 105/80  Pulse: 99 91 (!) 54 (!) 102  Resp: 18   16  Temp: (!) 97.5 F (36.4 C) 98.6 F (37 C) 98 F (36.7 C) 98.5 F (36.9 C)  TempSrc: Oral Oral Oral Oral  SpO2: 98% 97% 100% 91%  Weight:      Height:        Intake/Output Summary (Last 24 hours) at 09/16/2019 1123 Last data filed at 09/15/2019 1356 Gross per 24 hour  Intake 240 ml  Output 400 ml  Net -160 ml   Filed Weights   09/14/19 1010  Weight: 77 kg    Examination:  General exam: calm and comfortable.  Respiratory system:air entry fair, no wheezing or rhonchi.  Cardiovascular system: s1s2, irregular, no JVD. No pedal edema.  Gastrointestinal system: abd is soft, non tender non distended, bowel sounds good.  Central nervous system: alert and answering questions appropriately. Extremities: no pedal edema. Skin: no rashes seen.  Psychiatry: no agitation, mood is appropriate.    Data Reviewed: I have personally reviewed following labs and imaging studies  CBC: Recent Labs  Lab 09/10/19 1042 09/12/19 1925 09/14/19 1432 09/15/19 0405 09/16/19 0521  WBC 4.8 7.8 7.4 5.9 6.2  HGB 15.0 14.8 15.4 14.5 13.6  HCT 44.9 45.6 48.4 43.3 41.3  MCV 95 98.5 99.0 97.5 97.2  PLT 120* 108* 108* 103* 111*   Basic Metabolic Panel: Recent  Labs  Lab 09/10/19 1042 09/12/19 1925 09/14/19 1432  NA 143 139 142  K 4.7 4.0 3.5  CL 102 103 106  CO2 27 27 26   GLUCOSE 104* 139* 123*  BUN 26 26* 23  CREATININE 1.08 1.20 1.04  CALCIUM 9.0 9.0 8.2*   GFR: Estimated Creatinine Clearance: 50.2 mL/min (by C-G formula based on SCr of 1.04 mg/dL). Liver Function Tests: No results for input(s): AST, ALT, ALKPHOS, BILITOT, PROT, ALBUMIN in the last 168 hours. No results for input(s): LIPASE,  AMYLASE in the last 168 hours. No results for input(s): AMMONIA in the last 168 hours. Coagulation Profile: Recent Labs  Lab 09/12/19 1925 09/13/19 0520 09/13/19 1954 09/14/19 0245 09/16/19 0521  INR 3.4* 3.8* 1.9* 1.5* 1.8*   Cardiac Enzymes: No results for input(s): CKTOTAL, CKMB, CKMBINDEX, TROPONINI in the last 168 hours. BNP (last 3 results) No results for input(s): PROBNP in the last 8760 hours. HbA1C: No results for input(s): HGBA1C in the last 72 hours. CBG: No results for input(s): GLUCAP in the last 168 hours. Lipid Profile: No results for input(s): CHOL, HDL, LDLCALC, TRIG, CHOLHDL, LDLDIRECT in the last 72 hours. Thyroid Function Tests: No results for input(s): TSH, T4TOTAL, FREET4, T3FREE, THYROIDAB in the last 72 hours. Anemia Panel: No results for input(s): VITAMINB12, FOLATE, FERRITIN, TIBC, IRON, RETICCTPCT in the last 72 hours. Sepsis Labs: No results for input(s): PROCALCITON, LATICACIDVEN in the last 168 hours.  Recent Results (from the past 240 hour(s))  SARS CORONAVIRUS 2 (TAT 6-24 HRS) Nasopharyngeal Nasopharyngeal Swab     Status: None   Collection Time: 09/12/19  8:44 PM   Specimen: Nasopharyngeal Swab  Result Value Ref Range Status   SARS Coronavirus 2 NEGATIVE NEGATIVE Final    Comment: (NOTE) SARS-CoV-2 target nucleic acids are NOT DETECTED. The SARS-CoV-2 RNA is generally detectable in upper and lower respiratory specimens during the acute phase of infection. Negative results do not preclude SARS-CoV-2 infection, do not rule out co-infections with other pathogens, and should not be used as the sole basis for treatment or other patient management decisions. Negative results must be combined with clinical observations, patient history, and epidemiological information. The expected result is Negative. Fact Sheet for Patients: 09/14/19 Fact Sheet for Healthcare Providers:  HairSlick.no This test is not yet approved or cleared by the quierodirigir.com FDA and  has been authorized for detection and/or diagnosis of SARS-CoV-2 by FDA under an Emergency Use Authorization (EUA). This EUA will remain  in effect (meaning this test can be used) for the duration of the COVID-19 declaration under Section 56 4(b)(1) of the Act, 21 U.S.C. section 360bbb-3(b)(1), unless the authorization is terminated or revoked sooner. Performed at [facility], [address] [place], [place]   MRSA PCR Screening     Status: None   Collection Time: 09/14/19 12:55 AM   Specimen: Nasal Mucosa; Nasopharyngeal  Result Value Ref Range Status   MRSA by PCR NEGATIVE NEGATIVE Final    Comment:        The GeneXpert MRSA Assay (FDA approved for NASAL specimens only), is one component of a comprehensive MRSA colonization surveillance program. It is not intended to diagnose MRSA infection nor to guide or monitor treatment for MRSA infections. Performed at [facility], [address] [place], [place]          Radiology Studies: Dg C-arm 1-60 Min  Result Date: 09/14/2019 CLINICAL DATA:  Left femoral nail placement. EXAM: OPERATIVE left  HIP (WITH PELVIS IF PERFORMED) 5 VIEWS TECHNIQUE: Fluoroscopic spot image(s) were submitted for interpretation post-operatively. FLUOROSCOPY TIME:  2 minutes 25 seconds. COMPARISON:  September 12, 2019. FINDINGS: Five intraoperative fluoroscopic images of the left hip demonstrate the patient be status post surgical internal fixation of intertrochanteric fracture of the proximal left femur. IMPRESSION: Fluoroscopic guidance provided during surgical internal fixation of intertrochanteric fracture of proximal left femur. Electronically Signed   By: Marijo Conception M.D.   On: 09/14/2019 12:48   Dg Hip Operative Unilat W Or W/o Pelvis Left  Result Date: 09/14/2019 CLINICAL DATA:  Left femoral nail  placement. EXAM: OPERATIVE left HIP (WITH PELVIS IF PERFORMED) 5 VIEWS TECHNIQUE: Fluoroscopic spot image(s) were submitted for interpretation post-operatively. FLUOROSCOPY TIME:  2 minutes 25 seconds. COMPARISON:  September 12, 2019. FINDINGS: Five intraoperative fluoroscopic images of the left hip demonstrate the patient be status post surgical internal fixation of intertrochanteric fracture of the proximal left femur. IMPRESSION: Fluoroscopic guidance provided during surgical internal fixation of intertrochanteric fracture of proximal left femur. Electronically Signed   By: Marijo Conception M.D.   On: 09/14/2019 12:48   Dg Hip Unilat W Or W/o Pelvis 2-3 Views Left  Result Date: 09/14/2019 CLINICAL DATA:  Left femur ORIF. EXAM: DG HIP (WITH OR WITHOUT PELVIS) 2-3V LEFT COMPARISON:  Left hip x-rays from same day and September 12, 2019. FINDINGS: Interval left intertrochanteric femur fracture cephalomedullary nail fixation. Postsurgical changes and subcutaneous emphysema about the left lateral hip. IMPRESSION: 1. Interval left intertrochanteric femur fracture ORIF without acute postoperative complication. Electronically Signed   By: Titus Dubin M.D.   On: 09/14/2019 13:50        Scheduled Meds: . acetaminophen  650 mg Oral Q6H  . amantadine  100 mg Oral BID  . atorvastatin  20 mg Oral QPM  . carbidopa-levodopa  3 tablet Oral 2 times per day   And  . carbidopa-levodopa  2 tablet Oral q1800   And  . carbidopa-levodopa  1 tablet Oral QHS  . donepezil  10 mg Oral QHS  . escitalopram  10 mg Oral Daily  . metoprolol succinate  12.5 mg Oral Daily  . selegiline  5 mg Oral QAC breakfast   And  . selegiline  2.5 mg Oral QAC supper  . warfarin  2.5 mg Oral ONCE-1800  . Warfarin - Pharmacist Dosing Inpatient   Does not apply q1800   Continuous Infusions: . heparin 1,400 Units/hr (09/16/19 0955)  . lactated ringers Stopped (09/14/19 1300)     LOS: 3 days        Hosie Poisson, MD [facility] Pager [phone] If 7PM-7AM, please contact night-coverage [email or web address] Password [email or web address] 09/16/2019, 11:23 AM

## 2019-09-16 NOTE — Progress Notes (Signed)
Orthopaedic Trauma Service Progress Note  Patient ID: [name] HensenJohn Hussar MRN: [id number] DOB/AGE: 83/03/1932 83 y.o.  Subjective:  No acute issues Wife at bedside  Hopeful for inpatient rehab   ROS As above Objective:   VITALS:   Vitals:   09/15/19 1333 09/15/19 2012 09/16/19 0357 09/16/19 0823  BP: 101/75 133/81 113/83 105/80  Pulse: 99 91 (!) 54 (!) 102  Resp: 18   16  Temp: (!) 97.5 F (36.4 C) 98.6 F (37 C) 98 F (36.7 C) 98.5 F (36.9 C)  TempSrc: Oral Oral Oral Oral  SpO2: 98% 97% 100% 91%  Weight:      Height:        Estimated body mass index is 25.82 kg/m as calculated from the following:   Height as of this encounter: 5\' 8"  (1.727 m).   Weight as of this encounter: 77 kg.   Intake/Output      10/24 0701 - 10/25 0700 10/25 0701 - 10/26 0700   P.O. 480    I.V. (mL/kg)     Other     IV Piggyback     Total Intake(mL/kg) 480 (6.2)    Urine (mL/kg/hr) 800 (0.4)    Blood     Total Output 800    Net -320         Urine Occurrence 2 x    Stool Occurrence 1 x      LABS  Results for orders placed or performed during the hospital encounter of 09/12/19 (from the past 24 hour(s))  Heparin level (unfractionated)     Status: Abnormal   Collection Time: 09/15/19  6:42 PM  Result Value Ref Range   Heparin Unfractionated 0.12 (L) 0.30 - 0.70 IU/mL  Heparin level (unfractionated)     Status: Abnormal   Collection Time: 09/16/19  5:21 AM  Result Value Ref Range   Heparin Unfractionated 0.23 (L) 0.30 - 0.70 IU/mL  CBC     Status: Abnormal   Collection Time: 09/16/19  5:21 AM  Result Value Ref Range   WBC 6.2 4.0 - 10.5 K/uL   RBC 4.25 4.22 - 5.81 MIL/uL   Hemoglobin 13.6 13.0 - 17.0 g/dL   HCT 40.941.3 81.139.0 - 91.452.0 %   MCV 97.2 80.0 - 100.0 fL   MCH 32.0 26.0 - 34.0 pg   MCHC 32.9 30.0 - 36.0 g/dL   RDW 78.216.3 (H) 95.611.5 - 21.315.5 %   Platelets 111 (L) 150 - 400 K/uL   nRBC 0.0 0.0 - 0.2 %   Protime-INR     Status: Abnormal   Collection Time: 09/16/19  5:21 AM  Result Value Ref Range   Prothrombin Time 20.9 (H) 11.4 - 15.2 seconds   INR 1.8 (H) 0.8 - 1.2     PHYSICAL EXAM:   Gen: Resting comfortably in bed, no acute distress, very pleasant Lungs: Clear anterior fields Cardiac: Irregularly irregular Abd:NT + BS Ext:       Left lower extremity             Dressing with scant drainage but is otherwise intact   Incisions look great   Moderate ecchymosis left hip             No significant swelling distally             Knee  is nontender             Extremity is warm             No deep calf tenderness             Compartments are soft and nontender             DPN, SPN, TN sensory functions are grossly intact             EHL, FHL, lesser toe motor functions are grossly intact.   Assessment/Plan: 2 Days Post-Op   Active Problems:   Femur fracture, left ([facility])   Anti-infectives (From admission, onward)   Start     Dose/Rate Route Frequency Ordered Stop   09/14/19 1930  ceFAZolin (ANCEF) IVPB 2g/100 mL premix     2 g 200 mL/hr over 30 Minutes Intravenous Every 8 hours 09/14/19 1414 09/15/19 1533   09/14/19 1210  vancomycin (VANCOCIN) powder  Status:  Discontinued       As needed 09/14/19 1145 09/14/19 1255   09/14/19 0600  ceFAZolin (ANCEF) IVPB 2g/100 mL premix     2 g 200 mL/hr over 30 Minutes Intravenous To Short Stay 09/14/19 0454 09/14/19 1120    .  POD/HD#: 1  85 year old male with history of A. fib on chronic anticoagulation along with history of mitral valve replacement and Parkinson's disease s/p fall with left hip fracture   -Fall   -Left intertrochanteric femur fracture s/p IMN              Weight-bear as tolerated with assistance             Range of motion as tolerated left hip and knee             Dressing changes as needed    Ok to clean with soap and water             PT and OT             Ice as needed               CIR consult   -  Pain management:             Continue with scheduled Tylenol 650 mg every 6 hours             Norco 5/325 1 every 6 hours as needed for severe breakthrough pain                         Current regimen as written does not exceed 4000 mg of Tylenol in a 24-hour period   - ABL anemia/Hemodynamics          Stable   - Medical issues              Per primary and cardiology   - DVT/PE prophylaxis:             Heparin bridge to Coumadin.  INR is climbing   - ID:              Perioperative antibiotics completed   - Metabolic Bone Disease:             Vitamin D levels look great                         Continue with current supplementation  However fracture is suggestive of poor bone quality in and of itself                         Patient does have several risk factors for increase incidence of osteoporosis being Parkinson's disease which in and of itself is associated with osteoporosis due to impaired mobility.                         Patient is also on Lexapro which is also associated with diminished bone density                         Lasix use is also associated with poor bone density as well due to calcium excretion               Would recommend bone density scan in the outpatient setting in 4 to 8 weeks and treating accordingly.   - Activity:             Weight-bear as tolerated with assistance     - Impediments to fracture healing:             Low-energy fall             Presumed osteoporosis/osteopenia             Parkinson's disease which increases fall risk in and of itself   - Dispo:             Continue with therapies             Ortho issues are stable             Inpatient rehabilitation consult per therapy recommendations     Mearl Latin, PA-C [phone] (C) 09/16/2019, 11:00 AM  [facility] [address] [place] [address] [phone] [name] (F)

## 2019-09-16 NOTE — Progress Notes (Signed)
Progress Note  Patient Name: [name] [name] Date of Encounter: 09/16/2019  Primary Cardiologist: Chrystie Nose, MD   Subjective   Very confused but oriented to person and place.  Denies any chest pain or SOB.  Remains in afib with HR in the 90's  Inpatient Medications    Scheduled Meds: . acetaminophen  650 mg Oral Q6H  . amantadine  100 mg Oral BID  . atorvastatin  20 mg Oral QPM  . carbidopa-levodopa  3 tablet Oral 2 times per day   And  . carbidopa-levodopa  2 tablet Oral q1800   And  . carbidopa-levodopa  1 tablet Oral QHS  . donepezil  10 mg Oral QHS  . escitalopram  10 mg Oral Daily  . metoprolol succinate  12.5 mg Oral Daily  . selegiline  5 mg Oral QAC breakfast   And  . selegiline  2.5 mg Oral QAC supper  . warfarin  2.5 mg Oral ONCE-1800  . Warfarin - Pharmacist Dosing Inpatient   Does not apply q1800   Continuous Infusions: . heparin 1,400 Units/hr (09/16/19 0648)  . lactated ringers Stopped (09/14/19 1300)   PRN Meds: bisacodyl, HYDROcodone-acetaminophen, methocarbamol, morphine injection   Vital Signs    Vitals:   09/15/19 1333 09/15/19 2012 09/16/19 0357 09/16/19 0823  BP: 101/75 133/81 113/83 105/80  Pulse: 99 91 (!) 54 (!) 102  Resp: 18   16  Temp: (!) 97.5 F (36.4 C) 98.6 F (37 C) 98 F (36.7 C) 98.5 F (36.9 C)  TempSrc: Oral Oral Oral Oral  SpO2: 98% 97% 100% 91%  Weight:      Height:        Intake/Output Summary (Last 24 hours) at 09/16/2019 0951 Last data filed at 09/15/2019 1356 Gross per 24 hour  Intake 240 ml  Output 400 ml  Net -160 ml   Last 3 Weights 09/14/2019 09/05/2019 08/24/2019  Weight (lbs) 169 lb 12.8 oz 169 lb 12.8 oz 177 lb 9.6 oz  Weight (kg) 77.021 kg 77.021 kg 80.559 kg      Telemetry    Atrial fibrillation with HR 90's - Personally Reviewed  ECG    No new EKG to review  Physical Exam   GEN: Well nourished, well developed in no acute distress HEENT: Normal NECK: No JVD; No carotid bruits  LYMPHATICS: No lymphadenopathy CARDIAC:irregularly irregular, no  rubs, gallops.  2/6 systolic murmur at LLSB to apex RESPIRATORY:  Clear to auscultation without rales, wheezing or rhonchi  ABDOMEN: Soft, non-tender, non-distended MUSCULOSKELETAL:  No edema; No deformity  SKIN: Warm and dry NEUROLOGIC:  Alert and oriented x 3 PSYCHIATRIC:  Normal affect    Labs    High Sensitivity Troponin:  No results for input(s): TROPONINIHS in the last 720 hours.    Chemistry Recent Labs  Lab 09/10/19 1042 09/12/19 1925 09/14/19 1432  NA 143 139 142  K 4.7 4.0 3.5  CL 102 103 106  CO2 27 27 26   GLUCOSE 104* 139* 123*  BUN 26 26* 23  CREATININE 1.08 1.20 1.04  CALCIUM 9.0 9.0 8.2*  GFRNONAA 62 55* >60  GFRAA 72 >60 >60  ANIONGAP  --  9 10     Hematology Recent Labs  Lab 09/14/19 1432 09/15/19 0405 09/16/19 0521  WBC 7.4 5.9 6.2  RBC 4.89 4.44 4.25  HGB 15.4 14.5 13.6  HCT 48.4 43.3 41.3  MCV 99.0 97.5 97.2  MCH 31.5 32.7 32.0  MCHC 31.8 33.5 32.9  RDW 16.6*  16.5* 16.3*  PLT 108* 103* 111*    BNPNo results for input(s): BNP, PROBNP in the last 168 hours.   DDimer No results for input(s): DDIMER in the last 168 hours.   Radiology    Dg C-arm 1-60 Min  Result Date: 09/14/2019 CLINICAL DATA:  Left femoral nail placement. EXAM: OPERATIVE left HIP (WITH PELVIS IF PERFORMED) 5 VIEWS TECHNIQUE: Fluoroscopic spot image(s) were submitted for interpretation post-operatively. FLUOROSCOPY TIME:  2 minutes 25 seconds. COMPARISON:  September 12, 2019. FINDINGS: Five intraoperative fluoroscopic images of the left hip demonstrate the patient be status post surgical internal fixation of intertrochanteric fracture of the proximal left femur. IMPRESSION: Fluoroscopic guidance provided during surgical internal fixation of intertrochanteric fracture of proximal left femur. Electronically Signed   By: Marijo Conception M.D.   On: 09/14/2019 12:48   Dg Hip Operative Unilat W Or W/o Pelvis Left   Result Date: 09/14/2019 CLINICAL DATA:  Left femoral nail placement. EXAM: OPERATIVE left HIP (WITH PELVIS IF PERFORMED) 5 VIEWS TECHNIQUE: Fluoroscopic spot image(s) were submitted for interpretation post-operatively. FLUOROSCOPY TIME:  2 minutes 25 seconds. COMPARISON:  September 12, 2019. FINDINGS: Five intraoperative fluoroscopic images of the left hip demonstrate the patient be status post surgical internal fixation of intertrochanteric fracture of the proximal left femur. IMPRESSION: Fluoroscopic guidance provided during surgical internal fixation of intertrochanteric fracture of proximal left femur. Electronically Signed   By: Marijo Conception M.D.   On: 09/14/2019 12:48   Dg Hip Unilat W Or W/o Pelvis 2-3 Views Left  Result Date: 09/14/2019 CLINICAL DATA:  Left femur ORIF. EXAM: DG HIP (WITH OR WITHOUT PELVIS) 2-3V LEFT COMPARISON:  Left hip x-rays from same day and September 12, 2019. FINDINGS: Interval left intertrochanteric femur fracture cephalomedullary nail fixation. Postsurgical changes and subcutaneous emphysema about the left lateral hip. IMPRESSION: 1. Interval left intertrochanteric femur fracture ORIF without acute postoperative complication. Electronically Signed   By: Titus Dubin M.D.   On: 09/14/2019 13:50    Cardiac Studies   Echo 08/2019 1. Left ventricular ejection fraction, by visual estimation, is 45 to 50%. The left ventricle has mildly decreased function. Mildly increased left ventricular size. There is no left ventricular hypertrophy. Mild diffuse hypokinesis. 2. Left ventricular diastolic Doppler parameters are indeterminate pattern of LV diastolic filling. 3. Global right ventricle has moderately reduced systolic function.The right ventricular size is mildly enlarged. No increase in right ventricular wall thickness. 4. The patient is status post mitral valve repair. There appears to be severe mitral regurgitation, eccentric and posteriorly-directed. The mean  gradient was 7 mmHg but MVA was normal by PHT, I think that the elevated mean gradient is due to high flow  from mitral regurgitation rather than significant mitral stenosis. 5. The aortic valve is tricuspid Aortic valve regurgitation is mild by color flow Doppler. Mild aortic valve sclerosis without stenosis. 6. There is mild dilatation of the aortic root measuring 39 mm. 7. The tricuspid valve is normal in structure. Tricuspid valve regurgitation moderate. 8. Left atrial size was severely dilated. 9. Right atrial size was severely dilated. 10. The IVC was not visualized. Peak RV-RA gradient 28 mmHg.   Patient Profile   [name] [name] is a 85 y.o. male with a hx of endocarditis s/p mitral valve repair in 2008, cryptogenic stroke with subsequent discover of paroxysmal Afib by implantable loop recorder, hyperlipidemia, and Parkinson's disease who is being seen for the evaluation of pre-operative evaluation for left intertrochanteric fx repair at the request of Dr.  Akula.  Assessment & Plan    1.  Hip fx -s/p repair POD #2  2. PAF   -had recurrent afib post op -remains in afib in the 90's -continue IV heparin gtt while loading coumadin -continue BB for rate control -BP too soft to uptitrate BB for better rate control -INR 1.8 -likely will not maintain NSR given his severe MR  3.  S/p MV repair with severe MR by recent echo -Plan for eval with TEE after he recovers from hip surgery -Pt set for next week but probably postpont    4.  Mild LV dysfunction -EF 45-50% on echo earlier this month -appears euvolemic on exam   For questions or updates, please contact [facility] Please consult [email or web address] for contact info under        Signed, Armanda Magicraci Shaliah Wann, MD  09/16/2019, 9:51 AM

## 2019-09-16 NOTE — Progress Notes (Signed)
Notified Dr. Kennon Holter that pt's urine is dark amber with no visible signs of blood in the urine. Urine for UA sent. Lab has drawn the heparin level and waiting for results. Awaiting orders to resume heparin.

## 2019-09-16 NOTE — Progress Notes (Signed)
Dr. Karleen Hampshire notified of blood in urine from condom cath at 1900 on 09/16/19. MD states to hold Heparin for two hours and to contact night shift hospitalist at 2100 on 09/16/19 to determine plan based on appearance of urine. Order also received to obtain urinalysis via I&O cath. Pharmacy notified of order to hold Herparin.

## 2019-09-16 NOTE — Progress Notes (Signed)
Physical Therapy Treatment Patient Details Name: [name] [name] MRN: [id number] DOB: [birth year] Today's Date: 09/16/2019    History of Present Illness Pt is a 85 yr old male who had a fall and s/p Cephalomedullary nailing of left intertrochanteric femur fracture. PMH but not limited to: dyslipidemia, parkinson's s/p mitral valve replacement, stroke     PT Comments    Today's skilled session continued to focus on activity progression with emphasis on bed mobility and transfers. Progressed to out of bed in chair today. Pt continues to need increased assistance with mobility compared to assist need before injury. Remains appropriate for CIR. Acute PT to continue during pt's hospital stay.     Follow Up Recommendations  CIR;Supervision/Assistance - 24 hour     Equipment Recommendations  Wheelchair (measurements PT)    Recommendations for Other Services Rehab consult     Precautions / Restrictions Precautions Precautions: Fall Restrictions LLE Weight Bearing: Weight bearing as tolerated    Mobility  Bed Mobility Overal bed mobility: Needs Assistance Bed Mobility: Supine to Sit     Supine to sit: Mod assist     General bed mobility comments: cues/assist to bend both knees and to roll onto right side. cues/assist to have pt grab rail with bil hands. once on side pt able to assist with bringing legs off edge of bed and used arms to assist with elevating trunk into sitting position.  Transfers Overall transfer level: Needs assistance Equipment used: Rolling walker (2 wheeled) Transfers: Sit to/from [facility] Sit to Stand: Mod assist;From elevated surface Stand pivot transfers: Min assist;Mod assist       General transfer comment: elevated bed so that hips were just above pt's knees. assist with cues/faciliation for weight shifting and to power up through LE's. pt needed cues for hand placement with transfer as well. once standing to RW pt was able to take 3-4  pivot step toward recliner with min assist. cues for posture and sequencing. mod assist with backwards stepping to chair and for controlled descent with sitting to chair.      Cognition Arousal/Alertness: Awake/alert Behavior During Therapy: WFL for tasks assessed/performed Overall Cognitive Status: History of cognitive impairments - at baseline                 General Comments: Pt able to recall previous therapists names at [facility], his ex program at home prior to fall. Pt oriented x4 this session.      Exercises General Exercises - Lower Extremity Ankle Circles/Pumps: AROM;Both;10 reps;Supine Heel Slides: AAROM;Strengthening;10 reps;Supine;Left     Pertinent Vitals/Pain Pain Assessment: 0-10 Pain Score: 3 (3/10 at rest, 8/10 with mobility) Pain Location: LLE with movement Pain Descriptors / Indicators: Aching;Grimacing;Guarding Pain Intervention(s): Limited activity within patient's tolerance;Monitored during session;Other (comment)(pt declining pain medication at this time "it will ease off")     PT Goals (current goals can now be found in the care plan section) Acute Rehab PT Goals Patient Stated Goal: to get stronger Time For Goal Achievement: 09/29/19 Potential to Achieve Goals: Good Progress towards PT goals: Progressing toward goals    Frequency    Min 5X/week      PT Plan Current plan remains appropriate    AM-PAC PT "6 Clicks" Mobility   Outcome Measure  Help needed turning from your back to your side while in a flat bed without using bedrails?: A Lot Help needed moving from lying on your back to sitting on the side of a flat bed without using bedrails?:  A Lot Help needed moving to and from a bed to a chair (including a wheelchair)?: A Little Help needed standing up from a chair using your arms (e.g., wheelchair or bedside chair)?: A Lot Help needed to walk in hospital room?: A Lot Help needed climbing 3-5 steps with a railing? : Total 6 Click  Score: 12    End of Session Equipment Utilized During Treatment: Gait belt Activity Tolerance: Patient tolerated treatment well;Patient limited by pain Patient left: in chair;with call bell/phone within reach;with chair alarm set Nurse Communication: Mobility status PT Visit Diagnosis: Pain;Difficulty in walking, not elsewhere classified (R26.2);Unsteadiness on feet (R26.81) Pain - Right/Left: Left Pain - part of body: Hip     Time: 2376-2831 PT Time Calculation (min) (ACUTE ONLY): 18 min  Charges:  $Therapeutic Activity: 8-22 mins                    Sallyanne Kuster, PTA, CLT Acute Rehab Services Office- [phone] 09/16/19, 1:48 PM  Sallyanne Kuster 09/16/2019, 1:46 PM

## 2019-09-17 ENCOUNTER — Ambulatory Visit: Payer: Medicare Other | Admitting: Adult Health

## 2019-09-17 ENCOUNTER — Encounter (HOSPITAL_COMMUNITY): Payer: Self-pay | Admitting: Student

## 2019-09-17 DIAGNOSIS — T148XXA Other injury of unspecified body region, initial encounter: Secondary | ICD-10-CM

## 2019-09-17 LAB — CBC
HCT: 39.4 % (ref 39.0–52.0)
Hemoglobin: 13.1 g/dL (ref 13.0–17.0)
MCH: 32 pg (ref 26.0–34.0)
MCHC: 33.2 g/dL (ref 30.0–36.0)
MCV: 96.3 fL (ref 80.0–100.0)
Platelets: 114 10*3/uL — ABNORMAL LOW (ref 150–400)
RBC: 4.09 MIL/uL — ABNORMAL LOW (ref 4.22–5.81)
RDW: 16.2 % — ABNORMAL HIGH (ref 11.5–15.5)
WBC: 5.4 10*3/uL (ref 4.0–10.5)
nRBC: 0 % (ref 0.0–0.2)

## 2019-09-17 LAB — BASIC METABOLIC PANEL
Anion gap: 11 (ref 5–15)
BUN: 27 mg/dL — ABNORMAL HIGH (ref 8–23)
CO2: 22 mmol/L (ref 22–32)
Calcium: 8.4 mg/dL — ABNORMAL LOW (ref 8.9–10.3)
Chloride: 106 mmol/L (ref 98–111)
Creatinine, Ser: 0.99 mg/dL (ref 0.61–1.24)
GFR calc Af Amer: >60 mL/min (ref >60)
GFR calc non Af Amer: >60 mL/min (ref >60)
Glucose, Bld: 107 mg/dL — ABNORMAL HIGH (ref 70–99)
Potassium: 3.6 mmol/L (ref 3.5–5.1)
Sodium: 139 mmol/L (ref 135–145)

## 2019-09-17 LAB — PROTIME-INR
INR: 2.2 — ABNORMAL HIGH (ref 0.8–1.2)
Prothrombin Time: 24.2 seconds — ABNORMAL HIGH (ref 11.4–15.2)

## 2019-09-17 MED ORDER — FUROSEMIDE 20 MG PO TABS
20.0000 mg | ORAL_TABLET | Freq: Every day | ORAL | Status: DC
  Administered 2019-09-17 – 2019-09-22 (×6): 20 mg via ORAL
  Filled 2019-09-17 (×6): qty 1

## 2019-09-17 MED ORDER — WARFARIN SODIUM 2.5 MG PO TABS
2.5000 mg | ORAL_TABLET | Freq: Once | ORAL | Status: AC
  Administered 2019-09-17: 17:00:00 2.5 mg via ORAL
  Filled 2019-09-17: qty 1

## 2019-09-17 NOTE — Progress Notes (Signed)
ANTICOAGULATION CONSULT NOTE - Follow Up Consult  Pharmacy Consult for Heparin>>Warfarin Indication: atrial fibrillation  Allergies  Allergen Reactions  . Apixaban Palpitations, Rash and Other (See Comments)    Worsening tardive dyskinesia and rapid heart rate  . Ace Inhibitors Cough  . Demerol [Meperidine] Other (See Comments)    Parkinsons disease  . Poison Ivy Extract Itching, Swelling and Rash    Patient Measurements: Height: 5\' 8"  (172.7 cm) Weight: 169 lb 12.8 oz (77 kg) IBW/kg (Calculated) : 68.4  Vital Signs: Temp: 98.3 F (36.8 C) (10/26 0818) Temp Source: Oral (10/26 0539) BP: 124/89 (10/26 0818) Pulse Rate: 98 (10/26 0818)  Labs: Recent Labs    09/14/19 1432 09/15/19 0405 09/15/19 1842 09/16/19 0521 09/16/19 1458 09/17/19 0410  HGB 15.4 14.5  --  13.6  --  13.1  HCT 48.4 43.3  --  41.3  --  39.4  PLT 108* 103*  --  111*  --  114*  LABPROT  --   --   --  20.9*  --  24.2*  INR  --   --   --  1.8*  --  2.2*  HEPARINUNFRC  --   --  0.12* 0.23* 0.65  --   CREATININE 1.04  --   --   --   --  0.99    Estimated Creatinine Clearance: 52.8 mL/min (by C-G formula based on SCr of 0.99 mg/dL).  Assessment: 85 year old male on warfarin prior to admission for Afib, now s/p hip surgery. Patient was reversed with vitamin K 10mg  IV x 1 on 10/22, and heparin and warfarin started post-op on 10/24.   Heparin was stopped at 7pm for concern of hematuria. Urine is tea-colored and not bloody per RN this AM. Hgb stable  INR today is 2.2 therefore will d/c heparin order and resume warfarin at home dose of 2.5mg .    Goal of Therapy:  INR 2-3 Monitor platelets by anticoagulation protocol: Yes   Plan:  -D/C heparin drip -Warfarin 2.5mg  PO x 1 tonight  -Daily INR -CBC in am   [name] [name] A. Levada Dy, PharmD, BCPS, FNKF Clinical Pharmacist [place] Please utilize Amion for appropriate phone number to reach the unit pharmacist ([place])

## 2019-09-17 NOTE — Care Management Important Message (Signed)
Important Message  Patient Details  Name: [name] [name] MRN: [id number] Date of Birth: 02-09-1932   Medicare Important Message Given:  Yes     Judy Goodenow Montine Circle 09/17/2019, 3:33 PM

## 2019-09-17 NOTE — Progress Notes (Signed)
Progress Note  Patient Name: [name] [name] Date of Encounter: 09/17/2019  Primary Cardiologist: Chrystie Nose, MD   Subjective   Pt with eyes closed but answers questions. He is pleasant but disoriented. States that is out next to a bridge. He denies any chest pain/pressure, heart fluttering, difficulty breathing.   Inpatient Medications    Scheduled Meds: . acetaminophen  650 mg Oral Q6H  . amantadine  100 mg Oral BID  . atorvastatin  20 mg Oral QPM  . carbidopa-levodopa  3 tablet Oral 2 times per day   And  . carbidopa-levodopa  2 tablet Oral q1800   And  . carbidopa-levodopa  1 tablet Oral QHS  . donepezil  10 mg Oral QHS  . escitalopram  10 mg Oral Daily  . metoprolol succinate  12.5 mg Oral Daily  . selegiline  5 mg Oral QAC breakfast   And  . selegiline  2.5 mg Oral QAC supper  . Warfarin - Pharmacist Dosing Inpatient   Does not apply q1800   Continuous Infusions: . heparin 1,400 Units/hr (09/16/19 0955)  . lactated ringers Stopped (09/14/19 1300)   PRN Meds: bisacodyl, HYDROcodone-acetaminophen, methocarbamol, morphine injection   Vital Signs    Vitals:   09/17/19 0539 09/17/19 0542 09/17/19 0550 09/17/19 0818  BP: 121/88 (!) 131/115 118/80 124/89  Pulse: (!) 147 (!) 101 93 98  Resp: 18 18 18 18   Temp: 98.2 F (36.8 C)   98.3 F (36.8 C)  TempSrc: Oral     SpO2: 92% 92% 97% 98%  Weight:      Height:        Intake/Output Summary (Last 24 hours) at 09/17/2019 0905 Last data filed at 09/17/2019 0900 Gross per 24 hour  Intake 480 ml  Output 400 ml  Net 80 ml   Last 3 Weights 09/14/2019 09/05/2019 08/24/2019  Weight (lbs) 169 lb 12.8 oz 169 lb 12.8 oz 177 lb 9.6 oz  Weight (kg) 77.021 kg 77.021 kg 80.559 kg      Telemetry    Atrial fibrillation in the 90's.  - Personally Reviewed  ECG    Now new tracings - Personally Reviewed  Physical Exam   GEN: No acute distress.   Neck: No JVD Cardiac: Irregularly irregular rhythm, 2/6 systolic  murmur at apex Respiratory: Clear to auscultation bilaterally. GI: Soft, nontender, non-distended  MS: No edema; No deformity. Neuro:  Oriented to self, not place or time Psych: Normal affect   Labs    High Sensitivity Troponin:  No results for input(s): TROPONINIHS in the last 720 hours.    Chemistry Recent Labs  Lab 09/12/19 1925 09/14/19 1432 09/17/19 0410  NA 139 142 139  K 4.0 3.5 3.6  CL 103 106 106  CO2 27 26 22   GLUCOSE 139* 123* 107*  BUN 26* 23 27*  CREATININE 1.20 1.04 0.99  CALCIUM 9.0 8.2* 8.4*  GFRNONAA 55* >60 >60  GFRAA >60 >60 >60  ANIONGAP 9 10 11      Hematology Recent Labs  Lab 09/15/19 0405 09/16/19 0521 09/17/19 0410  WBC 5.9 6.2 5.4  RBC 4.44 4.25 4.09*  HGB 14.5 13.6 13.1  HCT 43.3 41.3 39.4  MCV 97.5 97.2 96.3  MCH 32.7 32.0 32.0  MCHC 33.5 32.9 33.2  RDW 16.5* 16.3* 16.2*  PLT 103* 111* 114*    BNPNo results for input(s): BNP, PROBNP in the last 168 hours.   DDimer No results for input(s): DDIMER in the last 168 hours.  Radiology    No results found.  Cardiac Studies   Echo 08/2019 1. Left ventricular ejection fraction, by visual estimation, is 45 to 50%. The left ventricle has mildly decreased function. Mildly increased left ventricular size. There is no left ventricular hypertrophy. Mild diffuse hypokinesis. 2. Left ventricular diastolic Doppler parameters are indeterminate pattern of LV diastolic filling. 3. Global right ventricle has moderately reduced systolic function.The right ventricular size is mildly enlarged. No increase in right ventricular wall thickness. 4. The patient is status post mitral valve repair. There appears to be severe mitral regurgitation, eccentric and posteriorly-directed. The mean gradient was 7 mmHg but MVA was normal by PHT, I think that the elevated mean gradient is due to high flow  from mitral regurgitation rather than significant mitral stenosis. 5. The aortic valve is tricuspid Aortic  valve regurgitation is mild by color flow Doppler. Mild aortic valve sclerosis without stenosis. 6. There is mild dilatation of the aortic root measuring 39 mm. 7. The tricuspid valve is normal in structure. Tricuspid valve regurgitation moderate. 8. Left atrial size was severely dilated. 9. Right atrial size was severely dilated. 10. The IVC was not visualized. Peak RV-RA gradient 28 mmHg.   Patient Profile     85 y.o. male with a hx of endocarditis s/pmitral valve repairin2008, cryptogenic stroke with subsequent discover of paroxysmal Afib by implantable loop recorder, hyperlipidemia, and Parkinson's diseasewho is being seen for the evaluation of pre-operative evaluation for left intertrochanteric fx repairat the request of Dr. Karleen Hampshire. S/P hip surgery on 09/14/19. Now in afib.    Assessment & Plan    Hip Fracture -POD 3 from hip repair surgery  PAF -Pt continues to be in atrial fibrillation. Rates controlled in the 90's on beta blocker. BP soft but stable. Unable to uptitrate BB. -Restarted on warfarin. INR goal 2-3. INR 2.2 today. Heparin now stopped.  -Not felt likely to maintain NSR given his severe MR  S/P MV repair, now with severe MR by recent echo -Plan for eval with TEE after he recovers from hip surgery. Was planned for this week but may have to be postponed. (prior office note indicates that the pt did not want to have TEE as he would not want to have another valve surgery)  Mild LV dysfunction -EF 45-50% by echo earlier this month. -Appears euvolemic on exam.      For questions or updates, please contact [place] Please consult [email or web address] for contact info under        Signed, Daune Perch, NP  09/17/2019, 9:05 AM

## 2019-09-17 NOTE — Progress Notes (Signed)
PROGRESS NOTE    [name] HensenJohn [name]  WGN:[id number] DOB: 01/27/1932 DOA: 09/12/2019 PCP: Rodrigo RanPerini, Mark, MD    Brief Narrative:   85 year old male with prior h/o parkinson's disease, MVR , h/o endocarditis, MV angioplasty in 2008. PAF on coumadin, TIA, hyperlipidemia, presents after a fall and was found to have acute displaced left intertrochanteric fracture. Cardiology consulted for pre op clearance.  Orthopedics was consulted and he underwent s/p intramedullary nail placement on 09/14/19.     PT/OT evaluation recommending CIR. Consult placed. Overnight patient had hematuria for which IV heparin was stopped.  Urinalysis does not show significant RBC.   Assessment & Plan:   Active Problems:   Displaced intertrochanteric fracture of left femur, initial encounter for closed fracture (HCC)   Acute left femur fracture: underwent surgical fixation and working with PT and OT, recommending CIR.   CIR consult placed.  Orthopedics recommended weight bearing as tolerated with assistance. Pain well controlled today.  Currently waiting for further recommendations from CIR.    Parkinson's disease:  Continue with carbidopa, levodopa, selegiline and amantadine.  Dementia:  Calm, without any agitation continue with Aricept.   Hyperlipidemia:  Continue with lipitor.    H/o MVR and paroxysmal atrial fibrillation:  Rate control with metoprolol and on Coumadin for anticoagulation.  INR this morning is therapeutic.  DC IV heparin.  Cardiology consulted and appreciate recommendations.    Chronic systolic heart failure with severe MR:  Echocardiogram earlier this month shows LVEF of 45% to 50% with diffuse hypokinesis.  TEE scheduled this admission in view of recent hip fracture., it was postponed to be done after rehab Restart Lasix 20 mg daily  Chronic mild thrombocytopenia.  Chronic and mild.   An episode of hematuria yesterday evening Resolved Urine analysis does not show any infection or  RBCs   DVT prophylaxis: Heparin Code Status: Full code Family Communication: none at bedside.  Disposition Plan: Pending CIR  Consultants:   Cardiology  Orthopedics  Procedures: OR on 09/14/2019 Antimicrobials: 1 dose of Ancef perioperatively  Subjective: No chest pain or shortness of breath, pain well controlled no new complaints. Objective: Vitals:   09/17/19 0550 09/17/19 0818 09/17/19 1317 09/17/19 1319  BP: 118/80 124/89 (!) 174/100 111/83  Pulse: 93 98 (!) 119 90  Resp: 18 18 17    Temp:  98.3 F (36.8 C) 98.1 F (36.7 C)   TempSrc:      SpO2: 97% 98% (!) 77% 99%  Weight:      Height:        Intake/Output Summary (Last 24 hours) at 09/17/2019 1342 Last data filed at 09/17/2019 0900 Gross per 24 hour  Intake 480 ml  Output 650 ml  Net -170 ml   Filed Weights   09/14/19 1010  Weight: 77 kg    Examination:  General exam: Alert and comfortable not in any kind of distress Respiratory system: Clear to auscultation bilaterally, no wheezing or rhonchi  cardiovascular system: S1-S2 heard, irregular, no JVD, no pedal edema Gastrointestinal system: Abdomen is soft, nontender, nondistended, bowel sounds heard normal Central nervous system: Alert and answering questions appropriately and following commands. Extremities: No pedal edema, cyanosis or clubbing Skin: No rashes or ulcers seen Psychiatry: Mood is appropriate    Data Reviewed: I have personally reviewed following labs and imaging studies  CBC: Recent Labs  Lab 09/12/19 1925 09/14/19 1432 09/15/19 0405 09/16/19 0521 09/17/19 0410  WBC 7.8 7.4 5.9 6.2 5.4  HGB 14.8 15.4 14.5 13.6 13.1  HCT  45.6 48.4 43.3 41.3 39.4  MCV 98.5 99.0 97.5 97.2 96.3  PLT 108* 108* 103* 111* 114*   Basic Metabolic Panel: Recent Labs  Lab 09/12/19 1925 09/14/19 1432 09/17/19 0410  NA 139 142 139  K 4.0 3.5 3.6  CL 103 106 106  CO2 27 26 22   GLUCOSE 139* 123* 107*  BUN 26* 23 27*  CREATININE 1.20 1.04 0.99   CALCIUM 9.0 8.2* 8.4*   GFR: Estimated Creatinine Clearance: 52.8 mL/min (by C-G formula based on SCr of 0.99 mg/dL). Liver Function Tests: No results for input(s): AST, ALT, ALKPHOS, BILITOT, PROT, ALBUMIN in the last 168 hours. No results for input(s): LIPASE, AMYLASE in the last 168 hours. No results for input(s): AMMONIA in the last 168 hours. Coagulation Profile: Recent Labs  Lab 09/13/19 0520 09/13/19 1954 09/14/19 0245 09/16/19 0521 09/17/19 0410  INR 3.8* 1.9* 1.5* 1.8* 2.2*   Cardiac Enzymes: No results for input(s): CKTOTAL, CKMB, CKMBINDEX, TROPONINI in the last 168 hours. BNP (last 3 results) No results for input(s): PROBNP in the last 8760 hours. HbA1C: No results for input(s): HGBA1C in the last 72 hours. CBG: No results for input(s): GLUCAP in the last 168 hours. Lipid Profile: No results for input(s): CHOL, HDL, LDLCALC, TRIG, CHOLHDL, LDLDIRECT in the last 72 hours. Thyroid Function Tests: No results for input(s): TSH, T4TOTAL, FREET4, T3FREE, THYROIDAB in the last 72 hours. Anemia Panel: No results for input(s): VITAMINB12, FOLATE, FERRITIN, TIBC, IRON, RETICCTPCT in the last 72 hours. Sepsis Labs: No results for input(s): PROCALCITON, LATICACIDVEN in the last 168 hours.  Recent Results (from the past 240 hour(s))  SARS CORONAVIRUS 2 (TAT 6-24 HRS) Nasopharyngeal Nasopharyngeal Swab     Status: None   Collection Time: 09/12/19  8:44 PM   Specimen: Nasopharyngeal Swab  Result Value Ref Range Status   SARS Coronavirus 2 NEGATIVE NEGATIVE Final    Comment: (NOTE) SARS-CoV-2 target nucleic acids are NOT DETECTED. The SARS-CoV-2 RNA is generally detectable in upper and lower respiratory specimens during the acute phase of infection. Negative results do not preclude SARS-CoV-2 infection, do not rule out co-infections with other pathogens, and should not be used as the sole basis for treatment or other patient management decisions. Negative results must be  combined with clinical observations, patient history, and epidemiological information. The expected result is Negative. Fact Sheet for Patients: 09/14/19 Fact Sheet for Healthcare Providers: HairSlick.no This test is not yet approved or cleared by the quierodirigir.com FDA and  has been authorized for detection and/or diagnosis of SARS-CoV-2 by FDA under an Emergency Use Authorization (EUA). This EUA will remain  in effect (meaning this test can be used) for the duration of the COVID-19 declaration under Section 56 4(b)(1) of the Act, 21 U.S.C. section 360bbb-3(b)(1), unless the authorization is terminated or revoked sooner. Performed at [facility], [address], [place], [place]   MRSA PCR Screening     Status: None   Collection Time: 09/14/19 12:55 AM   Specimen: Nasal Mucosa; Nasopharyngeal  Result Value Ref Range Status   MRSA by PCR NEGATIVE NEGATIVE Final    Comment:        The GeneXpert MRSA Assay (FDA approved for NASAL specimens only), is one component of a comprehensive MRSA colonization surveillance program. It is not intended to diagnose MRSA infection nor to guide or monitor treatment for MRSA infections. Performed at [facility], [address], [place], [place]  Radiology Studies: No results found.      Scheduled Meds: . acetaminophen  650 mg Oral Q6H  . amantadine  100 mg Oral BID  . atorvastatin  20 mg Oral QPM  . carbidopa-levodopa  3 tablet Oral 2 times per day   And  . carbidopa-levodopa  2 tablet Oral q1800   And  . carbidopa-levodopa  1 tablet Oral QHS  . donepezil  10 mg Oral QHS  . escitalopram  10 mg Oral Daily  . metoprolol succinate  12.5 mg Oral Daily  . selegiline  5 mg Oral QAC breakfast   And  . selegiline  2.5 mg Oral QAC supper  . warfarin  2.5 mg Oral ONCE-1800  . Warfarin - Pharmacist Dosing Inpatient   Does not apply  q1800   Continuous Infusions: . lactated ringers Stopped (09/14/19 1300)     LOS: 4 days        Hosie Poisson, MD [facility] Pager [phone] If 7PM-7AM, please contact night-coverage [email or web address] Password [email or web address] 09/17/2019, 1:42 PM

## 2019-09-17 NOTE — Plan of Care (Signed)
  Problem: Pain Managment: Goal: General experience of comfort will improve Outcome: Progressing   Problem: Safety: Goal: Ability to remain free from injury will improve Outcome: Progressing   Problem: Skin Integrity: Goal: Risk for impaired skin integrity will decrease Outcome: Progressing   

## 2019-09-17 NOTE — Progress Notes (Signed)
Physical Therapy Treatment Patient Details Name: [name] [name] MRN: [id number] DOB: [birth year] Today's Date: 09/17/2019    History of Present Illness Pt is a 85 yr old male who had a fall and s/p Cephalomedullary nailing of left intertrochanteric femur fracture. PMH but not limited to: dyslipidemia, parkinson's s/p mitral valve replacement, stroke     PT Comments    Patient received in bed, eyes shut but restless, wife reports he has been wanting to get OOB. Able to perform bed mobility with MaxA, able to sit at EOB with min guard for safety due to posterior lean but did self-correct posture with VC. Attempted standing twice with RW with maxA both times, attempted pivot transfer but unable to safely due to posterior unsteadiness today. Switched to stedy transfer, able to stand with maxA in stedy, performed pivot to chair totalA in stedy lift. He was left up in the chair with all needs met, chair alarm active, and wife present this morning. VSS during session.     Follow Up Recommendations  CIR;Supervision/Assistance - 24 hour     Equipment Recommendations  Wheelchair (measurements PT)    Recommendations for Other Services       Precautions / Restrictions Precautions Precautions: Fall Restrictions Weight Bearing Restrictions: Yes LLE Weight Bearing: Weight bearing as tolerated    Mobility  Bed Mobility Overal bed mobility: Needs Assistance Bed Mobility: Supine to Sit     Supine to sit: Max assist     General bed mobility comments: MaxA for supine to sit at EOB, posterior lean requiring S-Min guard for safety but able to self-correct with VC  Transfers Overall transfer level: Needs assistance Equipment used: Rolling walker (2 wheeled) Transfers: Sit to/from [facility] Sit to Stand: Max assist Stand pivot transfers: Total assist       General transfer comment: attempted sit to stand twice with RW, heavy MaxA on both attempts and unable to move feet today  without falling backwards due to posterior LOB; able to stand with maxA in stedy and used lift for totalA pivot to bed for safety  Ambulation/Gait             General Gait Details: needs +2 assist for safety (unavailble today)   Stairs             Wheelchair Mobility    Modified Rankin (Stroke Patients Only)       Balance Overall balance assessment: Needs assistance Sitting-balance support: Feet supported;Bilateral upper extremity supported Sitting balance-Leahy Scale: Fair   Postural control: Posterior lean Standing balance support: Bilateral upper extremity supported Standing balance-Leahy Scale: Poor                              Cognition Arousal/Alertness: Awake/alert Behavior During Therapy: WFL for tasks assessed/performed Overall Cognitive Status: History of cognitive impairments - at baseline                                 General Comments: patient with eyes closed throughout session, VC for eyes open and participation in session      Exercises      General Comments        Pertinent Vitals/Pain Pain Assessment: Faces Pain Score: 0-No pain Faces Pain Scale: No hurt Pain Intervention(s): Limited activity within patient's tolerance;Monitored during session    Home Living  Prior Function            PT Goals (current goals can now be found in the care plan section) Acute Rehab PT Goals Patient Stated Goal: to get stronger PT Goal Formulation: With patient/family Time For Goal Achievement: 09/29/19 Potential to Achieve Goals: Good Progress towards PT goals: Progressing toward goals    Frequency    Min 5X/week      PT Plan Current plan remains appropriate    Co-evaluation              AM-PAC PT "6 Clicks" Mobility   Outcome Measure  Help needed turning from your back to your side while in a flat bed without using bedrails?: A Lot Help needed moving from lying on your  back to sitting on the side of a flat bed without using bedrails?: A Lot Help needed moving to and from a bed to a chair (including a wheelchair)?: A Lot Help needed standing up from a chair using your arms (e.g., wheelchair or bedside chair)?: A Lot Help needed to walk in hospital room?: A Lot Help needed climbing 3-5 steps with a railing? : Total 6 Click Score: 11    End of Session Equipment Utilized During Treatment: Gait belt Activity Tolerance: Patient tolerated treatment well Patient left: in chair;with call bell/phone within reach;with chair alarm set;with family/visitor present   PT Visit Diagnosis: Pain;Difficulty in walking, not elsewhere classified (R26.2);Unsteadiness on feet (R26.81) Pain - Right/Left: Left Pain - part of body: Hip     Time: 2241-1464 PT Time Calculation (min) (ACUTE ONLY): 31 min  Charges:  $Therapeutic Activity: 23-37 mins                     Deniece Ree PT, DPT, CBIS  Supplemental Physical Therapist [place]    Pager [phone] Acute Rehab Office [phone]

## 2019-09-17 NOTE — Progress Notes (Signed)
Notified Dr Kennon Holter of pt's fluctuating  [place] yellow at 0408 w BP 98/86, p.110. Recheck at 0539 121/88 P147, @ 0542 131/115 P101, @0550  BP 118/50, P93. O2 sats have been ranging 92-97% on RA. Will continue to monitor.

## 2019-09-17 NOTE — Plan of Care (Signed)
  Problem: Pain Managment: Goal: General experience of comfort will improve Outcome: Progressing   Problem: Safety: Goal: Ability to remain free from injury will improve Outcome: Progressing   

## 2019-09-18 ENCOUNTER — Ambulatory Visit: Payer: Medicare Other | Admitting: Adult Health

## 2019-09-18 DIAGNOSIS — S72142A Displaced intertrochanteric fracture of left femur, initial encounter for closed fracture: Secondary | ICD-10-CM | POA: Diagnosis not present

## 2019-09-18 LAB — CBC
HCT: 38.8 % — ABNORMAL LOW (ref 39.0–52.0)
Hemoglobin: 13 g/dL (ref 13.0–17.0)
MCH: 32 pg (ref 26.0–34.0)
MCHC: 33.5 g/dL (ref 30.0–36.0)
MCV: 95.6 fL (ref 80.0–100.0)
Platelets: 124 10*3/uL — ABNORMAL LOW (ref 150–400)
RBC: 4.06 MIL/uL — ABNORMAL LOW (ref 4.22–5.81)
RDW: 16.2 % — ABNORMAL HIGH (ref 11.5–15.5)
WBC: 5.9 10*3/uL (ref 4.0–10.5)
nRBC: 0 % (ref 0.0–0.2)

## 2019-09-18 LAB — PROTIME-INR
INR: 2.8 — ABNORMAL HIGH (ref 0.8–1.2)
Prothrombin Time: 29.3 seconds — ABNORMAL HIGH (ref 11.4–15.2)

## 2019-09-18 MED ORDER — WARFARIN SODIUM 2 MG PO TABS
2.0000 mg | ORAL_TABLET | Freq: Once | ORAL | Status: AC
  Administered 2019-09-18: 2 mg via ORAL
  Filled 2019-09-18: qty 1

## 2019-09-18 NOTE — Progress Notes (Signed)
ANTICOAGULATION CONSULT NOTE - Follow Up Consult  Pharmacy Consult for Warfarin Indication: atrial fibrillation  Allergies  Allergen Reactions  . Apixaban Palpitations, Rash and Other (See Comments)    Worsening tardive dyskinesia and rapid heart rate  . Ace Inhibitors Cough  . Demerol [Meperidine] Other (See Comments)    Parkinsons disease  . Poison Ivy Extract Itching, Swelling and Rash    Patient Measurements: Height: 5\' 8"  (172.7 cm) Weight: 169 lb 12.8 oz (77 kg) IBW/kg (Calculated) : 68.4  Vital Signs: Temp: 98.9 F (37.2 C) (10/27 0300) BP: 108/70 (10/27 0300) Pulse Rate: 103 (10/27 0300)  Labs: Recent Labs    09/15/19 1842  09/16/19 0521 09/16/19 1458 09/17/19 0410 09/18/19 0331  HGB  --    < > 13.6  --  13.1 13.0  HCT  --   --  41.3  --  39.4 38.8*  PLT  --   --  111*  --  114* 124*  LABPROT  --   --  20.9*  --  24.2* 29.3*  INR  --   --  1.8*  --  2.2* 2.8*  HEPARINUNFRC 0.12*  --  0.23* 0.65  --   --   CREATININE  --   --   --   --  0.99  --    < > = values in this interval not displayed.    Estimated Creatinine Clearance: 52.8 mL/min (by C-G formula based on SCr of 0.99 mg/dL).  Assessment: 85 year old male on warfarin prior to admission for Afib, now s/p hip surgery. Patient was reversed with vitamin K 10mg  IV x 1 on 10/22, and heparin and warfarin started post-op on 10/24.   Heparin was stopped at 7pm for concern of hematuria. Urine is tea-colored and not bloody per RN this AM. Hgb stable  INR 2.2>> 2.8. Increasing steadily, may risk becoming supratherapeutic soon. Will give lower dose. No bleeding noted.     Goal of Therapy:  INR 2-3 Monitor platelets by anticoagulation protocol: Yes   Plan:  -Warfarin 2 mg PO x 1 tonight  -Daily INR -CBC in am   Valora Norell A. Levada Dy, PharmD, BCPS, FNKF Clinical Pharmacist [place] Please utilize Amion for appropriate phone number to reach the unit pharmacist ([place])

## 2019-09-18 NOTE — Plan of Care (Signed)
  Problem: Pain Managment: Goal: General experience of comfort will improve Outcome: Progressing   Problem: Clinical Measurements: Goal: Ability to maintain clinical measurements within normal limits will improve Outcome: Progressing   Problem: Coping: Goal: Level of anxiety will decrease Outcome: Not Progressing   

## 2019-09-18 NOTE — Consult Note (Signed)
Inpatient Rehab Admissions:  Inpatient Rehab Consult received.  I met with pt and his wife at the bedside for rehabilitation assessment and to discuss goals and expectations of an inpatient rehab admission. Feel pt is a good candidate for CIR at this time. Wife and pt would like to proceed with IP Rehab. I have reviewed anticipated length of stay and anticipated assist needed at DC. I will begin insurance authorization process for possible admit.   Jhonnie Garner, OTR/L  Rehab Admissions Coordinator  [phone] 09/18/2019 12:24 PM

## 2019-09-18 NOTE — Progress Notes (Signed)
PROGRESS NOTE    Cherly HensenJohn Burnley  ZOX:[id number] DOB: 07/04/1932 DOA: 09/12/2019 PCP: Rodrigo RanPerini, Mark, MD    Brief Narrative:   85 year old male with prior h/o parkinson's disease, MVR , h/o endocarditis, MV angioplasty in 2008. PAF on coumadin, TIA, hyperlipidemia, presents after a fall and was found to have acute displaced left intertrochanteric fracture. Cardiology consulted for pre op clearance.  Orthopedics was consulted and he underwent s/p intramedullary nail placement on 09/14/19.     PT/OT evaluation recommending CIR. Consult placed and waiting to hear from CIR for discharge.    Assessment & Plan:   Active Problems:   Displaced intertrochanteric fracture of left femur, initial encounter for closed fracture (HCC)   Acute left femur fracture: underwent surgical fixation and working with PT and OT, recommending CIR.   CIR consult placed.  Orthopedics recommended weight bearing as tolerated with assistance. Pain well controlled. No new complaints.      Parkinson's disease:  Continue with carbidopa, levodopa, selegiline and amantadine.  Pt sitting in the chair and reading a newspaper without any complaints.    Dementia:  Calm, no agitation. Resume aricept.    Hyperlipidemia:  Continue with lipitor.    H/o MVR and paroxysmal atrial fibrillation:  Rate control with metoprolol and on Coumadin for anticoagulation. INR is therapeutic.  Cardiology consulted and appreciate recommendations.    Chronic systolic heart failure with severe MR:  Echocardiogram earlier this month shows LVEF of 45% to 50% with diffuse hypokinesis.  TEE scheduled this admission in view of recent hip fracture., it was postponed to be done after rehab Restart Lasix 20 mg daily  Chronic mild thrombocytopenia.  Chronic and mild.   An episode of hematuria  Resolved on discontinuing the IV heparin.  Urine analysis does not show any infection or RBCs.   DVT prophylaxis: coumadin.  Code Status:  Full code Family Communication: wife at bedside.  Disposition Plan: Pending CIR, stable for discharge to CIR when bed available.   Consultants:   Cardiology  Orthopedics  Procedures: OR on 09/14/2019 Antimicrobials: 1 dose of Ancef perioperatively  Subjective: Pt sitting in the chair and reading news paper. No new complaints.  Objective: Vitals:   09/17/19 1319 09/17/19 2100 09/18/19 0300 09/18/19 1436  BP: 111/83 118/76 108/70 110/83  Pulse: 90 88 (!) 103 87  Resp: 18 18 18 18   Temp: (!) 97.1 F (36.2 C) 97.8 F (36.6 C) 98.9 F (37.2 C) 97.6 F (36.4 C)  TempSrc: Oral   Oral  SpO2: 99% 99% 100% 98%  Weight:      Height:        Intake/Output Summary (Last 24 hours) at 09/18/2019 1557 Last data filed at 09/18/2019 1330 Gross per 24 hour  Intake 600 ml  Output -  Net 600 ml   Filed Weights   09/14/19 1010  Weight: 77 kg    Examination:  General exam: alert and comfortable.  Respiratory system: clear to auscultation bilaterally, no wheezing or rhonchi.  cardiovascular system: S1S2, IRREGULAR, no JVD, no pedal edema.  Gastrointestinal system: Abdomen is soft, non tender , non distended, bowel sounds normal.  Central nervous system: Alert and answering questions appropriately, tremors of the lower extremities seen.  Extremities: no pedal edema, cyanosis or clubbing.  Skin: no rashes or ulcers.  Psychiatry: mood is appropriate.     Data Reviewed: I have personally reviewed following labs and imaging studies  CBC: Recent Labs  Lab 09/14/19 1432 09/15/19 0405 09/16/19 0521 09/17/19 0410  09/18/19 0331  WBC 7.4 5.9 6.2 5.4 5.9  HGB 15.4 14.5 13.6 13.1 13.0  HCT 48.4 43.3 41.3 39.4 38.8*  MCV 99.0 97.5 97.2 96.3 95.6  PLT 108* 103* 111* 114* 144*   Basic Metabolic Panel: Recent Labs  Lab 09/12/19 1925 09/14/19 1432 09/17/19 0410  NA 139 142 139  K 4.0 3.5 3.6  CL 103 106 106  CO2 27 26 22   GLUCOSE 139* 123* 107*  BUN 26* 23 27*  CREATININE  1.20 1.04 0.99  CALCIUM 9.0 8.2* 8.4*   GFR: Estimated Creatinine Clearance: 52.8 mL/min (by C-G formula based on SCr of 0.99 mg/dL). Liver Function Tests: No results for input(s): AST, ALT, ALKPHOS, BILITOT, PROT, ALBUMIN in the last 168 hours. No results for input(s): LIPASE, AMYLASE in the last 168 hours. No results for input(s): AMMONIA in the last 168 hours. Coagulation Profile: Recent Labs  Lab 09/13/19 1954 09/14/19 0245 09/16/19 0521 09/17/19 0410 09/18/19 0331  INR 1.9* 1.5* 1.8* 2.2* 2.8*   Cardiac Enzymes: No results for input(s): CKTOTAL, CKMB, CKMBINDEX, TROPONINI in the last 168 hours. BNP (last 3 results) No results for input(s): PROBNP in the last 8760 hours. HbA1C: No results for input(s): HGBA1C in the last 72 hours. CBG: No results for input(s): GLUCAP in the last 168 hours. Lipid Profile: No results for input(s): CHOL, HDL, LDLCALC, TRIG, CHOLHDL, LDLDIRECT in the last 72 hours. Thyroid Function Tests: No results for input(s): TSH, T4TOTAL, FREET4, T3FREE, THYROIDAB in the last 72 hours. Anemia Panel: No results for input(s): VITAMINB12, FOLATE, FERRITIN, TIBC, IRON, RETICCTPCT in the last 72 hours. Sepsis Labs: No results for input(s): PROCALCITON, LATICACIDVEN in the last 168 hours.  Recent Results (from the past 240 hour(s))  SARS CORONAVIRUS 2 (TAT 6-24 HRS) Nasopharyngeal Nasopharyngeal Swab     Status: None   Collection Time: 09/12/19  8:44 PM   Specimen: Nasopharyngeal Swab  Result Value Ref Range Status   SARS Coronavirus 2 NEGATIVE NEGATIVE Final    Comment: (NOTE) SARS-CoV-2 target nucleic acids are NOT DETECTED. The SARS-CoV-2 RNA is generally detectable in upper and lower respiratory specimens during the acute phase of infection. Negative results do not preclude SARS-CoV-2 infection, do not rule out co-infections with other pathogens, and should not be used as the sole basis for treatment or other patient management decisions. Negative  results must be combined with clinical observations, patient history, and epidemiological information. The expected result is Negative. Fact Sheet for Patients: SugarRoll.be Fact Sheet for Healthcare Providers: https://www.woods-mathews.com/ This test is not yet approved or cleared by the [place] FDA and  has been authorized for detection and/or diagnosis of SARS-CoV-2 by FDA under an Emergency Use Authorization (EUA). This EUA will remain  in effect (meaning this test can be used) for the duration of the COVID-19 declaration under Section 56 4(b)(1) of the Act, 21 U.S.C. section 360bbb-3(b)(1), unless the authorization is terminated or revoked sooner. Performed at [facility], [place] [address], [place], [place] [address]   MRSA PCR Screening     Status: None   Collection Time: 09/14/19 12:55 AM   Specimen: Nasal Mucosa; Nasopharyngeal  Result Value Ref Range Status   MRSA by PCR NEGATIVE NEGATIVE Final    Comment:        The GeneXpert MRSA Assay (FDA approved for NASAL specimens only), is one component of a comprehensive MRSA colonization surveillance program. It is not intended to diagnose MRSA infection nor to guide or monitor treatment for MRSA  infections. Performed at [facility], [address], [place], [place] [address]          Radiology Studies: No results found.      Scheduled Meds: . acetaminophen  650 mg Oral Q6H  . amantadine  100 mg Oral BID  . atorvastatin  20 mg Oral QPM  . carbidopa-levodopa  3 tablet Oral 2 times per day   And  . carbidopa-levodopa  2 tablet Oral q1800   And  . carbidopa-levodopa  1 tablet Oral QHS  . donepezil  10 mg Oral QHS  . escitalopram  10 mg Oral Daily  . furosemide  20 mg Oral Daily  . metoprolol succinate  12.5 mg Oral Daily  . selegiline  5 mg Oral QAC breakfast   And  . selegiline  2.5 mg Oral QAC supper  . warfarin  2 mg Oral ONCE-1800  . Warfarin  - Pharmacist Dosing Inpatient   Does not apply q1800   Continuous Infusions: . lactated ringers Stopped (09/14/19 1300)     LOS: 5 days        Kathlen Mody, MD [facility] Pager [phone] If 7PM-7AM, please contact night-coverage [email or web address] Password [facility] 09/18/2019, 3:57 PM

## 2019-09-18 NOTE — Plan of Care (Signed)

## 2019-09-19 ENCOUNTER — Ambulatory Visit (HOSPITAL_COMMUNITY): Admission: RE | Admit: 2019-09-19 | Payer: Medicare Other | Source: Home / Self Care | Admitting: Internal Medicine

## 2019-09-19 ENCOUNTER — Encounter (HOSPITAL_COMMUNITY): Admission: RE | Payer: Self-pay | Source: Home / Self Care

## 2019-09-19 DIAGNOSIS — S72142A Displaced intertrochanteric fracture of left femur, initial encounter for closed fracture: Secondary | ICD-10-CM | POA: Diagnosis not present

## 2019-09-19 LAB — CBC
HCT: 39.6 % (ref 39.0–52.0)
Hemoglobin: 13.2 g/dL (ref 13.0–17.0)
MCH: 32 pg (ref 26.0–34.0)
MCHC: 33.3 g/dL (ref 30.0–36.0)
MCV: 95.9 fL (ref 80.0–100.0)
Platelets: 146 10*3/uL — ABNORMAL LOW (ref 150–400)
RBC: 4.13 MIL/uL — ABNORMAL LOW (ref 4.22–5.81)
RDW: 16.4 % — ABNORMAL HIGH (ref 11.5–15.5)
WBC: 5.5 10*3/uL (ref 4.0–10.5)
nRBC: 0 % (ref 0.0–0.2)

## 2019-09-19 LAB — PROTIME-INR
INR: 3.6 — ABNORMAL HIGH (ref 0.8–1.2)
Prothrombin Time: 35.3 seconds — ABNORMAL HIGH (ref 11.4–15.2)

## 2019-09-19 SURGERY — ECHOCARDIOGRAM, TRANSESOPHAGEAL
Anesthesia: Moderate Sedation

## 2019-09-19 NOTE — Progress Notes (Signed)
PROGRESS NOTE    Drelyn Pistilli   EVO:[id number]  DOB: 1932/04/09  DOA: 09/12/2019 PCP: Crist Infante, MD   Brief Narrative:  Kendyl Bissonnette 85 year old male with prior h/o parkinson's disease, MVR , h/o endocarditis, MV angioplasty in 2008. PAF on coumadin, TIA, hyperlipidemia, presents after a fall and was found to have acute displaced left intertrochanteric fracture. Cardiology consulted for pre op clearance.  Orthopedics was consulted and he underwent s/p intramedullary nail placement on 09/14/19.     PT/OT evaluation recommending CIR. Consult placed and waiting to hear from CIR for discharge.     Subjective: No complaints    Assessment & Plan:   Principal Problem:   Displaced intertrochanteric fracture of left femur, initial encounter for closed fracture  -Status post cephalomedullary nailing on 10/23 -Coumadin being managed by pharmacy-INR 3.6 today  Active Problems:   Parkinson's disease ([place]) -Continue Sinemet, selegiline and amantadine  Laceration on left ear related to fall -Sutured and is clean  Chronic systolic heart failure EF is 45 to 50% per echo on 08/30/2019, patient also has moderate reduction of RV function -Continue Lasix  Mitral valve replacement  Dementia -Continue Aricept  Mild chronic thrombocytopenia -Stable  Hypertension -Continue Toprol-we will place holding parameters  Time spent in minutes: 35 DVT prophylaxis: Coumadin Code Status: Full code Family Communication:  Disposition Plan: To be determined Consultants:   Ortho   Cardiology Procedures:   IM nail Antimicrobials:  Anti-infectives (From admission, onward)   Start     Dose/Rate Route Frequency Ordered Stop   09/14/19 1930  ceFAZolin (ANCEF) IVPB 2g/100 mL premix     2 g 200 mL/hr over 30 Minutes Intravenous Every 8 hours 09/14/19 1414 09/15/19 1533   09/14/19 1210  vancomycin (VANCOCIN) powder  Status:  Discontinued       As needed 09/14/19 1145 09/14/19 1255   09/14/19 0600   ceFAZolin (ANCEF) IVPB 2g/100 mL premix     2 g 200 mL/hr over 30 Minutes Intravenous To Short Stay 09/14/19 0454 09/14/19 1120       Objective: Vitals:   09/18/19 1436 09/18/19 2000 09/19/19 0543 09/19/19 0920  BP: 110/83 104/84 125/71 102/70  Pulse: 87 91 92 88  Resp: 18 18 18 17   Temp: 97.6 F (36.4 C) 97.7 F (36.5 C) 98.3 F (36.8 C) 98.5 F (36.9 C)  TempSrc: Oral Oral Oral Oral  SpO2: 98%  96% 98%  Weight:      Height:        Intake/Output Summary (Last 24 hours) at 09/19/2019 1651 Last data filed at 09/19/2019 2993 Gross per 24 hour  Intake 480 ml  Output -  Net 480 ml   Filed Weights   09/14/19 1010  Weight: 77 kg    Examination: General exam: Appears comfortable  HEENT: PERRLA, oral mucosa moist, no sclera icterus or thrush Respiratory system: Clear to auscultation. Respiratory effort normal. Cardiovascular system: S1 & S2 heard, RRR.  6 systolic murmur noted Gastrointestinal system: Abdomen soft, non-tender, nondistended. Normal bowel sounds. Central nervous system: Alert . No focal neurological deficits. Extremities: No cyanosis, clubbing or edema Skin: No rashes or ulcers Psychiatry:  Mood & affect appropriate.     Data Reviewed: I have personally reviewed following labs and imaging studies  CBC: Recent Labs  Lab 09/15/19 0405 09/16/19 0521 09/17/19 0410 09/18/19 0331 09/19/19 0210  WBC 5.9 6.2 5.4 5.9 5.5  HGB 14.5 13.6 13.1 13.0 13.2  HCT 43.3 41.3 39.4 38.8* 39.6  MCV  97.5 97.2 96.3 95.6 95.9  PLT 103* 111* 114* 124* 146*   Basic Metabolic Panel: Recent Labs  Lab 09/12/19 1925 09/14/19 1432 09/17/19 0410  NA 139 142 139  K 4.0 3.5 3.6  CL 103 106 106  CO2 27 26 22   GLUCOSE 139* 123* 107*  BUN 26* 23 27*  CREATININE 1.20 1.04 0.99  CALCIUM 9.0 8.2* 8.4*   GFR: Estimated Creatinine Clearance: 52.8 mL/min (by C-G formula based on SCr of 0.99 mg/dL). Liver Function Tests: No results for input(s): AST, ALT, ALKPHOS,  BILITOT, PROT, ALBUMIN in the last 168 hours. No results for input(s): LIPASE, AMYLASE in the last 168 hours. No results for input(s): AMMONIA in the last 168 hours. Coagulation Profile: Recent Labs  Lab 09/14/19 0245 09/16/19 0521 09/17/19 0410 09/18/19 0331 09/19/19 0210  INR 1.5* 1.8* 2.2* 2.8* 3.6*   Cardiac Enzymes: No results for input(s): CKTOTAL, CKMB, CKMBINDEX, TROPONINI in the last 168 hours. BNP (last 3 results) No results for input(s): PROBNP in the last 8760 hours. HbA1C: No results for input(s): HGBA1C in the last 72 hours. CBG: No results for input(s): GLUCAP in the last 168 hours. Lipid Profile: No results for input(s): CHOL, HDL, LDLCALC, TRIG, CHOLHDL, LDLDIRECT in the last 72 hours. Thyroid Function Tests: No results for input(s): TSH, T4TOTAL, FREET4, T3FREE, THYROIDAB in the last 72 hours. Anemia Panel: No results for input(s): VITAMINB12, FOLATE, FERRITIN, TIBC, IRON, RETICCTPCT in the last 72 hours. Urine analysis:    Component Value Date/Time   COLORURINE AMBER (A) 09/16/2019 2107   APPEARANCEUR CLEAR 09/16/2019 2107   LABSPEC 1.035 (H) 09/16/2019 2107   PHURINE 5.0 09/16/2019 2107   GLUCOSEU NEGATIVE 09/16/2019 2107   HGBUR NEGATIVE 09/16/2019 2107   BILIRUBINUR SMALL (A) 09/16/2019 2107   KETONESUR 5 (A) 09/16/2019 2107   PROTEINUR 30 (A) 09/16/2019 2107   NITRITE NEGATIVE 09/16/2019 2107   LEUKOCYTESUR NEGATIVE 09/16/2019 2107   Sepsis Labs: @LABRCNTIP (procalcitonin:4,lacticidven:4) ) Recent Results (from the past 240 hour(s))  SARS CORONAVIRUS 2 (TAT 6-24 HRS) Nasopharyngeal Nasopharyngeal Swab     Status: None   Collection Time: 09/12/19  8:44 PM   Specimen: Nasopharyngeal Swab  Result Value Ref Range Status   SARS Coronavirus 2 NEGATIVE NEGATIVE Final    Comment: (NOTE) SARS-CoV-2 target nucleic acids are NOT DETECTED. The SARS-CoV-2 RNA is generally detectable in upper and lower respiratory specimens during the acute phase of  infection. Negative results do not preclude SARS-CoV-2 infection, do not rule out co-infections with other pathogens, and should not be used as the sole basis for treatment or other patient management decisions. Negative results must be combined with clinical observations, patient history, and epidemiological information. The expected result is Negative. Fact Sheet for Patients: Fact Sheet for Healthcare Providers: 09/14/19 This test is not yet approved or cleared by the [place] FDA and  has been authorized for detection and/or diagnosis of SARS-CoV-2 by FDA under an Emergency Use Authorization (EUA). This EUA will remain  in effect (meaning this test can be used) for the duration of the COVID-19 declaration under Section 56 4(b)(1) of the Act, 21 U.S.C. section 360bbb-3(b)(1), unless the authorization is terminated or revoked sooner. Performed at [facility], [address], [place], [address] [place]   MRSA PCR Screening     Status: None   Collection Time: 09/14/19 12:55 AM   Specimen: Nasal Mucosa; Nasopharyngeal  Result Value Ref Range Status   MRSA by PCR NEGATIVE NEGATIVE Final  Comment:        The GeneXpert MRSA Assay (FDA approved for NASAL specimens only), is one component of a comprehensive MRSA colonization surveillance program. It is not intended to diagnose MRSA infection nor to guide or monitor treatment for MRSA infections. Performed at [facility], [address] [place], [place] [address]          Radiology Studies: No results found.    Scheduled Meds: . acetaminophen  650 mg Oral Q6H  . amantadine  100 mg Oral BID  . atorvastatin  20 mg Oral QPM  . carbidopa-levodopa  3 tablet Oral 2 times per day   And  . carbidopa-levodopa  2 tablet Oral q1800   And  . carbidopa-levodopa  1 tablet Oral QHS  . donepezil  10 mg Oral QHS  . escitalopram  10 mg  Oral Daily  . furosemide  20 mg Oral Daily  . metoprolol succinate  12.5 mg Oral Daily  . selegiline  5 mg Oral QAC breakfast   And  . selegiline  2.5 mg Oral QAC supper  . Warfarin - Pharmacist Dosing Inpatient   Does not apply q1800   Continuous Infusions: . lactated ringers Stopped (09/14/19 1300)     LOS: 6 days      Calvert CantorSaima Leighla Chestnutt, MD [facility] Pager: [email or web address] Password [email or web address] 09/19/2019, 4:51 PM

## 2019-09-19 NOTE — Progress Notes (Signed)
ANTICOAGULATION CONSULT NOTE - Follow Up Consult  Pharmacy Consult for Warfarin Indication: atrial fibrillation  Allergies  Allergen Reactions  . Apixaban Palpitations, Rash and Other (See Comments)    Worsening tardive dyskinesia and rapid heart rate  . Ace Inhibitors Cough  . Demerol [Meperidine] Other (See Comments)    Parkinsons disease  . Poison Ivy Extract Itching, Swelling and Rash    Patient Measurements: Height: 5\' 8"  (172.7 cm) Weight: 169 lb 12.8 oz (77 kg) IBW/kg (Calculated) : 68.4  Vital Signs: Temp: 98.3 F (36.8 C) (10/28 0543) Temp Source: Oral (10/28 0543) BP: 125/71 (10/28 0543) Pulse Rate: 92 (10/28 0543)  Labs: Recent Labs    09/16/19 1458  09/17/19 0410 09/18/19 0331 09/19/19 0210  HGB  --    < > 13.1 13.0 13.2  HCT  --   --  39.4 38.8* 39.6  PLT  --   --  114* 124* 146*  LABPROT  --   --  24.2* 29.3* 35.3*  INR  --   --  2.2* 2.8* 3.6*  HEPARINUNFRC 0.65  --   --   --   --   CREATININE  --   --  0.99  --   --    < > = values in this interval not displayed.    Estimated Creatinine Clearance: 52.8 mL/min (by C-G formula based on SCr of 0.99 mg/dL).  Assessment: 85 year old male on warfarin prior to admission for Afib, now s/p hip surgery. Patient was reversed with vitamin K 10mg  IV x 1 on 10/22, and heparin and warfarin started post-op on 10/24.   INR up to 3.6 today  HgB stable  Goal of Therapy:  INR 2-3 Monitor platelets by anticoagulation protocol: Yes   Plan:  -Hold warfarin tonight -Daily INR -Watch CBC   Thank you Anette Guarneri, PharmD  Please utilize Amion for appropriate phone number to reach the unit pharmacist ([place])

## 2019-09-19 NOTE — Plan of Care (Signed)

## 2019-09-19 NOTE — Progress Notes (Signed)
Physical Therapy Treatment Patient Details Name: [name] [name] MRN: [id number] DOB: 03-13-1932 Today's Date: 09/19/2019    History of Present Illness Pt is an 85 yr old male who had a fall and s/p Cephalomedullary nailing of left intertrochanteric femur fracture. PMH but not limited to: dyslipidemia, parkinson's s/p mitral valve replacement, stroke     PT Comments    Pt seen for mobility progression. Focus of session was on transfer training, standing balance and LE strengthening. Pt's spouse present throughout session, engaged and encouraging. Pt also highly motivated to move and improve. Pt would continue to benefit from skilled physical therapy services at this time while admitted and after d/c to address the below listed limitations in order to improve overall safety and independence with functional mobility.    Follow Up Recommendations  CIR;Supervision/Assistance - 24 hour     Equipment Recommendations  Other (comment)(defer to next venue of care)    Recommendations for Other Services       Precautions / Restrictions Precautions Precautions: Fall Restrictions Weight Bearing Restrictions: Yes LLE Weight Bearing: Weight bearing as tolerated    Mobility  Bed Mobility               General bed mobility comments: pt OOB in recliner chair upon arrival  Transfers Overall transfer level: Needs assistance Equipment used: Rolling walker (2 wheeled) Transfers: Sit to/from Stand Sit to Stand: Max assist;+2 physical assistance;Mod assist         General transfer comment: increased time and effort, verbal and tactile cueing for safe hand placement and technique; initial attempt pt with significant posterior lean, requiring max A x2 to achieve full upright standing; second attempt pt with improved anterior weight shift and better technique (after PT provided instruction and demonstration)  Ambulation/Gait             General Gait Details: pt able to take steps in place  with min A x2 for balance; deferred further gait training as pt requesting a sitting rest break "I need a breather"   Stairs             Wheelchair Mobility    Modified Rankin (Stroke Patients Only)       Balance Overall balance assessment: Needs assistance Sitting-balance support: Feet supported Sitting balance-Leahy Scale: Fair     Standing balance support: Bilateral upper extremity supported Standing balance-Leahy Scale: Poor Standing balance comment: reliant on bilateral UEs and external physical assistance                            Cognition Arousal/Alertness: Awake/alert Behavior During Therapy: WFL for tasks assessed/performed Overall Cognitive Status: History of cognitive impairments - at baseline Area of Impairment: Memory;Following commands;Safety/judgement;Problem solving                     Memory: Decreased recall of precautions;Decreased short-term memory Following Commands: Follows one step commands with increased time Safety/Judgement: Decreased awareness of deficits;Decreased awareness of safety   Problem Solving: Slow processing;Decreased initiation;Difficulty sequencing;Requires verbal cues;Requires tactile cues        Exercises General Exercises - Lower Extremity Ankle Circles/Pumps: AROM;Both;10 reps;Seated Long Arc Quad: AROM;Strengthening;Both;20 reps;Seated Hip Flexion/Marching: Seated;AROM;Strengthening;Both;10 reps Other Exercises Other Exercises: standing balance activity performed with bilateral UE supports on RW; lateral weight shifting with min A to close min guard    General Comments        Pertinent Vitals/Pain Pain Assessment: Faces Faces Pain Scale: Hurts a  little bit Pain Location: LLE with movement Pain Descriptors / Indicators: Guarding Pain Intervention(s): Monitored during session    Home Living                      Prior Function            PT Goals (current goals can now be  found in the care plan section) Acute Rehab PT Goals PT Goal Formulation: With patient/family Time For Goal Achievement: 09/29/19 Potential to Achieve Goals: Good Progress towards PT goals: Progressing toward goals    Frequency    Min 5X/week      PT Plan Current plan remains appropriate    Co-evaluation              AM-PAC PT "6 Clicks" Mobility   Outcome Measure  Help needed turning from your back to your side while in a flat bed without using bedrails?: A Lot Help needed moving from lying on your back to sitting on the side of a flat bed without using bedrails?: A Lot Help needed moving to and from a bed to a chair (including a wheelchair)?: A Lot Help needed standing up from a chair using your arms (e.g., wheelchair or bedside chair)?: A Lot Help needed to walk in hospital room?: A Lot Help needed climbing 3-5 steps with a railing? : Total 6 Click Score: 11    End of Session Equipment Utilized During Treatment: Gait belt Activity Tolerance: Patient tolerated treatment well Patient left: in chair;with call bell/phone within reach;with chair alarm set;with family/visitor present Nurse Communication: Mobility status PT Visit Diagnosis: Pain;Difficulty in walking, not elsewhere classified (R26.2);Unsteadiness on feet (R26.81) Pain - Right/Left: Left Pain - part of body: Hip     Time: 5176-1607 PT Time Calculation (min) (ACUTE ONLY): 19 min  Charges:  $Therapeutic Activity: 8-22 mins                     Deborah Chalk, PT, DPT  Acute Rehabilitation Services Pager [phone] Office [phone]     [name] [name] 09/19/2019, 2:21 PM

## 2019-09-19 NOTE — Progress Notes (Signed)
Inpatient Rehabilitation-Admissions Coordinator   Pt's insurance has requested a peer to peer for more information prior to making a determination regarding CIR. Dr. Wynelle Cleveland has agreed to complete. Peer to peer will take place tomorrow.   Jhonnie Garner, OTR/L  Rehab Admissions Coordinator  [phone] 09/19/2019 5:41 PM

## 2019-09-19 NOTE — TOC Initial Note (Signed)
Transition of Care [facility]) - Initial/Assessment Note    Patient Details  Name: Ewing Fandino MRN: [id number] Date of Birth: 03/10/1932  Transition of Care [facility]) CM/SW Contact:    Midge Minium RN, BSN, NCM-BC, ACM-RN [phone] (working remotely) Phone Number: 09/19/2019, 11:46 AM  Clinical Narrative:                 CM following for dispositional needs. Patient is an 85 yr old male who had a fall and s/p Cephalomedullary nailing of left intertrochanteric femur fracture.Patient lived at home with his spouse PTA, ambulated with DME with assistance provided by spouse. PT/OT eval completed with CIR recommended with the patient agreeable. Rehab [facility] following for appropriateness with insurance auth pending for CIR. CM team will continue to follow.   Expected Discharge Plan: IP Rehab Facility Barriers to Discharge: Insurance Authorization(awaiting insurance auth for [employer])   Patient Goals and CMS Choice Patient states their goals for this hospitalization and ongoing recovery are:: "to get stronger" CMS Medicare.gov Compare Post Acute Care list provided to:: Other (Comment Required)(Followed by [facility]) Choice offered to / list presented to : NA(Followed by Rehab [facility])  Expected Discharge Plan and Services Expected Discharge Plan: [place] In-house Referral: NA Discharge Planning Services: CM Consult Post Acute Care Choice: IP Rehab Living arrangements for the past 2 months: Single Family Home                  Prior Living Arrangements/Services Living arrangements for the past 2 months: Single Family Home Lives with:: Self, Spouse   Current home services: DME    Admission diagnosis:  MR (congenital mitral regurgitation) [Q23.3] Left hip pain [M25.552] Fall, initial encounter [W19.XXXA] Laceration of left ear, initial encounter [Q65.784O] Patient Active Problem List   Diagnosis Date Noted  . Displaced intertrochanteric fracture of left femur, initial encounter for closed fracture  ([place]) 09/13/2019  . Long term (current) use of anticoagulants 08/20/2019  . HLD (hyperlipidemia) 06/02/2015  . PD (Parkinson's disease) ([place]) 06/02/2015  . Mitral regurgitation 03/17/2015  . CVA (cerebral infarction)   . Slurred speech 03/05/2015  . Anxiety 12/19/2013  . Parkinson's disease ([place]) 08/06/2013  . Dyslipidemia 08/06/2013   PCP:  Crist Infante, MD Pharmacy:   [facility], [place] [address] Phone: [phone] Fax: [phone]     Social Determinants of Health (SDOH) Interventions    Readmission Risk Interventions No flowsheet data found.

## 2019-09-20 DIAGNOSIS — S72142A Displaced intertrochanteric fracture of left femur, initial encounter for closed fracture: Secondary | ICD-10-CM | POA: Diagnosis not present

## 2019-09-20 LAB — CBC
HCT: 40.8 % (ref 39.0–52.0)
Hemoglobin: 13.6 g/dL (ref 13.0–17.0)
MCH: 32.2 pg (ref 26.0–34.0)
MCHC: 33.3 g/dL (ref 30.0–36.0)
MCV: 96.5 fL (ref 80.0–100.0)
Platelets: 152 10*3/uL (ref 150–400)
RBC: 4.23 MIL/uL (ref 4.22–5.81)
RDW: 16.5 % — ABNORMAL HIGH (ref 11.5–15.5)
WBC: 5.1 10*3/uL (ref 4.0–10.5)
nRBC: 0 % (ref 0.0–0.2)

## 2019-09-20 LAB — PROTIME-INR
INR: 3 — ABNORMAL HIGH (ref 0.8–1.2)
Prothrombin Time: 30.5 seconds — ABNORMAL HIGH (ref 11.4–15.2)

## 2019-09-20 LAB — SARS CORONAVIRUS 2 (TAT 6-24 HRS): SARS Coronavirus 2: NEGATIVE

## 2019-09-20 MED ORDER — SELEGILINE HCL 5 MG PO TABS: 2.5000 mg | ORAL_TABLET | ORAL | Status: DC

## 2019-09-20 MED ORDER — ROTIGOTINE 4 MG/24HR TD PT24
1.0000 | MEDICATED_PATCH | Freq: Every day | TRANSDERMAL | Status: DC
  Administered 2019-09-20 – 2019-09-21 (×2): 1 via TRANSDERMAL

## 2019-09-20 MED ORDER — WARFARIN 0.5 MG HALF TABLET
0.5000 mg | ORAL_TABLET | Freq: Once | ORAL | Status: AC
  Administered 2019-09-20: 0.5 mg via ORAL
  Filled 2019-09-20: qty 1

## 2019-09-20 NOTE — Progress Notes (Signed)
   09/20/19 1613  SNF Authorization Status  SNF Authorization Type Transition of Care CM/SW Authorization Request  SNF Auth Started 09/20/19  SNF Auth Start Time [phone]  SNF Authorization Status Started   Insurance auth for ST SNF initiated with [employer].  Midge Minium RN, BSN, NCM-BC, ACM-RN [phone]

## 2019-09-20 NOTE — NC FL2 (Signed)
[place] LEVEL OF CARE SCREENING TOOL     IDENTIFICATION  Patient Name: [name] [name] Birthdate: [birth year] Sex: male Admission Date (Current Location): 09/12/2019  [facility] and [place] Number:  Herbalist and Address:  [facility] [place], [place] [address], [place], [place] [address]      Provider Number: [id number]  Attending Physician Name and Address:  Debbe Odea, MD  Relative Name and Phone Number:  Rufus Beske [phone]    Current Level of Care: Hospital Recommended Level of Care: [place] Prior Approval Number:    Date Approved/Denied:   PASRR Number: [id number]  Discharge Plan: SNF    Current Diagnoses: Patient Active Problem List   Diagnosis Date Noted  . Displaced intertrochanteric fracture of left femur, initial encounter for closed fracture ([place]) 09/13/2019  . Long term (current) use of anticoagulants 08/20/2019  . HLD (hyperlipidemia) 06/02/2015  . PD (Parkinson's disease) ([place]) 06/02/2015  . Mitral regurgitation 03/17/2015  . CVA (cerebral infarction)   . Slurred speech 03/05/2015  . Anxiety 12/19/2013  . Parkinson's disease ([place]) 08/06/2013  . Dyslipidemia 08/06/2013    Orientation RESPIRATION BLADDER Height & Weight     Self, Time, Situation, Place  Normal Incontinent Weight: 77 kg Height:  5\' 8"  (172.7 cm)  BEHAVIORAL SYMPTOMS/MOOD NEUROLOGICAL BOWEL NUTRITION STATUS  Other (Comment)(N/A) (N/A) Continent Diet(Heart Healthy)  AMBULATORY STATUS COMMUNICATION OF NEEDS Skin   Extensive Assist Verbally Surgical wounds(Left hip)                       Personal Care Assistance Level of Assistance  Bathing, Feeding, Dressing Bathing Assistance: Maximum assistance Feeding assistance: Maximum assistance Dressing Assistance: Limited assistance     Functional Limitations Info  Sight, Hearing, Speech Sight Info: Adequate Hearing Info: Adequate Speech Info: Adequate    SPECIAL CARE  FACTORS FREQUENCY  PT (By licensed PT), OT (By licensed OT)     PT Frequency: Min 4X/week OT Frequency: Min 2X/week            Contractures Contractures Info: Not present    Additional Factors Info  Code Status, Allergies Code Status Info: Full Code Allergies Info: Apixaban, Ace inhibitor, Demerol, poison ivy           Current Medications (09/20/2019):  This is the current hospital active medication list Current Facility-Administered Medications  Medication Dose Route Frequency Provider Last Rate Last Dose  . acetaminophen (TYLENOL) tablet 650 mg  650 mg Oral Q6H Patrecia Pace A, PA-C   650 mg at 09/20/19 1608  . amantadine (SYMMETREL) capsule 100 mg  100 mg Oral BID Patrecia Pace A, PA-C   100 mg at 09/20/19 1208  . atorvastatin (LIPITOR) tablet 20 mg  20 mg Oral QPM Patrecia Pace A, PA-C   20 mg at 09/19/19 1729  . bisacodyl (DULCOLAX) EC tablet 5 mg  5 mg Oral Daily PRN Patrecia Pace A, PA-C      . carbidopa-levodopa (SINEMET CR) 50-200 MG per tablet controlled release 3 tablet  3 tablet Oral 2 times per day Delray Alt, PA-C   3 tablet at 09/20/19 1214   And  . carbidopa-levodopa (SINEMET CR) 50-200 MG per tablet controlled release 2 tablet  2 tablet Oral q1800 Delray Alt, PA-C   2 tablet at 09/19/19 1728   And  . carbidopa-levodopa (SINEMET CR) 50-200 MG per tablet controlled release 1 tablet  1 tablet Oral QHS Delray Alt, PA-C  1 tablet at 09/19/19 2201  . donepezil (ARICEPT) tablet 10 mg  10 mg Oral QHS Ulyses Southward A, PA-C   10 mg at 09/19/19 2201  . escitalopram (LEXAPRO) tablet 10 mg  10 mg Oral Daily Ulyses Southward A, PA-C   10 mg at 09/20/19 1011  . furosemide (LASIX) tablet 20 mg  20 mg Oral Daily Kathlen Mody, MD   20 mg at 09/20/19 1011  . HYDROcodone-acetaminophen (NORCO/VICODIN) 5-325 MG per tablet 1 tablet  1 tablet Oral Q6H PRN Despina Hidden, PA-C   1 tablet at 09/16/19 0128  . lactated ringers infusion   Intravenous Continuous Despina Hidden, PA-C   Stopped at 09/14/19 1300  . methocarbamol (ROBAXIN) tablet 500 mg  500 mg Oral Q6H PRN Despina Hidden, PA-C   500 mg at 09/16/19 7322  . metoprolol succinate (TOPROL-XL) 24 hr tablet 12.5 mg  12.5 mg Oral Daily Rizwan, Ladell Heads, MD   12.5 mg at 09/20/19 1011  . morphine 2 MG/ML injection 0.5-1 mg  0.5-1 mg Intravenous Q2H PRN Despina Hidden, PA-C      . rotigotine (NEUPRO) 4 MG/24HR 1 patch  1 patch Transdermal Daily Rizwan, Ladell Heads, MD   1 patch at 09/20/19 1210  . selegiline (ELDEPRYL) tablet 5 mg  5 mg Oral QAC breakfast Ulyses Southward A, PA-C   5 mg at 09/20/19 1010   And  . selegiline (ELDEPRYL) tablet 2.5 mg  2.5 mg Oral QAC supper Ulyses Southward A, PA-C   2.5 mg at 09/20/19 1609  . warfarin (COUMADIN) tablet 0.5 mg  0.5 mg Oral ONCE-1800 Pierce, Dwayne A, RPH      . Warfarin - Pharmacist Dosing Inpatient   Does not apply q1800 Almon Hercules, [facility]   Stopped at 09/19/19 1800     Discharge Medications: Please see discharge summary for a list of discharge medications.  Relevant Imaging Results:  Relevant Lab Results:   Additional Information SSN: [id number]  Colleen Can RN, BSN, NCM-BC, ACM-RN [phone]

## 2019-09-20 NOTE — Plan of Care (Signed)

## 2019-09-20 NOTE — Progress Notes (Signed)
PROGRESS NOTE    [name] [name]   QMV:[id number]  DOB: 10/18/1932  DOA: 09/12/2019 PCP: Crist Infante, MD   Brief Narrative:  [name] [name] 85 year old male with prior h/o parkinson's disease, MVR , h/o endocarditis, MV angioplasty in 2008. PAF on coumadin, TIA, hyperlipidemia, presents after a fall and was found to have acute displaced left intertrochanteric fracture. Cardiology consulted for pre op clearance.  Orthopedics was consulted and he underwent s/p intramedullary nail placement on 09/14/19.     PT/OT evaluation recommending CIR. Consult placed and waiting to hear from CIR for discharge.     Subjective: No complaints.     Assessment & Plan:   Principal Problem:   Displaced intertrochanteric fracture of left femur, initial encounter for closed fracture  -Status post cephalomedullary nailing on 10/23 -Coumadin being managed by pharmacy-INR 3.0 today  Active Problems:   Parkinson's disease ([place]) -Continue Sinemet, selegiline and amantadine  Laceration on left ear related to fall -Sutured and is clean  Chronic systolic heart failure EF is 45 to 50% per echo on 08/30/2019, patient also has moderate reduction of RV function -Continue Lasix  PAF - cont Toprol and Coumadin  Mitral valve replacement  Dementia -Continue Aricept  Mild chronic thrombocytopenia -Stable  Hypertension -Continue Toprol-we will place holding parameters  Time spent in minutes: 35 DVT prophylaxis: Coumadin Code Status: Full code Family Communication:  Disposition Plan: To be determined Consultants:   Ortho   Cardiology Procedures:   IM nail Antimicrobials:  Anti-infectives (From admission, onward)   Start     Dose/Rate Route Frequency Ordered Stop   09/14/19 1930  ceFAZolin (ANCEF) IVPB 2g/100 mL premix     2 g 200 mL/hr over 30 Minutes Intravenous Every 8 hours 09/14/19 1414 09/15/19 1533   09/14/19 1210  vancomycin (VANCOCIN) powder  Status:  Discontinued       As needed 09/14/19  1145 09/14/19 1255   09/14/19 0600  ceFAZolin (ANCEF) IVPB 2g/100 mL premix     2 g 200 mL/hr over 30 Minutes Intravenous To Short Stay 09/14/19 0454 09/14/19 1120       Objective: Vitals:   09/19/19 1630 09/19/19 1937 09/20/19 0429 09/20/19 1414  BP: 120/79 127/80 117/78 104/84  Pulse: 76 95 81 88  Resp: 17 18 20    Temp: (!) 97.5 F (36.4 C) 98.5 F (36.9 C) (!) 97.5 F (36.4 C) 97.7 F (36.5 C)  TempSrc: Oral Oral Oral Oral  SpO2: 98% (!) 81% 94% 93%  Weight:      Height:        Intake/Output Summary (Last 24 hours) at 09/20/2019 1713 Last data filed at 09/19/2019 2000 Gross per 24 hour  Intake -  Output 1 ml  Net -1 ml   Filed Weights   09/14/19 1010  Weight: 77 kg    Examination: General exam: Appears comfortable  HEENT: PERRLA, oral mucosa moist, no sclera icterus or thrush Respiratory system: Clear to auscultation. Respiratory effort normal. Cardiovascular system: S1 & S2 heard,  2/6 murmur Gastrointestinal system: Abdomen soft, non-tender, nondistended. Normal bowel sounds   Central nervous system: Alert and oriented. No focal neurological deficits. Extremities: No cyanosis, clubbing or edema Skin: No rashes or ulcers Psychiatry:  Mood & affect appropriate.     Data Reviewed: I have personally reviewed following labs and imaging studies  CBC: Recent Labs  Lab 09/16/19 0521 09/17/19 0410 09/18/19 0331 09/19/19 0210 09/20/19 0438  WBC 6.2 5.4 5.9 5.5 5.1  HGB 13.6 13.1 13.0  13.2 13.6  HCT 41.3 39.4 38.8* 39.6 40.8  MCV 97.2 96.3 95.6 95.9 96.5  PLT 111* 114* 124* 146* 152   Basic Metabolic Panel: Recent Labs  Lab 09/14/19 1432 09/17/19 0410  NA 142 139  K 3.5 3.6  CL 106 106  CO2 26 22  GLUCOSE 123* 107*  BUN 23 27*  CREATININE 1.04 0.99  CALCIUM 8.2* 8.4*   GFR: Estimated Creatinine Clearance: 52.8 mL/min (by C-G formula based on SCr of 0.99 mg/dL). Liver Function Tests: No results for input(s): AST, ALT, ALKPHOS, BILITOT, PROT,  ALBUMIN in the last 168 hours. No results for input(s): LIPASE, AMYLASE in the last 168 hours. No results for input(s): AMMONIA in the last 168 hours. Coagulation Profile: Recent Labs  Lab 09/16/19 0521 09/17/19 0410 09/18/19 0331 09/19/19 0210 09/20/19 0438  INR 1.8* 2.2* 2.8* 3.6* 3.0*   Cardiac Enzymes: No results for input(s): CKTOTAL, CKMB, CKMBINDEX, TROPONINI in the last 168 hours. BNP (last 3 results) No results for input(s): PROBNP in the last 8760 hours. HbA1C: No results for input(s): HGBA1C in the last 72 hours. CBG: No results for input(s): GLUCAP in the last 168 hours. Lipid Profile: No results for input(s): CHOL, HDL, LDLCALC, TRIG, CHOLHDL, LDLDIRECT in the last 72 hours. Thyroid Function Tests: No results for input(s): TSH, T4TOTAL, FREET4, T3FREE, THYROIDAB in the last 72 hours. Anemia Panel: No results for input(s): VITAMINB12, FOLATE, FERRITIN, TIBC, IRON, RETICCTPCT in the last 72 hours. Urine analysis:    Component Value Date/Time   COLORURINE AMBER (A) 09/16/2019 2107   APPEARANCEUR CLEAR 09/16/2019 2107   LABSPEC 1.035 (H) 09/16/2019 2107   PHURINE 5.0 09/16/2019 2107   GLUCOSEU NEGATIVE 09/16/2019 2107   HGBUR NEGATIVE 09/16/2019 2107   BILIRUBINUR SMALL (A) 09/16/2019 2107   KETONESUR 5 (A) 09/16/2019 2107   PROTEINUR 30 (A) 09/16/2019 2107   NITRITE NEGATIVE 09/16/2019 2107   LEUKOCYTESUR NEGATIVE 09/16/2019 2107   Sepsis Labs: @LABRCNTIP (procalcitonin:4,lacticidven:4) ) Recent Results (from the past 240 hour(s))  SARS CORONAVIRUS 2 (TAT 6-24 HRS) Nasopharyngeal Nasopharyngeal Swab     Status: None   Collection Time: 09/12/19  8:44 PM   Specimen: Nasopharyngeal Swab  Result Value Ref Range Status   SARS Coronavirus 2 NEGATIVE NEGATIVE Final    Comment: (NOTE) SARS-CoV-2 target nucleic acids are NOT DETECTED. The SARS-CoV-2 RNA is generally detectable in upper and lower respiratory specimens during the acute phase of infection. Negative  results do not preclude SARS-CoV-2 infection, do not rule out co-infections with other pathogens, and should not be used as the sole basis for treatment or other patient management decisions. Negative results must be combined with clinical observations, patient history, and epidemiological information. The expected result is Negative. Fact Sheet for Patients: 09/14/19 Fact Sheet for Healthcare Providers: HairSlick.no This test is not yet approved or cleared by the quierodirigir.com FDA and  has been authorized for detection and/or diagnosis of SARS-CoV-2 by FDA under an Emergency Use Authorization (EUA). This EUA will remain  in effect (meaning this test can be used) for the duration of the COVID-19 declaration under Section 56 4(b)(1) of the Act, 21 U.S.C. section 360bbb-3(b)(1), unless the authorization is terminated or revoked sooner. Performed at [facility], [address] [place], [place]   MRSA PCR Screening     Status: None   Collection Time: 09/14/19 12:55 AM   Specimen: Nasal Mucosa; Nasopharyngeal  Result Value Ref Range Status   MRSA by PCR NEGATIVE NEGATIVE Final  Comment:        The GeneXpert MRSA Assay (FDA approved for NASAL specimens only), is one component of a comprehensive MRSA colonization surveillance program. It is not intended to diagnose MRSA infection nor to guide or monitor treatment for MRSA infections. Performed at [facility], [address] [place], [place] [address]          Radiology Studies: No results found.    Scheduled Meds: . acetaminophen  650 mg Oral Q6H  . amantadine  100 mg Oral BID  . atorvastatin  20 mg Oral QPM  . carbidopa-levodopa  3 tablet Oral 2 times per day   And  . carbidopa-levodopa  2 tablet Oral q1800   And  . carbidopa-levodopa  1 tablet Oral QHS  . donepezil  10 mg Oral QHS  . escitalopram  10 mg Oral Daily  .  furosemide  20 mg Oral Daily  . metoprolol succinate  12.5 mg Oral Daily  . rotigotine  1 patch Transdermal Daily  . selegiline  5 mg Oral QAC breakfast   And  . selegiline  2.5 mg Oral QAC supper  . warfarin  0.5 mg Oral ONCE-1800  . Warfarin - Pharmacist Dosing Inpatient   Does not apply q1800   Continuous Infusions: . lactated ringers Stopped (09/14/19 1300)     LOS: 7 days      Calvert CantorSaima Tremeka Helbling, MD [facility] Pager: [email or web address] Password [email or web address] 09/20/2019, 5:13 PM

## 2019-09-20 NOTE — Progress Notes (Signed)
ANTICOAGULATION CONSULT NOTE - Follow Up Consult  Pharmacy Consult for Warfarin Indication: atrial fibrillation  Allergies  Allergen Reactions  . Apixaban Palpitations, Rash and Other (See Comments)    Worsening tardive dyskinesia and rapid heart rate  . Ace Inhibitors Cough  . Demerol [Meperidine] Other (See Comments)    Parkinsons disease  . Poison Ivy Extract Itching, Swelling and Rash    Patient Measurements: Height: 5\' 8"  (172.7 cm) Weight: 169 lb 12.8 oz (77 kg) IBW/kg (Calculated) : 68.4  Vital Signs: Temp: 97.5 F (36.4 C) (10/29 0429) Temp Source: Oral (10/29 0429) BP: 117/78 (10/29 0429) Pulse Rate: 81 (10/29 0429)  Labs: Recent Labs    09/18/19 0331 09/19/19 0210 09/20/19 0438  HGB 13.0 13.2 13.6  HCT 38.8* 39.6 40.8  PLT 124* 146* 152  LABPROT 29.3* 35.3* 30.5*  INR 2.8* 3.6* 3.0*    Estimated Creatinine Clearance: 52.8 mL/min (by C-G formula based on SCr of 0.99 mg/dL).  Assessment: 85 year old male on warfarin prior to admission for Afib, now s/p hip surgery. Patient was reversed with vitamin K 10mg  IV x 1 on 10/22, and heparin and warfarin started post-op on 10/24.   INR down to 3 today  Hemoglobin stable  Goal of Therapy:  INR 2-3 Monitor platelets by anticoagulation protocol: Yes   Plan:  -Warfarin 0.5mg  PO tonight -Daily INR -Watch CBC   Saturnino Liew A. Levada Dy, PharmD, BCPS, FNKF Clinical Pharmacist  Please utilize Amion for appropriate phone number to reach the unit pharmacist ([place])

## 2019-09-20 NOTE — TOC Progression Note (Signed)
Transition of Care [facility]) - Progression Note    Patient Details  Name: [name] [name] MRN: [id number] Date of Birth: 01-Jan-1932  Transition of Care [facility]) CM/SW [place] RN, BSN, NCM-BC, ACM-RN [phone] (working remotely) Phone Number: 09/20/2019, 4:23 PM  Clinical Narrative:    Patients insurance company has denied CIR request; spouse has requested to discuss DC options with CM. CM attempted to reach the patient via phone with no answer. CM spoke to the patients spouse, with spouse interested in [facility] SNF for rehab. CMS SNF compare options discussed with [facility], [facility] preferred; open to referral being sent to [facility]. FL2/PASRR completed and faxed to [facility]; insurance auth initiated. CM team will continue to follow.    Expected Discharge Plan: Skilled Nursing Facility Barriers to Discharge: Insurance Authorization(awaiting insurance auth for [employer])  Expected Discharge Plan and Services Expected Discharge Plan: [place] In-house Referral: NA Discharge Planning Services: CM Consult Post Acute Care Choice: [place] Living arrangements for the past 2 months: Single Family Home                   Social Determinants of Health (SDOH) Interventions    Readmission Risk Interventions No flowsheet data found.

## 2019-09-20 NOTE — Progress Notes (Signed)
Physical Therapy Treatment Patient Details Name: [name] [name] MRN: [id number] DOB: Jun 03, 1932 Today's Date: 09/20/2019    History of Present Illness Pt is an 85 yr old male who had a fall and s/p Cephalomedullary nailing of left intertrochanteric femur fracture. PMH but not limited to: dyslipidemia, parkinson's s/p mitral valve replacement, stroke     PT Comments    Pt making progress with mobility and tolerated short distance gait training this session with mod A and RW. He continues to require heavy physical assistance for all mobility at this time as well as multimodal cueing to complete tasks. Pt would continue to benefit from skilled physical therapy services at this time while admitted and after d/c to address the below listed limitations in order to improve overall safety and independence with functional mobility.    Follow Up Recommendations  CIR;Supervision/Assistance - 24 hour     Equipment Recommendations  Other (comment)(defer to next venue of care)    Recommendations for Other Services       Precautions / Restrictions Precautions Precautions: Fall Restrictions Weight Bearing Restrictions: Yes LLE Weight Bearing: Weight bearing as tolerated    Mobility  Bed Mobility Overal bed mobility: Needs Assistance Bed Mobility: Supine to Sit     Supine to sit: Max assist     General bed mobility comments: assistance with bilateral LEs off of bed and for trunk elevation; use of bed pads to position pt's hips  Transfers Overall transfer level: Needs assistance Equipment used: Rolling walker (2 wheeled) Transfers: Sit to/from Stand Sit to Stand: Mod assist;+2 physical assistance         General transfer comment: max cueing for technique and sequencing, multimodal cueing for hand placement, assistance needed to power into standing from EOB  Ambulation/Gait Ambulation/Gait assistance: Mod assist;+2 safety/equipment Gait Distance (Feet): 10 Feet Assistive device:  Rolling walker (2 wheeled) Gait Pattern/deviations: Step-to pattern;Decreased step length - right;Decreased step length - left;Decreased stride length;Shuffle;Ataxic;Festinating;Narrow base of support Gait velocity: decreased   General Gait Details: pt with very short step length bilaterally and very narrow BoS, sometimes stepping on his own feet; unable to correct much with cueing. Mod A with RW needed for balance and stability   Stairs             Wheelchair Mobility    Modified Rankin (Stroke Patients Only)       Balance Overall balance assessment: Needs assistance Sitting-balance support: Feet supported Sitting balance-Leahy Scale: Fair     Standing balance support: Bilateral upper extremity supported Standing balance-Leahy Scale: Poor Standing balance comment: reliant on bilateral UEs and external physical assistance                            Cognition Arousal/Alertness: Awake/alert Behavior During Therapy: WFL for tasks assessed/performed Overall Cognitive Status: History of cognitive impairments - at baseline Area of Impairment: Memory;Following commands;Safety/judgement;Problem solving                     Memory: Decreased recall of precautions;Decreased short-term memory Following Commands: Follows one step commands with increased time Safety/Judgement: Decreased awareness of deficits;Decreased awareness of safety   Problem Solving: Slow processing;Decreased initiation;Difficulty sequencing;Requires verbal cues;Requires tactile cues        Exercises      General Comments        Pertinent Vitals/Pain Pain Assessment: No/denies pain    Home Living  Prior Function            PT Goals (current goals can now be found in the care plan section) Acute Rehab PT Goals PT Goal Formulation: With patient/family Time For Goal Achievement: 09/29/19 Potential to Achieve Goals: Good Progress towards PT  goals: Progressing toward goals    Frequency    Min 4X/week      PT Plan Current plan remains appropriate    Co-evaluation              AM-PAC PT "6 Clicks" Mobility   Outcome Measure  Help needed turning from your back to your side while in a flat bed without using bedrails?: A Lot Help needed moving from lying on your back to sitting on the side of a flat bed without using bedrails?: A Lot Help needed moving to and from a bed to a chair (including a wheelchair)?: A Lot Help needed standing up from a chair using your arms (e.g., wheelchair or bedside chair)?: A Lot Help needed to walk in hospital room?: A Lot Help needed climbing 3-5 steps with a railing? : Total 6 Click Score: 11    End of Session Equipment Utilized During Treatment: Gait belt Activity Tolerance: Patient tolerated treatment well Patient left: in chair;with call bell/phone within reach;with chair alarm set Nurse Communication: Mobility status PT Visit Diagnosis: Other abnormalities of gait and mobility (R26.89)     Time: 1287-8676 PT Time Calculation (min) (ACUTE ONLY): 14 min  Charges:  $Gait Training: 8-22 mins                     Deborah Chalk, PT, DPT  Acute Rehabilitation Services Pager [phone] Office [phone]     Alessandra Bevels Lyndy Russman 09/20/2019, 11:21 AM

## 2019-09-20 NOTE — Progress Notes (Signed)
Inpatient Rehabilitation-Admissions Coordinator   [employer] company has denied his request for CIR. I have communicated the denial to the pt, his wife, MD, and CM. I provided the pt's wife with the appeals number. The pt's wife is interested in SNF at this time and would like more information regarding this process. Not sure if the patient will agree to SNF however.   AC will sign off.   Please call if questions.   Jhonnie Garner, OTR/L  Rehab Admissions Coordinator  [phone] 09/20/2019 3:57 PM

## 2019-09-21 DIAGNOSIS — F0390 Unspecified dementia without behavioral disturbance: Secondary | ICD-10-CM | POA: Diagnosis not present

## 2019-09-21 DIAGNOSIS — S01312A Laceration without foreign body of left ear, initial encounter: Secondary | ICD-10-CM | POA: Diagnosis not present

## 2019-09-21 DIAGNOSIS — Z9889 Other specified postprocedural states: Secondary | ICD-10-CM | POA: Diagnosis not present

## 2019-09-21 DIAGNOSIS — I5022 Chronic systolic (congestive) heart failure: Secondary | ICD-10-CM

## 2019-09-21 DIAGNOSIS — G459 Transient cerebral ischemic attack, unspecified: Secondary | ICD-10-CM

## 2019-09-21 DIAGNOSIS — S72142A Displaced intertrochanteric fracture of left femur, initial encounter for closed fracture: Secondary | ICD-10-CM | POA: Diagnosis not present

## 2019-09-21 DIAGNOSIS — I480 Paroxysmal atrial fibrillation: Secondary | ICD-10-CM

## 2019-09-21 DIAGNOSIS — Z7901 Long term (current) use of anticoagulants: Secondary | ICD-10-CM

## 2019-09-21 DIAGNOSIS — Q233 Congenital mitral insufficiency: Secondary | ICD-10-CM

## 2019-09-21 DIAGNOSIS — D696 Thrombocytopenia, unspecified: Secondary | ICD-10-CM

## 2019-09-21 LAB — PROTIME-INR
INR: 2.8 — ABNORMAL HIGH (ref 0.8–1.2)
Prothrombin Time: 29 seconds — ABNORMAL HIGH (ref 11.4–15.2)

## 2019-09-21 MED ORDER — WHITE PETROLATUM EX OINT
TOPICAL_OINTMENT | CUTANEOUS | Status: AC
  Administered 2019-09-21: 22:00:00
  Filled 2019-09-21: qty 28.35

## 2019-09-21 MED ORDER — HYDROCODONE-ACETAMINOPHEN 5-325 MG PO TABS: 1.0000 | ORAL_TABLET | Freq: Four times a day (QID) | ORAL | 0 refills | Status: DC | PRN

## 2019-09-21 MED ORDER — BISACODYL 5 MG PO TBEC: 5.0000 mg | DELAYED_RELEASE_TABLET | Freq: Every day | ORAL | 0 refills | Status: AC | PRN

## 2019-09-21 MED ORDER — WARFARIN SODIUM 2 MG PO TABS
2.0000 mg | ORAL_TABLET | Freq: Once | ORAL | Status: AC
  Administered 2019-09-21: 2 mg via ORAL
  Filled 2019-09-21: qty 1

## 2019-09-21 MED ORDER — FUROSEMIDE 20 MG PO TABS: 10.0000 mg | ORAL_TABLET | ORAL | Status: AC

## 2019-09-21 MED ORDER — ACETAMINOPHEN 325 MG PO TABS: 650.0000 mg | ORAL_TABLET | Freq: Four times a day (QID) | ORAL | Status: AC

## 2019-09-21 NOTE — Progress Notes (Signed)
Orthopaedic Trauma Progress Note  S: Doing well this morning, pain well controlled. States he has been pushing it some with therapy so he is sore. Patient's insurance denied CIR, case management team working on SNF placement now  O:  Vitals:   09/21/19 0447 09/21/19 0740  BP: 114/84 119/77  Pulse: (!) 104 98  Resp: 20 18  Temp: 98.6 F (37 C) (!) 97.4 F (36.3 C)  SpO2: (!) 88% 94%    General - Laying in bed, NAD. Pleasant  Left Lower Extremity - Incisions clean, dry, intact. Moderate ecchymosis over lateral hip and thigh. No significant swelling distally. Knee is non-tender. Extremity is warm.No deep calf tenderness. Compartments are soft and non-tender. Sensation grossly intact.+DP pulse  Imaging: Stable post op imaging.   Labs:  Results for orders placed or performed during the hospital encounter of 09/12/19 (from the past 24 hour(s))  SARS CORONAVIRUS 2 (TAT 6-24 HRS) Nasopharyngeal Nasopharyngeal Swab     Status: None   Collection Time: 09/20/19  4:52 PM   Specimen: Nasopharyngeal Swab  Result Value Ref Range   SARS Coronavirus 2 NEGATIVE NEGATIVE  Protime-INR     Status: Abnormal   Collection Time: 09/21/19  3:21 AM  Result Value Ref Range   Prothrombin Time 29.0 (H) 11.4 - 15.2 seconds   INR 2.8 (H) 0.8 - 1.2    Assessment: 85 year old male with history of A. fib on chronic anticoagulation along with history of mitral valve replacement and Parkinson's diseases/pfall with left hip fracture  Injuries: Left intertrochanteric femur fracture s/p IMN 09/14/19   Weightbearing: WBAT LLE with assistance  Insicional and dressing care: Okay to leave incisions oepn to air, change dressing as needed. Okay to clean with soap and water.   Orthopedic device(s): None   CV/Blood loss: Hgb stable  Pain management:  1. Tylenol 650 mg q 6 hours scheduled 2. Robaxin 500 mg q 6 hours PRN 3. Norco 5-325 mg q 6 hours PRN 4. Morphine 0.5-1 mg q 2 hours PRN  VTE prophylaxis:  Coumadin  ID:  Ancef 2gm post op completed  Foley/Lines: No foley, KVO IVFs  Dispo: Up with therapy. Awaiting insurance authorization for SNF. Okay for discharge from ortho standpoint once cleared by medicine team and therapies  Follow - up plan: 2 weeks  Contact information:  Katha Hamming MD, Patrecia Pace PA   Tysin Salada A. Carmie [facility] [phone] (office) [email or web address]

## 2019-09-21 NOTE — TOC Transition Note (Addendum)
Transition of Care [facility]) - CM/SW Discharge Note   Patient Details  Name: Masaichi Kracht MRN: [id number] Date of Birth: 04-Jun-1932  [facility]) CM/SW Contact:  Midge Minium RN, BSN, NCM-BC, ACM-RN [phone] (working remotely) Phone Number: 09/21/2019, 12:55 PM   Clinical Narrative:    Patient is medically stable to transition to [facility] SNF, per Dr. Reesa Chew. CM has updated the patients spouse, Colletta [place]; unable to reach the patient by phone; daughter is agreeable to transfer. PTAR will be arrange for 1430.  Please call report to: [phone]; assigned room: 7007   Final next level of care: Skilled Nursing Facility Barriers to Discharge: No Barriers Identified   Patient Goals and CMS Choice Patient states their goals for this hospitalization and ongoing recovery are:: "to get stronger" CMS Medicare.gov Compare Post Acute Care list provided to:: Patient Choice offered to / list presented to : Patient  Discharge Placement              Patient chooses bed at: [facility] Patient to be transferred to facility by: [facility] Name of family member notified: Colletta [place] (spouse) Patient and family notified of of transfer: 09/21/19  Discharge Plan and Services In-house Referral: NA Discharge Planning Services: CM Consult Post Acute Care Choice: [place]           Social Determinants of Health (SDOH) Interventions     Readmission Risk Interventions No flowsheet data found.

## 2019-09-21 NOTE — Progress Notes (Signed)
   09/21/19 1245  SNF Authorization Status  SNF Authorization Type Transition of Care CM/SW Authorization Request  SNF Authorization Complete 09/21/19  SNF Auth Complete Time 1245  SNF Authorization Status Complete   CM spoke to [employer] rep); insurance auth received for [facility]. Auth number: [id number].  Midge Minium RN, BSN, NCM-BC, ACM-RN [phone]

## 2019-09-21 NOTE — Care Management (Addendum)
Covering for NCM at 1430 . Patient to discharge to SNF today, however, no prescription for pain medication. Attepting to get in touch with [name] [name] chatted and called office.   Spoke to [place] at [facility] , she cannot take patient without pain medication prescription due to time of day she cannot accept patient today but can tomorrow.  Nurse Doroteo Bradford and Dr Wynelle Cleveland aware. Spoke to patient's wife [name] [name] [facility] RN

## 2019-09-21 NOTE — Care Management Important Message (Signed)
Important Message  Patient Details  Name: [name] [name] MRN: [id number] Date of Birth: Feb 09, 1932   Medicare Important Message Given:  Yes     Memory Argue 09/21/2019, 1:22 PM

## 2019-09-21 NOTE — Progress Notes (Addendum)
Physical Therapy Treatment Patient Details Name: [name] [name] MRN: [id number] DOB: 05-28-1932 Today's Date: 09/21/2019    History of Present Illness Pt is an 85 yr old male who had a fall and s/p Cephalomedullary nailing of left intertrochanteric femur fracture. PMH but not limited to: dyslipidemia, parkinson's s/p mitral valve replacement, stroke     PT Comments    Focus of PT session on transfer and gait training. Pt ambulated 10+5 ft in room with RW, requiring multimodal cuing for form and safety. Pt lacking safety awareness with RW and transfers, PT having to physically assist pt hand placement at times. Pt's wife present and assisting with chair follow this session, very supportive and engaged with pt. PT updated recommendation to reflect SNF level of care, pt not approved for CIR at this time.    Follow Up Recommendations  SNF;Supervision/Assistance - 24 hour     Equipment Recommendations  Other (comment)(defer to next venue of care)    Recommendations for Other Services       Precautions / Restrictions Precautions Precautions: Fall Restrictions Weight Bearing Restrictions: No LLE Weight Bearing: Weight bearing as tolerated    Mobility  Bed Mobility Overal bed mobility: Needs Assistance             General bed mobility comments: pt up in chair upon PT arrival to room, requesting staying in recliner for lunch upon PT exit.  Transfers Overall transfer level: Needs assistance Equipment used: Rolling walker (2 wheeled) Transfers: Sit to/from Stand Sit to Stand: Mod assist;+2 safety/equipment         General transfer comment: mod assist for power up from recliner, hand placement to rise effectively, and steadying upon standing. Pt with difficulty transitioning UEs to RW after standing from recliner/BSC. Sit to stand x3, twice from recliner and 1X from [facility].  Ambulation/Gait Ambulation/Gait assistance: Mod assist;+2 safety/equipment Gait Distance (Feet): 10  Feet(10+5, from recliner to bathroom, from bathroom to moved-closer recliner) Assistive device: Rolling walker (2 wheeled) Gait Pattern/deviations: Step-to pattern;Step-through pattern;Decreased stride length;Shuffle;Narrow base of support;Trunk flexed;Drifts right/left;Leaning posteriorly Gait velocity: decr   General Gait Details: Mod assist for steadying, guiding RW and pt, correcting pt posture as pt with heavy posterior leaning initially. Multimodal cuing for upright posture, sequencing with step-through gait (VC "big steps"), placement in RW, and controlled descent when reaching destination seating surface.   Stairs             Wheelchair Mobility    Modified Rankin (Stroke Patients Only)       Balance Overall balance assessment: Needs assistance Sitting-balance support: Feet supported Sitting balance-Leahy Scale: Fair Sitting balance - Comments: pt able to sit edge of chair with UE and LE support   Standing balance support: Bilateral upper extremity supported Standing balance-Leahy Scale: Poor Standing balance comment: reliant on bilateral UEs and external physical assistance to maintain upright                            Cognition Arousal/Alertness: Awake/alert Behavior During Therapy: WFL for tasks assessed/performed Overall Cognitive Status: History of cognitive impairments - at baseline Area of Impairment: Memory;Following commands;Safety/judgement;Problem solving                     Memory: Decreased recall of precautions;Decreased short-term memory Following Commands: Follows one step commands with increased time Safety/Judgement: Decreased awareness of deficits;Decreased awareness of safety   Problem Solving: Slow processing;Decreased initiation;Difficulty sequencing;Requires verbal cues;Requires tactile cues General  Comments: Pt requires max multimodal cuing for safety with mobility, reinforcement and rephrasing often necessary.       Exercises      General Comments        Pertinent Vitals/Pain Pain Assessment: Faces Faces Pain Scale: Hurts little more Pain Location: LLE with movement Pain Descriptors / Indicators: Guarding;Sore;Spasm Pain Intervention(s): Limited activity within patient's tolerance;Monitored during session;Repositioned    Home Living                      Prior Function            PT Goals (current goals can now be found in the care plan section) Acute Rehab PT Goals Patient Stated Goal: to get stronger PT Goal Formulation: With patient/family Time For Goal Achievement: 09/29/19 Potential to Achieve Goals: Good Progress towards PT goals: Progressing toward goals    Frequency    Min 4X/week      PT Plan Discharge plan needs to be updated    Co-evaluation              AM-PAC PT "6 Clicks" Mobility   Outcome Measure  Help needed turning from your back to your side while in a flat bed without using bedrails?: A Lot Help needed moving from lying on your back to sitting on the side of a flat bed without using bedrails?: A Lot Help needed moving to and from a bed to a chair (including a wheelchair)?: A Lot Help needed standing up from a chair using your arms (e.g., wheelchair or bedside chair)?: A Lot Help needed to walk in hospital room?: A Lot Help needed climbing 3-5 steps with a railing? : Total 6 Click Score: 11    End of Session Equipment Utilized During Treatment: Gait belt Activity Tolerance: Patient tolerated treatment well Patient left: in chair;with call bell/phone within reach;with chair alarm set;with family/visitor present(wife at bedside) Nurse Communication: Mobility status PT Visit Diagnosis: Other abnormalities of gait and mobility (R26.89);Pain Pain - Right/Left: Left Pain - part of body: Hip     Time: 3545-6256 PT Time Calculation (min) (ACUTE ONLY): 25 min  Charges:  $Gait Training: 8-22 mins $Therapeutic Activity: 8-22 mins                     Yannely Kintzel E, PT Acute Rehabilitation Services Pager [phone]  Office [phone]   Jaymes Hang D Aylen Stradford 09/21/2019, 1:26 PM

## 2019-09-21 NOTE — Progress Notes (Signed)
Attempted to give report to [place], RN @ [facility].  Took my name and number and stated will call me back.

## 2019-09-21 NOTE — Progress Notes (Signed)
ANTICOAGULATION CONSULT NOTE - Follow Up Consult  Pharmacy Consult for Warfarin Indication: atrial fibrillation  Allergies  Allergen Reactions  . Apixaban Palpitations, Rash and Other (See Comments)    Worsening tardive dyskinesia and rapid heart rate  . Ace Inhibitors Cough  . Demerol [Meperidine] Other (See Comments)    Parkinsons disease  . Poison Ivy Extract Itching, Swelling and Rash    Patient Measurements: Height: 5\' 8"  (172.7 cm) Weight: 169 lb 12.8 oz (77 kg) IBW/kg (Calculated) : 68.4  Vital Signs: Temp: 98.6 F (37 C) (10/30 0447) Temp Source: Oral (10/30 0447) BP: 114/84 (10/30 0447) Pulse Rate: 104 (10/30 0447)  Labs: Recent Labs    09/19/19 0210 09/20/19 0438 09/21/19 0321  HGB 13.2 13.6  --   HCT 39.6 40.8  --   PLT 146* 152  --   LABPROT 35.3* 30.5* 29.0*  INR 3.6* 3.0* 2.8*    Estimated Creatinine Clearance: 52.8 mL/min (by C-G formula based on SCr of 0.99 mg/dL).  Assessment: 85 year old male on warfarin prior to admission for Afib, now s/p hip surgery. Patient was reversed with vitamin K 10mg  IV x 1 on 10/22, and heparin and warfarin started post-op on 10/24.   INR down to 2.8 today  Hemoglobin stable  Goal of Therapy:  INR 2-3 Monitor platelets by anticoagulation protocol: Yes   Plan:  -Warfarin 2 mg PO tonight -Daily INR -Watch CBC   Thank you Anette Guarneri, PharmD [phone] Please utilize Amion for appropriate phone number to reach the unit pharmacist ([facility])

## 2019-09-21 NOTE — Plan of Care (Signed)

## 2019-09-21 NOTE — Discharge Summary (Addendum)
Physician Discharge Summary  [name] [name] YNW:[id number] DOB: [birth year] DOA: 09/12/2019  PCP: Rodrigo Ran, MD  Admit date: 09/12/2019 Discharge date: 09/22/2019  Admitted From: home Disposition:  SNF   Recommendations for Outpatient Follow-up:  1. While he is receiving PT, please follow to determine if Coumadin should be continued based on his fall risk  2. He needs assistance with feeding 3. Please check orthostatics intermittently to ensure he is not becoming dehydrated     Discharge Condition:  stable   CODE STATUS:  Full  code   Diet recommendation:  Heart healthy Consultants:   Ortho   Cardiology Procedures:   IM nail   Discharge Diagnoses:  Principal Problem:   Displaced intertrochanteric fracture of left femur, initial encounter for closed fracture [facility]) Active Problems:   Parkinson's disease (HCC)   Long term (current) use of anticoagulants   PAF (paroxysmal atrial fibrillation) (HCC)   H/O mitral valve repair   TIA (transient ischemic attack)   Chronic systolic CHF (congestive heart failure) (HCC)   Dementia without behavioral disturbance (HCC)   Thrombocytopenia (HCC)   Brief Narrative:  [name] [name] 85 year old male with prior h/o parkinson's disease, MVR , h/o endocarditis, MV angioplasty in 2008. PAF on coumadin, TIA, hyperlipidemia, presents after a fall and was found to have acute displaced left intertrochanteric fracture. Cardiology consulted for pre op clearance.  Orthopedics was consulted and he underwent s/p intramedullary nail placement on 09/14/19.     PT/OT evaluation recommending CIR. His insurance has declined to pay and they have opted for him to go to SNF.     Assessment & Plan:   Principal Problem:   Displaced intertrochanteric fracture of left femur, initial encounter for closed fracture  -Status post cephalomedullary nailing on 10/23 -Coumadin resumed by general surgery and was being managed by pharmacy-  - INR 1.7 today  Active  Problems:   Parkinson's disease (HCC) -Continue Sinemet, selegiline and amantadine  Laceration on left ear related to fall -  ear is swollen and bruised, large scabs noted- no ongoing bleeding  Chronic systolic heart failure EF is 45 to 50% per echo on 08/30/2019, patient also has moderate reduction of RV function -Continue Lasix at 20 mg daily and follow fluid status    PAF- valvular - cont Toprol and Coumadin  Mitral valve replacement  Dementia -Continue Aricept- he is moderately confused without behavioral disturbances  Mild chronic thrombocytopenia -Stable      Discharge Exam: Vitals:   09/22/19 0440 09/22/19 0747  BP: (!) 120/91 107/83  Pulse: 82 84  Resp: 18   Temp: 98.5 F (36.9 C) (!) 97.5 F (36.4 C)  SpO2: 93% 100%   Vitals:   09/21/19 0740 09/21/19 1911 09/22/19 0440 09/22/19 0747  BP: 119/77 140/87 (!) 120/91 107/83  Pulse: 98 64 82 84  Resp: Temp: (!) 97.4 F (36.3 C) 98 F (36.7 C) 98.5 F (36.9 C) (!) 97.5 F (36.4 C)  TempSrc: Oral Oral Oral Oral  SpO2: 94% 95% 93% 100%  Weight:      Height:        General: Pt is alert, awake, not in acute distress Cardiovascular: RRR, S1/S2 +, no rubs, no gallops Respiratory: CTA bilaterally, no wheezing, no rhonchi Abdominal: Soft, NT, ND, bowel sounds + Extremities: no edema, no cyanosis   Discharge Instructions  Discharge Instructions    Diet - low sodium heart healthy   Complete by: As directed    Increase activity  slowly   Complete by: As directed      Allergies as of 09/22/2019      Reactions   Apixaban Palpitations, Rash, Other (See Comments)   Worsening tardive dyskinesia and rapid heart rate   Ace Inhibitors Cough   Demerol [meperidine] Other (See Comments)   Parkinsons disease   Poison Ivy Extract Itching, Swelling, Rash      Medication List    STOP taking these medications   amoxicillin 500 MG tablet Commonly known as: AMOXIL     TAKE these medications    acetaminophen 325 MG tablet Commonly known as: TYLENOL Take 2 tablets (650 mg total) by mouth every 6 (six) hours. What changed:   medication strength  how much to take  when to take this  reasons to take this   amantadine 100 MG capsule Commonly known as: SYMMETREL Take 100 mg by mouth See admin instructions. Take 100 mg by mouth at 1 PM and 100 mg at 6 PM   atorvastatin 20 MG tablet Commonly known as: LIPITOR Take 20 mg by mouth every evening.   bisacodyl 5 MG EC tablet Commonly known as: DULCOLAX Take 1 tablet (5 mg total) by mouth daily as needed for moderate constipation.   docusate sodium 100 MG capsule Commonly known as: COLACE Take 100 mg by mouth daily as needed for mild constipation.   donepezil 10 MG tablet Commonly known as: ARICEPT Take 10 mg by mouth at bedtime.   escitalopram 10 MG tablet Commonly known as: LEXAPRO Take 10 mg by mouth daily.   furosemide 20 MG tablet Commonly known as: LASIX Take 0.5-1 tablets (10-20 mg total) by mouth See admin instructions. Take 10 mg by mouth once a day, alternating with 20 mg every other day   metoprolol succinate 25 MG 24 hr tablet Commonly known as: TOPROL-XL Take 12.5 mg by mouth daily. What changed: Another medication with the same name was removed. Continue taking this medication, and follow the directions you see here.   MULTIVITAMIN PO Take 1 tablet by mouth daily.   rotigotine 4 MG/24HR Commonly known as: NEUPRO Place 1 patch onto the skin daily.   Rytary 48.75-195 MG Cpcr Generic drug: Carbidopa-Levodopa ER Take 1-3 capsules by mouth See admin instructions. Take 3 capsules by mouth in the morning, 3 capsules between noon-1 PM, 2 capsules at 6 PM, and 1 capsule at bedtime   selegiline 5 MG tablet Commonly known as: ELDEPRYL Take 2.5-5 mg by mouth See admin instructions. Take 5 mg by mouth in the morning and 2.5 mg at 4 PM   vitamin B-12 1000 MCG tablet Commonly known as: CYANOCOBALAMIN Take  1,000 mcg by mouth 3 (three) times a week.   Vitamin D-3 25 MCG (1000 UT) Caps Take 1,000 Units by mouth daily.   vitamin E 400 UNIT capsule Take 400 Units by mouth daily.   warfarin 5 MG tablet Commonly known as: COUMADIN Take as directed. If you are unsure how to take this medication, talk to your nurse or doctor. Original instructions: Take 1 tablet (5 mg total) by mouth daily. What changed:   how much to take  when to take this      Follow-up Information    Haddix, Gillie Manners, MD. Schedule an appointment as soon as possible for a visit in 2 week(s).   Specialty: Orthopedic Surgery Why: repeat x-rays Contact information: [address] [place] [address] [phone]        [facility] Follow  up.   Specialty: [place] Why: rehab Contact information: [address] [place] [address] [phone]          Allergies  Allergen Reactions  . Apixaban Palpitations, Rash and Other (See Comments)    Worsening tardive dyskinesia and rapid heart rate  . Ace Inhibitors Cough  . Demerol [Meperidine] Other (See Comments)    Parkinsons disease  . Poison Ivy Extract Itching, Swelling and Rash     Procedures/Studies:    Ct Head Wo Contrast  Result Date: 09/12/2019 CLINICAL DATA:  Fall with head pain EXAM: CT HEAD WITHOUT CONTRAST TECHNIQUE: Contiguous axial images were obtained from the base of the skull through the vertex without intravenous contrast. COMPARISON:  None. FINDINGS: Brain: No evidence of acute infarction, hemorrhage, hydrocephalus, extra-axial collection or mass lesion/mass effect. There is mild cerebral volume loss with associated ex vacuo dilatation. Periventricular and subcortical white matter hypoattenuation likely represents chronic small vessel ischemic disease. Vascular: There are vascular calcifications in the carotid siphons. Skull: Normal. Negative for fracture or focal lesion. Sinuses/Orbits: There is  frontal and ethmoid sinus disease. Other: None. IMPRESSION: No acute intracranial process. Electronically Signed   By: Zerita Boers M.D.   On: 09/12/2019 21:16   Dg Shoulder Left  Result Date: 09/12/2019 CLINICAL DATA:  Fall EXAM: LEFT SHOULDER - 2+ VIEW COMPARISON:  None. FINDINGS: No fracture or dislocation. High-riding humeral head. AC joint degenerative change IMPRESSION: 1. No acute osseous abnormality 2. High-riding humeral head, consistent with rotator cuff disease Electronically Signed   By: Donavan Foil M.D.   On: 09/12/2019 20:15   Dg C-arm 1-60 Min  Result Date: 09/14/2019 CLINICAL DATA:  Left femoral nail placement. EXAM: OPERATIVE left HIP (WITH PELVIS IF PERFORMED) 5 VIEWS TECHNIQUE: Fluoroscopic spot image(s) were submitted for interpretation post-operatively. FLUOROSCOPY TIME:  2 minutes 25 seconds. COMPARISON:  September 12, 2019. FINDINGS: Five intraoperative fluoroscopic images of the left hip demonstrate the patient be status post surgical internal fixation of intertrochanteric fracture of the proximal left femur. IMPRESSION: Fluoroscopic guidance provided during surgical internal fixation of intertrochanteric fracture of proximal left femur. Electronically Signed   By: Marijo Conception M.D.   On: 09/14/2019 12:48   Dg Hip Operative Unilat W Or W/o Pelvis Left  Result Date: 09/14/2019 CLINICAL DATA:  Left femoral nail placement. EXAM: OPERATIVE left HIP (WITH PELVIS IF PERFORMED) 5 VIEWS TECHNIQUE: Fluoroscopic spot image(s) were submitted for interpretation post-operatively. FLUOROSCOPY TIME:  2 minutes 25 seconds. COMPARISON:  September 12, 2019. FINDINGS: Five intraoperative fluoroscopic images of the left hip demonstrate the patient be status post surgical internal fixation of intertrochanteric fracture of the proximal left femur. IMPRESSION: Fluoroscopic guidance provided during surgical internal fixation of intertrochanteric fracture of proximal left femur. Electronically Signed    By: Marijo Conception M.D.   On: 09/14/2019 12:48   Dg Hip Unilat W Or W/o Pelvis 2-3 Views Left  Result Date: 09/14/2019 CLINICAL DATA:  Left femur ORIF. EXAM: DG HIP (WITH OR WITHOUT PELVIS) 2-3V LEFT COMPARISON:  Left hip x-rays from same day and September 12, 2019. FINDINGS: Interval left intertrochanteric femur fracture cephalomedullary nail fixation. Postsurgical changes and subcutaneous emphysema about the left lateral hip. IMPRESSION: 1. Interval left intertrochanteric femur fracture ORIF without acute postoperative complication. Electronically Signed   By: Titus Dubin M.D.   On: 09/14/2019 13:50   Dg Hip Unilat With Pelvis 2-3 Views Left  Result Date: 09/12/2019 CLINICAL DATA:  Hip pain status post fall EXAM:  DG HIP (WITH OR WITHOUT PELVIS) 2-3V LEFT COMPARISON:  None. FINDINGS: Pubic symphysis and rami are intact. Both femoral heads project in joint. Mild degenerative changes bilaterally. Acute displaced left trochanteric fracture. IMPRESSION: Acute displaced left trochanteric fracture Electronically Signed   By: Jasmine Pang M.D.   On: 09/12/2019 20:15     The results of significant diagnostics from this hospitalization (including imaging, microbiology, ancillary and laboratory) are listed below for reference.     Microbiology: Recent Results (from the past 240 hour(s))  SARS CORONAVIRUS 2 (TAT 6-24 HRS) Nasopharyngeal Nasopharyngeal Swab     Status: None   Collection Time: 09/12/19  8:44 PM   Specimen: Nasopharyngeal Swab  Result Value Ref Range Status   SARS Coronavirus 2 NEGATIVE NEGATIVE Final    Comment: (NOTE) SARS-CoV-2 target nucleic acids are NOT DETECTED. The SARS-CoV-2 RNA is generally detectable in upper and lower respiratory specimens during the acute phase of infection. Negative results do not preclude SARS-CoV-2 infection, do not rule out co-infections with other pathogens, and should not be used as the sole basis for treatment or other patient management  decisions. Negative results must be combined with clinical observations, patient history, and epidemiological information. The expected result is Negative. Fact Sheet for Patients: HairSlick.no Fact Sheet for Healthcare Providers: quierodirigir.com This test is not yet approved or cleared by the [place] FDA and  has been authorized for detection and/or diagnosis of SARS-CoV-2 by FDA under an Emergency Use Authorization (EUA). This EUA will remain  in effect (meaning this test can be used) for the duration of the COVID-19 declaration under Section 56 4(b)(1) of the Act, 21 U.S.C. section 360bbb-3(b)(1), unless the authorization is terminated or revoked sooner. Performed at [facility], [address] [place], [place] [address]   MRSA PCR Screening     Status: None   Collection Time: 09/14/19 12:55 AM   Specimen: Nasal Mucosa; Nasopharyngeal  Result Value Ref Range Status   MRSA by PCR NEGATIVE NEGATIVE Final    Comment:        The GeneXpert MRSA Assay (FDA approved for NASAL specimens only), is one component of a comprehensive MRSA colonization surveillance program. It is not intended to diagnose MRSA infection nor to guide or monitor treatment for MRSA infections. Performed at [facility], [address] [place], [place] [address]   SARS CORONAVIRUS 2 (TAT 6-24 HRS) Nasopharyngeal Nasopharyngeal Swab     Status: None   Collection Time: 09/20/19  4:52 PM   Specimen: Nasopharyngeal Swab  Result Value Ref Range Status   SARS Coronavirus 2 NEGATIVE NEGATIVE Final    Comment: (NOTE) SARS-CoV-2 target nucleic acids are NOT DETECTED. The SARS-CoV-2 RNA is generally detectable in upper and lower respiratory specimens during the acute phase of infection. Negative results do not preclude SARS-CoV-2 infection, do not rule out co-infections with other pathogens, and should not be used as the sole basis for  treatment or other patient management decisions. Negative results must be combined with clinical observations, patient history, and epidemiological information. The expected result is Negative. Fact Sheet for Patients: HairSlick.no Fact Sheet for Healthcare Providers: quierodirigir.com This test is not yet approved or cleared by the [place] FDA and  has been authorized for detection and/or diagnosis of SARS-CoV-2 by FDA under an Emergency Use Authorization (EUA). This EUA will remain  in effect (meaning this test can be used) for the duration of the COVID-19 declaration under Section 56 4(b)(1) of the Act, 21  U.S.C. section 360bbb-3(b)(1), unless the authorization is terminated or revoked sooner. Performed at [facility], [address] [place], [place] [address]      Labs: BNP (last 3 results) Recent Labs    08/24/19 1206  BNP 670.1*   Basic Metabolic Panel: Recent Labs  Lab 09/17/19 0410  NA 139  K 3.6  CL 106  CO2 22  GLUCOSE 107*  BUN 27*  CREATININE 0.99  CALCIUM 8.4*   Liver Function Tests: No results for input(s): AST, ALT, ALKPHOS, BILITOT, PROT, ALBUMIN in the last 168 hours. No results for input(s): LIPASE, AMYLASE in the last 168 hours. No results for input(s): AMMONIA in the last 168 hours. CBC: Recent Labs  Lab 09/16/19 0521 09/17/19 0410 09/18/19 0331 09/19/19 0210 09/20/19 0438  WBC 6.2 5.4 5.9 5.5 5.1  HGB 13.6 13.1 13.0 13.2 13.6  HCT 41.3 39.4 38.8* 39.6 40.8  MCV 97.2 96.3 95.6 95.9 96.5  PLT 111* 114* 124* 146* 152   Cardiac Enzymes: No results for input(s): CKTOTAL, CKMB, CKMBINDEX, TROPONINI in the last 168 hours. BNP: Invalid input(s): POCBNP CBG: No results for input(s): GLUCAP in the last 168 hours. D-Dimer No results for input(s): DDIMER in the last 72 hours. Hgb A1c No results for input(s): HGBA1C in the last 72 hours. Lipid Profile No results for  input(s): CHOL, HDL, LDLCALC, TRIG, CHOLHDL, LDLDIRECT in the last 72 hours. Thyroid function studies No results for input(s): TSH, T4TOTAL, T3FREE, THYROIDAB in the last 72 hours.  Invalid input(s): FREET3 Anemia work up No results for input(s): VITAMINB12, FOLATE, FERRITIN, TIBC, IRON, RETICCTPCT in the last 72 hours. Urinalysis    Component Value Date/Time   COLORURINE AMBER (A) 09/16/2019 2107   APPEARANCEUR CLEAR 09/16/2019 2107   LABSPEC 1.035 (H) 09/16/2019 2107   PHURINE 5.0 09/16/2019 2107   GLUCOSEU NEGATIVE 09/16/2019 2107   HGBUR NEGATIVE 09/16/2019 2107   BILIRUBINUR SMALL (A) 09/16/2019 2107   KETONESUR 5 (A) 09/16/2019 2107   PROTEINUR 30 (A) 09/16/2019 2107   NITRITE NEGATIVE 09/16/2019 2107   LEUKOCYTESUR NEGATIVE 09/16/2019 2107   Sepsis Labs Invalid input(s): PROCALCITONIN,  WBC,  LACTICIDVEN Microbiology Recent Results (from the past 240 hour(s))  SARS CORONAVIRUS 2 (TAT 6-24 HRS) Nasopharyngeal Nasopharyngeal Swab     Status: None   Collection Time: 09/12/19  8:44 PM   Specimen: Nasopharyngeal Swab  Result Value Ref Range Status   SARS Coronavirus 2 NEGATIVE NEGATIVE Final    Comment: (NOTE) SARS-CoV-2 target nucleic acids are NOT DETECTED. The SARS-CoV-2 RNA is generally detectable in upper and lower respiratory specimens during the acute phase of infection. Negative results do not preclude SARS-CoV-2 infection, do not rule out co-infections with other pathogens, and should not be used as the sole basis for treatment or other patient management decisions. Negative results must be combined with clinical observations, patient history, and epidemiological information. The expected result is Negative. Fact Sheet for Patients: HairSlick.nohttps://www.fda.gov/media/138098/download Fact Sheet for Healthcare Providers: quierodirigir.comhttps://www.fda.gov/media/138095/download This test is not yet approved or cleared by the [place] FDA and  has been authorized for detection  and/or diagnosis of SARS-CoV-2 by FDA under an Emergency Use Authorization (EUA). This EUA will remain  in effect (meaning this test can be used) for the duration of the COVID-19 declaration under Section 56 4(b)(1) of the Act, 21 U.S.C. section 360bbb-3(b)(1), unless the authorization is terminated or revoked sooner. Performed at [facility], [address] [place], [place] [address]   MRSA PCR  Screening     Status: None   Collection Time: 09/14/19 12:55 AM   Specimen: Nasal Mucosa; Nasopharyngeal  Result Value Ref Range Status   MRSA by PCR NEGATIVE NEGATIVE Final    Comment:        The GeneXpert MRSA Assay (FDA approved for NASAL specimens only), is one component of a comprehensive MRSA colonization surveillance program. It is not intended to diagnose MRSA infection nor to guide or monitor treatment for MRSA infections. Performed at [facility], [address], [place], [place] [address]   SARS CORONAVIRUS 2 (TAT 6-24 HRS) Nasopharyngeal Nasopharyngeal Swab     Status: None   Collection Time: 09/20/19  4:52 PM   Specimen: Nasopharyngeal Swab  Result Value Ref Range Status   SARS Coronavirus 2 NEGATIVE NEGATIVE Final    Comment: (NOTE) SARS-CoV-2 target nucleic acids are NOT DETECTED. The SARS-CoV-2 RNA is generally detectable in upper and lower respiratory specimens during the acute phase of infection. Negative results do not preclude SARS-CoV-2 infection, do not rule out co-infections with other pathogens, and should not be used as the sole basis for treatment or other patient management decisions. Negative results must be combined with clinical observations, patient history, and epidemiological information. The expected result is Negative. Fact Sheet for Patients: HairSlick.no Fact Sheet for Healthcare Providers: quierodirigir.com This test is not yet approved or cleared by the [place] FDA and   has been authorized for detection and/or diagnosis of SARS-CoV-2 by FDA under an Emergency Use Authorization (EUA). This EUA will remain  in effect (meaning this test can be used) for the duration of the COVID-19 declaration under Section 56 4(b)(1) of the Act, 21 U.S.C. section 360bbb-3(b)(1), unless the authorization is terminated or revoked sooner. Performed at [facility], [address] [place], [place] [address]      Time coordinating discharge in minutes: 65  SIGNED:   Calvert Cantor, MD  [facility] 09/22/2019, 12:56 PM Pager   If 7PM-7AM, please contact night-coverage [email or web address] Password [email or web address]

## 2019-09-21 NOTE — Plan of Care (Signed)
  Problem: Activity: Goal: Risk for activity intolerance will decrease Outcome: Progressing   Problem: Coping: Goal: Level of anxiety will decrease Outcome: Progressing   Problem: Pain Managment: Goal: General experience of comfort will improve Outcome: Progressing   Problem: Safety: Goal: Ability to remain free from injury will improve Outcome: Progressing   Problem: Skin Integrity: Goal: Risk for impaired skin integrity will decrease Outcome: Progressing   

## 2019-09-21 NOTE — Progress Notes (Addendum)
Physical Therapy Treatment Patient Details Name: [name] [name] MRN: [id number] DOB: 1932/02/11 Today's Date: 09/21/2019    History of Present Illness Pt is an 85 yr old male who had a fall and s/p Cephalomedullary nailing of left intertrochanteric femur fracture. PMH but not limited to: dyslipidemia, parkinson's s/p mitral valve replacement, stroke     PT Comments    Upon passing pt room, pt sliding towards foot rest and was close to sliding towards floor due to pt restlessness and strong dyskinesia. Pt required total +2 lift with assist of RN to positon pt in recliner correctly, as pt sliding towards foot rest significantly. PT facilitated transfer and safe transfer back to bed, pt requiring multimodal cuing throughout. Pt plans to d/c to SNF today.    Follow Up Recommendations  SNF;Supervision/Assistance - 24 hour     Equipment Recommendations  Other (comment)(defer to next venue of care)    Recommendations for Other Services       Precautions / Restrictions Precautions Precautions: Fall Restrictions Weight Bearing Restrictions: No LLE Weight Bearing: Weight bearing as tolerated    Mobility  Bed Mobility Overal bed mobility: Needs Assistance Bed Mobility: Sit to Supine       Sit to supine: Mod assist;HOB elevated   General bed mobility comments: Pt up in chair upon arrival to room. Mod assist for lifting LEs into bed, positioning hips and trunk in bed with use of bed pad and assisted hip bridging.  Transfers Overall transfer level: Needs assistance Equipment used: Rolling walker (2 wheeled) Transfers: Sit to/from Stand Sit to Stand: Mod assist;+2 safety/equipment Stand pivot transfers: From elevated surface;Mod assist       General transfer comment: mod assist for power up, steadying, physical placement of pt hands on recliner armrests to come to standing. Mod assist for stand pivot for steadying, guiding pt hips to EOB, directing  RW.  Ambulation/Gait Ambulation/Gait assistance: Mod assist;+2 safety/equipment Gait Distance (Feet): 10 Feet(10+5, from recliner to bathroom, from bathroom to moved-closer recliner) Assistive device: Rolling walker (2 wheeled) Gait Pattern/deviations: Step-to pattern;Step-through pattern;Decreased stride length;Shuffle;Narrow base of support;Trunk flexed;Drifts right/left;Leaning posteriorly Gait velocity: decr   General Gait Details: Mod assist for steadying, guiding RW and pt, correcting pt posture as pt with heavy posterior leaning initially. Multimodal cuing for upright posture, sequencing with step-through gait (VC "big steps"), placement in RW, and controlled descent when reaching destination seating surface.   Stairs             Wheelchair Mobility    Modified Rankin (Stroke Patients Only)       Balance Overall balance assessment: Needs assistance Sitting-balance support: Feet supported Sitting balance-Leahy Scale: Fair Sitting balance - Comments: pt able to sit edge of chair with UE and LE support   Standing balance support: Bilateral upper extremity supported Standing balance-Leahy Scale: Poor Standing balance comment: reliant on bilateral UEs and external physical assistance to maintain upright                            Cognition Arousal/Alertness: Awake/alert Behavior During Therapy: WFL for tasks assessed/performed Overall Cognitive Status: History of cognitive impairments - at baseline Area of Impairment: Memory;Following commands;Safety/judgement;Problem solving                     Memory: Decreased recall of precautions;Decreased short-term memory Following Commands: Follows one step commands with increased time Safety/Judgement: Decreased awareness of deficits;Decreased awareness of safety   Problem Solving:  Slow processing;Decreased initiation;Difficulty sequencing;Requires verbal cues;Requires tactile cues General Comments: Pt  appears anxious and in distress trying to don briefs and pants with wife, sliding further and further onto foot rest area.      Exercises      General Comments        Pertinent Vitals/Pain Pain Assessment: Faces Faces Pain Scale: Hurts even more Pain Location: LLE with movement Pain Descriptors / Indicators: Guarding;Sore;Spasm Pain Intervention(s): Limited activity within patient's tolerance;Monitored during session;Repositioned    Home Living                      Prior Function            PT Goals (current goals can now be found in the care plan section) Acute Rehab PT Goals Patient Stated Goal: to get stronger PT Goal Formulation: With patient/family Time For Goal Achievement: 09/29/19 Potential to Achieve Goals: Good Progress towards PT goals: Progressing toward goals    Frequency    Min 4X/week      PT Plan Current plan remains appropriate    Co-evaluation              AM-PAC PT "6 Clicks" Mobility   Outcome Measure  Help needed turning from your back to your side while in a flat bed without using bedrails?: A Lot Help needed moving from lying on your back to sitting on the side of a flat bed without using bedrails?: A Lot Help needed moving to and from a bed to a chair (including a wheelchair)?: A Lot Help needed standing up from a chair using your arms (e.g., wheelchair or bedside chair)?: A Lot Help needed to walk in hospital room?: A Lot Help needed climbing 3-5 steps with a railing? : Total 6 Click Score: 11    End of Session Equipment Utilized During Treatment: Gait belt Activity Tolerance: Patient limited by fatigue;Other (comment)(anxiety, restlessness) Patient left: with call bell/phone within reach;with family/visitor present;in bed;with bed alarm set(wife at bedside) Nurse Communication: Mobility status PT Visit Diagnosis: Other abnormalities of gait and mobility (R26.89);Pain Pain - Right/Left: Left Pain - part of body:  Hip     Time: 8657-8469 PT Time Calculation (min) (ACUTE ONLY): 10 min  Charges:  $Gait Training: 8-22 mins $Therapeutic Activity: 8-22 mins                     Latessa Tillis E, PT Acute Rehabilitation Services Pager [phone]  Office [phone]   Sherina Stammer D [place] Cookson 09/21/2019, 4:55 PM

## 2019-09-22 LAB — PROTIME-INR
INR: 2.7 — ABNORMAL HIGH (ref 0.8–1.2)
Prothrombin Time: 28.2 seconds — ABNORMAL HIGH (ref 11.4–15.2)

## 2019-09-22 MED ORDER — WARFARIN SODIUM 2 MG PO TABS
2.0000 mg | ORAL_TABLET | Freq: Once | ORAL | Status: DC
  Filled 2019-09-22: qty 1

## 2019-09-22 NOTE — Progress Notes (Signed)
Patient discharging to SNF. Report called in to [facility] LPN. Awaiting transportation.

## 2019-09-22 NOTE — TOC Transition Note (Signed)
Transition of Care [facility]) - CM/SW Discharge Note   Patient Details  Name: [name] MRN: [id number] Date of Birth: [birth year]  [facility] of [facility] CM/SW Contact:  Bary Castilla, LCSW Phone Number: [phone] 09/22/2019, 1:04 PM   Clinical Narrative:     Patient will DC to:?[place] [facility] Anticipated DC date: 09/22/19? Family notified:?spouse Transport MV:[facility]   Per MD patient ready for DC to [place] at [place].  RN, patient, patient's family, and facility notified of DC. Discharge Summary sent to facility. RN given number for report [phone] Room 70079. DC packet on chart. Ambulance transport requested for patient.   CSW signing off.   [place], [place] [phone]     Final next level of care: Skilled Nursing Facility Barriers to Discharge: No Barriers Identified   Patient Goals and CMS Choice Patient states their goals for this hospitalization and ongoing recovery are:: "to get stronger" CMS Medicare.gov Compare Post Acute Care list provided to:: Patient Choice offered to / list presented to : Patient  Discharge Placement              Patient chooses bed at: [facility] at [facility] Patient to be transferred to facility by: [place] Name of family member notified: [place] Patient and family notified of of transfer: 09/22/19  Discharge Plan and Services In-house Referral: NA Discharge Planning Services: CM Consult Post Acute Care Choice: [place]                               Social Determinants of Health (SDOH) Interventions     Readmission Risk Interventions No flowsheet data found.

## 2019-09-22 NOTE — Progress Notes (Signed)
I have examine the patient today. He remains stable for d/c. Please see d/c summary from 10/31 which I have updated today.   Debbe Odea, MD

## 2019-09-22 NOTE — Progress Notes (Signed)
ANTICOAGULATION CONSULT NOTE - Follow Up Consult  Pharmacy Consult for Warfarin Indication: atrial fibrillation  Allergies  Allergen Reactions  . Apixaban Palpitations, Rash and Other (See Comments)    Worsening tardive dyskinesia and rapid heart rate  . Ace Inhibitors Cough  . Demerol [Meperidine] Other (See Comments)    Parkinsons disease  . Poison Ivy Extract Itching, Swelling and Rash    Patient Measurements: Height: 5\' 8"  (172.7 cm) Weight: 169 lb 12.8 oz (77 kg) IBW/kg (Calculated) : 68.4  Vital Signs: Temp: 98.5 F (36.9 C) (10/31 0440) Temp Source: Oral (10/31 0440) BP: 120/91 (10/31 0440) Pulse Rate: 82 (10/31 0440)  Labs: Recent Labs    09/20/19 0438 09/21/19 0321  HGB 13.6  --   HCT 40.8  --   PLT 152  --   LABPROT 30.5* 29.0*  INR 3.0* 2.8*    Estimated Creatinine Clearance: 52.8 mL/min (by C-G formula based on SCr of 0.99 mg/dL).  Assessment: 85 year old male on warfarin prior to admission for Afib, now s/p hip surgery. PTA warfarin dose 2.5mg  daily, last taken 10/21. Patient INR 3.4 on admission, was reversed with vitamin K 10mg  IV x 1 on 10/22, and heparin infusion started. Warfarin restarted post-op on 10/24.   Today, INR is therapeutic at 2.7, no bleeding noted. Last Hgb WNL.   Goal of Therapy:  INR 2-3 Monitor platelets by anticoagulation protocol: Yes   Plan:  Warfarin 2mg  PO x1 Monitor for s/sx bleeding -Watch CBC   Brendolyn Patty, PharmD PGY2 Pharmacy Resident Phone [phone]  09/22/2019   9:07 AM

## 2019-10-11 ENCOUNTER — Telehealth: Payer: Self-pay

## 2019-10-11 NOTE — Telephone Encounter (Signed)
Patient home yesterday after 10 day hospitalization and about 18 days of rehab center.  Fell and broke leg.  Has had INR checks at hospital and facility, was discharged on 3 mg qd x 0 Monday.  Advised wife to finish off the 3 mg tablets (has 3 doses), skip one day, then resume his previous dose of 2.5 mg daily.  We will hold of on INR check until after Thanksgiving, as he is not very stable with movement at this time.  They do have home assistance right now, but not a [facility] agency with certified medical personnel.  Therefore we cannot get INR draws in the home.    Wife agreeable to get patient into car on Dec 2.  She will come up to the office and sign him in, then we can run down to the car to check INR.  Wife grateful for help.

## 2019-10-11 NOTE — Telephone Encounter (Signed)
Pt's wife called [place] and stated the pt has broken their leg and they would like to take an inr machine to their house right up the street to check it theirselves. They wish to speak to a pharmd.

## 2019-10-19 ENCOUNTER — Emergency Department (HOSPITAL_COMMUNITY): Payer: Medicare Other

## 2019-10-19 ENCOUNTER — Encounter (HOSPITAL_COMMUNITY): Payer: Self-pay | Admitting: Radiology

## 2019-10-19 ENCOUNTER — Inpatient Hospital Stay (HOSPITAL_COMMUNITY)
Admission: EM | Admit: 2019-10-19 | Discharge: 2019-11-04 | DRG: 329 | Disposition: A | Discharge status: Hospice | Payer: Medicare Other | Attending: Internal Medicine | Admitting: Internal Medicine

## 2019-10-19 ENCOUNTER — Other Ambulatory Visit: Payer: Self-pay

## 2019-10-19 ENCOUNTER — Encounter (HOSPITAL_COMMUNITY)
Admission: EM | Disposition: A | Discharge status: Hospice | Payer: Self-pay | Source: Home / Self Care | Attending: Emergency Medicine

## 2019-10-19 DIAGNOSIS — I340 Nonrheumatic mitral (valve) insufficiency: Secondary | ICD-10-CM | POA: Diagnosis present

## 2019-10-19 DIAGNOSIS — E876 Hypokalemia: Secondary | ICD-10-CM | POA: Diagnosis present

## 2019-10-19 DIAGNOSIS — D696 Thrombocytopenia, unspecified: Secondary | ICD-10-CM | POA: Diagnosis not present

## 2019-10-19 DIAGNOSIS — I480 Paroxysmal atrial fibrillation: Secondary | ICD-10-CM | POA: Diagnosis present

## 2019-10-19 DIAGNOSIS — R1312 Dysphagia, oropharyngeal phase: Secondary | ICD-10-CM | POA: Diagnosis present

## 2019-10-19 DIAGNOSIS — I959 Hypotension, unspecified: Secondary | ICD-10-CM | POA: Diagnosis not present

## 2019-10-19 DIAGNOSIS — E872 Acidosis: Secondary | ICD-10-CM | POA: Diagnosis not present

## 2019-10-19 DIAGNOSIS — Z8249 Family history of ischemic heart disease and other diseases of the circulatory system: Secondary | ICD-10-CM

## 2019-10-19 DIAGNOSIS — Z20828 Contact with and (suspected) exposure to other viral communicable diseases: Secondary | ICD-10-CM | POA: Diagnosis present

## 2019-10-19 DIAGNOSIS — Z66 Do not resuscitate: Secondary | ICD-10-CM | POA: Diagnosis present

## 2019-10-19 DIAGNOSIS — Z4659 Encounter for fitting and adjustment of other gastrointestinal appliance and device: Secondary | ICD-10-CM

## 2019-10-19 DIAGNOSIS — Z978 Presence of other specified devices: Secondary | ICD-10-CM

## 2019-10-19 DIAGNOSIS — K659 Peritonitis, unspecified: Secondary | ICD-10-CM | POA: Diagnosis not present

## 2019-10-19 DIAGNOSIS — M7989 Other specified soft tissue disorders: Secondary | ICD-10-CM | POA: Diagnosis not present

## 2019-10-19 DIAGNOSIS — I5022 Chronic systolic (congestive) heart failure: Secondary | ICD-10-CM | POA: Diagnosis present

## 2019-10-19 DIAGNOSIS — E785 Hyperlipidemia, unspecified: Secondary | ICD-10-CM | POA: Diagnosis present

## 2019-10-19 DIAGNOSIS — E878 Other disorders of electrolyte and fluid balance, not elsewhere classified: Secondary | ICD-10-CM | POA: Diagnosis not present

## 2019-10-19 DIAGNOSIS — S72142A Displaced intertrochanteric fracture of left femur, initial encounter for closed fracture: Secondary | ICD-10-CM | POA: Diagnosis present

## 2019-10-19 DIAGNOSIS — F0280 Dementia in other diseases classified elsewhere without behavioral disturbance: Secondary | ICD-10-CM | POA: Diagnosis present

## 2019-10-19 DIAGNOSIS — E870 Hyperosmolality and hypernatremia: Secondary | ICD-10-CM | POA: Diagnosis not present

## 2019-10-19 DIAGNOSIS — Z6825 Body mass index (BMI) 25.0-25.9, adult: Secondary | ICD-10-CM

## 2019-10-19 DIAGNOSIS — F0150 Vascular dementia without behavioral disturbance: Secondary | ICD-10-CM | POA: Diagnosis not present

## 2019-10-19 DIAGNOSIS — Z7901 Long term (current) use of anticoagulants: Secondary | ICD-10-CM

## 2019-10-19 DIAGNOSIS — A419 Sepsis, unspecified organism: Secondary | ICD-10-CM | POA: Diagnosis not present

## 2019-10-19 DIAGNOSIS — R54 Age-related physical debility: Secondary | ICD-10-CM | POA: Diagnosis present

## 2019-10-19 DIAGNOSIS — R17 Unspecified jaundice: Secondary | ICD-10-CM | POA: Diagnosis not present

## 2019-10-19 DIAGNOSIS — G20 Parkinson's disease: Secondary | ICD-10-CM | POA: Diagnosis present

## 2019-10-19 DIAGNOSIS — K658 Other peritonitis: Secondary | ICD-10-CM | POA: Diagnosis present

## 2019-10-19 DIAGNOSIS — K562 Volvulus: Principal | ICD-10-CM

## 2019-10-19 DIAGNOSIS — R1310 Dysphagia, unspecified: Secondary | ICD-10-CM | POA: Diagnosis not present

## 2019-10-19 DIAGNOSIS — I482 Chronic atrial fibrillation, unspecified: Secondary | ICD-10-CM | POA: Diagnosis present

## 2019-10-19 DIAGNOSIS — J90 Pleural effusion, not elsewhere classified: Secondary | ICD-10-CM

## 2019-10-19 DIAGNOSIS — X58XXXA Exposure to other specified factors, initial encounter: Secondary | ICD-10-CM | POA: Diagnosis present

## 2019-10-19 DIAGNOSIS — Z0189 Encounter for other specified special examinations: Secondary | ICD-10-CM

## 2019-10-19 DIAGNOSIS — R748 Abnormal levels of other serum enzymes: Secondary | ICD-10-CM | POA: Diagnosis not present

## 2019-10-19 DIAGNOSIS — G20A1 Parkinson's disease without dyskinesia, without mention of fluctuations: Secondary | ICD-10-CM | POA: Diagnosis present

## 2019-10-19 DIAGNOSIS — E43 Unspecified severe protein-calorie malnutrition: Secondary | ICD-10-CM | POA: Diagnosis present

## 2019-10-19 DIAGNOSIS — R400 Somnolence: Secondary | ICD-10-CM | POA: Diagnosis not present

## 2019-10-19 DIAGNOSIS — Z9911 Dependence on respirator [ventilator] status: Secondary | ICD-10-CM

## 2019-10-19 DIAGNOSIS — Z91048 Other nonmedicinal substance allergy status: Secondary | ICD-10-CM

## 2019-10-19 DIAGNOSIS — R6521 Severe sepsis with septic shock: Secondary | ICD-10-CM | POA: Diagnosis not present

## 2019-10-19 DIAGNOSIS — Z823 Family history of stroke: Secondary | ICD-10-CM

## 2019-10-19 DIAGNOSIS — M21379 Foot drop, unspecified foot: Secondary | ICD-10-CM | POA: Diagnosis present

## 2019-10-19 DIAGNOSIS — F0390 Unspecified dementia without behavioral disturbance: Secondary | ICD-10-CM | POA: Diagnosis not present

## 2019-10-19 DIAGNOSIS — Z952 Presence of prosthetic heart valve: Secondary | ICD-10-CM

## 2019-10-19 DIAGNOSIS — E162 Hypoglycemia, unspecified: Secondary | ICD-10-CM | POA: Diagnosis present

## 2019-10-19 DIAGNOSIS — Z79899 Other long term (current) drug therapy: Secondary | ICD-10-CM

## 2019-10-19 DIAGNOSIS — J9601 Acute respiratory failure with hypoxia: Secondary | ICD-10-CM | POA: Diagnosis not present

## 2019-10-19 DIAGNOSIS — Z8673 Personal history of transient ischemic attack (TIA), and cerebral infarction without residual deficits: Secondary | ICD-10-CM

## 2019-10-19 DIAGNOSIS — Z888 Allergy status to other drugs, medicaments and biological substances status: Secondary | ICD-10-CM

## 2019-10-19 HISTORY — PX: FLEXIBLE SIGMOIDOSCOPY: SHX5431

## 2019-10-19 HISTORY — PX: BOWEL DECOMPRESSION: SHX5532

## 2019-10-19 LAB — COMPREHENSIVE METABOLIC PANEL
ALT: 8 U/L (ref 0–44)
AST: 36 U/L (ref 15–41)
Albumin: 3.1 g/dL — ABNORMAL LOW (ref 3.5–5.0)
Alkaline Phosphatase: 156 U/L — ABNORMAL HIGH (ref 38–126)
Anion gap: 13 (ref 5–15)
BUN: 24 mg/dL — ABNORMAL HIGH (ref 8–23)
CO2: 21 mmol/L — ABNORMAL LOW (ref 22–32)
Calcium: 8.3 mg/dL — ABNORMAL LOW (ref 8.9–10.3)
Chloride: 103 mmol/L (ref 98–111)
Creatinine, Ser: 0.79 mg/dL (ref 0.61–1.24)
GFR calc Af Amer: >60 mL/min (ref >60)
GFR calc non Af Amer: >60 mL/min (ref >60)
Glucose, Bld: 184 mg/dL — ABNORMAL HIGH (ref 70–99)
Potassium: 3.3 mmol/L — ABNORMAL LOW (ref 3.5–5.1)
Sodium: 137 mmol/L (ref 135–145)
Total Bilirubin: 1.5 mg/dL — ABNORMAL HIGH (ref 0.3–1.2)
Total Protein: 6 g/dL — ABNORMAL LOW (ref 6.5–8.1)

## 2019-10-19 LAB — URINALYSIS, ROUTINE W REFLEX MICROSCOPIC
Bacteria, UA: NONE SEEN
Bilirubin Urine: NEGATIVE
Glucose, UA: NEGATIVE mg/dL
Hgb urine dipstick: NEGATIVE
Ketones, ur: 20 mg/dL — AB
Leukocytes,Ua: NEGATIVE
Nitrite: NEGATIVE
Protein, ur: 30 mg/dL — AB
Specific Gravity, Urine: 1.04 — ABNORMAL HIGH (ref 1.005–1.030)
pH: 5 (ref 5.0–8.0)

## 2019-10-19 LAB — CBC WITH DIFFERENTIAL/PLATELET
Abs Immature Granulocytes: 0.02 10*3/uL (ref 0.00–0.07)
Basophils Absolute: 0 10*3/uL (ref 0.0–0.1)
Basophils Relative: 0 %
Eosinophils Absolute: 0 10*3/uL (ref 0.0–0.5)
Eosinophils Relative: 0 %
HCT: 43.8 % (ref 39.0–52.0)
Hemoglobin: 14.1 g/dL (ref 13.0–17.0)
Immature Granulocytes: 0 %
Lymphocytes Relative: 3 %
Lymphs Abs: 0.3 10*3/uL — ABNORMAL LOW (ref 0.7–4.0)
MCH: 32.6 pg (ref 26.0–34.0)
MCHC: 32.2 g/dL (ref 30.0–36.0)
MCV: 101.2 fL — ABNORMAL HIGH (ref 80.0–100.0)
Monocytes Absolute: 0.4 10*3/uL (ref 0.1–1.0)
Monocytes Relative: 5 %
Neutro Abs: 7.5 10*3/uL (ref 1.7–7.7)
Neutrophils Relative %: 92 %
Platelets: 175 10*3/uL (ref 150–400)
RBC: 4.33 MIL/uL (ref 4.22–5.81)
RDW: 16.2 % — ABNORMAL HIGH (ref 11.5–15.5)
WBC: 8.2 10*3/uL (ref 4.0–10.5)
nRBC: 0 % (ref 0.0–0.2)

## 2019-10-19 LAB — TYPE AND SCREEN
ABO/RH(D): O POS
Antibody Screen: NEGATIVE

## 2019-10-19 LAB — PROTIME-INR
INR: 3.3 — ABNORMAL HIGH (ref 0.8–1.2)
Prothrombin Time: 33.3 seconds — ABNORMAL HIGH (ref 11.4–15.2)

## 2019-10-19 LAB — POC SARS CORONAVIRUS 2 AG -  ED: SARS Coronavirus 2 Ag: NEGATIVE

## 2019-10-19 LAB — MAGNESIUM: Magnesium: 2.1 mg/dL (ref 1.7–2.4)

## 2019-10-19 LAB — MRSA PCR SCREENING: MRSA by PCR: NEGATIVE

## 2019-10-19 LAB — LACTIC ACID, PLASMA: Lactic Acid, Venous: 4.9 mmol/L (ref 0.5–1.9)

## 2019-10-19 LAB — SARS CORONAVIRUS 2 BY RT PCR (HOSPITAL ORDER, PERFORMED IN ~~LOC~~ HOSPITAL LAB): SARS Coronavirus 2: NEGATIVE

## 2019-10-19 LAB — LIPASE, BLOOD: Lipase: 20 U/L (ref 11–51)

## 2019-10-19 SURGERY — SIGMOIDOSCOPY, FLEXIBLE
Anesthesia: Moderate Sedation

## 2019-10-19 MED ORDER — PIPERACILLIN-TAZOBACTAM 3.375 G IVPB
3.3750 g | Freq: Three times a day (TID) | INTRAVENOUS | Status: DC
  Administered 2019-10-20 – 2019-10-26 (×20): 3.375 g via INTRAVENOUS
  Filled 2019-10-19 (×20): qty 50

## 2019-10-19 MED ORDER — ACETAMINOPHEN 325 MG PO TABS
650.0000 mg | ORAL_TABLET | ORAL | Status: DC | PRN
  Filled 2019-10-19: qty 2

## 2019-10-19 MED ORDER — ACETAMINOPHEN 10 MG/ML IV SOLN
1000.0000 mg | Freq: Once | INTRAVENOUS | Status: AC
  Administered 2019-10-19: 1000 mg via INTRAVENOUS
  Filled 2019-10-19: qty 100

## 2019-10-19 MED ORDER — POTASSIUM CHLORIDE IN NACL 40-0.9 MEQ/L-% IV SOLN
INTRAVENOUS | Status: DC
  Administered 2019-10-19 – 2019-10-20 (×2): 125 mL/h via INTRAVENOUS
  Filled 2019-10-19 (×3): qty 1000

## 2019-10-19 MED ORDER — SODIUM CHLORIDE 0.9 % IV SOLN
2.0000 g | Freq: Once | INTRAVENOUS | Status: AC
  Administered 2019-10-19: 2 g via INTRAVENOUS
  Filled 2019-10-19: qty 2

## 2019-10-19 MED ORDER — MIDAZOLAM HCL (PF) 10 MG/2ML IJ SOLN
INTRAMUSCULAR | Status: DC | PRN
  Administered 2019-10-19: 2 mg via INTRAVENOUS

## 2019-10-19 MED ORDER — IOHEXOL 300 MG/ML  SOLN
100.0000 mL | Freq: Once | INTRAMUSCULAR | Status: AC | PRN
  Administered 2019-10-19: 100 mL via INTRAVENOUS

## 2019-10-19 MED ORDER — FENTANYL CITRATE (PF) 100 MCG/2ML IJ SOLN
INTRAMUSCULAR | Status: AC
  Filled 2019-10-19: qty 4

## 2019-10-19 MED ORDER — FAMOTIDINE IN NACL 20-0.9 MG/50ML-% IV SOLN
20.0000 mg | Freq: Two times a day (BID) | INTRAVENOUS | Status: DC
  Administered 2019-10-19 – 2019-10-22 (×6): 20 mg via INTRAVENOUS
  Filled 2019-10-19 (×6): qty 50

## 2019-10-19 MED ORDER — SODIUM CHLORIDE 0.9 % IV BOLUS
500.0000 mL | Freq: Once | INTRAVENOUS | Status: AC
  Administered 2019-10-19: 500 mL via INTRAVENOUS

## 2019-10-19 MED ORDER — ONDANSETRON HCL 4 MG/2ML IJ SOLN: 4.0000 mg | Freq: Four times a day (QID) | INTRAMUSCULAR | Status: DC | PRN

## 2019-10-19 MED ORDER — SODIUM CHLORIDE 0.9% IV SOLUTION
Freq: Once | INTRAVENOUS | Status: AC
  Administered 2019-10-19: 17:00:00 via INTRAVENOUS

## 2019-10-19 MED ORDER — MIDAZOLAM HCL (PF) 5 MG/ML IJ SOLN
INTRAMUSCULAR | Status: AC
  Filled 2019-10-19: qty 3

## 2019-10-19 MED ORDER — DILTIAZEM HCL-DEXTROSE 125-5 MG/125ML-% IV SOLN (PREMIX)
5.0000 mg/h | INTRAVENOUS | Status: DC
  Administered 2019-10-19: 5 mg/h via INTRAVENOUS
  Filled 2019-10-19: qty 125

## 2019-10-19 MED ORDER — MORPHINE SULFATE (PF) 4 MG/ML IV SOLN
4.0000 mg | Freq: Once | INTRAVENOUS | Status: AC
  Administered 2019-10-19: 4 mg via INTRAVENOUS
  Filled 2019-10-19: qty 1

## 2019-10-19 MED ORDER — LORAZEPAM 2 MG/ML IJ SOLN
0.5000 mg | INTRAMUSCULAR | Status: DC | PRN
  Administered 2019-10-19: 1 mg via INTRAVENOUS
  Filled 2019-10-19: qty 1

## 2019-10-19 MED ORDER — CHLORHEXIDINE GLUCONATE CLOTH 2 % EX PADS
6.0000 | MEDICATED_PAD | Freq: Every day | CUTANEOUS | Status: DC
  Administered 2019-10-19 – 2019-11-04 (×16): 6 via TOPICAL

## 2019-10-19 MED ORDER — POTASSIUM CHLORIDE 10 MEQ/100ML IV SOLN
10.0000 meq | INTRAVENOUS | Status: AC
  Administered 2019-10-19: 10 meq via INTRAVENOUS
  Filled 2019-10-19: qty 100

## 2019-10-19 MED ORDER — DIPHENHYDRAMINE HCL 50 MG/ML IJ SOLN
25.0000 mg | Freq: Once | INTRAMUSCULAR | Status: AC
  Administered 2019-10-19: 25 mg via INTRAVENOUS
  Filled 2019-10-19: qty 1

## 2019-10-19 MED ORDER — VANCOMYCIN HCL 10 G IV SOLR
1500.0000 mg | Freq: Once | INTRAVENOUS | Status: AC
  Administered 2019-10-19: 1500 mg via INTRAVENOUS
  Filled 2019-10-19: qty 1500

## 2019-10-19 MED ORDER — SODIUM CHLORIDE 0.9 % IV SOLN
INTRAVENOUS | Status: DC
  Administered 2019-10-20: 17:00:00 via INTRAVENOUS

## 2019-10-19 NOTE — ED Triage Notes (Addendum)
Per EMS, patient c/o abdominal pain that started around 0600 today. EMS states patient abdomen is rigid in all quadrants. Patient guarded when attempting to palpate his abdomen. Patient alert and able to answer most questions appropriately. Pain 8/10 . Patient denies any n/v/d. Patient reports left femur fracture with repair around a month ago and had an indwelling catheter for a while

## 2019-10-19 NOTE — ED Notes (Signed)
Attempted to call report for pt transfer to unit. It was requested that I call back in 5 minutes

## 2019-10-19 NOTE — Consult Note (Signed)
Reason for Consult:abd pain Referring Physician: Dr. Retta Dionesay  [name] [name] is an 85 y.o. male.  HPI: The patient is an 85 year old white male who developed abdominal pain over the last day or so.  He was at home recovering from a surgery on his leg where he broke a bone after a fall.  He does have severe Parkinson's and is cared for by his wife at home.  She says that he has not had any nausea or vomiting.  He has had not much in the way of a bowel movement over the last day or so.  She states that he is on Coumadin for a heart valve.  She denies any fevers or chills.  Past Medical History:  Diagnosis Date  . Dyslipidemia   . Parkinson's disease (HCC)   . S/P mitral valve replacement    mitral regurg, endocarditis  . Stroke [facility](HCC)     Past Surgical History:  Procedure Laterality Date  . CATARACT EXTRACTION  2009, 2012   right 2009, left 2012  . INTRAMEDULLARY (IM) NAIL INTERTROCHANTERIC Left 09/14/2019   Procedure: INTRAMEDULLARY (IM) NAIL INTERTROCHANTRIC;  Surgeon: Roby LoftsHaddix, Kevin P, MD;  Location: [facility] OR;  Service: Orthopedics;  Laterality: Left;  . LOOP RECORDER IMPLANT Left 03/13/2015   Procedure: LOOP RECORDER IMPLANT;  Surgeon: Marinus MawGregg W Taylor, MD;  [facility];  Service: Cardiovascular;  Laterality: Left;  . 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 TEE WITHOUT CARDIOVERSION N/A 03/13/2015   Procedure: TRANSESOPHAGEAL ECHOCARDIOGRAM (TEE);  Surgeon: Laurey Moralealton S McLean, MD;  [facility];  Service: Cardiovascular;  Laterality: N/A;  . TRANSTHORACIC ECHOCARDIOGRAM  08/15/2012   EF=>55%; normal LV systolic function; RV mildly dilated & systolic function mildly reduced; RA mod dilated; mild -mod MR & mildy increased gradients; mild TR; AV mildly sclerotic with mild-mod regurg    Family History  Problem Relation Age of Onset  . Stroke Mother        cerebral hemorrahge  . Heart attack Mother    . Prostate cancer Father   . Heart attack Father   . Hypertension Brother   . Valvular heart disease Brother     Social History:  reports that he has never smoked. He has never used smokeless tobacco. He reports current alcohol use of about 2.0 standard drinks of alcohol per week. He reports that he does not use drugs.  Allergies:  Allergies  Allergen Reactions  . Apixaban Palpitations, Rash and Other (See Comments)    Worsening tardive dyskinesia and rapid heart rate  . Ace Inhibitors Cough  . Demerol [Meperidine] Other (See Comments)    Parkinsons disease  . Poison Ivy Extract Itching, Swelling and Rash    Medications: I have reviewed the patient's current medications.  Results for orders placed or performed during the hospital encounter of 10/19/19 (from the past 48 hour(s))  CBC with Differential     Status: Abnormal   Collection Time: 10/19/19  2:00 PM  Result Value Ref Range   WBC 8.2 4.0 - 10.5 K/uL   RBC 4.33 4.22 - 5.81 MIL/uL   Hemoglobin 14.1 13.0 - 17.0 g/dL   HCT 16.143.8 09.639.0 - 04.552.0 %   MCV 101.2 (H) 80.0 - 100.0 fL   MCH 32.6 26.0 - 34.0 pg   MCHC 32.2 30.0 - 36.0 g/dL   RDW 40.916.2 (H) 81.111.5 - 91.415.5 %   Platelets 175  150 - 400 K/uL   nRBC 0.0 0.0 - 0.2 %   Neutrophils Relative % 92 %   Neutro Abs 7.5 1.7 - 7.7 K/uL   Lymphocytes Relative 3 %   Lymphs Abs 0.3 (L) 0.7 - 4.0 K/uL   Monocytes Relative 5 %   Monocytes Absolute 0.4 0.1 - 1.0 K/uL   Eosinophils Relative 0 %   Eosinophils Absolute 0.0 0.0 - 0.5 K/uL   Basophils Relative 0 %   Basophils Absolute 0.0 0.0 - 0.1 K/uL   Immature Granulocytes 0 %   Abs Immature Granulocytes 0.02 0.00 - 0.07 K/uL    Comment: Performed at [facility], [address], [place], [place] [address]  Comprehensive metabolic panel     Status: Abnormal   Collection Time: 10/19/19  2:00 PM  Result Value Ref Range   Sodium 137 135 - 145 mmol/L   Potassium 3.3 (L) 3.5 - 5.1 mmol/L   Chloride 103 98 - 111 mmol/L    CO2 21 (L) 22 - 32 mmol/L   Glucose, Bld 184 (H) 70 - 99 mg/dL   BUN 24 (H) 8 - 23 mg/dL   Creatinine, Ser 9.56 0.61 - 1.24 mg/dL   Calcium 8.3 (L) 8.9 - 10.3 mg/dL   Total Protein 6.0 (L) 6.5 - 8.1 g/dL   Albumin 3.1 (L) 3.5 - 5.0 g/dL   AST 36 15 - 41 U/L   ALT 8 0 - 44 U/L   Alkaline Phosphatase 156 (H) 38 - 126 U/L   Total Bilirubin 1.5 (H) 0.3 - 1.2 mg/dL   GFR calc non Af Amer >60 >60 mL/min   GFR calc Af Amer >60 >60 mL/min   Anion gap 13 5 - 15    Comment: Performed at [facility], [address], [place], [place] [address]  Lipase, blood     Status: None   Collection Time: 10/19/19  2:00 PM  Result Value Ref Range   Lipase 20 11 - 51 U/L    Comment: Performed at [facility], [address], [place], [place] [address]  Urinalysis, Routine w reflex microscopic     Status: Abnormal   Collection Time: 10/19/19  3:19 PM  Result Value Ref Range   Color, Urine AMBER (A) YELLOW    Comment: BIOCHEMICALS MAY BE AFFECTED BY COLOR   APPearance CLEAR CLEAR   Specific Gravity, Urine 1.040 (H) 1.005 - 1.030   pH 5.0 5.0 - 8.0   Glucose, UA NEGATIVE NEGATIVE mg/dL   Hgb urine dipstick NEGATIVE NEGATIVE   Bilirubin Urine NEGATIVE NEGATIVE   Ketones, ur 20 (A) NEGATIVE mg/dL   Protein, ur 30 (A) NEGATIVE mg/dL   Nitrite NEGATIVE NEGATIVE   Leukocytes,Ua NEGATIVE NEGATIVE   RBC / HPF 21-50 0 - 5 RBC/hpf   WBC, UA 0-5 0 - 5 WBC/hpf   Bacteria, UA NONE SEEN NONE SEEN   Mucus PRESENT    Hyaline Casts, UA PRESENT     Comment: Performed at [facility], [address] [place], [place] [address]    Ct Abdomen Pelvis W Contrast  Result Date: 10/19/2019 CLINICAL DATA:  Abdominal pain that started around 6 o'clock today. EXAM: CT ABDOMEN AND PELVIS WITH CONTRAST TECHNIQUE: Multidetector CT imaging of the abdomen and pelvis was performed using the standard protocol following bolus administration of intravenous contrast.  CONTRAST:  OMNIPAQUE IOHEXOL 300 MG/ML  SOLN COMPARISON:  None FINDINGS: Lower chest: Signs of mitral valve  replacement. Cardiac enlargement. Heart is incompletely imaged. Basilar airspace disease with moderate right and small left pleural effusion. Hepatobiliary: Limited assessment due to arm position, technical factors and edema, no gross abnormality. Pancreas: Also with some limited assessment on today's study. Grossly normal. Spleen: Normal in size without focal abnormality. Adrenals/Urinary Tract: Normal adrenal glands. Marked renal cortical scarring right worse than left. No signs of hydronephrosis. Small left renal cyst. Stomach/Bowel: Bowel is difficult to follow throughout the abdomen. The colon is markedly distended, particularly the sigmoid colon. There is a twist in the sigmoid colon with configuration that is compatible with sigmoid volvulus. Bowel edema is present. No signs of pneumatosis at the current time. Small volume ascites. Other bowel loops are difficult to follow as described. Vascular/Lymphatic: Patent abdominal vasculature. No signs of adenopathy in the abdomen or pelvis. Reproductive: Limited assessment of the prostate and pelvic structures secondary to streak artifact from previous ORIF of a left proximal femur fracture. Other: Bowel edema. No current signs of free air. Small volume ascites. Musculoskeletal: No signs of acute or destructive bone process. Postoperative changes related to intertrochanteric fracture ORIF with femoral nailing and hip screw placement partially visualized. Osteopenia. IMPRESSION: 1. Signs of sigmoid volvulus with marked distension of the sigmoid colon. Small volume ascites, no free air or pneumatosis. Surgical and or GI consultation is suggested for consideration of reduction. 2. Basilar airspace disease with moderate effusion, raise the question of pneumonia or aspiration at the right lung base. Small left effusion as well. 3. Cardiac enlargement. 4.  Marked renal cortical scarring right worse than left. 5. Osteopenia. 6. Postoperative changes related to previous ORIF of a left proximal femur fracture. 7. These results were called by telephone at the time of interpretation on 10/19/2019 at 3:34 pm to provider [facility] , who verbally acknowledged these results. Electronically Signed   By: Donzetta Kohut M.D.   On: 10/19/2019 15:36   Dg Chest Portable 1 View  Result Date: 10/19/2019 CLINICAL DATA:  Possible pneumonia on CT EXAM: PORTABLE CHEST 1 VIEW COMPARISON:  Portable exam 1603 hours compared to 03/05/2015 FINDINGS: Enlargement of cardiac silhouette post MVR. Loop recorder projects over chest. Stable mediastinal contours. Bibasilar infiltrates greater on RIGHT consistent with pneumonia. No pleural effusion or pneumothorax. Bowel interposition between liver and diaphragm. Bones demineralized with chronic LEFT rotator cuff tear. IMPRESSION: Enlargement of cardiac silhouette post MVR. Bibasilar infiltrate particularly on RIGHT consistent with pneumonia. Electronically Signed   By: Ulyses Southward M.D.   On: 10/19/2019 16:12    Review of Systems  Constitutional: Negative.   Eyes: Negative.   Respiratory: Negative.   Cardiovascular: Negative.   Gastrointestinal: Positive for abdominal pain. Negative for nausea and vomiting.  Genitourinary: Negative.   Musculoskeletal: Negative.   Skin: Negative.   Neurological: Negative.   Endo/Heme/Allergies: Negative.   Psychiatric/Behavioral: Negative.    Blood pressure 127/80, pulse (!) 104, temperature 98.1 F (36.7 C), temperature source Oral, resp. rate 18, SpO2 97 %. Physical Exam  Constitutional: He is oriented to person, place, and time. He appears well-developed and well-nourished.  Seems uncomfortable. Has severe parkinsons  HENT:  Head: Normocephalic and atraumatic.  Mouth/Throat: No oropharyngeal exudate.  Eyes: Pupils are equal, round, and reactive to light. Conjunctivae and EOM are  normal.  Bruise over left eye  Neck: Normal range of motion. Neck supple.  Cardiovascular: Normal rate, regular rhythm and normal heart sounds.  Respiratory: Effort normal and breath sounds normal. No stridor. No respiratory distress.  GI:  Distended  with moderate tenderness  Musculoskeletal: Normal range of motion.     Comments: Recent surgery for broken leg  Neurological: He is alert and oriented to person, place, and time.  Skin: Skin is warm and dry. No rash noted.  Psychiatric:  Severe parkinsons. Delirium vs dementia    Assessment/Plan: The patient appears to have a sigmoid volvulus on CT scan that was done at the emergency department tonight.  Given his underlying medical conditions I suspect if we could get his colon decompressed it could turn an urgent situation into a situation that we could manage more readily and safely for him.  We will consult gastroenterology for possible colonoscopy with decompression.  He will likely at some point need definitive surgery to remove that segment of the colon.  If this has to be done emergently then he will absolutely need a colostomy bag.  If we can prep his bowel and allow it to decompress this might be able to be done in 1 stage.  I have discussed all this with his family and they want to try decompression with gastroenterology first.  He will need his coags corrected.  Given his mental state I think he needs to be watched closely in the stepdown unit or ICU.  I will transfuse him 4 units of FFP and then recheck his coags.  I appreciate any assistance gastroenterology can offer.  I will follow him closely with you.  Autumn Messing III 10/19/2019, 5:15 PM

## 2019-10-19 NOTE — ED Notes (Signed)
In & Out cath performed, peri-care provided.

## 2019-10-19 NOTE — Op Note (Signed)
[facility] Patient Name: [name] [name] Procedure Date: 10/19/2019 MRN: [id number] Attending MD: Clarene Essex , MD Date of Birth: 02/22/1932 CSN: [id number] Age: 85 Admit Type: Inpatient Procedure:                Flexible Sigmoidoscopy Indications:              For therapy of volvulus Providers:                Clarene Essex, MD, Vista Lawman, RN, Laverda Sorenson,                            Technician Referring MD:              Medicines:                Midazolam 2 mg IV Complications:            No immediate complications. Estimated Blood Loss:     Estimated blood loss: none. Procedure:                Pre-Anesthesia Assessment:                           - Prior to the procedure, a History and Physical                            was performed, and patient medications and                            allergies were reviewed. The patient's tolerance of                            previous anesthesia was also reviewed. The risks                            and benefits of the procedure and the sedation                            options and risks were discussed with the patient.                            All questions were answered, and informed consent                            was obtained. Prior Anticoagulants: The patient has                            taken Coumadin (warfarin), last dose was day of                            procedure. ASA Grade Assessment: IV - A patient                            with severe systemic disease that is a constant  threat to life. After reviewing the risks and                            benefits, the patient was deemed in satisfactory                            condition to undergo the procedure.                           After obtaining informed consent, the scope was                            passed under direct vision. The PCF-H190DL                            ([id number] Olympus pediatric colonscope was               introduced through the anus and advanced to the the                            splenic flexure. The flexible sigmoidoscopy was                            somewhat difficult due to significant looping. The                            patient tolerated the procedure well. The quality                            of the bowel preparation was adequate. Scope In: Scope Out: Findings:      A volvulus with apparent diffuse ischemia vs necrosis was found in the       mid sigmoid colon. Decompression of the volvulus was attempted and was       successful, with complete decompression achieved.      The exam was otherwise without abnormality. Impression:               - Volvulus. Successful complete decompression                            achieved.                           - The examination was otherwise normal.                           - No specimens collected. Moderate Sedation:      Moderate (conscious) sedation was administered by the endoscopy nurse       and supervised by the endoscopist. The following parameters were       monitored: oxygen saturation, heart rate, blood pressure, respiratory       rate, EKG, adequacy of pulmonary ventilation, and response to care. Recommendation:           - NPO.                           -  Continue present medications.                           - Return to GI clinic PRN.                           - Telephone GI clinic if symptomatic PRN. Repeat                            labs and x-ray in a.m. and await surgical                            evaluation in a.m. and case discussed with the                            patient's wife as well after the procedure Procedure Code(s):        --- Professional ---                           [id number], Sigmoidoscopy, flexible; with decompression                            (for pathologic distention) (eg, volvulus,                            megacolon), including placement of decompression                             tube, when performed Diagnosis Code(s):        --- Professional ---                           U04.5K56.2, Volvulus CPT copyright 2019 [employer] Association. All rights reserved. The codes documented in this report are preliminary and upon coder review may  be revised to meet current compliance requirements. Vida RiggerMarc Eliette Drumwright, MD 10/19/2019 10:53:45 PM This report has been signed electronically. Number of Addenda: 0

## 2019-10-19 NOTE — Progress Notes (Signed)
Wasted 3mg  (0.71ml) versed with Vista Mink at pt's bedside.  Vista Lawman, RN

## 2019-10-19 NOTE — Progress Notes (Signed)
CRITICAL VALUE ALERT  Critical Value:  Lactic Acid 4.9  Date & Time Notied:  10/19/2019 2155  Provider Notified: Dr. Silas [place]   Orders Received/Actions taken:

## 2019-10-19 NOTE — ED Provider Notes (Addendum)
[facility] DEPT Provider Note   CSN: [id number] Arrival date & time: 10/19/19  1316     History   Chief Complaint Chief Complaint  Patient presents with   Abdominal Pain    HPI Cutter Passey is a 85 y.o. male.     HPI   Patient is an 85 year old male with a history of dyslipidemia, Parkinson's disease, s/p mitral valve replacement, CVA, paroxysmal A. fib, who presents to the emergency department today for evaluation of abdominal pain.  Patient has history of dementia therefore history is limited.  He states his last BM was 2 days ago.  He is still passing flatus.  He denies any shortness of breath or chest pain at this time.  Patient's wife is at bedside and assist with the history.  She denies that the patient has had any vomiting.  She denies that he has had any cough or complaining of shortness of breath.  She reports that he had a small BM today and that he told her he had one last night as well.  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   Hypokalemia 10/19/2019   PAF (paroxysmal atrial fibrillation) ([place]) 09/21/2019   H/O mitral valve repair 09/21/2019   TIA (transient ischemic attack) 24/40/1027   Chronic systolic CHF (congestive heart failure) ([place]) 09/21/2019   Dementia without behavioral disturbance ([place]) 09/21/2019   Thrombocytopenia ([place]) 09/21/2019   Displaced intertrochanteric fracture of left femur, initial encounter for closed fracture ([place]) 09/13/2019   Long term (current) use of anticoagulants 08/20/2019   HLD (hyperlipidemia) 06/02/2015   PD (Parkinson's disease) ([place]) 06/02/2015   Mitral regurgitation 03/17/2015   CVA (cerebral infarction)    Slurred speech 03/05/2015   Anxiety 12/19/2013     Parkinson's disease ([place]) 08/06/2013   Dyslipidemia 08/06/2013    Past Surgical History:  Procedure Laterality Date   CATARACT EXTRACTION  2009, 2012   right 2009, left 2012   INTRAMEDULLARY (IM) NAIL INTERTROCHANTERIC Left 09/14/2019   Procedure: INTRAMEDULLARY (IM) NAIL INTERTROCHANTRIC;  Surgeon: Shona Needles, MD;  Location: [facility];  Service: Orthopedics;  Laterality: Left;   LOOP RECORDER IMPLANT Left 03/13/2015   Procedure: LOOP RECORDER IMPLANT;  Surgeon: Evans Lance, MD;  Location: [facility] CATH LAB;  Service: Cardiovascular;  Laterality: Left;   MITRAL VALVE ANNULOPLASTY  04/10/2007    79mm Edwards ring; r/t h/o MR and flail mitral leaflets due to endocarditis: MRI safe   TEE WITHOUT CARDIOVERSION N/A 03/13/2015   Procedure: TRANSESOPHAGEAL ECHOCARDIOGRAM (TEE);  Surgeon: Larey Dresser, MD;  Location: [facility];  Service: Cardiovascular;  Laterality: N/A;   TRANSTHORACIC ECHOCARDIOGRAM  08/15/2012   EF=>55%; normal LV systolic function; RV mildly dilated & systolic function mildly reduced; RA mod dilated; mild -mod MR & mildy increased gradients; mild TR; AV mildly sclerotic with mild-mod regurg        Home Medications    Prior to Admission medications   Medication Sig Start Date End Date Taking? Authorizing Provider  acetaminophen (TYLENOL) 325 MG tablet Take 2 tablets (650 mg total) by mouth every 6 (six) hours. Patient taking differently: Take 650 mg by mouth every 6 (six) hours as needed for mild pain.  09/21/19  Yes Debbe Odea, MD  amantadine (SYMMETREL) 100 MG capsule Take 100 mg by mouth See admin instructions. Take 100 mg by mouth at 1 PM and 100 mg at 6 PM   Yes [provider]  atorvastatin (LIPITOR) 20 MG tablet Take 20 mg by mouth every evening.  02/18/15  Yes [provider]  bisacodyl (DULCOLAX) 5 MG EC tablet Take 1 tablet (5 mg total) by mouth daily as needed for moderate constipation. 09/21/19  Yes Calvert Cantor, MD   Cholecalciferol (VITAMIN D-3) 25 MCG (1000 UT) CAPS Take 1,000 Units by mouth daily.   Yes [provider]  clonazePAM (KLONOPIN) 0.25 MG disintegrating tablet Take 0.25 mg by mouth 2 (two) times daily. 10/09/19  Yes [provider]  docusate sodium (COLACE) 100 MG capsule Take 100 mg by mouth daily as needed for mild constipation.   Yes [provider]  donepezil (ARICEPT) 10 MG tablet Take 10 mg by mouth at bedtime.  08/02/19  Yes [provider]  escitalopram (LEXAPRO) 10 MG tablet Take 10 mg by mouth daily.   Yes [provider]  furosemide (LASIX) 20 MG tablet Take 0.5-1 tablets (10-20 mg total) by mouth See admin instructions. Take 10 mg by mouth once a day, alternating with 20 mg every other day Patient taking differently: Take 10 mg by mouth daily.  09/21/19 12/20/19 Yes Calvert Cantor, MD  metoprolol succinate (TOPROL-XL) 25 MG 24 hr tablet Take 12.5 mg by mouth daily.   Yes [provider]  Multiple Vitamins-Minerals (MULTIVITAMIN PO) Take 1 tablet by mouth daily.    Yes [provider]  rotigotine (NEUPRO) 4 MG/24HR Place 1 patch onto the skin daily.   Yes [provider]  RYTARY 48.75-195 MG CPCR Take 1-3 capsules by mouth See admin instructions. Take 3 capsules by mouth in the morning, 3 capsules between noon-1 PM, 2 capsules at 6 PM, and 1 capsule at bedtime 02/18/15  Yes [provider]  selegiline (ELDEPRYL) 5 MG tablet Take 2.5-5 mg by mouth See admin instructions. Take 5 mg by mouth in the morning and 2.5 mg at 4 PM 06/15/19  Yes [provider]  vitamin B-12 (CYANOCOBALAMIN) 1000 MCG tablet Take 1,000 mcg by mouth 3 (three) times a week.    Yes [provider]  vitamin E 400 UNIT capsule Take 400 Units by mouth daily.   Yes [provider]  warfarin (COUMADIN) 5 MG tablet Take 1 tablet (5 mg total) by mouth daily. Patient taking differently: Take 2.5 mg by mouth at bedtime.   08/15/19  Yes Jodelle Gross, NP    Family History Family History  Problem Relation Age of Onset   Stroke Mother        cerebral hemorrahge   Heart attack Mother    Prostate cancer Father    Heart attack Father    Hypertension Brother    Valvular heart disease Brother     Social History Social History   Tobacco Use   Smoking status: Never Smoker   Smokeless tobacco: Never Used  Substance Use Topics   Alcohol use: Yes    Alcohol/week: 2.0 standard drinks    Types: 1 Glasses of wine, 1 Cans of beer per week    Comment: 1 can of beer or 1 wine every day   Drug use: No     Allergies   Apixaban, Ace inhibitors, Demerol [meperidine], and Poison ivy extract   Review of Systems Review of Systems  Unable to perform ROS: Dementia  Physical Exam Updated Vital Signs BP 127/80 (BP Location: Right Arm)    Pulse (!) 104    Temp 98.1 F (36.7 C) (Oral)    Resp 18    SpO2 97%   Physical Exam Vitals signs and nursing note reviewed.  Constitutional:      Appearance: He is well-developed.  HENT:     Head: Normocephalic and atraumatic.  Eyes:     Conjunctiva/sclera: Conjunctivae normal.  Neck:     Musculoskeletal: Neck supple.  Cardiovascular:     Heart sounds: No murmur.     Comments: Irregularly irregular Pulmonary:     Effort: Pulmonary effort is normal. No respiratory distress.     Breath sounds: Normal breath sounds.  Abdominal:     General: Bowel sounds are decreased. There is distension.     Palpations: Abdomen is rigid.     Tenderness: There is abdominal tenderness in the right upper quadrant, right lower quadrant, left upper quadrant and left lower quadrant. There is guarding.  Skin:    General: Skin is warm and dry.  Neurological:     Mental Status: He is alert.     ED Treatments / Results  Labs (all labs ordered are listed, but only abnormal results are displayed) Labs Reviewed  CBC WITH DIFFERENTIAL/PLATELET - Abnormal; Notable for  the following components:      Result Value   MCV 101.2 (*)    RDW 16.2 (*)    Lymphs Abs 0.3 (*)    All other components within normal limits  COMPREHENSIVE METABOLIC PANEL - Abnormal; Notable for the following components:   Potassium 3.3 (*)    CO2 21 (*)    Glucose, Bld 184 (*)    BUN 24 (*)    Calcium 8.3 (*)    Total Protein 6.0 (*)    Albumin 3.1 (*)    Alkaline Phosphatase 156 (*)    Total Bilirubin 1.5 (*)    All other components within normal limits  URINALYSIS, ROUTINE W REFLEX MICROSCOPIC - Abnormal; Notable for the following components:   Color, Urine AMBER (*)    Specific Gravity, Urine 1.040 (*)    Ketones, ur 20 (*)    Protein, ur 30 (*)    All other components within normal limits  URINE CULTURE  SARS CORONAVIRUS 2 (TAT 6-24 HRS)  SARS CORONAVIRUS 2 BY RT PCR (HOSPITAL ORDER, PERFORMED IN [facility])  CULTURE, BLOOD (ROUTINE X 2)  CULTURE, BLOOD (ROUTINE X 2)  LIPASE, BLOOD  PROTIME-INR  PROTIME-INR  MAGNESIUM  LACTIC ACID, PLASMA  LACTIC ACID, PLASMA  MAGNESIUM  BASIC METABOLIC PANEL  CBC  LEGIONELLA PNEUMOPHILA SEROGP 1 UR AG  STREP PNEUMONIAE URINARY ANTIGEN  POC SARS CORONAVIRUS 2 AG -  ED  TYPE AND SCREEN  PREPARE FRESH FROZEN PLASMA    EKG None  Radiology Ct Abdomen Pelvis W Contrast  Result Date: 10/19/2019 CLINICAL DATA:  Abdominal pain that started around 6 o'clock today. EXAM: CT ABDOMEN AND PELVIS WITH CONTRAST TECHNIQUE: Multidetector CT imaging of the abdomen and pelvis was performed using the standard protocol following bolus administration of intravenous contrast. CONTRAST:  OMNIPAQUE IOHEXOL 300 MG/ML  SOLN COMPARISON:  None FINDINGS: Lower chest: Signs of mitral valve replacement. Cardiac enlargement. Heart is incompletely imaged. Basilar airspace disease with moderate right and small left pleural effusion. Hepatobiliary: Limited assessment due to arm position, technical factors and edema, no gross abnormality.  Pancreas: Also with some limited assessment on today's study. Grossly  normal. Spleen: Normal in size without focal abnormality. Adrenals/Urinary Tract: Normal adrenal glands. Marked renal cortical scarring right worse than left. No signs of hydronephrosis. Small left renal cyst. Stomach/Bowel: Bowel is difficult to follow throughout the abdomen. The colon is markedly distended, particularly the sigmoid colon. There is a twist in the sigmoid colon with configuration that is compatible with sigmoid volvulus. Bowel edema is present. No signs of pneumatosis at the current time. Small volume ascites. Other bowel loops are difficult to follow as described. Vascular/Lymphatic: Patent abdominal vasculature. No signs of adenopathy in the abdomen or pelvis. Reproductive: Limited assessment of the prostate and pelvic structures secondary to streak artifact from previous ORIF of a left proximal femur fracture. Other: Bowel edema. No current signs of free air. Small volume ascites. Musculoskeletal: No signs of acute or destructive bone process. Postoperative changes related to intertrochanteric fracture ORIF with femoral nailing and hip screw placement partially visualized. Osteopenia. IMPRESSION: 1. Signs of sigmoid volvulus with marked distension of the sigmoid colon. Small volume ascites, no free air or pneumatosis. Surgical and or GI consultation is suggested for consideration of reduction. 2. Basilar airspace disease with moderate effusion, raise the question of pneumonia or aspiration at the right lung base. Small left effusion as well. 3. Cardiac enlargement. 4. Marked renal cortical scarring right worse than left. 5. Osteopenia. 6. Postoperative changes related to previous ORIF of a left proximal femur fracture. 7. These results were called by telephone at the time of interpretation on 10/19/2019 at 3:34 pm to provider [facility] , who verbally acknowledged these results. Electronically Signed   By: Donzetta KohutGeoffrey  Wile  M.D.   On: 10/19/2019 15:36   Dg Chest Portable 1 View  Result Date: 10/19/2019 CLINICAL DATA:  Possible pneumonia on CT EXAM: PORTABLE CHEST 1 VIEW COMPARISON:  Portable exam 1603 hours compared to 03/05/2015 FINDINGS: Enlargement of cardiac silhouette post MVR. Loop recorder projects over chest. Stable mediastinal contours. Bibasilar infiltrates greater on RIGHT consistent with pneumonia. No pleural effusion or pneumothorax. Bowel interposition between liver and diaphragm. Bones demineralized with chronic LEFT rotator cuff tear. IMPRESSION: Enlargement of cardiac silhouette post MVR. Bibasilar infiltrate particularly on RIGHT consistent with pneumonia. Electronically Signed   By: Ulyses SouthwardMark  Boles M.D.   On: 10/19/2019 16:12    Procedures Procedures (including critical care time) CRITICAL CARE Performed by: Karrie Meresortni S Lovis More   Total critical care time: 40 minutes  Critical care time was exclusive of separately billable procedures and treating other patients.  Critical care was necessary to treat or prevent imminent or life-threatening deterioration.  Critical care was time spent personally by me on the following activities: development of treatment plan with patient and/or surrogate as well as nursing, discussions with consultants, evaluation of patient's response to treatment, examination of patient, obtaining history from patient or surrogate, ordering and performing treatments and interventions, ordering and review of laboratory studies, ordering and review of radiographic studies, pulse oximetry and re-evaluation of patient's condition.   Medications Ordered in ED Medications  ceFEPIme (MAXIPIME) 2 g in sodium chloride 0.9 % 100 mL IVPB (has no administration in time range)  vancomycin (VANCOCIN) 1,500 mg in sodium chloride 0.9 % 500 mL IVPB (1,500 mg Intravenous New Bag/Given 10/19/19 1758)  sodium chloride 0.9 % bolus 500 mL (has no administration in time range)  0.9 %  sodium chloride  infusion (Manually program via Guardrails IV Fluids) (has no administration in time range)  potassium chloride 10 mEq in 100 mL IVPB (has no administration in  time range)  0.9 % NaCl with KCl 40 mEq / L  infusion (has no administration in time range)  acetaminophen (TYLENOL) tablet 650 mg (has no administration in time range)  ondansetron (ZOFRAN) injection 4 mg (has no administration in time range)  famotidine (PEPCID) IVPB 20 mg premix (has no administration in time range)  sodium chloride 0.9 % bolus 500 mL (500 mLs Intravenous New Bag/Given 10/19/19 1436)  morphine 4 MG/ML injection 4 mg (4 mg Intravenous Given 10/19/19 1437)  iohexol (OMNIPAQUE) 300 MG/ML solution 100 mL (100 mLs Intravenous Contrast Given 10/19/19 1508)  morphine 4 MG/ML injection 4 mg (4 mg Intravenous Given 10/19/19 1742)     Initial Impression / Assessment and Plan / ED Course  I have reviewed the triage vital signs and the nursing notes.  Pertinent labs & imaging results that were available during my care of the patient were reviewed by me and considered in my medical decision making (see chart for details).   Final Clinical Impressions(s) / ED Diagnoses   Final diagnoses:  Volvulus (HCC)   85 y/o male with a h/o dementia presenting with abd pain starting today  Cbc w/o leukocytosis, no anemia cmp with mild hypokalemia, elevated bun at 24, normal cr Lipase negative UA w/o evidence of infection  CT abd/pelvis with signs of sigmoid volvulus with marked distension of the sigmoid colon. Small volume ascites, no free air or pneumatosis. Surgical and or GI consultation is suggested for consideration of reduction. 2. Basilar airspace disease with moderate effusion, raise the question of pneumonia or aspiration at the right lung base. Small left effusion as well.  CXR added with showed bibasilar infiltrate more on right consistent with pneumonia.   Pt given broad spectrum abx to cover hcap   CONSULT With Dr.  Carolynne Edouard, with general surgery, who will eval pt at bedside. Recommends consult with GI for poss endoscopic decompression.   CONSULT with Dr. Everardo All with critical care who discussed case with Dr. Carolynne Edouard and clarified that patient does not need to be in the ICU but can be managed on stepdown bed.  5:35 PM CONSULT With Dr. Ewing Schlein with gastroenterology who recommended calling him back when the patients covid test and inr have resulted    Dr. Rosalia Hammers, supervising physician, discussed case with Dr. Jacqulyn Bath who accepted patient for admission.   Rechecked pt. HR ranging from 130s-140s. cardizem drip initiated. Dr. Madilyn Hook, attending physician evaluated pt. She is in agreement with this plan.  8:17 PM Discussed case with Dr. Ewing Schlein, he recommended ordering 1 more unit of ffp. He will discuss case with Dr. Carolynne Edouard.   ED Discharge Orders    None       Karrie Meres, PA-C 10/19/19 1818    Rayne Du 10/19/19 2108    Margarita Grizzle, MD 10/20/19 [phone]

## 2019-10-19 NOTE — ED Notes (Signed)
ED TO INPATIENT HANDOFF REPORT  ED Nurse Name and Phone #: Virgene Tirone  S Name/Age/Gender [name] [name] 85 y.o. male Room/Bed: WA21/WA21  Code Status   Code Status: Full Code  Home/SNF/Other Home Patient oriented to: self Is this baseline? Yes   Triage Complete: Triage complete  Chief Complaint abdominal pain  Triage Note Per EMS, patient c/o abdominal pain that started around 0600 today. EMS states patient abdomen is rigid in all quadrants. Patient guarded when attempting to palpate his abdomen. Patient alert and able to answer most questions appropriately. Pain 8/10 . Patient denies any n/v/d. Patient reports left femur fracture with repair around a month ago and had an indwelling catheter for a while   Allergies Allergies  Allergen Reactions  . Apixaban Palpitations, Rash and Other (See Comments)    Worsening tardive dyskinesia and rapid heart rate  . Ace Inhibitors Cough  . Demerol [Meperidine] Other (See Comments)    Parkinsons disease  . Poison Ivy Extract Itching, Swelling and Rash    Level of Care/Admitting Diagnosis ED Disposition    ED Disposition Condition [facility] Area: [place] [[id number]]  Level of Care: Stepdown [14]  Admit to SDU based on following criteria: Hemodynamic compromise or significant risk of instability:  Patient requiring short term acute titration and management of vasoactive drips, and invasive monitoring (i.e., CVP and Arterial line).  Covid Evaluation: Asymptomatic Screening Protocol (No Symptoms)  Diagnosis: Sigmoid volvulus [facility]) [[id number]]  Admitting Physician: Darliss Cheney [[id number]]  Attending Physician: Darliss Cheney [phone]  Estimated length of stay: 3 - 4 days  Certification:: I certify this patient will need inpatient services for at least 2 midnights  PT Class (Do Not Modify): Inpatient [101]  PT Acc Code (Do Not Modify): Private [1]       B Medical/Surgery History Past Medical History:   Diagnosis Date  . Dyslipidemia   . Parkinson's disease ([place])   . S/P mitral valve replacement    mitral regurg, endocarditis  . Stroke [facility])    Past Surgical History:  Procedure Laterality Date  . CATARACT EXTRACTION  2009, 2012   right 2009, left 2012  . INTRAMEDULLARY (IM) NAIL INTERTROCHANTERIC Left 09/14/2019   Procedure: INTRAMEDULLARY (IM) NAIL INTERTROCHANTRIC;  Surgeon: Shona Needles, MD;  Location: ;  Service: Orthopedics;  Laterality: Left;  . LOOP RECORDER IMPLANT Left 03/13/2015   Procedure: LOOP RECORDER IMPLANT;  Surgeon: Evans Lance, MD;  Location: [facility] CATH LAB;  Service: Cardiovascular;  Laterality: Left;  . MITRAL VALVE ANNULOPLASTY  04/10/2007    2m Edwards ring; r/t h/o MR and flail mitral leaflets due to endocarditis: MRI safe  . TEE WITHOUT CARDIOVERSION N/A 03/13/2015   Procedure: TRANSESOPHAGEAL ECHOCARDIOGRAM (TEE);  Surgeon: DLarey Dresser MD;  [place]  Service: Cardiovascular;  Laterality: N/A;  . TRANSTHORACIC ECHOCARDIOGRAM  08/15/2012   EF=>55%; normal LV systolic function; RV mildly dilated & systolic function mildly reduced; RA mod dilated; mild -mod MR & mildy increased gradients; mild TR; AV mildly sclerotic with mild-mod regurg     A IV Location/Drains/Wounds Patient Lines/Drains/Airways Status   Active Line/Drains/Airways    Name:   Placement date:   Placement time:   Site:   Days:   Peripheral IV 10/19/19 Left Antecubital   10/19/19    1306    Antecubital   less than 1   Peripheral IV 10/19/19 Left Forearm   10/19/19    1750    Forearm  less than 1   Incision (Closed) 09/14/19 Hip Left   09/14/19    1203     35          Intake/Output Last 24 hours  Intake/Output Summary (Last 24 hours) at 10/19/2019 1959 Last data filed at 10/19/2019 1913 Gross per 24 hour  Intake 627.17 ml  Output -  Net 627.17 ml    Labs/Imaging Results for orders placed or performed during the hospital encounter of 10/19/19 (from the  past 48 hour(s))  CBC with Differential     Status: Abnormal   Collection Time: 10/19/19  2:00 PM  Result Value Ref Range   WBC 8.2 4.0 - 10.5 K/uL   RBC 4.33 4.22 - 5.81 MIL/uL   Hemoglobin 14.1 13.0 - 17.0 g/dL   HCT 43.8 39.0 - 52.0 %   MCV 101.2 (H) 80.0 - 100.0 fL   MCH 32.6 26.0 - 34.0 pg   MCHC 32.2 30.0 - 36.0 g/dL   RDW 16.2 (H) 11.5 - 15.5 %   Platelets 175 150 - 400 K/uL   nRBC 0.0 0.0 - 0.2 %   Neutrophils Relative % 92 %   Neutro Abs 7.5 1.7 - 7.7 K/uL   Lymphocytes Relative 3 %   Lymphs Abs 0.3 (L) 0.7 - 4.0 K/uL   Monocytes Relative 5 %   Monocytes Absolute 0.4 0.1 - 1.0 K/uL   Eosinophils Relative 0 %   Eosinophils Absolute 0.0 0.0 - 0.5 K/uL   Basophils Relative 0 %   Basophils Absolute 0.0 0.0 - 0.1 K/uL   Immature Granulocytes 0 %   Abs Immature Granulocytes 0.02 0.00 - 0.07 K/uL    Comment: Performed at [facility], [place] [address], [place], [place] [address]  Comprehensive metabolic panel     Status: Abnormal   Collection Time: 10/19/19  2:00 PM  Result Value Ref Range   Sodium 137 135 - 145 mmol/L   Potassium 3.3 (L) 3.5 - 5.1 mmol/L   Chloride 103 98 - 111 mmol/L   CO2 21 (L) 22 - 32 mmol/L   Glucose, Bld 184 (H) 70 - 99 mg/dL   BUN 24 (H) 8 - 23 mg/dL   Creatinine, Ser 0.79 0.61 - 1.24 mg/dL   Calcium 8.3 (L) 8.9 - 10.3 mg/dL   Total Protein 6.0 (L) 6.5 - 8.1 g/dL   Albumin 3.1 (L) 3.5 - 5.0 g/dL   AST 36 15 - 41 U/L   ALT 8 0 - 44 U/L   Alkaline Phosphatase 156 (H) 38 - 126 U/L   Total Bilirubin 1.5 (H) 0.3 - 1.2 mg/dL   GFR calc non Af Amer >60 >60 mL/min   GFR calc Af Amer >60 >60 mL/min   Anion gap 13 5 - 15    Comment: Performed at [facility], [place] [address] [place], [place] [address]  Lipase, blood     Status: None   Collection Time: 10/19/19  2:00 PM  Result Value Ref Range   Lipase 20 11 - 51 U/L    Comment: Performed at [facility], [place] [address] [place], [place]  [address]  Urinalysis, Routine w reflex microscopic     Status: Abnormal   Collection Time: 10/19/19  3:19 PM  Result Value Ref Range   Color, Urine AMBER (A) YELLOW    Comment: BIOCHEMICALS MAY BE AFFECTED BY COLOR   APPearance CLEAR CLEAR   Specific Gravity, Urine 1.040 (H) 1.005 - 1.030  pH 5.0 5.0 - 8.0   Glucose, UA NEGATIVE NEGATIVE mg/dL   Hgb urine dipstick NEGATIVE NEGATIVE   Bilirubin Urine NEGATIVE NEGATIVE   Ketones, ur 20 (A) NEGATIVE mg/dL   Protein, ur 30 (A) NEGATIVE mg/dL   Nitrite NEGATIVE NEGATIVE   Leukocytes,Ua NEGATIVE NEGATIVE   RBC / HPF 21-50 0 - 5 RBC/hpf   WBC, UA 0-5 0 - 5 WBC/hpf   Bacteria, UA NONE SEEN NONE SEEN   Mucus PRESENT    Hyaline Casts, UA PRESENT     Comment: Performed at [facility], [place] [address] [place], [place] [address]  SARS Coronavirus 2 by RT PCR (hospital order, performed in [facility] hospital lab) Nasopharyngeal Nasopharyngeal Swab     Status: None   Collection Time: 10/19/19  5:56 PM   Specimen: Nasopharyngeal Swab  Result Value Ref Range   SARS Coronavirus 2 NEGATIVE NEGATIVE    Comment: (NOTE) SARS-CoV-2 target nucleic acids are NOT DETECTED. The SARS-CoV-2 RNA is generally detectable in upper and lower respiratory specimens during the acute phase of infection. The lowest concentration of SARS-CoV-2 viral copies this assay can detect is 250 copies / mL. A negative result does not preclude SARS-CoV-2 infection and should not be used as the sole basis for treatment or other patient management decisions.  A negative result may occur with improper specimen collection / handling, submission of specimen other than nasopharyngeal swab, presence of viral mutation(s) within the areas targeted by this assay, and inadequate number of viral copies (<250 copies / mL). A negative result must be combined with clinical observations, patient history, and epidemiological information. Fact Sheet for Patients:    StrictlyIdeas.no Fact Sheet for Healthcare Providers: BankingDealers.co.za This test is not yet approved or cleared  by the [place] FDA and has been authorized for detection and/or diagnosis of SARS-CoV-2 by FDA under an Emergency Use Authorization (EUA).  This EUA will remain in effect (meaning this test can be used) for the duration of the COVID-19 declaration under Section 564(b)(1) of the Act, 21 U.S.C. section 360bbb-3(b)(1), unless the authorization is terminated or revoked sooner. Performed at [facility], [place] [address] [place],  [address]   POC SARS Coronavirus 2 Ag-ED - Nasal Swab (BD Veritor Kit)     Status: None   Collection Time: 10/19/19  6:23 PM  Result Value Ref Range   SARS Coronavirus 2 Ag NEGATIVE NEGATIVE    Comment: (NOTE) SARS-CoV-2 antigen NOT DETECTED.  Negative results are presumptive.  Negative results do not preclude SARS-CoV-2 infection and should not be used as the sole basis for treatment or other patient management decisions, including infection  control decisions, particularly in the presence of clinical signs and  symptoms consistent with COVID-19, or in those who have been in contact with the virus.  Negative results must be combined with clinical observations, patient history, and epidemiological information. The expected result is Negative. Fact Sheet for Patients: PodPark.tn Fact Sheet for Healthcare Providers: GiftContent.is This test is not yet approved or cleared by the [place] FDA and  has been authorized for detection and/or diagnosis of SARS-CoV-2 by FDA under an Emergency Use Authorization (EUA).  This EUA will remain in effect (meaning this test can be used) for the duration of  the COVID-19 de claration under Section 564(b)(1) of the Act, 21 U.S.C. section 360bbb-3(b)(1), unless the  authorization is terminated or revoked sooner.   Protime-INR     Status: Abnormal   Collection Time:  10/19/19  7:22 PM  Result Value Ref Range   Prothrombin Time 33.3 (H) 11.4 - 15.2 seconds   INR 3.3 (H) 0.8 - 1.2    Comment: (NOTE) INR goal varies based on device and disease states. Performed at [facility], [place] [address] [place], [place] [address]    Ct Abdomen Pelvis W Contrast  Result Date: 10/19/2019 CLINICAL DATA:  Abdominal pain that started around 6 o'clock today. EXAM: CT ABDOMEN AND PELVIS WITH CONTRAST TECHNIQUE: Multidetector CT imaging of the abdomen and pelvis was performed using the standard protocol following bolus administration of intravenous contrast. CONTRAST:  15m OMNIPAQUE IOHEXOL 300 MG/ML  SOLN COMPARISON:  None FINDINGS: Lower chest: Signs of mitral valve replacement. Cardiac enlargement. Heart is incompletely imaged. Basilar airspace disease with moderate right and small left pleural effusion. Hepatobiliary: Limited assessment due to arm position, technical factors and edema, no gross abnormality. Pancreas: Also with some limited assessment on today's study. Grossly normal. Spleen: Normal in size without focal abnormality. Adrenals/Urinary Tract: Normal adrenal glands. Marked renal cortical scarring right worse than left. No signs of hydronephrosis. Small left renal cyst. Stomach/Bowel: Bowel is difficult to follow throughout the abdomen. The colon is markedly distended, particularly the sigmoid colon. There is a twist in the sigmoid colon with configuration that is compatible with sigmoid volvulus. Bowel edema is present. No signs of pneumatosis at the current time. Small volume ascites. Other bowel loops are difficult to follow as described. Vascular/Lymphatic: Patent abdominal vasculature. No signs of adenopathy in the abdomen or pelvis. Reproductive: Limited assessment of the prostate and pelvic structures secondary to streak artifact from  previous ORIF of a left proximal femur fracture. Other: Bowel edema. No current signs of free air. Small volume ascites. Musculoskeletal: No signs of acute or destructive bone process. Postoperative changes related to intertrochanteric fracture ORIF with femoral nailing and hip screw placement partially visualized. Osteopenia. IMPRESSION: 1. Signs of sigmoid volvulus with marked distension of the sigmoid colon. Small volume ascites, no free air or pneumatosis. Surgical and or GI consultation is suggested for consideration of reduction. 2. Basilar airspace disease with moderate effusion, raise the question of pneumonia or aspiration at the right lung base. Small left effusion as well. 3. Cardiac enlargement. 4. Marked renal cortical scarring right worse than left. 5. Osteopenia. 6. Postoperative changes related to previous ORIF of a left proximal femur fracture. 7. These results were called by telephone at the time of interpretation on 10/19/2019 at 3:34 pm to [facility] who verbally acknowledged these results. Electronically Signed   By: GZetta BillsM.D.   On: 10/19/2019 15:36   Dg Chest Portable 1 View  Result Date: 10/19/2019 CLINICAL DATA:  Possible pneumonia on CT EXAM: PORTABLE CHEST 1 VIEW COMPARISON:  Portable exam 1603 hours compared to 03/05/2015 FINDINGS: Enlargement of cardiac silhouette post MVR. Loop recorder projects over chest. Stable mediastinal contours. Bibasilar infiltrates greater on RIGHT consistent with pneumonia. No pleural effusion or pneumothorax. Bowel interposition between liver and diaphragm. Bones demineralized with chronic LEFT rotator cuff tear. IMPRESSION: Enlargement of cardiac silhouette post MVR. Bibasilar infiltrate particularly on RIGHT consistent with pneumonia. Electronically Signed   By: MLavonia DanaM.D.   On: 10/19/2019 16:12    Pending Labs Unresulted Labs (From admission, onward)    Start     Ordered   10/20/19 0500  Magnesium  Tomorrow morning,    R     10/19/19 1746   10/20/19 06759 Basic metabolic panel  Tomorrow morning,   R     10/19/19 1746   10/20/19 0500  CBC  Tomorrow morning,   R     10/19/19 1746   10/19/19 1756  Legionella Pneumophila Serogp 1 Ur Ag  Once,   STAT     10/19/19 1755   10/19/19 1756  Strep pneumoniae urinary antigen  Once,   STAT     10/19/19 1755   10/19/19 1756  Culture, blood (routine x 2)  BLOOD CULTURE X 2,   R (with STAT occurrences)     10/19/19 1755   10/19/19 1746  Magnesium  Once,   STAT     10/19/19 1746   10/19/19 1746  Lactic acid, plasma  STAT Now then every 3 hours,   R (with STAT occurrences)     10/19/19 1746   10/19/19 1715  Prepare fresh frozen plasma  (Adult Blood Administration - Fresh Frozen Plasma)  Once,   R    Question Answer Comment  Number of Units 4   Transfusion Indications Warfarin reversal   If emergent release call blood bank [facility] [phone]      10/19/19 1715   10/19/19 1714  Type and screen [place]  ONCE - STAT,   STAT    Comments: [place]    10/19/19 1715   10/19/19 1445  Urine culture  ONCE - STAT,   STAT     10/19/19 1446          Vitals/Pain Today's Vitals   10/19/19 1523 10/19/19 1559 10/19/19 1922 10/19/19 1935  BP:  127/80 112/78   Pulse:  (!) 104 (!) 111   Resp:  18 18   Temp:    98.2 F (36.8 C)  TempSrc:  Oral  Rectal  SpO2:  97% 98%   PainSc: 3        Isolation Precautions No active isolations  Medications Medications  potassium chloride 10 mEq in 100 mL IVPB (10 mEq Intravenous New Bag/Given 10/19/19 1805)  0.9 % NaCl with KCl 40 mEq / L  infusion (has no administration in time range)  acetaminophen (TYLENOL) tablet 650 mg (has no administration in time range)  ondansetron (ZOFRAN) injection 4 mg (has no administration in time range)  famotidine (PEPCID) IVPB 20 mg premix (has no administration in time range)  piperacillin-tazobactam (ZOSYN) IVPB 3.375 g (has no administration  in time range)  diltiazem (CARDIZEM) 125 mg in dextrose 5% 125 mL (1 mg/mL) infusion (has no administration in time range)  sodium chloride 0.9 % bolus 500 mL (0 mLs Intravenous Stopped 10/19/19 1912)  morphine 4 MG/ML injection 4 mg (4 mg Intravenous Given 10/19/19 1437)  iohexol (OMNIPAQUE) 300 MG/ML solution 100 mL (100 mLs Intravenous Contrast Given 10/19/19 1508)  morphine 4 MG/ML injection 4 mg (4 mg Intravenous Given 10/19/19 1742)  ceFEPIme (MAXIPIME) 2 g in sodium chloride 0.9 % 100 mL IVPB (0 g Intravenous Stopped 10/19/19 1911)  vancomycin (VANCOCIN) 1,500 mg in sodium chloride 0.9 % 500 mL IVPB (1,500 mg Intravenous New Bag/Given 10/19/19 1758)  sodium chloride 0.9 % bolus 500 mL (500 mLs Intravenous New Bag/Given 10/19/19 1806)  0.9 %  sodium chloride infusion (Manually program via Guardrails IV Fluids) ( Intravenous Stopped 10/19/19 1913)    Mobility non-ambulatory Low fall risk   Focused Assessments Cardiac Assessment Handoff:  Cardiac Rhythm: Atrial fibrillation No results found for: CKTOTAL, CKMB, CKMBINDEX, TROPONINI No results found for: DDIMER Does the Patient currently have  chest pain? No   , Pulmonary Assessment Handoff:  Lung sounds:   O2 Device: Room Air O2 Flow Rate (L/min): 2 L/min      R Recommendations: See Admitting Provider Note  Report given to:   Additional Notes:

## 2019-10-19 NOTE — Progress Notes (Signed)
Pharmacy Antibiotic Note  Kenrick Pore is a 85 y.o. male admitted on 10/19/2019 with aspiration PNA.  Pharmacy has been consulted for zosyn dosing.  Plan: Zosyn 3.375g IV q8h (4 hour infusion).     Temp (24hrs), Avg:98.1 F (36.7 C), Min:98.1 F (36.7 C), Max:98.1 F (36.7 C)  Recent Labs  Lab 10/19/19 1400  WBC 8.2  CREATININE 0.79    CrCl cannot be calculated (Unknown ideal weight.).    Allergies  Allergen Reactions  . Apixaban Palpitations, Rash and Other (See Comments)    Worsening tardive dyskinesia and rapid heart rate  . Ace Inhibitors Cough  . Demerol [Meperidine] Other (See Comments)    Parkinsons disease  . Poison Ivy Extract Itching, Swelling and Rash    Antimicrobials this admission: 11/27 Vanc x 1 11/27 cefepime x 1 11/27 zosyn >>  Dose adjustments this admission:  Microbiology results: 11/27 UCx: sent 11/27 BCx2: ordered 11/27 strep pneumo: 11/27 legionella:    Thank you for allowing pharmacy to be a part of this patient's care.  Eudelia Bunch, Pharm.D [phone] 10/19/2019 6:03 PM

## 2019-10-19 NOTE — ED Notes (Addendum)
Attempted to call report for pt transport nurse currently unavailable. Will try back in a few minutes

## 2019-10-19 NOTE — H&P (Addendum)
History and Physical    [name] HensenJohn Mcandrew RUE:[id number] DOB: 09/10/1932 DOA: 10/19/2019  PCP: Rodrigo RanPerini, Mark, MD  Patient coming from: Home  I have personally briefly reviewed patient's old medical records in [facility]  Chief Complaint: Abdominal pain  HPI: [name] HensenJohn [name] is a 85 y.o. male with medical history significant of Parkinson's disease, mitral valve repair with history of endocarditis and mitral valve angioplasty in 2008, paroxysmal atrial fibrillation on Coumadin (previously on Eliquis but had inflammatory reaction), history of TIA, hyperlipidemia  was brought into the ED with complaint of abdominal pain.  History provided by wife who is at the bedside since patient is completely lethargic.  According to wife, he started having abdominal pain around 3 in the morning.  No further information about abdominal pain.  According to her, his last bowel movement was sometime yesterday but small amount of BM he passed this morning.  No history of nausea, vomiting, fever, chills, chest pain, shortness of breath or any other complaint.  Of note, [facility] and he was admitted for left displaced intertrochanteric fracture which was surgically fixed.  ED Course: Initially upon presentation to the ED, he was fairly hemodynamically stable with mild tachycardia.  Soon after that, he became more lethargic and developed atrial fibrillation with RVR.  CT abdomen and pelvis was done which showed sigmoid volvulus with possible bibasilar airspace disease.  General surgery was consulted in the ED who after evaluating the patient recommended GI consultation for urgent colonoscopy and decompression.  I was informed by Dr. Rosalia Hammersay from ED that due to severity and acuity of the patient, she thought patient would be better served in ICU setting and she had called Dr. Sinclair ShipEllison/intensivist who had deferred the patient to hospitalist service.  I was also informed by Dr.  Rosalia Hammersay that by the time I was called, GI has been consulted however a return call is still pending.  Upon my evaluation in the ED, patient is lethargic, tachypneic with atrial fibrillation with RVR with heart rate around 124.  His blood pressure thankfully is holding up.  Patient seems to be severely sick and needs urgent GI evaluation as general surgery has recommended.  I believe he will be better served in at least ICU setting and I am very concerned that he is at risk of sudden and severe decline however since that admission is different so we will admit him to stepdown unit.  Review of Systems: As per HPI otherwise negative.    Past Medical History:  Diagnosis Date  . Dyslipidemia   . Parkinson's disease (HCC)   . S/P mitral valve replacement    mitral regurg, endocarditis  . Stroke [facility](HCC)     Past Surgical History:  Procedure Laterality Date  . CATARACT EXTRACTION  2009, 2012   right 2009, left 2012  . INTRAMEDULLARY (IM) NAIL INTERTROCHANTERIC Left 09/14/2019   Procedure: INTRAMEDULLARY (IM) NAIL INTERTROCHANTRIC;  Surgeon: Roby LoftsHaddix, Kevin P, MD;  Location: [facility] OR;  Service: Orthopedics;  Laterality: Left;  . LOOP RECORDER IMPLANT Left 03/13/2015   Procedure: LOOP RECORDER IMPLANT;  Surgeon: Marinus MawGregg W Taylor, MD;  [facility];  Service: Cardiovascular;  Laterality: Left;  . 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 TEE WITHOUT CARDIOVERSION N/A 03/13/2015   Procedure: TRANSESOPHAGEAL ECHOCARDIOGRAM (TEE);  Surgeon: Laurey Moralealton S McLean, MD;  [facility];  Service: Cardiovascular;  Laterality: N/A;  . TRANSTHORACIC ECHOCARDIOGRAM  08/15/2012   EF=>55%; normal LV systolic function; RV mildly dilated & systolic function mildly reduced; RA mod dilated; mild -mod MR & mildy increased gradients; mild TR; AV mildly sclerotic with mild-mod regurg     reports that he has never smoked. He has never  used smokeless tobacco. He reports current alcohol use of about 2.0 standard drinks of alcohol per week. He reports that he does not use drugs.  Allergies  Allergen Reactions  . Apixaban Palpitations, Rash and Other (See Comments)    Worsening tardive dyskinesia and rapid heart rate  . Ace Inhibitors Cough  . Demerol [Meperidine] Other (See Comments)    Parkinsons disease  . Poison Ivy Extract Itching, Swelling and Rash    Family History  Problem Relation Age of Onset  . Stroke Mother        cerebral hemorrahge  . Heart attack Mother   . Prostate cancer Father   . Heart attack Father   . Hypertension Brother   . Valvular heart disease Brother     Prior to Admission medications   Medication Sig Start Date End Date Taking? Authorizing Provider  acetaminophen (TYLENOL) 325 MG tablet Take 2 tablets (650 mg total) by mouth every 6 (six) hours. Patient taking differently: Take 650 mg by mouth every 6 (six) hours as needed for mild pain.  09/21/19  Yes Calvert Cantor, MD  amantadine (SYMMETREL) 100 MG capsule Take 100 mg by mouth See admin instructions. Take 100 mg by mouth at 1 PM and 100 mg at 6 PM   Yes [provider]  atorvastatin (LIPITOR) 20 MG tablet Take 20 mg by mouth every evening.  02/18/15  Yes [provider]  bisacodyl (DULCOLAX) 5 MG EC tablet Take 1 tablet (5 mg total) by mouth daily as needed for moderate constipation. 09/21/19  Yes Calvert Cantor, MD  Cholecalciferol (VITAMIN D-3) 25 MCG (1000 UT) CAPS Take 1,000 Units by mouth daily.   Yes [provider]  clonazePAM (KLONOPIN) 0.25 MG disintegrating tablet Take 0.25 mg by mouth 2 (two) times daily. 10/09/19  Yes [provider]  docusate sodium (COLACE) 100 MG capsule Take 100 mg by mouth daily as needed for mild constipation.   Yes [provider]  donepezil (ARICEPT) 10 MG tablet Take 10 mg by mouth at bedtime.  08/02/19  Yes [provider]  escitalopram (LEXAPRO) 10  MG tablet Take 10 mg by mouth daily.   Yes [provider]  furosemide (LASIX) 20 MG tablet Take 0.5-1 tablets (10-20 mg total) by mouth See admin instructions. Take 10 mg by mouth once a day, alternating with 20 mg every other day Patient taking differently: Take 10 mg by mouth daily.  09/21/19 12/20/19 Yes Calvert Cantor, MD  metoprolol succinate (TOPROL-XL) 25 MG 24 hr tablet Take 12.5 mg by mouth daily.   Yes [provider]  Multiple Vitamins-Minerals (MULTIVITAMIN PO) Take 1 tablet by mouth daily.    Yes [provider]  rotigotine (NEUPRO) 4 MG/24HR Place 1 patch onto the skin daily.   Yes [provider]  RYTARY 48.75-195 MG CPCR Take 1-3 capsules by mouth See admin instructions. Take 3 capsules by mouth in the morning, 3 capsules between noon-1 PM, 2 capsules at 6 PM, and 1 capsule at bedtime 02/18/15  Yes [provider]  selegiline (ELDEPRYL) 5 MG tablet Take 2.5-5 mg by mouth See admin instructions. Take 5 mg by mouth  in the morning and 2.5 mg at 4 PM 06/15/19  Yes [provider]  vitamin B-12 (CYANOCOBALAMIN) 1000 MCG tablet Take 1,000 mcg by mouth 3 (three) times a week.    Yes [provider]  vitamin E 400 UNIT capsule Take 400 Units by mouth daily.   Yes [provider]  warfarin (COUMADIN) 5 MG tablet Take 1 tablet (5 mg total) by mouth daily. Patient taking differently: Take 2.5 mg by mouth at bedtime.  08/15/19  Yes Jodelle Gross, NP    Physical Exam: Vitals:   10/19/19 1326 10/19/19 1336 10/19/19 1559  BP:  (!) 129/104 127/80  Pulse:  81 (!) 104  Resp:  19 18  Temp:  98.1 F (36.7 C)   TempSrc:  Oral Oral  SpO2: 96%  97%    Constitutional: NAD, calm, comfortable Vitals:   10/19/19 1326 10/19/19 1336 10/19/19 1559  BP:  (!) 129/104 127/80  Pulse:  81 (!) 104  Resp:  19 18  Temp:  98.1 F (36.7 C)   TempSrc:  Oral Oral  SpO2: 96%  97%   Eyes: PERRL, lids and conjunctivae normal ENMT:  Mucous membranes are dry. Posterior pharynx clear of any exudate or lesions.Normal dentition.  Neck: normal, supple, no masses, no thyromegaly Respiratory: diminished breath sounds at bases with shallow breathing.  Cardiovascular: irregularly irregular rate and rhythm, no murmurs / rubs / gallops. No extremity edema. 2+ pedal pulses. No carotid bruits.  Abdomen: absent bowel sounds , firm abdomen, tender generalized with guarding.   Musculoskeletal: no clubbing / cyanosis. No joint deformity upper and lower extremities. Good ROM, no contractures. Normal muscle tone.  Skin: no rashes, lesions, ulcers. No induration Neurologic: lethargic but moving all extremities Psychiatric: unable to assess   Labs on Admission: I have personally reviewed following labs and imaging studies  CBC: Recent Labs  Lab 10/19/19 1400  WBC 8.2  NEUTROABS 7.5  HGB 14.1  HCT 43.8  MCV 101.2*  PLT 175   Basic Metabolic Panel: Recent Labs  Lab 10/19/19 1400  NA 137  K 3.3*  CL 103  CO2 21*  GLUCOSE 184*  BUN 24*  CREATININE 0.79  CALCIUM 8.3*   GFR: CrCl cannot be calculated (Unknown ideal weight.). Liver Function Tests: Recent Labs  Lab 10/19/19 1400  AST 36  ALT 8  ALKPHOS 156*  BILITOT 1.5*  PROT 6.0*  ALBUMIN 3.1*   Recent Labs  Lab 10/19/19 1400  LIPASE 20   No results for input(s): AMMONIA in the last 168 hours. Coagulation Profile: No results for input(s): INR, PROTIME in the last 168 hours. Cardiac Enzymes: No results for input(s): CKTOTAL, CKMB, CKMBINDEX, TROPONINI in the last 168 hours. BNP (last 3 results) No results for input(s): PROBNP in the last 8760 hours. HbA1C: No results for input(s): HGBA1C in the last 72 hours. CBG: No results for input(s): GLUCAP in the last 168 hours. Lipid Profile: No results for input(s): CHOL, HDL, LDLCALC, TRIG, CHOLHDL, LDLDIRECT in the last 72 hours. Thyroid Function Tests: No results for input(s): TSH, T4TOTAL, FREET4, T3FREE,  THYROIDAB in the last 72 hours. Anemia Panel: No results for input(s): VITAMINB12, FOLATE, FERRITIN, TIBC, IRON, RETICCTPCT in the last 72 hours. Urine analysis:    Component Value Date/Time   COLORURINE AMBER (A) 10/19/2019 1519   APPEARANCEUR CLEAR 10/19/2019 1519   LABSPEC 1.040 (H) 10/19/2019 1519   PHURINE 5.0 10/19/2019 1519   GLUCOSEU NEGATIVE 10/19/2019 1519   HGBUR NEGATIVE 10/19/2019 1519  BILIRUBINUR NEGATIVE 10/19/2019 1519   KETONESUR 20 (A) 10/19/2019 1519   PROTEINUR 30 (A) 10/19/2019 1519   NITRITE NEGATIVE 10/19/2019 1519   LEUKOCYTESUR NEGATIVE 10/19/2019 1519    Radiological Exams on Admission: Ct Abdomen Pelvis W Contrast  Result Date: 10/19/2019 CLINICAL DATA:  Abdominal pain that started around 6 o'clock today. EXAM: CT ABDOMEN AND PELVIS WITH CONTRAST TECHNIQUE: Multidetector CT imaging of the abdomen and pelvis was performed using the standard protocol following bolus administration of intravenous contrast. CONTRAST:  OMNIPAQUE IOHEXOL 300 MG/ML  SOLN COMPARISON:  None FINDINGS: Lower chest: Signs of mitral valve replacement. Cardiac enlargement. Heart is incompletely imaged. Basilar airspace disease with moderate right and small left pleural effusion. Hepatobiliary: Limited assessment due to arm position, technical factors and edema, no gross abnormality. Pancreas: Also with some limited assessment on today's study. Grossly normal. Spleen: Normal in size without focal abnormality. Adrenals/Urinary Tract: Normal adrenal glands. Marked renal cortical scarring right worse than left. No signs of hydronephrosis. Small left renal cyst. Stomach/Bowel: Bowel is difficult to follow throughout the abdomen. The colon is markedly distended, particularly the sigmoid colon. There is a twist in the sigmoid colon with configuration that is compatible with sigmoid volvulus. Bowel edema is present. No signs of pneumatosis at the current time. Small volume ascites. Other bowel  loops are difficult to follow as described. Vascular/Lymphatic: Patent abdominal vasculature. No signs of adenopathy in the abdomen or pelvis. Reproductive: Limited assessment of the prostate and pelvic structures secondary to streak artifact from previous ORIF of a left proximal femur fracture. Other: Bowel edema. No current signs of free air. Small volume ascites. Musculoskeletal: No signs of acute or destructive bone process. Postoperative changes related to intertrochanteric fracture ORIF with femoral nailing and hip screw placement partially visualized. Osteopenia. IMPRESSION: 1. Signs of sigmoid volvulus with marked distension of the sigmoid colon. Small volume ascites, no free air or pneumatosis. Surgical and or GI consultation is suggested for consideration of reduction. 2. Basilar airspace disease with moderate effusion, raise the question of pneumonia or aspiration at the right lung base. Small left effusion as well. 3. Cardiac enlargement. 4. Marked renal cortical scarring right worse than left. 5. Osteopenia. 6. Postoperative changes related to previous ORIF of a left proximal femur fracture. 7. These results were called by telephone at the time of interpretation on 10/19/2019 at 3:34 pm to provider [facility] , who verbally acknowledged these results. Electronically Signed   By: Donzetta Kohut M.D.   On: 10/19/2019 15:36   Dg Chest Portable 1 View  Result Date: 10/19/2019 CLINICAL DATA:  Possible pneumonia on CT EXAM: PORTABLE CHEST 1 VIEW COMPARISON:  Portable exam 1603 hours compared to 03/05/2015 FINDINGS: Enlargement of cardiac silhouette post MVR. Loop recorder projects over chest. Stable mediastinal contours. Bibasilar infiltrates greater on RIGHT consistent with pneumonia. No pleural effusion or pneumothorax. Bowel interposition between liver and diaphragm. Bones demineralized with chronic LEFT rotator cuff tear. IMPRESSION: Enlargement of cardiac silhouette post MVR. Bibasilar  infiltrate particularly on RIGHT consistent with pneumonia. Electronically Signed   By: Ulyses Southward M.D.   On: 10/19/2019 16:12    EKG: Independently reviewed.  Atrial fibrillation with RVR with heart rate around 110  Assessment/Plan Active Problems:   Parkinson's disease (HCC)   Displaced intertrochanteric fracture of left femur, initial encounter for closed fracture (HCC)   Chronic systolic CHF (congestive heart failure) (HCC)   Dementia without behavioral disturbance (HCC)   Sigmoid volvulus (HCC)   Hypokalemia  Sigmoid volvulus: As mentioned above in detail, patient was already evaluated by general surgery however due to his comorbidities, general surgery recommends consulting GI for urgent colonoscopy and decompression.  GI has been called by ED physician by the time I was called for admission.  Dr. Jeanell Sparrow from ED had reassured me that she will make sure patient gets seen by GI on urgent basis.  We will keep him strict n.p.o. we will check lactic acid.  Patient's COVID-19 test still is pending.  Possible bilateral aspiration pneumonia: At this point in time, I will start him on Zosyn.  I will check blood culture, urine antigen for Legionella and streptococci.  Aspiration precautions.  Parkinson's disease: Resume home medications once able to take p.o.  Hypokalemia: 3.3.  We will start him on IV fluids with potassium in it.  Monitor on telemetry.  Chronic atrial fibrillation now with RVR: By the time I saw patient, patient was taken over by another ED physician, Dr. Ralene Bathe.  I discussed with her about patient's RVR.  I was informed that they are trying to resuscitate patient with fluid first and if that did not work then patient will be started on Cardizem bolus and then drip.  I am keeping patient n.p.o. strictly.  We will resume his home medications once he is able to take p.o.  DVT prophylaxis: Coumadin (INR, PT pending).  General surgery is going to give him FFP's. Code Status: Full  code Family Communication: Wife present at bedside.  Discussed in length with wife. Disposition Plan: To be determined Consults called: GI and general surgery Admission status: Inpatient in the stepdown unit   Darliss Cheney MD [facility]  10/19/2019, 5:47 PM  To contact the attending provider between 7A-7P or the covering provider during after hours 7P-7A, please log into the web site [email or web address] and use password [email or web address].

## 2019-10-19 NOTE — Consult Note (Signed)
Reason for Consult: Volvulus Referring Physician: Hospital team  [name] [name] is an 85 y.o. male.  HPI: Patient with recent orthopedic surgery from a broken leg who has had a colonoscopy years ago but no real GI issues until this week he has had increased abdominal pain and was found to have a volvulus on CT scan and we will call to attempt decompression and his case was discussed with his wife as well as the ER physician since he is not answering questions coherently  Past Medical History:  Diagnosis Date  . Dyslipidemia   . Parkinson's disease ([place])   . S/P mitral valve replacement    mitral regurg, endocarditis  . Stroke [facility])     Past Surgical History:  Procedure Laterality Date  . CATARACT EXTRACTION  2009, 2012   right 2009, left 2012  . INTRAMEDULLARY (IM) NAIL INTERTROCHANTERIC Left 09/14/2019   Procedure: INTRAMEDULLARY (IM) NAIL INTERTROCHANTRIC;  Surgeon: Shona Needles, MD;  Location: [facility];  Service: Orthopedics;  Laterality: Left;  . LOOP RECORDER IMPLANT Left 03/13/2015   Procedure: LOOP RECORDER IMPLANT;  Surgeon: Evans Lance, MD;  Location: [facility] CATH LAB;  Service: Cardiovascular;  Laterality: Left;  . MITRAL VALVE ANNULOPLASTY  04/10/2007    56m Edwards ring; r/t h/o MR and flail mitral leaflets due to endocarditis: MRI safe  . TEE WITHOUT CARDIOVERSION N/A 03/13/2015   Procedure: TRANSESOPHAGEAL ECHOCARDIOGRAM (TEE);  Surgeon: DLarey Dresser MD;  [place]  Service: Cardiovascular;  Laterality: N/A;  . TRANSTHORACIC ECHOCARDIOGRAM  08/15/2012   EF=>55%; normal LV systolic function; RV mildly dilated & systolic function mildly reduced; RA mod dilated; mild -mod MR & mildy increased gradients; mild TR; AV mildly sclerotic with mild-mod regurg    Family History  Problem Relation Age of Onset  . Stroke Mother        cerebral hemorrahge  . Heart attack Mother   . Prostate cancer Father   . Heart attack Father   . Hypertension Brother   . Valvular  heart disease Brother     Social History:  reports that he has never smoked. He has never used smokeless tobacco. He reports current alcohol use of about 2.0 standard drinks of alcohol per week. He reports that he does not use drugs.  Allergies:  Allergies  Allergen Reactions  . Apixaban Palpitations, Rash and Other (See Comments)    Worsening tardive dyskinesia and rapid heart rate  . Ace Inhibitors Cough  . Demerol [Meperidine] Other (See Comments)    Parkinsons disease  . Poison Ivy Extract Itching, Swelling and Rash    Medications: I have reviewed the patient's current medications.  Results for orders placed or performed during the hospital encounter of 10/19/19 (from the past 48 hour(s))  CBC with Differential     Status: Abnormal   Collection Time: 10/19/19  2:00 PM  Result Value Ref Range   WBC 8.2 4.0 - 10.5 K/uL   RBC 4.33 4.22 - 5.81 MIL/uL   Hemoglobin 14.1 13.0 - 17.0 g/dL   HCT 43.8 39.0 - 52.0 %   MCV 101.2 (H) 80.0 - 100.0 fL   MCH 32.6 26.0 - 34.0 pg   MCHC 32.2 30.0 - 36.0 g/dL   RDW 16.2 (H) 11.5 - 15.5 %   Platelets 175 150 - 400 K/uL   nRBC 0.0 0.0 - 0.2 %   Neutrophils Relative % 92 %   Neutro Abs 7.5 1.7 - 7.7 K/uL   Lymphocytes Relative 3 %  Lymphs Abs 0.3 (L) 0.7 - 4.0 K/uL   Monocytes Relative 5 %   Monocytes Absolute 0.4 0.1 - 1.0 K/uL   Eosinophils Relative 0 %   Eosinophils Absolute 0.0 0.0 - 0.5 K/uL   Basophils Relative 0 %   Basophils Absolute 0.0 0.0 - 0.1 K/uL   Immature Granulocytes 0 %   Abs Immature Granulocytes 0.02 0.00 - 0.07 K/uL    Comment: Performed at [facility], [place] [address], [place], [place] [address]  Comprehensive metabolic panel     Status: Abnormal   Collection Time: 10/19/19  2:00 PM  Result Value Ref Range   Sodium 137 135 - 145 mmol/L   Potassium 3.3 (L) 3.5 - 5.1 mmol/L   Chloride 103 98 - 111 mmol/L   CO2 21 (L) 22 - 32 mmol/L   Glucose, Bld 184 (H) 70 - 99 mg/dL   BUN 24 (H) 8 - 23  mg/dL   Creatinine, Ser 0.79 0.61 - 1.24 mg/dL   Calcium 8.3 (L) 8.9 - 10.3 mg/dL   Total Protein 6.0 (L) 6.5 - 8.1 g/dL   Albumin 3.1 (L) 3.5 - 5.0 g/dL   AST 36 15 - 41 U/L   ALT 8 0 - 44 U/L   Alkaline Phosphatase 156 (H) 38 - 126 U/L   Total Bilirubin 1.5 (H) 0.3 - 1.2 mg/dL   GFR calc non Af Amer >60 >60 mL/min   GFR calc Af Amer >60 >60 mL/min   Anion gap 13 5 - 15    Comment: Performed at [facility], [place] [address], [place], [place] [address]  Lipase, blood     Status: None   Collection Time: 10/19/19  2:00 PM  Result Value Ref Range   Lipase 20 11 - 51 U/L    Comment: Performed at [facility], [place] [address] [place], [place] [address]  Urinalysis, Routine w reflex microscopic     Status: Abnormal   Collection Time: 10/19/19  3:19 PM  Result Value Ref Range   Color, Urine AMBER (A) YELLOW    Comment: BIOCHEMICALS MAY BE AFFECTED BY COLOR   APPearance CLEAR CLEAR   Specific Gravity, Urine 1.040 (H) 1.005 - 1.030   pH 5.0 5.0 - 8.0   Glucose, UA NEGATIVE NEGATIVE mg/dL   Hgb urine dipstick NEGATIVE NEGATIVE   Bilirubin Urine NEGATIVE NEGATIVE   Ketones, ur 20 (A) NEGATIVE mg/dL   Protein, ur 30 (A) NEGATIVE mg/dL   Nitrite NEGATIVE NEGATIVE   Leukocytes,Ua NEGATIVE NEGATIVE   RBC / HPF 21-50 0 - 5 RBC/hpf   WBC, UA 0-5 0 - 5 WBC/hpf   Bacteria, UA NONE SEEN NONE SEEN   Mucus PRESENT    Hyaline Casts, UA PRESENT     Comment: Performed at [facility], [place] [address], [place], [place] [address]  SARS Coronavirus 2 by RT PCR (hospital order, performed in [facility] hospital lab) Nasopharyngeal Nasopharyngeal Swab     Status: None   Collection Time: 10/19/19  5:56 PM   Specimen: Nasopharyngeal Swab  Result Value Ref Range   SARS Coronavirus 2 NEGATIVE NEGATIVE    Comment: (NOTE) SARS-CoV-2 target nucleic acids are NOT DETECTED. The SARS-CoV-2 RNA is generally detectable in upper and lower respiratory  specimens during the acute phase of infection. The lowest concentration of SARS-CoV-2 viral copies this assay can detect is 250 copies / mL. A negative result does not preclude SARS-CoV-2 infection and should not  be used as the sole basis for treatment or other patient management decisions.  A negative result may occur with improper specimen collection / handling, submission of specimen other than nasopharyngeal swab, presence of viral mutation(s) within the areas targeted by this assay, and inadequate number of viral copies (<250 copies / mL). A negative result must be combined with clinical observations, patient history, and epidemiological information. Fact Sheet for Patients:   StrictlyIdeas.no Fact Sheet for Healthcare Providers: BankingDealers.co.za This test is not yet approved or cleared  by the [place] FDA and has been authorized for detection and/or diagnosis of SARS-CoV-2 by FDA under an Emergency Use Authorization (EUA).  This EUA will remain in effect (meaning this test can be used) for the duration of the COVID-19 declaration under Section 564(b)(1) of the Act, 21 U.S.C. section 360bbb-3(b)(1), unless the authorization is terminated or revoked sooner. Performed at [facility], [place] [address], [place], [place] [address]   POC SARS Coronavirus 2 Ag-ED - Nasal Swab (BD Veritor Kit)     Status: None   Collection Time: 10/19/19  6:23 PM  Result Value Ref Range   SARS Coronavirus 2 Ag NEGATIVE NEGATIVE    Comment: (NOTE) SARS-CoV-2 antigen NOT DETECTED.  Negative results are presumptive.  Negative results do not preclude SARS-CoV-2 infection and should not be used as the sole basis for treatment or other patient management decisions, including infection  control decisions, particularly in the presence of clinical signs and  symptoms consistent with COVID-19, or in those who have been in contact with  the virus.  Negative results must be combined with clinical observations, patient history, and epidemiological information. The expected result is Negative. Fact Sheet for Patients: PodPark.tn Fact Sheet for Healthcare Providers: GiftContent.is This test is not yet approved or cleared by the [place] FDA and  has been authorized for detection and/or diagnosis of SARS-CoV-2 by FDA under an Emergency Use Authorization (EUA).  This EUA will remain in effect (meaning this test can be used) for the duration of  the COVID-19 de claration under Section 564(b)(1) of the Act, 21 U.S.C. section 360bbb-3(b)(1), unless the authorization is terminated or revoked sooner.   Protime-INR     Status: Abnormal   Collection Time: 10/19/19  7:22 PM  Result Value Ref Range   Prothrombin Time 33.3 (H) 11.4 - 15.2 seconds   INR 3.3 (H) 0.8 - 1.2    Comment: (NOTE) INR goal varies based on device and disease states. Performed at [facility], [place] [address] [place], [place] [address]   Type and screen [place]     Status: None   Collection Time: 10/19/19  7:22 PM  Result Value Ref Range   ABO/RH(D) O POS    Antibody Screen NEG    Sample Expiration      10/22/2019,2359 Performed at [facility], [place] [address], [place], [place] [address]   Prepare fresh frozen plasma     Status: None (Preliminary result)   Collection Time: 10/19/19  9:16 PM  Result Value Ref Range   Unit Number [id number]    Blood Component Type THAWED PLASMA    Unit division 00    Status of Unit ALLOCATED    Transfusion Status      OK TO TRANSFUSE Performed at [place] [address], [place], [place] [address]    Unit Number [id number]    Blood Component Type THAWED PLASMA    Unit  division 00    Status of Unit ALLOCATED    Transfusion Status OK TO TRANSFUSE   Magnesium      Status: None   Collection Time: 10/19/19  9:16 PM  Result Value Ref Range   Magnesium 2.1 1.7 - 2.4 mg/dL    Comment: Performed at [facility], [place] [address], [place], [place] [address]  Lactic acid, plasma     Status: Abnormal   Collection Time: 10/19/19  9:16 PM  Result Value Ref Range   Lactic Acid, Venous 4.9 (HH) 0.5 - 1.9 mmol/L    Comment: CRITICAL RESULT CALLED TO, READ BACK BY AND VERIFIED WITH: J HOUST,RN 10/19/19 2147 RHOLMES Performed at [facility], [place] [address], [place], [place] [address]     Ct Abdomen Pelvis W Contrast  Result Date: 10/19/2019 CLINICAL DATA:  Abdominal pain that started around 6 o'clock today. EXAM: CT ABDOMEN AND PELVIS WITH CONTRAST TECHNIQUE: Multidetector CT imaging of the abdomen and pelvis was performed using the standard protocol following bolus administration of intravenous contrast. CONTRAST:  18m OMNIPAQUE IOHEXOL 300 MG/ML  SOLN COMPARISON:  None FINDINGS: Lower chest: Signs of mitral valve replacement. Cardiac enlargement. Heart is incompletely imaged. Basilar airspace disease with moderate right and small left pleural effusion. Hepatobiliary: Limited assessment due to arm position, technical factors and edema, no gross abnormality. Pancreas: Also with some limited assessment on today's study. Grossly normal. Spleen: Normal in size without focal abnormality. Adrenals/Urinary Tract: Normal adrenal glands. Marked renal cortical scarring right worse than left. No signs of hydronephrosis. Small left renal cyst. Stomach/Bowel: Bowel is difficult to follow throughout the abdomen. The colon is markedly distended, particularly the sigmoid colon. There is a twist in the sigmoid colon with configuration that is compatible with sigmoid volvulus. Bowel edema is present. No signs of pneumatosis at the current time. Small volume ascites. Other bowel loops are difficult to follow as described. Vascular/Lymphatic: Patent  abdominal vasculature. No signs of adenopathy in the abdomen or pelvis. Reproductive: Limited assessment of the prostate and pelvic structures secondary to streak artifact from previous ORIF of a left proximal femur fracture. Other: Bowel edema. No current signs of free air. Small volume ascites. Musculoskeletal: No signs of acute or destructive bone process. Postoperative changes related to intertrochanteric fracture ORIF with femoral nailing and hip screw placement partially visualized. Osteopenia. IMPRESSION: 1. Signs of sigmoid volvulus with marked distension of the sigmoid colon. Small volume ascites, no free air or pneumatosis. Surgical and or GI consultation is suggested for consideration of reduction. 2. Basilar airspace disease with moderate effusion, raise the question of pneumonia or aspiration at the right lung base. Small left effusion as well. 3. Cardiac enlargement. 4. Marked renal cortical scarring right worse than left. 5. Osteopenia. 6. Postoperative changes related to previous ORIF of a left proximal femur fracture. 7. These results were called by telephone at the time of interpretation on 10/19/2019 at 3:34 pm [facility] who verbally acknowledged these results. Electronically Signed   By: GZetta BillsM.D.   On: 10/19/2019 15:36   Dg Chest Portable 1 View  Result Date: 10/19/2019 CLINICAL DATA:  Possible pneumonia on CT EXAM: PORTABLE CHEST 1 VIEW COMPARISON:  Portable exam 1603 hours compared to 03/05/2015 FINDINGS: Enlargement of cardiac silhouette post MVR. Loop recorder projects over chest. Stable mediastinal contours. Bibasilar infiltrates greater on RIGHT consistent with pneumonia. No pleural effusion or pneumothorax. Bowel interposition between liver and diaphragm. Bones demineralized with chronic LEFT  rotator cuff tear. IMPRESSION: Enlargement of cardiac silhouette post MVR. Bibasilar infiltrate particularly on RIGHT consistent with pneumonia. Electronically  Signed   By: Lavonia Dana M.D.   On: 10/19/2019 16:12    ROS unable to answer questions Blood pressure (!) 131/98, pulse (!) 115, temperature 98.2 F (36.8 C), temperature source Rectal, resp. rate (!) 30, SpO2 95 %. Physical Exam exam please see preassessment evaluation decreased heart and lung sounds abdomen is firm rare bowel sounds no obvious rebound labs and CT reviewed viewed  Assessment/Plan: Volvulus in patient with multiple medical problems on Coumadin Plan: The risks and benefits and methods of the procedure was discussed with the patient's wife and we will proceed this evening and the case was discussed with the surgeon we would like to proceed after his FFP is hung to try to decrease bleeding issues and we answered all of the wife's questions including possible need for surgery versus observation and seeing if we can work on preventing a relapse if we are successful in decompressing him  Laketra Bowdish E 10/19/2019, 10:00 PM

## 2019-10-19 NOTE — ED Notes (Signed)
Pt in with portable xray  

## 2019-10-19 NOTE — Progress Notes (Signed)
A consult was received from an ED physician for Vancomycin per pharmacy dosing.  The patient's profile has been reviewed for ht/wt/allergies/indication/available labs. Last weight 10/27 at 77 kg  A one time order has been placed for Vancomycin 1500mg .  Further antibiotics/pharmacy consults should be ordered by admitting physician if indicated. Cefepime 2gm x1 per MD                  Thank you,  [name] PharmD 10/19/2019  4:37 PM

## 2019-10-20 ENCOUNTER — Inpatient Hospital Stay (HOSPITAL_COMMUNITY): Payer: Medicare Other | Admitting: Certified Registered Nurse Anesthetist

## 2019-10-20 ENCOUNTER — Inpatient Hospital Stay (HOSPITAL_COMMUNITY): Payer: Medicare Other

## 2019-10-20 ENCOUNTER — Encounter (HOSPITAL_COMMUNITY)
Admission: EM | Disposition: A | Discharge status: Hospice | Payer: Self-pay | Source: Home / Self Care | Attending: Emergency Medicine

## 2019-10-20 DIAGNOSIS — I959 Hypotension, unspecified: Secondary | ICD-10-CM

## 2019-10-20 DIAGNOSIS — K562 Volvulus: Principal | ICD-10-CM

## 2019-10-20 HISTORY — PX: COLECTOMY WITH COLOSTOMY CREATION/HARTMANN PROCEDURE: SHX6598

## 2019-10-20 LAB — BLOOD GAS, ARTERIAL
Acid-base deficit: 7 mmol/L — ABNORMAL HIGH (ref 0.0–2.0)
Bicarbonate: 18.1 mmol/L — ABNORMAL LOW (ref 20.0–28.0)
O2 Saturation: 99.1 %
Patient temperature: 98.6
pCO2 arterial: 36.9 mmHg (ref 32.0–48.0)
pH, Arterial: 7.313 — ABNORMAL LOW (ref 7.350–7.450)
pO2, Arterial: 170 mmHg — ABNORMAL HIGH (ref 83.0–108.0)

## 2019-10-20 LAB — POCT I-STAT 7, (LYTES, BLD GAS, ICA,H+H)
Acid-base deficit: 11 mmol/L — ABNORMAL HIGH (ref 0.0–2.0)
Acid-base deficit: 9 mmol/L — ABNORMAL HIGH (ref 0.0–2.0)
Bicarbonate: 17.7 mmol/L — ABNORMAL LOW (ref 20.0–28.0)
Bicarbonate: 18.6 mmol/L — ABNORMAL LOW (ref 20.0–28.0)
Calcium, Ion: 1.07 mmol/L — ABNORMAL LOW (ref 1.15–1.40)
Calcium, Ion: 1.12 mmol/L — ABNORMAL LOW (ref 1.15–1.40)
HCT: 37 % — ABNORMAL LOW (ref 39.0–52.0)
HCT: 36 % — ABNORMAL LOW (ref 39.0–52.0)
Hemoglobin: 12.6 g/dL — ABNORMAL LOW (ref 13.0–17.0)
Hemoglobin: 12.2 g/dL — ABNORMAL LOW (ref 13.0–17.0)
O2 Saturation: 99 %
O2 Saturation: 98 %
Patient temperature: 35.1
Patient temperature: 36.3
Potassium: 4.8 mmol/L (ref 3.5–5.1)
Potassium: 4.4 mmol/L (ref 3.5–5.1)
Sodium: 144 mmol/L (ref 135–145)
Sodium: 145 mmol/L (ref 135–145)
TCO2: 19 mmol/L — ABNORMAL LOW (ref 22–32)
TCO2: 20 mmol/L — ABNORMAL LOW (ref 22–32)
pCO2 arterial: 44.1 mmHg (ref 32.0–48.0)
pCO2 arterial: 42.4 mmHg (ref 32.0–48.0)
pH, Arterial: 7.2 — ABNORMAL LOW (ref 7.350–7.450)
pH, Arterial: 7.247 — ABNORMAL LOW (ref 7.350–7.450)
pO2, Arterial: 175 mmHg — ABNORMAL HIGH (ref 83.0–108.0)
pO2, Arterial: 111 mmHg — ABNORMAL HIGH (ref 83.0–108.0)

## 2019-10-20 LAB — PROTIME-INR
INR: 3.4 — ABNORMAL HIGH (ref 0.8–1.2)
INR: 1.8 — ABNORMAL HIGH (ref 0.8–1.2)
Prothrombin Time: 34 seconds — ABNORMAL HIGH (ref 11.4–15.2)
Prothrombin Time: 20.6 seconds — ABNORMAL HIGH (ref 11.4–15.2)

## 2019-10-20 LAB — BASIC METABOLIC PANEL
Anion gap: 11 (ref 5–15)
BUN: 27 mg/dL — ABNORMAL HIGH (ref 8–23)
CO2: 19 mmol/L — ABNORMAL LOW (ref 22–32)
Calcium: 8.1 mg/dL — ABNORMAL LOW (ref 8.9–10.3)
Chloride: 108 mmol/L (ref 98–111)
Creatinine, Ser: 0.96 mg/dL (ref 0.61–1.24)
GFR calc Af Amer: >60 mL/min (ref >60)
GFR calc non Af Amer: >60 mL/min (ref >60)
Glucose, Bld: 106 mg/dL — ABNORMAL HIGH (ref 70–99)
Potassium: 4.1 mmol/L (ref 3.5–5.1)
Sodium: 138 mmol/L (ref 135–145)

## 2019-10-20 LAB — STREP PNEUMONIAE URINARY ANTIGEN: Strep Pneumo Urinary Antigen: NEGATIVE

## 2019-10-20 LAB — LACTIC ACID, PLASMA
Lactic Acid, Venous: 3.3 mmol/L (ref 0.5–1.9)
Lactic Acid, Venous: 3.4 mmol/L (ref 0.5–1.9)
Lactic Acid, Venous: 7.6 mmol/L (ref 0.5–1.9)

## 2019-10-20 LAB — URINE CULTURE: Culture: NO GROWTH

## 2019-10-20 LAB — CBC
HCT: 40 % (ref 39.0–52.0)
Hemoglobin: 12.9 g/dL — ABNORMAL LOW (ref 13.0–17.0)
MCH: 32.7 pg (ref 26.0–34.0)
MCHC: 32.3 g/dL (ref 30.0–36.0)
MCV: 101.3 fL — ABNORMAL HIGH (ref 80.0–100.0)
Platelets: 150 10*3/uL (ref 150–400)
RBC: 3.95 MIL/uL — ABNORMAL LOW (ref 4.22–5.81)
RDW: 16.3 % — ABNORMAL HIGH (ref 11.5–15.5)
WBC: 6.1 10*3/uL (ref 4.0–10.5)
nRBC: 0 % (ref 0.0–0.2)

## 2019-10-20 LAB — MAGNESIUM: Magnesium: 2.1 mg/dL (ref 1.7–2.4)

## 2019-10-20 LAB — GLUCOSE, CAPILLARY: Glucose-Capillary: 156 mg/dL — ABNORMAL HIGH (ref 70–99)

## 2019-10-20 SURGERY — COLECTOMY, WITH COLOSTOMY CREATION
Anesthesia: General | Site: Abdomen

## 2019-10-20 MED ORDER — SODIUM CHLORIDE 0.9 % IV SOLN
500.0000 mL | Freq: Once | INTRAVENOUS | Status: AC
  Administered 2019-10-20: 500 mL via INTRAVENOUS

## 2019-10-20 MED ORDER — VASOPRESSIN 20 UNIT/ML IV SOLN
INTRAVENOUS | Status: AC
  Filled 2019-10-20: qty 1

## 2019-10-20 MED ORDER — SODIUM CHLORIDE 0.9 % IV SOLN: 250.0000 mL | INTRAVENOUS | Status: DC

## 2019-10-20 MED ORDER — ORAL CARE MOUTH RINSE
15.0000 mL | Freq: Two times a day (BID) | OROMUCOSAL | Status: DC
  Administered 2019-10-20 (×2): 15 mL via OROMUCOSAL

## 2019-10-20 MED ORDER — LIDOCAINE 2% (20 MG/ML) 5 ML SYRINGE
INTRAMUSCULAR | Status: AC
  Filled 2019-10-20: qty 5

## 2019-10-20 MED ORDER — PROPOFOL 1000 MG/100ML IV EMUL
0.0000 ug/kg/min | INTRAVENOUS | Status: DC
  Administered 2019-10-20: 5 ug/kg/min via INTRAVENOUS
  Administered 2019-10-21 (×3): 35 ug/kg/min via INTRAVENOUS
  Administered 2019-10-22: 25 ug/kg/min via INTRAVENOUS
  Filled 2019-10-20 (×6): qty 100

## 2019-10-20 MED ORDER — SODIUM BICARBONATE 4.2 % IV SOLN
INTRAVENOUS | Status: DC | PRN
  Administered 2019-10-20 (×2): 50 mL via INTRAVENOUS

## 2019-10-20 MED ORDER — NOREPINEPHRINE 4 MG/250ML-% IV SOLN
0.0000 ug/min | INTRAVENOUS | Status: DC
  Administered 2019-10-20: 2 ug/min via INTRAVENOUS
  Administered 2019-10-21: 9 ug/min via INTRAVENOUS
  Administered 2019-10-21: 7 ug/min via INTRAVENOUS
  Filled 2019-10-20 (×4): qty 250

## 2019-10-20 MED ORDER — SODIUM CHLORIDE 0.9 % IV SOLN
INTRAVENOUS | Status: DC | PRN
  Administered 2019-10-20: 16:00:00 100 ug/min via INTRAVENOUS

## 2019-10-20 MED ORDER — FENTANYL CITRATE (PF) 100 MCG/2ML IJ SOLN
INTRAMUSCULAR | Status: DC | PRN
  Administered 2019-10-20 (×2): 50 ug via INTRAVENOUS

## 2019-10-20 MED ORDER — ETOMIDATE 2 MG/ML IV SOLN
INTRAVENOUS | Status: DC | PRN
  Administered 2019-10-20: 15 mg via INTRAVENOUS

## 2019-10-20 MED ORDER — PHENYLEPHRINE HCL-NACL 10-0.9 MG/250ML-% IV SOLN
25.0000 ug/min | INTRAVENOUS | Status: DC
  Administered 2019-10-20: 25 ug/min via INTRAVENOUS
  Filled 2019-10-20: qty 250

## 2019-10-20 MED ORDER — AMIODARONE HCL IN DEXTROSE 360-4.14 MG/200ML-% IV SOLN
30.0000 mg/h | INTRAVENOUS | Status: DC
  Administered 2019-10-20: 30 mg/h via INTRAVENOUS
  Administered 2019-10-20: 60 mg/h via INTRAVENOUS
  Administered 2019-10-21 – 2019-10-23 (×6): 30 mg/h via INTRAVENOUS
  Filled 2019-10-20 (×6): qty 200

## 2019-10-20 MED ORDER — VITAMIN K1 10 MG/ML IJ SOLN
5.0000 mg | Freq: Once | INTRAVENOUS | Status: AC
  Administered 2019-10-20: 5 mg via INTRAVENOUS
  Filled 2019-10-20: qty 0.5

## 2019-10-20 MED ORDER — LIDOCAINE 2% (20 MG/ML) 5 ML SYRINGE
INTRAMUSCULAR | Status: DC | PRN
  Administered 2019-10-20: 100 mg via INTRAVENOUS

## 2019-10-20 MED ORDER — FENTANYL CITRATE (PF) 100 MCG/2ML IJ SOLN
INTRAMUSCULAR | Status: AC
  Filled 2019-10-20: qty 2

## 2019-10-20 MED ORDER — AMIODARONE LOAD VIA INFUSION
150.0000 mg | Freq: Once | INTRAVENOUS | Status: AC
  Administered 2019-10-20: 150 mg via INTRAVENOUS
  Filled 2019-10-20: qty 83.34

## 2019-10-20 MED ORDER — SODIUM CHLORIDE 0.9% IV SOLUTION: Freq: Once | INTRAVENOUS | Status: DC

## 2019-10-20 MED ORDER — ROCURONIUM BROMIDE 10 MG/ML (PF) SYRINGE
PREFILLED_SYRINGE | INTRAVENOUS | Status: AC
  Filled 2019-10-20: qty 10

## 2019-10-20 MED ORDER — FENTANYL CITRATE (PF) 100 MCG/2ML IJ SOLN
25.0000 ug | INTRAMUSCULAR | Status: DC | PRN
  Administered 2019-10-22 – 2019-10-24 (×2): 50 ug via INTRAVENOUS
  Administered 2019-10-24: 23:00:00 100 ug via INTRAVENOUS
  Filled 2019-10-20 (×4): qty 2

## 2019-10-20 MED ORDER — ETOMIDATE 2 MG/ML IV SOLN
INTRAVENOUS | Status: AC
  Filled 2019-10-20: qty 10

## 2019-10-20 MED ORDER — 0.9 % SODIUM CHLORIDE (POUR BTL) OPTIME
TOPICAL | Status: DC | PRN
  Administered 2019-10-20: 7000 mL

## 2019-10-20 MED ORDER — LACTATED RINGERS IV SOLN
INTRAVENOUS | Status: DC | PRN
  Administered 2019-10-20: 18:00:00 via INTRAVENOUS

## 2019-10-20 MED ORDER — ROCURONIUM BROMIDE 10 MG/ML (PF) SYRINGE
PREFILLED_SYRINGE | INTRAVENOUS | Status: DC | PRN
  Administered 2019-10-20: 100 mg via INTRAVENOUS

## 2019-10-20 MED ORDER — ALBUMIN HUMAN 25 % IV SOLN
25.0000 g | Freq: Once | INTRAVENOUS | Status: AC
  Administered 2019-10-20: 25 g via INTRAVENOUS
  Filled 2019-10-20: qty 100

## 2019-10-20 MED ORDER — DEXTROSE-NACL 5-0.9 % IV SOLN
INTRAVENOUS | Status: DC
  Administered 2019-10-21 – 2019-10-22 (×3): via INTRAVENOUS

## 2019-10-20 MED ORDER — MIDAZOLAM HCL 2 MG/2ML IJ SOLN
4.0000 mg | Freq: Once | INTRAMUSCULAR | Status: AC
  Administered 2019-10-20: 2 mg via INTRAVENOUS

## 2019-10-20 MED ORDER — PHENYLEPHRINE 40 MCG/ML (10ML) SYRINGE FOR IV PUSH (FOR BLOOD PRESSURE SUPPORT)
PREFILLED_SYRINGE | INTRAVENOUS | Status: AC
  Filled 2019-10-20: qty 10

## 2019-10-20 MED ORDER — SODIUM CHLORIDE 0.9 % IV BOLUS
1000.0000 mL | Freq: Once | INTRAVENOUS | Status: AC
  Administered 2019-10-20: 1000 mL via INTRAVENOUS

## 2019-10-20 MED ORDER — SODIUM CHLORIDE 0.9% FLUSH
10.0000 mL | Freq: Two times a day (BID) | INTRAVENOUS | Status: DC
  Administered 2019-10-20 – 2019-10-24 (×8): 10 mL
  Administered 2019-10-24: 09:00:00 40 mL
  Administered 2019-10-25: 22:00:00 30 mL
  Administered 2019-10-25 – 2019-10-26 (×2): 10 mL
  Administered 2019-10-27: 30 mL
  Administered 2019-10-27: 20 mL
  Administered 2019-10-27 – 2019-11-03 (×14): 10 mL

## 2019-10-20 MED ORDER — VASOPRESSIN 20 UNIT/ML IV SOLN
INTRAVENOUS | Status: DC | PRN
  Administered 2019-10-20 (×3): 2 [IU] via INTRAVENOUS

## 2019-10-20 MED ORDER — FENTANYL CITRATE (PF) 100 MCG/2ML IJ SOLN
25.0000 ug | INTRAMUSCULAR | Status: DC | PRN
  Administered 2019-10-22 (×2): 25 ug via INTRAVENOUS
  Filled 2019-10-20 (×2): qty 2

## 2019-10-20 MED ORDER — PHENYLEPHRINE HCL-NACL 20-0.9 MG/250ML-% IV SOLN
INTRAVENOUS | Status: DC | PRN
  Administered 2019-10-20: 50 ug/min via INTRAVENOUS

## 2019-10-20 MED ORDER — MIDAZOLAM HCL 2 MG/2ML IJ SOLN
INTRAMUSCULAR | Status: AC
  Filled 2019-10-20: qty 4

## 2019-10-20 MED ORDER — SUCCINYLCHOLINE CHLORIDE 200 MG/10ML IV SOSY
PREFILLED_SYRINGE | INTRAVENOUS | Status: DC | PRN
  Administered 2019-10-20: 100 mg via INTRAVENOUS

## 2019-10-20 MED ORDER — AMIODARONE HCL IN DEXTROSE 360-4.14 MG/200ML-% IV SOLN
60.0000 mg/h | INTRAVENOUS | Status: AC
  Administered 2019-10-20 (×2): 60 mg/h via INTRAVENOUS
  Filled 2019-10-20 (×2): qty 200

## 2019-10-20 MED ORDER — PHENYLEPHRINE HCL (PRESSORS) 10 MG/ML IV SOLN
INTRAVENOUS | Status: AC
  Filled 2019-10-20: qty 2

## 2019-10-20 MED ORDER — SODIUM CHLORIDE 0.9% FLUSH: 10.0000 mL | INTRAVENOUS | Status: DC | PRN

## 2019-10-20 MED ORDER — SUCCINYLCHOLINE CHLORIDE 200 MG/10ML IV SOSY
PREFILLED_SYRINGE | INTRAVENOUS | Status: AC
  Filled 2019-10-20: qty 10

## 2019-10-20 SURGICAL SUPPLY — 64 items
ADH SKN CLS APL DERMABOND .7 (GAUZE/BANDAGES/DRESSINGS)
APPLIER CLIP 5 13 M/L LIGAMAX5 (MISCELLANEOUS)
APPLIER CLIP ROT 10 11.4 M/L (STAPLE)
APR CLP MED LRG 11.4X10 (STAPLE)
APR CLP MED LRG 5 ANG JAW (MISCELLANEOUS)
BLADE EXTENDED COATED 6.5IN (ELECTRODE) IMPLANT
CABLE HIGH FREQUENCY MONO STRZ (ELECTRODE) ×1 IMPLANT
CELLS DAT CNTRL 66122 CELL SVR (MISCELLANEOUS) IMPLANT
CLIP APPLIE 5 13 M/L LIGAMAX5 (MISCELLANEOUS) IMPLANT
CLIP APPLIE ROT 10 11.4 M/L (STAPLE) IMPLANT
COUNTER NEEDLE 20 DBL MAG RED (NEEDLE) ×1 IMPLANT
COVER MAYO STAND STRL (DRAPES) ×7 IMPLANT
COVER WAND RF STERILE (DRAPES) IMPLANT
DECANTER SPIKE VIAL GLASS SM (MISCELLANEOUS) ×1 IMPLANT
DERMABOND ADVANCED (GAUZE/BANDAGES/DRESSINGS)
DERMABOND ADVANCED .7 DNX12 (GAUZE/BANDAGES/DRESSINGS) IMPLANT
DRAPE LAPAROSCOPIC ABDOMINAL (DRAPES) ×3 IMPLANT
DRSG OPSITE POSTOP 4X10 (GAUZE/BANDAGES/DRESSINGS) IMPLANT
DRSG OPSITE POSTOP 4X6 (GAUZE/BANDAGES/DRESSINGS) IMPLANT
DRSG OPSITE POSTOP 4X8 (GAUZE/BANDAGES/DRESSINGS) IMPLANT
DRSG PAD ABDOMINAL 8X10 ST (GAUZE/BANDAGES/DRESSINGS) ×4 IMPLANT
ELECT REM PT RETURN 15FT ADLT (MISCELLANEOUS) ×3 IMPLANT
GAUZE SPONGE 2X2 8PLY STRL LF (GAUZE/BANDAGES/DRESSINGS) IMPLANT
GAUZE SPONGE 4X4 12PLY STRL (GAUZE/BANDAGES/DRESSINGS) ×2 IMPLANT
GLOVE BIO SURGEON STRL SZ7.5 (GLOVE) ×6 IMPLANT
GOWN STRL REUS W/TWL XL LVL3 (GOWN DISPOSABLE) ×12 IMPLANT
HANDLE SUCTION POOLE (INSTRUMENTS) IMPLANT
KIT TURNOVER KIT A (KITS) IMPLANT
LEGGING LITHOTOMY PAIR STRL (DRAPES) ×1 IMPLANT
LIGASURE IMPACT 36 18CM CVD LR (INSTRUMENTS) ×2 IMPLANT
PACK COLON (CUSTOM PROCEDURE TRAY) ×1 IMPLANT
PACK GENERAL/GYN (CUSTOM PROCEDURE TRAY) ×2 IMPLANT
PENCIL SMOKE EVACUATOR (MISCELLANEOUS) ×2 IMPLANT
RETRACTOR WND ALEXIS 18 MED (MISCELLANEOUS) IMPLANT
RTRCTR WOUND ALEXIS 18CM MED (MISCELLANEOUS)
SCISSORS LAP 5X35 DISP (ENDOMECHANICALS) ×3 IMPLANT
SET IRRIG TUBING LAPAROSCOPIC (IRRIGATION / IRRIGATOR) ×1 IMPLANT
SET TUBE SMOKE EVAC HIGH FLOW (TUBING) ×3 IMPLANT
SHEARS HARMONIC ACE PLUS 36CM (ENDOMECHANICALS) ×1 IMPLANT
SLEEVE XCEL OPT CAN 5 100 (ENDOMECHANICALS) ×2 IMPLANT
SPONGE GAUZE 2X2 STER 10/PKG (GAUZE/BANDAGES/DRESSINGS)
STAPLER GUN LINEAR PROX 60 (STAPLE) ×2 IMPLANT
STAPLER PROXIMATE 75MM BLUE (STAPLE) ×2 IMPLANT
STAPLER VISISTAT 35W (STAPLE) ×2 IMPLANT
SUCTION POOLE HANDLE (INSTRUMENTS) ×3
SURGILUBE 2OZ TUBE FLIPTOP (MISCELLANEOUS) IMPLANT
SUT NOVA NAB DX-16 0-1 5-0 T12 (SUTURE) ×12 IMPLANT
SUT PDS AB 1 CTX 36 (SUTURE) IMPLANT
SUT PDS AB 1 TP1 96 (SUTURE) IMPLANT
SUT PROLENE 2 0 SH DA (SUTURE) ×1 IMPLANT
SUT SILK 2 0 (SUTURE) ×3
SUT SILK 2 0 SH CR/8 (SUTURE) ×5 IMPLANT
SUT SILK 2-0 18XBRD TIE 12 (SUTURE) ×1 IMPLANT
SUT SILK 3 0 (SUTURE) ×3
SUT SILK 3 0 SH CR/8 (SUTURE) ×3 IMPLANT
SUT SILK 3-0 18XBRD TIE 12 (SUTURE) ×1 IMPLANT
SUT VIC AB 3-0 SH 8-18 (SUTURE) ×2 IMPLANT
TAPE CLOTH SURG 6X10 WHT LF (GAUZE/BANDAGES/DRESSINGS) ×2 IMPLANT
TOWEL OR NON WOVEN STRL DISP B (DISPOSABLE) ×3 IMPLANT
TRAY FOLEY MTR SLVR 16FR STAT (SET/KITS/TRAYS/PACK) ×2 IMPLANT
TROCAR BLADELESS OPT 5 100 (ENDOMECHANICALS) ×1 IMPLANT
TROCAR XCEL NON-BLD 11X100MML (ENDOMECHANICALS) IMPLANT
TUBING CONNECTING 10 (TUBING) ×1 IMPLANT
TUBING CONNECTING 10' (TUBING) ×1

## 2019-10-20 NOTE — Progress Notes (Signed)
eLink Physician-Brief Progress Note Patient Name: [name] [name] DOB: 26-Sep-1932 MRN: [id number]   Date of Service  10/20/2019  HPI/Events of Note  Notified of hypotension post procedure, initially thought to be secondary to sedation post endoscopy, therapeutic flexsig for volvulus.  Hypotension persisted despite 4 liters crystalloids.  eICU Interventions   Abdomen reportedly not distended but somewhat tense  Discussed with Dr. Charleston Ropes that bowel perf will need to be ruled out. May start workup with xray  Phenylephrine ordered but BP currently at 115/86  HR 102. May need a central line if BP drops again and pressor started     Intervention Category Major Interventions: Hypotension - evaluation and management  [name] 10/20/2019, 4:24 AM

## 2019-10-20 NOTE — Progress Notes (Signed)
[name] [name] 9:05 AM  Subjective: Patient was better after the procedure last night but his abdominal exam has returned to being firm and he has no obvious complaints and agree with surgical note and case discussed with his nurse as well  Objective: Decreased blood pressure other vital signs okay abdomen is firm but no obvious rebound or tenderness x-ray is actually a little better white count okay potassium better BUN and creatinine stable slight decreased CO2 Assessment: Volvulus with probably ischemic versus necrotic bowel  Plan: Agree with surgical options please let me know if I can be of any further assistance this weekend going forward  [facility] E  office [phone] After 5PM or if no answer call [phone]

## 2019-10-20 NOTE — Progress Notes (Signed)
[facility] Progress Note Patient Name: [name] [name] DOB: [birth year] MRN: [id number]   Date of Service  10/20/2019  HPI/Events of Note  RN called aboutr clarifying maintnence fluid, do you want to continue NS with 40KCL at 125cc/hour. >>pt had explor lap done on a vent and no maintnence fluid running at present.  K 4.1. Cr ok. On amiodarone.   eICU Interventions  - DC current fluid. Start D5 NS at 75 ml/hr - ordered 5 am labs.      Intervention Category Intermediate Interventions: Other:  Elmer Sow 10/20/2019, 11:33 PM

## 2019-10-20 NOTE — Consult Note (Signed)
NAME:  [name] [name], MRN:  [id number], DOB:  1932/02/18, LOS: 1 ADMISSION DATE:  10/19/2019, CONSULTATION DATE:  10/20/2019 REFERRING MD:  Loann Quill MD, CHIEF COMPLAINT: Acute abdomen, shock  Brief History   85 year old admitted with abdominal pain, sigmoid volvulus.  Seen by surgery and GI Underwent sigmoidoscopy on 11/27.  On 11/28 become increasingly unstable with elevated lactic acid and shock.  PCCM consulted  Past Medical History  Parkinson's disease, mitral valve repair with history of endocarditis and mitral valve angioplasty in 2008, paroxysmal atrial fibrillation on Coumadin (previously on Eliquis but had inflammatory reaction), history of TIA, hyperlipidemia  Significant Hospital Events   11/27- Admit, underwent flex sig for decompression 11/28- Worsening shock and lactic acidosis  Consults:  Surgery, GI, PCCM  Procedures:    Significant Diagnostic Tests:  CT abdomen pelvis 10/19/2019-sigmoid volvulus with marked distention, bibasal lung opacities with small left effusion.  Micro Data:  Blood cultures 11/27-negative  Antimicrobials:  Vanco, cefepime 11/27 Zosyn 11/27 >>  Interim history/subjective:    Objective   Blood pressure 93/69, pulse 100, temperature 98.1 F (36.7 C), temperature source Axillary, resp. rate (!) 24, weight 69.3 kg, SpO2 91 %.        Intake/Output Summary (Last 24 hours) at 10/20/2019 7829 Last data filed at 10/20/2019 0500 Gross per 24 hour  Intake 2044.67 ml  Output 85 ml  Net 1959.67 ml   Filed Weights   10/20/19 0226  Weight: 69.3 kg    Examination: Gen:      Moderate distress, elderly gentleman moaning in bed HEENT:  EOMI, sclera anicteric Neck:     No masses; no thyromegaly Lungs:    Clear to auscultation bilaterally; normal respiratory effort CV:         Irregular, tachycardia Abd:      Firm distended abdomen with absent bowel sounds Ext:    No edema; adequate peripheral perfusion Skin:      Warm and dry; no rash  Neuro: Moves all extremities, unresponsive to command.  Resolved Hospital Problem list     Assessment & Plan:  Sigmoid volvulus, acute abdomen Underwent flex sigmoid decompression yesterday but continues to deteriorate with worsening shock, lactic acidosis He has been seen by surgery today morning and plan to take to the OR once INR is corrected with FFP Continue IV fluids, Zosyn  Bilateral aspiration pneumonia Covered with Zosyn.  Follow cultures  Atrial fibrillation, shock On peripheral neo at 30.  Will need central line if pressor requirements increase Off Cardizem due to hypotension May need amiodarone if his heart rate increases  Parkinsons Holding home medication  Best practice:  Diet: NPO Pain/Anxiety/Delirium protocol (if indicated): NA VAP protocol (if indicated): NA DVT prophylaxis: SCDs GI prophylaxis: Pepcid Glucose control: Monitor sugars Mobility: Bed Code Status: Full code Family Communication: Pending Disposition: ICU  Labs   CBC: Recent Labs  Lab 10/19/19 1400 10/20/19 0215  WBC 8.2 6.1  NEUTROABS 7.5  --   HGB 14.1 12.9*  HCT 43.8 40.0  MCV 101.2* 101.3*  PLT 175 562    Basic Metabolic Panel: Recent Labs  Lab 10/19/19 1400 10/19/19 2116 10/20/19 0215  NA 137  --  138  K 3.3*  --  4.1  CL 103  --  108  CO2 21*  --  19*  GLUCOSE 184*  --  106*  BUN 24*  --  27*  CREATININE 0.79  --  0.96  CALCIUM 8.3*  --  8.1*  MG  --  2.1 2.1   GFR: Estimated Creatinine Clearance: 54.4 mL/min (by C-G formula based on SCr of 0.96 mg/dL). Recent Labs  Lab 10/19/19 1400 10/19/19 2116 10/19/19 2350 10/20/19 0215 10/20/19 0450  WBC 8.2  --   --  6.1  --   LATICACIDVEN  --  4.9* 7.6*  --  3.4*    Liver Function Tests: Recent Labs  Lab 10/19/19 1400  AST 36  ALT 8  ALKPHOS 156*  BILITOT 1.5*  PROT 6.0*  ALBUMIN 3.1*   Recent Labs  Lab 10/19/19 1400  LIPASE 20   No results for input(s): AMMONIA in the last 168 hours.  ABG     Component Value Date/Time   TCO2 21 03/05/2015 2046     Coagulation Profile: Recent Labs  Lab 10/19/19 1922  INR 3.3*    Cardiac Enzymes: No results for input(s): CKTOTAL, CKMB, CKMBINDEX, TROPONINI in the last 168 hours.  HbA1C: Hgb A1c MFr Bld  Date/Time Value Ref Range Status  03/07/2015 06:23 AM 6.0 (H) 4.8 - 5.6 % Final    Comment:    (NOTE)         Pre-diabetes: 5.7 - 6.4         Diabetes: >6.4         Glycemic control for adults with diabetes: <7.0     CBG: Recent Labs  Lab 10/19/19 2113  GLUCAP 156*    Review of Systems:   Unable to obtain due to altered mental status  Past Medical History  He,  has a past medical history of Dyslipidemia, Parkinson's disease (HCC), S/P mitral valve replacement, and Stroke (HCC).   Surgical History    Past Surgical History:  Procedure Laterality Date  . CATARACT EXTRACTION  2009, 2012   right 2009, left 2012  . INTRAMEDULLARY (IM) NAIL INTERTROCHANTERIC Left 09/14/2019   Procedure: INTRAMEDULLARY (IM) NAIL INTERTROCHANTRIC;  Surgeon: Roby Lofts, MD;  Location: [facility] OR;  Service: Orthopedics;  Laterality: Left;  . LOOP RECORDER IMPLANT Left 03/13/2015   Procedure: LOOP RECORDER IMPLANT;  Surgeon: Marinus Maw, MD;  Location: [facility] CATH LAB;  Service: Cardiovascular;  Laterality: Left;  . MITRAL VALVE ANNULOPLASTY  04/10/2007    69mm Edwards ring; r/t h/o MR and flail mitral leaflets due to endocarditis: MRI safe  . TEE WITHOUT CARDIOVERSION N/A 03/13/2015   Procedure: TRANSESOPHAGEAL ECHOCARDIOGRAM (TEE);  Surgeon: Laurey Morale, MD;  Location: [facility] ENDOSCOPY;  Service: Cardiovascular;  Laterality: N/A;  . TRANSTHORACIC ECHOCARDIOGRAM  08/15/2012   EF=>55%; normal LV systolic function; RV mildly dilated & systolic function mildly reduced; RA mod dilated; mild -mod MR & mildy increased gradients; mild TR; AV mildly sclerotic with mild-mod regurg     Social History   reports that he has never smoked. He has never used  smokeless tobacco. He reports current alcohol use of about 2.0 standard drinks of alcohol per week. He reports that he does not use drugs.   Family History   His family history includes Heart attack in his father and mother; Hypertension in his brother; Prostate cancer in his father; Stroke in his mother; Valvular heart disease in his brother.   Allergies Allergies  Allergen Reactions  . Apixaban Palpitations, Rash and Other (See Comments)    Worsening tardive dyskinesia and rapid heart rate  . Ace Inhibitors Cough  . Demerol [Meperidine] Other (See Comments)    Parkinsons disease  . Poison Ivy Extract Itching, Swelling and Rash     Home Medications  Prior  to Admission medications   Medication Sig Start Date End Date Taking? Authorizing Provider  acetaminophen (TYLENOL) 325 MG tablet Take 2 tablets (650 mg total) by mouth every 6 (six) hours. Patient taking differently: Take 650 mg by mouth every 6 (six) hours as needed for mild pain.  09/21/19  Yes Calvert Cantorizwan, Saima, MD  amantadine (SYMMETREL) 100 MG capsule Take 100 mg by mouth See admin instructions. Take 100 mg by mouth at 1 PM and 100 mg at 6 PM   Yes [provider]  atorvastatin (LIPITOR) 20 MG tablet Take 20 mg by mouth every evening.  02/18/15  Yes [provider]  bisacodyl (DULCOLAX) 5 MG EC tablet Take 1 tablet (5 mg total) by mouth daily as needed for moderate constipation. 09/21/19  Yes Calvert Cantorizwan, Saima, MD  Cholecalciferol (VITAMIN D-3) 25 MCG (1000 UT) CAPS Take 1,000 Units by mouth daily.   Yes [provider]  clonazePAM (KLONOPIN) 0.25 MG disintegrating tablet Take 0.25 mg by mouth 2 (two) times daily. 10/09/19  Yes [provider]  docusate sodium (COLACE) 100 MG capsule Take 100 mg by mouth daily as needed for mild constipation.   Yes [provider]  donepezil (ARICEPT) 10 MG tablet Take 10 mg by mouth at bedtime.  08/02/19  Yes [provider]  escitalopram (LEXAPRO) 10  MG tablet Take 10 mg by mouth daily.   Yes [provider]  furosemide (LASIX) 20 MG tablet Take 0.5-1 tablets (10-20 mg total) by mouth See admin instructions. Take 10 mg by mouth once a day, alternating with 20 mg every other day Patient taking differently: Take 10 mg by mouth daily.  09/21/19 12/20/19 Yes Calvert Cantorizwan, Saima, MD  metoprolol succinate (TOPROL-XL) 25 MG 24 hr tablet Take 12.5 mg by mouth daily.   Yes [provider]  Multiple Vitamins-Minerals (MULTIVITAMIN PO) Take 1 tablet by mouth daily.    Yes [provider]  rotigotine (NEUPRO) 4 MG/24HR Place 1 patch onto the skin daily.   Yes [provider]  RYTARY 48.75-195 MG CPCR Take 1-3 capsules by mouth See admin instructions. Take 3 capsules by mouth in the morning, 3 capsules between noon-1 PM, 2 capsules at 6 PM, and 1 capsule at bedtime 02/18/15  Yes [provider]  selegiline (ELDEPRYL) 5 MG tablet Take 2.5-5 mg by mouth See admin instructions. Take 5 mg by mouth in the morning and 2.5 mg at 4 PM 06/15/19  Yes [provider]  vitamin B-12 (CYANOCOBALAMIN) 1000 MCG tablet Take 1,000 mcg by mouth 3 (three) times a week.    Yes [provider]  vitamin E 400 UNIT capsule Take 400 Units by mouth daily.   Yes [provider]  warfarin (COUMADIN) 5 MG tablet Take 1 tablet (5 mg total) by mouth daily. Patient taking differently: Take 2.5 mg by mouth at bedtime.  08/15/19  Yes Jodelle GrossLawrence, Kathryn M, NP     Critical care time: 3035   The patient is critically ill with multiple organ system failure and requires high complexity decision making for assessment and support, frequent evaluation and titration of therapies, advanced monitoring, review of radiographic studies and interpretation of complex data.   Critical Care Time devoted to patient care services, exclusive of separately billable procedures, described in this note is 35 minutes.   Chilton GreathousePraveen Amillia Biffle MD [facility] Pager [phone] If no answer call [phone] 10/20/2019, 7:12 AM

## 2019-10-20 NOTE — Anesthesia Preprocedure Evaluation (Addendum)
Anesthesia Evaluation  Patient identified by MRN, date of birth, ID band Patient confused    Reviewed: Allergy & Precautions, NPO status , Patient's Chart, lab work & pertinent test results, Unable to perform ROS - Chart review only  Airway Mallampati: II  TM Distance: >3 FB Neck ROM: Full    Dental no notable dental hx.    Pulmonary neg pulmonary ROS,    Pulmonary exam normal breath sounds clear to auscultation       Cardiovascular +CHF   Rhythm:Regular Rate:Normal + Systolic murmurs S/P MVR In comparison to the previous echocardiogram(s): 12/19/18 EF 60-65%. FINDINGS 08/30/2019  Left Ventricle: Left ventricular ejection fraction, by visual estimation, is 45 to 50%. The left ventricle has mildly decreased function. There is no left ventricular hypertrophy. Mildly increased left ventricular size. Spectral Doppler shows Left  ventricular diastolic Doppler parameters are indeterminate pattern of LV diastolic filling.  Right Ventricle: The right ventricular size is mildly enlarged. No increase in right ventricular wall thickness. Global RV systolic function is has moderately reduced systolic function.  Left Atrium: Left atrial size was severely dilated.  Right Atrium: Right atrial size was severely dilated  Pericardium: There is no evidence of pericardial effusion.  Mitral Valve: The mitral valve has been repaired/replaced. MV peak gradient, 20.8 mmHg. Severe mitral valve regurgitation. The patient is status post mitral valve repair.     Neuro/Psych Dementia Parkinsons DZ TIA Neuromuscular disease CVA    GI/Hepatic negative GI ROS, Neg liver ROS,   Endo/Other  negative endocrine ROS  Renal/GU negative Renal ROS  negative genitourinary   Musculoskeletal negative musculoskeletal ROS (+)   Abdominal   Peds negative pediatric ROS (+)  Hematology Coagulopathic on coumadin INR >3.0   Anesthesia Other Findings   Reproductive/Obstetrics negative OB ROS                            Anesthesia Physical Anesthesia Plan  ASA: IV  Anesthesia Plan: General   Post-op Pain Management:    Induction: Intravenous and Rapid sequence  PONV Risk Score and Plan: 2 and Ondansetron, Dexamethasone and Treatment may vary due to age or medical condition  Airway Management Planned: Oral ETT  Additional Equipment: Arterial line  Intra-op Plan:   Post-operative Plan: Post-operative intubation/ventilation  Informed Consent: I have reviewed the patients History and Physical, chart, labs and discussed the procedure including the risks, benefits and alternatives for the proposed anesthesia with the patient or authorized representative who has indicated his/her understanding and acceptance.     Dental advisory given  Plan Discussed with: CRNA and Surgeon  Anesthesia Plan Comments:         Anesthesia Quick Evaluation

## 2019-10-20 NOTE — Transfer of Care (Signed)
Immediate Anesthesia Transfer of Care Note  Patient: [name] [name]  Procedure(s) Performed: Procedure(s): sigmoid COLECTOMY WITH COLOSTOMY CREATION/ (N/A)  Patient Location: ICU  Anesthesia Type:General  Level of Consciousness:  sedated, patient cooperative and responds to stimulation  Airway & Oxygen Therapy:Patient Spontanous Breathing and Patient connected to face mask oxgen  Post-op Assessment:  Report given to PACU RN and Post -op Vital signs reviewed and stable  Post vital signs:  Reviewed and stable  Last Vitals:  Vitals:   10/20/19 1300 10/20/19 1335  BP: 112/88 103/84  Pulse: (!) 154 (!) 127  Resp: (!) 29 (!) 27  Temp:  36.6 C  SpO2: 329% 19%    Complications: No apparent anesthesia complications

## 2019-10-20 NOTE — Op Note (Signed)
Operative Note  Pre-operative Diagnosis: Sigmoid volvulus  Post-operative Diagnosis: Sigmoid volvulus with infarction, perforation, and feculent peritonitis  Surgeon:  Darnell Level, MD  Assistant: None  Procedure: Exploratory laparotomy, sigmoid colectomy, descending colostomy  Anesthesia: General per Dr. Eilene Ghazi  Estimated Blood Loss: Less than 100 cc  Drains: None         Specimen: Infarcted sigmoid colon to pathology  Indications: Patient is an 85 year old male admitted to the medical service and diagnosed with sigmoid volvulus.  An attempt was made at colonoscopic reduction.  Patient remained distended and septic and was therefore urgently prepared for the operating room.  Unfortunately this required reversal of anticoagulation with administration of fresh frozen plasma and vitamin K.  Once the INR had reached an acceptable level of 1.8, the patient was brought immediately to the operating room for exploration.  Procedure:  The patient was seen in the pre-op holding area. The risks, benefits, complications, treatment options, and expected outcomes were previously discussed with the patient. The patient agreed with the proposed plan and has signed the informed consent form.  The patient was brought to the operating room by the surgical team, identified as [name] and the procedure verified. A "time out" was completed and the above information confirmed.  An arterial line was placed by the anesthesia team and the patient was prepared for the operating room.  2 additional units of fresh frozen plasma were administered between the holding area in the operating room.  Following administration of general endotracheal anesthesia, the patient is positioned and then prepped and draped in the usual aseptic fashion.  After ascertaining that an adequate level of anesthesia been achieved, a midline abdominal incision is made of a #10 blade.  Dissection was carried through the fascia with the  electrocautery.  Peritoneum was incised cautiously and a large volume of feculent ascites emanated from the peritoneal cavity.  Wound was opened widely and the peritoneal cavity was evacuated of the feculent ascites.  The sigmoid colon extended from the left lower quadrant to the right upper quadrant anterior to the liver.  The entire sigmoid colon was completely infarcted and there were multiple sites of perforation.  Incision was extended cephalad.  Sigmoid colon was mobilized and the torsion was reduced allowing for visualization of the proximal sigmoid colon.  A point in the viable proximal sigmoid colon was selected and transected with a GIA stapler.  Mesentery was then divided using the LigaSure.  Great care was taken and dividing the mesentery due to the torsion on the mesentery and density of the tissue.  Tissue was divided in small increments with the LigaSure until the entire volvulized segment of sigmoid colon was completely mobilized.  Dissection was then carried distally behind the distal sigmoid colon and into the proximal rectal mesentery.  After reaching viable tissue, the proximal rectum was closed with a TA 60 stapler and the sigmoid colon was resected at the staple line.  The entire specimen was passed off the field and submitted to pathology for review.  Good hemostasis is noted along the line of the mesentery and along the staple line on the rectum.  Abdomen was then copiously irrigated with several liters of warm saline which was evacuated.  Small bowel was run from the ligament of Treitz to the ileocecal valve and appeared to have no injury and appeared viable.  A sending colon, transverse colon, and descending colon appeared viable.  An elliptical incision is made in the left mid abdominal wall.  Subcutaneous tissue is excised.  Abdominal wall was incised in a cruciate fashion and the peritoneal cavity is entered.  Using a Babcock clamp the proximal sigmoid colon staple line is  delivered through the abdominal wall.  It is secured to the rectus fascia with interrupted 2-0 silk sutures.  Nasogastric tube was placed by anesthesia.  Omentum is used to cover the small bowel.  Midline incision is closed with interrupted #1 Novafil simple sutures.  Wound is left open and will be packed with Betadine soaked Kerlix gauze sponges and then covered with 4 x 4 gauze sponges and then ABD pads.  Colostomy is matured by excising the staple line and maturing the bowel wall to the skin edges circumferentially with interrupted 3-0 Vicryl sutures.  A colostomy appliance is placed.  Patient will be transported critically ill back to the intensive care unit on the ventilator by the anesthesia team.  The patient's wife will be contacted by telephone with the operative findings and the patient's condition.   Armandina Gemma, MD [facility] Office: [phone]

## 2019-10-20 NOTE — Progress Notes (Signed)
Pt BP dropped to SBP 60-80s. Cardizem stopped. Communicated with Dr. Silas Sacramento throughout the shift about pt status. Total of 4L bolus given, albumin given, X-ray performed, and neosynephrine started. Communicated with CCM as well. BP has been stable with SBP in 90s and MAPs in 70s. Will continue to monitor.

## 2019-10-20 NOTE — Anesthesia Procedure Notes (Signed)
Procedure Name: Intubation Date/Time: 10/20/2019 4:44 PM Performed by: Claudia Desanctis, CRNA Pre-anesthesia Checklist: Patient identified, Emergency Drugs available, Suction available and Patient being monitored Patient Re-evaluated:Patient Re-evaluated prior to induction Oxygen Delivery Method: Circle system utilized Preoxygenation: Pre-oxygenation with 100% oxygen Induction Type: IV induction, Rapid sequence and Cricoid Pressure applied Laryngoscope Size: 2 and Miller Grade View: Grade I Tube type: Oral Tube size: 8.0 mm Number of attempts: 1 Airway Equipment and Method: Stylet Placement Confirmation: ETT inserted through vocal cords under direct vision,  positive ETCO2 and breath sounds checked- equal and bilateral Tube secured with: Tape Dental Injury: Teeth and Oropharynx as per pre-operative assessment

## 2019-10-20 NOTE — Progress Notes (Signed)
Chart reviewed.  Apparently patient became sicker overnight, hypotensive requiring vasopressors this morning.  PCCM was consulted for all of that.  Per general surgery note, patient would likely have surgery today and he will likely be intubated for at least a couple of days.  Discussed with Dr. Loanne Drilling of PCCM who agreed to take over patient's care as primary service.  Hospital service will sign off.

## 2019-10-20 NOTE — Progress Notes (Signed)
Paged by bedside RN regarding pts BP of 60/41 MAP of 47. Upon entering room pt in NAD, drowsy but arousable. HR irregular but 90's-110's. O2 sats were 100% on NRB.  Lactic Acid is 4.9 and pt has had no urine output with bladder scan resulting in 0 as well. Pt also underwent flex sig where he received 2mg  of Versed IV. Ordered a 1L NS bolus and will repeat lactic acid as well. BP improved with SBP in the 70's and MAP's in the 50's after bolus initiation. Will continue to bolus as long as BP responds. Low threshold to consult PCCM.   Arby Barrette AGPCNP-BC, AGNP-C [facility] Pager [phone]

## 2019-10-20 NOTE — Progress Notes (Addendum)
Patient ID: [name] [name], male   DOB: 1931-11-29, 85 y.o.   MRN: [id number]  1 Day Post-Op  Subjective: Agitated. On rebreather  Objective: Vital signs in last 24 hours: Temp:  [97.6 F (36.4 C)-99.8 F (37.7 C)] 98.1 F (36.7 C) (11/28 0415) Pulse Rate:  [38-157] 100 (11/28 0541) Resp:  [15-31] 24 (11/28 0600) BP: (60-132)/(39-104) 93/69 (11/28 0600) SpO2:  [78 %-100 %] 91 % (11/28 0541) Weight:  [69.3 kg] 69.3 kg (11/28 0226)    Intake/Output from previous day: 11/27 0701 - 11/28 0700 In: 2044.7 [I.V.:59.8; Blood:770.3; IV Piggyback:1214.6] Out: 85 [Urine:85] Intake/Output this shift: Total I/O In: 2044.7 [I.V.:59.8; Blood:770.3; IV Piggyback:1214.6] Out: 85 [Urine:85]  General appearance: delirious and moderate distress Resp: diminished breath sounds bilaterally Cardio: regular rate and rhythm GI: distended and tight  Lab Results:  Recent Labs    10/19/19 1400 10/20/19 0215  WBC 8.2 6.1  HGB 14.1 12.9*  HCT 43.8 40.0  PLT 175 150   BMET Recent Labs    10/19/19 1400 10/20/19 0215  NA 137 138  K 3.3* 4.1  CL 103 108  CO2 21* 19*  GLUCOSE 184* 106*  BUN 24* 27*  CREATININE 0.79 0.96  CALCIUM 8.3* 8.1*   PT/INR Recent Labs    10/19/19 1922  LABPROT 33.3*  INR 3.3*   ABG No results for input(s): PHART, HCO3 in the last 72 hours.  Invalid input(s): PCO2, PO2  Studies/Results: Dg Abd 1 View  Result Date: 10/20/2019 CLINICAL DATA:  Volvulus of colon. EXAM: ABDOMEN - 1 VIEW COMPARISON:  CT abdomen dated 10/19/2019. FINDINGS: Persistent gaseous distention of the distal colon, but at least mildly improved compared to the dilatation demonstrated on yesterday's abdomen and pelvis CT. Stool is present within the more proximal colon. No evidence of free intraperitoneal air is seen, although characterization is limited by the supine patient positioning. IMPRESSION: 1. Persistent gaseous distention of the sigmoid colon, but at least mildly improved compared to  the dilatation demonstrated on yesterday's CT status post interval flexible sigmoidoscopy. 2. No obvious evidence of free intraperitoneal air, but characterization is limited by supine patient positioning and incomplete visualization of the upper abdomen. Electronically Signed   By: Franki Cabot M.D.   On: 10/20/2019 05:01   Ct Abdomen Pelvis W Contrast  Result Date: 10/19/2019 CLINICAL DATA:  Abdominal pain that started around 6 o'clock today. EXAM: CT ABDOMEN AND PELVIS WITH CONTRAST TECHNIQUE: Multidetector CT imaging of the abdomen and pelvis was performed using the standard protocol following bolus administration of intravenous contrast. CONTRAST:  131mL OMNIPAQUE IOHEXOL 300 MG/ML  SOLN COMPARISON:  None FINDINGS: Lower chest: Signs of mitral valve replacement. Cardiac enlargement. Heart is incompletely imaged. Basilar airspace disease with moderate right and small left pleural effusion. Hepatobiliary: Limited assessment due to arm position, technical factors and edema, no gross abnormality. Pancreas: Also with some limited assessment on today's study. Grossly normal. Spleen: Normal in size without focal abnormality. Adrenals/Urinary Tract: Normal adrenal glands. Marked renal cortical scarring right worse than left. No signs of hydronephrosis. Small left renal cyst. Stomach/Bowel: Bowel is difficult to follow throughout the abdomen. The colon is markedly distended, particularly the sigmoid colon. There is a twist in the sigmoid colon with configuration that is compatible with sigmoid volvulus. Bowel edema is present. No signs of pneumatosis at the current time. Small volume ascites. Other bowel loops are difficult to follow as described. Vascular/Lymphatic: Patent abdominal vasculature. No signs of adenopathy in the abdomen or pelvis. Reproductive: Limited  assessment of the prostate and pelvic structures secondary to streak artifact from previous ORIF of a left proximal femur fracture. Other: Bowel  edema. No current signs of free air. Small volume ascites. Musculoskeletal: No signs of acute or destructive bone process. Postoperative changes related to intertrochanteric fracture ORIF with femoral nailing and hip screw placement partially visualized. Osteopenia. IMPRESSION: 1. Signs of sigmoid volvulus with marked distension of the sigmoid colon. Small volume ascites, no free air or pneumatosis. Surgical and or GI consultation is suggested for consideration of reduction. 2. Basilar airspace disease with moderate effusion, raise the question of pneumonia or aspiration at the right lung base. Small left effusion as well. 3. Cardiac enlargement. 4. Marked renal cortical scarring right worse than left. 5. Osteopenia. 6. Postoperative changes related to previous ORIF of a left proximal femur fracture. 7. These results were called by telephone at the time of interpretation on 10/19/2019 at 3:34 pm to provider [facility] , who verbally acknowledged these results. Electronically Signed   By: Donzetta Kohut M.D.   On: 10/19/2019 15:36   Dg Chest Portable 1 View  Result Date: 10/19/2019 CLINICAL DATA:  Possible pneumonia on CT EXAM: PORTABLE CHEST 1 VIEW COMPARISON:  Portable exam 1603 hours compared to 03/05/2015 FINDINGS: Enlargement of cardiac silhouette post MVR. Loop recorder projects over chest. Stable mediastinal contours. Bibasilar infiltrates greater on RIGHT consistent with pneumonia. No pleural effusion or pneumothorax. Bowel interposition between liver and diaphragm. Bones demineralized with chronic LEFT rotator cuff tear. IMPRESSION: Enlargement of cardiac silhouette post MVR. Bibasilar infiltrate particularly on RIGHT consistent with pneumonia. Electronically Signed   By: Ulyses Southward M.D.   On: 10/19/2019 16:12    Anti-infectives: Anti-infectives (From admission, onward)   Start     Dose/Rate Route Frequency Ordered Stop   10/19/19 2200  piperacillin-tazobactam (ZOSYN) IVPB 3.375 g     3.375  g 12.5 mL/hr over 240 Minutes Intravenous Every 8 hours 10/19/19 1802     10/19/19 1700  vancomycin (VANCOCIN) 1,500 mg in sodium chloride 0.9 % 500 mL IVPB     1,500 mg 250 mL/hr over 120 Minutes Intravenous  Once 10/19/19 1636 10/19/19 1958   10/19/19 1630  ceFEPIme (MAXIPIME) 2 g in sodium chloride 0.9 % 100 mL IVPB     2 g 200 mL/hr over 30 Minutes Intravenous  Once 10/19/19 1629 10/19/19 1911      Assessment/Plan: s/p Procedure(s): FLEXIBLE SIGMOIDOSCOPY Although he was decompressed last night, he appears to still have the volvulus. I suspect he will need surgery today for colectomy and colostomy. He will likely remain on vent after surgery. He will need coags corrected prior to surgery. I have discussed all this in detail with his wife and she understands and wishes to proceed  LOS: 1 day    Chevis Pretty III 10/20/2019

## 2019-10-20 NOTE — Procedures (Signed)
Central Venous Catheter Insertion Procedure Note [name] [name] [id number] Aug 25, 1932  Procedure: Insertion of Central Venous Catheter Indications: Drug and/or fluid administration and Frequent blood sampling  Procedure Details Consent: Risks of procedure as well as the alternatives and risks of each were explained to the (patient/caregiver).  Consent for procedure obtained. Time Out: Verified patient identification, verified procedure, site/side was marked, verified correct patient position, special equipment/implants available, medications/allergies/relevent history reviewed, required imaging and test results available.  Performed  Maximum sterile technique was used including antiseptics, cap, gloves, gown, hand hygiene, mask and sheet. Skin prep: ChloraPrep; local anesthetic administered A antimicrobial bonded/coated triple lumen catheter was placed in the left subclavian vein using the Seldinger technique.  Evaluation Blood flow good Complications: No apparent complications Patient did tolerate procedure well. Chest X-ray ordered to verify placement.  CXR: pending.  Maryjane Hurter 10/20/2019, 8:21 PM

## 2019-10-21 DIAGNOSIS — J9601 Acute respiratory failure with hypoxia: Secondary | ICD-10-CM

## 2019-10-21 DIAGNOSIS — K659 Peritonitis, unspecified: Secondary | ICD-10-CM

## 2019-10-21 DIAGNOSIS — R6521 Severe sepsis with septic shock: Secondary | ICD-10-CM

## 2019-10-21 DIAGNOSIS — G20 Parkinson's disease: Secondary | ICD-10-CM

## 2019-10-21 DIAGNOSIS — A419 Sepsis, unspecified organism: Secondary | ICD-10-CM | POA: Diagnosis not present

## 2019-10-21 DIAGNOSIS — K562 Volvulus: Secondary | ICD-10-CM | POA: Diagnosis not present

## 2019-10-21 LAB — BASIC METABOLIC PANEL
Anion gap: 9 (ref 5–15)
BUN: 37 mg/dL — ABNORMAL HIGH (ref 8–23)
CO2: 20 mmol/L — ABNORMAL LOW (ref 22–32)
Calcium: 7.7 mg/dL — ABNORMAL LOW (ref 8.9–10.3)
Chloride: 113 mmol/L — ABNORMAL HIGH (ref 98–111)
Creatinine, Ser: 1.25 mg/dL — ABNORMAL HIGH (ref 0.61–1.24)
GFR calc Af Amer: >60 mL/min (ref >60)
GFR calc non Af Amer: 52 mL/min — ABNORMAL LOW (ref >60)
Glucose, Bld: 116 mg/dL — ABNORMAL HIGH (ref 70–99)
Potassium: 4.1 mmol/L (ref 3.5–5.1)
Sodium: 142 mmol/L (ref 135–145)

## 2019-10-21 LAB — BLOOD GAS, ARTERIAL
Acid-base deficit: 2.8 mmol/L — ABNORMAL HIGH (ref 0.0–2.0)
Bicarbonate: 20.3 mmol/L (ref 20.0–28.0)
FIO2: 80
O2 Saturation: 99.8 %
Patient temperature: 98.6
pCO2 arterial: 31.4 mmHg — ABNORMAL LOW (ref 32.0–48.0)
pH, Arterial: 7.426 (ref 7.350–7.450)
pO2, Arterial: 175 mmHg — ABNORMAL HIGH (ref 83.0–108.0)

## 2019-10-21 LAB — CBC
HCT: 36.8 % — ABNORMAL LOW (ref 39.0–52.0)
Hemoglobin: 12.2 g/dL — ABNORMAL LOW (ref 13.0–17.0)
MCH: 33.6 pg (ref 26.0–34.0)
MCHC: 33.2 g/dL (ref 30.0–36.0)
MCV: 101.4 fL — ABNORMAL HIGH (ref 80.0–100.0)
Platelets: 140 10*3/uL — ABNORMAL LOW (ref 150–400)
RBC: 3.63 MIL/uL — ABNORMAL LOW (ref 4.22–5.81)
RDW: 17 % — ABNORMAL HIGH (ref 11.5–15.5)
WBC: 4.2 10*3/uL (ref 4.0–10.5)
nRBC: 0 % (ref 0.0–0.2)

## 2019-10-21 LAB — MAGNESIUM: Magnesium: 1.8 mg/dL (ref 1.7–2.4)

## 2019-10-21 LAB — TRIGLYCERIDES: Triglycerides: 67 mg/dL (ref <150)

## 2019-10-21 LAB — LEGIONELLA PNEUMOPHILA SEROGP 1 UR AG: L. pneumophila Serogp 1 Ur Ag: NEGATIVE

## 2019-10-21 MED ORDER — AMIODARONE LOAD VIA INFUSION
150.0000 mg | Freq: Once | INTRAVENOUS | Status: AC
  Administered 2019-10-21: 150 mg via INTRAVENOUS

## 2019-10-21 MED ORDER — CHLORHEXIDINE GLUCONATE 0.12% ORAL RINSE (MEDLINE KIT)
15.0000 mL | Freq: Two times a day (BID) | OROMUCOSAL | Status: DC
  Administered 2019-10-21 – 2019-11-03 (×28): 15 mL via OROMUCOSAL

## 2019-10-21 MED ORDER — ORAL CARE MOUTH RINSE
15.0000 mL | OROMUCOSAL | Status: DC
  Administered 2019-10-21 – 2019-10-31 (×90): 15 mL via OROMUCOSAL

## 2019-10-21 MED ORDER — SELEGILINE HCL 5 MG PO TABS: 5.0000 mg | ORAL_TABLET | Freq: Every day | ORAL | Status: DC

## 2019-10-21 MED ORDER — SELEGILINE HCL 5 MG PO TABS: 2.5000 mg | ORAL_TABLET | Freq: Every day | ORAL | Status: DC

## 2019-10-21 MED ORDER — LACTATED RINGERS IV BOLUS
500.0000 mL | Freq: Once | INTRAVENOUS | Status: AC
  Administered 2019-10-21: 500 mL via INTRAVENOUS

## 2019-10-21 MED ORDER — AMANTADINE HCL 50 MG/5ML PO SYRP: 100.0000 mg | ORAL_SOLUTION | Freq: Two times a day (BID) | ORAL | Status: DC

## 2019-10-21 NOTE — Progress Notes (Signed)
Initial Nutrition Assessment  INTERVENTION:   -If pt to remain intubated and require bowel rest for >48 hours, recommend initiation of nutrition support. -Will continue to monitor plan of care  NUTRITION DIAGNOSIS:   Inadequate oral intake related to inability to eat as evidenced by NPO status.  GOAL:   Patient will meet greater than or equal to 90% of their needs  MONITOR:   Vent status, Labs, Weight trends, I & O's  REASON FOR ASSESSMENT:   Ventilator    ASSESSMENT:   85 year old admitted with abdominal pain, sigmoid volvulus.  Seen by surgery and GIUnderwent sigmoidoscopy on 11/27.  On 11/28 become increasingly unstable with elevated lactic acid and shock.  11/26: intubated 11/27: s/p sigmoidoscopy 11/28: s/p ex lap, sigmoid colectomy, colostomy creation  Patient is currently intubated on ventilator support MV: 9.8 L/min Temp (24hrs), Avg:98.2 F (36.8 C), Min:97.6 F (36.4 C), Max:98.7 F (37.1 C)  Per surgery note, pt to continue on bowel rest with NGT set to suction.  Per chart review, pt was eating well the day PTA.   Per weight records, pt has lost 27 lbs since 9/23 (15% wt loss x 2 months, significant for time frame).  I/Os:+9L since admit UOP: 275 ml x 24 hrs Ostomy: 150 ml  Propofol: 14.55 ml/hr -provides ~384 fat kcals  Medications: D5 infusion, Lactated Ringers bolus, Levophed infusion Labs reviewed: TG: 67   NUTRITION - FOCUSED PHYSICAL EXAM:  Working remotely.  Diet Order:   Diet Order            Diet NPO time specified  Diet effective now              EDUCATION NEEDS:   No education needs have been identified at this time  Skin:  Skin Assessment: Reviewed RN Assessment  Last BM:  11/29- type 6  Height:   Ht Readings from Last 1 Encounters:  10/20/19 5\' 8"  (1.727 m)    Weight:   Wt Readings from Last 1 Encounters:  10/21/19 69.3 kg    Ideal Body Weight:  70 kg  BMI:  Body mass index is 23.23 kg/m.  Estimated  Nutritional Needs:   Kcal:  1660  Protein:  100-110g  Fluid:  1.6L/day  Clayton Bibles, MS, RD, LDN Inpatient Clinical Dietitian Pager: [phone] After Hours Pager: [phone]

## 2019-10-21 NOTE — Consult Note (Signed)
NAME:  [name] [name], MRN:  [id number], DOB:  Nov 03, 1932, LOS: 2 ADMISSION DATE:  10/19/2019, CONSULTATION DATE:  10/20/2019 REFERRING MD:  Estill Cotta MD, CHIEF COMPLAINT: Acute abdomen, shock  Brief History   85 year old admitted with abdominal pain, sigmoid volvulus.  Seen by surgery and GI Underwent sigmoidoscopy on 11/27.  On 11/28 become increasingly unstable with elevated lactic acid and shock.  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 Failed decompression and went to OR for ex-lap, sigmoid colectomy and colostomy creation. Returned to ICU vented 11/29 - Weaning vent settings  Consults:  Surgery, GI, PCCM  Procedures:    Significant Diagnostic Tests:  CT abdomen pelvis 10/19/2019-sigmoid volvulus with marked distention, bibasal lung opacities with small left effusion.  Micro Data:  Blood cultures 11/27-negative  Antimicrobials:  Vanco, cefepime 11/27 Zosyn 11/27 >>  Interim history/subjective:  S/p ex-lap with sigmoid colectomy for infarcted bowel with colostomy creation. Returned to ICU floor vented. CVC placed for central access last night. This morning FIO2 weaned to 70%  Objective   Blood pressure 116/66, pulse (!) 140, temperature 97.6 F (36.4 C), temperature source Axillary, resp. rate 18, height 5\' 8"  (1.727 m), weight 69.3 kg, SpO2 100 %.    Vent Mode: PRVC FiO2 (%):  [70 %-100 %] 70 % Set Rate:  [18 bmp] 18 bmp Vt Set:  [540 mL] 540 mL PEEP:  [5 cmH20-8 cmH20] 8 cmH20 Plateau Pressure:  [17 cmH20-23 cmH20] 23 cmH20   Intake/Output Summary (Last 24 hours) at 10/21/2019 1155 Last data filed at 10/21/2019  1100 Gross per 24 hour  Intake 4455.47 ml  Output 445 ml  Net 4010.47 ml   Filed Weights   10/20/19 0226 10/21/19 0500  Weight: 69.3 kg 69.3 kg   Physical Exam: General: Well-appearing, sedated on mechanical ventilation HENT: [place], AT, ETT in place Eyes: EOMI, no scleral icterus Respiratory: Clear to auscultation bilaterally.  No crackles, wheezing or rales Cardiovascular: RRR, -M/R/G, no JVD GI: Soft, nontender, midline wound dressing in place, LLQ colostomy Extremities:-Edema,-tenderness Neuro: Sedated Skin: Punctate arterial ulcerations of different stages in lower extremities bilaterally GU: Foley in place  Resolved Hospital Problem list     Assessment & Plan:  Sigmoid volvulus with infarction, perforation and peritonitis s/p sigmoid colectomy and colostomy creation Plan: Post-op care Surgery. Continue NGT and bowel rest Zosyn D5 gtt  Septic shock secondary to perforated bowel Plan: Wean levophed for goal MAP >65 Give 500cc LR bolus now Continue Zosyn  Follow-up cultures  Acute hypoxemic respiratory failure secondary aspiration pna Plan: Full vent support Wean PEEP/FIO2  Atrial fibrillation with RVR Amiodarone gtt  Parkinsons Restarting home meds  Best practice:  Diet: NPO, D5 gtt Pain/Anxiety/Delirium protocol (if indicated): Propofol VAP protocol (if indicated): Yes DVT prophylaxis: SCDs GI prophylaxis: Pepcid Glucose control: Monitor sugars Mobility: Bed Code Status: Full code Family Communication: Update at wife at 11/29 Disposition: ICU  Labs   CBC: Recent Labs  Lab 10/19/19 1400 10/20/19 0215 10/20/19 1700 10/20/19 1746 10/21/19 0500  WBC 8.2 6.1  --   --  4.2  NEUTROABS 7.5  --   --   --   --   HGB 14.1 12.9* 12.6* 12.2* 12.2*  HCT 43.8  40.0 37.0* 36.0* 36.8*  MCV 101.2* 101.3*  --   --  101.4*  PLT 175 150  --   --  140*    Basic Metabolic Panel: Recent Labs  Lab 10/19/19 1400 10/19/19 2116 10/20/19 0215 10/20/19 1700  10/20/19 1746 10/21/19 0500  NA 137  --  138 144 145 142  K 3.3*  --  4.1 4.8 4.4 4.1  CL 103  --  108  --   --  113*  CO2 21*  --  19*  --   --  20*  GLUCOSE 184*  --  106*  --   --  116*  BUN 24*  --  27*  --   --  37*  CREATININE 0.79  --  0.96  --   --  1.25*  CALCIUM 8.3*  --  8.1*  --   --  7.7*  MG  --  2.1 2.1  --   --  1.8   GFR: Estimated Creatinine Clearance: 41.8 mL/min (A) (by C-G formula based on SCr of 1.25 mg/dL (H)). Recent Labs  Lab 10/19/19 1400 10/19/19 2116 10/19/19 2350 10/20/19 0215 10/20/19 0450 10/20/19 0622 10/21/19 0500  WBC 8.2  --   --  6.1  --   --  4.2  LATICACIDVEN  --  4.9* 7.6*  --  3.4* 3.3*  --     Liver Function Tests: Recent Labs  Lab 10/19/19 1400  AST 36  ALT 8  ALKPHOS 156*  BILITOT 1.5*  PROT 6.0*  ALBUMIN 3.1*   Recent Labs  Lab 10/19/19 1400  LIPASE 20   No results for input(s): AMMONIA in the last 168 hours.  ABG    Component Value Date/Time   PHART 7.426 10/21/2019 1016   PCO2ART 31.4 (L) 10/21/2019 1016   PO2ART 175 (H) 10/21/2019 1016   HCO3 20.3 10/21/2019 1016   TCO2 20 (L) 10/20/2019 1746   ACIDBASEDEF 2.8 (H) 10/21/2019 1016   O2SAT 99.8 10/21/2019 1016     Coagulation Profile: Recent Labs  Lab 10/19/19 1922 10/20/19 0625 10/20/19 1402  INR 3.3* 3.4* 1.8*    Cardiac Enzymes: No results for input(s): CKTOTAL, CKMB, CKMBINDEX, TROPONINI in the last 168 hours.  HbA1C: Hgb A1c MFr Bld  Date/Time Value Ref Range Status  03/07/2015 06:23 AM 6.0 (H) 4.8 - 5.6 % Final    Comment:    (NOTE)         Pre-diabetes: 5.7 - 6.4         Diabetes: >6.4         Glycemic control for adults with diabetes: <7.0     CBG: Recent Labs  Lab 10/19/19 2113  GLUCAP 156*   Critical care time: 38 min   The patient is critically ill with multiple organ systems failure and requires high complexity decision making for assessment and support, frequent evaluation and titration of therapies, application of  advanced monitoring technologies and extensive interpretation of multiple databases.   Rodman Pickle, M.D. [facility] Pulmonary/Critical Care Medicine 10/21/2019 11:55 AM   Please see Amion for pager number to reach on-call Pulmonary and Critical Care Team.

## 2019-10-21 NOTE — Progress Notes (Signed)
Surgical findings noted discussed case with nurse is having a little bright red blood from his rectum but not in his bag and no other specific complaints labs surprisingly okay please let us know if we could be of any further assistance with this hospital stay

## 2019-10-21 NOTE — Anesthesia Postprocedure Evaluation (Signed)
Anesthesia Post Note  Patient: [name] [name]  Procedure(s) Performed: sigmoid COLECTOMY WITH COLOSTOMY CREATION/ (N/A Abdomen)     Patient location during evaluation: SICU Anesthesia Type: General Level of consciousness: sedated Pain management: pain level controlled Vital Signs Assessment: post-procedure vital signs reviewed and stable Respiratory status: patient remains intubated per anesthesia plan Cardiovascular status: stable Postop Assessment: no apparent nausea or vomiting Anesthetic complications: no    Last Vitals:  Vitals:   10/21/19 0015 10/21/19 0030  BP:    Pulse:    Resp:  18  Temp: 36.9 C   SpO2:      Last Pain:  Vitals:   10/21/19 0015  TempSrc: Oral  PainSc:                  Leimomi Zervas S

## 2019-10-21 NOTE — Progress Notes (Signed)
Patient ID: [name] HensenJohn [name], male   DOB: 05/25/1932, 85 y.o.   MRN: [id number]  1 Day Post-Op  Subjective: Sedated on vent  Objective: Vital signs in last 24 hours: Temp:  [97.6 F (36.4 C)-98.7 F (37.1 C)] 97.6 F (36.4 C) (11/29 0800) Pulse Rate:  [25-154] 130 (11/29 0820) Resp:  [18-34] 18 (11/29 0820) BP: (83-124)/(28-95) 108/59 (11/29 0820) SpO2:  [81 %-100 %] 100 % (11/29 0820) Arterial Line BP: (83-130)/(50-120) 112/59 (11/29 0800) FiO2 (%):  [80 %-100 %] 80 % (11/29 0820) Weight:  [69.3 kg] 69.3 kg (11/29 0500)    Intake/Output from previous day: 11/28 0701 - 11/29 0700 In: 6847.8 [I.V.:3673.2; Blood:1724; IV Piggyback:1450.7] Out: 445 [Urine:275; Stool:150; Blood:20] Intake/Output this shift: No intake/output data recorded.  General appearance: sedated on vent Resp: clear to auscultation bilaterally and on vent Cardio: irregularly irregular rhythm GI: softer, dressing clean  Lab Results:  Recent Labs    10/20/19 0215  10/20/19 1746 10/21/19 0500  WBC 6.1  --   --  4.2  HGB 12.9*   < > 12.2* 12.2*  HCT 40.0   < > 36.0* 36.8*  PLT 150  --   --  140*   < > = values in this interval not displayed.   BMET Recent Labs    10/20/19 0215  10/20/19 1746 10/21/19 0500  NA 138   < > 145 142  K 4.1   < > 4.4 4.1  CL 108  --   --  113*  CO2 19*  --   --  20*  GLUCOSE 106*  --   --  116*  BUN 27*  --   --  37*  CREATININE 0.96  --   --  1.25*  CALCIUM 8.1*  --   --  7.7*   < > = values in this interval not displayed.   PT/INR Recent Labs    10/20/19 0625 10/20/19 1402  LABPROT 34.0* 20.6*  INR 3.4* 1.8*   ABG Recent Labs    10/20/19 1746 10/20/19 1919  PHART 7.247* 7.313*  HCO3 18.6* 18.1*    Studies/Results: Dg Abd 1 View  Result Date: 10/20/2019 CLINICAL DATA:  OG tube placement EXAM: ABDOMEN - 1 VIEW COMPARISON:  10/20/2019 FINDINGS: OG tube tip is in the proximal stomach near the GE junction. The side port is in the distal esophagus. Dilated  bowel loops in the upper abdomen, decreased since prior study. IMPRESSION: OG tube tip in the proximal stomach near the GE junction with the side port in the distal esophagus. Electronically Signed   By: Charlett NoseKevin  Dover M.D.   On: 10/20/2019 20:56   Dg Abd 1 View  Result Date: 10/20/2019 CLINICAL DATA:  Volvulus of colon. EXAM: ABDOMEN - 1 VIEW COMPARISON:  CT abdomen dated 10/19/2019. FINDINGS: Persistent gaseous distention of the distal colon, but at least mildly improved compared to the dilatation demonstrated on yesterday's abdomen and pelvis CT. Stool is present within the more proximal colon. No evidence of free intraperitoneal air is seen, although characterization is limited by the supine patient positioning. IMPRESSION: 1. Persistent gaseous distention of the sigmoid colon, but at least mildly improved compared to the dilatation demonstrated on yesterday's CT status post interval flexible sigmoidoscopy. 2. No obvious evidence of free intraperitoneal air, but characterization is limited by supine patient positioning and incomplete visualization of the upper abdomen. Electronically Signed   By: Bary RichardStan  Maynard M.D.   On: 10/20/2019 05:01   Ct Abdomen Pelvis W Contrast  Result Date: 10/19/2019 CLINICAL DATA:  Abdominal pain that started around 6 o'clock today. EXAM: CT ABDOMEN AND PELVIS WITH CONTRAST TECHNIQUE: Multidetector CT imaging of the abdomen and pelvis was performed using the standard protocol following bolus administration of intravenous contrast. CONTRAST:  126mL OMNIPAQUE IOHEXOL 300 MG/ML  SOLN COMPARISON:  None FINDINGS: Lower chest: Signs of mitral valve replacement. Cardiac enlargement. Heart is incompletely imaged. Basilar airspace disease with moderate right and small left pleural effusion. Hepatobiliary: Limited assessment due to arm position, technical factors and edema, no gross abnormality. Pancreas: Also with some limited assessment on today's study. Grossly normal. Spleen: Normal  in size without focal abnormality. Adrenals/Urinary Tract: Normal adrenal glands. Marked renal cortical scarring right worse than left. No signs of hydronephrosis. Small left renal cyst. Stomach/Bowel: Bowel is difficult to follow throughout the abdomen. The colon is markedly distended, particularly the sigmoid colon. There is a twist in the sigmoid colon with configuration that is compatible with sigmoid volvulus. Bowel edema is present. No signs of pneumatosis at the current time. Small volume ascites. Other bowel loops are difficult to follow as described. Vascular/Lymphatic: Patent abdominal vasculature. No signs of adenopathy in the abdomen or pelvis. Reproductive: Limited assessment of the prostate and pelvic structures secondary to streak artifact from previous ORIF of a left proximal femur fracture. Other: Bowel edema. No current signs of free air. Small volume ascites. Musculoskeletal: No signs of acute or destructive bone process. Postoperative changes related to intertrochanteric fracture ORIF with femoral nailing and hip screw placement partially visualized. Osteopenia. IMPRESSION: 1. Signs of sigmoid volvulus with marked distension of the sigmoid colon. Small volume ascites, no free air or pneumatosis. Surgical and or GI consultation is suggested for consideration of reduction. 2. Basilar airspace disease with moderate effusion, raise the question of pneumonia or aspiration at the right lung base. Small left effusion as well. 3. Cardiac enlargement. 4. Marked renal cortical scarring right worse than left. 5. Osteopenia. 6. Postoperative changes related to previous ORIF of a left proximal femur fracture. 7. These results were called by telephone at the time of interpretation on 10/19/2019 at 3:34 pm to provider [facility] , who verbally acknowledged these results. Electronically Signed   By: Zetta Bills M.D.   On: 10/19/2019 15:36   Dg Chest Port 1 View  Result Date: 10/20/2019 CLINICAL DATA:   Central line placement EXAM: PORTABLE CHEST 1 VIEW COMPARISON:  Radiograph 10/19/2019 FINDINGS: Endotracheal tube appropriately positioned within the mid trachea, 4.4 cm from the carina. Transesophageal tube tip is positioned at the level of the GE junction. Should be advanced at least 10 cm to position the side port beyond the GE junction for optimal functioning. Left subclavian line terminates in the brachiocephalic vein. Loop recorder again projects over the heart. Post sternotomy changes with evidence of mitral valve replacement with stable cardiomegaly. Bibasilar areas of consolidative opacity more pronounced in the right lung base. Obscuration of the right hemidiaphragm likely reflecting a layering effusion. Degenerative changes are present in the imaged spine and shoulders. No acute osseous or soft tissue abnormality. IMPRESSION: 1. Transesophageal tube tip is positioned at the level of the GE junction. Should be advanced at least 10 cm to position the GE junction for optimal functioning. 2. Satisfactory positioning of the endotracheal tube in the mid trachea. 3. Left subclavian line terminates in the brachiocephalic vein, consider advancing centrally. 4. Bibasilar areas of consolidative opacity, more pronounced in the right lung base, consistent with pneumonia. 5. Likely layering right effusion. These  results will be called to the ordering clinician or representative by the Radiologist Assistant, and communication documented in the PACS or zVision Dashboard. Electronically Signed   By: Kreg Shropshire M.D.   On: 10/20/2019 21:00   Dg Chest Portable 1 View  Result Date: 10/19/2019 CLINICAL DATA:  Possible pneumonia on CT EXAM: PORTABLE CHEST 1 VIEW COMPARISON:  Portable exam 1603 hours compared to 03/05/2015 FINDINGS: Enlargement of cardiac silhouette post MVR. Loop recorder projects over chest. Stable mediastinal contours. Bibasilar infiltrates greater on RIGHT consistent with pneumonia. No pleural  effusion or pneumothorax. Bowel interposition between liver and diaphragm. Bones demineralized with chronic LEFT rotator cuff tear. IMPRESSION: Enlargement of cardiac silhouette post MVR. Bibasilar infiltrate particularly on RIGHT consistent with pneumonia. Electronically Signed   By: Ulyses Southward M.D.   On: 10/19/2019 16:12    Anti-infectives: Anti-infectives (From admission, onward)   Start     Dose/Rate Route Frequency Ordered Stop   10/19/19 2200  piperacillin-tazobactam (ZOSYN) IVPB 3.375 g     3.375 g 12.5 mL/hr over 240 Minutes Intravenous Every 8 hours 10/19/19 1802     10/19/19 1700  vancomycin (VANCOCIN) 1,500 mg in sodium chloride 0.9 % 500 mL IVPB     1,500 mg 250 mL/hr over 120 Minutes Intravenous  Once 10/19/19 1636 10/19/19 1958   10/19/19 1630  ceFEPIme (MAXIPIME) 2 g in sodium chloride 0.9 % 100 mL IVPB     2 g 200 mL/hr over 30 Minutes Intravenous  Once 10/19/19 1629 10/19/19 1911      Assessment/Plan: s/p Procedure(s): sigmoid COLECTOMY WITH COLOSTOMY CREATION/ Continue ng and bowel rest  Start dressing changes tomorrow Continue zosyn day 2 VDRF per CCM Wean pressor as tolerated per CCm  LOS: 2 days    Chevis Pretty III 10/21/2019

## 2019-10-22 ENCOUNTER — Inpatient Hospital Stay (HOSPITAL_COMMUNITY): Payer: Medicare Other

## 2019-10-22 ENCOUNTER — Encounter (HOSPITAL_COMMUNITY): Payer: Self-pay | Admitting: Surgery

## 2019-10-22 ENCOUNTER — Ambulatory Visit: Payer: Medicare Other | Admitting: Internal Medicine

## 2019-10-22 DIAGNOSIS — A419 Sepsis, unspecified organism: Secondary | ICD-10-CM | POA: Diagnosis not present

## 2019-10-22 DIAGNOSIS — M7989 Other specified soft tissue disorders: Secondary | ICD-10-CM | POA: Diagnosis not present

## 2019-10-22 LAB — BPAM FFP
Blood Product Expiration Date: 202011272359
Blood Product Expiration Date: 202012012359
Blood Product Expiration Date: 202012012359
Blood Product Expiration Date: 202012032359
Blood Product Expiration Date: 202012032359
Blood Product Expiration Date: 202012032359
Blood Product Expiration Date: 202012032359
Blood Product Expiration Date: 202012032359
Blood Product Expiration Date: 202012032359
ISSUE DATE / TIME: 202011272213
ISSUE DATE / TIME: 202011272328
ISSUE DATE / TIME: 202011280706
ISSUE DATE / TIME: 202011280901
ISSUE DATE / TIME: 202011281048
ISSUE DATE / TIME: 202011281233
ISSUE DATE / TIME: 202011281639
ISSUE DATE / TIME: 202011281639
Unit Type and Rh: 6200
Unit Type and Rh: 7300
Unit Type and Rh: 7300
Unit Type and Rh: 5100
Unit Type and Rh: 5100
Unit Type and Rh: 5100
Unit Type and Rh: 5100
Unit Type and Rh: 5100
Unit Type and Rh: 5100

## 2019-10-22 LAB — PREPARE FRESH FROZEN PLASMA
Unit division: 0
Unit division: 0
Unit division: 0
Unit division: 0
Unit division: 0
Unit division: 0
Unit division: 0
Unit division: 0
Unit division: 0

## 2019-10-22 LAB — CBC
HCT: 35.3 % — ABNORMAL LOW (ref 39.0–52.0)
Hemoglobin: 11.3 g/dL — ABNORMAL LOW (ref 13.0–17.0)
MCH: 32.1 pg (ref 26.0–34.0)
MCHC: 32 g/dL (ref 30.0–36.0)
MCV: 100.3 fL — ABNORMAL HIGH (ref 80.0–100.0)
Platelets: 136 10*3/uL — ABNORMAL LOW (ref 150–400)
RBC: 3.52 MIL/uL — ABNORMAL LOW (ref 4.22–5.81)
RDW: 17.2 % — ABNORMAL HIGH (ref 11.5–15.5)
WBC: 8.8 10*3/uL (ref 4.0–10.5)
nRBC: 0 % (ref 0.0–0.2)

## 2019-10-22 LAB — COMPREHENSIVE METABOLIC PANEL
ALT: 12 U/L (ref 0–44)
AST: 40 U/L (ref 15–41)
Albumin: 2.1 g/dL — ABNORMAL LOW (ref 3.5–5.0)
Alkaline Phosphatase: 87 U/L (ref 38–126)
Anion gap: 9 (ref 5–15)
BUN: 33 mg/dL — ABNORMAL HIGH (ref 8–23)
CO2: 20 mmol/L — ABNORMAL LOW (ref 22–32)
Calcium: 7.5 mg/dL — ABNORMAL LOW (ref 8.9–10.3)
Chloride: 113 mmol/L — ABNORMAL HIGH (ref 98–111)
Creatinine, Ser: 1.05 mg/dL (ref 0.61–1.24)
GFR calc Af Amer: >60 mL/min (ref >60)
GFR calc non Af Amer: >60 mL/min (ref >60)
Glucose, Bld: 154 mg/dL — ABNORMAL HIGH (ref 70–99)
Potassium: 3.6 mmol/L (ref 3.5–5.1)
Sodium: 142 mmol/L (ref 135–145)
Total Bilirubin: 1.8 mg/dL — ABNORMAL HIGH (ref 0.3–1.2)
Total Protein: 4.5 g/dL — ABNORMAL LOW (ref 6.5–8.1)

## 2019-10-22 LAB — MAGNESIUM: Magnesium: 1.9 mg/dL (ref 1.7–2.4)

## 2019-10-22 LAB — TRIGLYCERIDES: Triglycerides: 102 mg/dL (ref <150)

## 2019-10-22 MED ORDER — AMANTADINE HCL 100 MG PO CAPS
100.0000 mg | ORAL_CAPSULE | ORAL | Status: DC
  Filled 2019-10-22 (×2): qty 1

## 2019-10-22 MED ORDER — DONEPEZIL HCL 10 MG PO TABS: 10.0000 mg | ORAL_TABLET | Freq: Every day | ORAL | Status: DC

## 2019-10-22 MED ORDER — CLONAZEPAM 0.125 MG PO TBDP
0.2500 mg | ORAL_TABLET | Freq: Two times a day (BID) | ORAL | Status: DC
  Administered 2019-10-22 – 2019-10-23 (×3): 0.25 mg
  Filled 2019-10-22 (×3): qty 2

## 2019-10-22 MED ORDER — CARBIDOPA-LEVODOPA ER 48.75-195 MG PO CPCR
2.0000 | ORAL_CAPSULE | ORAL | Status: DC
  Filled 2019-10-22: qty 2

## 2019-10-22 MED ORDER — CARBIDOPA-LEVODOPA ER 48.75-195 MG PO CPCR
3.0000 | ORAL_CAPSULE | ORAL | Status: DC
  Administered 2019-10-23 – 2019-11-01 (×18): 3 via NASOGASTRIC

## 2019-10-22 MED ORDER — CARBIDOPA-LEVODOPA ER 48.75-195 MG PO CPCR
2.0000 | ORAL_CAPSULE | ORAL | Status: DC
  Administered 2019-10-23 – 2019-10-31 (×7): 2 via NASOGASTRIC
  Filled 2019-10-22: qty 2

## 2019-10-22 MED ORDER — ENOXAPARIN SODIUM 40 MG/0.4ML ~~LOC~~ SOLN
40.0000 mg | SUBCUTANEOUS | Status: DC
  Administered 2019-10-22 – 2019-10-29 (×8): 40 mg via SUBCUTANEOUS
  Filled 2019-10-22 (×8): qty 0.4

## 2019-10-22 MED ORDER — AMIODARONE IV BOLUS ONLY 150 MG/100ML
INTRAVENOUS | Status: AC
  Filled 2019-10-22: qty 100

## 2019-10-22 MED ORDER — PHENYLEPHRINE HCL-NACL 10-0.9 MG/250ML-% IV SOLN
0.0000 ug/min | INTRAVENOUS | Status: DC
  Administered 2019-10-22 (×3): 100 ug/min via INTRAVENOUS
  Administered 2019-10-22: 80 ug/min via INTRAVENOUS
  Administered 2019-10-22: 50 ug/min via INTRAVENOUS
  Administered 2019-10-22: 70 ug/min via INTRAVENOUS
  Administered 2019-10-22: 20 ug/min via INTRAVENOUS
  Administered 2019-10-22: 60 ug/min via INTRAVENOUS
  Administered 2019-10-23: 30 ug/min via INTRAVENOUS
  Administered 2019-10-23: 40 ug/min via INTRAVENOUS
  Administered 2019-10-23: 20 ug/min via INTRAVENOUS
  Filled 2019-10-22 (×2): qty 250
  Filled 2019-10-22: qty 500
  Filled 2019-10-22 (×8): qty 250

## 2019-10-22 MED ORDER — ROTIGOTINE 4 MG/24HR TD PT24
1.0000 | MEDICATED_PATCH | Freq: Every day | TRANSDERMAL | Status: DC
  Administered 2019-10-22 – 2019-11-04 (×14): 1 via TRANSDERMAL

## 2019-10-22 MED ORDER — CARBIDOPA-LEVODOPA ER 48.75-195 MG PO CPCR
1.0000 | ORAL_CAPSULE | Freq: Every day | ORAL | Status: DC
  Administered 2019-10-23 – 2019-10-31 (×9): 1 via NASOGASTRIC

## 2019-10-22 MED ORDER — AMANTADINE HCL 50 MG/5ML PO SYRP
100.0000 mg | ORAL_SOLUTION | ORAL | Status: DC
  Administered 2019-10-22 – 2019-10-30 (×17): 100 mg
  Filled 2019-10-22 (×21): qty 10

## 2019-10-22 MED ORDER — PANTOPRAZOLE SODIUM 40 MG IV SOLR
40.0000 mg | Freq: Two times a day (BID) | INTRAVENOUS | Status: DC
  Administered 2019-10-22 – 2019-10-28 (×14): 40 mg via INTRAVENOUS
  Filled 2019-10-22 (×14): qty 40

## 2019-10-22 MED ORDER — CARBIDOPA-LEVODOPA ER 48.75-195 MG PO CPCR: 3.0000 | ORAL_CAPSULE | ORAL | Status: DC

## 2019-10-22 MED ORDER — CARBIDOPA-LEVODOPA ER 48.75-195 MG PO CPCR: 1.0000 | ORAL_CAPSULE | Freq: Every day | ORAL | Status: DC

## 2019-10-22 MED ORDER — FUROSEMIDE 10 MG/ML IJ SOLN
40.0000 mg | Freq: Once | INTRAMUSCULAR | Status: AC
  Administered 2019-10-22: 40 mg via INTRAVENOUS
  Filled 2019-10-22: qty 4

## 2019-10-22 MED ORDER — CLONAZEPAM 0.125 MG PO TBDP
0.2500 mg | ORAL_TABLET | Freq: Two times a day (BID) | ORAL | Status: DC
  Administered 2019-10-22: 0.25 mg via ORAL
  Filled 2019-10-22: qty 2

## 2019-10-22 MED ORDER — ESCITALOPRAM OXALATE 20 MG PO TABS
10.0000 mg | ORAL_TABLET | Freq: Every day | ORAL | Status: DC
  Administered 2019-10-23 – 2019-11-01 (×10): 10 mg
  Filled 2019-10-22 (×11): qty 1

## 2019-10-22 MED ORDER — DONEPEZIL HCL 10 MG PO TABS
10.0000 mg | ORAL_TABLET | Freq: Every day | ORAL | Status: DC
  Administered 2019-10-22 – 2019-10-31 (×10): 10 mg
  Filled 2019-10-22 (×10): qty 1

## 2019-10-22 MED ORDER — AMIODARONE IV BOLUS ONLY 150 MG/100ML
150.0000 mg | Freq: Once | INTRAVENOUS | Status: AC
  Administered 2019-10-22: 150 mg via INTRAVENOUS
  Filled 2019-10-22: qty 100

## 2019-10-22 MED ORDER — ESCITALOPRAM OXALATE 20 MG PO TABS
10.0000 mg | ORAL_TABLET | Freq: Every day | ORAL | Status: DC
  Administered 2019-10-22: 10 mg via ORAL
  Filled 2019-10-22: qty 1

## 2019-10-22 NOTE — Consult Note (Addendum)
NAME:  [name] HensenJohn [name], MRN:  [id number], DOB:  03/14/1932, LOS: 3 ADMISSION DATE:  10/19/2019, CONSULTATION DATE:  10/20/2019 REFERRING MD:  Estill Cotta Pahwani MD, CHIEF COMPLAINT: Acute abdomen, shock  Brief History   85 year old admitted with abdominal pain, sigmoid volvulus.  Seen by surgery and GI Underwent sigmoidoscopy on 11/27.  On 11/28 become increasingly unstable with elevated lactic acid and shock.  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 Failed decompression and went to OR for ex-lap, sigmoid colectomy and colostomy creation. Returned to ICU vented 11/29 - Weaning vent settings  Consults:  Surgery, GI, PCCM  Procedures:  11/28 - sigmoid colectomy with colostomy.   Significant Diagnostic Tests:  CT abdomen pelvis 10/19/2019-sigmoid volvulus with marked distention, bibasal lung opacities with small left effusion. CXR 11/28 - bibasilar infiltrates with small right pleural effusion.  Micro Data:  Blood cultures 11/27-negative  Antimicrobials:  Vanco, cefepime 11/27 Zosyn 11/27 >>  Interim history/subjective:  S/p ex-lap with sigmoid colectomy for infarcted and perforated bowel with colostomy creation.  Objective   Blood pressure 106/71, pulse (!) 31, temperature 97.6 F (36.4 C), temperature source Oral, resp. rate 18, height 5\' 8"  (1.727 m), weight 84.9 kg, SpO2 100 %.    Vent Mode: CPAP;PSV FiO2 (%):  [30 %-70 %] 30 % Set Rate:  [18 bmp] 18 bmp Vt Set:  [540 mL] 540 mL PEEP:  [5 cmH20-8 cmH20] 5 cmH20 Pressure Support:  [8 cmH20] 8 cmH20 Plateau Pressure:  [19 cmH20-23 cmH20] 19 cmH20   Intake/Output Summary (Last 24  hours) at 10/22/2019 1133 Last data filed at 10/22/2019 0600 Gross per 24 hour  Intake 3564.5 ml  Output 950 ml  Net 2614.5 ml   Filed Weights   10/20/19 0226 10/21/19 0500 10/22/19 0500  Weight: 69.3 kg 69.3 kg 84.9 kg   Physical Exam: General: 85 man, thin, intubated and sedated. HENT: [place], AT, ETT in place Eyes: EOMI, no scleral icterus Respiratory: Clear to auscultation bilaterally.  No crackles, wheezing or rales, tolerated SBT.  Cardiovascular: 3/6 HSM over mitral area. GI: Soft, nontender, midline wound dressing in place, LLQ colostomy - pink with stool production. Extremities: generalized edema Neuro: Sedated - no response to voice, but will react to pain including biting tube. Skin: Punctate arterial ulcerations of different stages in lower extremities bilaterally GU: Foley in place  Resolved Hospital Problem list   Sigmoid volvulus.   Assessment & Plan:   Critically ill [place]et to septic shock requiring titration of vasopressors to maintain MAP>65 Sigmoid volvulus with infarction, perforation and peritonitis s/p sigmoid colectomy and colostomy creation Critically ill due to acute hypoxemic respiratory failure secondary requiring mechanical ventilation. ARDS secondary to sepsis AF with RVR rate controlled on amiodarone. Will need to restart anticoagulation once extubated and improving. Parkinson's disease.  Improving shock with weaning vasopressors. Stopping propofol should help BP and improve mental status to allow for extubation.  Passed SBT today but needs diuresis, improved mental status and resumption of Parkinson's medications to improve extubation success. Distal colonic surgery with functioning ostomy. Should be able to start using gut for medications and start feeding soon.   Daily Goals Checklist  Pain/Anxiety/Delirium protocol (if indicated): Stop propofol - transition to analgesic based  strategy  VAP protocol (if indicated): Bundle in place.  Respiratory support goals: passed SBT  Blood pressure target: MAP >65, wean PE  DVT prophylaxis: Lovenox 70mg /d Nutritional status and feeding goals: NGT for meds, trickle feed tomorrow.  High nutritional risk. GI prophylaxis: protonix Fluid status goals: Volume overloaded Stop maintenance fluids, diuresis goal -1,-2 L Urinary catheter: Assessment of intravascular volume and active diuresis. Central lines: L [place] TLC, arterial line removed. Glucose control: euglycemic on not coverage Mobility/therapy needs: bedrest. Antibiotic de-escalation: Zosyn for 7days total. Home medication reconciliation: restart home Parkinson's medications. Daily labs: Daily BMP, CBC Code Status: full Family Communication: wife updated at bedside. Disposition: ICU   Labs   CBC: Recent Labs  Lab 10/19/19 1400 10/20/19 0215 10/20/19 1700 10/20/19 1746 10/21/19 0500 10/22/19 0500  WBC 8.2 6.1  --   --  4.2 8.8  NEUTROABS 7.5  --   --   --   --   --   HGB 14.1 12.9* 12.6* 12.2* 12.2* 11.3*  HCT 43.8 40.0 37.0* 36.0* 36.8* 35.3*  MCV 101.2* 101.3*  --   --  101.4* 100.3*  PLT 175 150  --   --  140* 136*    Basic Metabolic Panel: Recent Labs  Lab 10/19/19 1400 10/19/19 2116 10/20/19 0215 10/20/19 1700 10/20/19 1746 10/21/19 0500 10/22/19 0500  NA 137  --  138 144 145 142 142  K 3.3*  --  4.1 4.8 4.4 4.1 3.6  CL 103  --  108  --   --  113* 113*  CO2 21*  --  19*  --   --  20* 20*  GLUCOSE 184*  --  106*  --   --  116* 154*  BUN 24*  --  27*  --   --  37* 33*  CREATININE 0.79  --  0.96  --   --  1.25* 1.05  CALCIUM 8.3*  --  8.1*  --   --  7.7* 7.5*  MG  --  2.1 2.1  --   --  1.8 1.9   GFR: Estimated Creatinine Clearance: 54.6 mL/min (by C-G formula based on SCr of 1.05 mg/dL). Recent Labs  Lab 10/19/19 1400 10/19/19 2116 10/19/19 2350 10/20/19 0215 10/20/19 0450 10/20/19 0622 10/21/19 0500 10/22/19 0500  WBC 8.2  --   --  6.1  --   --  4.2 8.8  LATICACIDVEN  --  4.9* 7.6*  --   3.4* 3.3*  --   --     Liver Function Tests: Recent Labs  Lab 10/19/19 1400 10/22/19 0500  AST 36 40  ALT 8 12  ALKPHOS 156* 87  BILITOT 1.5* 1.8*  PROT 6.0* 4.5*  ALBUMIN 3.1* 2.1*   Recent Labs  Lab 10/19/19 1400  LIPASE 20   No results for input(s): AMMONIA in the last 168 hours.  ABG    Component Value Date/Time   PHART 7.426 10/21/2019 1016   PCO2ART 31.4 (L) 10/21/2019 1016   PO2ART 175 (H) 10/21/2019 1016   HCO3 20.3 10/21/2019 1016   TCO2 20 (L) 10/20/2019 1746   ACIDBASEDEF 2.8 (H) 10/21/2019 1016   O2SAT 99.8 10/21/2019 1016     Coagulation Profile: Recent Labs  Lab 10/19/19 1922 10/20/19 0625 10/20/19 1402  INR 3.3* 3.4* 1.8*    Cardiac Enzymes: No results for input(s): CKTOTAL, CKMB, CKMBINDEX, TROPONINI in the last 168 hours.  HbA1C: Hgb A1c MFr Bld  Date/Time Value Ref Range Status  03/07/2015 06:23 AM  6.0 (H) 4.8 - 5.6 % Final    Comment:    (NOTE)         Pre-diabetes: 5.7 - 6.4         Diabetes: >6.4         Glycemic control for adults with diabetes: <7.0     CBG: Recent Labs  Lab 10/19/19 2113  GLUCAP 156*   CRITICAL CARE Performed by: Lynnell Catalan  Total critical care time: 40 minutes  Critical care time was exclusive of separately billable procedures and treating other patients.  Critical care was necessary to treat or prevent imminent or life-threatening deterioration.  Critical care was time spent personally by me on the following activities: development of treatment plan with patient and/or surrogate as well as nursing, discussions with consultants, evaluation of patient's response to treatment, examination of patient, obtaining history from patient or surrogate, ordering and performing treatments and interventions, ordering and review of laboratory studies, ordering and review of radiographic studies, pulse oximetry, re-evaluation of patient's condition and participation in multidisciplinary rounds.  Lynnell Catalan, MD  [facility] ICU Physician [facility] [place] [facility]  Pager: [phone] Mobile: [phone] After hours: [phone].

## 2019-10-22 NOTE — TOC Initial Note (Signed)
Transition of Care [facility]) - Initial/Assessment Note    Patient Details  Name: [name] [name] MRN: [id number] Date of Birth: Apr 29, 1932  [facility] CM/SW Contact:    Nila Nephew, LCSW Phone Number: [phone] 10/22/2019, 9:58 AM  Clinical Narrative:    Completed high readmission risk assessment due to score 24%.  Pt admitted with abdominal pain- sigmoid volvulus, s/p colectomy 10/20/19. Colostomy placed. Pt was recently hospitalized at [facility] - had surgery for left intertrochanteric fracture. Following that stay he admitted to [facility] SNF for rehab 09/22/19- returned home with wife from there 10/10/19. Wife states he was confused during his stay at [facility] and she felt did not benefit from rehab as much as they had hoped due to that.  Has [facility] for private duty aides. Pt has Parkison's ambulates with walker at baseline.  Wife reports she is anticipating pt's mentation may be altered again after this hospitalization. TOC team will follow for disposition needs.                      Admission diagnosis:  Volvulus ([place]) [K56.2] Patient Active Problem List   Diagnosis Date Noted  . Hypotension   . Sigmoid volvulus ([place]) 10/19/2019  . Hypokalemia 10/19/2019  . PAF (paroxysmal atrial fibrillation) ([place]) 09/21/2019  . H/O mitral valve repair 09/21/2019  . TIA (transient ischemic attack) 09/21/2019  . Chronic systolic CHF (congestive heart failure) ([place]) 09/21/2019  . Dementia without behavioral disturbance ([place]) 09/21/2019  . Thrombocytopenia ([place]) 09/21/2019  . Displaced intertrochanteric fracture of left femur, initial encounter for closed fracture ([place]) 09/13/2019  . Long term (current) use of anticoagulants 08/20/2019  . HLD (hyperlipidemia) 06/02/2015  . PD (Parkinson's disease) ([place]) 06/02/2015  . Mitral regurgitation 03/17/2015  . CVA (cerebral infarction)   . Slurred speech 03/05/2015  . Anxiety 12/19/2013  . Parkinson's disease ([place])  08/06/2013  . Dyslipidemia 08/06/2013   PCP:  Crist Infante, MD Pharmacy:   [facility], [address] [place] [address] Phone: [phone] Fax: [phone]       Readmission Risk Interventions Readmission Risk Prevention Plan 10/22/2019  Transportation Screening Complete  PCP or Specialist Appt within 3-5 Days Not Complete  Not Complete comments DC date uknown- pt established with PCP  HRI or [place] Complete  Social Work Consult for [place] Planning/Counseling Complete  Palliative Care Screening Not Complete  Palliative Care Screening Not Complete Comments pending need  Medication Review (RN Transport planner) Referral to Pharmacy  Some recent data might be hidden

## 2019-10-22 NOTE — Progress Notes (Addendum)
[facility]/Trauma Progress Note  2 Days Post-Op   Assessment/Plan Active Problems:   Parkinson's disease ([place])   Displaced intertrochanteric fracture of left femur, initial encounter for closed fracture (HCC)   Chronic systolic CHF (congestive heart failure) (HCC)   Dementia without behavioral disturbance (HCC)   Sigmoid volvulus (HCC)   Hypokalemia   Hypotension  Sigmoid Volvulus with infarction, perforation, and feculent peritonitis - S/P Exploratory laparotomy, sigmoid colectomy, descending colostomy. Dr. Harlow Asa, 11/28 - Small amount of ostomy output, continue bowel rest until copious stool or gas as suspect patient will have ileus from feculent peritonitis.  Once patient is having good bowel function can begin tube feeds - qshift wet-to-dry dressing changes to midline wound - WOC consult for new ostomy - Added Protonix twice daily - AXR pending to check placement of NG tube 2/2 blood in tube  RUE swelling - DVT study ordered  FEN: NGT, NPO, protonix  VTE: SCD's, lovenox ok from our standpoint but will defer to CCM ID: Zosyn 11/27>>, afebrile, WBC 8.8 Foley: Yes Follow up: Dr. Harlow Asa  DISPO: Protonix, abdominal x-ray to check for NG tube placement, okay to start Lovenox from our standpoint but will defer to CCM. RUE [place] to check for DVT pending  Addendum: spoke with Dr. Jacalyn Lefevre regarding AXR findings.  I have ordered a CXR to determine placement of NGT. There is concerns that it may be in the lung per AXR findings. I will discuss free air findings with Dr. Lucia Gaskins. I asked nurse to stop suction of NGT.     LOS: 3 days    Subjective: CC: S/P ex lap  Pt is intubated and sedated. Nurse at bedside. She states swelling in RUE worse today than yesterday. Very little NGT output and small amt of blood in tube. Small amt of stool. Pt on pressors.   Objective: Vital signs in last 24 hours: Temp:  [97.6 F (36.4 C)-99.8 F (37.7 C)] 97.6 F (36.4 C) (11/30  0900) Pulse Rate:  [29-147] 31 (11/30 0900) Resp:  [14-27] 18 (11/30 0900) BP: (90-119)/(53-85) 106/71 (11/30 0900) SpO2:  [96 %-100 %] 100 % (11/30 0900) Arterial Line BP: (72-144)/(51-116) 96/77 (11/30 0400) FiO2 (%):  [40 %-70 %] 40 % (11/30 0832) Weight:  [84.9 kg] 84.9 kg (11/30 0500) Last BM Date: 10/22/19  Intake/Output from previous day: 11/29 0701 - 11/30 0700 In: 4719.5 [I.V.:3667.2; IV Piggyback:1052.3] Out: 950 [Urine:800; Stool:150] Intake/Output this shift: No intake/output data recorded.  PE:  Gen: Intubated and sedated Card: Mildly tachycardic Pulm: On vent Abd: Soft, ND, no bowel sounds appreciated, midline incision is clean, stoma is red and viable, small amount of liquid stool in bag. Extremities: RUE with pitting edema and blanching erythema of dorsum of hand Skin: no rashes noted, warm and dry      Anti-infectives: Anti-infectives (From admission, onward)   Start     Dose/Rate Route Frequency Ordered Stop   10/19/19 2200  piperacillin-tazobactam (ZOSYN) IVPB 3.375 g     3.375 g 12.5 mL/hr over 240 Minutes Intravenous Every 8 hours 10/19/19 1802     10/19/19 1700  vancomycin (VANCOCIN) 1,500 mg in sodium chloride 0.9 % 500 mL IVPB     1,500 mg 250 mL/hr over 120 Minutes Intravenous  Once 10/19/19 1636 10/19/19 1958   10/19/19 1630  ceFEPIme (MAXIPIME) 2 g in sodium chloride 0.9 % 100 mL IVPB     2 g 200 mL/hr over 30 Minutes Intravenous  Once 10/19/19 1629 10/19/19 1911  Lab Results:  Recent Labs    10/21/19 0500 10/22/19 0500  WBC 4.2 8.8  HGB 12.2* 11.3*  HCT 36.8* 35.3*  PLT 140* 136*   BMET Recent Labs    10/21/19 0500 10/22/19 0500  NA 142 142  K 4.1 3.6  CL 113* 113*  CO2 20* 20*  GLUCOSE 116* 154*  BUN 37* 33*  CREATININE 1.25* 1.05  CALCIUM 7.7* 7.5*   PT/INR Recent Labs    10/20/19 0625 10/20/19 1402  LABPROT 34.0* 20.6*  INR 3.4* 1.8*   CMP     Component Value Date/Time   NA 142 10/22/2019 0500   NA 143  09/10/2019 1042   K 3.6 10/22/2019 0500   CL 113 (H) 10/22/2019 0500   CO2 20 (L) 10/22/2019 0500   GLUCOSE 154 (H) 10/22/2019 0500   BUN 33 (H) 10/22/2019 0500   BUN 26 09/10/2019 1042   CREATININE 1.05 10/22/2019 0500   CREATININE 1.16 (H) 12/25/2015 1505   CALCIUM 7.5 (L) 10/22/2019 0500   PROT 4.5 (L) 10/22/2019 0500   ALBUMIN 2.1 (L) 10/22/2019 0500   AST 40 10/22/2019 0500   ALT 12 10/22/2019 0500   ALKPHOS 87 10/22/2019 0500   BILITOT 1.8 (H) 10/22/2019 0500   GFRNONAA >60 10/22/2019 0500   GFRAA >60 10/22/2019 0500   Lipase     Component Value Date/Time   LIPASE 20 10/19/2019 1400    Studies/Results: Dg Abd 1 View  Result Date: 10/20/2019 CLINICAL DATA:  OG tube placement EXAM: ABDOMEN - 1 VIEW COMPARISON:  10/20/2019 FINDINGS: OG tube tip is in the proximal stomach near the GE junction. The side port is in the distal esophagus. Dilated bowel loops in the upper abdomen, decreased since prior study. IMPRESSION: OG tube tip in the proximal stomach near the GE junction with the side port in the distal esophagus. Electronically Signed   By: Charlett Nose M.D.   On: 10/20/2019 20:56   Dg Chest Port 1 View  Result Date: 10/20/2019 CLINICAL DATA:  Central line placement EXAM: PORTABLE CHEST 1 VIEW COMPARISON:  Radiograph 10/19/2019 FINDINGS: Endotracheal tube appropriately positioned within the mid trachea, 4.4 cm from the carina. Transesophageal tube tip is positioned at the level of the GE junction. Should be advanced at least 10 cm to position the side port beyond the GE junction for optimal functioning. Left subclavian line terminates in the brachiocephalic vein. Loop recorder again projects over the heart. Post sternotomy changes with evidence of mitral valve replacement with stable cardiomegaly. Bibasilar areas of consolidative opacity more pronounced in the right lung base. Obscuration of the right hemidiaphragm likely reflecting a layering effusion. Degenerative changes are  present in the imaged spine and shoulders. No acute osseous or soft tissue abnormality. IMPRESSION: 1. Transesophageal tube tip is positioned at the level of the GE junction. Should be advanced at least 10 cm to position the GE junction for optimal functioning. 2. Satisfactory positioning of the endotracheal tube in the mid trachea. 3. Left subclavian line terminates in the brachiocephalic vein, consider advancing centrally. 4. Bibasilar areas of consolidative opacity, more pronounced in the right lung base, consistent with pneumonia. 5. Likely layering right effusion. These results will be called to the ordering clinician or representative by the Radiologist Assistant, and communication documented in the PACS or zVision Dashboard. Electronically Signed   By: Kreg Shropshire M.D.   On: 10/20/2019 21:00     Jerre Simon, PA-C [facility] Please see amion for pager for  the following: M, T, W, & Friday 7:00am - 4:30pm Thursdays 7:00am -11:30am  Agree with above. Her remains intubated. Some questions about the NGT, but it is too far out and needs advancing. His colostomy has started functioning.  His wife is at the bedside.  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  Ovidio Kinavid Shaquala Broeker, MD, [facility] Office phone:  [phone]

## 2019-10-22 NOTE — Consult Note (Signed)
[place] Nurse ostomy consult note Stoma type/location: LLQ end colostomy Stomal assessment/size: 1 1/2" round, budded, pink, moist. Observed through pouch; new ostomy created 10/20/19  Peristomal assessment: NA Treatment options for stomal/peristomal skin: NA Output brown stool Ostomy pouching: 1pc. Education provided: NA, patient sedated on the vent Enrolled patient in [place] program: No  WOC Nurse will follow along with you for continued support with ostomy teaching and care [place] MSN, [place], [place], [place], [place]

## 2019-10-22 NOTE — Progress Notes (Signed)
Right upper extremity venous duplex has been completed. Preliminary results can be found in CV Proc through chart review.   10/22/19 3:07 PM [name] [name] RVT

## 2019-10-22 NOTE — Progress Notes (Signed)
Advanced NGT to 58cm, per Dr. Lucia Gaskins based on abd/chest xray results. Patient tolerated well.

## 2019-10-22 NOTE — Progress Notes (Addendum)
[place] Progress Note Patient Name: [name] [name] DOB: 04-Mar-1932 MRN: [id number]   Date of Service  10/22/2019  HPI/Events of Note  Multiple issues: 1. AFIB with RVR - Ventricular rate = 147. Currently on an Amiodarone IV infusion and 2. Patient has Foley catheter, however, no order for Foley catheter.   eICU Interventions  Will order: 1. Bolus with Amiodarone 150 mg IV over 10 minutes now.  2. Phenylephrine IV infusion. Titrate to MAP >= 65.  3. Wean Norepinephrine IV infusion off as tolerated.  4. BMP and Mg++ level now.  5. Place Foley catheter.      Intervention Category Major Interventions: Arrhythmia - evaluation and management  Sommer,Steven Eugene 10/22/2019, 1:49 AM

## 2019-10-22 NOTE — Progress Notes (Signed)
Box of rotigotine (NEUPRO) 4 MG/24HR patch (23 ct) walked down to pharmacy to be dispensed for patient use.

## 2019-10-23 DIAGNOSIS — A419 Sepsis, unspecified organism: Secondary | ICD-10-CM | POA: Diagnosis not present

## 2019-10-23 LAB — SURGICAL PATHOLOGY

## 2019-10-23 LAB — GLUCOSE, CAPILLARY
Glucose-Capillary: 105 mg/dL — ABNORMAL HIGH (ref 70–99)
Glucose-Capillary: 110 mg/dL — ABNORMAL HIGH (ref 70–99)
Glucose-Capillary: 113 mg/dL — ABNORMAL HIGH (ref 70–99)
Glucose-Capillary: 120 mg/dL — ABNORMAL HIGH (ref 70–99)

## 2019-10-23 LAB — CBC WITH DIFFERENTIAL/PLATELET
Abs Immature Granulocytes: 0.05 10*3/uL (ref 0.00–0.07)
Basophils Absolute: 0 10*3/uL (ref 0.0–0.1)
Basophils Relative: 0 %
Eosinophils Absolute: 0 10*3/uL (ref 0.0–0.5)
Eosinophils Relative: 0 %
HCT: 34.4 % — ABNORMAL LOW (ref 39.0–52.0)
Hemoglobin: 11.1 g/dL — ABNORMAL LOW (ref 13.0–17.0)
Immature Granulocytes: 0 %
Lymphocytes Relative: 6 %
Lymphs Abs: 0.7 10*3/uL (ref 0.7–4.0)
MCH: 31.8 pg (ref 26.0–34.0)
MCHC: 32.3 g/dL (ref 30.0–36.0)
MCV: 98.6 fL (ref 80.0–100.0)
Monocytes Absolute: 0.7 10*3/uL (ref 0.1–1.0)
Monocytes Relative: 7 %
Neutro Abs: 9.6 10*3/uL — ABNORMAL HIGH (ref 1.7–7.7)
Neutrophils Relative %: 87 %
Platelets: 110 10*3/uL — ABNORMAL LOW (ref 150–400)
RBC: 3.49 MIL/uL — ABNORMAL LOW (ref 4.22–5.81)
RDW: 17.2 % — ABNORMAL HIGH (ref 11.5–15.5)
WBC: 11.2 10*3/uL — ABNORMAL HIGH (ref 4.0–10.5)
nRBC: 0 % (ref 0.0–0.2)

## 2019-10-23 LAB — BASIC METABOLIC PANEL
Anion gap: 6 (ref 5–15)
BUN: 35 mg/dL — ABNORMAL HIGH (ref 8–23)
CO2: 20 mmol/L — ABNORMAL LOW (ref 22–32)
Calcium: 7.4 mg/dL — ABNORMAL LOW (ref 8.9–10.3)
Chloride: 118 mmol/L — ABNORMAL HIGH (ref 98–111)
Creatinine, Ser: 1.06 mg/dL (ref 0.61–1.24)
GFR calc Af Amer: >60 mL/min (ref >60)
GFR calc non Af Amer: >60 mL/min (ref >60)
Glucose, Bld: 117 mg/dL — ABNORMAL HIGH (ref 70–99)
Potassium: 4 mmol/L (ref 3.5–5.1)
Sodium: 144 mmol/L (ref 135–145)

## 2019-10-23 LAB — COMPREHENSIVE METABOLIC PANEL
ALT: 12 U/L (ref 0–44)
AST: 37 U/L (ref 15–41)
Albumin: 2 g/dL — ABNORMAL LOW (ref 3.5–5.0)
Alkaline Phosphatase: 96 U/L (ref 38–126)
Anion gap: 10 (ref 5–15)
BUN: 34 mg/dL — ABNORMAL HIGH (ref 8–23)
CO2: 20 mmol/L — ABNORMAL LOW (ref 22–32)
Calcium: 7.4 mg/dL — ABNORMAL LOW (ref 8.9–10.3)
Chloride: 114 mmol/L — ABNORMAL HIGH (ref 98–111)
Creatinine, Ser: 1.01 mg/dL (ref 0.61–1.24)
GFR calc Af Amer: >60 mL/min (ref >60)
GFR calc non Af Amer: >60 mL/min (ref >60)
Glucose, Bld: 120 mg/dL — ABNORMAL HIGH (ref 70–99)
Potassium: 2.9 mmol/L — ABNORMAL LOW (ref 3.5–5.1)
Sodium: 144 mmol/L (ref 135–145)
Total Bilirubin: 2 mg/dL — ABNORMAL HIGH (ref 0.3–1.2)
Total Protein: 4.4 g/dL — ABNORMAL LOW (ref 6.5–8.1)

## 2019-10-23 LAB — TRIGLYCERIDES: Triglycerides: 147 mg/dL (ref <150)

## 2019-10-23 MED ORDER — POTASSIUM CHLORIDE 20 MEQ PO PACK
40.0000 meq | PACK | Freq: Two times a day (BID) | ORAL | Status: AC
  Administered 2019-10-23 – 2019-10-24 (×3): 40 meq via ORAL
  Filled 2019-10-23 (×3): qty 2

## 2019-10-23 MED ORDER — METOPROLOL TARTRATE 25 MG/10 ML ORAL SUSPENSION
12.5000 mg | Freq: Two times a day (BID) | ORAL | Status: DC
  Administered 2019-10-23 – 2019-10-29 (×12): 12.5 mg
  Filled 2019-10-23 (×15): qty 5

## 2019-10-23 MED ORDER — VITAL HIGH PROTEIN PO LIQD
1000.0000 mL | ORAL | Status: DC
  Administered 2019-10-23 – 2019-10-24 (×2): 1000 mL

## 2019-10-23 MED ORDER — FUROSEMIDE 10 MG/ML IJ SOLN
20.0000 mg | Freq: Two times a day (BID) | INTRAMUSCULAR | Status: DC
  Administered 2019-10-23 – 2019-10-24 (×3): 20 mg via INTRAVENOUS
  Filled 2019-10-23 (×3): qty 2

## 2019-10-23 MED ORDER — POTASSIUM CHLORIDE 20 MEQ/15ML (10%) PO SOLN
40.0000 meq | ORAL | Status: AC
  Administered 2019-10-23 (×2): 40 meq
  Filled 2019-10-23 (×2): qty 30

## 2019-10-23 NOTE — Progress Notes (Signed)
[facility] ADULT ICU REPLACEMENT PROTOCOL FOR AM LAB REPLACEMENT ONLY  The patient does apply for the [facility] Adult ICU Electrolyte Replacment Protocol based on the criteria listed below:   1. Is GFR >/= 40 ml/min? Yes.    Patient's GFR today is >60 2. Is urine output >/= 0.5 ml/kg/hr for the last 6 hours? Yes.   Patient's UOP is 0.48 ml/kg/hr 3. Is BUN < 60 mg/dL? Yes.    Patient's BUN today is 34 4. Abnormal electrolyte(s):K+ 2.9 5. Ordered repletion with: potassium via NGT 6. If a panic level lab has been reported, has the CCM MD in charge been notified? N/A Physician: per protocol  Winfred Leeds 10/23/2019 5:00 AM

## 2019-10-23 NOTE — Consult Note (Signed)
NAME:  [name] [name], MRN:  [id number], DOB:  [birth year], LOS: 4 ADMISSION DATE:  10/19/2019, CONSULTATION DATE:  10/20/2019 REFERRING MD:  Estill Cotta MD, CHIEF COMPLAINT: Acute abdomen, shock  Brief History   85 year old admitted with abdominal pain, sigmoid volvulus.  Seen by surgery and GI Underwent sigmoidoscopy on 11/27.  On 11/28 become increasingly unstable with elevated lactic acid and shock.  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 Failed decompression and went to OR for ex-lap, sigmoid colectomy and colostomy creation. Returned to ICU vented 11/29 - Weaning vent settings  Consults:  Surgery, GI, PCCM  Procedures:  11/28 - sigmoid colectomy with colostomy.   Significant Diagnostic Tests:  CT abdomen pelvis 10/19/2019-sigmoid volvulus with marked distention, bibasal lung opacities with small left effusion. CXR 11/28 - bibasilar infiltrates with small right pleural effusion.  Micro Data:  Blood cultures 11/27-negative  Antimicrobials:  Vanco, cefepime 11/27 Zosyn 11/27 >>  Interim history/subjective:  S/p ex-lap with sigmoid colectomy for infarcted and perforated bowel with colostomy creation. Tolerated SBT yesterday. Parkinson's medications restarted.  Objective   Blood pressure 92/64, pulse 86, temperature 99 F (37.2 C), temperature source Axillary, resp. rate 19, height 5\' 8"  (1.727 m), weight 86.3 kg, SpO2 100 %.    Vent Mode: CPAP;PSV FiO2 (%):  [30 %-40 %] 30 % Set Rate:  [18 bmp] 18 bmp Vt Set:  [540 mL] 540 mL PEEP:  [5 cmH20] 5 cmH20 Pressure Support:  [5 cmH20-8 cmH20] 5 cmH20 Plateau Pressure:  [16  cmH20-19 cmH20] 19 cmH20   Intake/Output Summary (Last 24 hours) at 10/23/2019 0734 Last data filed at 10/23/2019 0726 Gross per 24 hour  Intake 3532.56 ml  Output 2050 ml  Net 1482.56 ml   Filed Weights   10/21/19 0500 10/22/19 0500 10/23/19 0400  Weight: 69.3 kg 84.9 kg 86.3 kg   Physical Exam: General: elderly man, thin, intubated and sedated. HENT: [place], AT, ETT in place Eyes: EOMI, no scleral icterus Respiratory: Clear to auscultation bilaterally.  No crackles, wheezing or rales, tolerated SBT.  Cardiovascular: 3/6 HSM over mitral area. GI: Soft, nontender, midline wound dressing in place, LLQ colostomy - pink with stool production. Extremities: generalized edema Neuro: Sedated - no response to voice, but will react to pain including biting tube. Skin: Punctate arterial ulcerations of different stages in lower extremities bilaterally GU: Foley in place  Resolved Hospital Problem list   Sigmoid volvulus.   Assessment & Plan:   Critically ill duet to septic shock requiring titration of vasopressors to maintain MAP>65 Sigmoid volvulus with infarction, perforation and peritonitis s/p sigmoid colectomy and colostomy creation Critically ill due to acute hypoxemic respiratory failure secondary requiring mechanical ventilation. ARDS secondary to sepsis AF with RVR rate controlled on amiodarone. Will need to restart anticoagulation once extubated and improving. Parkinson's disease.  Improving shock with weaning vasopressors. Will stop iv amiodarone and resume low dose betablocker for rate control. Passed SBT again today but needs diuresis, improved mental status for extubation success. Distal colonic surgery with functioning ostomy. Will trickle feed.  Daily Goals Checklist  Pain/Anxiety/Delirium protocol (if indicated): keep off sedation VAP protocol (if indicated): Bundle in place. Respiratory support goals: passed SBT - extubate once awake. Blood  pressure target: MAP >65, wean  PE, amiodarone off.  DVT prophylaxis: Lovenox 70mg /d Nutritional status and feeding goals: NGT for meds, trickle feed today.  High nutritional risk. GI prophylaxis: protonix Fluid status goals: Volume overloaded Stop maintenance fluids, continue diuresis goal -1,-2 L Urinary catheter: Assessment of intravascular volume and active diuresis. Central lines: L  TLC, arterial line removed. Glucose control: euglycemic on not coverage Mobility/therapy needs: bedrest. Antibiotic de-escalation: Zosyn for 7days total. Home medication reconciliation: restart home Parkinson's medications. Daily labs: Daily BMP, CBC Code Status: full Family Communication: wife updated at bedside. Disposition: ICU   Labs   CBC: Recent Labs  Lab 10/19/19 1400 10/20/19 0215 10/20/19 1700 10/20/19 1746 10/21/19 0500 10/22/19 0500 10/23/19 0345  WBC 8.2 6.1  --   --  4.2 8.8 11.2*  NEUTROABS 7.5  --   --   --   --   --  9.6*  HGB 14.1 12.9* 12.6* 12.2* 12.2* 11.3* 11.1*  HCT 43.8 40.0 37.0* 36.0* 36.8* 35.3* 34.4*  MCV 101.2* 101.3*  --   --  101.4* 100.3* 98.6  PLT 175 150  --   --  140* 136* 110*    Basic Metabolic Panel: Recent Labs  Lab 10/19/19 1400 10/19/19 2116 10/20/19 0215 10/20/19 1700 10/20/19 1746 10/21/19 0500 10/22/19 0500 10/23/19 0345  NA 137  --  138 144 145 142 142 144  K 3.3*  --  4.1 4.8 4.4 4.1 3.6 2.9*  CL 103  --  108  --   --  113* 113* 114*  CO2 21*  --  19*  --   --  20* 20* 20*  GLUCOSE 184*  --  106*  --   --  116* 154* 120*  BUN 24*  --  27*  --   --  37* 33* 34*  CREATININE 0.79  --  0.96  --   --  1.25* 1.05 1.01  CALCIUM 8.3*  --  8.1*  --   --  7.7* 7.5* 7.4*  MG  --  2.1 2.1  --   --  1.8 1.9  --    GFR: Estimated Creatinine Clearance: 57.2 mL/min (by C-G formula based on SCr of 1.01 mg/dL). Recent Labs  Lab 10/19/19 2116 10/19/19 2350 10/20/19 0215 10/20/19 0450 10/20/19 0622 10/21/19 0500 10/22/19 0500 10/23/19 0345  WBC  --   --  6.1  --   --   4.2 8.8 11.2*  LATICACIDVEN 4.9* 7.6*  --  3.4* 3.3*  --   --   --     Liver Function Tests: Recent Labs  Lab 10/19/19 1400 10/22/19 0500 10/23/19 0345  AST 36 40 37  ALT 8 12 12   ALKPHOS 156* 87 96  BILITOT 1.5* 1.8* 2.0*  PROT 6.0* 4.5* 4.4*  ALBUMIN 3.1* 2.1* 2.0*   Recent Labs  Lab 10/19/19 1400  LIPASE 20   No results for input(s): AMMONIA in the last 168 hours.  ABG    Component Value Date/Time   PHART 7.426 10/21/2019 1016   PCO2ART 31.4 (L) 10/21/2019 1016   PO2ART 175 (H) 10/21/2019 1016   HCO3 20.3 10/21/2019 1016   TCO2 20 (L) 10/20/2019 1746   ACIDBASEDEF 2.8 (H) 10/21/2019 1016   O2SAT 99.8 10/21/2019 1016     Coagulation Profile: Recent Labs  Lab 10/19/19 1922 10/20/19 0625 10/20/19 1402  INR 3.3* 3.4* 1.8*    Cardiac Enzymes: No results for input(s): CKTOTAL, CKMB, CKMBINDEX, TROPONINI in the last 168 hours.  HbA1C: Hgb A1c MFr Bld  Date/Time Value Ref Range Status  03/07/2015 06:23 AM 6.0 (H) 4.8 - 5.6 % Final    Comment:    (NOTE)         Pre-diabetes: 5.7 - 6.4         Diabetes: >6.4         Glycemic control for adults with diabetes: <7.0     CBG: Recent Labs  Lab 10/19/19 2113  GLUCAP 156*   CRITICAL CARE Performed by: Lynnell Catalanavi Astaria Nanez  Total critical care time: 40 minutes  Critical care time was exclusive of separately billable procedures and treating other patients.  Critical care was necessary to treat or prevent imminent or life-threatening deterioration.  Critical care was time spent personally by me on the following activities: development of treatment plan with patient and/or surrogate as well as nursing, discussions with consultants, evaluation of patient's response to treatment, examination of patient, obtaining history from patient or surrogate, ordering and performing treatments and interventions, ordering and review of laboratory studies, ordering and review of radiographic studies, pulse oximetry, re-evaluation of  patient's condition and participation in multidisciplinary rounds.  Lynnell Catalanavi Jordie Skalsky, MD [facility] ICU Physician [facility]  Pager: [phone] Mobile: [phone] After hours: [phone].

## 2019-10-23 NOTE — Progress Notes (Signed)
Nutrition Follow-up  INTERVENTION:   -Continue trickle feeds of Vital HP @ 20 ml/hr. -Provides 480 kcals, 42g protein and 401 ml H2O.  -Once able to advance, recommend Vital AF 1.2 @ 20 ml/hr advance by 10 ml every 12 hours to goal rate of 40 ml/hr.  -30 ml Prostat TID -This provides 1452 kcals,  117g protein and 778 ml H2O.   NUTRITION DIAGNOSIS:   Inadequate oral intake related to inability to eat as evidenced by NPO status.  Ongoing.  GOAL:   Patient will meet greater than or equal to 90% of their needs  Progressing. Trickle feeds started.  MONITOR:   Vent status, Labs, Weight trends, I & O's, TF tolerance  REASON FOR ASSESSMENT:   Consult Enteral/tube feeding initiation and management  ASSESSMENT:   85 year old admitted with abdominal pain, sigmoid volvulus.  Seen by surgery and GIUnderwent sigmoidoscopy on 11/27.  On 11/28 become increasingly unstable with elevated lactic acid and shock.  11/26: intubated 11/27: s/p sigmoidoscopy 11/28: s/p ex lap, sigmoid colectomy, colostomy creation  Patient is currently intubated on ventilator support MV: 7.9 L/min Temp (24hrs), Avg:98.5 F (36.9 C), Min:97.8 F (36.6 C), Max:99 F (37.2 C)  Patient has began to have colostomy output, surgery cleared pt to start tube feedings. Trickle feeds to start with.  Admission weight: 152 lbs. Current weight: 190 lbs.  I/Os: +13.6L since admit UOP: 1975 ml x 24 hrs Colostomy: 75 ml  Medications: IV Lasix, KLOR-CON, Phenylephrine infusion Labs reviewed: Low K TG: 147  Diet Order:   Diet Order            Diet NPO time specified  Diet effective now              EDUCATION NEEDS:   No education needs have been identified at this time  Skin:  Skin Assessment: Reviewed RN Assessment  Last BM:  12/1  Height:   Ht Readings from Last 1 Encounters:  10/20/19 5\' 8"  (1.727 m)    Weight:   Wt Readings from Last 1 Encounters:  10/23/19 86.3 kg    Ideal Body  Weight:  70 kg  BMI:  Body mass index is 28.93 kg/m.  Estimated Nutritional Needs:   Kcal:  7867  Protein:  100-110g  Fluid:  1.6L/day   Clayton Bibles, MS, RD, LDN Inpatient Clinical Dietitian Pager: [phone] After Hours Pager: [phone]

## 2019-10-23 NOTE — Progress Notes (Addendum)
[facility]/Trauma Progress Note  3 Days Post-Op   Assessment/Plan Active Problems:   Parkinson's disease ([place])   Displaced intertrochanteric fracture of left femur, initial encounter for closed fracture (HCC)   Chronic systolic CHF (congestive heart failure) (HCC)   Dementia without behavioral disturbance (HCC)   Sigmoid volvulus (HCC)   Hypokalemia   Hypotension  Sigmoid Volvulus with infarction, perforation, and feculent peritonitis - S/P Exploratory laparotomy, sigmoid colectomy, descending colostomy. Dr. Harlow Asa, 11/28 - Small amount of ostomy output, okay to start trickle TF's   - qshift wet-to-dry dressing changes to midline wound - Protonix twice daily  RUE swelling - DVT study neg  FEN: NGT, NPO, protonix  VTE: SCD's, lovenox ok from our standpoint but will defer to CCM ID: Zosyn 11/27>>, afebrile, WBC 11.2 Foley: Yes Follow up: Dr. Harlow Asa  DISPO: okay for trickle TF's. Extubation per CCM.    LOS: 4 days    Subjective: CC: on vent  Pt is off sedation but will not wake for me. He is still on a small amt of pressors per nurse. No issues overnight per nurse.   Objective: Vital signs in last 24 hours: Temp:  [97.6 F (36.4 C)-99 F (37.2 C)] 99 F (37.2 C) (12/01 0400) Pulse Rate:  [29-103] 86 (12/01 0708) Resp:  [12-35] 19 (12/01 0708) BP: (83-122)/(55-94) 92/64 (12/01 0708) SpO2:  [91 %-100 %] 100 % (12/01 0708) FiO2 (%):  [30 %-40 %] 30 % (12/01 0708) Weight:  [86.3 kg] 86.3 kg (12/01 0400) Last BM Date: 10/23/19  Intake/Output from previous day: 11/30 0701 - 12/01 0700 In: 3506.7 [I.V.:3307.8; IV Piggyback:199] Out: 2050 [Urine:1975; Stool:75] Intake/Output this shift: Total I/O In: 25.8 [I.V.:25.8] Out: -   PE:  Gen: Intubated, off sedation but not waking up Card: regular rate Pulm: On vent Abd: Soft, ND, hypoactive BS, midline incision is clean, stoma is red and viable, small amount of liquid stool and gas in bag. No  guarding with palpation of abdomen. Skin: warm and dry  Anti-infectives: Anti-infectives (From admission, onward)   Start     Dose/Rate Route Frequency Ordered Stop   10/19/19 2200  piperacillin-tazobactam (ZOSYN) IVPB 3.375 g     3.375 g 12.5 mL/hr over 240 Minutes Intravenous Every 8 hours 10/19/19 1802 10/27/19 0159   10/19/19 1700  vancomycin (VANCOCIN) 1,500 mg in sodium chloride 0.9 % 500 mL IVPB     1,500 mg 250 mL/hr over 120 Minutes Intravenous  Once 10/19/19 1636 10/19/19 1958   10/19/19 1630  ceFEPIme (MAXIPIME) 2 g in sodium chloride 0.9 % 100 mL IVPB     2 g 200 mL/hr over 30 Minutes Intravenous  Once 10/19/19 1629 10/19/19 1911      Lab Results:  Recent Labs    10/22/19 0500 10/23/19 0345  WBC 8.8 11.2*  HGB 11.3* 11.1*  HCT 35.3* 34.4*  PLT 136* 110*   BMET Recent Labs    10/22/19 0500 10/23/19 0345  NA 142 144  K 3.6 2.9*  CL 113* 114*  CO2 20* 20*  GLUCOSE 154* 120*  BUN 33* 34*  CREATININE 1.05 1.01  CALCIUM 7.5* 7.4*   PT/INR Recent Labs    10/20/19 1402  LABPROT 20.6*  INR 1.8*   CMP     Component Value Date/Time   NA 144 10/23/2019 0345   NA 143 09/10/2019 1042   K 2.9 (L) 10/23/2019 0345   CL 114 (H) 10/23/2019 0345   CO2 20 (L) 10/23/2019 0345  GLUCOSE 120 (H) 10/23/2019 0345   BUN 34 (H) 10/23/2019 0345   BUN 26 09/10/2019 1042   CREATININE 1.01 10/23/2019 0345   CREATININE 1.16 (H) 12/25/2015 1505   CALCIUM 7.4 (L) 10/23/2019 0345   PROT 4.4 (L) 10/23/2019 0345   ALBUMIN 2.0 (L) 10/23/2019 0345   AST 37 10/23/2019 0345   ALT 12 10/23/2019 0345   ALKPHOS 96 10/23/2019 0345   BILITOT 2.0 (H) 10/23/2019 0345   GFRNONAA >60 10/23/2019 0345   GFRAA >60 10/23/2019 0345   Lipase     Component Value Date/Time   LIPASE 20 10/19/2019 1400    Studies/Results: Dg Chest Port 1 View  Result Date: 10/22/2019 CLINICAL DATA:  NG tube placement. EXAM: PORTABLE CHEST 1 VIEW COMPARISON:  08/20/2019 FINDINGS: The NG tube tip  remains in the region of the gastroesophageal junction. Persistent cardiomegaly. Persistent bilateral effusions, slightly increased on the left. Pulmonary vascularity is normal. Endotracheal tube in good position. IMPRESSION: 1. Persistent bilateral pleural effusions, slightly increased on the left. 2. No change in the position of the nasogastric tube with the tip at the GE junction. 3. Persistent cardiomegaly. Electronically Signed   By: Francene BoyersJames  Maxwell M.D.   On: 10/22/2019 12:15   Dg Abd Portable 1v  Addendum Date: 10/22/2019   ADDENDUM REPORT: 10/22/2019 10:56 ADDENDUM: These results were called by telephone at the time of interpretation on 10/22/2019 at 10:55 am to provider [facility] , who verbally acknowledged these results. Possibility of a malpositioned nasogastric tube was discussed given the lateral position. A chest radiograph was suggested to assess position further and ensure that is not placed within the tracheobronchial tree. Also moderate amount of free air, volume visible on supine radiograph while nonspecific postoperatively may be more than expected, clinical correlation and further imaging as indicated was suggested. Electronically Signed   By: Donzetta KohutGeoffrey  Wile M.D.   On: 10/22/2019 10:56   Result Date: 10/22/2019 CLINICAL DATA:  NG tube placement. EXAM: PORTABLE ABDOMEN - 1 VIEW COMPARISON:  Chest x-ray 10/20/2019 FINDINGS: Signs of free air along the right upper quadrant with Rigler's sign, outlining the colon in this location. An enteric tube is partially visualized passing into the left upper quadrant, slightly more lateral than would be expected based on previous imaging assessments of this area. Signs of valvular replacement in the chest partially visualized. IMPRESSION: 1. Moderate free air suspected in the right upper quadrant. Patient is recent postop, suggest correlation with any changes in symptoms to determine whether further imaging with CT may be necessary as the amount of  free air suggested may be more than expected. 2. Enteric tube is partially visualized into the left upper quadrant, slightly more lateral than would be expected on previous imaging., this may be in the proximal stomach. Suggest correlation with chest radiography prior to tube manipulation to ensure that it passes below the carina and is in fact in the esophagus, not the airway. 3. A call is out to the referring provider to further discuss findings in the above case. Electronically Signed: By: Donzetta KohutGeoffrey  Wile M.D. On: 10/22/2019 10:43   Vas [place] Ue Dvt  Result Date: 10/22/2019 UPPER VENOUS STUDY  Indications: Swelling Risk Factors: None identified. Limitations: Poor ultrasound/tissue interface and patient immobility. Comparison Study: No prior studies. Performing Technologist: Chanda BusingGregory Collins RVT  Examination Guidelines: A complete evaluation includes B-mode imaging, spectral Doppler, color Doppler, and power Doppler as needed of all accessible portions of each vessel. Bilateral testing is considered an integral part of  a complete examination. Limited examinations for reoccurring indications may be performed as noted.  Right Findings: +----------+------------+---------+-----------+----------+-------+ RIGHT     CompressiblePhasicitySpontaneousPropertiesSummary +----------+------------+---------+-----------+----------+-------+ IJV           Full       Yes       Yes                      +----------+------------+---------+-----------+----------+-------+ Subclavian    Full       Yes       Yes                      +----------+------------+---------+-----------+----------+-------+ Axillary      Full       Yes       Yes                      +----------+------------+---------+-----------+----------+-------+ Brachial      Full       Yes       Yes                      +----------+------------+---------+-----------+----------+-------+ Radial        Full                                           +----------+------------+---------+-----------+----------+-------+ Ulnar         Full                                          +----------+------------+---------+-----------+----------+-------+ Cephalic      Full                                          +----------+------------+---------+-----------+----------+-------+ Basilic       Full                                          +----------+------------+---------+-----------+----------+-------+  Left Findings: +----------+------------+---------+-----------+----------+-------+ LEFT      CompressiblePhasicitySpontaneousPropertiesSummary +----------+------------+---------+-----------+----------+-------+ Subclavian    Full       Yes       Yes                      +----------+------------+---------+-----------+----------+-------+  Summary:  Right: No evidence of deep vein thrombosis in the upper extremity. No evidence of superficial vein thrombosis in the upper extremity.  Left: No evidence of thrombosis in the subclavian.  *See table(s) above for measurements and observations.  Diagnosing physician: Fabienne Bruns MD Electronically signed by Fabienne Bruns MD on 10/22/2019 at 3:45:29 PM.    Final      Jerre Simon, [facility] Please see amion for pager for the following: M, T, W, & Friday 7:00am - 4:30pm Thursdays 7:00am -11:30am  Agree with above. Ostomy working.  Hoping to extubate today. To start trickle tube feedings.  Ovidio Kin, MD, [facility] Surgery Office phone:  [phone]

## 2019-10-23 NOTE — Progress Notes (Signed)
Pharmacy Antibiotic Note  [name] [name] is a 85 y.o. male admitted on 10/19/2019 with aspiration PNA.  Pharmacy has been consulted for zosyn dosing.  Plan: Zosyn 3.375g IV q8h (4 hour infusion). x 7 days  Height: 5\' 8"  (172.7 cm) Weight: 190 lb 4.1 oz (86.3 kg) IBW/kg (Calculated) : 68.4  Temp (24hrs), Avg:98.4 F (36.9 C), Min:97.8 F (36.6 C), Max:99 F (37.2 C)  Recent Labs  Lab 10/19/19 1400 10/19/19 2116 10/19/19 2350 10/20/19 0215 10/20/19 0450 10/20/19 0622 10/21/19 0500 10/22/19 0500 10/23/19 0345 10/23/19 1324  WBC 8.2  --   --  6.1  --   --  4.2 8.8 11.2*  --   CREATININE 0.79  --   --  0.96  --   --  1.25* 1.05 1.01 1.06  LATICACIDVEN  --  4.9* 7.6*  --  3.4* 3.3*  --   --   --   --     Estimated Creatinine Clearance: 54.5 mL/min (by C-G formula based on SCr of 1.06 mg/dL).    Allergies  Allergen Reactions  . Apixaban Palpitations, Rash and Other (See Comments)    Worsening tardive dyskinesia and rapid heart rate  . Ace Inhibitors Cough  . Demerol [Meperidine] Other (See Comments)    Parkinsons disease  . Poison Ivy Extract Itching, Swelling and Rash    Antimicrobials this admission: 11/27 Vanc x 1 11/27 cefepime x 1 11/27 zosyn >>  Dose adjustments this admission:  Microbiology results: 11/27 UCx: NGF 11/27 BCx2: NGTD 11/27 MRSA PCR: negative 11/27 COVID: negative 11/27 strep pneumo: negative 11/27 legionella: negative  Thank you for allowing pharmacy to be a part of this patient's care.  [name] PharmD 10/23/2019, 3:04 PM

## 2019-10-24 DIAGNOSIS — J9601 Acute respiratory failure with hypoxia: Secondary | ICD-10-CM

## 2019-10-24 LAB — TRIGLYCERIDES: Triglycerides: 82 mg/dL (ref <150)

## 2019-10-24 LAB — CBC WITH DIFFERENTIAL/PLATELET
Abs Immature Granulocytes: 0.04 10*3/uL (ref 0.00–0.07)
Basophils Absolute: 0 10*3/uL (ref 0.0–0.1)
Basophils Relative: 0 %
Eosinophils Absolute: 0 10*3/uL (ref 0.0–0.5)
Eosinophils Relative: 0 %
HCT: 34.1 % — ABNORMAL LOW (ref 39.0–52.0)
Hemoglobin: 11 g/dL — ABNORMAL LOW (ref 13.0–17.0)
Immature Granulocytes: 1 %
Lymphocytes Relative: 11 %
Lymphs Abs: 0.8 10*3/uL (ref 0.7–4.0)
MCH: 32.1 pg (ref 26.0–34.0)
MCHC: 32.3 g/dL (ref 30.0–36.0)
MCV: 99.4 fL (ref 80.0–100.0)
Monocytes Absolute: 0.7 10*3/uL (ref 0.1–1.0)
Monocytes Relative: 10 %
Neutro Abs: 5.4 10*3/uL (ref 1.7–7.7)
Neutrophils Relative %: 78 %
Platelets: 91 10*3/uL — ABNORMAL LOW (ref 150–400)
RBC: 3.43 MIL/uL — ABNORMAL LOW (ref 4.22–5.81)
RDW: 17.5 % — ABNORMAL HIGH (ref 11.5–15.5)
WBC: 7 10*3/uL (ref 4.0–10.5)
nRBC: 0 % (ref 0.0–0.2)

## 2019-10-24 LAB — COMPREHENSIVE METABOLIC PANEL
ALT: 8 U/L (ref 0–44)
AST: 41 U/L (ref 15–41)
Albumin: 1.9 g/dL — ABNORMAL LOW (ref 3.5–5.0)
Alkaline Phosphatase: 124 U/L (ref 38–126)
Anion gap: 8 (ref 5–15)
BUN: 39 mg/dL — ABNORMAL HIGH (ref 8–23)
CO2: 20 mmol/L — ABNORMAL LOW (ref 22–32)
Calcium: 7.7 mg/dL — ABNORMAL LOW (ref 8.9–10.3)
Chloride: 118 mmol/L — ABNORMAL HIGH (ref 98–111)
Creatinine, Ser: 1.03 mg/dL (ref 0.61–1.24)
GFR calc Af Amer: >60 mL/min (ref >60)
GFR calc non Af Amer: >60 mL/min (ref >60)
Glucose, Bld: 120 mg/dL — ABNORMAL HIGH (ref 70–99)
Potassium: 3.7 mmol/L (ref 3.5–5.1)
Sodium: 146 mmol/L — ABNORMAL HIGH (ref 135–145)
Total Bilirubin: 2.1 mg/dL — ABNORMAL HIGH (ref 0.3–1.2)
Total Protein: 4.5 g/dL — ABNORMAL LOW (ref 6.5–8.1)

## 2019-10-24 LAB — GLUCOSE, CAPILLARY
Glucose-Capillary: 102 mg/dL — ABNORMAL HIGH (ref 70–99)
Glucose-Capillary: 110 mg/dL — ABNORMAL HIGH (ref 70–99)
Glucose-Capillary: 118 mg/dL — ABNORMAL HIGH (ref 70–99)
Glucose-Capillary: 114 mg/dL — ABNORMAL HIGH (ref 70–99)
Glucose-Capillary: 113 mg/dL — ABNORMAL HIGH (ref 70–99)
Glucose-Capillary: 112 mg/dL — ABNORMAL HIGH (ref 70–99)

## 2019-10-24 LAB — CULTURE, BLOOD (ROUTINE X 2)
Culture: NO GROWTH
Culture: NO GROWTH
Special Requests: ADEQUATE
Special Requests: ADEQUATE

## 2019-10-24 MED ORDER — FUROSEMIDE 10 MG/ML IJ SOLN
40.0000 mg | Freq: Two times a day (BID) | INTRAMUSCULAR | Status: DC
  Administered 2019-10-24 – 2019-10-27 (×6): 40 mg via INTRAVENOUS
  Filled 2019-10-24 (×6): qty 4

## 2019-10-24 MED ORDER — DEXTROSE 5 % IV SOLN
INTRAVENOUS | Status: DC
  Administered 2019-10-24 – 2019-10-29 (×2): via INTRAVENOUS

## 2019-10-24 MED ORDER — CLONAZEPAM 0.125 MG PO TBDP
0.1250 mg | ORAL_TABLET | Freq: Every day | ORAL | Status: DC
  Administered 2019-10-25: 0.125 mg
  Filled 2019-10-24: qty 1

## 2019-10-24 MED ORDER — FREE WATER
200.0000 mL | Freq: Four times a day (QID) | Status: DC
  Administered 2019-10-24 – 2019-10-30 (×24): 200 mL

## 2019-10-24 NOTE — Progress Notes (Signed)
PHARMACY NOTE -  [place] has been assisting with dosing of Zosyn for IAI.  Dosage remains stable at 3.375 g IV q8 hr and need for further dosage adjustment appears unlikely at present given SCr at baseline  Course to complete 12/4  Pharmacy will sign off, following peripherally for culture results or dose adjustments. Please reconsult if a change in clinical status warrants re-evaluation of dosage.  Reuel Boom, PharmD, BCPS [phone] 10/24/2019, 3:01 PM

## 2019-10-24 NOTE — Progress Notes (Signed)
RT notified by RN that pts. bite block had been dislodged-pt. remains biting down on E.T. tube, replaced with new one to R side to avoid breakdown, tolerated well.

## 2019-10-24 NOTE — Progress Notes (Addendum)
NAME:  [name] [name], MRN:  [id number], DOB:  04/29/1932, LOS: 5 ADMISSION DATE:  10/19/2019, CONSULTATION DATE:  10/20/2019 REFERRING MD:  Loann Quill MD, CHIEF COMPLAINT: Acute abdomen, shock  Brief History   85 year old admitted with abdominal pain, sigmoid volvulus.  Seen by surgery and GI Underwent sigmoidoscopy on 11/27.  On 11/28 become increasingly unstable with elevated lactic acid and shock.  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 Failed decompression and went to OR for ex-lap, sigmoid colectomy and colostomy creation. Returned to ICU vented 11/29 - Weaning vent settings 12/2 off pressors for > 24 hours. Weaning but not strong enough or awake enough to come off vent. Advancing tube feeds. IV diuresis for third spacing. Still over 13 liters positive. Consults:  Surgery, GI, PCCM  Procedures:  11/28 - sigmoid colectomy with colostomy.   Significant Diagnostic Tests:  CT abdomen pelvis 10/19/2019-sigmoid volvulus with marked distention, bibasal lung opacities with small left effusion. CXR 11/28 - bibasilar infiltrates with small right pleural effusion.  Micro Data:  Blood cultures 11/27-negative  Antimicrobials:  Vanco, cefepime 11/27 Zosyn 11/27 >>  Interim history/subjective:  S/p ex-lap with sigmoid colectomy for infarcted and perforated bowel with colostomy creation. Tolerated SBT yesterday. Parkinson's medications restarted.  Objective   Blood pressure (Abnormal) 115/98, pulse 92, temperature 98.8 F (37.1 C), temperature source Oral, resp. rate 19, height 5\' 8"  (1.727 m), weight 85 kg, SpO2 100 %.    Vent Mode:  PSV;CPAP FiO2 (%):  [30 %] 30 % Set Rate:  [18 bmp] 18 bmp Vt Set:  [540 mL] 540 mL PEEP:  [5 cmH20] 5 cmH20 Pressure Support:  [5 cmH20-10 cmH20] 10 cmH20 Plateau Pressure:  [17 cmH20-20 cmH20] 17 cmH20   Intake/Output Summary (Last 24 hours) at 10/24/2019 4098 Last data filed at 10/24/2019 1191 Gross per 24 hour  Intake 1060.6 ml  Output 1750 ml  Net -689.4 ml   Filed Weights   10/22/19 0500 10/23/19 0400 10/24/19 0419  Weight: 84.9 kg 86.3 kg 85 kg   Physical Exam: General: This is an elderly 85 year old male patient who remains on ventilatory support he appears deconditioned is still sedated HENT NCAT no JVD orally intubated  Currently on pressure support of 10/PEEP of 5 saturations 100% no accessory use appreciated equal breath sounds bilaterally clear but diminished in the base Cardiac: 3 out of 6 to 4 out of 6 holosystolic murmur best heard over mitral region currently atrial fibrillation on telemetry Abdomen: Soft, mid abdominal dressing intact left lower quadrant colostomy with beefy appearing stoma is draining stool Extremities: Diffuse anasarca, punctate arterial ulcerations in both lower extremities bilaterally GU clear yellow Neuro: Will not open eyes, does follow commands, he is profoundly weak   Resolved Hospital Problem list   Sigmoid volvulus.  Septic shock Assessment & Plan:    Sigmoid volvulus with infarction, perforation and peritonitis s/p sigmoid colectomy and colostomy creation on 11/28 Plan Continue routine wound care Slow advancing tube feeds as directed by general surgery Complete 7 days of abx  acute hypoxemic respiratory failure mechanical ventilation status post above Portable chest x-ray personally reviewed: From 11/30 demonstrated endotracheal tube in satisfactory position and what appears to be some degree of right-sided  pleural effusion He is tolerating pressure support of 10 well without accessory use however his mental status is his  limiting factor at this point as is his level of deconditioning Plan Continue pressure support ventilation I am going to decrease his clonazepam dosing Repeat chest x-ray today to follow-up on effusions Continue nutritional support Daily spontaneous breathing trials VAP bundle PAD protocol with RASS goal of 0 Continuing diuresis as BUN and creatinine as well as blood pressure allow , He is currently over 13 L positive  AF  -No longer in rapid ventricular response, had required amiodarone he is now off Plan Continue telemetry monitoring Continuing metoprolol at 12.5 mg via tube twice daily We will eventually need to resume systemic anticoagulation but will defer that for now and continue venous ulcer prophylaxis dosing  Parkinson's disease. Plan Continue home regimen of Symmetrel, carbidopa/levodopa also Aricept.  Fluid and electrolyte balance hypernatremia, hyperchloremia, mild non-anion gap metabolic acidosis Plan Discontinuing any sodium chloride Free water replacement A.m. chemistry  Mild thrombocytopenia Plan Trend cbc  Severe protein calorie malnutrition Plan Continue tube feeds  Severe physical deconditioning Plan Physical therapy when mental status will allow continue passive range of motion at this point  Daily Goals Checklist  Pain/Anxiety/Delirium protocol (if indicated): keep off sedation VAP protocol (if indicated): Bundle in place..  DVT prophylaxis: Lovenox 70mg /d Nutritional status and feeding goals: NGT for meds, trickle feed High nutritional risk. GI prophylaxis: protonix Central lines: L [place] TLC, arterial line removed. Glucose control: euglycemic on not coverage Code Status: full Family Communication: wife updated at bedside. Disposition: ICU     My critical care time is 32 minutes  ACNP-BC [facility] Pulmonary/Critical Care Pager # [phone] OR # [phone] if no answer

## 2019-10-24 NOTE — Progress Notes (Addendum)
[facility]/Trauma Progress Note  4 Days Post-Op   Assessment/Plan Active Problems: Parkinson's disease ([place]) Displaced intertrochanteric fracture of left femur, initial encounter for closed fracture (HCC) Chronic systolic CHF (congestive heart failure) (HCC) Dementia without behavioral disturbance (HCC) Sigmoid volvulus (HCC) Hypokalemia Hypotension  Sigmoid Volvuluswith infarction, perforation, and feculent peritonitis - S/PExploratory laparotomy, sigmoid colectomy, descending colostomy. Dr. Harlow Asa, 11/28 -good ostomy output,okay to slowly increase TF's - qshift wet-to-dry dressing changes to midline wound - Protonix twice daily  RUE swelling -DVT study neg  FEN:NGT, TF's @ 76mL /hr, protonix  VTE: SCD's, lovenox ZO:XWRUE 11/27-12/05,afebrile, WBC 7.0 Foley:no Follow up:Dr.Gerkin  DISPO:okay to increase TF's to 65mL/hr. Extubation per CCM.    LOS: 5 days    Subjective: CC: on vent  Off sedation. Attempts to open eyes to voice.   Objective: Vital signs in last 24 hours: Temp:  [98.1 F (36.7 C)-99.4 F (37.4 C)] 98.3 F (36.8 C) (12/02 0400) Pulse Rate:  [41-94] 87 (12/02 0400) Resp:  [15-21] 18 (12/02 0600) BP: (86-123)/(59-93) 99/78 (12/02 0600) SpO2:  [95 %-100 %] 100 % (12/02 0400) FiO2 (%):  [30 %] 30 % (12/02 0758) Weight:  [85 kg] 85 kg (12/02 0419) Last BM Date: 10/23/19  Intake/Output from previous day: 12/01 0701 - 12/02 0700 In: 1126.4 [I.V.:493.2; NG/GT:485.3; IV Piggyback:147.9] Out: 4540 [Urine:1100; Stool:475] Intake/Output this shift: No intake/output data recorded.  PE:  JWJ:XBJYNWGNF, off sedation and attempts to open eyes Card:regular rate Pulm:On vent Abd: Soft, ND,hypoactive BS, midline incision is clean, stoma is red and viable, stool and gas in bag. No guarding with palpation of abdomen. Skin: warm and dry    Anti-infectives: Anti-infectives (From admission, onward)    Start     Dose/Rate Route Frequency Ordered Stop   10/19/19 2200  piperacillin-tazobactam (ZOSYN) IVPB 3.375 g     3.375 g 12.5 mL/hr over 240 Minutes Intravenous Every 8 hours 10/19/19 1802 10/27/19 0159   10/19/19 1700  vancomycin (VANCOCIN) 1,500 mg in sodium chloride 0.9 % 500 mL IVPB     1,500 mg 250 mL/hr over 120 Minutes Intravenous  Once 10/19/19 1636 10/19/19 1958   10/19/19 1630  ceFEPIme (MAXIPIME) 2 g in sodium chloride 0.9 % 100 mL IVPB     2 g 200 mL/hr over 30 Minutes Intravenous  Once 10/19/19 1629 10/19/19 1911      Lab Results:  Recent Labs    10/23/19 0345 10/24/19 0410  WBC 11.2* 7.0  HGB 11.1* 11.0*  HCT 34.4* 34.1*  PLT 110* 91*   BMET Recent Labs    10/23/19 1324 10/24/19 0410  NA 144 146*  K 4.0 3.7  CL 118* 118*  CO2 20* 20*  GLUCOSE 117* 120*  BUN 35* 39*  CREATININE 1.06 1.03  CALCIUM 7.4* 7.7*   PT/INR No results for input(s): LABPROT, INR in the last 72 hours. CMP     Component Value Date/Time   NA 146 (H) 10/24/2019 0410   NA 143 09/10/2019 1042   K 3.7 10/24/2019 0410   CL 118 (H) 10/24/2019 0410   CO2 20 (L) 10/24/2019 0410   GLUCOSE 120 (H) 10/24/2019 0410   BUN 39 (H) 10/24/2019 0410   BUN 26 09/10/2019 1042   CREATININE 1.03 10/24/2019 0410   CREATININE 1.16 (H) 12/25/2015 1505   CALCIUM 7.7 (L) 10/24/2019 0410   PROT 4.5 (L) 10/24/2019 0410   ALBUMIN 1.9 (L) 10/24/2019 0410   AST 41 10/24/2019 0410   ALT 8 10/24/2019 0410  ALKPHOS 124 10/24/2019 0410   BILITOT 2.1 (H) 10/24/2019 0410   GFRNONAA >60 10/24/2019 0410   GFRAA >60 10/24/2019 0410   Lipase     Component Value Date/Time   LIPASE 20 10/19/2019 1400    Studies/Results: Dg Chest Port 1 View  Result Date: 10/22/2019 CLINICAL DATA:  NG tube placement. EXAM: PORTABLE CHEST 1 VIEW COMPARISON:  08/20/2019 FINDINGS: The NG tube tip remains in the region of the gastroesophageal junction. Persistent cardiomegaly. Persistent bilateral effusions, slightly  increased on the left. Pulmonary vascularity is normal. Endotracheal tube in good position. IMPRESSION: 1. Persistent bilateral pleural effusions, slightly increased on the left. 2. No change in the position of the nasogastric tube with the tip at the GE junction. 3. Persistent cardiomegaly. Electronically Signed   By: Francene BoyersJames  Maxwell M.D.   On: 10/22/2019 12:15   Dg Abd Portable 1v  Addendum Date: 10/22/2019   ADDENDUM REPORT: 10/22/2019 10:56 ADDENDUM: These results were called by telephone at the time of interpretation on 10/22/2019 at 10:55 am to provider [facility] , who verbally acknowledged these results. Possibility of a malpositioned nasogastric tube was discussed given the lateral position. A chest radiograph was suggested to assess position further and ensure that is not placed within the tracheobronchial tree. Also moderate amount of free air, volume visible on supine radiograph while nonspecific postoperatively may be more than expected, clinical correlation and further imaging as indicated was suggested. Electronically Signed   By: Donzetta KohutGeoffrey  Wile M.D.   On: 10/22/2019 10:56   Result Date: 10/22/2019 CLINICAL DATA:  NG tube placement. EXAM: PORTABLE ABDOMEN - 1 VIEW COMPARISON:  Chest x-ray 10/20/2019 FINDINGS: Signs of free air along the right upper quadrant with Rigler's sign, outlining the colon in this location. An enteric tube is partially visualized passing into the left upper quadrant, slightly more lateral than would be expected based on previous imaging assessments of this area. Signs of valvular replacement in the chest partially visualized. IMPRESSION: 1. Moderate free air suspected in the right upper quadrant. Patient is recent postop, suggest correlation with any changes in symptoms to determine whether further imaging with CT may be necessary as the amount of free air suggested may be more than expected. 2. Enteric tube is partially visualized into the left upper quadrant,  slightly more lateral than would be expected on previous imaging., this may be in the proximal stomach. Suggest correlation with chest radiography prior to tube manipulation to ensure that it passes below the carina and is in fact in the esophagus, not the airway. 3. A call is out to the referring provider to further discuss findings in the above case. Electronically Signed: By: Donzetta KohutGeoffrey  Wile M.D. On: 10/22/2019 10:43   Vas [place]as Ue Dvt  Result Date: 10/22/2019 UPPER VENOUS STUDY  Indications: Swelling Risk Factors: None identified. Limitations: Poor ultrasound/tissue interface and patient immobility. Comparison Study: No prior studies. Performing Technologist: Chanda BusingGregory Collins RVT  Examination Guidelines: A complete evaluation includes B-mode imaging, spectral Doppler, color Doppler, and power Doppler as needed of all accessible portions of each vessel. Bilateral testing is considered an integral part of a complete examination. Limited examinations for reoccurring indications may be performed as noted.  Right Findings: +----------+------------+---------+-----------+----------+-------+ RIGHT     CompressiblePhasicitySpontaneousPropertiesSummary +----------+------------+---------+-----------+----------+-------+ IJV           Full       Yes       Yes                      +----------+------------+---------+-----------+----------+-------+  Subclavian    Full       Yes       Yes                      +----------+------------+---------+-----------+----------+-------+ Axillary      Full       Yes       Yes                      +----------+------------+---------+-----------+----------+-------+ Brachial      Full       Yes       Yes                      +----------+------------+---------+-----------+----------+-------+ Radial        Full                                          +----------+------------+---------+-----------+----------+-------+ Ulnar         Full                                           +----------+------------+---------+-----------+----------+-------+ Cephalic      Full                                          +----------+------------+---------+-----------+----------+-------+ Basilic       Full                                          +----------+------------+---------+-----------+----------+-------+  Left Findings: +----------+------------+---------+-----------+----------+-------+ LEFT      CompressiblePhasicitySpontaneousPropertiesSummary +----------+------------+---------+-----------+----------+-------+ Subclavian    Full       Yes       Yes                      +----------+------------+---------+-----------+----------+-------+  Summary:  Right: No evidence of deep vein thrombosis in the upper extremity. No evidence of superficial vein thrombosis in the upper extremity.  Left: No evidence of thrombosis in the subclavian.  *See table(s) above for measurements and observations.  Diagnosing physician: Fabienne Bruns MD Electronically signed by Fabienne Bruns MD on 10/22/2019 at 3:45:29 PM.    Final      Jerre Simon, [facility] Surgery Please see amion for pager for the following: M, T, W, & Friday 7:00am - 4:30pm Thursdays 7:00am -11:30am  Agree with above. Remains intubated, but arouses. Ostomy working and will advance TF.  His wife is at the bedside and we discussed the next steps in his care.  Ovidio Kin, MD, [facility] Surgery Office phone:  [phone]

## 2019-10-24 NOTE — Consult Note (Signed)
[place] Nurse ostomy follow up Stoma type/location: LLQ, end colostomy Stomal assessment/size: 1 3/4" round, budded, pink, moist Peristomal assessment: intact  Treatment options for stomal/peristomal skin: 2" barrier ring Output liquid brown Ostomy pouching: 1pc.flat with 2" barrier ring Education provided: NA, patient sedated on the vent Enrolled patient in [employer] Discharge program: No  2 extra 1pc flat and 2 barrier rings in the patient's room. No educational materials yet, patient on vent. No family at bedside.   [place] Nurse will follow along with you for continued support with ostomy teaching and care Fabrica MSN, RN, [place], [place], [place]

## 2019-10-25 ENCOUNTER — Inpatient Hospital Stay (HOSPITAL_COMMUNITY): Payer: Medicare Other

## 2019-10-25 DIAGNOSIS — J9601 Acute respiratory failure with hypoxia: Secondary | ICD-10-CM | POA: Diagnosis not present

## 2019-10-25 LAB — COMPREHENSIVE METABOLIC PANEL
ALT: 8 U/L (ref 0–44)
AST: 44 U/L — ABNORMAL HIGH (ref 15–41)
Albumin: 2.1 g/dL — ABNORMAL LOW (ref 3.5–5.0)
Alkaline Phosphatase: 136 U/L — ABNORMAL HIGH (ref 38–126)
Anion gap: 10 (ref 5–15)
BUN: 40 mg/dL — ABNORMAL HIGH (ref 8–23)
CO2: 22 mmol/L (ref 22–32)
Calcium: 7.8 mg/dL — ABNORMAL LOW (ref 8.9–10.3)
Chloride: 112 mmol/L — ABNORMAL HIGH (ref 98–111)
Creatinine, Ser: 1.11 mg/dL (ref 0.61–1.24)
GFR calc Af Amer: >60 mL/min (ref >60)
GFR calc non Af Amer: >60 mL/min (ref >60)
Glucose, Bld: 104 mg/dL — ABNORMAL HIGH (ref 70–99)
Potassium: 3.1 mmol/L — ABNORMAL LOW (ref 3.5–5.1)
Sodium: 144 mmol/L (ref 135–145)
Total Bilirubin: 2.7 mg/dL — ABNORMAL HIGH (ref 0.3–1.2)
Total Protein: 4.4 g/dL — ABNORMAL LOW (ref 6.5–8.1)

## 2019-10-25 LAB — GLUCOSE, CAPILLARY
Glucose-Capillary: 90 mg/dL (ref 70–99)
Glucose-Capillary: 80 mg/dL (ref 70–99)
Glucose-Capillary: 102 mg/dL — ABNORMAL HIGH (ref 70–99)
Glucose-Capillary: 97 mg/dL (ref 70–99)
Glucose-Capillary: 97 mg/dL (ref 70–99)
Glucose-Capillary: 109 mg/dL — ABNORMAL HIGH (ref 70–99)

## 2019-10-25 LAB — CBC WITH DIFFERENTIAL/PLATELET
Abs Immature Granulocytes: 0.07 10*3/uL (ref 0.00–0.07)
Basophils Absolute: 0 10*3/uL (ref 0.0–0.1)
Basophils Relative: 0 %
Eosinophils Absolute: 0 10*3/uL (ref 0.0–0.5)
Eosinophils Relative: 1 %
HCT: 32.4 % — ABNORMAL LOW (ref 39.0–52.0)
Hemoglobin: 10.3 g/dL — ABNORMAL LOW (ref 13.0–17.0)
Immature Granulocytes: 1 %
Lymphocytes Relative: 12 %
Lymphs Abs: 0.7 10*3/uL (ref 0.7–4.0)
MCH: 31.4 pg (ref 26.0–34.0)
MCHC: 31.8 g/dL (ref 30.0–36.0)
MCV: 98.8 fL (ref 80.0–100.0)
Monocytes Absolute: 0.6 10*3/uL (ref 0.1–1.0)
Monocytes Relative: 10 %
Neutro Abs: 4.8 10*3/uL (ref 1.7–7.7)
Neutrophils Relative %: 76 %
Platelets: 91 10*3/uL — ABNORMAL LOW (ref 150–400)
RBC: 3.28 MIL/uL — ABNORMAL LOW (ref 4.22–5.81)
RDW: 16.8 % — ABNORMAL HIGH (ref 11.5–15.5)
WBC: 6.2 10*3/uL (ref 4.0–10.5)
nRBC: 0 % (ref 0.0–0.2)

## 2019-10-25 LAB — TRIGLYCERIDES: Triglycerides: 87 mg/dL (ref <150)

## 2019-10-25 MED ORDER — VITAL HIGH PROTEIN PO LIQD: 1000.0000 mL | ORAL | Status: DC

## 2019-10-25 MED ORDER — POTASSIUM CHLORIDE 20 MEQ/15ML (10%) PO SOLN
40.0000 meq | Freq: Two times a day (BID) | ORAL | Status: AC
  Administered 2019-10-25 (×2): 40 meq
  Filled 2019-10-25 (×2): qty 30

## 2019-10-25 MED ORDER — VITAL AF 1.2 CAL PO LIQD
1000.0000 mL | ORAL | Status: DC
  Administered 2019-10-25 (×2): 1000 mL

## 2019-10-25 MED ORDER — VITAL HIGH PROTEIN PO LIQD
1000.0000 mL | ORAL | Status: AC
  Administered 2019-10-25: 13:00:00 1000 mL

## 2019-10-25 NOTE — Progress Notes (Addendum)
[facility]/Trauma Progress Note  5 Days Post-Op   Assessment/Plan Active Problems: Parkinson's disease ([place]) Displaced intertrochanteric fracture of left femur, initial encounter for closed fracture (HCC) Chronic systolic CHF (congestive heart failure) (HCC) Dementia without behavioral disturbance (HCC) Sigmoid volvulus (HCC) Hypokalemia Hypotension  Sigmoid Volvuluswith infarction, perforation, and feculent peritonitis - S/PExploratory laparotomy, sigmoid colectomy, descending colostomy. Dr. Harlow Asa, 11/28 -good ostomy output,okay to slowly increase TF's - qshift wet-to-dry dressing changes to midline wound - Protonix twice daily  RUE swelling -DVT studyneg  Bilirubinemia and elevated alk phos - could be 2/2 cholestasis, will monitor   FEN:NGT, TF's @ 70m /hr, protonix  VTE: SCD's, lovenox IJJ:KKXFG11/27-12/05,afebrile, WBC6.2 Foley:no Follow up:Dr.Gerkin  DISPO:TF's to 355mhr. Extubation per CCM.monitor labs    LOS: 6 days    Subjective: CC: on vent  Pt is shaking head yes or no to questions. He is following commands. He denies abdominal pain.   Objective: Vital signs in last 24 hours: Temp:  [98.9 F (37.2 C)-99.6 F (37.6 C)] 99.1 F (37.3 C) (12/03 0400) Pulse Rate:  [80-97] 82 (12/03 0000) Resp:  [18-21] 18 (12/03 0000) BP: (94-127)/(62-103) 103/66 (12/03 0000) SpO2:  [100 %] 100 % (12/03 0800) FiO2 (%):  [30 %] 30 % (12/03 0800) Weight:  [82.5 kg] 82.5 kg (12/03 0500) Last BM Date: 10/24/19(ostomy)  Intake/Output from previous day: 12/02 0701 - 12/03 0700 In: 930.3 [I.V.:90.5; NG/GT:740; IV Piggyback:99.9] Out: 1875 [Urine:1500; Stool:375] Intake/Output this shift: No intake/output data recorded.  PE:  GeHWE:XHBZJIRCVoff sedation and open eyes Card:regular rate Pulm:On vent Abd: Soft, ND,hypoactive BS, midline incision is clean, stoma is red and viable, stooland gasin bag.No guarding  with palpation of abdomen. Pt denies TTP. Skin: warm and dry Neuro: follows commands   Anti-infectives: Anti-infectives (From admission, onward)   Start     Dose/Rate Route Frequency Ordered Stop   10/19/19 2200  piperacillin-tazobactam (ZOSYN) IVPB 3.375 g     3.375 g 12.5 mL/hr over 240 Minutes Intravenous Every 8 hours 10/19/19 1802 10/27/19 0159   10/19/19 1700  vancomycin (VANCOCIN) 1,500 mg in sodium chloride 0.9 % 500 mL IVPB     1,500 mg 250 mL/hr over 120 Minutes Intravenous  Once 10/19/19 1636 10/19/19 1958   10/19/19 1630  ceFEPIme (MAXIPIME) 2 g in sodium chloride 0.9 % 100 mL IVPB     2 g 200 mL/hr over 30 Minutes Intravenous  Once 10/19/19 1629 10/19/19 1911      Lab Results:  Recent Labs    10/24/19 0410 10/25/19 0552  WBC 7.0 6.2  HGB 11.0* 10.3*  HCT 34.1* 32.4*  PLT 91* 91*   BMET Recent Labs    10/24/19 0410 10/25/19 0552  NA 146* 144  K 3.7 3.1*  CL 118* 112*  CO2 20* 22  GLUCOSE 120* 104*  BUN 39* 40*  CREATININE 1.03 1.11  CALCIUM 7.7* 7.8*   PT/INR No results for input(s): LABPROT, INR in the last 72 hours. CMP     Component Value Date/Time   NA 144 10/25/2019 0552   NA 143 09/10/2019 1042   K 3.1 (L) 10/25/2019 0552   CL 112 (H) 10/25/2019 0552   CO2 22 10/25/2019 0552   GLUCOSE 104 (H) 10/25/2019 0552   BUN 40 (H) 10/25/2019 0552   BUN 26 09/10/2019 1042   CREATININE 1.11 10/25/2019 0552   CREATININE 1.16 (H) 12/25/2015 1505   CALCIUM 7.8 (L) 10/25/2019 0552   PROT 4.4 (L) 10/25/2019 0552   ALBUMIN  2.1 (L) 10/25/2019 0552   AST 44 (H) 10/25/2019 0552   ALT 8 10/25/2019 0552   ALKPHOS 136 (H) 10/25/2019 0552   BILITOT 2.7 (H) 10/25/2019 0552   GFRNONAA >60 10/25/2019 0552   GFRAA >60 10/25/2019 0552   Lipase     Component Value Date/Time   LIPASE 20 10/19/2019 1400    Studies/Results: No results found.   Kalman Drape, PA-C [facility] Surgery Please see amion for pager for the following: M, T, W, &  Friday 7:00am - 4:30pm Thursdays 7:00am -11:30am  Agree with above. He remains intubated.  Alphonsa Overall, MD, [facility] Surgery Office phone:  [phone]

## 2019-10-25 NOTE — Progress Notes (Signed)
NAME:  Tomi Paddock, MRN:  [id number], DOB:  [birth year], LOS: 6 ADMISSION DATE:  10/19/2019, CONSULTATION DATE:  10/20/2019 REFERRING MD:  Loann Quill MD, CHIEF COMPLAINT: Acute abdomen, shock  Brief History   85 year old admitted with abdominal pain, sigmoid volvulus.  Seen by surgery and GI Underwent sigmoidoscopy on 11/27.  On 11/28 become increasingly unstable with elevated lactic acid and shock.  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 Failed decompression and went to OR for ex-lap, sigmoid colectomy and colostomy creation. Returned to ICU vented 11/29 - Weaning vent settings 12/2 off pressors for > 24 hours. Weaning but not strong enough or awake enough to come off vent. Advancing tube feeds. IV diuresis for third spacing. Still over 13 liters positive. 12/3: Still too weak to extubate Consults:  Surgery, GI, PCCM  Procedures:  11/28 - sigmoid colectomy with colostomy.   Significant Diagnostic Tests:  CT abdomen pelvis 10/19/2019-sigmoid volvulus with marked distention, bibasal lung opacities with small left effusion. CXR 11/28 - bibasilar infiltrates with small right pleural effusion.  Micro Data:  Blood cultures 11/27-negative  Antimicrobials:  Vanco, cefepime 11/27 Zosyn 11/27 >>  Interim history/subjective:  Still fairly weak and debilitated tolerating weaning  Objective   Blood pressure 123/82, pulse 76, temperature 99.1 F (37.3 C), temperature source Axillary, resp. rate 16, height 5' 8"  (1.727 m), weight 82.5 kg, SpO2 100 %.    Vent Mode: PSV;CPAP FiO2 (%):  [30 %] 30 % Set Rate:  [18 bmp] 18 bmp Vt Set:   [540 mL] 540 mL PEEP:  [5 cmH20] 5 cmH20 Pressure Support:  [10 cmH20] 10 cmH20 Plateau Pressure:  [17 cmH20-21 cmH20] 20 cmH20   Intake/Output Summary (Last 24 hours) at 10/25/2019 6734 Last data filed at 10/25/2019 0800 Gross per 24 hour  Intake 994.36 ml  Output 1875 ml  Net -880.64 ml   Filed Weights   10/23/19 0400 10/24/19 0419 10/25/19 0500  Weight: 86.3 kg 85 kg 82.5 kg   Physical Exam: General this is a chronically ill-appearing 85 year old male currently remains ventilatory dependent HEENT normocephalic atraumatic orally intubated mild scleral edema Pulmonary: Diminished bases no accessory use on pressure support of 10 Cardiac: Regular irregular with atrial fibrillation on telemetry.  Systolic murmur appreciated best over mitral region Abdomen: Soft, tolerating tube feeds.  Ostomy draining stool. Ext dependent edema/anasarca. Scattered areas of ecchymosis. Pulses are palp CR brisk Neuro more awake. Following commands. Not sure of orientation. Seems to have some left sided weakness but not sure what baseline is  gu clear yellow  Resolved Hospital Problem list   Sigmoid volvulus.  Septic shock Nonanion gap metabolic acidosis resolved, this was from hyperchloremia Assessment & Plan:    Sigmoid volvulus with infarction, perforation and peritonitis s/p sigmoid colectomy and colostomy creation on 11/28 Plan Continuing wound care as directed by surgical services  Continuing supplemental tube feeds  Today is day #7 of 7 antibiotics  acute hypoxemic respiratory failure mechanical ventilation status post above Is primary barrier to extubation  at this point continues to be a deconditioning. I do not think he is ready for extubation yet, but appears closer today than yesterday Plan Continuing pressure support ventilation as tolerated VAP bundle PAD  protocol with RASS goal of 0 Continue IV diuresis X-ray today Initiate mobilization is seen in his mental status will allow   AF  -No longer in rapid ventricular response, had required amiodarone he is now off Plan Continue telemetry monitoring  Continue metoprolol 12.5 mg via tube twice daily  Eventually will need to consider systemic anticoagulation for now we will continue venous ulcer prophylaxis dosing   Parkinson's disease.  Possible element of baseline dementia Plan Continue home regimen of Symmetrel, carbidopa/levodopa and Aricept  He was on clonazepam 0.25 mg twice daily at home, however this looks like it had only been prescribed for about 1 month prior it does not appear to be a long-term medication, we are tapering this To off at this point.  We will continue it at bedtime for now  Fluid and electrolyte balance hypernatremia, hyperchloremia and hypokalemia, total fluid body overload Sodium has normalized chloride improving Remains 12.2 L positive Plan Continue current free water replacement at 200 mL every 6 hours Replace potassium Add/continue Lasix A.m. chemistry  Rising LFTs, alk phos and bilirubin -Question mild cholestasis Plan Continue to trend May need ultrasound if continues to climb  Mild thrombocytopenia Plan Trending CBC this is been stable  Severe protein calorie malnutrition Plan Continue tube feeds, slowly advancing per surgical services  Severe physical deconditioning Plan Continue passive range of motion Physical therapy when mental status will allow Adding ankle boots for foot drop  Daily Goals Checklist  Pain/Anxiety/Delirium protocol (if indicated): keep off sedation VAP protocol (if indicated): Bundle in place..  DVT prophylaxis: Lovenox 66m/d Nutritional status and feeding goals: NGT for meds, trickle feed High nutritional risk. GI prophylaxis: protonix Central lines: L [place] TLC, arterial line removed. Glucose control: euglycemic on not coverage Code Status: full Family Communication: wife updated at bedside. Disposition: ICU   cct 34 minutes  PErick ColaceACNP-BC LGreenbrierPager # [phone] # [phone] no answer

## 2019-10-26 ENCOUNTER — Inpatient Hospital Stay (HOSPITAL_COMMUNITY): Payer: Medicare Other

## 2019-10-26 DIAGNOSIS — F0390 Unspecified dementia without behavioral disturbance: Secondary | ICD-10-CM | POA: Diagnosis not present

## 2019-10-26 DIAGNOSIS — J9601 Acute respiratory failure with hypoxia: Secondary | ICD-10-CM | POA: Diagnosis not present

## 2019-10-26 DIAGNOSIS — J90 Pleural effusion, not elsewhere classified: Secondary | ICD-10-CM

## 2019-10-26 DIAGNOSIS — K562 Volvulus: Secondary | ICD-10-CM | POA: Diagnosis not present

## 2019-10-26 LAB — CBC WITH DIFFERENTIAL/PLATELET
Abs Immature Granulocytes: 0.12 10*3/uL — ABNORMAL HIGH (ref 0.00–0.07)
Basophils Absolute: 0 10*3/uL (ref 0.0–0.1)
Basophils Relative: 0 %
Eosinophils Absolute: 0.1 10*3/uL (ref 0.0–0.5)
Eosinophils Relative: 1 %
HCT: 34.7 % — ABNORMAL LOW (ref 39.0–52.0)
Hemoglobin: 11.3 g/dL — ABNORMAL LOW (ref 13.0–17.0)
Immature Granulocytes: 2 %
Lymphocytes Relative: 11 %
Lymphs Abs: 0.8 10*3/uL (ref 0.7–4.0)
MCH: 31.7 pg (ref 26.0–34.0)
MCHC: 32.6 g/dL (ref 30.0–36.0)
MCV: 97.2 fL (ref 80.0–100.0)
Monocytes Absolute: 0.5 10*3/uL (ref 0.1–1.0)
Monocytes Relative: 7 %
Neutro Abs: 5.6 10*3/uL (ref 1.7–7.7)
Neutrophils Relative %: 79 %
Platelets: 112 10*3/uL — ABNORMAL LOW (ref 150–400)
RBC: 3.57 MIL/uL — ABNORMAL LOW (ref 4.22–5.81)
RDW: 16.6 % — ABNORMAL HIGH (ref 11.5–15.5)
WBC: 7.1 10*3/uL (ref 4.0–10.5)
nRBC: 0 % (ref 0.0–0.2)

## 2019-10-26 LAB — PROTIME-INR
INR: 1.3 — ABNORMAL HIGH (ref 0.8–1.2)
Prothrombin Time: 15.9 seconds — ABNORMAL HIGH (ref 11.4–15.2)

## 2019-10-26 LAB — TRIGLYCERIDES: Triglycerides: 110 mg/dL (ref <150)

## 2019-10-26 LAB — COMPREHENSIVE METABOLIC PANEL
ALT: 9 U/L (ref 0–44)
AST: 46 U/L — ABNORMAL HIGH (ref 15–41)
Albumin: 2.1 g/dL — ABNORMAL LOW (ref 3.5–5.0)
Alkaline Phosphatase: 165 U/L — ABNORMAL HIGH (ref 38–126)
Anion gap: 11 (ref 5–15)
BUN: 35 mg/dL — ABNORMAL HIGH (ref 8–23)
CO2: 23 mmol/L (ref 22–32)
Calcium: 7.8 mg/dL — ABNORMAL LOW (ref 8.9–10.3)
Chloride: 109 mmol/L (ref 98–111)
Creatinine, Ser: 1.01 mg/dL (ref 0.61–1.24)
GFR calc Af Amer: >60 mL/min (ref >60)
GFR calc non Af Amer: >60 mL/min (ref >60)
Glucose, Bld: 97 mg/dL (ref 70–99)
Potassium: 3.2 mmol/L — ABNORMAL LOW (ref 3.5–5.1)
Sodium: 143 mmol/L (ref 135–145)
Total Bilirubin: 2.4 mg/dL — ABNORMAL HIGH (ref 0.3–1.2)
Total Protein: 4.9 g/dL — ABNORMAL LOW (ref 6.5–8.1)

## 2019-10-26 LAB — GLUCOSE, CAPILLARY
Glucose-Capillary: 100 mg/dL — ABNORMAL HIGH (ref 70–99)
Glucose-Capillary: 104 mg/dL — ABNORMAL HIGH (ref 70–99)
Glucose-Capillary: 105 mg/dL — ABNORMAL HIGH (ref 70–99)
Glucose-Capillary: 92 mg/dL (ref 70–99)
Glucose-Capillary: 94 mg/dL (ref 70–99)

## 2019-10-26 MED ORDER — POTASSIUM CHLORIDE 20 MEQ/15ML (10%) PO SOLN
40.0000 meq | Freq: Two times a day (BID) | ORAL | Status: AC
  Administered 2019-10-26 (×2): 40 meq
  Filled 2019-10-26 (×2): qty 30

## 2019-10-26 NOTE — Procedures (Signed)
Extubation Procedure Note  Patient Details:   Name: [name] [name] DOB: [birth year] MRN: [id number]   Airway Documentation:  Airway 8 mm (Active)  Secured at (cm) 24 cm 10/26/19 0748  Measured From Lips 10/26/19 [place] 10/26/19 0748  Secured By [employer] 10/26/19 0748  Tube Holder Repositioned Yes 10/26/19 0748  Cuff Pressure (cm H2O) 25 cm H2O 10/26/19 0748  Site Condition Dry 10/26/19 0320   Vent end date: (not recorded) Vent end time: (not recorded)   Evaluation  O2 sats: 201 Complications:none Patient tolerated procedure well. Bilateral Breath Sounds: Clear   Pt able to speak  Per CCM order, pt extubated and placed on nasal cannula.  Pt tolerated procedure well with no complications.  Martha Clan 10/26/2019, 9:34 AM

## 2019-10-26 NOTE — Progress Notes (Addendum)
Patient ID: [name] [name], male   DOB: July 23, 1932, 85 y.o.   MRN: [id number]    6 Days Post-Op  Subjective: Sleepy but answers questions appropriately on the vent.  Just flipped to pressure support and doing well weaning.  Tolerating 30cc/hr of TFs  ROS: unable, on vent  Objective: Vital signs in last 24 hours: Temp:  [97.9 F (36.6 C)-100.6 F (38.1 C)] 99.7 F (37.6 C) (12/04 0351) Pulse Rate:  [70-90] 90 (12/04 0600) Resp:  [15-19] 18 (12/04 0600) BP: (89-119)/(56-90) 96/63 (12/04 0600) SpO2:  [99 %-100 %] 100 % (12/04 0748) FiO2 (%):  [30 %] 30 % (12/04 0748) Weight:  [78.5 kg] 78.5 kg (12/04 0438) Last BM Date: 10/24/19(ostomy)  Intake/Output from previous day: 12/03 0701 - 12/04 0700 In: 395.4 [I.V.:54.7; NG/GT:191; IV Piggyback:149.7] Out: 3300 [Urine:2950; Stool:350] Intake/Output this shift: No intake/output data recorded.  PE: Heart: regular Lungs: CTAB Abd: soft, minimally tender, midline incisions is clean and packed, fascia intact, colostomy with liquid stool present and flatus.  Stoma is pink and viable  Lab Results:  Recent Labs    10/25/19 0552 10/26/19 0326  WBC 6.2 7.1  HGB 10.3* 11.3*  HCT 32.4* 34.7*  PLT 91* 112*   BMET Recent Labs    10/25/19 0552 10/26/19 0326  NA 144 143  K 3.1* 3.2*  CL 112* 109  CO2 22 23  GLUCOSE 104* 97  BUN 40* 35*  CREATININE 1.11 1.01  CALCIUM 7.8* 7.8*   PT/INR No results for input(s): LABPROT, INR in the last 72 hours. CMP     Component Value Date/Time   NA 143 10/26/2019 0326   NA 143 09/10/2019 1042   K 3.2 (L) 10/26/2019 0326   CL 109 10/26/2019 0326   CO2 23 10/26/2019 0326   GLUCOSE 97 10/26/2019 0326   BUN 35 (H) 10/26/2019 0326   BUN 26 09/10/2019 1042   CREATININE 1.01 10/26/2019 0326   CREATININE 1.16 (H) 12/25/2015 1505   CALCIUM 7.8 (L) 10/26/2019 0326   PROT 4.9 (L) 10/26/2019 0326   ALBUMIN 2.1 (L) 10/26/2019 0326   AST 46 (H) 10/26/2019 0326   ALT 9 10/26/2019 0326   ALKPHOS 165  (H) 10/26/2019 0326   BILITOT 2.4 (H) 10/26/2019 0326   GFRNONAA >60 10/26/2019 0326   GFRAA >60 10/26/2019 0326   Lipase     Component Value Date/Time   LIPASE 20 10/19/2019 1400       Studies/Results: Dg Chest Port 1 View  Result Date: 10/25/2019 CLINICAL DATA:  Acute respiratory failure/hypoxia. EXAM: PORTABLE CHEST 1 VIEW COMPARISON:  10/22/2019 FINDINGS: Endotracheal tube has tip 4.8 cm above the carina. Enteric tube has tip and side-port over the stomach in the left upper quadrant. Left subclavian central venous catheter unchanged with tip over the SVC. Loop recorder projects over the left are unchanged. Lungs are adequately inflated and demonstrate persistent hazy opacification over the right mid to lower lung and left base likely layering effusions with associated atelectasis. Infection in the lung bases is possible. No pneumothorax. Mild stable cardiomegaly. Remainder of the exam is unchanged. IMPRESSION: 1. Stable bilateral pleural effusions right greater than left likely with associated atelectasis. Infection in the lung bases is possible. 2.  Tubes and lines as described. Electronically Signed   By: Elberta Fortis M.D.   On: 10/25/2019 09:54    Anti-infectives: Anti-infectives (From admission, onward)   Start     Dose/Rate Route Frequency Ordered Stop   10/19/19 2200  piperacillin-tazobactam (ZOSYN)  IVPB 3.375 g     3.375 g 12.5 mL/hr over 240 Minutes Intravenous Every 8 hours 10/19/19 1802 10/27/19 0159   10/19/19 1700  vancomycin (VANCOCIN) 1,500 mg in sodium chloride 0.9 % 500 mL IVPB     1,500 mg 250 mL/hr over 120 Minutes Intravenous  Once 10/19/19 1636 10/19/19 1958   10/19/19 1630  ceFEPIme (MAXIPIME) 2 g in sodium chloride 0.9 % 100 mL IVPB     2 g 200 mL/hr over 30 Minutes Intravenous  Once 10/19/19 1629 10/19/19 1911       Assessment/Plan Parkinson's disease ([place]) Displaced intertrochanteric fracture of left femur, initial encounter for closed fracture  (HCC) Chronic systolic CHF (congestive heart failure) ([place]) Dementia without behavioral disturbance (HCC) Sigmoid volvulus (HCC) Hypokalemia Hypotension RUE swelling -DVT studyneg Transaminitis - stable VDRF - per CCM  POD 6, S/PExploratory laparotomy, sigmoid colectomy, descending colostomy. Dr. Harlow Asa for Sigmoid Volvuluswith infarction, perforation, and feculent peritonitis  -goodostomy output,will have dietitian adv TFs per protocol at this point; however, if he gets extubated today will defer need for swallow eval to CCM, but can have CLD from our standpoint  - qshift wet-to-dry dressing changes to midline wound  - Protonix twice daily  -once extubated pulm toilet and IS  -mobilize as able once extubated as well, will need PT/OT  FEN:TF, protonix  VTE: SCD's, lovenox, ok coumadin from surgery standpoint GG:EZMOQ 11/27-12/05,afebrile, WBC6.2 Foley:no Follow up:Dr.Gerkin    LOS: 7 days    Henreitta Cea , [facility] 10/26/2019, 8:06 AM Please see Amion for pager number during day hours 7:00am-4:30pm  Agree with above. Bowel is working.  Alphonsa Overall, MD, [facility] Surgery Office phone:  [phone]

## 2019-10-26 NOTE — Progress Notes (Signed)
Nutrition Follow-up  DOCUMENTATION CODES:   Not applicable  INTERVENTION:  - diet advancement as medically feasible. - if unable to advance diet today and NGT remains in place, will order Osmolite 1.5 @ 35 ml/hr to advance by 10 ml every 8 hours to 55 ml/hr with 30 ml prostat once/day. - at goal rate, this regimen will provide 2080 kcal, 104 grams protein, and 1006 ml free water.    NUTRITION DIAGNOSIS:   Increased nutrient needs related to acute illness as evidenced by estimated needs. -revised  GOAL:   Patient will meet greater than or equal to 90% of their needs -unmet at this time  MONITOR:   Diet advancement, Labs, Weight trends  REASON FOR ASSESSMENT:   Consult Enteral/tube feeding initiation and management  ASSESSMENT:   85 year old admitted with abdominal pain, sigmoid volvulus.  Seen by surgery and GIUnderwent sigmoidoscopy on 11/27.  On 11/28 become increasingly unstable with elevated lactic acid and shock.  Significant Events: 11/26- intubation 11/27- sigmoidoscopy 11/28- ex lap, sigmoid colectomy, colostomy creation 11/29- NGT placement 12/4- extubation   Order in place for Vital AF 1.2 @ 30 ml/hr with 200 ml free water QID. This regimen provides 864 kcal, 54 grams protein, and 1384 ml free water.  He was extubated today at ~0940. NGT remains in place. Able to communicate with RN at bedside. Plan to attempt swallow assessment at bedside and will assess if diet advancement is feasible or if TF needs to be re-started. Re-estimated nutrition needs today.   Labs reviewed; CBGs: 92 and 100 mg/dl, K: 3.2 mmol/l, BUN: 35 mg/dl, Ca: 7.8 mg/dl. Medications reviewed; 40 mg IV lasix BID, 40 mg IV protonix BID, 40 mEq KCl per NGT x2 doses 12/4.     NUTRITION - FOCUSED PHYSICAL EXAM:    Most Recent Value  Orbital Region  No depletion  Upper Arm Region  No depletion  Thoracic and Lumbar Region  Unable to assess  Buccal Region  Mild depletion  Temple Region  No  depletion  Clavicle Bone Region  Mild depletion  Clavicle and Acromion Bone Region  Mild depletion  Scapular Bone Region  Unable to assess  Dorsal Hand  Unable to assess  Patellar Region  No depletion  Anterior Thigh Region  No depletion  Posterior Calf Region  No depletion  Edema (RD Assessment)  Moderate [all extremities]  Hair  Reviewed  Eyes  Other (Comment)  Mouth  Other (Comment)  Skin  Reviewed  Nails  Reviewed       Diet Order:   Diet Order            Diet NPO time specified  Diet effective now              EDUCATION NEEDS:   No education needs have been identified at this time  Skin:  Skin Assessment: Reviewed RN Assessment  Last BM:  12/4  Height:   Ht Readings from Last 1 Encounters:  10/20/19 5\' 8"  (1.727 m)    Weight:   Wt Readings from Last 1 Encounters:  10/26/19 78.5 kg    Ideal Body Weight:  70 kg  BMI:  Body mass index is 26.31 kg/m.  Estimated Nutritional Needs:   Kcal:  2080-2290 kcal  Protein:  85-100 grams  Fluid:  >/= 2 L/day     Jarome Matin, MS, RD, LDN, [facility] Inpatient Clinical Dietitian Pager # [phone] After hours/weekend pager # [phone]

## 2019-10-26 NOTE — Progress Notes (Signed)
[place] for Warfarin Indication: MV angioplasty  Allergies  Allergen Reactions  . Apixaban Palpitations, Rash and Other (See Comments)    Worsening tardive dyskinesia and rapid heart rate  . Ace Inhibitors Cough  . Demerol [Meperidine] Other (See Comments)    Parkinsons disease  . Poison Ivy Extract Itching, Swelling and Rash   Patient Measurements: Height: 5' 8"  (172.7 cm) Weight: 173 lb 1 oz (78.5 kg) IBW/kg (Calculated) : 68.4  Vital Signs: Temp: 98.3 F (36.8 C) (12/04 1200) Temp Source: Axillary (12/04 1200) BP: 97/65 (12/04 1200) Pulse Rate: 78 (12/04 1200)  Labs: Recent Labs    10/24/19 0410 10/25/19 0552 10/26/19 0326  HGB 11.0* 10.3* 11.3*  HCT 34.1* 32.4* 34.7*  PLT 91* 91* 112*  CREATININE 1.03 1.11 1.01   Estimated Creatinine Clearance: 51.7 mL/min (by C-G formula based on SCr of 1.01 mg/dL).  Medical History: Past Medical History:  Diagnosis Date  . Dyslipidemia   . Parkinson's disease ()   . S/P mitral valve replacement    mitral regurg, endocarditis  . Stroke [facility])    Medications:  Scheduled:  . amantadine  100 mg Per Tube 2 times per day  . Carbidopa-Levodopa ER  1 capsule Per NG tube QHS  . Carbidopa-Levodopa ER  2 capsule Per NG tube Daily  . Carbidopa-Levodopa ER  3 capsule Per NG tube 2 times per day  . chlorhexidine gluconate (MEDLINE KIT)  15 mL Mouth Rinse BID  . Chlorhexidine Gluconate Cloth  6 each Topical Daily  . donepezil  10 mg Per Tube QHS  . enoxaparin (LOVENOX) injection  40 mg Subcutaneous Q24H  . escitalopram  10 mg Per Tube Daily  . free water  200 mL Per Tube Q6H  . furosemide  40 mg Intravenous Q12H  . mouth rinse  15 mL Mouth Rinse 10 times per day  . metoprolol tartrate  12.5 mg Per Tube BID  . pantoprazole (PROTONIX) IV  40 mg Intravenous Q12H  . potassium chloride  40 mEq Per Tube BID  . rotigotine  1 patch Transdermal Daily  . sodium chloride flush  10-40 mL  Intracatheter Q12H   Assessment:  77 yoM with sigmoid volvulus perforation > feculent peritonitis, 11/27 sigmoidoscopy, 11/28 colectomy with colostomy.  Intubated 11/28 >> 12/4 extubated, plan resume Warfarin once passes swallow study. INR goal 2-3 per Coumadin clinic notes. PTA Warfarin 2.57m daily, LD 11/26  Goal of Therapy:  INR 2-3 Monitor platelets by anticoagulation protocol: Yes   Plan:  Await formal swallow study Daily Protime/INR from 12/5  GMinda DittoPharmD 10/26/2019,2:38 PM

## 2019-10-26 NOTE — Progress Notes (Addendum)
NAME:  [name] [name], MRN:  [id number], DOB:  [birth year], LOS: 7 ADMISSION DATE:  10/19/2019, CONSULTATION DATE:  10/20/2019 REFERRING MD:  Loann Quill MD, CHIEF COMPLAINT: Acute abdomen, shock  Brief History   85 year old admitted with abdominal pain, sigmoid volvulus.  Seen by surgery and GI Underwent sigmoidoscopy on 11/27.  On 11/28 become increasingly unstable with elevated lactic acid and shock.  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 Failed decompression and went to OR for ex-lap, sigmoid colectomy and colostomy creation. Returned to ICU vented 11/29 - Weaning vent settings 12/2 off pressors for > 24 hours. Weaning but not strong enough or awake enough to come off vent. Advancing tube feeds. IV diuresis for third spacing. Still over 13 liters positive. 12/3: Still too weak to extubate 12/4: Extubated Consults:  Surgery, GI, PCCM  Procedures:  11/28 - sigmoid colectomy with colostomy.   Significant Diagnostic Tests:  CT abdomen pelvis 10/19/2019-sigmoid volvulus with marked distention, bibasal lung opacities with small left effusion. CXR 11/28 - bibasilar infiltrates with small right pleural effusion.  Micro Data:  Blood cultures 11/27-negative  Antimicrobials:  Vanco, cefepime 11/27 Zosyn 11/27 >>  Interim history/subjective:  He has passed his spontaneous breathing trial and is ready for extubation Objective   Blood pressure 96/63, pulse 90, temperature 99.7 F (37.6 C), temperature source Axillary, resp. rate 18, height _0  (1.727 m), weight 78.5 kg, SpO2 100 %.    Vent Mode: PRVC FiO2 (%):  [30 %] 30 % Set  Rate:  [18 bmp] 18 bmp Vt Set:  [540 mL] 540 mL PEEP:  [5 cmH20] 5 cmH20 Pressure Support:  [10 cmH20] 10 cmH20 Plateau Pressure:  [13 cmH20-25 cmH20] 16 cmH20   Intake/Output Summary (Last 24 hours) at 10/26/2019 0834 Last data filed at 10/26/2019 9675 Gross per 24 hour  Intake 331.32 ml  Output 3300 ml  Net -2968.68 ml   Filed Weights   10/24/19 0419 10/25/19 0500 10/26/19 0438  Weight: 85 kg 82.5 kg 78.5 kg   Physical Exam: General: This is a chronically ill-appearing 85 year old white male he is resting in bed and currently on pressure support ventilation he has passed his spontaneous breathing trial HEENT normocephalic atraumatic no jugular venous distention is appreciated orally intubated mucous membranes moist nasogastric tube in place Pulmonary: Clear to auscultation no accessory use no nasal flare frequency/tidal volume ratio acceptable on spontaneous breathing trial diminished breath sounds in the bases but otherwise unremarkable Cardiac: Regular irregular with systolic murmur appreciated best heard at mitral region Abdomen: Soft, tolerating tube feed.  Ostomy is patent.  Draining stool. Tolerating tube feeds GU: Clear yellow urine  Neuro: Opens eyes briskly, nods head appropriately, follows commands.  He still profoundly weak however his strength is improving day by day  Resolved Hospital Problem list   Sigmoid volvulus.  Septic shock Nonanion gap metabolic acidosis resolved, this was from hyperchloremia Hypernatremia Assessment & Plan:    Sigmoid volvulus with infarction, perforation and peritonitis s/p sigmoid colectomy and colostomy creation on 11/28 Plan Continue wound care as directed by surgical services  Tube feeds as mentioned below  Discontinue antibiotics once he has completed 7 days  acute hypoxemic respiratory failure mechanical ventilation status post  above Primary barrier to extubation has been his mental status he is more awake today and has passed  his spontaneous breathing trial  Portable chest x-ray from yesterday demonstrated endotracheal tube in satisfactory position There appears to be right sided effusion/atelectasis some volume loss on the left Plan Extubate Wean oxygen, for saturations greater than 90% N.p.o., advance as tolerated pending mental status Aspiration and reflux precautions A.m. chest x-ray Spirometry Mobilize X-ray in a.m., may need to consider ultrasound right chest and possible thoracentesis pending chest x-rays if right effusion persists  AF  -No longer in rapid ventricular response, had required amiodarone he is now off Plan Continue clonazepam Continue telemetry monitoring Continue metoprolol with hold orders Eventually we need to consider systemic anticoagulation, will continue to hold this for now, with his underlying dementia he may be high fall risk after this illness which may make systemic anticoagulation more contraindicated   Parkinson's disease.  Possible element of baseline dementia Plan Continuing Symmetrel, carbidopa/levodopa  I spoke with his wife, clonazepam was not a chronic medication we will discontinue this he has done poorly with benzodiazepines in the past particularly Ativan which we should list as an allergy  Continue Aricept  Frequent reorientation interventions and delirium prevention interventions  Get him out of bed   Fluid and electrolyte imbalance: Total body volume overload and hypokalemia  9.2 L positive, this continues to improve he is nearing euvolemia plan Replace potassium  A.m. chemistry  Continue free water replacement at 200 every 6 hours  Continue Lasix as long as blood pressure, BUN and creatinine tolerate    Rising LFTs, alk phos and bilirubin -Question mild cholestasis Plan Continue to trend, will eventually need ultrasound if this continues to climb  Mild thrombocytopenia Plan Continue to trend  Severe protein calorie malnutrition Plan He is  n.p.o. for extubation, will assess readiness to swallow if not resume feeds later today    Severe physical deconditioning Plan Get him out of bed Passive range of motion, we will also educate wife to perform this while at bedside We will initiate physical therapy consultation once he is able to participate  Daily Goals Checklist  Pain/Anxiety/Delirium protocol (if indicated): keep off sedation VAP protocol (if indicated): Bundle in place..  DVT prophylaxis: Lovenox 39m/d Nutritional status and feeding goals: NGT for meds, trickle feed High nutritional risk. GI prophylaxis: protonix Central lines: L [place] TLC, arterial line removed. Glucose control: euglycemic on not coverage Code Status: full Family Communication: wife updated at bedside. Disposition: ICU  cct 32 minutes.   PErick ColaceACNP-BC LThe WoodlandsPager # [phone] # [phone] no answer

## 2019-10-27 ENCOUNTER — Inpatient Hospital Stay (HOSPITAL_COMMUNITY): Payer: Medicare Other

## 2019-10-27 DIAGNOSIS — K562 Volvulus: Secondary | ICD-10-CM | POA: Diagnosis not present

## 2019-10-27 DIAGNOSIS — J9601 Acute respiratory failure with hypoxia: Secondary | ICD-10-CM | POA: Diagnosis not present

## 2019-10-27 DIAGNOSIS — G20 Parkinson's disease: Secondary | ICD-10-CM | POA: Diagnosis not present

## 2019-10-27 LAB — GLUCOSE, CAPILLARY
Glucose-Capillary: 102 mg/dL — ABNORMAL HIGH (ref 70–99)
Glucose-Capillary: 107 mg/dL — ABNORMAL HIGH (ref 70–99)
Glucose-Capillary: 115 mg/dL — ABNORMAL HIGH (ref 70–99)
Glucose-Capillary: 67 mg/dL — ABNORMAL LOW (ref 70–99)
Glucose-Capillary: 76 mg/dL (ref 70–99)
Glucose-Capillary: 83 mg/dL (ref 70–99)
Glucose-Capillary: 85 mg/dL (ref 70–99)

## 2019-10-27 LAB — CBC WITH DIFFERENTIAL/PLATELET
Abs Immature Granulocytes: 0.18 10*3/uL — ABNORMAL HIGH (ref 0.00–0.07)
Basophils Absolute: 0 10*3/uL (ref 0.0–0.1)
Basophils Relative: 0 %
Eosinophils Absolute: 0.1 10*3/uL (ref 0.0–0.5)
Eosinophils Relative: 2 %
HCT: 36.3 % — ABNORMAL LOW (ref 39.0–52.0)
Hemoglobin: 11.4 g/dL — ABNORMAL LOW (ref 13.0–17.0)
Immature Granulocytes: 2 %
Lymphocytes Relative: 10 %
Lymphs Abs: 0.9 10*3/uL (ref 0.7–4.0)
MCH: 31.2 pg (ref 26.0–34.0)
MCHC: 31.4 g/dL (ref 30.0–36.0)
MCV: 99.5 fL (ref 80.0–100.0)
Monocytes Absolute: 0.4 10*3/uL (ref 0.1–1.0)
Monocytes Relative: 4 %
Neutro Abs: 7.1 10*3/uL (ref 1.7–7.7)
Neutrophils Relative %: 82 %
Platelets: 138 10*3/uL — ABNORMAL LOW (ref 150–400)
RBC: 3.65 MIL/uL — ABNORMAL LOW (ref 4.22–5.81)
RDW: 16.5 % — ABNORMAL HIGH (ref 11.5–15.5)
WBC: 8.7 10*3/uL (ref 4.0–10.5)
nRBC: 0 % (ref 0.0–0.2)

## 2019-10-27 LAB — PROTIME-INR
INR: 1.3 — ABNORMAL HIGH (ref 0.8–1.2)
Prothrombin Time: 16.3 seconds — ABNORMAL HIGH (ref 11.4–15.2)

## 2019-10-27 MED ORDER — POTASSIUM CHLORIDE 20 MEQ/15ML (10%) PO SOLN
40.0000 meq | ORAL | Status: AC
  Administered 2019-10-27 (×2): 40 meq
  Filled 2019-10-27 (×2): qty 30

## 2019-10-27 MED ORDER — WARFARIN SODIUM 5 MG PO TABS
5.0000 mg | ORAL_TABLET | Freq: Once | ORAL | Status: AC
  Administered 2019-10-27: 5 mg
  Filled 2019-10-27: qty 1

## 2019-10-27 MED ORDER — WARFARIN - PHARMACIST DOSING INPATIENT
Freq: Every day | Status: DC
  Administered 2019-10-27: 18:00:00

## 2019-10-27 MED ORDER — VITAL AF 1.2 CAL PO LIQD
1000.0000 mL | ORAL | Status: AC
  Administered 2019-10-27: 1000 mL

## 2019-10-27 NOTE — Progress Notes (Signed)
Patient ID: [name] [name], male   DOB: [birth year], 85 y.o.   MRN: [id number]    7 Days Post-Op  Subjective: Sleepy but conversant - extubated yesterday.  Tol TFs via NG tube  Objective: Vital signs in last 24 hours: Temp:  [97.7 F (36.5 C)-98.7 F (37.1 C)] 97.7 F (36.5 C) (12/05 0800) Pulse Rate:  [75-88] 76 (12/05 0600) Resp:  [17-24] 20 (12/05 0600) BP: (81-121)/(65-81) 121/81 (12/05 0600) SpO2:  [100 %] 100 % (12/05 0600) Weight:  [75.5 kg] 75.5 kg (12/05 0500) Last BM Date: 10/24/19(ostomy)  Intake/Output from previous day: 12/04 0701 - 12/05 0700 In: 94.1 [IV Piggyback:94.1] Out: 4500 [Urine:4125; Stool:375] Intake/Output this shift: No intake/output data recorded.  PE: Heart: regular Lungs: CTAB Abd: soft, minimally tender, midline incisions is clean and packed, fascia intact, colostomy with liquid stool present and flatus.  Stoma is pink and viable  Lab Results:  Recent Labs    10/26/19 0326 10/27/19 0325  WBC 7.1 8.7  HGB 11.3* 11.4*  HCT 34.7* 36.3*  PLT 112* 138*   BMET Recent Labs    10/25/19 0552 10/26/19 0326  NA 144 143  K 3.1* 3.2*  CL 112* 109  CO2 22 23  GLUCOSE 104* 97  BUN 40* 35*  CREATININE 1.11 1.01  CALCIUM 7.8* 7.8*   PT/INR Recent Labs    10/26/19 1844 10/27/19 0325  LABPROT 15.9* 16.3*  INR 1.3* 1.3*   CMP     Component Value Date/Time   NA 143 10/26/2019 0326   NA 143 09/10/2019 1042   K 3.2 (L) 10/26/2019 0326   CL 109 10/26/2019 0326   CO2 23 10/26/2019 0326   GLUCOSE 97 10/26/2019 0326   BUN 35 (H) 10/26/2019 0326   BUN 26 09/10/2019 1042   CREATININE 1.01 10/26/2019 0326   CREATININE 1.16 (H) 12/25/2015 1505   CALCIUM 7.8 (L) 10/26/2019 0326   PROT 4.9 (L) 10/26/2019 0326   ALBUMIN 2.1 (L) 10/26/2019 0326   AST 46 (H) 10/26/2019 0326   ALT 9 10/26/2019 0326   ALKPHOS 165 (H) 10/26/2019 0326   BILITOT 2.4 (H) 10/26/2019 0326   GFRNONAA >60 10/26/2019 0326   GFRAA >60 10/26/2019 0326   Lipase      Component Value Date/Time   LIPASE 20 10/19/2019 1400       Studies/Results: Dg Chest Port 1 View  Result Date: 10/27/2019 CLINICAL DATA:  Acute respiratory failure with hypoxia EXAM: PORTABLE CHEST 1 VIEW COMPARISON:  Radiograph 10/26/2019 FINDINGS: Interval extubation. NG tube remains in stomach. Sternotomy wires overlie stable cardiac silhouette. There bilateral pleural effusions unchanged. No pulmonary edema. IMPRESSION: 1. Interval extubation without complication. 2. No change in bilateral pleural effusions. Electronically Signed   By: Suzy Bouchard M.D.   On: 10/27/2019 07:28   Dg Chest Port 1 View  Result Date: 10/26/2019 CLINICAL DATA:  Pleural effusion shock, recent sigmoid volvulus and sigmoidoscopy EXAM: PORTABLE CHEST 1 VIEW COMPARISON:  Chest radiograph from one day prior. FINDINGS: Endotracheal tube tip is 4.7 cm above the carina. Enteric tube enters stomach with the tip not seen on this image. Left subclavian central venous catheter terminates in the left brachiocephalic vein just to the right of midline. Intact sternotomy wires. Loop recorder overlies the left chest. Mitral annuloplasty ring in place. Stable cardiomediastinal silhouette with mild cardiomegaly. No pneumothorax. Small bilateral pleural effusions, right greater than left, stable with stable bibasilar atelectasis. No overt pulmonary edema. IMPRESSION: 1. Well-positioned support structures.  No pneumothorax. 2. Small  bilateral pleural effusions with bibasilar atelectasis, right greater than left, stable. 3. Stable cardiomegaly without overt pulmonary edema. Electronically Signed   By: Delbert Phenix M.D.   On: 10/26/2019 09:26   Dg Chest Port 1 View  Result Date: 10/25/2019 CLINICAL DATA:  Acute respiratory failure/hypoxia. EXAM: PORTABLE CHEST 1 VIEW COMPARISON:  10/22/2019 FINDINGS: Endotracheal tube has tip 4.8 cm above the carina. Enteric tube has tip and side-port over the stomach in the left upper quadrant. Left  subclavian central venous catheter unchanged with tip over the SVC. Loop recorder projects over the left are unchanged. Lungs are adequately inflated and demonstrate persistent hazy opacification over the right mid to lower lung and left base likely layering effusions with associated atelectasis. Infection in the lung bases is possible. No pneumothorax. Mild stable cardiomegaly. Remainder of the exam is unchanged. IMPRESSION: 1. Stable bilateral pleural effusions right greater than left likely with associated atelectasis. Infection in the lung bases is possible. 2.  Tubes and lines as described. Electronically Signed   By: Elberta Fortis M.D.   On: 10/25/2019 09:54    Anti-infectives: Anti-infectives (From admission, onward)   Start     Dose/Rate Route Frequency Ordered Stop   10/19/19 2200  piperacillin-tazobactam (ZOSYN) IVPB 3.375 g  Status:  Discontinued     3.375 g 12.5 mL/hr over 240 Minutes Intravenous Every 8 hours 10/19/19 1802 10/27/19 0159   10/19/19 1700  vancomycin (VANCOCIN) 1,500 mg in sodium chloride 0.9 % 500 mL IVPB     1,500 mg 250 mL/hr over 120 Minutes Intravenous  Once 10/19/19 1636 10/19/19 1958   10/19/19 1630  ceFEPIme (MAXIPIME) 2 g in sodium chloride 0.9 % 100 mL IVPB     2 g 200 mL/hr over 30 Minutes Intravenous  Once 10/19/19 1629 10/19/19 1911       Assessment/Plan Parkinson's disease (HCC) Displaced intertrochanteric fracture of left femur, initial encounter for closed fracture (HCC) Chronic systolic CHF (congestive heart failure) (HCC) Dementia without behavioral disturbance (HCC) Sigmoid volvulus (HCC) Hypokalemia Hypotension RUE swelling -DVT studyneg Transaminitis - stable VDRF - extubated 12/4  POD 7, S/PExploratory laparotomy, sigmoid colectomy, descending colostomy. Dr. Gerrit Friends for Sigmoid Volvuluswith infarction, perforation, and feculent peritonitis  -goodostomy output,will have dietitian adv TFs per protocol at this point;  however, will defer need for swallow eval to CCM, but can have CLD from our standpoint -Will need TRH if coming out of unit as per CCM, we will follow  - qshift wet-to-dry dressing changes to midline wound  - Protonix twice daily  -once extubated pulm toilet and IS  -mobilize as able once extubated as well, will need PT/OT  FEN:TF, protonix  VTE: SCD's, lovenox, ok coumadin from surgery standpoint GQ:QPYPP 11/27-12/05,afebrile, WBC6.2 Foley:no Follow up:Dr.Gerkin    LOS: 8 days   Marin Olp, M.D. [facility] Use [email or web address] to contact on call provider

## 2019-10-27 NOTE — Progress Notes (Signed)
NAME:  [name] [name], MRN:  [id number], DOB:  Jun 03, 1932, LOS: 8 ADMISSION DATE:  10/19/2019, CONSULTATION DATE:  10/20/2019 REFERRING MD:  Loann Quill MD, CHIEF COMPLAINT: Acute abdomen, shock  Brief History   85 year old admitted with abdominal pain, sigmoid volvulus.  Seen by surgery and GI Underwent sigmoidoscopy on 11/27.  On 11/28 become increasingly unstable with elevated lactic acid and shock.  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 Failed decompression and went to OR for ex-lap, sigmoid colectomy and colostomy creation. Returned to ICU vented 11/29 - Weaning vent settings 12/2 off pressors for > 24 hours. Weaning but not strong enough or awake enough to come off vent. Advancing tube feeds. IV diuresis for third spacing. Still over 13 liters positive. 12/3: Still too weak to extubate 12/4: Extubated  Consults:  Surgery, GI, PCCM  Procedures:  11/28 - sigmoid colectomy with colostomy.   Significant Diagnostic Tests:  CT abdomen pelvis 10/19/2019-sigmoid volvulus with marked distention, bibasal lung opacities with small left effusion. CXR 11/28 - bibasilar infiltrates with small right pleural effusion.  Micro Data:  Blood cultures 11/27-negative  Antimicrobials:  Vanco, cefepime 11/27 Zosyn 11/27 >> 12/4  Interim history/subjective:  Extubated yesterday 12/4 A bit stronger today No problems reported overnight, tube feeding was restarted via his NG tube  Objective   Blood pressure 121/81, pulse 76, temperature 97.9 F (36.6 C), temperature source Axillary, resp. rate 20, height 5' 8"  (1.727 m), weight 75.5 kg, SpO2  100 %.        Intake/Output Summary (Last 24 hours) at 10/27/2019 0800 Last data filed at 10/27/2019 0600 Gross per 24 hour  Intake 44.14 ml  Output 4500 ml  Net -4455.86 ml   Filed Weights   10/25/19 0500 10/26/19 0438 10/27/19 0500  Weight: 82.5 kg 78.5 kg 75.5 kg   Physical Exam: General: Elderly man, lying comfortably in bed, no distress HEENT NG tube in place, oropharynx moist, no lesions, pupils equal Pulmonary: Clear bilaterally, no accessory muscle use Cardiac: Irregularly irregular, 3/6 systolic murmur Abdomen: Soft, ostomy functioning, positive bowel sounds, tolerating TF GU: Clear yellow urine  Neuro: More awake, answering questions, interacting, follows commands, no tremor  Resolved Hospital Problem list   Sigmoid volvulus.  Septic shock Nonanion gap metabolic acidosis resolved, this was from hyperchloremia Hypernatremia Assessment & Plan:    Sigmoid volvulus with infarction, perforation and peritonitis s/p sigmoid colectomy and colostomy creation on 11/28 Plan Appreciate surgery assistance, continue wound care Tube feeding as ordered Antibiotics completed 12/4  acute hypoxemic respiratory failure mechanical ventilation status post above Plan Continue pulmonary hygiene, initiate mobilization, PT Aspiration, reflux precautions Continue steady diuresis as he can tolerate for his pleural effusions.  May be a candidate for thoracentesis depending on resolution  AF  -No longer in rapid ventricular response, had required amiodarone he is now off Plan Telemetry monitoring Schedule metoprolol ordered per tube Coumadin restarted per tube  Parkinson's disease.  Possible element of baseline dementia Plan Symmetrel, carbidopa/levodopa, Aricept Based on conversations with patient's spouse he does not tolerate benzodiazepines well.  Clonazepam stopped.  Avoid benzos Delirium prevention interventions Mobilization  Fluid and electrolyte imbalance: Total body volume  overload and hypokalemia  Good diuresis over last  several days, remains 4.8 L positive for total hospitalization plan Continue Lasix 40 mg every 12 hours for another day, likely decrease 12/5.  Dose based on renal function and blood pressure.  He is approaching euvolemia Replace potassium again 12/5 Continue free water as ordered   Rising LFTs, alk phos and bilirubin -Question mild cholestasis, stable Plan Follow LFT, bilirubin intermittently.  Stable at this time  Mild thrombocytopenia Plan Continue to follow CBC  Severe protein calorie malnutrition Plan Tube feeding running SLP evaluation and initiate oral feeds when he can tolerate  Severe physical deconditioning Plan Out of bed with assistance, physical therapy Question whether he benefit from short-term LTAC for rehabilitation from this illness  Daily Goals Checklist  Pain/Anxiety/Delirium protocol (if indicated): Avoiding sedating medications VAP protocol (if indicated): Completed DVT prophylaxis: Enoxaparin, beginning Coumadin Nutritional status and feeding goals: Tube feeding at goal GI prophylaxis: protonix Central lines: L [place] TLC, arterial line removed. Glucose control: No coverage currently Code Status: full Family Communication: Wife updated by Jerrye Bushy 2/4 Disposition: to SDU status 12/5   Baltazar Apo, MD, PhD 10/27/2019, 8:17 AM [facility] [phone] or if no answer [phone]

## 2019-10-27 NOTE — Progress Notes (Signed)
[facility] Progress Note Patient Name: [name] [name] DOB: [birth year] MRN: [id number]   Date of Service  10/27/2019  HPI/Events of Note  Patient failed sallow evaluation. NG tube in place  eICU Interventions  Resume tube feeding as recommended by nutrition     Intervention Category Intermediate Interventions: Other:  [name] 10/27/2019, 12:13 AM

## 2019-10-27 NOTE — Evaluation (Signed)
Clinical/Bedside Swallow Evaluation Patient Details  Name: [name] [name] MRN: [id number] Date of Birth: 1932-11-12  Today's Date: 10/27/2019 Time: SLP Start Time (ACUTE ONLY): 1151 SLP Stop Time (ACUTE ONLY): 1219 SLP Time Calculation (min) (ACUTE ONLY): 28 min  Past Medical History:  Past Medical History:  Diagnosis Date  . Dyslipidemia   . Parkinson's disease (HCC)   . S/P mitral valve replacement    mitral regurg, endocarditis  . Stroke [facility])    Past Surgical History:  Past Surgical History:  Procedure Laterality Date  . BOWEL DECOMPRESSION N/A 10/19/2019   Procedure: BOWEL DECOMPRESSION;  Surgeon: Vida Rigger, MD;  Location: [facility] ENDOSCOPY;  Service: Endoscopy;  Laterality: N/A;  . CATARACT EXTRACTION  2009, 2012   right 2009, left 2012  . COLECTOMY WITH COLOSTOMY CREATION/HARTMANN PROCEDURE N/A 10/20/2019   Procedure: sigmoid COLECTOMY WITH COLOSTOMY CREATION/;  Surgeon: Darnell Level, MD;  Location: [facility] ORS;  Service: General;  Laterality: N/A;  . FLEXIBLE SIGMOIDOSCOPY N/A 10/19/2019   Procedure: Arnell Sieving;  Surgeon: Vida Rigger, MD;  Location: [facility] ENDOSCOPY;  Service: Endoscopy;  Laterality: N/A;  . INTRAMEDULLARY (IM) NAIL INTERTROCHANTERIC Left 09/14/2019   Procedure: INTRAMEDULLARY (IM) NAIL INTERTROCHANTRIC;  Surgeon: Roby Lofts, MD;  Location: [facility] OR;  Service: Orthopedics;  Laterality: Left;  . LOOP RECORDER IMPLANT Left 03/13/2015   Procedure: LOOP RECORDER IMPLANT;  Surgeon: Marinus Maw, MD;  Location: [facility] CATH LAB;  Service: Cardiovascular;  Laterality: Left;  . MITRAL VALVE ANNULOPLASTY  04/10/2007    13mm Edwards ring; r/t h/o MR and flail mitral leaflets due to endocarditis: MRI safe  . TEE WITHOUT CARDIOVERSION N/A 03/13/2015   Procedure: TRANSESOPHAGEAL ECHOCARDIOGRAM (TEE);  Surgeon: Laurey Morale, MD;  Location: [facility] ENDOSCOPY;  Service: Cardiovascular;  Laterality: N/A;  . TRANSTHORACIC ECHOCARDIOGRAM  08/15/2012   EF=>55%; normal LV systolic  function; RV mildly dilated & systolic function mildly reduced; RA mod dilated; mild -mod MR & mildy increased gradients; mild TR; AV mildly sclerotic with mild-mod regurg   HPI:  85 y.o. male with medical history significant of Parkinson's disease, mitral valve repair with history of endocarditis and mitral valve angioplasty in 2008, paroxysmal atrial fibrillation on Coumadin (previously on Eliquis but had inflammatory reaction), history of TIA, hyperlipidemia  was brought into the ED with complaint of abdominal pain.  History provided by wife who is at the bedside since patient is completely lethargic.  According to wife, he started having abdominal pain around 3 in the morning.  Pt intubated from 10/19/19-10/26/19; wife reports coughing with thin liquids (water specifically) intermittently over last 4-5 months; BSE generated to assess swallow function; pt has NG tube currently.   Assessment / Plan / Recommendation Clinical Impression  Pt with decreased mentation/confusion and intermittently lethargic during BSE with hx of Parkinson's and overt s/s of aspiration (coughing with thin water intermittently) prior to intubation/hospitalization per wife.  A MBS was completed 5 years prior without difficulty noted, per wife.  Overall decreased oral weakness/awareness of bolus within oral cavity and decreased propulsion of bolus to pharynx; delayed cough and multiple swallows noted with thin; ice chips without overt s/s of aspiration, but pt is holding prior to swallow; pt expelled a portion of material/boluses from oral cavity throughout assessment with oral suction required; recommend continue NPO status at this time due to lengthy intubation paired with pt's decreased mentation and overt s/s of aspiration noted during BSE. Pt would likely not consume enough to maintain adequate nutrition/hydration with current cognitive status; objective testing  recommended when pt is able/mentation improved; ST will f/u for diet  recommendation/upgrade and education re: aspiration risk/beneficial swallowing strategies; thank you for this consult. SLP Visit Diagnosis: Dysphagia, oropharyngeal phase (R13.12)    Aspiration Risk  Moderate aspiration risk;Risk for inadequate nutrition/hydration    Diet Recommendation   NPO  Medication Administration: Via alternative means    Other  Recommendations Oral Care Recommendations: Oral care QID   Follow up Recommendations 24 hour supervision/assistance;Other (comment)(or )      Frequency and Duration min 3x week  1 week       Prognosis Prognosis for Safe Diet Advancement: Fair Barriers to Reach Goals: Cognitive deficits      Swallow Study   General Date of Onset: 10/19/19 HPI: 85 y.o. male with medical history significant of Parkinson's disease, mitral valve repair with history of endocarditis and mitral valve angioplasty in 2008, paroxysmal atrial fibrillation on Coumadin (previously on Eliquis but had inflammatory reaction), history of TIA, hyperlipidemia  was brought into the ED with complaint of abdominal pain.  History provided by wife who is at the bedside since patient is completely lethargic.  According to wife, he started having abdominal pain around 3 in the morning.  No further information about abdominal pain.  According to her, his last bowel movement was sometime yesterday but small amount of BM he passed this morning.  No history of nausea, vomiting, fever, chills, chest pain, shortness of breath or any other complaint. Type of Study: Bedside Swallow Evaluation Previous Swallow Assessment: (wife reported MBS completed at [facility] 5 yrs ago) Diet Prior to this Study: NPO Temperature Spikes Noted: No Respiratory Status: Room air History of Recent Intubation: Yes Length of Intubations (days): (7) Date extubated: 10/26/19 Behavior/Cognition: Confused;Lethargic/Drowsy;Requires cueing Oral Cavity Assessment: Dry Oral Care Completed by SLP: Yes Oral Cavity  - Dentition: Adequate natural dentition Self-Feeding Abilities: Needs assist Patient Positioning: Upright in chair Baseline Vocal Quality: Low vocal intensity;Other (comment)(hx of Parkinson's) Volitional Cough: Weak Volitional Swallow: Unable to elicit    Oral/Motor/Sensory Function Overall Oral Motor/Sensory Function: Generalized oral weakness Facial ROM: Within Functional Limits Facial Symmetry: Within Functional Limits Facial Strength: Reduced right;Reduced left Lingual ROM: Reduced right;Reduced left Lingual Symmetry: Within Functional Limits Lingual Strength: Reduced   Ice Chips Ice chips: Impaired Presentation: Spoon Oral Phase Impairments: Poor awareness of bolus;Reduced lingual movement/coordination Oral Phase Functional Implications: Oral holding;Prolonged oral transit Pharyngeal Phase Impairments: Other (comments);Suspected delayed Swallow(expelled portion from oral cavity)   Thin Liquid Thin Liquid: Impaired Presentation: Spoon Oral Phase Impairments: Reduced lingual movement/coordination Oral Phase Functional Implications: Oral holding;Prolonged oral transit;Other (comment)(expelled from oral cavity partially) Pharyngeal  Phase Impairments: Suspected delayed Swallow;Multiple swallows;Cough - Delayed    Nectar Thick Nectar Thick Liquid: Not tested   Honey Thick Honey Thick Liquid: Not tested   Puree Puree: Impaired Presentation: Spoon Oral Phase Impairments: Poor awareness of bolus;Reduced lingual movement/coordination Oral Phase Functional Implications: Oral holding;Prolonged oral transit;Other (comment)(expelled from oral cavity prior to swallow) Pharyngeal Phase Impairments: Suspected delayed Swallow   Solid     Solid: Not tested      Elvina Sidle, M.S., CCC-SLP 10/27/2019,1:22 PM

## 2019-10-27 NOTE — Progress Notes (Signed)
[facility] for Warfarin Indication: MV angioplasty  Allergies  Allergen Reactions  . Apixaban Palpitations, Rash and Other (See Comments)    Worsening tardive dyskinesia and rapid heart rate  . Ace Inhibitors Cough  . Demerol [Meperidine] Other (See Comments)    Parkinsons disease  . Poison Ivy Extract Itching, Swelling and Rash   Patient Measurements: Height: 5' 8"  (172.7 cm) Weight: 166 lb 7.2 oz (75.5 kg) IBW/kg (Calculated) : 68.4  Vital Signs: Temp: 97.7 F (36.5 C) (12/05 0800) Temp Source: Axillary (12/05 0800) BP: 125/73 (12/05 1000) Pulse Rate: 83 (12/05 0929)  Labs: Recent Labs    10/25/19 0552 10/26/19 0326 10/26/19 1844 10/27/19 0325  HGB 10.3* 11.3*  --  11.4*  HCT 32.4* 34.7*  --  36.3*  PLT 91* 112*  --  138*  LABPROT  --   --  15.9* 16.3*  INR  --   --  1.3* 1.3*  CREATININE 1.11 1.01  --   --    Estimated Creatinine Clearance: 51.7 mL/min (by C-G formula based on SCr of 1.01 mg/dL).  Medical History: Past Medical History:  Diagnosis Date  . Dyslipidemia   . Parkinson's disease ([place])   . S/P mitral valve replacement    mitral regurg, endocarditis  . Stroke [facility])    Medications:  Scheduled:  . amantadine  100 mg Per Tube 2 times per day  . Carbidopa-Levodopa ER  1 capsule Per NG tube QHS  . Carbidopa-Levodopa ER  2 capsule Per NG tube Daily  . Carbidopa-Levodopa ER  3 capsule Per NG tube 2 times per day  . chlorhexidine gluconate (MEDLINE KIT)  15 mL Mouth Rinse BID  . Chlorhexidine Gluconate Cloth  6 each Topical Daily  . donepezil  10 mg Per Tube QHS  . enoxaparin (LOVENOX) injection  40 mg Subcutaneous Q24H  . escitalopram  10 mg Per Tube Daily  . free water  200 mL Per Tube Q6H  . furosemide  40 mg Intravenous Q12H  . mouth rinse  15 mL Mouth Rinse 10 times per day  . metoprolol tartrate  12.5 mg Per Tube BID  . pantoprazole (PROTONIX) IV  40 mg Intravenous Q12H  . potassium chloride  40 mEq Per  Tube Q4H  . rotigotine  1 patch Transdermal Daily  . sodium chloride flush  10-40 mL Intracatheter Q12H   Assessment:  22 yoM with sigmoid volvulus perforation > feculent peritonitis, 11/27 sigmoidoscopy, 11/28 colectomy with colostomy.  Intubated 11/28 >> 12/4 extubated, plan resume Warfarin once passes swallow study. INR goal 2-3 per Coumadin clinic notes. PTA Warfarin 2.52m daily, LD 11/26   Today, 10/27/2019 Did not pass swallow study, but tolerating meds via tube. INR 1.3, H/H stable, Plt improved 138  Goal of Therapy:  INR 2-3 Monitor platelets by anticoagulation protocol: Yes   Plan:  Warfarin 527mvia tube x1 today at 1800 Continuing Lovenox 406maily Daily Protime/INR   GreMinda DittoarmD 10/27/2019,12:34 PM

## 2019-10-28 DIAGNOSIS — J9601 Acute respiratory failure with hypoxia: Secondary | ICD-10-CM | POA: Diagnosis not present

## 2019-10-28 DIAGNOSIS — K562 Volvulus: Secondary | ICD-10-CM | POA: Diagnosis not present

## 2019-10-28 LAB — BASIC METABOLIC PANEL
Anion gap: 10 (ref 5–15)
BUN: 27 mg/dL — ABNORMAL HIGH (ref 8–23)
CO2: 27 mmol/L (ref 22–32)
Calcium: 7.9 mg/dL — ABNORMAL LOW (ref 8.9–10.3)
Chloride: 105 mmol/L (ref 98–111)
Creatinine, Ser: 0.89 mg/dL (ref 0.61–1.24)
GFR calc Af Amer: >60 mL/min (ref >60)
GFR calc non Af Amer: >60 mL/min (ref >60)
Glucose, Bld: 118 mg/dL — ABNORMAL HIGH (ref 70–99)
Potassium: 3.3 mmol/L — ABNORMAL LOW (ref 3.5–5.1)
Sodium: 142 mmol/L (ref 135–145)

## 2019-10-28 LAB — CBC WITH DIFFERENTIAL/PLATELET
Abs Immature Granulocytes: 0.14 10*3/uL — ABNORMAL HIGH (ref 0.00–0.07)
Basophils Absolute: 0 10*3/uL (ref 0.0–0.1)
Basophils Relative: 0 %
Eosinophils Absolute: 0.1 10*3/uL (ref 0.0–0.5)
Eosinophils Relative: 1 %
HCT: 35.7 % — ABNORMAL LOW (ref 39.0–52.0)
Hemoglobin: 11.4 g/dL — ABNORMAL LOW (ref 13.0–17.0)
Immature Granulocytes: 2 %
Lymphocytes Relative: 9 %
Lymphs Abs: 0.8 10*3/uL (ref 0.7–4.0)
MCH: 31.7 pg (ref 26.0–34.0)
MCHC: 31.9 g/dL (ref 30.0–36.0)
MCV: 99.2 fL (ref 80.0–100.0)
Monocytes Absolute: 0.5 10*3/uL (ref 0.1–1.0)
Monocytes Relative: 5 %
Neutro Abs: 7.7 10*3/uL (ref 1.7–7.7)
Neutrophils Relative %: 83 %
Platelets: 150 10*3/uL (ref 150–400)
RBC: 3.6 MIL/uL — ABNORMAL LOW (ref 4.22–5.81)
RDW: 16.8 % — ABNORMAL HIGH (ref 11.5–15.5)
WBC: 9.2 10*3/uL (ref 4.0–10.5)
nRBC: 0 % (ref 0.0–0.2)

## 2019-10-28 LAB — PROTIME-INR
INR: 1.3 — ABNORMAL HIGH (ref 0.8–1.2)
Prothrombin Time: 15.8 seconds — ABNORMAL HIGH (ref 11.4–15.2)

## 2019-10-28 LAB — GLUCOSE, CAPILLARY
Glucose-Capillary: 100 mg/dL — ABNORMAL HIGH (ref 70–99)
Glucose-Capillary: 103 mg/dL — ABNORMAL HIGH (ref 70–99)
Glucose-Capillary: 119 mg/dL — ABNORMAL HIGH (ref 70–99)
Glucose-Capillary: 128 mg/dL — ABNORMAL HIGH (ref 70–99)
Glucose-Capillary: 87 mg/dL (ref 70–99)
Glucose-Capillary: 91 mg/dL (ref 70–99)
Glucose-Capillary: 92 mg/dL (ref 70–99)

## 2019-10-28 LAB — MAGNESIUM: Magnesium: 1.9 mg/dL (ref 1.7–2.4)

## 2019-10-28 MED ORDER — POTASSIUM CHLORIDE 20 MEQ/15ML (10%) PO SOLN
40.0000 meq | ORAL | Status: AC
  Administered 2019-10-28 (×2): 40 meq
  Filled 2019-10-28 (×2): qty 30

## 2019-10-28 MED ORDER — POTASSIUM CHLORIDE 20 MEQ/15ML (10%) PO SOLN
20.0000 meq | ORAL | Status: AC
  Administered 2019-10-28 (×2): 20 meq
  Filled 2019-10-28 (×2): qty 15

## 2019-10-28 MED ORDER — WARFARIN SODIUM 5 MG PO TABS
5.0000 mg | ORAL_TABLET | Freq: Once | ORAL | Status: AC
  Administered 2019-10-28: 5 mg
  Filled 2019-10-28: qty 1

## 2019-10-28 NOTE — Progress Notes (Signed)
NAME:  [name] [name], MRN:  [id number], DOB:  24-Oct-1932, LOS: 9 ADMISSION DATE:  10/19/2019, CONSULTATION DATE:  10/20/2019 REFERRING MD:  Loann Quill MD, CHIEF COMPLAINT: Acute abdomen, shock  Brief History   85 year old admitted with abdominal pain, sigmoid volvulus.  Seen by surgery and GI Underwent sigmoidoscopy on 11/27.  On 11/28 become increasingly unstable with elevated lactic acid and shock.  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 Failed decompression and went to OR for ex-lap, sigmoid colectomy and colostomy creation. Returned to ICU vented 11/29 - Weaning vent settings 12/2 off pressors for > 24 hours. Weaning but not strong enough or awake enough to come off vent. Advancing tube feeds. IV diuresis for third spacing. Still over 13 liters positive. 12/3: Still too weak to extubate 12/4: Extubated  Consults:  Surgery, GI, PCCM  Procedures:  11/28 - sigmoid colectomy with colostomy.   Significant Diagnostic Tests:  CT abdomen pelvis 10/19/2019-sigmoid volvulus with marked distention, bibasal lung opacities with small left effusion. CXR 11/28 - bibasilar infiltrates with small right pleural effusion.  Micro Data:  Blood cultures 11/27-negative  Antimicrobials:  Vanco, cefepime 11/27 Zosyn 11/27 >> 12/4  Interim history/subjective:  Was able to get up out of bed 12/5 with PT SLP recommended remain n.p.o., continuing to evaluate  Objective   Blood pressure (!) 78/54, pulse 80, temperature 97.8 F (36.6 C), temperature source Axillary, resp. rate 17, height 5' 8" (1.727 m), weight 74.3 kg, SpO2 96 %. CVP:  [2 mmHg-8  mmHg] 8 mmHg      Intake/Output Summary (Last 24 hours) at 10/28/2019 0735 Last data filed at 10/28/2019 2330 Gross per 24 hour  Intake 280 ml  Output 1775 ml  Net -1495 ml   Filed Weights   10/26/19 0438 10/27/19 0500 10/28/19 0500  Weight: 78.5 kg 75.5 kg 74.3 kg   Physical Exam: General: Elderly man, in bed, no distress HEENT NG tube in place, OP moist, no icterus Pulmonary: Clear, no accessory muscle use Cardiac: irregular, 70's, 3/6 M Abdomen: Soft, ostomy functioning, stool in bag, positive bowel sounds GU: Foley catheter Neuro: A bit more sleepy today, weak voice but does answer questions and interact.  Globally weak but moves extremities  Resolved Hospital Problem list   Sigmoid volvulus.  Septic shock Nonanion gap metabolic acidosis resolved, this was from hyperchloremia Hypernatremia Assessment & Plan:    Sigmoid volvulus with infarction, perforation and peritonitis s/p sigmoid colectomy and colostomy creation on 11/28 Plan Appreciate surgery management of wound, postop Tube feeding per NG tube.  He did not pass swallow evaluation 12/6, appreciate SLP evaluation.  He uses thickened liquids at home even at baseline Antibiotics completed 12/4  acute hypoxemic respiratory failure mechanical ventilation status post above Plan Mobilize, PT, pulmonary hygiene Aspiration and reflux precautions.  N.p.o. for now as above Dose diuretics daily as he can tolerate, follow chest x-ray intermittently for pleural effusions  AF  -No longer in rapid ventricular response, had required amiodarone he is now off Plan Amatory monitoring Scheduled metoprolol ordered per tube Coumadin per tube  Parkinson's disease.  Possible element of baseline dementia Plan Continue Symmetrel, carbidopa/levodopa, Aricept Spouse indicates that he does not tolerate benzodiazepines well.  Clonazepam  stopped, avoiding short acting benzos as able Delirium prevention interventions Mobilization   Fluid and electrolyte imbalance: Total body volume overload and hypokalemia  Good diuresis over last several days, remains 3.4 L positive for total hospitalization plan Lasix held 12/5 PM, had some relative hypotension.  Consider restart or dose daily 12/7 depending on overall volume status Replace potassium again 12/6 Free water as ordered  Rising LFTs, alk phos and bilirubin -Question mild cholestasis, stable Plan Follow LFT, bilirubin intermittently.  Stable at this time  Mild thrombocytopenia Plan Follow CBG  Severe protein calorie malnutrition Plan Tube feeding running Appreciate SLP evaluation.  He uses thickened liquids at baseline-  Severe physical deconditioning Plan Physical therapy, out of bed with assistance He will need rehab of some sort.  Question whether he would benefit from short-term LTAC care.  Will discuss with case management 12/7  Daily Goals Checklist  Pain/Anxiety/Delirium protocol (if indicated): Avoiding sedating medications VAP protocol (if indicated): Completed DVT prophylaxis: Enoxaparin, beginning Coumadin Nutritional status and feeding goals: Tube feeding at goal GI prophylaxis: protonix Central lines: L [place] TLC, arterial line removed. Glucose control: No coverage currently Code Status: full Family Communication: Wife updated at bedside 12/5 Disposition: SDU status   Baltazar Apo, MD, PhD 10/28/2019, 7:35 AM [facility] [phone] or if no answer [phone]

## 2019-10-28 NOTE — Progress Notes (Signed)
[place] [facility] for Warfarin Indication: MV angioplasty  Allergies  Allergen Reactions  . Apixaban Palpitations, Rash and Other (See Comments)    Worsening tardive dyskinesia and rapid heart rate  . Ace Inhibitors Cough  . Demerol [Meperidine] Other (See Comments)    Parkinsons disease  . Poison Ivy Extract Itching, Swelling and Rash   Patient Measurements: Height: 5' 8"  (172.7 cm) Weight: 163 lb 12.8 oz (74.3 kg) IBW/kg (Calculated) : 68.4  Vital Signs: Temp: 98.9 F (37.2 C) (12/06 0800) Temp Source: Axillary (12/06 0358) BP: 98/60 (12/06 0800) Pulse Rate: 76 (12/06 0800)  Labs: Recent Labs    10/26/19 0326 10/26/19 1844 10/27/19 0325 10/28/19 0400  HGB 11.3*  --  11.4* 11.4*  HCT 34.7*  --  36.3* 35.7*  PLT 112*  --  138* 150  LABPROT  --  15.9* 16.3* 15.8*  INR  --  1.3* 1.3* 1.3*  CREATININE 1.01  --   --  0.89   Estimated Creatinine Clearance: 58.7 mL/min (by C-G formula based on SCr of 0.89 mg/dL).  Medical History: Past Medical History:  Diagnosis Date  . Dyslipidemia   . Parkinson's disease ([place])   . S/P mitral valve replacement    mitral regurg, endocarditis  . Stroke [facility])    Medications:  Scheduled:  . amantadine  100 mg Per Tube 2 times per day  . Carbidopa-Levodopa ER  1 capsule Per NG tube QHS  . Carbidopa-Levodopa ER  2 capsule Per NG tube Daily  . Carbidopa-Levodopa ER  3 capsule Per NG tube 2 times per day  . chlorhexidine gluconate (MEDLINE KIT)  15 mL Mouth Rinse BID  . Chlorhexidine Gluconate Cloth  6 each Topical Daily  . donepezil  10 mg Per Tube QHS  . enoxaparin (LOVENOX) injection  40 mg Subcutaneous Q24H  . escitalopram  10 mg Per Tube Daily  . free water  200 mL Per Tube Q6H  . mouth rinse  15 mL Mouth Rinse 10 times per day  . metoprolol tartrate  12.5 mg Per Tube BID  . pantoprazole (PROTONIX) IV  40 mg Intravenous Q12H  . potassium chloride  20 mEq Per Tube Q4H  . potassium chloride  40  mEq Per Tube Q4H  . rotigotine  1 patch Transdermal Daily  . sodium chloride flush  10-40 mL Intracatheter Q12H  . Warfarin - Pharmacist Dosing Inpatient   Does not apply q1800   Assessment:  82 yoM with sigmoid volvulus perforation > feculent peritonitis, 11/27 sigmoidoscopy, 11/28 colectomy with colostomy.  Intubated 11/28 >> 12/4 extubated, plan resume Warfarin once passes swallow study. INR goal 2-3 per Coumadin clinic notes. PTA Warfarin 2.58m daily, LD 11/26   Today, 10/28/2019 Did not pass swallow study, but tolerating meds via tube. INR remains 1.3, H/H stable, Plt improved 150  Goal of Therapy:  INR 2-3 Monitor platelets by anticoagulation protocol: Yes   Plan:  Warfarin 563mvia tube x1 today at 1800 Continuing Lovenox 4092maily Daily Protime/INR   [name] DittoarmD 10/28/2019,12:20 PM

## 2019-10-28 NOTE — Progress Notes (Signed)
[facility] ADULT ICU REPLACEMENT PROTOCOL FOR AM LAB REPLACEMENT ONLY  The patient does apply for the [facility] Adult ICU Electrolyte Replacment Protocol based on the criteria listed below:   1. Is GFR >/= 40 ml/min? Yes.    Patient's GFR today is >60 2. Is urine output >/= 0.5 ml/kg/hr for the last 6 hours? Yes.   Patient's UOP is 0.95 ml/kg/hr 3. Is BUN < 60 mg/dL? Yes.    Patient's BUN today is 27  4. Abnormal electrolyte(s): k 3.3 5. Ordered repletion with: protocol 6. If a panic level lab has been reported, has the CCM MD in charge been notified? No..   Physician:    Ronda Fairly A 10/28/2019 6:38 AM

## 2019-10-28 NOTE — Progress Notes (Signed)
Patient ID: [name] [name], male   DOB: 08-30-1932, 85 y.o.   MRN: [id number]    8 Days Post-Op  Subjective: Conversant - extubated Friday.  Tol TFs via NG tube - seen by Speech yesterday; not cleared for PO at this point. Noted long term issues prior to this as well.  Objective: Vital signs in last 24 hours: Temp:  [97.4 F (36.3 C)-99 F (37.2 C)] 97.8 F (36.6 C) (12/06 0358) Pulse Rate:  [71-97] 80 (12/06 0600) Resp:  [14-30] 17 (12/06 0600) BP: (74-148)/(52-92) 78/54 (12/06 0600) SpO2:  [89 %-100 %] 96 % (12/06 0600) Weight:  [74.3 kg] 74.3 kg (12/06 0500) Last BM Date: 10/27/19  Intake/Output from previous day: 12/05 0701 - 12/06 0700 In: 280 [NG/GT:210] Out: 1775 [Urine:1125; Stool:650] Intake/Output this shift: No intake/output data recorded.  PE: Heart: regular Lungs: CTAB Abd: soft, nontender, nondistended; midline incisions is clean and packed, fascia intact, colostomy with liquid stool present and flatus.  Stoma is pink and viable  Lab Results:  Recent Labs    10/27/19 0325 10/28/19 0400  WBC 8.7 9.2  HGB 11.4* 11.4*  HCT 36.3* 35.7*  PLT 138* 150   BMET Recent Labs    10/26/19 0326 10/28/19 0400  NA 143 142  K 3.2* 3.3*  CL 109 105  CO2 23 27  GLUCOSE 97 118*  BUN 35* 27*  CREATININE 1.01 0.89  CALCIUM 7.8* 7.9*   PT/INR Recent Labs    10/27/19 0325 10/28/19 0400  LABPROT 16.3* 15.8*  INR 1.3* 1.3*   CMP     Component Value Date/Time   NA 142 10/28/2019 0400   NA 143 09/10/2019 1042   K 3.3 (L) 10/28/2019 0400   CL 105 10/28/2019 0400   CO2 27 10/28/2019 0400   GLUCOSE 118 (H) 10/28/2019 0400   BUN 27 (H) 10/28/2019 0400   BUN 26 09/10/2019 1042   CREATININE 0.89 10/28/2019 0400   CREATININE 1.16 (H) 12/25/2015 1505   CALCIUM 7.9 (L) 10/28/2019 0400   PROT 4.9 (L) 10/26/2019 0326   ALBUMIN 2.1 (L) 10/26/2019 0326   AST 46 (H) 10/26/2019 0326   ALT 9 10/26/2019 0326   ALKPHOS 165 (H) 10/26/2019 0326   BILITOT 2.4 (H) 10/26/2019  0326   GFRNONAA >60 10/28/2019 0400   GFRAA >60 10/28/2019 0400   Lipase     Component Value Date/Time   LIPASE 20 10/19/2019 1400       Studies/Results: Dg Chest Port 1 View  Result Date: 10/27/2019 CLINICAL DATA:  Acute respiratory failure with hypoxia EXAM: PORTABLE CHEST 1 VIEW COMPARISON:  Radiograph 10/26/2019 FINDINGS: Interval extubation. NG tube remains in stomach. Sternotomy wires overlie stable cardiac silhouette. There bilateral pleural effusions unchanged. No pulmonary edema. IMPRESSION: 1. Interval extubation without complication. 2. No change in bilateral pleural effusions. Electronically Signed   By: Suzy Bouchard M.D.   On: 10/27/2019 07:28   Dg Chest Port 1 View  Result Date: 10/26/2019 CLINICAL DATA:  Pleural effusion shock, recent sigmoid volvulus and sigmoidoscopy EXAM: PORTABLE CHEST 1 VIEW COMPARISON:  Chest radiograph from one day prior. FINDINGS: Endotracheal tube tip is 4.7 cm above the carina. Enteric tube enters stomach with the tip not seen on this image. Left subclavian central venous catheter terminates in the left brachiocephalic vein just to the right of midline. Intact sternotomy wires. Loop recorder overlies the left chest. Mitral annuloplasty ring in place. Stable cardiomediastinal silhouette with mild cardiomegaly. No pneumothorax. Small bilateral pleural effusions, right greater than  left, stable with stable bibasilar atelectasis. No overt pulmonary edema. IMPRESSION: 1. Well-positioned support structures.  No pneumothorax. 2. Small bilateral pleural effusions with bibasilar atelectasis, right greater than left, stable. 3. Stable cardiomegaly without overt pulmonary edema. Electronically Signed   By: Delbert Phenix M.D.   On: 10/26/2019 09:26    Anti-infectives: Anti-infectives (From admission, onward)   Start     Dose/Rate Route Frequency Ordered Stop   10/19/19 2200  piperacillin-tazobactam (ZOSYN) IVPB 3.375 g  Status:  Discontinued     3.375 g  12.5 mL/hr over 240 Minutes Intravenous Every 8 hours 10/19/19 1802 10/27/19 0159   10/19/19 1700  vancomycin (VANCOCIN) 1,500 mg in sodium chloride 0.9 % 500 mL IVPB     1,500 mg 250 mL/hr over 120 Minutes Intravenous  Once 10/19/19 1636 10/19/19 1958   10/19/19 1630  ceFEPIme (MAXIPIME) 2 g in sodium chloride 0.9 % 100 mL IVPB     2 g 200 mL/hr over 30 Minutes Intravenous  Once 10/19/19 1629 10/19/19 1911       Assessment/Plan Parkinson's disease (HCC) Displaced intertrochanteric fracture of left femur, initial encounter for closed fracture (HCC) Chronic systolic CHF (congestive heart failure) (HCC) Dementia without behavioral disturbance (HCC) Sigmoid volvulus (HCC) Hypokalemia Hypotension RUE swelling -DVT studyneg Transaminitis - stable VDRF - extubated 12/4  POD 8, S/PExploratory laparotomy, sigmoid colectomy, descending colostomy. Dr. Gerrit Friends for Sigmoid Volvuluswith infarction, perforation, and feculent peritonitis  -goodostomy output,TFs per protocol at this point; however, will defer need for swallow eval to CCM, but can have CLD from our standpoint -Will need TRH if coming out of unit as per CCM, we will follow  - qshift wet-to-dry dressing changes to midline wound  - Protonix twice daily  -pulm toilet and IS  -mobilize as able; PT/OT; anticipate will need SNF.  FEN:TF, protonix  VTE: SCD's, lovenox, warfarin has been restarted NL:GXQJJ 11/27-12/05,afebrile, WBCremains normal; exam benign and stoma functional; tolerating tube feeds Foley:no Follow up:Dr.Gerkin    LOS: 9 days   Marin Olp, M.D. [facility] Use [email or web address] to contact on call provider

## 2019-10-29 ENCOUNTER — Inpatient Hospital Stay (HOSPITAL_COMMUNITY): Payer: Medicare Other

## 2019-10-29 DIAGNOSIS — R400 Somnolence: Secondary | ICD-10-CM | POA: Diagnosis not present

## 2019-10-29 DIAGNOSIS — G20 Parkinson's disease: Secondary | ICD-10-CM | POA: Diagnosis not present

## 2019-10-29 LAB — CBC WITH DIFFERENTIAL/PLATELET
Abs Immature Granulocytes: 0.16 10*3/uL — ABNORMAL HIGH (ref 0.00–0.07)
Basophils Absolute: 0 10*3/uL (ref 0.0–0.1)
Basophils Relative: 0 %
Eosinophils Absolute: 0.1 10*3/uL (ref 0.0–0.5)
Eosinophils Relative: 1 %
HCT: 35.9 % — ABNORMAL LOW (ref 39.0–52.0)
Hemoglobin: 11.4 g/dL — ABNORMAL LOW (ref 13.0–17.0)
Immature Granulocytes: 2 %
Lymphocytes Relative: 8 %
Lymphs Abs: 0.8 10*3/uL (ref 0.7–4.0)
MCH: 31.7 pg (ref 26.0–34.0)
MCHC: 31.8 g/dL (ref 30.0–36.0)
MCV: 99.7 fL (ref 80.0–100.0)
Monocytes Absolute: 0.4 10*3/uL (ref 0.1–1.0)
Monocytes Relative: 4 %
Neutro Abs: 8.4 10*3/uL — ABNORMAL HIGH (ref 1.7–7.7)
Neutrophils Relative %: 85 %
Platelets: 183 10*3/uL (ref 150–400)
RBC: 3.6 MIL/uL — ABNORMAL LOW (ref 4.22–5.81)
RDW: 16.7 % — ABNORMAL HIGH (ref 11.5–15.5)
WBC: 9.9 10*3/uL (ref 4.0–10.5)
nRBC: 0 % (ref 0.0–0.2)

## 2019-10-29 LAB — GLUCOSE, CAPILLARY
Glucose-Capillary: 90 mg/dL (ref 70–99)
Glucose-Capillary: 102 mg/dL — ABNORMAL HIGH (ref 70–99)
Glucose-Capillary: 102 mg/dL — ABNORMAL HIGH (ref 70–99)
Glucose-Capillary: 104 mg/dL — ABNORMAL HIGH (ref 70–99)
Glucose-Capillary: 103 mg/dL — ABNORMAL HIGH (ref 70–99)
Glucose-Capillary: 114 mg/dL — ABNORMAL HIGH (ref 70–99)

## 2019-10-29 LAB — COMPREHENSIVE METABOLIC PANEL
ALT: 7 U/L (ref 0–44)
AST: 35 U/L (ref 15–41)
Albumin: 2.3 g/dL — ABNORMAL LOW (ref 3.5–5.0)
Alkaline Phosphatase: 120 U/L (ref 38–126)
Anion gap: 8 (ref 5–15)
BUN: 25 mg/dL — ABNORMAL HIGH (ref 8–23)
CO2: 25 mmol/L (ref 22–32)
Calcium: 8 mg/dL — ABNORMAL LOW (ref 8.9–10.3)
Chloride: 108 mmol/L (ref 98–111)
Creatinine, Ser: 0.83 mg/dL (ref 0.61–1.24)
GFR calc Af Amer: >60 mL/min (ref >60)
GFR calc non Af Amer: >60 mL/min (ref >60)
Glucose, Bld: 98 mg/dL (ref 70–99)
Potassium: 3.7 mmol/L (ref 3.5–5.1)
Sodium: 141 mmol/L (ref 135–145)
Total Bilirubin: 2 mg/dL — ABNORMAL HIGH (ref 0.3–1.2)
Total Protein: 5 g/dL — ABNORMAL LOW (ref 6.5–8.1)

## 2019-10-29 LAB — PROTIME-INR
INR: 1.6 — ABNORMAL HIGH (ref 0.8–1.2)
Prothrombin Time: 19 seconds — ABNORMAL HIGH (ref 11.4–15.2)

## 2019-10-29 MED ORDER — FUROSEMIDE 10 MG/ML IJ SOLN
40.0000 mg | Freq: Once | INTRAMUSCULAR | Status: AC
  Administered 2019-10-29: 40 mg via INTRAVENOUS
  Filled 2019-10-29: qty 4

## 2019-10-29 MED ORDER — OSMOLITE 1.5 CAL PO LIQD
1000.0000 mL | ORAL | Status: DC
  Administered 2019-10-29 – 2019-10-30 (×2): 1000 mL
  Filled 2019-10-29 (×4): qty 1000

## 2019-10-29 MED ORDER — PANTOPRAZOLE SODIUM 40 MG IV SOLR
40.0000 mg | Freq: Every day | INTRAVENOUS | Status: DC
  Administered 2019-10-29 – 2019-11-01 (×4): 40 mg via INTRAVENOUS
  Filled 2019-10-29 (×4): qty 40

## 2019-10-29 MED ORDER — FLUCONAZOLE 100MG IVPB
100.0000 mg | Freq: Once | INTRAVENOUS | Status: AC
  Administered 2019-10-29: 100 mg via INTRAVENOUS
  Filled 2019-10-29: qty 50

## 2019-10-29 MED ORDER — POTASSIUM CHLORIDE 20 MEQ/15ML (10%) PO SOLN
40.0000 meq | Freq: Once | ORAL | Status: AC
  Administered 2019-10-29: 40 meq
  Filled 2019-10-29: qty 30

## 2019-10-29 MED ORDER — PRO-STAT SUGAR FREE PO LIQD
30.0000 mL | Freq: Two times a day (BID) | ORAL | Status: DC
  Administered 2019-10-29 – 2019-11-03 (×8): 30 mL via ORAL
  Filled 2019-10-29 (×10): qty 30

## 2019-10-29 MED ORDER — WARFARIN SODIUM 5 MG PO TABS
5.0000 mg | ORAL_TABLET | Freq: Once | ORAL | Status: AC
  Administered 2019-10-29: 5 mg via ORAL
  Filled 2019-10-29: qty 1

## 2019-10-29 NOTE — Progress Notes (Signed)
PCCM Interval Progress Note  Called pt's wife [name] [name] over the phone per her request.  We reviewed [name] [name] hospitalization / events and discussed his current circumstances.  We also discussed patient's prior wishes under circumstances such as this. She has decided not to perform resuscitation if arrest were to occur or be placed back on mechanical ventilation, but to otherwise continue with current medical support / therapies.  Her hope is that he can progress to the point of passing swallow eval and gain enough strength to return home.   She understands that before he can return home, he will likely require LTAC care at least temporarily.  If he does not progress and pass swallow eval, she is unsure whether she would want to pursue PEG placement as she understands that if we reach that point, his chances of meaningful recovery to the point of acceptable quality of life are slim.  Informed RN of plan.  Pt to transfer out of ICU today and TRH to assume care in AM 12/8.   [name], [place] [facility] 10/29/2019, 12:29 PM

## 2019-10-29 NOTE — Progress Notes (Signed)
Nutrition Follow-up  DOCUMENTATION CODES:   Not applicable  INTERVENTION:  - will adjust TF regimen: Osmolite 1.5 @ 20 ml/hr to advance by 10 ml every 8 hours to reach goal rate of 50 ml/hr with 30 ml prostat BID. - at goal rate, this regimen will provide 2000 kcal (96% estimated kcal need), 105 grams protein, and 914 ml free water.  - free water flush to be per Surgery/MD.  - Monitor magnesium, potassium, and phosphorus daily for at least 3 days, MD to replete as needed, as pt is at risk for refeeding syndrome given prolonged period without adequate nutrition.   NUTRITION DIAGNOSIS:   Increased nutrient needs related to acute illness as evidenced by estimated needs. -ongoing  GOAL:   Patient will meet greater than or equal to 90% of their needs -to be met with TF  MONITOR:   TF tolerance, Labs, Weight trends  ASSESSMENT:   85 year old admitted with abdominal pain, sigmoid volvulus.  Seen by surgery and GIUnderwent sigmoidoscopy on 11/27.  On 11/28 become increasingly unstable with elevated lactic acid and shock.  Significant Events: 11/26- intubation 11/27- sigmoidoscopy 11/28- ex lap, sigmoid colectomy, colostomy creation 11/29- NGT placement 12/4- extubation   Patient remains NPO. NGT replaced at bedside by RN with small bore NGT earlier this AM; bed-side abdominal x-ray completed and image in the chart but waiting to be read. Weight is beginning to trend back down. He is receiving Vital AF 1.2 @ 30 ml/hr with 200 ml free water QID. This regimen provides 864 kcal, 54 grams protein, and 1384 ml free water. Communicated with RN about plan to change TF formula.   SLP was unable to work with patient earlier today due to lethargy/fatigue, not alert, and barely phonating today.    Labs reviewed; BUN: 25 mg/dl, Ca: 8 mg/dl. Medications reviewed; 40 mg IV protonix/day, 40 mEq KCl per tube x2 doses 12/6 and x1 dose 12/7. IVF; D5 @ 10 ml/hr (41 kcal).   Diet Order:   Diet Order             Diet NPO time specified  Diet effective now              EDUCATION NEEDS:   No education needs have been identified at this time  Skin:  Skin Assessment: Reviewed RN Assessment  Last BM:  12/7  Height:   Ht Readings from Last 1 Encounters:  10/20/19 5' 8"  (1.727 m)    Weight:   Wt Readings from Last 1 Encounters:  10/29/19 74.4 kg    Ideal Body Weight:  70 kg  BMI:  Body mass index is 24.94 kg/m.  Estimated Nutritional Needs:   Kcal:  2080-2290 kcal  Protein:  85-100 grams  Fluid:  >/= 2 L/day     Jarome Matin, MS, RD, LDN, [facility] Inpatient Clinical Dietitian Pager # [phone] After hours/weekend pager # [phone]

## 2019-10-29 NOTE — Consult Note (Signed)
[place] Nurse ostomy follow up Stoma type/location: LLQ colostomy Stomal assessment/size: 1 and 3/4 inches round, red, slightly budded, moist  Peristomal assessment: erythematous Treatment options for stomal/peristomal skin: skin barrier ring Output: light yellow gelatinous effluent Ostomy pouching: 1pc.flat ostomy pouching system with skin barrier ring Education provided: None. Patient is still critically ill in the ICU. Enrolled patient in [place] Start Discharge program: No  MD Contact via Secure Chat and 1-2 does of diflucan requested. Supplies provided to room: 5 pouches, 5 rings.   [place] nursing team will follow, seeing every 7-10 days and will remain available to this patient, the nursing and medical teams.   Thanks, Maudie Flakes, MSN, RN, [place], Arther Abbott  Pager# [phone]

## 2019-10-29 NOTE — Progress Notes (Signed)
NAME:  [name] [name], MRN:  [id number], DOB:  13-Dec-1931, LOS: 7 ADMISSION DATE:  10/19/2019, CONSULTATION DATE:  10/20/2019 REFERRING MD:  Loann Quill MD, CHIEF COMPLAINT: Acute abdomen, shock  Brief History   85 year old admitted with abdominal pain, sigmoid volvulus.  Seen by surgery and GI Underwent sigmoidoscopy on 11/27.  On 11/28 become increasingly unstable with elevated lactic acid and shock.  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 Failed decompression and went to OR for ex-lap, sigmoid colectomy and colostomy creation. Returned to ICU vented 11/29 - Weaning vent settings 12/2 off pressors for > 24 hours. Weaning but not strong enough or awake enough to come off vent. Advancing tube feeds. IV diuresis for third spacing. Still over 13 liters positive. 12/3: Still too weak to extubate 12/4: Extubated  Consults:  Surgery, GI, PCCM  Procedures:  11/28 - sigmoid colectomy with colostomy.   Significant Diagnostic Tests:  CT abdomen pelvis 10/19/2019-sigmoid volvulus with marked distention, bibasal lung opacities with small left effusion. CXR 11/28 - bibasilar infiltrates with small right pleural effusion.  Micro Data:  Blood cultures 11/27-negative  Antimicrobials:  Vanco, cefepime 11/27 Zosyn 11/27 >> 12/4  Interim history/subjective:  No acute events.  Remains globally weak.  Objective   Blood pressure 92/61, pulse 100, temperature 99 F (37.2 C), temperature source Axillary, resp. rate 18, height _0  (1.727 m), weight 74.4 kg, SpO2 98 %. CVP:  [3 mmHg-4 mmHg] 3 mmHg      Intake/Output Summary (Last 24 hours) at  10/29/2019 0746 Last data filed at 10/29/2019 0600 Gross per 24 hour  Intake 1200 ml  Output 1725 ml  Net -525 ml   Filed Weights   10/27/19 0500 10/28/19 0500 10/29/19 0133  Weight: 75.5 kg 74.3 kg 74.4 kg   Physical Exam: General: Elderly man, chronically ill appearing, in bed, no distress HEENT NG tube in place, OP moist, no icterus Pulmonary: Clear, no accessory muscle use Cardiac: IRIR, No M/R/G Abdomen: BS hypoactive.  Ostomy pink and moist GU: Foley catheter Neuro: Sleepy but arouses to voice.  Tracks appropriately.  Globally weak but moves extremities  Resolved Hospital Problem list   Sigmoid volvulus.  Septic shock Nonanion gap metabolic acidosis resolved, this was from hyperchloremia Hypernatremia  Assessment & Plan:   Sigmoid volvulus with infarction, perforation and peritonitis s/p sigmoid colectomy and colostomy creation on 11/28.  Abx completed 12/4 ( Zosyn). Plan Appreciate surgery management of wound, postop Tube feeding per NG tube.  He did not pass swallow evaluation 12/6, appreciate SLP evaluation.  He uses thickened liquids at home even at baseline  Acute hypoxemic respiratory failure requiring mechanical ventilation 2/2 above.  Extubated 12/5 and tolerated well. Small pleural effusions Plan Mobilize, PT, pulmonary hygiene Aspiration and reflux precautions.  N.p.o. for now as above 40 mg lasix x 1 this AM (had been diuresing but was held 12/6 due to relative hypotension) Will leave in ICU given global weakness with risk of re-intubation  AF  -No longer in rapid ventricular response, had required amiodarone he is now off Plan Scheduled metoprolol ordered per tube Continue coumadin per tube  Parkinson's disease.  Possible element of baseline dementia Plan Continue Symmetrel, carbidopa/levodopa, Aricept Spouse indicates  that he does not tolerate benzodiazepines well.  Clonazepam stopped, avoiding short acting benzos as able Delirium prevention  interventions Mobilization  Fluid and electrolyte imbalance: Total body volume overload and intermittent hypokalemia Good diuresis over last several days, remains 2.8L positive for total hospitalization plan Lasix x 1 as above  Rising LFTs, alk phos and bilirubin - resolved -Question mild cholestasis, stable Plan Follow LFT, bilirubin intermittently  Severe protein calorie malnutrition.  Failed SLP eval 12/5 Plan Continue tube feeding Appreciate SLP evaluation.  He uses thickened liquids at baseline-  Severe physical deconditioning Plan Physical therapy, out of bed with assistance He will need rehab of some sort and question whether he would benefit from short-term LTAC care.  Will discuss with case management 12/7  Daily Goals Checklist  Pain/Anxiety/Delirium protocol (if indicated): Avoiding sedating medications VAP protocol (if indicated): N/A DVT prophylaxis: Enoxaparin, Coumadin resumed 12/5 with goal INR 2 - 3 Nutritional status and feeding goals: Tube feeding at goal GI prophylaxis: protonix Glucose control: No coverage currently Code Status: full Family Communication: Will call Disposition:  ICU   [name], [place] [facility] 10/29/2019, 8:03 AM

## 2019-10-29 NOTE — Progress Notes (Signed)
Patient ID: [name] [name], male   DOB: 03/17/1932, 85 y.o.   MRN: [id number]    9 Days Post-Op  Subjective: Less alert today.  Can barely phonate to talk to me.  Only opens eyes mostly to strong encouragement.  ROS: unable   Objective: Vital signs in last 24 hours: Temp:  [98.1 F (36.7 C)-99.6 F (37.6 C)] 99 F (37.2 C) (12/07 0400) Pulse Rate:  [78-102] 100 (12/07 0600) Resp:  [15-24] 18 (12/07 0600) BP: (78-100)/(48-81) 92/61 (12/07 0600) SpO2:  [83 %-99 %] 98 % (12/07 0600) Weight:  [74.4 kg] 74.4 kg (12/07 0133) Last BM Date: 10/28/19  Intake/Output from previous day: 12/06 0701 - 12/07 0700 In: 1200 [NG/GT:1200] Out: 1725 [Urine:950; Stool:775] Intake/Output this shift: No intake/output data recorded.  PE: Gen: decrease alertness Heart: irregular Abd: soft, minimally tender, +S, colostomy with thin tan output.  No definitively feculent.  Stoma is pink and viable.  Midline wound is clean and packed.  Lab Results:  Recent Labs    10/28/19 0400 10/29/19 0430  WBC 9.2 9.9  HGB 11.4* 11.4*  HCT 35.7* 35.9*  PLT 150 183   BMET Recent Labs    10/28/19 0400 10/29/19 0430  NA 142 141  K 3.3* 3.7  CL 105 108  CO2 27 25  GLUCOSE 118* 98  BUN 27* 25*  CREATININE 0.89 0.83  CALCIUM 7.9* 8.0*   PT/INR Recent Labs    10/28/19 0400 10/29/19 0430  LABPROT 15.8* 19.0*  INR 1.3* 1.6*   CMP     Component Value Date/Time   NA 141 10/29/2019 0430   NA 143 09/10/2019 1042   K 3.7 10/29/2019 0430   CL 108 10/29/2019 0430   CO2 25 10/29/2019 0430   GLUCOSE 98 10/29/2019 0430   BUN 25 (H) 10/29/2019 0430   BUN 26 09/10/2019 1042   CREATININE 0.83 10/29/2019 0430   CREATININE 1.16 (H) 12/25/2015 1505   CALCIUM 8.0 (L) 10/29/2019 0430   PROT 5.0 (L) 10/29/2019 0430   ALBUMIN 2.3 (L) 10/29/2019 0430   AST 35 10/29/2019 0430   ALT 7 10/29/2019 0430   ALKPHOS 120 10/29/2019 0430   BILITOT 2.0 (H) 10/29/2019 0430   GFRNONAA >60 10/29/2019 0430   GFRAA >60  10/29/2019 0430   Lipase     Component Value Date/Time   LIPASE 20 10/19/2019 1400       Studies/Results: No results found.  Anti-infectives: Anti-infectives (From admission, onward)   Start     Dose/Rate Route Frequency Ordered Stop   10/19/19 2200  piperacillin-tazobactam (ZOSYN) IVPB 3.375 g  Status:  Discontinued     3.375 g 12.5 mL/hr over 240 Minutes Intravenous Every 8 hours 10/19/19 1802 10/27/19 0159   10/19/19 1700  vancomycin (VANCOCIN) 1,500 mg in sodium chloride 0.9 % 500 mL IVPB     1,500 mg 250 mL/hr over 120 Minutes Intravenous  Once 10/19/19 1636 10/19/19 1958   10/19/19 1630  ceFEPIme (MAXIPIME) 2 g in sodium chloride 0.9 % 100 mL IVPB     2 g 200 mL/hr over 30 Minutes Intravenous  Once 10/19/19 1629 10/19/19 1911       Assessment/Plan Parkinson's disease ([place]) Displaced intertrochanteric fracture of left femur, initial encounter for closed fracture (HCC) Chronic systolic CHF (congestive heart failure) ([place]) Dementia without behavioral disturbance (HCC) Sigmoid volvulus (HCC) Hypokalemia Hypotension RUE swelling -DVT studyneg Transaminitis - resolving VDRF - extubated 12/4 Dysphagia - exchange large NGT for small cortrak today for feeding.  Repeat  swallow planned for today, but given patient's decrease in alertness, doubt he will pass for a diet.  POD 9, S/PExploratory laparotomy, sigmoid colectomy, descending colostomy. Dr. Gerrit Friends for Sigmoid Volvuluswith infarction, perforation, and feculent peritonitis -goodostomy output,TFs per protocol at this point -swallow eval today - qshift wet-to-dry dressing changes to midline wound - Protonix twice daily -pulm toilet and IS -mobilize as able; PT/OT; anticipate will need SNF/LTAC.  FEN:TF, protonix  VTE: SCD's, lovenox, warfarin has been restarted RA:QTMAU 11/27-12/05,afebrile, WBCremains normal; exam benign and stoma functional; tolerating tube feeds Foley:no Follow  up:Dr.Gerkin   LOS: 10 days    Letha Cape , [facility] 10/29/2019, 8:07 AM Please see Amion for pager number during day hours 7:00am-4:30pm

## 2019-10-29 NOTE — Progress Notes (Signed)
SLP Cancellation Note  Patient Details Name: [name] [name] MRN: [id number] DOB: [birth year]   Cancelled treatment:       Reason Eval/Treat Not Completed: Fatigue/lethargy limiting ability to participate(per surgery note, pt not alert and barely phonating today, will continue efforts)   [name] 10/29/2019, 8:35 AM  Luanna Salk, MS [facility] SLP Acute Rehab Services Pager [phone] Office [phone]

## 2019-10-29 NOTE — Progress Notes (Signed)
[place] for Warfarin Indication: MV angioplasty  Allergies  Allergen Reactions  . Apixaban Palpitations, Rash and Other (See Comments)    Worsening tardive dyskinesia and rapid heart rate  . Ace Inhibitors Cough  . Demerol [Meperidine] Other (See Comments)    Parkinsons disease  . Poison Ivy Extract Itching, Swelling and Rash   Patient Measurements: Height: 5' 8"  (172.7 cm) Weight: 164 lb 0.4 oz (74.4 kg) IBW/kg (Calculated) : 68.4  Vital Signs: Temp: 99 F (37.2 C) (12/07 0400) Temp Source: Axillary (12/07 0400) BP: 92/61 (12/07 0600) Pulse Rate: 100 (12/07 0600)  Labs: Recent Labs    10/27/19 0325 10/28/19 0400 10/29/19 0430  HGB 11.4* 11.4* 11.4*  HCT 36.3* 35.7* 35.9*  PLT 138* 150 183  LABPROT 16.3* 15.8* 19.0*  INR 1.3* 1.3* 1.6*  CREATININE  --  0.89 0.83   Estimated Creatinine Clearance: 63 mL/min (by C-G formula based on SCr of 0.83 mg/dL).  Medical History: Past Medical History:  Diagnosis Date  . Dyslipidemia   . Parkinson's disease ([place])   . S/P mitral valve replacement    mitral regurg, endocarditis  . Stroke [facility])    Medications:  Scheduled:  . amantadine  100 mg Per Tube 2 times per day  . Carbidopa-Levodopa ER  1 capsule Per NG tube QHS  . Carbidopa-Levodopa ER  2 capsule Per NG tube Daily  . Carbidopa-Levodopa ER  3 capsule Per NG tube 2 times per day  . chlorhexidine gluconate (MEDLINE KIT)  15 mL Mouth Rinse BID  . Chlorhexidine Gluconate Cloth  6 each Topical Daily  . donepezil  10 mg Per Tube QHS  . enoxaparin (LOVENOX) injection  40 mg Subcutaneous Q24H  . escitalopram  10 mg Per Tube Daily  . free water  200 mL Per Tube Q6H  . furosemide  40 mg Intravenous Once  . mouth rinse  15 mL Mouth Rinse 10 times per day  . metoprolol tartrate  12.5 mg Per Tube BID  . pantoprazole (PROTONIX) IV  40 mg Intravenous Daily  . potassium chloride  40 mEq Per Tube Once  . rotigotine  1 patch Transdermal  Daily  . sodium chloride flush  10-40 mL Intracatheter Q12H  . Warfarin - Pharmacist Dosing Inpatient   Does not apply q1800   Assessment:  22 yoM with sigmoid volvulus perforation > feculent peritonitis, 11/27 sigmoidoscopy, 11/28 colectomy with colostomy.  Intubated 11/28 >> 12/4 extubated, plan resume Warfarin once passes swallow study. INR goal 2-3 per Coumadin clinic notes. PTA Warfarin 2.35m daily, LD 11/26   Today, 10/29/2019 Did not pass swallow study, but tolerating meds via tube. INR remains 1.6, H/H stable, Plt improved 150  Goal of Therapy:  INR 2-3 Monitor platelets by anticoagulation protocol: Yes   Plan:  Warfarin 592mvia tube x1 today at 1800 Continuing Lovenox 4065maily Daily Protime/INR    NikRoyetta AsalharmD, BCPS 10/29/2019 11:12 AM

## 2019-10-30 DIAGNOSIS — F0150 Vascular dementia without behavioral disturbance: Secondary | ICD-10-CM

## 2019-10-30 DIAGNOSIS — R1310 Dysphagia, unspecified: Secondary | ICD-10-CM

## 2019-10-30 LAB — BASIC METABOLIC PANEL
Anion gap: 8 (ref 5–15)
BUN: 23 mg/dL (ref 8–23)
CO2: 25 mmol/L (ref 22–32)
Calcium: 7.7 mg/dL — ABNORMAL LOW (ref 8.9–10.3)
Chloride: 107 mmol/L (ref 98–111)
Creatinine, Ser: 0.76 mg/dL (ref 0.61–1.24)
GFR calc Af Amer: >60 mL/min (ref >60)
GFR calc non Af Amer: >60 mL/min (ref >60)
Glucose, Bld: 130 mg/dL — ABNORMAL HIGH (ref 70–99)
Potassium: 3.2 mmol/L — ABNORMAL LOW (ref 3.5–5.1)
Sodium: 140 mmol/L (ref 135–145)

## 2019-10-30 LAB — CBC
HCT: 35.7 % — ABNORMAL LOW (ref 39.0–52.0)
Hemoglobin: 11.5 g/dL — ABNORMAL LOW (ref 13.0–17.0)
MCH: 31.9 pg (ref 26.0–34.0)
MCHC: 32.2 g/dL (ref 30.0–36.0)
MCV: 99.2 fL (ref 80.0–100.0)
Platelets: 211 10*3/uL (ref 150–400)
RBC: 3.6 MIL/uL — ABNORMAL LOW (ref 4.22–5.81)
RDW: 16.9 % — ABNORMAL HIGH (ref 11.5–15.5)
WBC: 9.9 10*3/uL (ref 4.0–10.5)
nRBC: 0 % (ref 0.0–0.2)

## 2019-10-30 LAB — GLUCOSE, CAPILLARY
Glucose-Capillary: 112 mg/dL — ABNORMAL HIGH (ref 70–99)
Glucose-Capillary: 127 mg/dL — ABNORMAL HIGH (ref 70–99)
Glucose-Capillary: 124 mg/dL — ABNORMAL HIGH (ref 70–99)
Glucose-Capillary: 107 mg/dL — ABNORMAL HIGH (ref 70–99)
Glucose-Capillary: 84 mg/dL (ref 70–99)
Glucose-Capillary: 118 mg/dL — ABNORMAL HIGH (ref 70–99)

## 2019-10-30 LAB — MAGNESIUM: Magnesium: 1.8 mg/dL (ref 1.7–2.4)

## 2019-10-30 LAB — PHOSPHORUS: Phosphorus: 2.6 mg/dL (ref 2.5–4.6)

## 2019-10-30 LAB — PROTIME-INR
INR: 2.3 — ABNORMAL HIGH (ref 0.8–1.2)
Prothrombin Time: 25.1 seconds — ABNORMAL HIGH (ref 11.4–15.2)

## 2019-10-30 MED ORDER — WARFARIN SODIUM 2.5 MG PO TABS
2.5000 mg | ORAL_TABLET | Freq: Once | ORAL | Status: AC
  Administered 2019-10-30: 2.5 mg via ORAL
  Filled 2019-10-30: qty 1

## 2019-10-30 MED ORDER — DEXTROSE-NACL 5-0.45 % IV SOLN
INTRAVENOUS | Status: DC
  Administered 2019-10-30 – 2019-11-03 (×6): via INTRAVENOUS

## 2019-10-30 MED ORDER — POTASSIUM CHLORIDE 20 MEQ PO PACK
40.0000 meq | PACK | Freq: Once | ORAL | Status: AC
  Administered 2019-10-30: 13:00:00 40 meq via ORAL
  Filled 2019-10-30: qty 2

## 2019-10-30 MED ORDER — DEXTROSE 50 % IV SOLN
12.5000 g | INTRAVENOUS | Status: AC
  Administered 2019-10-30: 12.5 g via INTRAVENOUS
  Filled 2019-10-30: qty 50

## 2019-10-30 NOTE — Progress Notes (Signed)
Pt's CBG was 84. Pt is currently NPO. RN notified on-call Physician.  NP Kyere ordered Dextrose 50% solution 12.5g STAT  CBG was rechecked as ordered 15 minutes after intervention was completed Follow up CBG was 118. Fluids were changed to D5-0.45NS per order. RN will continue to monitor the pt closely

## 2019-10-30 NOTE — Progress Notes (Signed)
[place] for Warfarin Indication: MV angioplasty  Allergies  Allergen Reactions  . Apixaban Palpitations, Rash and Other (See Comments)    Worsening tardive dyskinesia and rapid heart rate  . Ace Inhibitors Cough  . Demerol [Meperidine] Other (See Comments)    Parkinsons disease  . Poison Ivy Extract Itching, Swelling and Rash   Patient Measurements: Height: 5' 8"  (172.7 cm) Weight: 163 lb 9.3 oz (74.2 kg) IBW/kg (Calculated) : 68.4  Vital Signs: Temp: 97.6 F (36.4 C) (12/08 0800) Temp Source: Axillary (12/08 0800) BP: 83/53 (12/08 1000) Pulse Rate: 70 (12/08 1000)  Labs: Recent Labs    10/28/19 0400 10/29/19 0430 10/30/19 0420  HGB 11.4* 11.4* 11.5*  HCT 35.7* 35.9* 35.7*  PLT 150 183 211  LABPROT 15.8* 19.0* 25.1*  INR 1.3* 1.6* 2.3*  CREATININE 0.89 0.83 0.76   Estimated Creatinine Clearance: 65.3 mL/min (by C-G formula based on SCr of 0.76 mg/dL).  Medical History: Past Medical History:  Diagnosis Date  . Dyslipidemia   . Parkinson's disease ([place])   . S/P mitral valve replacement    mitral regurg, endocarditis  . Stroke [facility])    Medications:  Scheduled:  . amantadine  100 mg Per Tube 2 times per day  . Carbidopa-Levodopa ER  1 capsule Per NG tube QHS  . Carbidopa-Levodopa ER  2 capsule Per NG tube Daily  . Carbidopa-Levodopa ER  3 capsule Per NG tube 2 times per day  . chlorhexidine gluconate (MEDLINE KIT)  15 mL Mouth Rinse BID  . Chlorhexidine Gluconate Cloth  6 each Topical Daily  . donepezil  10 mg Per Tube QHS  . enoxaparin (LOVENOX) injection  40 mg Subcutaneous Q24H  . escitalopram  10 mg Per Tube Daily  . feeding supplement (PRO-STAT SUGAR FREE 64)  30 mL Oral BID  . free water  200 mL Per Tube Q6H  . mouth rinse  15 mL Mouth Rinse 10 times per day  . pantoprazole (PROTONIX) IV  40 mg Intravenous Daily  . rotigotine  1 patch Transdermal Daily  . sodium chloride flush  10-40 mL Intracatheter Q12H  .  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  Intubated 11/28 >> 12/4 extubated, plan resume Warfarin once passes swallow study. INR goal 2-3 per Coumadin clinic notes. PTA Warfarin 2.77m daily, LD 11/26   Today, 10/30/2019  Did not pass swallow study, but tolerating meds via tube.  Repeat swallow study on 12/7 was cancelled due to lethargy   INR is 2.3, therapeutic, H/H stable, Plt now WNL at 211   Goal of Therapy:  INR 2-3 Monitor platelets by anticoagulation protocol: Yes   Plan:   Warfarin 2.568mvia tube x1 today at 1800  As INR is therapeutic today, discontinue Lovenox 4011maily  Daily Protime/INR    NikRoyetta AsalharmD, BCPS 10/30/2019 10:42 AM

## 2019-10-30 NOTE — Progress Notes (Addendum)
  Speech Language Pathology Treatment: Dysphagia  Patient Details Name: [name] [name] MRN: [id number] DOB: Nov 17, 1932 Today's Date: 10/30/2019 Time:  -     Assessment / Plan / Recommendation Clinical Impression   UPon entrance to room, pt asleep but with gentle verbal stimulation - would awaken.  He allowed SLP to provide oral care and swished and expectorated with water without anterior labial loss.  With moderate verbal cues, pt able to speak loudly - but this is not sustained.     Accepted po intake of tsps water, Ensure, applesauce.  Demonstrates delayed swallow and subtle indications of pharyngeal dysphagia c/b subtle throat clearing and intermittent multiple swallows.  Cued cough/swallow able to be followed approximately 75% of opportunities.  Fewer indications of pharyngeal dysphagia with Ensure and tsps of water compared to cup boluses apparent with occasional belching post-swallow.  Hopefully pt  may be able to tolerate a modified diet to mitigate his risk and maximize his QOL.  Acute on chronic dysphagia present per review of notes.    Recommend to proceed with MBS to help in determining care plan and least restrictive diet given wife desire for QOL over quantity per RN.    Pt agreeable to plan.  Spoke to RN who agrees with care plan.  Messaged MD for orders, SLP will see in am - hopefully for early MBS.      HPI HPI: 85 y.o. male with medical history significant of Parkinson's disease, mitral valve repair with history of endocarditis and mitral valve angioplasty in 2008, paroxysmal atrial fibrillation on Coumadin (previously on Eliquis but had inflammatory reaction), history of TIA, hyperlipidemia  was brought into the ED with complaint of abdominal pain.  History provided by wife who is at the bedside since patient is completely lethargic.  According to wife, he started having abdominal pain around 3 in the morning.  No further information about abdominal pain.  According to her, his last  bowel movement was sometime yesterday but small amount of BM he passed this morning.  No history of nausea, vomiting, fever, chills, chest pain, shortness of breath or any other complaint..  Pt is s/p surgical intervention sigmoid colectomy, descending colostomy. Dr. Harlow Asa for Sigmoid Volvulus with infarction, perforation, and feculent peritonitis.  Pt has been seen by SLP for clinical swallow evaluation on 12/5/ coughing with thin liquids prior to intake per notes.  Has have small bore feeding tube but pulled it out today.  SLP follow for po readiness.      SLP Plan  MBS(10/31/2019)       Recommendations  Diet recommendations: NPO(medications and tsps water) Liquids provided via: Teaspoon Medication Administration: Crushed with puree Compensations: (assure pt swallows before giving more, oral suction prn) Postural Changes and/or Swallow Maneuvers: Seated upright 90 degrees;Upright 30-60 min after meal                Oral Care Recommendations: Oral care QID Follow up Recommendations: 24 hour supervision/assistance;Other (comment)(or ) SLP Visit Diagnosis: Dysphagia, oropharyngeal phase (R13.12);Dysphagia, pharyngoesophageal phase (R13.14) Plan: MBS(10/31/2019)       GO                [name] 10/30/2019, 5:16 PM  Luanna Salk, [facility] SLP Acute Rehab Services Pager [phone] Office [phone]

## 2019-10-30 NOTE — Progress Notes (Signed)
[facility]                                                                              Patient Demographics  [name] [name], is a 85 y.o. 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 fib on Coumadin (previously on Eliquis but had inflammatory reaction), prior history of TIA, hyperlipidemia was admitted with abdominal pain, sigmoid volvulus.   He was seen by surgery and GI.  Patient underwent underwent sigmoidoscopy on 11/27 for decompression.  On 11/28 become increasingly unstable with elevated lactic acid and shock.  Failed decompression and went to the OR for ex lap, sigmoid colectomy, colostomy creation, was intubated and admitted to ICU.  Patient was under critical care service, transfer to hospitalist service on 10/30/2019.  Significant hospital events 11/27- Admit, underwent flex sig for decompression 11/28- Worsening shock and lactic acidosis. Failed decompression and went to OR for ex-lap, sigmoid colectomy and colostomy creation. Returned to ICU vented 11/29 - Weaning vent settings 12/2 off pressors for > 24 hours. Weaning but not strong enough or awake enough to come off vent. Advancing tube feeds. IV diuresis for third spacing. Still over 13 liters positive. 12/3: Still too weak to extubate 12/4: Extubated 12/8: [facility] assumes care    Assessment & Plan   Principal problem Sigmoid volvulus with perforation and peritonitis, status post sigmoid colectomy, colostomy creation    -Patient presented with sigmoid volvulus, failed flex sig with decompression, underwent sigmoid colectomy, colostomy on 11/28, found to have infarction with perforation and feculent peritonitis -General surgery following closely, recommended wet-to-dry dressing every shift -Continue PPI, pulmonary toilet and IS, mobilize -IV Zosyn completed on 12/4  Dysphagia -Swallow evaluation pending, did not pass swallow evaluation on 12/6.  At baseline, on thickened liquids - receiving tube feeds via NGT  Acute respiratory failure with hypoxia, small pleural effusion status post mechanical ventilation -Patient was extubated on 12/5 -Continue aspiration and reflux precautions, n.p.o. -Received Lasix on 12/7, however BP soft, will not tolerate any diuresis -Mobilize, pulmonary toilet, incentive spirometry -O2 sats 100% on room air  Paroxysmal A. fib with RVR -Currently heart rate controlled, BP in 80s, will hold scheduled metoprolol -Continue Coumadin per tube  Acute metabolic encephalopathy with history of Parkinson's disease, dementia -Continue amantadine, carbidopa/levodopa, Aricept -Per spouse, does not tolerate benzodiazepines well -Continue delirium prevention interventions, mobilization  Hypokalemia Replaced via tube  Severe protein calorie malnutrition, severe physical deconditioning -Continue tube feeds, SLP evaluation pending -PT OT evaluation, will likely need SNF or short-term LTAC for rehab   Code Status: DNR DVT Prophylaxis: Warfarin has been restarted Family Communication: Discussed all imaging results,  lab results, explained to the patient.  No family member at the bedside   Disposition Plan: Will likely need skilled nursing facility  Time Spent in minutes   40 minutes  Procedures:  11/28 - sigmoid colectomy with colostomy.  Intubation and extubation  Consultants:   GI PCCM General surgery  Antimicrobials:   Anti-infectives (From admission, onward)   Start      Dose/Rate Route Frequency Ordered Stop   10/29/19 1600  fluconazole (DIFLUCAN) IVPB 100 mg     100 mg 50 mL/hr over 60 Minutes Intravenous  Once 10/29/19 1510 10/29/19 1750   10/19/19 2200  piperacillin-tazobactam (ZOSYN) IVPB 3.375 g  Status:  Discontinued     3.375 g 12.5 mL/hr over 240 Minutes Intravenous Every 8 hours 10/19/19 1802 10/27/19 0159   10/19/19 1700  vancomycin (VANCOCIN) 1,500 mg in sodium chloride 0.9 % 500 mL IVPB     1,500 mg 250 mL/hr over 120 Minutes Intravenous  Once 10/19/19 1636 10/19/19 1958   10/19/19 1630  ceFEPIme (MAXIPIME) 2 g in sodium chloride 0.9 % 100 mL IVPB     2 g 200 mL/hr over 30 Minutes Intravenous  Once 10/19/19 1629 10/19/19 1911         Medications  Scheduled Meds:  amantadine  100 mg Per Tube 2 times per day   Carbidopa-Levodopa ER  1 capsule Per NG tube QHS   Carbidopa-Levodopa ER  2 capsule Per NG tube Daily   Carbidopa-Levodopa ER  3 capsule Per NG tube 2 times per day   chlorhexidine gluconate (MEDLINE KIT)  15 mL Mouth Rinse BID   Chlorhexidine Gluconate Cloth  6 each Topical Daily   donepezil  10 mg Per Tube QHS   escitalopram  10 mg Per Tube Daily   feeding supplement (PRO-STAT SUGAR FREE 64)  30 mL Oral BID   free water  200 mL Per Tube Q6H   mouth rinse  15 mL Mouth Rinse 10 times per day   pantoprazole (PROTONIX) IV  40 mg Intravenous Daily   rotigotine  1 patch Transdermal Daily   sodium chloride flush  10-40 mL Intracatheter Q12H   warfarin  2.5 mg Oral ONCE-1800   Warfarin - Pharmacist Dosing Inpatient   Does not apply q1800   Continuous Infusions:  dextrose 10 mL/hr at 10/30/19 0836   feeding supplement (OSMOLITE 1.5 CAL) 50 mL/hr at 10/30/19 0000   PRN Meds:.acetaminophen, ondansetron (ZOFRAN) IV, sodium chloride flush      Subjective:   [name] [name] was seen and examined today.  Very weak and deconditioned, alert and awake.  Tries to vocalize.  Denies any acute pain.  No fevers or chills,  BP soft.  No acute shortness of breath.  No acute events overnight.    Objective:   Vitals:   10/30/19 0700 10/30/19 0800 10/30/19 0900 10/30/19 1000  BP: (!) 95/57 (!) 87/56 (!) 94/57 (!) 83/53  Pulse: 77 74 68 70  Resp: _0 Temp:  97.6 F (36.4 C)    TempSrc:  Axillary    SpO2: 100% 100% 99% 100%  Weight:      Height:        Intake/Output Summary (Last 24 hours) at 10/30/2019 1049 Last data filed at 10/30/2019 0836 Gross per 24 hour  Intake 943.49 ml  Output 3600 ml  Net -2656.51 ml     Wt Readings from Last 3 Encounters:  10/30/19 74.2 kg  09/14/19 77 kg  09/05/19 77 kg  Exam  General: Alert and awake, NAD, ill-appearing, debilitated  Eyes:   HEENT:  Atraumatic, normocephalic  cardiovascular: S1 S2 auscultated,  RRR  Respiratory: Decreased breath sound at the bases  Gastrointestinal: Soft midline wound dressing intact, colostomy+   Ext: Trace pedal edema bilaterally  Neuro: No new deficits, globally weak  Musculoskeletal: No digital cyanosis, clubbing  Skin: Midline abdominal wound dressing intact, otherwise no rashes  Psych: Alert and awake   Data Reviewed:  I have personally reviewed following labs and imaging studies  Micro Results No results found for this or any previous visit (from the past 240 hour(s)).  Radiology Reports Dg Abd 1 View  Result Date: 10/20/2019 CLINICAL DATA:  OG tube placement EXAM: ABDOMEN - 1 VIEW COMPARISON:  10/20/2019 FINDINGS: OG tube tip is in the proximal stomach near the GE junction. The side port is in the distal esophagus. Dilated bowel loops in the upper abdomen, decreased since prior study. IMPRESSION: OG tube tip in the proximal stomach near the GE junction with the side port in the distal esophagus. Electronically Signed   By: Rolm Baptise M.D.   On: 10/20/2019 20:56   Dg Abd 1 View  Result Date: 10/20/2019 CLINICAL DATA:  Volvulus of colon. EXAM: ABDOMEN - 1 VIEW COMPARISON:  CT abdomen dated  10/19/2019. FINDINGS: Persistent gaseous distention of the distal colon, but at least mildly improved compared to the dilatation demonstrated on yesterday's abdomen and pelvis CT. Stool is present within the more proximal colon. No evidence of free intraperitoneal air is seen, although characterization is limited by the supine patient positioning. IMPRESSION: 1. Persistent gaseous distention of the sigmoid colon, but at least mildly improved compared to the dilatation demonstrated on yesterday's CT status post interval flexible sigmoidoscopy. 2. No obvious evidence of free intraperitoneal air, but characterization is limited by supine patient positioning and incomplete visualization of the upper abdomen. Electronically Signed   By: Franki Cabot M.D.   On: 10/20/2019 05:01   Ct Abdomen Pelvis W Contrast  Result Date: 10/19/2019 CLINICAL DATA:  Abdominal pain that started around 6 o'clock today. EXAM: CT ABDOMEN AND PELVIS WITH CONTRAST TECHNIQUE: Multidetector CT imaging of the abdomen and pelvis was performed using the standard protocol following bolus administration of intravenous contrast. CONTRAST:  163m OMNIPAQUE IOHEXOL 300 MG/ML  SOLN COMPARISON:  None FINDINGS: Lower chest: Signs of mitral valve replacement. Cardiac enlargement. Heart is incompletely imaged. Basilar airspace disease with moderate right and small left pleural effusion. Hepatobiliary: Limited assessment due to arm position, technical factors and edema, no gross abnormality. Pancreas: Also with some limited assessment on today's study. Grossly normal. Spleen: Normal in size without focal abnormality. Adrenals/Urinary Tract: Normal adrenal glands. Marked renal cortical scarring right worse than left. No signs of hydronephrosis. Small left renal cyst. Stomach/Bowel: Bowel is difficult to follow throughout the abdomen. The colon is markedly distended, particularly the sigmoid colon. There is a twist in the sigmoid colon with configuration  that is compatible with sigmoid volvulus. Bowel edema is present. No signs of pneumatosis at the current time. Small volume ascites. Other bowel loops are difficult to follow as described. Vascular/Lymphatic: Patent abdominal vasculature. No signs of adenopathy in the abdomen or pelvis. Reproductive: Limited assessment of the prostate and pelvic structures secondary to streak artifact from previous ORIF of a left proximal femur fracture. Other: Bowel edema. No current signs of free air. Small volume ascites. Musculoskeletal: No signs of acute or destructive bone process. Postoperative changes related to intertrochanteric fracture ORIF  with femoral nailing and hip screw placement partially visualized. Osteopenia. IMPRESSION: 1. Signs of sigmoid volvulus with marked distension of the sigmoid colon. Small volume ascites, no free air or pneumatosis. Surgical and or GI consultation is suggested for consideration of reduction. 2. Basilar airspace disease with moderate effusion, raise the question of pneumonia or aspiration at the right lung base. Small left effusion as well. 3. Cardiac enlargement. 4. Marked renal cortical scarring right worse than left. 5. Osteopenia. 6. Postoperative changes related to previous ORIF of a left proximal femur fracture. 7. These results were called by telephone at the time of interpretation on 10/19/2019 at 3:34 pm to provider [facility] , who verbally acknowledged these results. Electronically Signed   By: Zetta Bills M.D.   On: 10/19/2019 15:36   Dg Chest Port 1 View  Result Date: 10/27/2019 CLINICAL DATA:  Acute respiratory failure with hypoxia EXAM: PORTABLE CHEST 1 VIEW COMPARISON:  Radiograph 10/26/2019 FINDINGS: Interval extubation. NG tube remains in stomach. Sternotomy wires overlie stable cardiac silhouette. There bilateral pleural effusions unchanged. No pulmonary edema. IMPRESSION: 1. Interval extubation without complication. 2. No change in bilateral pleural  effusions. Electronically Signed   By: Suzy Bouchard M.D.   On: 10/27/2019 07:28   Dg Chest Port 1 View  Result Date: 10/26/2019 CLINICAL DATA:  Pleural effusion shock, recent sigmoid volvulus and sigmoidoscopy EXAM: PORTABLE CHEST 1 VIEW COMPARISON:  Chest radiograph from one day prior. FINDINGS: Endotracheal tube tip is 4.7 cm above the carina. Enteric tube enters stomach with the tip not seen on this image. Left subclavian central venous catheter terminates in the left brachiocephalic vein just to the right of midline. Intact sternotomy wires. Loop recorder overlies the left chest. Mitral annuloplasty ring in place. Stable cardiomediastinal silhouette with mild cardiomegaly. No pneumothorax. Small bilateral pleural effusions, right greater than left, stable with stable bibasilar atelectasis. No overt pulmonary edema. IMPRESSION: 1. Well-positioned support structures.  No pneumothorax. 2. Small bilateral pleural effusions with bibasilar atelectasis, right greater than left, stable. 3. Stable cardiomegaly without overt pulmonary edema. Electronically Signed   By: Ilona Sorrel M.D.   On: 10/26/2019 09:26   Dg Chest Port 1 View  Result Date: 10/25/2019 CLINICAL DATA:  Acute respiratory failure/hypoxia. EXAM: PORTABLE CHEST 1 VIEW COMPARISON:  10/22/2019 FINDINGS: Endotracheal tube has tip 4.8 cm above the carina. Enteric tube has tip and side-port over the stomach in the left upper quadrant. Left subclavian central venous catheter unchanged with tip over the SVC. Loop recorder projects over the left are unchanged. Lungs are adequately inflated and demonstrate persistent hazy opacification over the right mid to lower lung and left base likely layering effusions with associated atelectasis. Infection in the lung bases is possible. No pneumothorax. Mild stable cardiomegaly. Remainder of the exam is unchanged. IMPRESSION: 1. Stable bilateral pleural effusions right greater than left likely with associated  atelectasis. Infection in the lung bases is possible. 2.  Tubes and lines as described. Electronically Signed   By: Marin Olp M.D.   On: 10/25/2019 09:54   Dg Chest Port 1 View  Result Date: 10/22/2019 CLINICAL DATA:  NG tube placement. EXAM: PORTABLE CHEST 1 VIEW COMPARISON:  08/20/2019 FINDINGS: The NG tube tip remains in the region of the gastroesophageal junction. Persistent cardiomegaly. Persistent bilateral effusions, slightly increased on the left. Pulmonary vascularity is normal. Endotracheal tube in good position. IMPRESSION: 1. Persistent bilateral pleural effusions, slightly increased on the left. 2. No change in the position of the nasogastric tube with  the tip at the GE junction. 3. Persistent cardiomegaly. Electronically Signed   By: Lorriane Shire M.D.   On: 10/22/2019 12:15   Dg Chest Port 1 View  Result Date: 10/20/2019 CLINICAL DATA:  Central line placement EXAM: PORTABLE CHEST 1 VIEW COMPARISON:  Radiograph 10/19/2019 FINDINGS: Endotracheal tube appropriately positioned within the mid trachea, 4.4 cm from the carina. Transesophageal tube tip is positioned at the level of the GE junction. Should be advanced at least 10 cm to position the side port beyond the GE junction for optimal functioning. Left subclavian line terminates in the brachiocephalic vein. Loop recorder again projects over the heart. Post sternotomy changes with evidence of mitral valve replacement with stable cardiomegaly. Bibasilar areas of consolidative opacity more pronounced in the right lung base. Obscuration of the right hemidiaphragm likely reflecting a layering effusion. Degenerative changes are present in the imaged spine and shoulders. No acute osseous or soft tissue abnormality. IMPRESSION: 1. Transesophageal tube tip is positioned at the level of the GE junction. Should be advanced at least 10 cm to position the GE junction for optimal functioning. 2. Satisfactory positioning of the endotracheal tube in the  mid trachea. 3. Left subclavian line terminates in the brachiocephalic vein, consider advancing centrally. 4. Bibasilar areas of consolidative opacity, more pronounced in the right lung base, consistent with pneumonia. 5. Likely layering right effusion. These results will be called to the ordering clinician or representative by the Radiologist Assistant, and communication documented in the PACS or zVision Dashboard. Electronically Signed   By: Lovena Le M.D.   On: 10/20/2019 21:00   Dg Chest Portable 1 View  Result Date: 10/19/2019 CLINICAL DATA:  Possible pneumonia on CT EXAM: PORTABLE CHEST 1 VIEW COMPARISON:  Portable exam 1603 hours compared to 03/05/2015 FINDINGS: Enlargement of cardiac silhouette post MVR. Loop recorder projects over chest. Stable mediastinal contours. Bibasilar infiltrates greater on RIGHT consistent with pneumonia. No pleural effusion or pneumothorax. Bowel interposition between liver and diaphragm. Bones demineralized with chronic LEFT rotator cuff tear. IMPRESSION: Enlargement of cardiac silhouette post MVR. Bibasilar infiltrate particularly on RIGHT consistent with pneumonia. Electronically Signed   By: Lavonia Dana M.D.   On: 10/19/2019 16:12   Dg Abd Portable 1v  Result Date: 10/29/2019 CLINICAL DATA:  Feeding tube placement. EXAM: PORTABLE ABDOMEN - 1 VIEW COMPARISON:  Radiograph dated 10/22/2019 FINDINGS: Feeding tube tip is in the body of the stomach. No dilated bowel loops. Bilateral pleural effusions and slight atelectasis at the left lung base. IMPRESSION: 1. Feeding tube tip is in the body of the stomach. 2. Bilateral pleural effusions and slight atelectasis at the left lung base. Electronically Signed   By: Lorriane Shire M.D.   On: 10/29/2019 11:12   Dg Abd Portable 1v  Addendum Date: 10/22/2019   ADDENDUM REPORT: 10/22/2019 10:56 ADDENDUM: These results were called by telephone at the time of interpretation on 10/22/2019 at 10:55 am to provider [facility] ,  who verbally acknowledged these results. Possibility of a malpositioned nasogastric tube was discussed given the lateral position. A chest radiograph was suggested to assess position further and ensure that is not placed within the tracheobronchial tree. Also moderate amount of free air, volume visible on supine radiograph while nonspecific postoperatively may be more than expected, clinical correlation and further imaging as indicated was suggested. Electronically Signed   By: Zetta Bills M.D.   On: 10/22/2019 10:56   Result Date: 10/22/2019 CLINICAL DATA:  NG tube placement. EXAM: PORTABLE ABDOMEN - 1 VIEW  COMPARISON:  Chest x-ray 10/20/2019 FINDINGS: Signs of free air along the right upper quadrant with Rigler's sign, outlining the colon in this location. An enteric tube is partially visualized passing into the left upper quadrant, slightly more lateral than would be expected based on previous imaging assessments of this area. Signs of valvular replacement in the chest partially visualized. IMPRESSION: 1. Moderate free air suspected in the right upper quadrant. Patient is recent postop, suggest correlation with any changes in symptoms to determine whether further imaging with CT may be necessary as the amount of free air suggested may be more than expected. 2. Enteric tube is partially visualized into the left upper quadrant, slightly more lateral than would be expected on previous imaging., this may be in the proximal stomach. Suggest correlation with chest radiography prior to tube manipulation to ensure that it passes below the carina and is in fact in the esophagus, not the airway. 3. A call is out to the referring provider to further discuss findings in the above case. Electronically Signed: By: Zetta Bills M.D. On: 10/22/2019 10:43   Vas [place] Ue Dvt  Result Date: 10/22/2019 UPPER VENOUS STUDY  Indications: Swelling Risk Factors: None identified. Limitations: Poor ultrasound/tissue interface and  patient immobility. Comparison Study: No prior studies. Performing Technologist: Oliver Hum RVT  Examination Guidelines: A complete evaluation includes B-mode imaging, spectral Doppler, color Doppler, and power Doppler as needed of all accessible portions of each vessel. Bilateral testing is considered an integral part of a complete examination. Limited examinations for reoccurring indications may be performed as noted.  Right Findings: +----------+------------+---------+-----------+----------+-------+  RIGHT      Compressible Phasicity Spontaneous Properties Summary  +----------+------------+---------+-----------+----------+-------+  IJV            Full        Yes        Yes                         +----------+------------+---------+-----------+----------+-------+  Subclavian     Full        Yes        Yes                         +----------+------------+---------+-----------+----------+-------+  Axillary       Full        Yes        Yes                         +----------+------------+---------+-----------+----------+-------+  Brachial       Full        Yes        Yes                         +----------+------------+---------+-----------+----------+-------+  Radial         Full                                               +----------+------------+---------+-----------+----------+-------+  Ulnar          Full                                               +----------+------------+---------+-----------+----------+-------+  Cephalic       Full                                               +----------+------------+---------+-----------+----------+-------+  Basilic        Full                                               +----------+------------+---------+-----------+----------+-------+  Left Findings: +----------+------------+---------+-----------+----------+-------+  LEFT       Compressible Phasicity Spontaneous Properties Summary  +----------+------------+---------+-----------+----------+-------+  Subclavian      Full        Yes        Yes                         +----------+------------+---------+-----------+----------+-------+  Summary:  Right: No evidence of deep vein thrombosis in the upper extremity. No evidence of superficial vein thrombosis in the upper extremity.  Left: No evidence of thrombosis in the subclavian.  *See table(s) above for measurements and observations.  Diagnosing physician: Ruta Hinds MD Electronically signed by Ruta Hinds MD on 10/22/2019 at 3:45:29 PM.    Final     Lab Data:  CBC: Recent Labs  Lab 10/25/19 0552 10/26/19 0326 10/27/19 0325 10/28/19 0400 10/29/19 0430 10/30/19 0420  WBC 6.2 7.1 8.7 9.2 9.9 9.9  NEUTROABS 4.8 5.6 7.1 7.7 8.4*  --   HGB 10.3* 11.3* 11.4* 11.4* 11.4* 11.5*  HCT 32.4* 34.7* 36.3* 35.7* 35.9* 35.7*  MCV 98.8 97.2 99.5 99.2 99.7 99.2  PLT 91* 112* 138* 150 183 503   Basic Metabolic Panel: Recent Labs  Lab 10/25/19 0552 10/26/19 0326 10/28/19 0400 10/29/19 0430 10/30/19 0420  NA 144 143 142 141 140  K 3.1* 3.2* 3.3* 3.7 3.2*  CL 112* 109 105 108 107  CO2 _0 GLUCOSE 104* 97 118* 98 130*  BUN 40* 35* 27* 25* 23  CREATININE 1.11 1.01 0.89 0.83 0.76  CALCIUM 7.8* 7.8* 7.9* 8.0* 7.7*  MG  --   --  1.9  --  1.8  PHOS  --   --   --   --  2.6   GFR: Estimated Creatinine Clearance: 65.3 mL/min (by C-G formula based on SCr of 0.76 mg/dL). Liver Function Tests: Recent Labs  Lab 10/24/19 0410 10/25/19 0552 10/26/19 0326 10/29/19 0430  AST 41 44* 46* 35  ALT _1 ALKPHOS 124 136* 165* 120  BILITOT 2.1* 2.7* 2.4* 2.0*  PROT 4.5* 4.4* 4.9* 5.0*  ALBUMIN 1.9* 2.1* 2.1* 2.3*   No results for input(s): LIPASE, AMYLASE in the last 168 hours. No results for input(s): AMMONIA in the last 168 hours. Coagulation Profile: Recent Labs  Lab 10/26/19 1844 10/27/19 0325 10/28/19 0400 10/29/19 0430 10/30/19 0420  INR 1.3* 1.3* 1.3* 1.6* 2.3*   Cardiac Enzymes: No results for input(s): CKTOTAL, CKMB,  CKMBINDEX, TROPONINI in the last 168 hours. BNP (last 3 results) No results for input(s): PROBNP in the last 8760 hours. HbA1C: No results for input(s): HGBA1C in the last 72 hours. CBG: Recent Labs  Lab 10/29/19 1211 10/29/19 1625 10/29/19 2033 10/29/19 2329 10/30/19 0335  GLUCAP 102* 104* 103* 114* 112*  Lipid Profile: No results for input(s): CHOL, HDL, LDLCALC, TRIG, CHOLHDL, LDLDIRECT in the last 72 hours. Thyroid Function Tests: No results for input(s): TSH, T4TOTAL, FREET4, T3FREE, THYROIDAB in the last 72 hours. Anemia Panel: No results for input(s): VITAMINB12, FOLATE, FERRITIN, TIBC, IRON, RETICCTPCT in the last 72 hours. Urine analysis:    Component Value Date/Time   COLORURINE AMBER (A) 10/19/2019 1519   APPEARANCEUR CLEAR 10/19/2019 1519   LABSPEC 1.040 (H) 10/19/2019 1519   PHURINE 5.0 10/19/2019 1519   GLUCOSEU NEGATIVE 10/19/2019 1519   HGBUR NEGATIVE 10/19/2019 1519   BILIRUBINUR NEGATIVE 10/19/2019 1519   KETONESUR 20 (A) 10/19/2019 1519   PROTEINUR 30 (A) 10/19/2019 1519   NITRITE NEGATIVE 10/19/2019 1519   LEUKOCYTESUR NEGATIVE 10/19/2019 1519     Kiyoto Slomski M.D. [facility] 10/30/2019, 10:49 AM   Call night coverage person covering after 7pm

## 2019-10-30 NOTE — Progress Notes (Signed)
Patient ID: [name] [name], male   DOB: Oct 28, 1932, 85 y.o.   MRN: [id number]    10 Days Post-Op  Subjective: More alert today, but still very weak.  Can phonate and speak to me for a short burst and then becomes so weak that he can only mouth words and is unable to vocalize.  Denies any abdominal pain  ROS: See above, otherwise other systems negative  Objective: Vital signs in last 24 hours: Temp:  [97.3 F (36.3 C)-98.1 F (36.7 C)] 97.6 F (36.4 C) (12/08 0800) Pulse Rate:  [69-89] 77 (12/08 0700) Resp:  [15-24] 17 (12/08 0700) BP: (81-149)/(53-81) 95/57 (12/08 0700) SpO2:  [86 %-100 %] 100 % (12/08 0700) Weight:  [74.2 kg] 74.2 kg (12/08 0443) Last BM Date: 10/28/19  Intake/Output from previous day: 12/07 0701 - 12/08 0700 In: 467.5 [I.V.:110; NG/GT:357.5] Out: 3150 [Urine:2700; Stool:450] Intake/Output this shift: Total I/O In: 476 [I.V.:46; NG/GT:430] Out: 450 [Urine:200; Stool:250]  PE: Abd: soft, nontender, midline wound is clean and packed.  Cortrak in place with TFs running.  Colostomy with flatus and liquid feculent output.  Lab Results:  Recent Labs    10/29/19 0430 10/30/19 0420  WBC 9.9 9.9  HGB 11.4* 11.5*  HCT 35.9* 35.7*  PLT 183 211   BMET Recent Labs    10/29/19 0430 10/30/19 0420  NA 141 140  K 3.7 3.2*  CL 108 107  CO2 25 25  GLUCOSE 98 130*  BUN 25* 23  CREATININE 0.83 0.76  CALCIUM 8.0* 7.7*   PT/INR Recent Labs    10/29/19 0430 10/30/19 0420  LABPROT 19.0* 25.1*  INR 1.6* 2.3*   CMP     Component Value Date/Time   NA 140 10/30/2019 0420   NA 143 09/10/2019 1042   K 3.2 (L) 10/30/2019 0420   CL 107 10/30/2019 0420   CO2 25 10/30/2019 0420   GLUCOSE 130 (H) 10/30/2019 0420   BUN 23 10/30/2019 0420   BUN 26 09/10/2019 1042   CREATININE 0.76 10/30/2019 0420   CREATININE 1.16 (H) 12/25/2015 1505   CALCIUM 7.7 (L) 10/30/2019 0420   PROT 5.0 (L) 10/29/2019 0430   ALBUMIN 2.3 (L) 10/29/2019 0430   AST 35 10/29/2019 0430   ALT 7 10/29/2019 0430   ALKPHOS 120 10/29/2019 0430   BILITOT 2.0 (H) 10/29/2019 0430   GFRNONAA >60 10/30/2019 0420   GFRAA >60 10/30/2019 0420   Lipase     Component Value Date/Time   LIPASE 20 10/19/2019 1400       Studies/Results: Dg Abd Portable 1v  Result Date: 10/29/2019 CLINICAL DATA:  Feeding tube placement. EXAM: PORTABLE ABDOMEN - 1 VIEW COMPARISON:  Radiograph dated 10/22/2019 FINDINGS: Feeding tube tip is in the body of the stomach. No dilated bowel loops. Bilateral pleural effusions and slight atelectasis at the left lung base. IMPRESSION: 1. Feeding tube tip is in the body of the stomach. 2. Bilateral pleural effusions and slight atelectasis at the left lung base. Electronically Signed   By: Francene Boyers M.D.   On: 10/29/2019 11:12    Anti-infectives: Anti-infectives (From admission, onward)   Start     Dose/Rate Route Frequency Ordered Stop   10/29/19 1600  fluconazole (DIFLUCAN) IVPB 100 mg     100 mg 50 mL/hr over 60 Minutes Intravenous  Once 10/29/19 1510 10/29/19 1750   10/19/19 2200  piperacillin-tazobactam (ZOSYN) IVPB 3.375 g  Status:  Discontinued     3.375 g 12.5 mL/hr over 240 Minutes Intravenous Every  8 hours 10/19/19 1802 10/27/19 0159   10/19/19 1700  vancomycin (VANCOCIN) 1,500 mg in sodium chloride 0.9 % 500 mL IVPB     1,500 mg 250 mL/hr over 120 Minutes Intravenous  Once 10/19/19 1636 10/19/19 1958   10/19/19 1630  ceFEPIme (MAXIPIME) 2 g in sodium chloride 0.9 % 100 mL IVPB     2 g 200 mL/hr over 30 Minutes Intravenous  Once 10/19/19 1629 10/19/19 1911       Assessment/Plan Parkinson's disease ([place]) Displaced intertrochanteric fracture of left femur, initial encounter for closed fracture (HCC) Chronic systolic CHF (congestive heart failure) ([place]) Dementia without behavioral disturbance (HCC) Sigmoid volvulus (HCC) Hypokalemia Hypotension RUE swelling-DVT studyneg Transaminitis- resolving VDRF- extubated 12/4  Dysphagia - exchange large NGT for small cortrak today for feeding.  Repeat swallow planned for today, but given patient's decrease in alertness, doubt he will pass for a diet.  POD10, S/PExploratory laparotomy, sigmoid colectomy, descending colostomy. Dr. Harlow Asa for Sigmoid Volvuluswith infarction, perforation, and feculent peritonitis -goodostomy output,TFs per protocol at this point -swallow eval cancelled yesterday due to lethargy.  More alert today.  Hopefully this can be done; however, i'm concerned with his overall weakness to vocalize nevermind eat and swallow well. - qshift wet-to-dry dressing changes to midline wound - Protonix twice daily -pulm toilet and IS -mobilize as able;PT/OT; anticipate will need SNF/LTAC.  FEN:TF, protonix  VTE: SCD's, lovenox,warfarin has been restarted AY:TKZSW 11/27-12/05,afebrile, WBCremains normal Foley:no Follow up:Dr.Gerkin   LOS: 11 days    Henreitta Cea , [facility] 10/30/2019, 8:58 AM Please see Amion for pager number during day hours 7:00am-4:30pm

## 2019-10-31 ENCOUNTER — Inpatient Hospital Stay (HOSPITAL_COMMUNITY): Payer: Medicare Other

## 2019-10-31 LAB — GLUCOSE, CAPILLARY
Glucose-Capillary: 101 mg/dL — ABNORMAL HIGH (ref 70–99)
Glucose-Capillary: 102 mg/dL — ABNORMAL HIGH (ref 70–99)
Glucose-Capillary: 62 mg/dL — ABNORMAL LOW (ref 70–99)
Glucose-Capillary: 77 mg/dL (ref 70–99)
Glucose-Capillary: 79 mg/dL (ref 70–99)
Glucose-Capillary: 92 mg/dL (ref 70–99)
Glucose-Capillary: 96 mg/dL (ref 70–99)

## 2019-10-31 LAB — BASIC METABOLIC PANEL
Anion gap: 8 (ref 5–15)
BUN: 25 mg/dL — ABNORMAL HIGH (ref 8–23)
CO2: 24 mmol/L (ref 22–32)
Calcium: 8.1 mg/dL — ABNORMAL LOW (ref 8.9–10.3)
Chloride: 106 mmol/L (ref 98–111)
Creatinine, Ser: 0.73 mg/dL (ref 0.61–1.24)
GFR calc Af Amer: >60 mL/min (ref >60)
GFR calc non Af Amer: >60 mL/min (ref >60)
Glucose, Bld: 119 mg/dL — ABNORMAL HIGH (ref 70–99)
Potassium: 3.7 mmol/L (ref 3.5–5.1)
Sodium: 138 mmol/L (ref 135–145)

## 2019-10-31 LAB — CBC
HCT: 36.6 % — ABNORMAL LOW (ref 39.0–52.0)
Hemoglobin: 11.7 g/dL — ABNORMAL LOW (ref 13.0–17.0)
MCH: 32.1 pg (ref 26.0–34.0)
MCHC: 32 g/dL (ref 30.0–36.0)
MCV: 100.5 fL — ABNORMAL HIGH (ref 80.0–100.0)
Platelets: 227 10*3/uL (ref 150–400)
RBC: 3.64 MIL/uL — ABNORMAL LOW (ref 4.22–5.81)
RDW: 17.1 % — ABNORMAL HIGH (ref 11.5–15.5)
WBC: 9.7 10*3/uL (ref 4.0–10.5)
nRBC: 0 % (ref 0.0–0.2)

## 2019-10-31 LAB — PROTIME-INR
INR: 2.6 — ABNORMAL HIGH (ref 0.8–1.2)
Prothrombin Time: 27.7 seconds — ABNORMAL HIGH (ref 11.4–15.2)

## 2019-10-31 MED ORDER — SODIUM CHLORIDE 0.9 % IV BOLUS
500.0000 mL | Freq: Once | INTRAVENOUS | Status: AC
  Administered 2019-10-31: 500 mL via INTRAVENOUS

## 2019-10-31 MED ORDER — AMANTADINE HCL 100 MG PO CAPS
100.0000 mg | ORAL_CAPSULE | Freq: Two times a day (BID) | ORAL | Status: DC
  Administered 2019-10-31 – 2019-11-04 (×8): 100 mg via ORAL
  Filled 2019-10-31 (×9): qty 1

## 2019-10-31 MED ORDER — QUETIAPINE 12.5 MG HALF TABLET
12.5000 mg | ORAL_TABLET | Freq: Once | ORAL | Status: AC
  Administered 2019-11-01: 12.5 mg via ORAL
  Filled 2019-10-31: qty 1

## 2019-10-31 MED ORDER — QUETIAPINE FUMARATE 50 MG PO TABS: 25.0000 mg | ORAL_TABLET | Freq: Once | ORAL | Status: DC

## 2019-10-31 MED ORDER — DEXTROSE 50 % IV SOLN: 12.5000 g | INTRAVENOUS | Status: AC

## 2019-10-31 MED ORDER — WARFARIN SODIUM 2.5 MG PO TABS
2.5000 mg | ORAL_TABLET | Freq: Once | ORAL | Status: AC
  Administered 2019-10-31: 18:00:00 2.5 mg via ORAL
  Filled 2019-10-31: qty 1

## 2019-10-31 MED ORDER — DEXTROSE 50 % IV SOLN
INTRAVENOUS | Status: AC
  Administered 2019-10-31: 12.5 g via INTRAVENOUS
  Filled 2019-10-31: qty 50

## 2019-10-31 NOTE — Progress Notes (Signed)
[place] for Warfarin Indication: MV angioplasty  Allergies  Allergen Reactions  . Apixaban Palpitations, Rash and Other (See Comments)    Worsening tardive dyskinesia and rapid heart rate  . Ace Inhibitors Cough  . Demerol [Meperidine] Other (See Comments)    Parkinsons disease  . Poison Ivy Extract Itching, Swelling and Rash   Patient Measurements: Height: 5' 8"  (172.7 cm) Weight: 163 lb 2.3 oz (74 kg) IBW/kg (Calculated) : 68.4  Vital Signs: Temp: 97.7 F (36.5 C) (12/09 0400) Temp Source: Axillary (12/09 0400) BP: 108/68 (12/09 0600) Pulse Rate: 79 (12/09 0600)  Labs: Recent Labs    10/29/19 0430 10/30/19 0420 10/31/19 0158  HGB 11.4* 11.5* 11.7*  HCT 35.9* 35.7* 36.6*  PLT 183 211 227  LABPROT 19.0* 25.1* 27.7*  INR 1.6* 2.3* 2.6*  CREATININE 0.83 0.76 0.73   Estimated Creatinine Clearance: 65.3 mL/min (by C-G formula based on SCr of 0.73 mg/dL).  Medical History: Past Medical History:  Diagnosis Date  . Dyslipidemia   . Parkinson's disease ([place])   . S/P mitral valve replacement    mitral regurg, endocarditis  . Stroke [facility])    Medications:  Scheduled:  . amantadine  100 mg Per Tube 2 times per day  . Carbidopa-Levodopa ER  1 capsule Per NG tube QHS  . Carbidopa-Levodopa ER  2 capsule Per NG tube Daily  . Carbidopa-Levodopa ER  3 capsule Per NG tube 2 times per day  . chlorhexidine gluconate (MEDLINE KIT)  15 mL Mouth Rinse BID  . Chlorhexidine Gluconate Cloth  6 each Topical Daily  . donepezil  10 mg Per Tube QHS  . escitalopram  10 mg Per Tube Daily  . feeding supplement (PRO-STAT SUGAR FREE 64)  30 mL Oral BID  . free water  200 mL Per Tube Q6H  . mouth rinse  15 mL Mouth Rinse 10 times per day  . pantoprazole (PROTONIX) IV  40 mg Intravenous Daily  . rotigotine  1 patch Transdermal Daily  . sodium chloride flush  10-40 mL Intracatheter Q12H  . Warfarin - Pharmacist Dosing Inpatient   Does not apply q1800    Assessment:  16 yoM with sigmoid volvulus perforation > feculent peritonitis, 11/27 sigmoidoscopy, 11/28 colectomy with colostomy.  Intubated 11/28 >> 12/4 extubated, plan resume Warfarin once passes swallow study. INR goal 2-3 per Coumadin clinic notes. PTA Warfarin 2.21m daily, LD 11/26   Today, 10/31/2019  Did not pass swallow study, but tolerating meds via tube.  Repeat swallow study on 12/8 recommends medication administration to be crushed with puree  INR is 2.6, therapeutic, H/H stable, Plt now WNL    Goal of Therapy:  INR 2-3 Monitor platelets by anticoagulation protocol: Yes   Plan:   Warfarin 2.537mvia tube x1 today at 1800  Daily Protime/INR   Monitor for signs and symptoms of bleeding    NiRoyetta AsalPharmD, BCPS 10/31/2019 7:41 AM

## 2019-10-31 NOTE — Progress Notes (Signed)
Modified Barium Swallow Progress Note  Patient Details  Name: [name] [name] MRN: [id number] Date of Birth: 05/14/1932  Today's Date: 10/31/2019  Modified Barium Swallow completed.  Full report located under Chart Review in the Imaging Section.  Brief recommendations include the following:  Clinical Impression  Pt presents with mild oropharyngeal dysphagia without aspiration of any consistency tested.  He does demonstrates decrreased initiation/coordination of swallow contributing to residuals.  After first few boluses of barium, pt's swallow became more efficient (due to his Parkinson's).  Severe pharyngeal retention noted intially with tsp nectar and puree, and various postures including head turn left and chin tuck decreased accumulation.  Pharyngeal retention was significantly abated after initiation of few swallows with head neutral and cued effortful swallow. In addition, liquid aided pharyngeal clearance of increased visocity boluses retained in vallecular region.  Due to pt's weakness/deconditioning, h/o coughing with liquids at home recommend to start a conservative diet of dys3/nectar.    Recommend pt consume thin water between meals after mouth care. Anticipate diet modifier will only need to be temporary and as pt medical improves, advancement appropriate. Using teach back, educated pt to findings/recomemndations/compensation.  Of note upon esophageal sweep, pt appeared with minimal residuals distally - without sensation.    Swallow Evaluation Recommendations       SLP Diet Recommendations: Dysphagia 3 (Mech soft) solids;Nectar thick liquid(thin water between meals)   Liquid Administration via: Cup;Straw   Medication Administration: Whole meds with puree(start and follow with liquids)   Supervision: Full supervision/cueing for compensatory strategies;Staff to assist with self feeding   Compensations: Slow rate;Small sips/bites;Follow solids with liquid(assure pt swallows before  giving more, oral suction prn)   Postural Changes: Remain semi-upright after after feeds/meals (Comment)   Oral Care Recommendations: Oral care BID     Luanna Salk, MS [facility] SLP Acute Rehab Services Pager [phone] Office [phone]   [name] 10/31/2019,10:09 AM

## 2019-10-31 NOTE — Progress Notes (Signed)
  Speech Language Pathology Treatment: Dysphagia  Patient Details Name: [name] [name] MRN: [id number] DOB: 06-Apr-1932 Today's Date: 10/31/2019 Time: 8502-7741 SLP Time Calculation (min) (ACUTE ONLY): 15 min  Assessment / Plan / Recommendation Clinical Impression  Pt's wife was shown MBS on a work station on wheels - revewing detailed flouro loops.  She remarked at her joy in pt being able to consume po intake.  Admits pt would cough when drinking water at home prior to admission.  She also states he would fall alseep during dinner and took mid-day naps.  Given chronicity of pt's Parkinson's and respiratory failure with decreased baseline function - palliative referral recommended to establish goals and possibly decrease recurrent hospital admissions.  Pt did awaken at end of session and SLP obtained help to slide pt up in bed to allow his wife to feed him his meal that had just arrived.  Pt had been lethargic during entire session until this point and SLP implored wife to NOT feed pt if he is lethargic due to his weakness/prolonged intubation and aspiration risk.  Pt's wife taught back main points of precautions and asked appropriate questions during the session reading swallow precaution signs.    Mentation will continue to impact pt's ability to consume po - and will need to be monitored.  Pt is making significant progress fortunately.    HPI HPI: 85 y.o. male with medical history significant of Parkinson's disease, mitral valve repair with history of endocarditis and mitral valve angioplasty in 2008, paroxysmal atrial fibrillation on Coumadin (previously on Eliquis but had inflammatory reaction), history of TIA, hyperlipidemia  was brought into the ED with complaint of abdominal pain.  History provided by wife who is at the bedside since patient is completely lethargic.  According to wife, he started having abdominal pain around 3 in the morning.  No further information about abdominal pain.  According  to her, his last bowel movement was sometime yesterday but small amount of BM he passed this morning.  No history of nausea, vomiting, fever, chills, chest pain, shortness of breath or any other complaint..  Pt is s/p surgical intervention sigmoid colectomy, descending colostomy. Dr. Harlow Asa for Sigmoid Volvulus with infarction, perforation, and feculent peritonitis.  Pt has been seen by SLP for clinical swallow evaluation on 12/5/ coughing with thin liquids prior to intake per notes.  Has have small bore feeding tube and today underwent mbs. Session indicated to review testing results and compensation with pt's wife as she feeds the pt and was unable to present for MBS.      SLP Plan  Continue with current plan of care(10/31/2019)       Recommendations  Diet recommendations: Dysphagia 3 (mechanical soft);Nectar-thick liquid Liquids provided via: Cup;Straw Medication Administration: Crushed with puree Supervision: Staff to assist with self feeding Compensations: Slow rate;Small sips/bites;Follow solids with liquid Postural Changes and/or Swallow Maneuvers: Seated upright 90 degrees;Upright 30-60 min after meal                Oral Care Recommendations: Oral care QID Follow up Recommendations: 24 hour supervision/assistance;Other (comment)(or ) SLP Visit Diagnosis: Dysphagia, oropharyngeal phase (R13.12);Dysphagia, pharyngoesophageal phase (R13.14) Plan: Continue with current plan of care(10/31/2019)       GO                [name] 10/31/2019, 1:55 PM  Luanna Salk, [facility] SLP Acute Rehab Services Pager [phone] Office [phone]

## 2019-10-31 NOTE — Progress Notes (Addendum)
[name] [name] APP - Progress Note  [name] [name] YZJ:[id number] DOB: 05-06-1932 DOA: 10/19/2019 PCP: [name] Infante, MD   Brief narrative: Patient is a 85 year old with multiple medical problems including Parkinson's disease, mitral valve repair with history of endocarditis and mitral valve angioplasty in 2008, paroxysmal A. fib on Coumadin (previously on Eliquis but had inflammatory reaction), prior history of TIA, hyperlipidemia was admitted with abdominal pain, sigmoid volvulus.  He was seen by surgery and GI.  Patient underwent underwent sigmoidoscopy on 11/27 for decompression. On 11/28 become increasingly unstable with elevated lactic acid and shock.  Failed decompression and went to the OR for ex lap, sigmoid colectomy, colostomy creation, was intubated and admitted to ICU.  Patient was under critical care service, transfer to hospitalist service on 10/30/2019.  Significant hospital events 11/27- Admit, underwent flex sig for decompression 11/28- Worsening shock and lactic acidosis. Failed decompression and went to OR for ex-lap, sigmoid colectomy and colostomy creation. Returned to ICU vented 11/29 - Weaning vent settings 12/2 off pressors for > 24 hours. Weaning but not strong enough or awake enough to come off vent. Advancing tube feeds. IV diuresis for third spacing. Still over 13 liters positive. 12/3: Still too weak to extubate 12/4: Extubated 12/8: TRH assumes care    Subjective: No complaints- reports is "OK".  Nursing stated patient pulled out NG tube overnight.  Assessment/Plan: Principal Problem:   Acute respiratory failure with hypoxia (HCC) Active Problems:   Parkinson's disease ([place])   Displaced intertrochanteric fracture of left femur, initial encounter for closed fracture ([place])   Chronic systolic CHF (congestive heart failure) (HCC)   Dementia without behavioral disturbance (HCC)   Volvulus of colon (HCC)   Hypokalemia   Hypotension   Pleural effusion   Principal problem Sigmoid volvulus with perforation and peritonitis, status post sigmoid colectomy, colostomy creation  -Patient presented with sigmoid volvulus, failed flex sig w/ decompression- underwent sigmoid colectomy/colostomy on 11/28-found to have infarction with perforation and feculent peritonitis -General surgery following closely, recommended wet-to-dry dressing every shift -Continue PPI, pulmonary toilet and IS, mobilize -IV Zosyn completed on 12/4  Mild oral pharyngeal dysphagia -Did not pass swallow evaluation on 12/6.  At baseline, on thickened liquids - Underwent swallowing evaluation on 12/8 with modified barium swallow study recommended.  This is been completed as of 12/9 with recommendation for dysphagia 3/mechanical soft solids with nectar thick liquids and thin water between meals.  Patient is allowed to utilize straw and can take home meds with pure  Acute respiratory failure with hypoxia, small pleural effusion status post mechanical ventilation -Patient was extubated on 12/5 -Continue aspiration and reflux precautions -Received Lasix on 12/7, however BP soft, will not tolerate any diuresis -Mobilize, pulmonary toilet, incentive spirometry -O2 sats 100% on room air  Paroxysmal A. fib with RVR/history of mitral valve repair -Currently heart rate controlled, SBP in 80s on 12/8 therefore metoprolol held -Continue Coumadin -INR 2.6 -Systolic mitral murmur on exam  Acute metabolic encephalopathy with history of Parkinson's disease, dementia -Continue amantadine, carbidopa/levodopa, Aricept -Per spouse, does not tolerate benzodiazepines well -Continue delirium prevention interventions, mobilization  Hypokalemia Replaced -potassium 3.7 on 2/9  Severe protein calorie malnutrition, severe physical deconditioning -See above regarding initiation of D3 diet -PT OT evaluation, will likely need SNF   DVT prophylaxis: Warfarin  Code Status:  DNR  Family  Communication:  Updated wife regarding advancement of diet, need for PT/OT evaluation prior to discharge and transfer out of stepdown unit  Disposition Plan/Expected LOS: Anticipate  eventual DC to SNF  Consultants: GI PCCM General surgery  Procedures: 11/28 - sigmoid colectomy with colostomy.  Intubation and extubation   Cultures: MRSA PCR negative Urine culture negative Blood cultures negative  Antibiotics: Anti-infectives (From admission, onward)   Start     Dose/Rate Route Frequency Ordered Stop   10/29/19 1600  fluconazole (DIFLUCAN) IVPB 100 mg     100 mg 50 mL/hr over 60 Minutes Intravenous  Once 10/29/19 1510 10/29/19 1750   10/19/19 2200  piperacillin-tazobactam (ZOSYN) IVPB 3.375 g  Status:  Discontinued     3.375 g 12.5 mL/hr over 240 Minutes Intravenous Every 8 hours 10/19/19 1802 10/27/19 0159   10/19/19 1700  vancomycin (VANCOCIN) 1,500 mg in sodium chloride 0.9 % 500 mL IVPB     1,500 mg 250 mL/hr over 120 Minutes Intravenous  Once 10/19/19 1636 10/19/19 1958   10/19/19 1630  ceFEPIme (MAXIPIME) 2 g in sodium chloride 0.9 % 100 mL IVPB     2 g 200 mL/hr over 30 Minutes Intravenous  Once 10/19/19 1629 10/19/19 1911      Objective: Blood pressure 108/68, pulse 79, temperature (!) 97.3 F (36.3 C), temperature source Oral, resp. rate 18, height 5' 8"  (1.727 m), weight 74 kg, SpO2 100 %.  Intake/Output Summary (Last 24 hours) at 10/31/2019 1009 Last data filed at 10/31/2019 0943 Gross per 24 hour  Intake 1446.36 ml  Output 500 ml  Net 946.36 ml   Estimated body mass index is 24.81 kg/m as calculated from the following:   Height as of this encounter: 5' 8"  (1.727 m).   Weight as of this encounter: 74 kg.   Malnutrition Type:  Nutrition Problem: Increased nutrient needs Etiology: acute illness   Malnutrition Characteristics:  Signs/Symptoms: estimated needs   Nutrition Interventions:  Interventions: Prostat, Tube feeding-as of 12/9 advancing  to D3 diet with nectar thick liquids    Exam: Gen: No acute respiratory distress Psych: Awake, oriented x name/place Chest: Clear to auscultation bilaterally without wheezes, rhonchi or crackles, room air Cardiac: Irregular rate/atrial fibrillation rhythm, no rubs; loud pan systolic murmur LSB, 4th ICS-no gallops, no peripheral edema, no JVD Abdomen: Soft nontender nondistended without obvious hepatosplenomegaly, no ascites, surgical incision covered by clean dry dressing, left lower quadrant with ostomy and notable liquid stool in bag-stoma pink and nonedematous. GU: Foley catheter with amber colored urine Extremities: Symmetrical in appearance without cyanosis, clubbing or effusion  Scheduled Meds:  Scheduled Meds: . amantadine  100 mg Per Tube 2 times per day  . Carbidopa-Levodopa ER  1 capsule Per NG tube QHS  . Carbidopa-Levodopa ER  2 capsule Per NG tube Daily  . Carbidopa-Levodopa ER  3 capsule Per NG tube 2 times per day  . chlorhexidine gluconate (MEDLINE KIT)  15 mL Mouth Rinse BID  . Chlorhexidine Gluconate Cloth  6 each Topical Daily  . donepezil  10 mg Per Tube QHS  . escitalopram  10 mg Per Tube Daily  . feeding supplement (PRO-STAT SUGAR FREE 64)  30 mL Oral BID  . free water  200 mL Per Tube Q6H  . mouth rinse  15 mL Mouth Rinse 10 times per day  . pantoprazole (PROTONIX) IV  40 mg Intravenous Daily  . rotigotine  1 patch Transdermal Daily  . sodium chloride flush  10-40 mL Intracatheter Q12H  . warfarin  2.5 mg Oral ONCE-1800  . Warfarin - Pharmacist Dosing Inpatient   Does not apply q1800   Continuous Infusions: . dextrose 5 %  and 0.45% NaCl 30 mL/hr at 10/31/19 0600  . feeding supplement (OSMOLITE 1.5 CAL) Stopped (10/30/19 1650)    Data Reviewed: Basic Metabolic Panel: Recent Labs  Lab 10/26/19 0326 10/28/19 0400 10/29/19 0430 10/30/19 0420 10/31/19 0158  NA 143 142 141 140 138  K 3.2* 3.3* 3.7 3.2* 3.7  CL 109 105 108 107 106  CO2 23 27 25 25 24    GLUCOSE 97 118* 98 130* 119*  BUN 35* 27* 25* 23 25*  CREATININE 1.01 0.89 0.83 0.76 0.73  CALCIUM 7.8* 7.9* 8.0* 7.7* 8.1*  MG  --  1.9  --  1.8  --   PHOS  --   --   --  2.6  --    Liver Function Tests: Recent Labs  Lab 10/25/19 0552 10/26/19 0326 10/29/19 0430  AST 44* 46* 35  ALT 8 9 7   ALKPHOS 136* 165* 120  BILITOT 2.7* 2.4* 2.0*  PROT 4.4* 4.9* 5.0*  ALBUMIN 2.1* 2.1* 2.3*   No results for input(s): LIPASE, AMYLASE in the last 168 hours. No results for input(s): AMMONIA in the last 168 hours. CBC: Recent Labs  Lab 10/25/19 0552 10/26/19 0326 10/27/19 0325 10/28/19 0400 10/29/19 0430 10/30/19 0420 10/31/19 0158  WBC 6.2 7.1 8.7 9.2 9.9 9.9 9.7  NEUTROABS 4.8 5.6 7.1 7.7 8.4*  --   --   HGB 10.3* 11.3* 11.4* 11.4* 11.4* 11.5* 11.7*  HCT 32.4* 34.7* 36.3* 35.7* 35.9* 35.7* 36.6*  MCV 98.8 97.2 99.5 99.2 99.7 99.2 100.5*  PLT 91* 112* 138* 150 183 211 227   Cardiac Enzymes: No results for input(s): CKTOTAL, CKMB, CKMBINDEX, TROPONINI in the last 168 hours. BNP (last 3 results) Recent Labs    08/24/19 1206  BNP 670.1*    ProBNP (last 3 results) No results for input(s): PROBNP in the last 8760 hours.  CBG: Recent Labs  Lab 10/30/19 1937 10/30/19 2102 10/30/19 2353 10/31/19 0403 10/31/19 0816  GLUCAP 84 118* 102* 101* 92    No results found for this or any previous visit (from the past 240 hour(s)).     Studies:  Recent x-ray studies have been reviewed in detail by the Attending Physician  Time spent :      Erin Hearing, [place] [facility] Office  [phone] Pager [phone]   **If unable to reach the above provider after paging please contact the [place] @ [phone]  On-Call/Text Page:      [email or web address]      password [email or web address]  If 7PM-7AM, please contact night-coverage [email or web address] Password [email or web address] 10/31/2019, 10:09 AM   LOS: 12 days

## 2019-10-31 NOTE — Progress Notes (Addendum)
Patient ID: [name] [name], male   DOB: 02-06-1932, 85 y.o.   MRN: [id number]    11 Days Post-Op  Subjective: Patient awake today but very weak and frail appearing.  Apparently he pulled his Cortrak out yesterday.  Having a blot of stool in his colostomy bag.  ROS: See above, otherwise other systems negative  Objective: Vital signs in last 24 hours: Temp:  [97.3 F (36.3 C)-98.1 F (36.7 C)] 97.7 F (36.5 C) (12/09 0400) Pulse Rate:  [68-94] 79 (12/09 0600) Resp:  [14-20] 18 (12/09 0600) BP: (83-119)/(50-88) 108/68 (12/09 0600) SpO2:  [99 %-100 %] 100 % (12/09 0600) Weight:  [74 kg] 74 kg (12/09 0500) Last BM Date: 10/31/19  Intake/Output from previous day: 12/08 0701 - 12/09 0700 In: 2342.4 [P.O.:20; I.V.:430.7; NG/GT:1891.6] Out: 950 [Urine:650; Stool:300] Intake/Output this shift: No intake/output data recorded.  PE: Gen: frail and weak appearing elderly mail Abd: soft, midline wound is clean and fascia intact, packed.  Colostomy with pink stoma with liquid stool present.  +BS, ND, appropriately tender  Lab Results:  Recent Labs    10/30/19 0420 10/31/19 0158  WBC 9.9 9.7  HGB 11.5* 11.7*  HCT 35.7* 36.6*  PLT 211 227   BMET Recent Labs    10/30/19 0420 10/31/19 0158  NA 140 138  K 3.2* 3.7  CL 107 106  CO2 25 24  GLUCOSE 130* 119*  BUN 23 25*  CREATININE 0.76 0.73  CALCIUM 7.7* 8.1*   PT/INR Recent Labs    10/30/19 0420 10/31/19 0158  LABPROT 25.1* 27.7*  INR 2.3* 2.6*   CMP     Component Value Date/Time   NA 138 10/31/2019 0158   NA 143 09/10/2019 1042   K 3.7 10/31/2019 0158   CL 106 10/31/2019 0158   CO2 24 10/31/2019 0158   GLUCOSE 119 (H) 10/31/2019 0158   BUN 25 (H) 10/31/2019 0158   BUN 26 09/10/2019 1042   CREATININE 0.73 10/31/2019 0158   CREATININE 1.16 (H) 12/25/2015 1505   CALCIUM 8.1 (L) 10/31/2019 0158   PROT 5.0 (L) 10/29/2019 0430   ALBUMIN 2.3 (L) 10/29/2019 0430   AST 35 10/29/2019 0430   ALT 7 10/29/2019 0430   ALKPHOS 120 10/29/2019 0430   BILITOT 2.0 (H) 10/29/2019 0430   GFRNONAA >60 10/31/2019 0158   GFRAA >60 10/31/2019 0158   Lipase     Component Value Date/Time   LIPASE 20 10/19/2019 1400       Studies/Results: Dg Abd Portable 1v  Result Date: 10/29/2019 CLINICAL DATA:  Feeding tube placement. EXAM: PORTABLE ABDOMEN - 1 VIEW COMPARISON:  Radiograph dated 10/22/2019 FINDINGS: Feeding tube tip is in the body of the stomach. No dilated bowel loops. Bilateral pleural effusions and slight atelectasis at the left lung base. IMPRESSION: 1. Feeding tube tip is in the body of the stomach. 2. Bilateral pleural effusions and slight atelectasis at the left lung base. Electronically Signed   By: Lorriane Shire M.D.   On: 10/29/2019 11:12    Anti-infectives: Anti-infectives (From admission, onward)   Start     Dose/Rate Route Frequency Ordered Stop   10/29/19 1600  fluconazole (DIFLUCAN) IVPB 100 mg     100 mg 50 mL/hr over 60 Minutes Intravenous  Once 10/29/19 1510 10/29/19 1750   10/19/19 2200  piperacillin-tazobactam (ZOSYN) IVPB 3.375 g  Status:  Discontinued     3.375 g 12.5 mL/hr over 240 Minutes Intravenous Every 8 hours 10/19/19 1802 10/27/19 0159   10/19/19 1700  vancomycin (VANCOCIN) 1,500 mg in sodium chloride 0.9 % 500 mL IVPB     1,500 mg 250 mL/hr over 120 Minutes Intravenous  Once 10/19/19 1636 10/19/19 1958   10/19/19 1630  ceFEPIme (MAXIPIME) 2 g in sodium chloride 0.9 % 100 mL IVPB     2 g 200 mL/hr over 30 Minutes Intravenous  Once 10/19/19 1629 10/19/19 1911       Assessment/Plan Parkinson's disease (HCC) Displaced intertrochanteric fracture of left femur, initial encounter for closed fracture (HCC) Chronic systolic CHF (congestive heart failure) (HCC) Dementia without behavioral disturbance (HCC) Sigmoid volvulus (HCC) Hypokalemia Hypotension RUE swelling-DVT studyneg Transaminitis-resolving VDRF- extubated 12/4 Dysphagia- MBS today, may  need Cortrak replaced if unable to pass  POD11, S/PExploratory laparotomy, sigmoid colectomy, descending colostomy. Dr. Gerrit Friends for Sigmoid Volvuluswith infarction, perforation, and feculent peritonitis -goodostomy output -pulled Cortrak out himself yesterday.  Will not replace until after MBS today if he fails.  Hopefully he will pass, but i'm skeptical of how much oral nutrition he is going to take in given how weak he is. - qshift wet-to-dry dressing changes to midline wound - Protonix daily -pulm toilet and IS -mobilize as able;PT/OT; anticipate will need SNF/LTAC.  FEN:TFs on hold as he pulled out his Cortrak, MBS today VTE: SCD's, lovenox,warfarin has been restarted WN:IOEVO 11/27-12/05,afebrile, WBCremains normal Foley:no Follow up:Dr.Gerkin   LOS: 12 days    Letha Cape , [facility] Surgery 10/31/2019, 8:53 AM Please see Amion for pager number during day hours 7:00am-4:30pm

## 2019-11-01 LAB — BASIC METABOLIC PANEL
Anion gap: 7 (ref 5–15)
BUN: 21 mg/dL (ref 8–23)
CO2: 24 mmol/L (ref 22–32)
Calcium: 8 mg/dL — ABNORMAL LOW (ref 8.9–10.3)
Chloride: 109 mmol/L (ref 98–111)
Creatinine, Ser: 0.64 mg/dL (ref 0.61–1.24)
GFR calc Af Amer: >60 mL/min (ref >60)
GFR calc non Af Amer: >60 mL/min (ref >60)
Glucose, Bld: 88 mg/dL (ref 70–99)
Potassium: 3.2 mmol/L — ABNORMAL LOW (ref 3.5–5.1)
Sodium: 140 mmol/L (ref 135–145)

## 2019-11-01 LAB — GLUCOSE, CAPILLARY
Glucose-Capillary: 84 mg/dL (ref 70–99)
Glucose-Capillary: 45 mg/dL — ABNORMAL LOW (ref 70–99)
Glucose-Capillary: 84 mg/dL (ref 70–99)
Glucose-Capillary: 70 mg/dL (ref 70–99)
Glucose-Capillary: 91 mg/dL (ref 70–99)
Glucose-Capillary: 91 mg/dL (ref 70–99)

## 2019-11-01 LAB — CBC
HCT: 34.3 % — ABNORMAL LOW (ref 39.0–52.0)
Hemoglobin: 11 g/dL — ABNORMAL LOW (ref 13.0–17.0)
MCH: 32 pg (ref 26.0–34.0)
MCHC: 32.1 g/dL (ref 30.0–36.0)
MCV: 99.7 fL (ref 80.0–100.0)
Platelets: 223 10*3/uL (ref 150–400)
RBC: 3.44 MIL/uL — ABNORMAL LOW (ref 4.22–5.81)
RDW: 16.9 % — ABNORMAL HIGH (ref 11.5–15.5)
WBC: 7.5 10*3/uL (ref 4.0–10.5)
nRBC: 0 % (ref 0.0–0.2)

## 2019-11-01 LAB — PROTIME-INR
INR: 3.2 — ABNORMAL HIGH (ref 0.8–1.2)
Prothrombin Time: 32.3 seconds — ABNORMAL HIGH (ref 11.4–15.2)

## 2019-11-01 MED ORDER — ESCITALOPRAM OXALATE 20 MG PO TABS
10.0000 mg | ORAL_TABLET | Freq: Every day | ORAL | Status: DC
  Administered 2019-11-02 – 2019-11-04 (×3): 10 mg via ORAL
  Filled 2019-11-01 (×3): qty 1

## 2019-11-01 MED ORDER — MIDODRINE HCL 5 MG PO TABS
2.5000 mg | ORAL_TABLET | Freq: Three times a day (TID) | ORAL | Status: DC
  Administered 2019-11-01 – 2019-11-04 (×10): 2.5 mg via ORAL
  Filled 2019-11-01 (×9): qty 1

## 2019-11-01 MED ORDER — DEXTROSE 50 % IV SOLN
1.0000 | Freq: Once | INTRAVENOUS | Status: AC
  Administered 2019-11-01: 50 mL via INTRAVENOUS
  Filled 2019-11-01: qty 50

## 2019-11-01 MED ORDER — PANTOPRAZOLE SODIUM 40 MG PO TBEC
40.0000 mg | DELAYED_RELEASE_TABLET | Freq: Every day | ORAL | Status: DC
  Administered 2019-11-02 – 2019-11-03 (×2): 40 mg via ORAL
  Filled 2019-11-01 (×3): qty 1

## 2019-11-01 MED ORDER — DONEPEZIL HCL 10 MG PO TABS
10.0000 mg | ORAL_TABLET | Freq: Every day | ORAL | Status: DC
  Administered 2019-11-01 – 2019-11-03 (×3): 10 mg via ORAL
  Filled 2019-11-01 (×3): qty 1

## 2019-11-01 MED ORDER — DEXTROSE 50 % IV SOLN
INTRAVENOUS | Status: AC
  Filled 2019-11-01: qty 50

## 2019-11-01 MED ORDER — BOOST / RESOURCE BREEZE PO LIQD CUSTOM
1.0000 | Freq: Three times a day (TID) | ORAL | Status: DC
  Administered 2019-11-01 – 2019-11-04 (×8): 1 via ORAL

## 2019-11-01 MED ORDER — GLUCOSE 40 % PO GEL
2.0000 | ORAL | Status: AC
  Administered 2019-11-01: 75 g via ORAL
  Filled 2019-11-01: qty 2

## 2019-11-01 MED ORDER — WARFARIN SODIUM 1 MG PO TABS
1.0000 mg | ORAL_TABLET | Freq: Once | ORAL | Status: AC
  Administered 2019-11-01: 1 mg via ORAL
  Filled 2019-11-01: qty 1

## 2019-11-01 MED ORDER — CARBIDOPA-LEVODOPA ER 48.75-195 MG PO CPCR
3.0000 | ORAL_CAPSULE | ORAL | Status: DC
  Administered 2019-11-01 – 2019-11-04 (×6): 3 via ORAL

## 2019-11-01 MED ORDER — CARBIDOPA-LEVODOPA ER 48.75-195 MG PO CPCR
2.0000 | ORAL_CAPSULE | ORAL | Status: DC
  Administered 2019-11-01 – 2019-11-03 (×3): 2 via ORAL

## 2019-11-01 MED ORDER — DEXTROSE 50 % IV SOLN
25.0000 g | INTRAVENOUS | Status: AC
  Administered 2019-11-01: 25 g via INTRAVENOUS

## 2019-11-01 MED ORDER — CARBIDOPA-LEVODOPA ER 48.75-195 MG PO CPCR
1.0000 | ORAL_CAPSULE | Freq: Every day | ORAL | Status: DC
  Administered 2019-11-01 – 2019-11-03 (×3): 1 via ORAL

## 2019-11-01 NOTE — Progress Notes (Signed)
[place] for Warfarin Indication: MV angioplasty  Allergies  Allergen Reactions  . Apixaban Palpitations, Rash and Other (See Comments)    Worsening tardive dyskinesia and rapid heart rate  . Ace Inhibitors Cough  . Demerol [Meperidine] Other (See Comments)    Parkinsons disease  . Poison Ivy Extract Itching, Swelling and Rash   Patient Measurements: Height: 5\' 8"  (172.7 cm) Weight: 160 lb 0.9 oz (72.6 kg) IBW/kg (Calculated) : 68.4  Vital Signs: Temp: 97.8 F (36.6 C) (12/10 0800) Temp Source: Axillary (12/10 0800) BP: 96/57 (12/10 1101) Pulse Rate: 75 (12/10 1101)  Labs: Recent Labs    10/30/19 0420 10/31/19 0158 11/01/19 0220  HGB 11.5* 11.7* 11.0*  HCT 35.7* 36.6* 34.3*  PLT 211 227 223  LABPROT 25.1* 27.7* 32.3*  INR 2.3* 2.6* 3.2*  CREATININE 0.76 0.73 0.64   Estimated Creatinine Clearance: 65.3 mL/min (by C-G formula based on SCr of 0.64 mg/dL).  Assessment:  89 yoM with sigmoid volvulus perforation > feculent peritonitis, 11/27 sigmoidoscopy, 11/28 colectomy with colostomy.  Intubated 11/28 >> 12/4 extubated, plan resume Warfarin once passes swallow study. INR goal 2-3 per Coumadin clinic notes. PTA Warfarin 2.5mg  daily, LD 11/26   Today, 11/01/2019  INR is 3.2 slightly supra-therapeutic, 5.3 sec jump in protime  H/H stable, Plt now WNL    No bleeding reported  Goal of Therapy:  INR 2-3 Monitor platelets by anticoagulation protocol: Yes   Plan:   Warfarin 1 mg po x1 today at 1800  Daily Protime/INR  Monitor for signs and symptoms of bleeding Change  IV PPI> PO  Eudelia Bunch, Pharm.D [phone] 11/01/2019 12:06 PM

## 2019-11-01 NOTE — Progress Notes (Signed)
Patient ID: [name] [name], male   DOB: Apr 19, 1932, 85 y.o.   MRN: [id number]    12 Days Post-Op  Subjective: Patient sleeping.  Awakens and opens eyes to command but not spontaneously.  Knows he is at [facility] but thinks it is 31.  Ate a small amount of breakfast.  ROS: See above, otherwise other systems negative  Objective: Vital signs in last 24 hours: Temp:  [97 F (36.1 C)-98.6 F (37 C)] 97.8 F (36.6 C) (12/10 0800) Pulse Rate:  [71-87] 72 (12/10 0810) Resp:  [16-29] 28 (12/10 0810) BP: (75-142)/(47-109) 80/59 (12/10 0810) SpO2:  [93 %-100 %] 93 % (12/10 0810) Weight:  [72.6 kg] 72.6 kg (12/10 0500) Last BM Date: 10/31/19  Intake/Output from previous day: 12/09 0701 - 12/10 0700 In: 1662.9 [P.O.:118; I.V.:1044.9; IV Piggyback:500] Out: 500 [Urine:500] Intake/Output this shift: No intake/output data recorded.  PE: Abd: soft, appropriately tender, ostomy working well, stoma is pink and viable, midline wound is clean and packed.    Lab Results:  Recent Labs    10/31/19 0158 11/01/19 0220  WBC 9.7 7.5  HGB 11.7* 11.0*  HCT 36.6* 34.3*  PLT 227 223   BMET Recent Labs    10/31/19 0158 11/01/19 0220  NA 138 140  K 3.7 3.2*  CL 106 109  CO2 24 24  GLUCOSE 119* 88  BUN 25* 21  CREATININE 0.73 0.64  CALCIUM 8.1* 8.0*   PT/INR Recent Labs    10/31/19 0158 11/01/19 0220  LABPROT 27.7* 32.3*  INR 2.6* 3.2*   CMP     Component Value Date/Time   NA 140 11/01/2019 0220   NA 143 09/10/2019 1042   K 3.2 (L) 11/01/2019 0220   CL 109 11/01/2019 0220   CO2 24 11/01/2019 0220   GLUCOSE 88 11/01/2019 0220   BUN 21 11/01/2019 0220   BUN 26 09/10/2019 1042   CREATININE 0.64 11/01/2019 0220   CREATININE 1.16 (H) 12/25/2015 1505   CALCIUM 8.0 (L) 11/01/2019 0220   PROT 5.0 (L) 10/29/2019 0430   ALBUMIN 2.3 (L) 10/29/2019 0430   AST 35 10/29/2019 0430   ALT 7 10/29/2019 0430   ALKPHOS 120 10/29/2019 0430   BILITOT 2.0 (H) 10/29/2019 0430   GFRNONAA >60 11/01/2019  0220   GFRAA >60 11/01/2019 0220   Lipase     Component Value Date/Time   LIPASE 20 10/19/2019 1400       Studies/Results: DG Swallowing Func-Speech Pathology  Result Date: 10/31/2019 Objective Swallowing Evaluation: Type of Study: Bedside Swallow Evaluation  Patient Details Name: [name] [name] MRN: [id number] Date of Birth: October 23, 1932 Today's Date: 10/31/2019 Time: SLP Start Time (ACUTE ONLY): 0905 -SLP Stop Time (ACUTE ONLY): 0935 SLP Time Calculation (min) (ACUTE ONLY): 30 min Past Medical History: Past Medical History: Diagnosis Date  Dyslipidemia   Parkinson's disease ([place])   S/P mitral valve replacement   mitral regurg, endocarditis  Stroke [facility])  Past Surgical History: Past Surgical History: Procedure Laterality Date  BOWEL DECOMPRESSION N/A 10/19/2019  Procedure: BOWEL DECOMPRESSION;  Surgeon: Clarene Essex, MD;  Location: [facility] ENDOSCOPY;  Service: Endoscopy;  Laterality: N/A;  CATARACT EXTRACTION  2009, 2012  right 2009, left 2012  COLECTOMY WITH COLOSTOMY CREATION/HARTMANN PROCEDURE N/A 10/20/2019  Procedure: sigmoid COLECTOMY WITH COLOSTOMY CREATION/;  Surgeon: Armandina Gemma, MD;  Location: [facility] ORS;  Service: General;  Laterality: N/A;  FLEXIBLE SIGMOIDOSCOPY N/A 10/19/2019  Procedure: Beryle Quant;  Surgeon: Clarene Essex, MD;  Location: [facility] ENDOSCOPY;  Service: Endoscopy;  Laterality: N/A;  INTRAMEDULLARY (IM) NAIL INTERTROCHANTERIC Left 09/14/2019  Procedure: INTRAMEDULLARY (IM) NAIL INTERTROCHANTRIC;  Surgeon: Roby LoftsHaddix, Kevin P, MD;  Location: [facility] OR;  Service: Orthopedics;  Laterality: Left;  LOOP RECORDER IMPLANT Left 03/13/2015  Procedure: LOOP RECORDER IMPLANT;  Surgeon: Marinus MawGregg W Taylor, MD;  [facility];  Service: Cardiovascular;  Laterality: Left;  MITRAL VALVE ANNULOPLASTY  04/10/2007   26mm Edwards ring; r/t h/o MR and flail mitral leaflets due to endocarditis: MRI safe  TEE WITHOUT CARDIOVERSION N/A 03/13/2015  Procedure: TRANSESOPHAGEAL ECHOCARDIOGRAM (TEE);  Surgeon:  Laurey Moralealton S McLean, MD;  [facility];  Service: Cardiovascular;  Laterality: N/A;  TRANSTHORACIC ECHOCARDIOGRAM  08/15/2012  EF=>55%; normal LV systolic function; RV mildly dilated & systolic function mildly reduced; RA mod dilated; mild -mod MR & mildy increased gradients; mild TR; AV mildly sclerotic with mild-mod regurg HPI: 85 y.o. male with medical history significant of Parkinson's disease, mitral valve repair with history of endocarditis and mitral valve angioplasty in 2008, paroxysmal atrial fibrillation on Coumadin (previously on Eliquis but had inflammatory reaction), history of TIA, hyperlipidemia  was brought into the ED with complaint of abdominal pain.  History provided by wife who is at the bedside since patient is completely lethargic.  According to wife, he started having abdominal pain around 3 in the morning.  No further information about abdominal pain.  According to her, his last bowel movement was sometime yesterday but small amount of BM he passed this morning.  No history of nausea, vomiting, fever, chills, chest pain, shortness of breath or any other complaint..  Pt is s/p surgical intervention sigmoid colectomy, descending colostomy. Dr. Gerrit FriendsGerkin for Sigmoid Volvulus with infarction, perforation, and feculent peritonitis.  Pt has been seen by SLP for clinical swallow evaluation on 12/5/ coughing with thin liquids prior to intake per notes.  Has have small bore feeding tube but pulled it out today.  SLP follow for po readiness.  Subjective: pt asleep in bed- will awake to verbal/tactile stimulation Assessment / Plan / Recommendation CHL IP CLINICAL IMPRESSIONS 10/31/2019 Clinical Impression Pt presents with mild oropharyngeal dysphagia without aspiration of any consistency tested.  He does demonstrates decrreased initiation/coordination of swallow contributing to residuals.  After first few boluses of barium, pt's swallow became more efficient (due to his Parkinson's).  Severe  pharyngeal retention noted intially with tsp nectar and puree, and various postures including head turn left and chin tuck decreased accumulation.  Pharyngeal retention was significantly abated after initiation of few swallows with head neutral and cued effortful swallow. In addition, liquid aided pharyngeal clearance of increased visocity boluses retained in vallecular region.  Due to pt's weakness/deconditioning, h/o coughing with liquids at home recommend to start a conservative diet of dys3/nectar.  Recommend pt consume thin water between meals after mouth care. Anticipate diet modifier will only need to be temporary and as pt medical improves, advancement appropriate. Using teach back, educated pt to findings/recomemndations/compensation.  Marland Kitchen. SLP Visit Diagnosis Dysphagia, oropharyngeal phase (R13.12);Dysphagia, pharyngoesophageal phase (R13.14) Attention and concentration deficit following -- Frontal lobe and executive function deficit following -- Impact on safety and function Moderate aspiration risk;Risk for inadequate nutrition/hydration   CHL IP TREATMENT RECOMMENDATION 10/31/2019 Treatment Recommendations Therapy as outlined in treatment plan below   Prognosis 10/27/2019 Prognosis for Safe Diet Advancement Fair Barriers to Reach Goals Cognitive deficits Barriers/Prognosis Comment -- CHL IP DIET RECOMMENDATION 10/31/2019 SLP Diet Recommendations Dysphagia 3 (Mech soft) solids;Nectar thick liquid Liquid Administration via Cup;Straw Medication Administration Whole meds with puree Compensations Slow rate;Small  sips/bites;Follow solids with liquid Postural Changes Remain semi-upright after after feeds/meals (Comment)   CHL IP OTHER RECOMMENDATIONS 10/31/2019 Recommended Consults -- Oral Care Recommendations Oral care BID Other Recommendations --   CHL IP FOLLOW UP RECOMMENDATIONS 10/31/2019 Follow up Recommendations 24 hour supervision/assistance;Other (comment)   CHL IP FREQUENCY AND DURATION 10/31/2019 Speech  Therapy Frequency (ACUTE ONLY) min 3x week Treatment Duration 1 week      CHL IP ORAL PHASE 10/31/2019 Oral Phase Impaired Oral - Pudding Teaspoon -- Oral - Pudding Cup -- Oral - Honey Teaspoon -- Oral - Honey Cup -- Oral - Nectar Teaspoon Weak lingual manipulation;Reduced posterior propulsion;Piecemeal swallowing;Lingual/palatal residue Oral - Nectar Cup Weak lingual manipulation;Reduced posterior propulsion;Lingual/palatal residue;Piecemeal swallowing Oral - Nectar Straw Weak lingual manipulation Oral - Thin Teaspoon Weak lingual manipulation;Reduced posterior propulsion Oral - Thin Cup Weak lingual manipulation;Reduced posterior propulsion Oral - Thin Straw Reduced posterior propulsion;Weak lingual manipulation Oral - Puree Weak lingual manipulation;Reduced posterior propulsion;Delayed oral transit Oral - Mech Soft Weak lingual manipulation;Reduced posterior propulsion;Impaired mastication;Lingual/palatal residue Oral - Regular -- Oral - Multi-Consistency -- Oral - Pill -- Oral Phase - Comment pt required liquid to orally transit tablet after did not transit with puree  CHL IP PHARYNGEAL PHASE 10/31/2019 Pharyngeal Phase Impaired Pharyngeal- Pudding Teaspoon -- Pharyngeal -- Pharyngeal- Pudding Cup -- Pharyngeal -- Pharyngeal- Honey Teaspoon -- Pharyngeal -- Pharyngeal- Honey Cup -- Pharyngeal -- Pharyngeal- Nectar Teaspoon Reduced tongue base retraction;Pharyngeal residue - valleculae Pharyngeal Material does not enter airway Pharyngeal- Nectar Cup Reduced tongue base retraction;Pharyngeal residue - valleculae Pharyngeal Material does not enter airway Pharyngeal- Nectar Straw WFL Pharyngeal Material does not enter airway Pharyngeal- Thin Teaspoon WFL Pharyngeal Material does not enter airway Pharyngeal- Thin Cup [facility] Pharyngeal Material does not enter airway Pharyngeal- Thin Straw WFL Pharyngeal Material does not enter airway Pharyngeal- Puree Pharyngeal residue - valleculae;Reduced tongue base retraction  Pharyngeal Material does not enter airway Pharyngeal- Mechanical Soft Pharyngeal residue - valleculae;Reduced tongue base retraction Pharyngeal Material does not enter airway Pharyngeal- Regular -- Pharyngeal -- Pharyngeal- Multi-consistency -- Pharyngeal -- Pharyngeal- Pill WFL Pharyngeal Material does not enter airway Pharyngeal Comment various postures including head turn left and chin tuck decreased accumulation however after "warm up" swallows, pharyngeal retention was significantly decreased with head neutral and cued effortful swallow  CHL IP CERVICAL ESOPHAGEAL PHASE 10/31/2019 Cervical Esophageal Phase Impaired Pudding Teaspoon -- Pudding Cup -- Honey Teaspoon -- Honey Cup -- Nectar Teaspoon -- Nectar Cup -- Nectar Straw -- Thin Teaspoon -- Thin Cup -- Thin Straw -- Puree -- Mechanical Soft -- Regular -- Multi-consistency -- Pill -- Cervical Esophageal Comment no residuals noted at pyriform sinus or cricopharyngeal area however minimal UES impaired relaxation noted x1 during study with dry swallow Donavan Burnet, MS [facility] SLP Acute Rehab Services Pager [phone] Office [phone] [name] 10/31/2019, 10:10 AM               Anti-infectives: Anti-infectives (From admission, onward)   Start     Dose/Rate Route Frequency Ordered Stop   10/29/19 1600  fluconazole (DIFLUCAN) IVPB 100 mg     100 mg 50 mL/hr over 60 Minutes Intravenous  Once 10/29/19 1510 10/29/19 1750   10/19/19 2200  piperacillin-tazobactam (ZOSYN) IVPB 3.375 g  Status:  Discontinued     3.375 g 12.5 mL/hr over 240 Minutes Intravenous Every 8 hours 10/19/19 1802 10/27/19 0159   10/19/19 1700  vancomycin (VANCOCIN) 1,500 mg in sodium chloride 0.9 % 500 mL IVPB     1,500  mg 250 mL/hr over 120 Minutes Intravenous  Once 10/19/19 1636 10/19/19 1958   10/19/19 1630  ceFEPIme (MAXIPIME) 2 g in sodium chloride 0.9 % 100 mL IVPB     2 g 200 mL/hr over 30 Minutes Intravenous  Once 10/19/19 1629 10/19/19 1911        Assessment/Plan Parkinson's disease (HCC) Displaced intertrochanteric fracture of left femur, initial encounter for closed fracture (HCC) Chronic systolic CHF (congestive heart failure) (HCC) Dementia without behavioral disturbance (HCC) Sigmoid volvulus (HCC) Hypokalemia Hypotension RUE swelling-DVT studyneg Transaminitis-resolving VDRF- extubated 12/4 Dysphagia- D3 diet, will likely need calorie count.  Can't imagine he is going to take in adequate nutrition given his frail state.  POD13, S/PExploratory laparotomy, sigmoid colectomy, descending colostomy. Dr. Gerrit Friends for Sigmoid Volvuluswith infarction, perforation, and feculent peritonitis -goodostomy output -on D3 diet.  Patient is very frail.  Nutrition and deconditioning are going to be his hold ups in recovery.   - qshift wet-to-dry dressing changes to midline wound - Protonix daily -pulm toilet and IS -mobilize as able;PT/OT; anticipate will need SNF/LTAC.  FEN:D3 diet, nutritional supplements VTE: SCD's, lovenox,warfarin has been restarted CN:OBSJG 11/27-12/05,afebrile, WBCremains normal Foley:no Follow up:Dr.Gerkin   LOS: 13 days    Letha Cape , [facility] Surgery 11/01/2019, 10:12 AM Please see Amion for pager number during day hours 7:00am-4:30pm

## 2019-11-01 NOTE — Progress Notes (Signed)
Pink-tinged purulent drainage from colostomy, general surgery contacted. Orders for strict I&O. Will continue to monitor.

## 2019-11-01 NOTE — Progress Notes (Addendum)
  Speech Language Pathology Treatment: Dysphagia  Patient Details Name: [name] [name] MRN: [id number] DOB: [birth year] Today's Date: 11/01/2019 Time: 1501-1530 SLP Time Calculation (min) (ACUTE ONLY): 29 min  Assessment / Plan / Recommendation Clinical Impression  Pt today seen to assess po tolerance and determine indication for modifications to diet.  He is asleep in bed but awoke adequately for po intake.  RN reports blood sugars and BP has been low today.  When pt woke, excessive choreic limb movement noted *which wife confirmed yesterday is due to Parkinson's* which will increase calorie burning.  Pt was able to respond to SLP questions inconsistently with low voice - max cueing to increase amplitude. Intake of nectar thick "milkshake" with Ensure, egg nog and icecream provided via straw with hand over hand assistance.  Pt consumed an entire icecream cup, 1/3 Ensure container and residuals of egg nog (1/2 cup).     Audible swallow heard without indications of aspiration noted.  Pt consumption is effortful for him thus liquid is more efficient. Note pt without plans for PEG per notes, thus pushing intake as able indicated   Pt's wife brought in eggnog to him - and SLP created a "cooler" = pt recalled SLP plan within 5 minutes without cues fortunately!    Recommend to continue diet = maximize liquid nutritional intake.  Poor intake continues.      HPI HPI: 85 y.o. male with medical history significant of Parkinson's disease, mitral valve repair with history of endocarditis and mitral valve angioplasty in 2008, paroxysmal atrial fibrillation on Coumadin (previously on Eliquis but had inflammatory reaction), history of TIA, hyperlipidemia  was brought into the ED with complaint of abdominal pain.  History provided by wife who is at the bedside since patient is completely lethargic.  According to wife, he started having abdominal pain around 3 in the morning.  No further information about abdominal pain.   According to her, his last bowel movement was sometime yesterday but small amount of BM he passed this morning.  No history of nausea, vomiting, fever, chills, chest pain, shortness of breath or any other complaint..  Pt is s/p surgical intervention sigmoid colectomy, descending colostomy. Dr. Harlow Asa for Sigmoid Volvulus with infarction, perforation, and feculent peritonitis.  Pt has been seen by SLP for clinical swallow evaluation on 12/5/ coughing with thin liquids prior to intake per notes.  Has have small bore feeding tube and today underwent mbs. Session indicated to review testing results and compensation with pt's wife as she feeds the pt and was unable to present for MBS.      SLP Plan  Continue with current plan of care(10/31/2019)       Recommendations  Diet recommendations: Dysphagia 3 (mechanical soft);Nectar-thick liquid Liquids provided via: Cup;Straw Medication Administration: Crushed with puree Supervision: Staff to assist with self feeding Compensations: Slow rate;Small sips/bites;Follow solids with liquid Postural Changes and/or Swallow Maneuvers: Seated upright 90 degrees;Upright 30-60 min after meal                Oral Care Recommendations: Oral care QID Follow up Recommendations: 24 hour supervision/assistance;Other (comment)(or ) SLP Visit Diagnosis: Dysphagia, oropharyngeal phase (R13.12);Dysphagia, pharyngoesophageal phase (R13.14) Plan: Continue with current plan of care(10/31/2019)       GO                [name] [name] 11/01/2019, 3:40 PM  [name] [name], [place] [facility] SLP Acute Rehab Services Pager [phone] Office [phone]

## 2019-11-01 NOTE — Progress Notes (Signed)
[name] [name] APP - Progress Note  [name] [name] BMW:[id number] DOB: 11-03-1932 DOA: 10/19/2019 PCP: [name] Infante, MD   Brief narrative: Patient is a 85 year old with multiple medical problems including Parkinson's disease, mitral valve repair with history of endocarditis and mitral valve angioplasty in 2008, paroxysmal A. fib on Coumadin (previously on Eliquis but had inflammatory reaction), prior history of TIA, hyperlipidemia was admitted with abdominal pain, sigmoid volvulus.  He was seen by surgery and GI.  Patient underwent underwent sigmoidoscopy on 11/27 for decompression. On 11/28 become increasingly unstable with elevated lactic acid and shock.  Failed decompression and went to the OR for ex lap, sigmoid colectomy, colostomy creation, was intubated and admitted to ICU.  Patient was under critical care service, transfer to hospitalist service on 10/30/2019.  Significant hospital events 11/27- Admit, underwent flex sig for decompression 11/28- Worsening shock and lactic acidosis. Failed decompression and went to OR for ex-lap, sigmoid colectomy and colostomy creation. Returned to ICU vented 11/29 - Weaning vent settings 12/2 off pressors for > 24 hours. Weaning but not strong enough or awake enough to come off vent. Advancing tube feeds. IV diuresis for third spacing. Still over 13 liters positive. 12/3: Still too weak to extubate 12/4: Extubated 12/8: TRH assumes care    Subjective: His BP is still low. He is tolerating only small amounts of oral intake. He is sure he does not want a feeding tube.   Assessment/Plan: Principal Problem:   Acute respiratory failure with hypoxia (HCC) Active Problems:   Parkinson's disease ([place])   Displaced intertrochanteric fracture of left femur, initial encounter for closed fracture ([place])   Chronic systolic CHF (congestive heart failure) (HCC)   Dementia without behavioral disturbance (HCC)   Volvulus of colon (HCC)   Hypokalemia   Hypotension    Pleural effusion  Principal problem Sigmoid volvulus with perforation and peritonitis, status post sigmoid colectomy, colostomy creation  -Patient presented with sigmoid volvulus, failed flex sig w/ decompression- underwent sigmoid colectomy/colostomy on 11/28-found to have infarction with perforation and feculent peritonitis -General surgery following closely -Continue PPI, pulmonary toilet and IS, mobilize -IV Zosyn completed on 12/4  Mild oral pharyngeal dysphagia -Did not pass swallow evaluation on 12/6.  At baseline, on thickened liquids - Underwent swallowing evaluation on 12/8 with modified barium swallow study recommended.  This is been completed as of 12/9 with recommendation for dysphagia 3/mechanical soft solids with nectar thick liquids and thin water between meals.  Patient is allowed to utilize straw and can take home meds with pure.  -Encourage oral intake.   Acute respiratory failure with hypoxia, small pleural effusion status post mechanical ventilation -Patient was extubated on 12/5 -Continue aspiration and reflux precautions -Received Lasix on 12/7, however BP soft, will not tolerate any diuresis -Mobilize, pulmonary toilet, incentive spirometry -O2 sats 100% on room air  Paroxysmal A. fib with RVR/history of mitral valve repair -Currently heart rate controlled, SBP in 80s on 12/8 therefore metoprolol held -Continue Coumadin -INR 2.6 -Systolic mitral murmur on exam  Acute metabolic encephalopathy with history of Parkinson's disease, dementia -Continue amantadine, carbidopa/levodopa, Aricept -Per spouse, does not tolerate benzodiazepines well -Continue delirium prevention interventions, mobilization -Mentation appears to be at baseline today.   Hypokalemia Replaced as needed, recheck level in am.   Severe protein calorie malnutrition, severe physical deconditioning -See above regarding initiation of D3 diet -PT OT evaluation, will likely need SNF    Hypotension In setting of poor intake per mouth. He is on IV fluids currently  but his BP is still low, with MAP fluctuating below 65 at times. Will start low dose midodrine 2.24m po TID and monitor.   DVT prophylaxis: Warfarin  Code Status:  DNR  Family Communication:  Updated wife at the bedside.   Disposition Plan/Expected LOS: Anticipate eventual DC to SNF  Consultants: GI PCCM General surgery  Procedures: 11/28 - sigmoid colectomy with colostomy.  Intubation and extubation   Cultures: MRSA PCR negative Urine culture negative Blood cultures negative  Antibiotics: Anti-infectives (From admission, onward)   Start     Dose/Rate Route Frequency Ordered Stop   10/29/19 1600  fluconazole (DIFLUCAN) IVPB 100 mg     100 mg 50 mL/hr over 60 Minutes Intravenous  Once 10/29/19 1510 10/29/19 1750   10/19/19 2200  piperacillin-tazobactam (ZOSYN) IVPB 3.375 g  Status:  Discontinued     3.375 g 12.5 mL/hr over 240 Minutes Intravenous Every 8 hours 10/19/19 1802 10/27/19 0159   10/19/19 1700  vancomycin (VANCOCIN) 1,500 mg in sodium chloride 0.9 % 500 mL IVPB     1,500 mg 250 mL/hr over 120 Minutes Intravenous  Once 10/19/19 1636 10/19/19 1958   10/19/19 1630  ceFEPIme (MAXIPIME) 2 g in sodium chloride 0.9 % 100 mL IVPB     2 g 200 mL/hr over 30 Minutes Intravenous  Once 10/19/19 1629 10/19/19 1911      Objective: Blood pressure (!) 96/57, pulse 75, temperature 97.8 F (36.6 C), temperature source Axillary, resp. rate (!) 21, height _0  (1.727 m), weight 72.6 kg, SpO2 99 %.  Intake/Output Summary (Last 24 hours) at 11/01/2019 1229 Last data filed at 11/01/2019 1117 Gross per 24 hour  Intake 2199.12 ml  Output 500 ml  Net 1699.12 ml   Estimated body mass index is 24.34 kg/m as calculated from the following:   Height as of this encounter: _1  (1.727 m).   Weight as of this encounter: 72.6 kg.   Malnutrition Type:  Nutrition Problem: Increased nutrient needs  Etiology: acute illness   Malnutrition Characteristics:  Signs/Symptoms: estimated needs   Nutrition Interventions:  Interventions: Prostat, Tube feeding-as of 12/9 advancing to D3 diet with nectar thick liquids    Exam: Gen: Sitting in recliner, in NAD Psych: Awake, oriented x name/place Chest: Normal RR, no cough Cardiac: irregularly irregular rate Abdomen: Soft nontender nondistended surgical incision covered by clean dry dressing, left lower quadrant with ostomy and notable liquid stool in bag-stoma pink and nonedematous. GU: Foley catheter with clear yellow colored urine Extremities: Symmetrical in appearance without cyanosis, clubbing or effusion  Scheduled Meds:  Scheduled Meds: . amantadine  100 mg Oral BID  . Carbidopa-Levodopa ER  1 capsule Oral QHS  . Carbidopa-Levodopa ER  2 capsule Oral Daily  . Carbidopa-Levodopa ER  3 capsule Oral 2 times per day  . chlorhexidine gluconate (MEDLINE KIT)  15 mL Mouth Rinse BID  . Chlorhexidine Gluconate Cloth  6 each Topical Daily  . donepezil  10 mg Oral QHS  . [START ON 11/02/2019] escitalopram  10 mg Oral Daily  . feeding supplement  1 Container Oral TID BM  . feeding supplement (PRO-STAT SUGAR FREE 64)  30 mL Oral BID  . midodrine  2.5 mg Oral TID WC  . [START ON 11/02/2019] pantoprazole  40 mg Oral Daily  . rotigotine  1 patch Transdermal Daily  . sodium chloride flush  10-40 mL Intracatheter Q12H  . warfarin  1 mg Oral ONCE-1800  . Warfarin - Pharmacist Dosing Inpatient  Does not apply q1800   Continuous Infusions: . dextrose 5 % and 0.45% NaCl 75 mL/hr at 11/01/19 0049    Data Reviewed: Basic Metabolic Panel: Recent Labs  Lab 10/28/19 0400 10/29/19 0430 10/30/19 0420 10/31/19 0158 11/01/19 0220  NA 142 141 140 138 140  K 3.3* 3.7 3.2* 3.7 3.2*  CL 105 108 107 106 109  CO2 _0 GLUCOSE 118* 98 130* 119* 88  BUN 27* 25* 23 25* 21  CREATININE 0.89 0.83 0.76 0.73 0.64  CALCIUM 7.9* 8.0* 7.7*  8.1* 8.0*  MG 1.9  --  1.8  --   --   PHOS  --   --  2.6  --   --    Liver Function Tests: Recent Labs  Lab 10/26/19 0326 10/29/19 0430  AST 46* 35  ALT 9 7  ALKPHOS 165* 120  BILITOT 2.4* 2.0*  PROT 4.9* 5.0*  ALBUMIN 2.1* 2.3*   No results for input(s): LIPASE, AMYLASE in the last 168 hours. No results for input(s): AMMONIA in the last 168 hours. CBC: Recent Labs  Lab 10/26/19 0326 10/27/19 0325 10/28/19 0400 10/29/19 0430 10/30/19 0420 10/31/19 0158 11/01/19 0220  WBC 7.1 8.7 9.2 9.9 9.9 9.7 7.5  NEUTROABS 5.6 7.1 7.7 8.4*  --   --   --   HGB 11.3* 11.4* 11.4* 11.4* 11.5* 11.7* 11.0*  HCT 34.7* 36.3* 35.7* 35.9* 35.7* 36.6* 34.3*  MCV 97.2 99.5 99.2 99.7 99.2 100.5* 99.7  PLT 112* 138* 150 183 211 227 223   Cardiac Enzymes: No results for input(s): CKTOTAL, CKMB, CKMBINDEX, TROPONINI in the last 168 hours. BNP (last 3 results) Recent Labs    08/24/19 1206  BNP 670.1*    ProBNP (last 3 results) No results for input(s): PROBNP in the last 8760 hours.  CBG: Recent Labs  Lab 10/31/19 2323 11/01/19 0023 11/01/19 0322 11/01/19 0426 11/01/19 0744  GLUCAP 62* 84 45* 84 70    No results found for this or any previous visit (from the past 240 hour(s)).     Time spent : 35 minutes  Blain Pais, MD **If unable to reach the above provider after paging please contact the [facility] @ [phone]  On-Call/Text Page:      [email or web address]      password [email or web address]  If 7PM-7AM, please contact night-coverage [email or web address] Password [email or web address] 11/01/2019, 12:29 PM   LOS: 13 days

## 2019-11-01 NOTE — Progress Notes (Signed)
Hypoglycemic Event  CBG: 45  Treatment: 2 tubes glucose gel  Symptoms: Shaky  Follow-up CBG: Time:0420 CBG Result: 84  Possible Reasons for Event: Inadequate meal intake  Comments/MD notified:TRH notified. Ordered to give 36ml of D50     [name] [name]

## 2019-11-01 NOTE — Evaluation (Signed)
Occupational Therapy Evaluation Patient Details Name: [name] [name] [name] MRN: [id number] DOB: [birth year] Today'[name] [name] Date: 11/01/2019    History of Present Illness Pt POD 11 from Exp lap with colostomy.  Pt with hx of L hip fx with IM nailing 09/14/19, CVA, Mitral valve replacement, CHF, dementia and Parkinsons   Clinical Impression   This 85 year old man was admitted for the above. At baseline, he was recently home from [place], and wife had hired a caregiver to assist him. Wife was unable to assist. Pt now needs +2 caregivers for mobility/adls. Will follow in acute setting with mod A level goals of one for mobility related to adls.  Discussed recommendation of snf with wife.     Follow Up Recommendations  SNF    Equipment Recommendations  (defer to next venue)    Recommendations for Other Services       Precautions / Restrictions Precautions Precautions: Fall Restrictions Weight Bearing Restrictions: No Other Position/Activity Restrictions: WBAT      Mobility Bed Mobility Overal bed mobility: Needs Assistance Bed Mobility: Sidelying to Sit;Rolling Rolling: Max assist;+2 for physical assistance Sidelying to sit: Max assist;+2 for physical assistance;+2 for safety/equipment       General bed mobility comments: cues for sequence but limited assist by pt  Transfers Overall transfer level: Needs assistance Equipment used: Rolling walker (2 wheeled) Transfers: Sit to/from [employer] Sit to Stand: Mod assist;Max assist;+2 physical assistance;+2 safety/equipment;From elevated surface Stand pivot transfers: Max assist;+2 physical assistance;+2 safety/equipment;From elevated surface       General transfer comment: Pt sit<>stand x 2 with feet blocked and cues to bring hips forward but still unable to achieve fully erect standing.  Pvt bed to chair  max assist    Balance Overall balance assessment: Needs assistance Sitting-balance support: Bilateral upper extremity  supported Sitting balance-Leahy Scale: Poor     Standing balance support: Bilateral upper extremity supported Standing balance-Leahy Scale: Poor                             ADL either performed or assessed with clinical judgement   ADL Overall ADL'[name] [name] : Needs assistance/impaired Eating/Feeding: Maximal assistance   Grooming: Maximal assistance                   Toilet Transfer: Maximal assistance;+2 for physical assistance;Stand-pivot(to chair)             General ADL Comments: pt with "busy hands"  Needs max to total A for most adls at this time     Vision         Perception     Praxis      Pertinent Vitals/Pain Pain Assessment: No/denies pain     Hand Dominance Right   Extremity/Trunk Assessment Upper Extremity Assessment Upper Extremity Assessment: Generalized weakness   Lower Extremity Assessment Lower Extremity Assessment: Generalized weakness   Cervical / Trunk Assessment Cervical / Trunk Assessment: Kyphotic   Communication Communication Communication: No difficulties   Cognition Arousal/Alertness: Awake/alert Behavior During Therapy: Impulsive Overall Cognitive Status: Impaired/Different from baseline(unsure of baseline) Area of Impairment: Attention;Memory;Following commands;Safety/judgement                   Current Attention Level: Sustained   Following Commands: Follows one step commands with increased time Safety/Judgement: Decreased awareness of safety;Decreased awareness of deficits     General Comments: some delayed processing   General Comments       Exercises  Shoulder Instructions      Home Living Family/patient expects to be discharged to:: Skilled nursing facility Living Arrangements: Spouse/significant other                                      Prior Functioning/Environment Level of Independence: Needs assistance  Gait / Transfers Assistance Needed: uses Rollator ADL'[name] [name] /  Homemaking Assistance Needed: Wife had hired assist to manage pt 2* significant assist required to mobilize            OT Problem List: Decreased strength;Decreased activity tolerance;Impaired balance (sitting and/or standing);Decreased safety awareness;Decreased knowledge of use of DME or AE;Decreased cognition;Impaired UE functional use      OT Treatment/Interventions: Self-care/ADL training;DME and/or AE instruction;Patient/family education;Balance training;Therapeutic activities;Cognitive remediation/compensation    OT Goals(Current goals can be found in the care plan section) Acute Rehab OT Goals Patient Stated Goal: wife would like pt to get strong enough so he can return home with assist of one caregiver OT Goal Formulation: With patient/family Time For Goal Achievement: 11/15/19 Potential to Achieve Goals: Good ADL Goals Pt Will Transfer to Toilet: bedside commode;with mod assist;stand pivot transfer Additional ADL Goal #1: pt will perform bed mob at mod A level in preparation for adls Additional ADL Goal #2: pt will perform sit to stand with mod A and maintain with min A for adls Additional ADL Goal #3: pt will sit EOB with min guard and perform simple strengthening or grooming task  OT Frequency: Min 2X/week   Barriers to D/C:            Co-evaluation PT/OT/SLP Co-Evaluation/Treatment: Yes Reason for Co-Treatment: For patient/therapist safety;Complexity of the patient'[name] [name] impairments (multi-system involvement) PT goals addressed during session: Mobility/safety with mobility OT goals addressed during session: Strengthening/ROM      AM-PAC OT "6 Clicks" Daily Activity     Outcome Measure Help from another person eating meals?: A Lot Help from another person taking care of personal grooming?: A Lot Help from another person toileting, which includes using toliet, bedpan, or urinal?: Total Help from another person bathing (including washing, rinsing, drying)?: Total Help  from another person to put on and taking off regular upper body clothing?: A Lot Help from another person to put on and taking off regular lower body clothing?: Total 6 Click Score: 9   End of Session Nurse Communication: Mobility status;Need for lift equipment(maxisky not working:  [employer] pad placed)  Activity Tolerance: Patient tolerated treatment well Patient left: in chair;with call bell/phone within reach;with family/visitor present  OT Visit Diagnosis: Unsteadiness on feet (R26.81);Muscle weakness (generalized) (M62.81)                Time: 2426-8341 OT Time Calculation (min): 34 min Charges:  OT General Charges $OT Visit: 1 Visit OT Evaluation $OT Eval Moderate Complexity: 1 Mod  [name] [name] [name], [name] [name] [name] Acute Rehabilitation Services 11/01/2019  [name] [name] [name] 11/01/2019, 3:09 PM

## 2019-11-01 NOTE — Evaluation (Signed)
Physical Therapy Evaluation Patient Details Name: [name] [name] MRN: [id number] DOB: 1932/01/25 Today's Date: 11/01/2019   History of Present Illness  Pt POD 11 from Exp lap with colostomy.  Pt with hx of L hip fx with IM nailing 09/14/19, CVA, Mitral valve replacement, CHF, dementia and Parkinsons  Clinical Impression  Pt admitted as above and presenting with functional mobility limitations 2* generalized weakness, balance deficits and poor endurance.  Pt is currently requiring significant assist of 2 for performance of all mobility tasks and would benefit from follow up rehab at SNF level to maximize IND and safety prior to return home with limited assist.    Follow Up Recommendations SNF    Equipment Recommendations  None recommended by PT    Recommendations for Other Services       Precautions / Restrictions Precautions Precautions: Fall Restrictions Weight Bearing Restrictions: No Other Position/Activity Restrictions: WBAT      Mobility  Bed Mobility Overal bed mobility: Needs Assistance Bed Mobility: Sidelying to Sit;Rolling Rolling: Max assist;+2 for physical assistance Sidelying to sit: Max assist;+2 for physical assistance;+2 for safety/equipment       General bed mobility comments: cues for sequence but limited assist by pt  Transfers Overall transfer level: Needs assistance Equipment used: Rolling walker (2 wheeled) Transfers: Sit to/from [employer] Sit to Stand: Mod assist;Max assist;+2 physical assistance;+2 safety/equipment;From elevated surface Stand pivot transfers: Max assist;+2 physical assistance;+2 safety/equipment;From elevated surface       General transfer comment: Pt sit<>stand x 2 with feet blocked and cues to bring hips forward but still unable to achieve fully erect standing.  Pvt bed to chair  max assist  Ambulation/Gait             General Gait Details: Pt unable to initiate step   Stairs            Wheelchair  Mobility    Modified Rankin (Stroke Patients Only)       Balance Overall balance assessment: Needs assistance Sitting-balance support: Bilateral upper extremity supported Sitting balance-Leahy Scale: Poor     Standing balance support: Bilateral upper extremity supported Standing balance-Leahy Scale: Poor                               Pertinent Vitals/Pain Pain Assessment: No/denies pain    Home Living Family/patient expects to be discharged to:: Skilled nursing facility                      Prior Function Level of Independence: Needs assistance   Gait / Transfers Assistance Needed: uses Rollator  ADL's / Homemaking Assistance Needed: Wife had hired assist to manage pt 2* significant assist required to mobilize        Hand Dominance   Dominant Hand: Right    Extremity/Trunk Assessment   Upper Extremity Assessment Upper Extremity Assessment: Defer to OT evaluation    Lower Extremity Assessment Lower Extremity Assessment: Generalized weakness    Cervical / Trunk Assessment Cervical / Trunk Assessment: Kyphotic  Communication   Communication: No difficulties  Cognition Arousal/Alertness: Awake/alert Behavior During Therapy: Impulsive Overall Cognitive Status: Within Functional Limits for tasks assessed                                        General Comments      Exercises  Assessment/Plan    PT Assessment Patient needs continued PT services  PT Problem List Decreased strength;Decreased activity tolerance;Decreased balance;Decreased mobility;Decreased knowledge of use of DME;Decreased coordination;Decreased safety awareness       PT Treatment Interventions DME instruction;Gait training;Functional mobility training;Therapeutic activities;Therapeutic exercise;Patient/family education;Balance training    PT Goals (Current goals can be found in the Care Plan section)  Acute Rehab PT Goals Patient Stated Goal: No  goals expressed PT Goal Formulation: With patient Time For Goal Achievement: 11/15/19 Potential to Achieve Goals: Fair    Frequency Min 3X/week   Barriers to discharge Decreased caregiver support Supportive spouse but unable to provide level of assist pt currently needs    Co-evaluation               AM-PAC PT "6 Clicks" Mobility  Outcome Measure Help needed turning from your back to your side while in a flat bed without using bedrails?: A Lot Help needed moving from lying on your back to sitting on the side of a flat bed without using bedrails?: A Lot Help needed moving to and from a bed to a chair (including a wheelchair)?: A Lot Help needed standing up from a chair using your arms (e.g., wheelchair or bedside chair)?: A Lot Help needed to walk in hospital room?: Total Help needed climbing 3-5 steps with a railing? : Total 6 Click Score: 10    End of Session Equipment Utilized During Treatment: Gait belt Activity Tolerance: Patient limited by fatigue Patient left: in chair;with call bell/phone within reach;with chair alarm set;with family/visitor present Nurse Communication: Need for lift equipment;Mobility status PT Visit Diagnosis: Difficulty in walking, not elsewhere classified (R26.2);History of falling (Z91.81)    Time: 9509-3267 PT Time Calculation (min) (ACUTE ONLY): 32 min   Charges:   PT Evaluation $PT Eval Moderate Complexity: 1 Mod          Mauro Kaufmann PT Acute Rehabilitation Services Pager [phone] Office [phone]   Mae Denunzio 11/01/2019, 12:47 PM

## 2019-11-01 NOTE — Progress Notes (Signed)
Hypoglycemic Event  CBG: 62  Treatment:  D50 25 mL (12.5 gm)  Symptoms:  Shaky  Follow-up CBG: Time: 2330 CBG Result: 82  Possible Reasons for Event: DYS 3 diet  Inadequate meal intake  Comments/MD notified: RN awaiting orders     Tyrone Nine

## 2019-11-02 LAB — BASIC METABOLIC PANEL
Anion gap: 8 (ref 5–15)
BUN: 19 mg/dL (ref 8–23)
CO2: 22 mmol/L (ref 22–32)
Calcium: 8 mg/dL — ABNORMAL LOW (ref 8.9–10.3)
Chloride: 107 mmol/L (ref 98–111)
Creatinine, Ser: 0.67 mg/dL (ref 0.61–1.24)
GFR calc Af Amer: >60 mL/min (ref >60)
GFR calc non Af Amer: >60 mL/min (ref >60)
Glucose, Bld: 78 mg/dL (ref 70–99)
Potassium: 3.5 mmol/L (ref 3.5–5.1)
Sodium: 137 mmol/L (ref 135–145)

## 2019-11-02 LAB — PROTIME-INR
INR: 4.2 (ref 0.8–1.2)
Prothrombin Time: 40.2 seconds — ABNORMAL HIGH (ref 11.4–15.2)

## 2019-11-02 LAB — CBC
HCT: 34 % — ABNORMAL LOW (ref 39.0–52.0)
Hemoglobin: 10.6 g/dL — ABNORMAL LOW (ref 13.0–17.0)
MCH: 31.9 pg (ref 26.0–34.0)
MCHC: 31.2 g/dL (ref 30.0–36.0)
MCV: 102.4 fL — ABNORMAL HIGH (ref 80.0–100.0)
Platelets: 253 10*3/uL (ref 150–400)
RBC: 3.32 MIL/uL — ABNORMAL LOW (ref 4.22–5.81)
RDW: 16.9 % — ABNORMAL HIGH (ref 11.5–15.5)
WBC: 6.7 10*3/uL (ref 4.0–10.5)
nRBC: 0 % (ref 0.0–0.2)

## 2019-11-02 LAB — GLUCOSE, CAPILLARY
Glucose-Capillary: 134 mg/dL — ABNORMAL HIGH (ref 70–99)
Glucose-Capillary: 37 mg/dL — CL (ref 70–99)
Glucose-Capillary: 77 mg/dL (ref 70–99)
Glucose-Capillary: 87 mg/dL (ref 70–99)
Glucose-Capillary: 87 mg/dL (ref 70–99)
Glucose-Capillary: 95 mg/dL (ref 70–99)

## 2019-11-02 MED ORDER — RESOURCE THICKENUP CLEAR PO POWD
ORAL | Status: DC | PRN
  Filled 2019-11-02: qty 125

## 2019-11-02 NOTE — Progress Notes (Signed)
Speech Language Pathology Treatment: Dysphagia  Patient Details Name: [name] [name] MRN: [id number] DOB: 10/07/1932 Today's Date: 11/02/2019 Time:  -     Assessment / Plan / Recommendation Clinical Impression  SLP follow up for po tolerance, education, modification of diet as indicated. spouse present and feeding pt.  He was in bed, - leaning to the right and willing to let SLP reposition him upright.  Prolonged mastication of solids noted - wife pointed this out - Wife provided pt with Boost Breeze to help transit solids however immediately after swallow pt was observed to cough for approximately 1 minute with oxygen saturations decreased to 85.  SlP advised not to use liquid to clear solids into pharynx and provided her/him with pudding.  Demonstrated to wife use of pudding to transit masticated macaroni and cheese into pharynx without evidence of aspiration.   Wife reports "that is helpful" and plan to use strategy.   Wife reports pt does not like thicker liquids - thus given Boost Breeze is slightly thicker advised appropriate to use during meals.  Between meals recommend pt be allowed thin liquids with NO OTHER intake.  Implored to wife to assure pt is not coughing with intake of thin - and if does so - then she needs to decrease bolus to tsp size.  Pt is confused today - speaking of cards, stone work and a car.  He was able to answer questions appropriately for personal needs.  Wife reports a palliative referral has been recommended - SlP encouraged her particiipation in meeting for which she expressed gratitude.  Although pt intake is poor, pt's wife is demonstrating understanding to swallow precautions, mitigation strategies and concern for increased aspiration risk /pulmonary compromise with aspiration of thin liquids.    HPI HPI: 85 y.o. male with medical history significant of Parkinson's disease, mitral valve repair with history of endocarditis and mitral valve angioplasty in 2008, paroxysmal  atrial fibrillation on Coumadin (previously on Eliquis but had inflammatory reaction), history of TIA, hyperlipidemia  was brought into the ED with complaint of abdominal pain.  History provided by wife who is at the bedside since patient is completely lethargic.  According to wife, he started having abdominal pain around 3 in the morning.  No further information about abdominal pain.  According to her, his last bowel movement was sometime yesterday but small amount of BM he passed this morning.  No history of nausea, vomiting, fever, chills, chest pain, shortness of breath or any other complaint..  Pt is s/p surgical intervention sigmoid colectomy, descending colostomy. Dr. Harlow Asa for Sigmoid Volvulus with infarction, perforation, and feculent peritonitis.  Pt has been seen by SLP for clinical swallow evaluation on 12/5/ coughing with thin liquids prior to intake per notes.  Pt has had feeding tube removed and is consuming dys3/nectar diet.      SLP Plan  Continue with current plan of care(10/31/2019)       Recommendations  Diet recommendations: Dysphagia 3 (mechanical soft);Thin liquid(boost breeze with meals) Liquids provided via: Cup;Straw Medication Administration: Crushed with puree Supervision: Staff to assist with self feeding Compensations: Slow rate;Small sips/bites;Follow solids with liquid(use puree to orally transit masticated solids into pharynx) Postural Changes and/or Swallow Maneuvers: Seated upright 90 degrees;Upright 30-60 min after meal                Oral Care Recommendations: Oral care QID Follow up Recommendations: 24 hour supervision/assistance;Other (comment)(or ) SLP Visit Diagnosis: Dysphagia, oropharyngeal phase (R13.12);Dysphagia, pharyngoesophageal phase (R13.14) Plan: Continue with  current plan of care(10/31/2019)       GO                [name] [name] 11/02/2019, 12:45 PM  [name] Infante, MS [facility] SLP Acute Rehab Services Office [phone]

## 2019-11-02 NOTE — Progress Notes (Signed)
CRITICAL VALUE ALERT  Critical Value:  INR 4.2  Date & Time Notied:  12/11 0923   Provider Notified: Hayes Ludwig MD   Orders Received/Actions taken: Monitor for signs of bleeding

## 2019-11-02 NOTE — Progress Notes (Signed)
[place] for Warfarin Indication: MV angioplasty  Allergies  Allergen Reactions  . Apixaban Palpitations, Rash and Other (See Comments)    Worsening tardive dyskinesia and rapid heart rate  . Ace Inhibitors Cough  . Demerol [Meperidine] Other (See Comments)    Parkinsons disease  . Poison Ivy Extract Itching, Swelling and Rash   Patient Measurements: Height: 5\' 8"  (172.7 cm) Weight: 160 lb 4.4 oz (72.7 kg) IBW/kg (Calculated) : 68.4  Vital Signs: Temp: 99.4 F (37.4 C) (12/11 0352) Temp Source: Axillary (12/11 0352) BP: 103/61 (12/11 0800) Pulse Rate: 80 (12/11 0800)  Labs: Recent Labs    10/31/19 0158 11/01/19 0220 11/02/19 0213  HGB 11.7* 11.0* 10.6*  HCT 36.6* 34.3* 34.0*  PLT 227 223 253  LABPROT 27.7* 32.3* 40.2*  INR 2.6* 3.2* 4.2*  CREATININE 0.73 0.64 0.67   Estimated Creatinine Clearance: 65.3 mL/min (by C-G formula based on SCr of 0.67 mg/dL).  Assessment:  89 yoM with sigmoid volvulus perforation > feculent peritonitis, 11/27 sigmoidoscopy, 11/28 colectomy with colostomy.  Intubated 11/28 >> 12/4 extubated, plan resume Warfarin once passes swallow study. INR goal 2-3 per Coumadin clinic notes. PTA Warfarin 2.5mg  daily, LD 11/26   Today, 11/02/2019  INR is 4.2  supra-therapeutic, PT is 40.2 sec   H/H stable, Plt now WNL    No bleeding reported  Goal of Therapy:  INR 2-3 Monitor platelets by anticoagulation protocol: Yes   Plan:   No warfarin dose today as INR is elevated   Daily Protime/INR   Monitor for signs and symptoms of bleeding    Royetta Asal, PharmD, BCPS 11/02/2019 8:35 AM

## 2019-11-02 NOTE — Progress Notes (Signed)
13 Days Post-Op  Subjective: arousable but confused  Objective: Vital signs in last 24 hours: Temp:  [97.1 F (36.2 C)-99.4 F (37.4 C)] 99.4 F (37.4 C) (12/11 0352) Pulse Rate:  [70-100] 80 (12/11 0800) Resp:  [17-28] 18 (12/11 0800) BP: (78-120)/(24-70) 103/61 (12/11 0800) SpO2:  [96 %-100 %] 100 % (12/11 0800) Weight:  [72.7 kg] 72.7 kg (12/11 0406) Last BM Date: 11/01/19  Intake/Output from previous day: 12/10 0701 - 12/11 0700 In: 2061.4 [P.O.:118; I.V.:1943.4] Out: 650 [Urine:600; Stool:50] Intake/Output this shift: No intake/output data recorded.  General appearance: cooperative and slowed mentation Resp: clear to auscultation bilaterally Cardio: regular rate and rhythm GI: soft, nontender. ostomy pink and productive  Lab Results:  Recent Labs    11/01/19 0220 11/02/19 0213  WBC 7.5 6.7  HGB 11.0* 10.6*  HCT 34.3* 34.0*  PLT 223 253   BMET Recent Labs    11/01/19 0220 11/02/19 0213  NA 140 137  K 3.2* 3.5  CL 109 107  CO2 24 22  GLUCOSE 88 78  BUN 21 19  CREATININE 0.64 0.67  CALCIUM 8.0* 8.0*   PT/INR Recent Labs    11/01/19 0220 11/02/19 0213  LABPROT 32.3* 40.2*  INR 3.2* 4.2*   ABG No results for input(s): PHART, HCO3 in the last 72 hours.  Invalid input(s): PCO2, PO2  Studies/Results: DG Swallowing Func-Speech Pathology  Result Date: 10/31/2019 Objective Swallowing Evaluation: Type of Study: Bedside Swallow Evaluation  Patient Details Name: [name] [name] MRN: [id number] Date of Birth: 25-Dec-1931 Today's Date: 10/31/2019 Time: SLP Start Time (ACUTE ONLY): 8341 -SLP Stop Time (ACUTE ONLY): 0935 SLP Time Calculation (min) (ACUTE ONLY): 30 min Past Medical History: Past Medical History: Diagnosis Date . Dyslipidemia  . Parkinson's disease (HCC)  . S/P mitral valve replacement   mitral regurg, endocarditis . Stroke [facility])  Past Surgical History: Past Surgical History: Procedure Laterality Date . BOWEL DECOMPRESSION N/A 10/19/2019  Procedure:  BOWEL DECOMPRESSION;  Surgeon: Vida Rigger, MD;  Location: [facility] ENDOSCOPY;  Service: Endoscopy;  Laterality: N/A; . CATARACT EXTRACTION  2009, 2012  right 2009, left 2012 . COLECTOMY WITH COLOSTOMY CREATION/HARTMANN PROCEDURE N/A 10/20/2019  Procedure: sigmoid COLECTOMY WITH COLOSTOMY CREATION/;  Surgeon: Darnell Level, MD;  Location: [facility] ORS;  Service: General;  Laterality: N/A; . FLEXIBLE SIGMOIDOSCOPY N/A 10/19/2019  Procedure: Arnell Sieving;  Surgeon: Vida Rigger, MD;  Location: [facility] ENDOSCOPY;  Service: Endoscopy;  Laterality: N/A; . INTRAMEDULLARY (IM) NAIL INTERTROCHANTERIC Left 09/14/2019  Procedure: INTRAMEDULLARY (IM) NAIL INTERTROCHANTRIC;  Surgeon: Roby Lofts, MD;  Location: [facility] OR;  Service: Orthopedics;  Laterality: Left; . LOOP RECORDER IMPLANT Left 03/13/2015  Procedure: LOOP RECORDER IMPLANT;  Surgeon: Marinus Maw, MD;  Location[facility] C[facility] LAB;  Service: Cardiovascular;  Laterality: Left; . MITRAL VALVE ANNULOPLASTY  04/10/2007   72mm Edwards ring; r/t h/o MR and flail mitral leaflets due to endocarditis: MRI safe . TEE WITHOUT CARDIOVERSION N/A 03/13/2015  Procedure: TRANSESOPHAGEAL ECHOCARDIOGRAM (TEE);  Surgeon: Laurey Morale, MD;  Location: [facility] ENDOSCOPY;  Service: Cardiovascular;  Laterality: N/A; . TRANSTHORACIC ECHOCARDIOGRAM  08/15/2012  EF=>55%; normal LV systolic function; RV mildly dilated & systolic function mildly reduced; RA mod dilated; mild -mod MR & mildy increased gradients; mild TR; AV mildly sclerotic with mild-mod regurg HPI: 85 y.o. male with medical history significant of Parkinson's disease, mitral valve repair with history of endocarditis and mitral valve angioplasty in 2008, paroxysmal atrial fibrillation on Coumadin (previously on Eliquis but had inflammatory reaction), history of TIA, hyperlipidemia  was brought into the ED with complaint of abdominal pain.  History provided by wife who is at the bedside since patient is completely lethargic.  According to wife, he  started having abdominal pain around 3 in the morning.  No further information about abdominal pain.  According to her, his last bowel movement was sometime yesterday but small amount of BM he passed this morning.  No history of nausea, vomiting, fever, chills, chest pain, shortness of breath or any other complaint..  Pt is s/p surgical intervention sigmoid colectomy, descending colostomy. Dr. Harlow Asa for Sigmoid Volvulus with infarction, perforation, and feculent peritonitis.  Pt has been seen by SLP for clinical swallow evaluation on 12/5/ coughing with thin liquids prior to intake per notes.  Has have small bore feeding tube but pulled it out today.  SLP follow for po readiness.  Subjective: pt asleep in bed- will awake to verbal/tactile stimulation Assessment / Plan / Recommendation CHL IP CLINICAL IMPRESSIONS 10/31/2019 Clinical Impression Pt presents with mild oropharyngeal dysphagia without aspiration of any consistency tested.  He does demonstrates decrreased initiation/coordination of swallow contributing to residuals.  After first few boluses of barium, pt's swallow became more efficient (due to his Parkinson's).  Severe pharyngeal retention noted intially with tsp nectar and puree, and various postures including head turn left and chin tuck decreased accumulation.  Pharyngeal retention was significantly abated after initiation of few swallows with head neutral and cued effortful swallow. In addition, liquid aided pharyngeal clearance of increased visocity boluses retained in vallecular region.  Due to pt's weakness/deconditioning, h/o coughing with liquids at home recommend to start a conservative diet of dys3/nectar.  Recommend pt consume thin water between meals after mouth care. Anticipate diet modifier will only need to be temporary and as pt medical improves, advancement appropriate. Using teach back, educated pt to findings/recomemndations/compensation.  Marland Kitchen SLP Visit Diagnosis Dysphagia, oropharyngeal  phase (R13.12);Dysphagia, pharyngoesophageal phase (R13.14) Attention and concentration deficit following -- Frontal lobe and executive function deficit following -- Impact on safety and function Moderate aspiration risk;Risk for inadequate nutrition/hydration   CHL IP TREATMENT RECOMMENDATION 10/31/2019 Treatment Recommendations Therapy as outlined in treatment plan below   Prognosis 10/27/2019 Prognosis for Safe Diet Advancement Fair Barriers to Reach Goals Cognitive deficits Barriers/Prognosis Comment -- CHL IP DIET RECOMMENDATION 10/31/2019 SLP Diet Recommendations Dysphagia 3 (Mech soft) solids;Nectar thick liquid Liquid Administration via Cup;Straw Medication Administration Whole meds with puree Compensations Slow rate;Small sips/bites;Follow solids with liquid Postural Changes Remain semi-upright after after feeds/meals (Comment)   CHL IP OTHER RECOMMENDATIONS 10/31/2019 Recommended Consults -- Oral Care Recommendations Oral care BID Other Recommendations --   CHL IP FOLLOW UP RECOMMENDATIONS 10/31/2019 Follow up Recommendations 24 hour supervision/assistance;Other (comment)   CHL IP FREQUENCY AND DURATION 10/31/2019 Speech Therapy Frequency (ACUTE ONLY) min 3x week Treatment Duration 1 week      CHL IP ORAL PHASE 10/31/2019 Oral Phase Impaired Oral - Pudding Teaspoon -- Oral - Pudding Cup -- Oral - Honey Teaspoon -- Oral - Honey Cup -- Oral - Nectar Teaspoon Weak lingual manipulation;Reduced posterior propulsion;Piecemeal swallowing;Lingual/palatal residue Oral - Nectar Cup Weak lingual manipulation;Reduced posterior propulsion;Lingual/palatal residue;Piecemeal swallowing Oral - Nectar Straw Weak lingual manipulation Oral - Thin Teaspoon Weak lingual manipulation;Reduced posterior propulsion Oral - Thin Cup Weak lingual manipulation;Reduced posterior propulsion Oral - Thin Straw Reduced posterior propulsion;Weak lingual manipulation Oral - Puree Weak lingual manipulation;Reduced posterior propulsion;Delayed oral  transit Oral - Mech Soft Weak lingual manipulation;Reduced posterior propulsion;Impaired mastication;Lingual/palatal residue Oral - Regular -- Oral - Multi-Consistency --  Oral - Pill -- Oral Phase - Comment pt required liquid to orally transit tablet after did not transit with puree  CHL IP PHARYNGEAL PHASE 10/31/2019 Pharyngeal Phase Impaired Pharyngeal- Pudding Teaspoon -- Pharyngeal -- Pharyngeal- Pudding Cup -- Pharyngeal -- Pharyngeal- Honey Teaspoon -- Pharyngeal -- Pharyngeal- Honey Cup -- Pharyngeal -- Pharyngeal- Nectar Teaspoon Reduced tongue base retraction;Pharyngeal residue - valleculae Pharyngeal Material does not enter airway Pharyngeal- Nectar Cup Reduced tongue base retraction;Pharyngeal residue - valleculae Pharyngeal Material does not enter airway Pharyngeal- Nectar Straw WFL Pharyngeal Material does not enter airway Pharyngeal- Thin Teaspoon WFL Pharyngeal Material does not enter airway Pharyngeal- Thin Cup [facility] Pharyngeal Material does not enter airway Pharyngeal- Thin Straw WFL Pharyngeal Material does not enter airway Pharyngeal- Puree Pharyngeal residue - valleculae;Reduced tongue base retraction Pharyngeal Material does not enter airway Pharyngeal- Mechanical Soft Pharyngeal residue - valleculae;Reduced tongue base retraction Pharyngeal Material does not enter airway Pharyngeal- Regular -- Pharyngeal -- Pharyngeal- Multi-consistency -- Pharyngeal -- Pharyngeal- Pill WFL Pharyngeal Material does not enter airway Pharyngeal Comment various postures including head turn left and chin tuck decreased accumulation however after "warm up" swallows, pharyngeal retention was significantly decreased with head neutral and cued effortful swallow  CHL IP CERVICAL ESOPHAGEAL PHASE 10/31/2019 Cervical Esophageal Phase Impaired Pudding Teaspoon -- Pudding Cup -- Honey Teaspoon -- Honey Cup -- Nectar Teaspoon -- Nectar Cup -- Nectar Straw -- Thin Teaspoon -- Thin Cup -- Thin Straw -- Puree -- Mechanical Soft --  Regular -- Multi-consistency -- Pill -- Cervical Esophageal Comment no residuals noted at pyriform sinus or cricopharyngeal area however minimal UES impaired relaxation noted x1 during study with dry swallow Donavan Burnet, MS [facility] SLP Acute Rehab Services Pager [phone] Office [phone] [name] 10/31/2019, 10:10 AM               Anti-infectives: Anti-infectives (From admission, onward)   Start     Dose/Rate Route Frequency Ordered Stop   10/29/19 1600  fluconazole (DIFLUCAN) IVPB 100 mg     100 mg 50 mL/hr over 60 Minutes Intravenous  Once 10/29/19 1510 10/29/19 1750   10/19/19 2200  piperacillin-tazobactam (ZOSYN) IVPB 3.375 g  Status:  Discontinued     3.375 g 12.5 mL/hr over 240 Minutes Intravenous Every 8 hours 10/19/19 1802 10/27/19 0159   10/19/19 1700  vancomycin (VANCOCIN) 1,500 mg in sodium chloride 0.9 % 500 mL IVPB     1,500 mg 250 mL/hr over 120 Minutes Intravenous  Once 10/19/19 1636 10/19/19 1958   10/19/19 1630  ceFEPIme (MAXIPIME) 2 g in sodium chloride 0.9 % 100 mL IVPB     2 g 200 mL/hr over 30 Minutes Intravenous  Once 10/19/19 1629 10/19/19 1911      Assessment/Plan: s/p Procedure(s): sigmoid COLECTOMY WITH COLOSTOMY CREATION/ Advance diet  Parkinson's disease (HCC) Displaced intertrochanteric fracture of left femur, initial encounter for closed fracture (HCC) Chronic systolic CHF (congestive heart failure) (HCC) Dementia without behavioral disturbance (HCC) Sigmoid volvulus (HCC) Hypokalemia Hypotension RUE swelling-DVT studyneg Transaminitis-resolving VDRF- extubated 12/4 Dysphagia-D3 diet, will likely need calorie count.  Can't imagine he is going to take in adequate nutrition given his frail state.  POD14, S/PExploratory laparotomy, sigmoid colectomy, descending colostomy. Dr. Gerrit Friends for Sigmoid Volvuluswith infarction, perforation, and feculent peritonitis -goodostomy output -on D3 diet.  Patient is very  frail.  Nutrition and deconditioning are going to be his hold ups in recovery.   - qshift wet-to-dry dressing changes to midline wound - Protonix daily -pulm toilet and IS -mobilize  as able;PT/OT; anticipate will need SNF/LTAC.  FEN:D3 diet, nutritional supplements VTE: SCD's, lovenox,warfarin has been restarted UJ:WJXBJ:Zosyn 11/27-12/05,afebrile, WBCremains normal Foley:no Follow up:Dr.Gerkin  LOS: 14 days    Chevis Prettyaul Toth III 11/02/2019

## 2019-11-02 NOTE — Progress Notes (Signed)
[name] [name] APP - Progress Note  [name] [name] IHK:[id number] DOB: September 09, 1932 DOA: 10/19/2019 PCP: Crist Infante, MD   Brief narrative: Patient is a 85 year old with multiple medical problems including Parkinson's disease, mitral valve repair with history of endocarditis and mitral valve angioplasty in 2008, paroxysmal A. fib on Coumadin (previously on Eliquis but had inflammatory reaction), prior history of TIA, hyperlipidemia was admitted with abdominal pain, sigmoid volvulus.  He was seen by surgery and GI.  Patient underwent underwent sigmoidoscopy on 11/27 for decompression. On 11/28 become increasingly unstable with elevated lactic acid and shock.  Failed decompression and went to the OR for ex lap, sigmoid colectomy, colostomy creation, was intubated and admitted to ICU.  Patient was under critical care service, transfer to hospitalist service on 10/30/2019.  Significant hospital events 11/27- Admit, underwent flex sig for decompression 11/28- Worsening shock and lactic acidosis. Failed decompression and went to OR for ex-lap, sigmoid colectomy and colostomy creation. Returned to ICU vented 11/29 - Weaning vent settings 12/2 off pressors for > 24 hours. Weaning but not strong enough or awake enough to come off vent. Advancing tube feeds. IV diuresis for third spacing. Still over 13 liters positive. 12/3: Still too weak to extubate 12/4: Extubated 12/8: TRH assumes care    Subjective: Patient seen and examined this morning. His BP has improved with MAP above 65 but it is still low. He continues to have minimum intake per mouth.   Assessment/Plan: Principal Problem:   Acute respiratory failure with hypoxia (HCC) Active Problems:   Parkinson's disease ([place])   Displaced intertrochanteric fracture of left femur, initial encounter for closed fracture ([place])   Chronic systolic CHF (congestive heart failure) (HCC)   Dementia without behavioral disturbance (HCC)   Volvulus of colon  (HCC)   Hypokalemia   Hypotension   Pleural effusion  Principal problem Sigmoid volvulus with perforation and peritonitis, status post sigmoid colectomy, colostomy creation  -Patient presented with sigmoid volvulus, failed flex sig w/ decompression- underwent sigmoid colectomy/colostomy on 11/28-found to have infarction with perforation and feculent peritonitis -General surgery following closely -Continue PPI, pulmonary toilet and IS, mobilize -IV Zosyn completed on 12/4  Mild oral pharyngeal dysphagia -Did not pass swallow evaluation on 12/6.  At baseline he is on thickened liquids - Underwent swallowing evaluation on 12/8 with modified barium swallow study recommended.  This is been completed as of 12/9 with recommendation for dysphagia 3/mechanical soft solids with nectar thick liquids and thin water between meals.  Patient is allowed to utilize straw and can take home meds with pure.  -Encourage oral intake.  -Patient dose not want feeding tube  Acute respiratory failure with hypoxia, small pleural effusion status post mechanical ventilation -Patient was extubated on 12/5 -Continue aspiration and reflux precautions -Received Lasix on 12/7, however BP soft, will not tolerate any diuresis -Mobilize, pulmonary toilet, incentive spirometry -O2 sats 100% on room air  Paroxysmal A. fib with RVR/history of mitral valve repair -Currently heart rate controlled, SBP in 80s on 12/8 therefore metoprolol held -Continue Coumadin -INR supra therapeutic today but no active bleeding. Pharmacy to dose. Monitor for bleeding.  -Systolic mitral murmur on exam  Acute metabolic encephalopathy with history of Parkinson's disease, dementia -Continue amantadine, carbidopa/levodopa, Aricept -Per spouse, does not tolerate benzodiazepines well -Continue delirium prevention interventions, mobilization -Mentation appears to be at baseline.   Hypokalemia Replaced as needed.   Severe protein calorie  malnutrition, severe physical deconditioning -See above regarding initiation of D3 diet -PT OT evaluation, had  plan for SNF but patient and wife now requesting information on inpatient hospice versus home with hospice.   Hypotension In setting of poor intake per mouth. His BP improved starting midodrine but he continues to have poor intake per mouth. Continue IV fluids.   DVT prophylaxis: Warfarin  Code Status:  DNR  Family Communication:  Updated wife at the bedside.   Disposition Plan/Expected LOS: Anticipate eventual DC to home with hospice  Consultants: GI PCCM General surgery  Procedures: 11/28 - sigmoid colectomy with colostomy.  Intubation and extubation   Cultures: MRSA PCR negative Urine culture negative Blood cultures negative  Antibiotics: Anti-infectives (From admission, onward)   Start     Dose/Rate Route Frequency Ordered Stop   10/29/19 1600  fluconazole (DIFLUCAN) IVPB 100 mg     100 mg 50 mL/hr over 60 Minutes Intravenous  Once 10/29/19 1510 10/29/19 1750   10/19/19 2200  piperacillin-tazobactam (ZOSYN) IVPB 3.375 g  Status:  Discontinued     3.375 g 12.5 mL/hr over 240 Minutes Intravenous Every 8 hours 10/19/19 1802 10/27/19 0159   10/19/19 1700  vancomycin (VANCOCIN) 1,500 mg in sodium chloride 0.9 % 500 mL IVPB     1,500 mg 250 mL/hr over 120 Minutes Intravenous  Once 10/19/19 1636 10/19/19 1958   10/19/19 1630  ceFEPIme (MAXIPIME) 2 g in sodium chloride 0.9 % 100 mL IVPB     2 g 200 mL/hr over 30 Minutes Intravenous  Once 10/19/19 1629 10/19/19 1911      Objective: Blood pressure (!) 115/100, pulse 92, temperature (!) 97.5 F (36.4 C), temperature source Oral, resp. rate (!) 28, height '5\' 8"'$  (1.727 m), weight 72.7 kg, SpO2 95 %.  Intake/Output Summary (Last 24 hours) at 11/02/2019 1410 Last data filed at 11/02/2019 1236 Gross per 24 hour  Intake 1911.47 ml  Output 750 ml  Net 1161.47 ml   Estimated body mass index is 24.37 kg/m as  calculated from the following:   Height as of this encounter: '5\' 8"'$  (1.727 m).   Weight as of this encounter: 72.7 kg.   Malnutrition Type:  Nutrition Problem: Increased nutrient needs Etiology: acute illness   Malnutrition Characteristics:  Signs/Symptoms: estimated needs   Nutrition Interventions:  Interventions: Ensure Enlive (each supplement provides 350kcal and 20 grams of protein), Magic cup, MVI-as of 12/9 advancing to D3 diet with nectar thick liquids    Exam: Gen: Sitting in recliner, in NAD Psych: Awake, oriented x name/place Chest: Normal RR, no cough Cardiac: irregularly irregular rate Abdomen: Soft nontender nondistended surgical incision covered by clean dry dressing, left lower quadrant with ostomy and notable liquid stool in bag-stoma pink and nonedematous. GU: Foley catheter with clear yellow colored urine Extremities: Symmetrical in appearance without cyanosis, clubbing or effusion, tremors of bilateral arms.   Scheduled Meds:  Scheduled Meds: . amantadine  100 mg Oral BID  . Carbidopa-Levodopa ER  1 capsule Oral QHS  . Carbidopa-Levodopa ER  2 capsule Oral Daily  . Carbidopa-Levodopa ER  3 capsule Oral 2 times per day  . chlorhexidine gluconate (MEDLINE KIT)  15 mL Mouth Rinse BID  . Chlorhexidine Gluconate Cloth  6 each Topical Daily  . donepezil  10 mg Oral QHS  . escitalopram  10 mg Oral Daily  . feeding supplement  1 Container Oral TID BM  . feeding supplement (PRO-STAT SUGAR FREE 64)  30 mL Oral BID  . midodrine  2.5 mg Oral TID WC  . pantoprazole  40 mg Oral Daily  .  rotigotine  1 patch Transdermal Daily  . sodium chloride flush  10-40 mL Intracatheter Q12H  . Warfarin - Pharmacist Dosing Inpatient   Does not apply q1800   Continuous Infusions: . dextrose 5 % and 0.45% NaCl 75 mL/hr at 11/02/19 1117    Data Reviewed: Basic Metabolic Panel: Recent Labs  Lab 10/28/19 0400 10/29/19 0430 10/30/19 0420 10/31/19 0158 11/01/19 0220  11/02/19 0213  NA 142 141 140 138 140 137  K 3.3* 3.7 3.2* 3.7 3.2* 3.5  CL 105 108 107 106 109 107  CO2 _0 GLUCOSE 118* 98 130* 119* 88 78  BUN 27* 25* 23 25* 21 19  CREATININE 0.89 0.83 0.76 0.73 0.64 0.67  CALCIUM 7.9* 8.0* 7.7* 8.1* 8.0* 8.0*  MG 1.9  --  1.8  --   --   --   PHOS  --   --  2.6  --   --   --    Liver Function Tests: Recent Labs  Lab 10/29/19 0430  AST 35  ALT 7  ALKPHOS 120  BILITOT 2.0*  PROT 5.0*  ALBUMIN 2.3*   No results for input(s): LIPASE, AMYLASE in the last 168 hours. No results for input(s): AMMONIA in the last 168 hours. CBC: Recent Labs  Lab 10/27/19 0325 10/28/19 0400 10/29/19 0430 10/30/19 0420 10/31/19 0158 11/01/19 0220 11/02/19 0213  WBC 8.7 9.2 9.9 9.9 9.7 7.5 6.7  NEUTROABS 7.1 7.7 8.4*  --   --   --   --   HGB 11.4* 11.4* 11.4* 11.5* 11.7* 11.0* 10.6*  HCT 36.3* 35.7* 35.9* 35.7* 36.6* 34.3* 34.0*  MCV 99.5 99.2 99.7 99.2 100.5* 99.7 102.4*  PLT 138* 150 183 211 227 223 253   Cardiac Enzymes: No results for input(s): CKTOTAL, CKMB, CKMBINDEX, TROPONINI in the last 168 hours. BNP (last 3 results) Recent Labs    08/24/19 1206  BNP 670.1*    ProBNP (last 3 results) No results for input(s): PROBNP in the last 8760 hours.  CBG: Recent Labs  Lab 11/01/19 2339 11/02/19 0004 11/02/19 0332 11/02/19 0737 11/02/19 1155  GLUCAP 37* 134* 77 87 95    No results found for this or any previous visit (from the past 240 hour(s)).     Time spent : 35 minutes  Blain Pais, MD **If unable to reach the above provider after paging please contact the [facility] @ [phone]  On-Call/Text Page:      [email or web address]      password [email or web address]  If 7PM-7AM, please contact night-coverage [email or web address] Password [email or web address] 11/02/2019, 2:10 PM   LOS: 14 days

## 2019-11-02 NOTE — Progress Notes (Addendum)
Manufacturing engineer [facility])  Request for discussion of home hospice vs residential hospice by [facility].  Pt spouse considering hospice services at home and was needing further information.  Called wife, left message requesting a return call.  ACC will continue to reach out to see how we can assist.  Thank you, Venia Carbon RN, BSN, [place] (in [place]) [phone]  **wife returned call, lengthy discussion about hospice at home vs residential hospice.  Will evaluate if appropriate for [facility], if not, wife wants to take him home with hospice support.  ACC will update TOC manager and wife tomorrow once [facility] MD determines eligibility

## 2019-11-02 NOTE — Progress Notes (Signed)
Nutrition Follow-up  DOCUMENTATION CODES:   Not applicable  INTERVENTION:  - continue Boost Breeze TID, each supplement provides 250 kcal and 9 grams of protein (be sure to mix with thicken up to make nectar-thick) - continue 30 mL Prostat BID, each supplement provides 100 kcal and 15 grams of protein. - will order Magic Cup BID with meals, each supplement provides 290 kcal and 9 grams of protein. - will order Thicken Up. - continue to encourage PO intakes.   NUTRITION DIAGNOSIS:   Increased nutrient needs related to acute illness as evidenced by estimated needs. -ongoing  GOAL:   Patient will meet greater than or equal to 90% of their needs -unmet  MONITOR:   PO intake, Supplement acceptance, Diet advancement, Labs, Weight trends  ASSESSMENT:   85 year old admitted with abdominal pain, sigmoid volvulus.  Seen by surgery and GIUnderwent sigmoidoscopy on 11/27.  On 11/28 become increasingly unstable with elevated lactic acid and shock.  Significant Events: 11/26- intubation 11/27- sigmoidoscopy 11/28- ex lap, sigmoid colectomy, colostomy creation 11/29- NGT placement 12/4- extubation  12/7- NGT replaced with small bore NGT 12/8- small bore NGT removed 12/9- diet advanced from NPO to Dysphagia 3, nectar-thick   No intakes documented since diet advancement. Flow sheet indicates that patient is a/o to self only. He was very fidgety but kept eyes closed during RD visit. Wife, at bedside, provides all information. Patient does not like the taste of foods he is permitted on current diet order. She feels too that if he could have thin liquids he would drink more and that would then lead to him eating more as some foods, such as mac and cheese, have been too "thick" for him to eat. He likes items such as yogurt and ice cream.   Will communicate with SLP per wife's request. Will continue to monitor abilities concerning oral nutrition. If poor mentation and alertness persists, NGT may  need to be replaced.   Per notes: - good ostomy output - frail patient and it is felt that deconditioning and poor nutrition will delay recovery - likely need for SNF/LTAC on discharge     Labs reviewed; CBGs: 134, 77, and 87 mg/dl, Ca: 8 mg/dl. Medications reviewed; 40 mg oral protonix/day. IVF; D5-1/2 NS @ 75 ml/hr (306 kcal).    Diet Order:   Diet Order            DIET DYS 3 Room service appropriate? Yes; Fluid consistency: Nectar Thick  Diet effective now              EDUCATION NEEDS:   No education needs have been identified at this time  Skin:  Skin Assessment: Reviewed RN Assessment  Last BM:  12/10  Height:   Ht Readings from Last 1 Encounters:  10/20/19 _0  (1.727 m)    Weight:   Wt Readings from Last 1 Encounters:  11/02/19 72.7 kg    Ideal Body Weight:  70 kg  BMI:  Body mass index is 24.37 kg/m.  Estimated Nutritional Needs:   Kcal:  2080-2290 kcal  Protein:  85-100 grams  Fluid:  >/= 2 L/day     Jarome Matin, MS, RD, LDN, [facility] Inpatient Clinical Dietitian Pager # [phone] After hours/weekend pager # [phone]

## 2019-11-02 NOTE — Progress Notes (Signed)
Hypoglycemic Event  CBG: 37  Treatment: 1 amp of d50.  Symptoms: = Asymptomatic    Follow-up CBG: Time: 00:05 CBG Result: 134  Possible Reasons for Event: Low fluid/food  intake.   Comments/MD notified: Dr. Posey Pronto notified.     [name] [name]

## 2019-11-02 NOTE — Progress Notes (Signed)
Physical Therapy Treatment Patient Details Name: [name] [name] MRN: [id number] DOB: 01-03-1932 Today's Date: 11/02/2019    History of Present Illness Pt s/p Exploratory laparotomy, sigmoid colectomy, descending colostomy.  Pt with hx of L hip fx with IM nailing 09/14/19, CVA, Mitral valve replacement, CHF, dementia and Parkinsons    PT Comments    Pt assisted to sitting EOB.  Pt attempting to assist however weak and athetosis observed.  Pt fatigued quickly in sitting and requiring external trunk support.  Continue to recommend higher level of care for rehab (SNF vs LTAC per chart review).   Follow Up Recommendations  SNF     Equipment Recommendations  None recommended by PT    Recommendations for Other Services       Precautions / Restrictions Precautions Precautions: Fall Precaution Comments: abd incision, colostomy Restrictions Weight Bearing Restrictions: No    Mobility  Bed Mobility Overal bed mobility: Needs Assistance Bed Mobility: Sidelying to Sit;Rolling;Sit to Sidelying Rolling: Max assist;+2 for physical assistance Sidelying to sit: Max assist;+2 for physical assistance     Sit to sidelying: Max assist;+2 for physical assistance General bed mobility comments: pt initiating however athetosis movement also limiting;  Transfers                 General transfer comment: not tested, pt fatigued quickly in sitting  Ambulation/Gait                 Stairs             Wheelchair Mobility    Modified Rankin (Stroke Patients Only)       Balance Overall balance assessment: Needs assistance Sitting-balance support: Bilateral upper extremity supported Sitting balance-Leahy Scale: Zero Sitting balance - Comments: pt unable to maintain upright posture with UE support, required mod assist externally                                    Cognition Arousal/Alertness: Awake/alert Behavior During Therapy: Restless Overall Cognitive  Status: Impaired/Different from baseline(unsure of baseline) Area of Impairment: Following commands                       Following Commands: Follows one step commands with increased time       General Comments: some delayed processing, athetosis observed; uncertain of baseline however pt appears to be attempting to communicate and participate      Exercises      General Comments        Pertinent Vitals/Pain Pain Assessment: No/denies pain Pain Intervention(s): Repositioned;Monitored during session    Home Living                      Prior Function            PT Goals (current goals can now be found in the care plan section) Progress towards PT goals: Progressing toward goals    Frequency    Min 2X/week      PT Plan Current plan remains appropriate    Co-evaluation              AM-PAC PT "6 Clicks" Mobility   Outcome Measure  Help needed turning from your back to your side while in a flat bed without using bedrails?: A Lot Help needed moving from lying on your back to sitting on the side of a flat bed without using bedrails?:  A Lot Help needed moving to and from a bed to a chair (including a wheelchair)?: A Lot Help needed standing up from a chair using your arms (e.g., wheelchair or bedside chair)?: Total Help needed to walk in hospital room?: Total Help needed climbing 3-5 steps with a railing? : Total 6 Click Score: 9    End of Session   Activity Tolerance: Patient limited by fatigue Patient left: in bed;with call bell/phone within reach;with bed alarm set   PT Visit Diagnosis: Difficulty in walking, not elsewhere classified (R26.2);History of falling (Z91.81)     Time: 2703-5009 PT Time Calculation (min) (ACUTE ONLY): 15 min  Charges:  $Therapeutic Activity: 8-22 mins                     Paulino Door, DPT Acute Rehabilitation Services Office: [phone]  Abigial Newville,KATHrine E 11/02/2019, 2:18 PM

## 2019-11-02 NOTE — TOC Progression Note (Signed)
Transition of Care [facility]) - Progression Note    Patient Details  Name: [name] [name] MRN: [id number] Date of Birth: 1932-06-14  Transition of Care [facility]) CM/SW [place], LCSW Phone Number: 11/02/2019, 3:41 PM  Clinical Narrative:   CSW received verbal consult to assist patient' wife with taking patient home with hospice. CSW spoke with Shelby via phone. Colletta Maryland stated she still needs time to think about having patient come home with hospice. Colletta Maryland was agreeable to have CSW make a referral to [facility] and to speak with there representative about hospice in the home. CSW spoke to Venia Carbon to make a referral and she stated she will reach out to patients wife to answer any questions she may have about taking patient home with hospice. CSW will continue to follow for support.          Expected Discharge Plan and Services                                                 Social Determinants of Health (SDOH) Interventions    Readmission Risk Interventions Readmission Risk Prevention Plan 10/22/2019  Transportation Screening Complete  PCP or Specialist Appt within 3-5 Days Not Complete  Not Complete comments DC date uknown- pt established with PCP  HRI or [place] Complete  Social Work Consult for [place] Planning/Counseling Complete  Palliative Care Screening Not Complete  Palliative Care Screening Not Complete Comments pending need  Medication Review (RN Transport planner) Referral to Pharmacy  Some recent data might be hidden

## 2019-11-03 LAB — PROTIME-INR
INR: 3.5 — ABNORMAL HIGH (ref 0.8–1.2)
Prothrombin Time: 35.3 seconds — ABNORMAL HIGH (ref 11.4–15.2)

## 2019-11-03 LAB — GLUCOSE, CAPILLARY
Glucose-Capillary: 73 mg/dL (ref 70–99)
Glucose-Capillary: 76 mg/dL (ref 70–99)
Glucose-Capillary: 80 mg/dL (ref 70–99)
Glucose-Capillary: 91 mg/dL (ref 70–99)
Glucose-Capillary: 75 mg/dL (ref 70–99)
Glucose-Capillary: 101 mg/dL — ABNORMAL HIGH (ref 70–99)
Glucose-Capillary: 62 mg/dL — ABNORMAL LOW (ref 70–99)
Glucose-Capillary: 93 mg/dL (ref 70–99)

## 2019-11-03 MED ORDER — QUETIAPINE FUMARATE 50 MG PO TABS
25.0000 mg | ORAL_TABLET | Freq: Once | ORAL | Status: AC
  Administered 2019-11-03: 25 mg via ORAL
  Filled 2019-11-03: qty 1

## 2019-11-03 NOTE — Progress Notes (Signed)
[place] for Warfarin Indication: MV angioplasty  Allergies  Allergen Reactions  . Apixaban Palpitations, Rash and Other (See Comments)    Worsening tardive dyskinesia and rapid heart rate  . Ace Inhibitors Cough  . Demerol [Meperidine] Other (See Comments)    Parkinsons disease  . Poison Ivy Extract Itching, Swelling and Rash   Patient Measurements: Height: 5\' 8"  (172.7 cm) Weight: 160 lb 4.4 oz (72.7 kg) IBW/kg (Calculated) : 68.4  Vital Signs: Temp: 97.7 F (36.5 C) (12/12 0400) Temp Source: Axillary (12/12 0400) BP: 97/54 (12/12 0333) Pulse Rate: 84 (12/12 0333)  Labs: Recent Labs    11/01/19 0220 11/02/19 0213 11/03/19 0154  HGB 11.0* 10.6*  --   HCT 34.3* 34.0*  --   PLT 223 253  --   LABPROT 32.3* 40.2* 35.3*  INR 3.2* 4.2* 3.5*  CREATININE 0.64 0.67  --    Estimated Creatinine Clearance: 65.3 mL/min (by C-G formula based on SCr of 0.67 mg/dL).  Assessment:  51 yoM with sigmoid volvulus perforation > feculent peritonitis, 11/27 sigmoidoscopy, 11/28 colectomy with colostomy.  Intubated 11/28 >> 12/4 extubated, plan resume Warfarin once passes swallow study. INR goal 2-3 per Coumadin clinic notes. PTA Warfarin 2.5mg  daily, LD 11/26   Today, 11/03/2019  INR is 3.5 supra-therapeutic, trending down as pt did not receive dose yesterday  PT is 35.3 sec   H/H stable, Plt now WNL    No bleeding reported  Goal of Therapy:  INR 2-3 Monitor platelets by anticoagulation protocol: Yes   Plan:   No warfarin dose today as INR is elevated   Daily Protime/INR   Monitor for signs and symptoms of bleeding    Royetta Asal, PharmD, BCPS 11/03/2019 7:58 AM

## 2019-11-03 NOTE — Progress Notes (Signed)
  14 Days Post-Op  Subjective: arousable but confused  Objective: Vital signs in last 24 hours: Temp:  [97.5 F (36.4 C)-99.4 F (37.4 C)] 97.7 F (36.5 C) (12/12 0400) Pulse Rate:  [77-106] 84 (12/12 0333) Resp:  [14-28] 21 (12/12 0000) BP: (97-118)/(54-100) 97/54 (12/12 0333) SpO2:  [91 %-100 %] 98 % (12/12 0333) Weight:  [72.7 kg] 72.7 kg (12/12 0500) Last BM Date: 11/02/19  Intake/Output from previous day: 12/11 0701 - 12/12 0700 In: 1250.5 [I.V.:1250.5] Out: 600 [Urine:450; Stool:150] Intake/Output this shift: No intake/output data recorded.  General appearance: cooperative and slowed mentation Resp: clear to auscultation bilaterally Cardio: regular rate and rhythm GI: soft, nontender. ostomy pink and productive  Lab Results:  Recent Labs    11/01/19 0220 11/02/19 0213  WBC 7.5 6.7  HGB 11.0* 10.6*  HCT 34.3* 34.0*  PLT 223 253   BMET Recent Labs    11/01/19 0220 11/02/19 0213  NA 140 137  K 3.2* 3.5  CL 109 107  CO2 24 22  GLUCOSE 88 78  BUN 21 19  CREATININE 0.64 0.67  CALCIUM 8.0* 8.0*   PT/INR Recent Labs    11/02/19 0213 11/03/19 0154  LABPROT 40.2* 35.3*  INR 4.2* 3.5*   ABG No results for input(s): PHART, HCO3 in the last 72 hours.  Invalid input(s): PCO2, PO2  Studies/Results: No results found.  Anti-infectives: Anti-infectives (From admission, onward)   Start     Dose/Rate Route Frequency Ordered Stop   10/29/19 1600  fluconazole (DIFLUCAN) IVPB 100 mg     100 mg 50 mL/hr over 60 Minutes Intravenous  Once 10/29/19 1510 10/29/19 1750   10/19/19 2200  piperacillin-tazobactam (ZOSYN) IVPB 3.375 g  Status:  Discontinued     3.375 g 12.5 mL/hr over 240 Minutes Intravenous Every 8 hours 10/19/19 1802 10/27/19 0159   10/19/19 1700  vancomycin (VANCOCIN) 1,500 mg in sodium chloride 0.9 % 500 mL IVPB     1,500 mg 250 mL/hr over 120 Minutes Intravenous  Once 10/19/19 1636 10/19/19 1958   10/19/19 1630  ceFEPIme (MAXIPIME) 2 g in  sodium chloride 0.9 % 100 mL IVPB     2 g 200 mL/hr over 30 Minutes Intravenous  Once 10/19/19 1629 10/19/19 1911      Assessment/Plan: s/p Procedure(s): sigmoid COLECTOMY WITH COLOSTOMY CREATION/ Dys 3 diet Parkinson's disease ([place]) Displaced intertrochanteric fracture of left femur, initial encounter for closed fracture (HCC) Chronic systolic CHF (congestive heart failure) (HCC) Dementia without behavioral disturbance (HCC) Sigmoid volvulus (HCC) Hypokalemia Hypotension RUE swelling-DVT studyneg Transaminitis-resolving VDRF- extubated 12/4 Dysphagia-D3 diet, will likely need calorie count.  Can't imagine he is going to take in adequate nutrition given his frail state.  POD15, S/PExploratory laparotomy, sigmoid colectomy, descending colostomy. Dr. Harlow Asa for Sigmoid Volvuluswith infarction, perforation, and feculent peritonitis -goodostomy output -on D3 diet.  Patient is very frail.  Malnutrition and deconditioning are going to be his hold ups in recovery.   - qshift wet-to-dry dressing changes to midline wound - Protonix daily -pulm toilet and IS -mobilize as able;PT/OT; anticipate will need SNF/LTAC.  FEN:D3 diet, nutritional supplements VTE: SCD's, lovenox,warfarin has been restarted XT:KWIOX 11/27-12/05,afebrile, WBCremains normal Foley:no Follow up:Dr.Gerkin Family planning on d/c to hospice    LOS: 15 days    [name] [name] 73/53/2992

## 2019-11-03 NOTE — Progress Notes (Addendum)
[facility] Documentation  Pt has been approved for [employer] placement, however, there are curretnly no beds available. Liaison will update [facility] manager once a bed becomes available.   Please reach out with any questions.   Thank you,  Freddie Breech, RN [facility] Liaison [phone]  Liaison informed wife of bed approval at 1135. Wife has liaison's contact information to contact with any questions.

## 2019-11-03 NOTE — Progress Notes (Signed)
[name] [name] APP - Progress Note  [name] [name] DEY:[id number] DOB: [birth year] DOA: 10/19/2019 PCP: Crist Infante, MD   Brief narrative: Patient is a 85 year old with multiple medical problems including Parkinson's disease, mitral valve repair with history of endocarditis and mitral valve angioplasty in 2008, paroxysmal A. fib on Coumadin (previously on Eliquis but had inflammatory reaction), prior history of TIA, hyperlipidemia was admitted with abdominal pain, sigmoid volvulus.  He was seen by surgery and GI.  Patient underwent underwent sigmoidoscopy on 11/27 for decompression. On 11/28 become increasingly unstable with elevated lactic acid and shock.  Failed decompression and went to the OR for ex lap, sigmoid colectomy, colostomy creation, was intubated and admitted to ICU.  Patient was under critical care service, transfer to hospitalist service on 10/30/2019.  Significant hospital events 11/27- Admit, underwent flex sig for decompression 11/28- Worsening shock and lactic acidosis. Failed decompression and went to OR for ex-lap, sigmoid colectomy and colostomy creation. Returned to ICU vented 11/29 - Weaning vent settings 12/2 off pressors for > 24 hours. Weaning but not strong enough or awake enough to come off vent. Advancing tube feeds. IV diuresis for third spacing. Still over 13 liters positive. 12/3: Still too weak to extubate 12/4: Extubated 12/8: TRH assumes care    Subjective: Patient seen and examined this morning. His BP has improved on IV fluids. He remains on D5 infusion due to persistently low CBG last night.   Assessment/Plan: Principal Problem:   Acute respiratory failure with hypoxia (HCC) Active Problems:   Parkinson's disease ([name] [name])   Displaced intertrochanteric fracture of left femur, initial encounter for closed fracture ([name] [name])   Chronic systolic CHF (congestive heart failure) (HCC)   Dementia without behavioral disturbance (HCC)   Volvulus of colon (HCC)  Hypokalemia   Hypotension   Pleural effusion  Principal problem Sigmoid volvulus with perforation and peritonitis, status post sigmoid colectomy, colostomy creation  -Patient presented with sigmoid volvulus, failed flex sig w/ decompression- underwent sigmoid colectomy/colostomy on 11/28-found to have infarction with perforation and feculent peritonitis -General surgery following closely -Continue PPI, pulmonary toilet and IS, mobilize -IV Zosyn completed on 12/4  Mild oral pharyngeal dysphagia -Did not pass swallow evaluation on 12/6.  At baseline he is on thickened liquids - Underwent swallowing evaluation on 12/8 with modified barium swallow study recommended.  This is been completed as of 12/9 with recommendation for dysphagia 3/mechanical soft solids with nectar thick liquids and thin water between meals.  Patient is allowed to utilize straw and can take home meds with pure.  -Encourage oral intake.  -Patient does not want feeding tube  Acute respiratory failure with hypoxia, small pleural effusion status post mechanical ventilation -Patient was extubated on 12/5 -Continue aspiration and reflux precautions -Received Lasix on 12/7, however BP soft, will not tolerate any diuresis -Mobilize, pulmonary toilet, incentive spirometry -O2 sats 100% on room air, using O2 supplementation as needed for comfort.   Paroxysmal A. fib with RVR/history of mitral valve repair -Currently heart rate controlled, SBP in 80s on 12/8 therefore metoprolol held -Continue Coumadin -INR supra therapeutic today but no active bleeding. Pharmacy to dose. Monitor for bleeding.  -Systolic mitral murmur on exam  Acute metabolic encephalopathy with history of Parkinson's disease, dementia -Continue amantadine, carbidopa/levodopa, Aricept -Per spouse, does not tolerate benzodiazepines well -Continue delirium prevention interventions, mobilization -Mentation appears to be at baseline.   Hypokalemia  Replaced as needed.   Severe protein calorie malnutrition, severe physical deconditioning -See above regarding initiation of D3 diet -  PT OT evaluation, had plan for SNF but patient and wife now requesting information on inpatient hospice versus home with hospice. It appears he has been approved for IP hospice but they currently have not beds available. Appreciate CSW help on transfer request to hospice.   Hypotension In setting of poor intake per mouth. His BP improved after starting midodrine and IV fluids. Continue IV fluids.   DVT prophylaxis: Warfarin  Code Status:  DNR  Family Communication:  Updated wife at the bedside.   Disposition Plan/Expected LOS: Anticipate eventual DC to IP hospice  Consultants: GI PCCM General surgery  Procedures: 11/28 - sigmoid colectomy with colostomy.  Intubation and extubation   Cultures: MRSA PCR negative Urine culture negative Blood cultures negative  Antibiotics: Anti-infectives (From admission, onward)   Start     Dose/Rate Route Frequency Ordered Stop   10/29/19 1600  fluconazole (DIFLUCAN) IVPB 100 mg     100 mg 50 mL/hr over 60 Minutes Intravenous  Once 10/29/19 1510 10/29/19 1750   10/19/19 2200  piperacillin-tazobactam (ZOSYN) IVPB 3.375 g  Status:  Discontinued     3.375 g 12.5 mL/hr over 240 Minutes Intravenous Every 8 hours 10/19/19 1802 10/27/19 0159   10/19/19 1700  vancomycin (VANCOCIN) 1,500 mg in sodium chloride 0.9 % 500 mL IVPB     1,500 mg 250 mL/hr over 120 Minutes Intravenous  Once 10/19/19 1636 10/19/19 1958   10/19/19 1630  ceFEPIme (MAXIPIME) 2 g in sodium chloride 0.9 % 100 mL IVPB     2 g 200 mL/hr over 30 Minutes Intravenous  Once 10/19/19 1629 10/19/19 1911      Objective: Blood pressure (!) 97/54, pulse 84, temperature 98.5 F (36.9 C), resp. rate (!) 21, height 5' 8" (1.727 m), weight 72.7 kg, SpO2 98 %.  Intake/Output Summary (Last 24 hours) at 11/03/2019 1832 Last data filed at 11/03/2019  0330 Gross per 24 hour  Intake 854.18 ml  Output 500 ml  Net 354.18 ml   Estimated body mass index is 24.37 kg/m as calculated from the following:   Height as of this encounter: 5' 8" (1.727 m).   Weight as of this encounter: 72.7 kg.   Malnutrition Type:  Nutrition Problem: Increased nutrient needs Etiology: acute illness   Malnutrition Characteristics:  Signs/Symptoms: estimated needs   Nutrition Interventions:  Interventions: Ensure Enlive (each supplement provides 350kcal and 20 grams of protein), Magic cup, MVI-as of 12/9 advancing to D3 diet with nectar thick liquids    Exam: Gen: Sitting in recliner, in NAD Psych: Awake, oriented x name/place Chest: Normal RR, no cough Cardiac: irregularly irregular rate Abdomen: Soft nontender nondistended surgical incision covered by clean dry dressing, left lower quadrant with ostomy and notable liquid stool in bag-stoma pink and nonedematous. GU: Foley catheter with clear yellow colored urine Extremities: Symmetrical in appearance without cyanosis, clubbing or effusion, tremors of bilateral arms.   Scheduled Meds:  Scheduled Meds: . amantadine  100 mg Oral BID  . Carbidopa-Levodopa ER  1 capsule Oral QHS  . Carbidopa-Levodopa ER  2 capsule Oral Daily  . Carbidopa-Levodopa ER  3 capsule Oral 2 times per day  . chlorhexidine gluconate (MEDLINE KIT)  15 mL Mouth Rinse BID  . Chlorhexidine Gluconate Cloth  6 each Topical Daily  . donepezil  10 mg Oral QHS  . escitalopram  10 mg Oral Daily  . feeding supplement  1 Container Oral TID BM  . feeding supplement (PRO-STAT SUGAR FREE 64)  30 mL Oral BID  .  midodrine  2.5 mg Oral TID WC  . pantoprazole  40 mg Oral Daily  . rotigotine  1 patch Transdermal Daily  . sodium chloride flush  10-40 mL Intracatheter Q12H  . Warfarin - Pharmacist Dosing Inpatient   Does not apply q1800   Continuous Infusions: . dextrose 5 % and 0.45% NaCl 75 mL/hr at 11/03/19 0939    Data Reviewed:  Basic Metabolic Panel: Recent Labs  Lab 10/28/19 0400 10/29/19 0430 10/30/19 0420 10/31/19 0158 11/01/19 0220 11/02/19 0213  NA 142 141 140 138 140 137  K 3.3* 3.7 3.2* 3.7 3.2* 3.5  CL 105 108 107 106 109 107  CO2 _0 GLUCOSE 118* 98 130* 119* 88 78  BUN 27* 25* 23 25* 21 19  CREATININE 0.89 0.83 0.76 0.73 0.64 0.67  CALCIUM 7.9* 8.0* 7.7* 8.1* 8.0* 8.0*  MG 1.9  --  1.8  --   --   --   PHOS  --   --  2.6  --   --   --    Liver Function Tests: Recent Labs  Lab 10/29/19 0430  AST 35  ALT 7  ALKPHOS 120  BILITOT 2.0*  PROT 5.0*  ALBUMIN 2.3*   No results for input(s): LIPASE, AMYLASE in the last 168 hours. No results for input(s): AMMONIA in the last 168 hours. CBC: Recent Labs  Lab 10/28/19 0400 10/29/19 0430 10/30/19 0420 10/31/19 0158 11/01/19 0220 11/02/19 0213  WBC 9.2 9.9 9.9 9.7 7.5 6.7  NEUTROABS 7.7 8.4*  --   --   --   --   HGB 11.4* 11.4* 11.5* 11.7* 11.0* 10.6*  HCT 35.7* 35.9* 35.7* 36.6* 34.3* 34.0*  MCV 99.2 99.7 99.2 100.5* 99.7 102.4*  PLT 150 183 211 227 223 253   Cardiac Enzymes: No results for input(s): CKTOTAL, CKMB, CKMBINDEX, TROPONINI in the last 168 hours. BNP (last 3 results) Recent Labs    08/24/19 1206  BNP 670.1*    ProBNP (last 3 results) No results for input(s): PROBNP in the last 8760 hours.  CBG: Recent Labs  Lab 11/02/19 1657 11/02/19 2007 11/02/19 2350 11/03/19 0434 11/03/19 0741  GLUCAP 80 91 76 73 75    No results found for this or any previous visit (from the past 240 hour(s)).     Time spent : 25 minutes  Blain Pais, MD **If unable to reach the above provider after paging please contact the [facility] @ [phone]  On-Call/Text Page:      [email or web address]      password [email or web address]  If 7PM-7AM, please contact night-coverage [email or web address] Password [email or web address] 11/03/2019, 6:32 PM   LOS: 15 days

## 2019-11-04 LAB — GLUCOSE, CAPILLARY
Glucose-Capillary: 49 mg/dL — ABNORMAL LOW (ref 70–99)
Glucose-Capillary: 75 mg/dL (ref 70–99)
Glucose-Capillary: 79 mg/dL (ref 70–99)
Glucose-Capillary: 81 mg/dL (ref 70–99)
Glucose-Capillary: 89 mg/dL (ref 70–99)
Glucose-Capillary: 55 mg/dL — ABNORMAL LOW (ref 70–99)
Glucose-Capillary: 123 mg/dL — ABNORMAL HIGH (ref 70–99)

## 2019-11-04 LAB — PROTIME-INR
INR: 4.1 (ref 0.8–1.2)
Prothrombin Time: 39.6 seconds — ABNORMAL HIGH (ref 11.4–15.2)

## 2019-11-04 MED ORDER — DEXTROSE 50 % IV SOLN: 25.0000 g | INTRAVENOUS | Status: AC

## 2019-11-04 MED ORDER — DEXTROSE 50 % IV SOLN
INTRAVENOUS | Status: AC
  Administered 2019-11-04: 50 mL via INTRAVENOUS
  Filled 2019-11-04: qty 50

## 2019-11-04 MED ORDER — DEXTROSE 50 % IV SOLN
INTRAVENOUS | Status: AC
  Administered 2019-11-04: 12.5 g via INTRAVENOUS
  Filled 2019-11-04: qty 50

## 2019-11-04 MED ORDER — DEXTROSE 50 % IV SOLN: 12.5000 g | INTRAVENOUS | Status: AC

## 2019-11-04 NOTE — Progress Notes (Signed)
CRITICAL VALUE ALERT  Critical Value: INR- 4.1 CBG- 75  Date & Time Notied:  11/04/2019    0430  Provider Notified: [place] notified  Orders Received/Actions taken: CBG's ordered Q 2

## 2019-11-04 NOTE — Progress Notes (Signed)
  15 Days Post-Op  Subjective: arousable but confused  Objective: Vital signs in last 24 hours: Temp:  [97.5 F (36.4 C)-98.9 F (37.2 C)] 97.8 F (36.6 C) (12/13 0400) Pulse Rate:  [57-88] 84 (12/13 0438) Resp:  [17-25] 25 (12/13 0438) BP: (84-103)/(44-61) 95/61 (12/13 0425) SpO2:  [89 %-95 %] 95 % (12/13 0438) Weight:  [74.9 kg] 74.9 kg (12/13 0500) Last BM Date: 11/02/19  Intake/Output from previous day: 12/12 0701 - 12/13 0700 In: 2068.4 [I.V.:2068.4] Out: 400 [Urine:250; Stool:150] Intake/Output this shift: No intake/output data recorded.  General appearance: cooperative and slowed mentation Resp: clear to auscultation bilaterally Cardio: regular rate and rhythm GI: soft, nontender. ostomy pink and productive  Lab Results:  Recent Labs    11/02/19 0213  WBC 6.7  HGB 10.6*  HCT 34.0*  PLT 253   BMET Recent Labs    11/02/19 0213  NA 137  K 3.5  CL 107  CO2 22  GLUCOSE 78  BUN 19  CREATININE 0.67  CALCIUM 8.0*   PT/INR Recent Labs    11/03/19 0154 11/04/19 0209  LABPROT 35.3* 39.6*  INR 3.5* 4.1*   ABG No results for input(s): PHART, HCO3 in the last 72 hours.  Invalid input(s): PCO2, PO2  Studies/Results: No results found.  Anti-infectives: Anti-infectives (From admission, onward)   Start     Dose/Rate Route Frequency Ordered Stop   10/29/19 1600  fluconazole (DIFLUCAN) IVPB 100 mg     100 mg 50 mL/hr over 60 Minutes Intravenous  Once 10/29/19 1510 10/29/19 1750   10/19/19 2200  piperacillin-tazobactam (ZOSYN) IVPB 3.375 g  Status:  Discontinued     3.375 g 12.5 mL/hr over 240 Minutes Intravenous Every 8 hours 10/19/19 1802 10/27/19 0159   10/19/19 1700  vancomycin (VANCOCIN) 1,500 mg in sodium chloride 0.9 % 500 mL IVPB     1,500 mg 250 mL/hr over 120 Minutes Intravenous  Once 10/19/19 1636 10/19/19 1958   10/19/19 1630  ceFEPIme (MAXIPIME) 2 g in sodium chloride 0.9 % 100 mL IVPB     2 g 200 mL/hr over 30 Minutes Intravenous  Once  10/19/19 1629 10/19/19 1911      Assessment/Plan: s/p Procedure(s): sigmoid COLECTOMY WITH COLOSTOMY CREATION/ Dys 3 diet Parkinson's disease ([place]) Displaced intertrochanteric fracture of left femur, initial encounter for closed fracture (HCC) Chronic systolic CHF (congestive heart failure) (HCC) Dementia without behavioral disturbance (HCC) Sigmoid volvulus (HCC) Hypokalemia Hypotension RUE swelling-DVT studyneg Transaminitis-resolving VDRF- extubated 12/4 Dysphagia-D3 diet, will likely need calorie count.  Can't imagine he is going to take in adequate nutrition given his frail state.  POD16, S/PExploratory laparotomy, sigmoid colectomy, descending colostomy. Dr. Harlow Asa for Sigmoid Volvuluswith infarction, perforation, and feculent peritonitis -goodostomy output -on D3 diet.  Patient is very frail.  Malnutrition and deconditioning are going to be his hold ups in recovery.   - qshift wet-to-dry dressing changes to midline wound - Protonix daily -pulm toilet and IS -mobilize as able;PT/OT; anticipate will need SNF/LTAC.  FEN:D3 diet, nutritional supplements VTE: SCD's, lovenox,warfarin has been restarted ZO:XWRUE 11/27-12/05,afebrile, WBCremains normal Foley:no Follow up:Dr.Gerkin Family planning on d/c to hospice.  Awaiting bed at [facility]   LOS: 16 days    [name] [name] 45/[id number]/9811

## 2019-11-04 NOTE — Progress Notes (Signed)
[place] for Warfarin Indication: MV angioplasty  Allergies  Allergen Reactions  . Apixaban Palpitations, Rash and Other (See Comments)    Worsening tardive dyskinesia and rapid heart rate  . Ace Inhibitors Cough  . Demerol [Meperidine] Other (See Comments)    Parkinsons disease  . Poison Ivy Extract Itching, Swelling and Rash   Patient Measurements: Height: 5\' 8"  (172.7 cm) Weight: 165 lb 2 oz (74.9 kg) IBW/kg (Calculated) : 68.4  Vital Signs: Temp: 97.8 F (36.6 C) (12/13 0400) Temp Source: Axillary (12/13 0400) BP: 95/61 (12/13 0425) Pulse Rate: 84 (12/13 0438)  Labs: Recent Labs    11/02/19 0213 11/03/19 0154 11/04/19 0209  HGB 10.6*  --   --   HCT 34.0*  --   --   PLT 253  --   --   LABPROT 40.2* 35.3* 39.6*  INR 4.2* 3.5* 4.1*  CREATININE 0.67  --   --    Estimated Creatinine Clearance: 65.3 mL/min (by C-G formula based on SCr of 0.67 mg/dL).  Assessment:  85 yoM with sigmoid volvulus perforation > feculent peritonitis, 11/27 sigmoidoscopy, 11/28 colectomy with colostomy.  Intubated 11/28 >> 12/4 extubated, plan resume Warfarin once passes swallow study. INR goal 2-3 per Coumadin clinic notes. PTA Warfarin 2.5mg  daily, LD 11/26   Today, 11/04/2019  INR is 4.1 supra-therapeutic,  pt did not receive dose since 12/10   PT is 39.6 sec   H/H stable, Plt now WNL    No bleeding reported  Goal of Therapy:  INR 2-3 Monitor platelets by anticoagulation protocol: Yes   Plan:   No warfarin dose today as INR is elevated   Daily Protime/INR   Monitor for signs and symptoms of bleeding    Royetta Asal, PharmD, BCPS 11/04/2019 7:24 AM

## 2019-11-04 NOTE — TOC Transition Note (Signed)
Transition of Care [facility]) - CM/SW Discharge Note   Patient Details  Name: [name] [name] MRN: [id number] Date of Birth: Oct 31, 1932  Transition of Care [facility] CM/SW Contact:  Servando Snare, LCSW Phone Number: 11/04/2019, 12:48 PM   Clinical Narrative:   Patient will transfer to [facility] place. LCSW faxed dc docs to facility. RN report #:[phone]          Patient Goals and CMS Choice        Discharge Placement                       Discharge Plan and Services                                     Social Determinants of Health (SDOH) Interventions     Readmission Risk Interventions Readmission Risk Prevention Plan 10/22/2019  Transportation Screening Complete  PCP or Specialist Appt within 3-5 Days Not Complete  Not Complete comments DC date uknown- pt established with PCP  HRI or [place] Complete  Social Work Consult for [place] Planning/Counseling Complete  Palliative Care Screening Not Complete  Palliative Care Screening Not Complete Comments pending need  Medication Review (RN Transport planner) Referral to Pharmacy  Some recent data might be hidden

## 2019-11-04 NOTE — Progress Notes (Signed)
[facility] Documentation  Pt has been approved for [employer] [facility] [employer] transfer and all paperwork has been completed. [facility] does have a bed available for pt today if pt is able to discharge. Hospital to arrange transportation to [facility].   Please call [place] at [phone] to give charge nurse report and fax discharge summary to [phone].   Please dc any lines. May leave catheter in place if pt has one. Please send pt to [facility] with DNR paperwork.    Please call with any questions.    Thank you,  Freddie Breech, RN [facility] Liaison  [phone]

## 2019-11-04 NOTE — Discharge Summary (Signed)
Physician Discharge Summary  [name] [name] WUJ:[id number] DOB: 12/05/1931 DOA: 10/19/2019  PCP: Rodrigo Ran, MD  Admit date: 10/19/2019 Discharge date: 11/04/2019  Admitted From: Home Disposition:  [facility]  Recommendations for Outpatient Follow-up: Continue hospice care Home Health:No  Equipment/Devices: no  Discharge Condition: hospice CODE STATUS: DNR Diet recommendation: pureed, comfort feeds    Brief/Interim Summary: Patient is an 85 year oldwith multiple medical problems including Parkinson's disease, mitral valve repair with history of endocarditis and mitral valve angioplasty in 2008, paroxysmal A. fib on Coumadin(previously on Eliquis but had inflammatory reaction),prior history of TIA, hyperlipidemia wasadmitted with abdominal pain, sigmoid volvulus. He was seen by surgery and GI. Patient underwent underwent sigmoidoscopy on 11/27for decompression. On 11/28 become increasingly unstable with elevated lactic acid and shock.Failed decompression and went to the OR for ex lap, sigmoid colectomy, colostomy creation, was intubated and admitted to ICU. Patient was under critical care service, transfer to hospitalist service on 10/30/2019.  Significant hospital events 11/27- Admit, underwent flex sig for decompression 11/28- Worsening shock and lactic acidosis. Failed decompression and went to OR for ex-lap, sigmoid colectomy and colostomy creation. Returned to ICU vented 11/29 - Weaning vent settings 12/2 off pressors for > 24 hours. Weaning but not strong enough or awake enough to come off vent. Advancing tube feeds. IV diuresis for third spacing. Still over 13 liters positive. 12/3: Still too weak to extubate 12/4: Extubated 12/8: [facility] assumes care   Discharge Diagnoses:  Principal Problem:   Acute respiratory failure with hypoxia (HCC) Active Problems:   Parkinson's disease (HCC)   Displaced intertrochanteric fracture of left femur, initial encounter for closed  fracture (HCC)   Chronic systolic CHF (congestive heart failure) (HCC)   Dementia without behavioral disturbance (HCC)   Volvulus of colon (HCC)   Hypokalemia   Hypotension   Pleural effusion  Sigmoid volvulus with perforation and peritonitis, status post sigmoid colectomy, colostomy creation -Patient presented with sigmoid volvulus, failed flex sig w/ decompression- underwent sigmoid colectomy/colostomy on 11/28-found to have infarction with perforation and feculent peritonitis -General surgery followed closely -Continue PPI, pulmonary toilet and IS, mobilize -IV Zosyn completed on 12/4  Mild oral pharyngeal dysphagia -Did not pass swallow evaluation on 12/6. At baseline he ison thickened liquids -Underwent swallowing evaluation on 12/8 with modified barium swallow study recommended.  This is been completed as of 12/9 with recommendation for dysphagia 3/mechanical soft solids with nectar thick liquids and thin water between meals.  Patient is allowed to utilize straw and can take home meds with pure.  -Encourage oral intake.  -Patient does not want feeding tube.  -he is being transferred to inpatient hospice.   Acute respiratory failure with hypoxia, small pleural effusion status post mechanical ventilation -Patient was extubated on 12/5 -Continue aspiration and reflux precautions -Received Lasix on 12/7, however BP soft, will not tolerate any diuresis -Mobilize, pulmonary toilet, incentive spirometry -O2 sats 100% on room air, using O2 supplementation as needed for comfort.   Paroxysmal A. fib with RVR/history of mitral valve repair -Currently heart rate controlled, SBP in 80s on 12/8 therefore metoprolol held -Continue Coumadin -INR supra therapeutic today but no active bleeding. Pharmacy to dose. Monitor for bleeding.  -Systolic mitral murmur on exam  Acute metabolic encephalopathy with history of Parkinson's disease, dementia -Continue amantadine, carbidopa/levodopa,  Aricept -Per spouse, does not tolerate benzodiazepines well -Continue delirium prevention interventions, mobilization -Mentation appears to be at baseline.   Hypokalemia Replaced as needed.   Severe protein calorie malnutrition, severe physical deconditioning -See above regarding  initiation of D3 diet -PT OT evaluation, had plan for SNF but patient and wife requested hospice evaluation given his slow recovery and persistent dysphagia. -Patient being transferred to IP hospicel  Hypotension In setting of poor intake per mouth. His BP improved after starting midodrine and IV fluids. We continued his IV fluids.   Hypoglycemia:  Persistent, in setting of poor po intake due to dysphagia.  He remained on D51/2NS infusion. Continue comfort feeds after discharge.    Discharge Instructions  Discharge Instructions    Diet - low sodium heart healthy   Complete by: As directed    Pureed diet, comfort feeds   Increase activity slowly   Complete by: As directed      Allergies as of 11/04/2019      Reactions   Apixaban Palpitations, Rash, Other (See Comments)   Worsening tardive dyskinesia and rapid heart rate   Ace Inhibitors Cough   Demerol [meperidine] Other (See Comments)   Parkinsons disease   Poison Ivy Extract Itching, Swelling, Rash      Medication List    TAKE these medications   acetaminophen 325 MG tablet Commonly known as: TYLENOL Take 2 tablets (650 mg total) by mouth every 6 (six) hours. What changed:   when to take this  reasons to take this   amantadine 100 MG capsule Commonly known as: SYMMETREL Take 100 mg by mouth See admin instructions. Take 100 mg by mouth at 1 PM and 100 mg at 6 PM   atorvastatin 20 MG tablet Commonly known as: LIPITOR Take 20 mg by mouth every evening.   bisacodyl 5 MG EC tablet Commonly known as: DULCOLAX Take 1 tablet (5 mg total) by mouth daily as needed for moderate constipation.   clonazePAM 0.25 MG disintegrating  tablet Commonly known as: KLONOPIN Take 0.25 mg by mouth 2 (two) times daily.   docusate sodium 100 MG capsule Commonly known as: COLACE Take 100 mg by mouth daily as needed for mild constipation.   donepezil 10 MG tablet Commonly known as: ARICEPT Take 10 mg by mouth at bedtime.   escitalopram 10 MG tablet Commonly known as: LEXAPRO Take 10 mg by mouth daily.   furosemide 20 MG tablet Commonly known as: LASIX Take 0.5-1 tablets (10-20 mg total) by mouth See admin instructions. Take 10 mg by mouth once a day, alternating with 20 mg every other day What changed:   how much to take  when to take this  additional instructions   metoprolol succinate 25 MG 24 hr tablet Commonly known as: TOPROL-XL Take 12.5 mg by mouth daily.   MULTIVITAMIN PO Take 1 tablet by mouth daily.   rotigotine 4 MG/24HR Commonly known as: NEUPRO Place 1 patch onto the skin daily.   Rytary 48.75-195 MG Cpcr Generic drug: Carbidopa-Levodopa ER Take 1-3 capsules by mouth See admin instructions. Take 3 capsules by mouth in the morning, 3 capsules between noon-1 PM, 2 capsules at 6 PM, and 1 capsule at bedtime   selegiline 5 MG tablet Commonly known as: ELDEPRYL Take 2.5-5 mg by mouth See admin instructions. Take 5 mg by mouth in the morning and 2.5 mg at 4 PM   vitamin B-12 1000 MCG tablet Commonly known as: CYANOCOBALAMIN Take 1,000 mcg by mouth 3 (three) times a week.   Vitamin D-3 25 MCG (1000 UT) Caps Take 1,000 Units by mouth daily.   vitamin E 400 UNIT capsule Take 400 Units by mouth daily.   warfarin 5 MG tablet Commonly  known as: COUMADIN Take as directed. If you are unsure how to take this medication, talk to your nurse or doctor. Original instructions: Take 1 tablet (5 mg total) by mouth daily. What changed:   how much to take  when to take this       Allergies  Allergen Reactions  . Apixaban Palpitations, Rash and Other (See Comments)    Worsening tardive dyskinesia  and rapid heart rate  . Ace Inhibitors Cough  . Demerol [Meperidine] Other (See Comments)    Parkinsons disease  . Poison Ivy Extract Itching, Swelling and Rash    Consultations:  Surgery Critical Care Medicine  Procedures/Studies: DG Abd 1 View  Result Date: 10/20/2019 CLINICAL DATA:  OG tube placement EXAM: ABDOMEN - 1 VIEW COMPARISON:  10/20/2019 FINDINGS: OG tube tip is in the proximal stomach near the GE junction. The side port is in the distal esophagus. Dilated bowel loops in the upper abdomen, decreased since prior study. IMPRESSION: OG tube tip in the proximal stomach near the GE junction with the side port in the distal esophagus. Electronically Signed   By: Charlett Nose M.D.   On: 10/20/2019 20:56   DG Abd 1 View  Result Date: 10/20/2019 CLINICAL DATA:  Volvulus of colon. EXAM: ABDOMEN - 1 VIEW COMPARISON:  CT abdomen dated 10/19/2019. FINDINGS: Persistent gaseous distention of the distal colon, but at least mildly improved compared to the dilatation demonstrated on yesterday's abdomen and pelvis CT. Stool is present within the more proximal colon. No evidence of free intraperitoneal air is seen, although characterization is limited by the supine patient positioning. IMPRESSION: 1. Persistent gaseous distention of the sigmoid colon, but at least mildly improved compared to the dilatation demonstrated on yesterday's CT status post interval flexible sigmoidoscopy. 2. No obvious evidence of free intraperitoneal air, but characterization is limited by supine patient positioning and incomplete visualization of the upper abdomen. Electronically Signed   By: Bary Richard M.D.   On: 10/20/2019 05:01   CT ABDOMEN PELVIS W CONTRAST  Result Date: 10/19/2019 CLINICAL DATA:  Abdominal pain that started around 6 o'clock today. EXAM: CT ABDOMEN AND PELVIS WITH CONTRAST TECHNIQUE: Multidetector CT imaging of the abdomen and pelvis was performed using the standard protocol following bolus  administration of intravenous contrast. CONTRAST:  OMNIPAQUE IOHEXOL 300 MG/ML  SOLN COMPARISON:  None FINDINGS: Lower chest: Signs of mitral valve replacement. Cardiac enlargement. Heart is incompletely imaged. Basilar airspace disease with moderate right and small left pleural effusion. Hepatobiliary: Limited assessment due to arm position, technical factors and edema, no gross abnormality. Pancreas: Also with some limited assessment on today's study. Grossly normal. Spleen: Normal in size without focal abnormality. Adrenals/Urinary Tract: Normal adrenal glands. Marked renal cortical scarring right worse than left. No signs of hydronephrosis. Small left renal cyst. Stomach/Bowel: Bowel is difficult to follow throughout the abdomen. The colon is markedly distended, particularly the sigmoid colon. There is a twist in the sigmoid colon with configuration that is compatible with sigmoid volvulus. Bowel edema is present. No signs of pneumatosis at the current time. Small volume ascites. Other bowel loops are difficult to follow as described. Vascular/Lymphatic: Patent abdominal vasculature. No signs of adenopathy in the abdomen or pelvis. Reproductive: Limited assessment of the prostate and pelvic structures secondary to streak artifact from previous ORIF of a left proximal femur fracture. Other: Bowel edema. No current signs of free air. Small volume ascites. Musculoskeletal: No signs of acute or destructive bone process. Postoperative changes related to intertrochanteric  fracture ORIF with femoral nailing and hip screw placement partially visualized. Osteopenia. IMPRESSION: 1. Signs of sigmoid volvulus with marked distension of the sigmoid colon. Small volume ascites, no free air or pneumatosis. Surgical and or GI consultation is suggested for consideration of reduction. 2. Basilar airspace disease with moderate effusion, raise the question of pneumonia or aspiration at the right lung base. Small left effusion  as well. 3. Cardiac enlargement. 4. Marked renal cortical scarring right worse than left. 5. Osteopenia. 6. Postoperative changes related to previous ORIF of a left proximal femur fracture. 7. These results were called by telephone at the time of interpretation on 10/19/2019 at 3:34 pm to provider [facility] , who verbally acknowledged these results. Electronically Signed   By: Donzetta Kohut M.D.   On: 10/19/2019 15:36   DG Chest Port 1 View  Result Date: 10/27/2019 CLINICAL DATA:  Acute respiratory failure with hypoxia EXAM: PORTABLE CHEST 1 VIEW COMPARISON:  Radiograph 10/26/2019 FINDINGS: Interval extubation. NG tube remains in stomach. Sternotomy wires overlie stable cardiac silhouette. There bilateral pleural effusions unchanged. No pulmonary edema. IMPRESSION: 1. Interval extubation without complication. 2. No change in bilateral pleural effusions. Electronically Signed   By: Genevive Bi M.D.   On: 10/27/2019 07:28   DG Chest Port 1 View  Result Date: 10/26/2019 CLINICAL DATA:  Pleural effusion shock, recent sigmoid volvulus and sigmoidoscopy EXAM: PORTABLE CHEST 1 VIEW COMPARISON:  Chest radiograph from one day prior. FINDINGS: Endotracheal tube tip is 4.7 cm above the carina. Enteric tube enters stomach with the tip not seen on this image. Left subclavian central venous catheter terminates in the left brachiocephalic vein just to the right of midline. Intact sternotomy wires. Loop recorder overlies the left chest. Mitral annuloplasty ring in place. Stable cardiomediastinal silhouette with mild cardiomegaly. No pneumothorax. Small bilateral pleural effusions, right greater than left, stable with stable bibasilar atelectasis. No overt pulmonary edema. IMPRESSION: 1. Well-positioned support structures.  No pneumothorax. 2. Small bilateral pleural effusions with bibasilar atelectasis, right greater than left, stable. 3. Stable cardiomegaly without overt pulmonary edema. Electronically Signed    By: Delbert Phenix M.D.   On: 10/26/2019 09:26   DG Chest Port 1 View  Result Date: 10/25/2019 CLINICAL DATA:  Acute respiratory failure/hypoxia. EXAM: PORTABLE CHEST 1 VIEW COMPARISON:  10/22/2019 FINDINGS: Endotracheal tube has tip 4.8 cm above the carina. Enteric tube has tip and side-port over the stomach in the left upper quadrant. Left subclavian central venous catheter unchanged with tip over the SVC. Loop recorder projects over the left are unchanged. Lungs are adequately inflated and demonstrate persistent hazy opacification over the right mid to lower lung and left base likely layering effusions with associated atelectasis. Infection in the lung bases is possible. No pneumothorax. Mild stable cardiomegaly. Remainder of the exam is unchanged. IMPRESSION: 1. Stable bilateral pleural effusions right greater than left likely with associated atelectasis. Infection in the lung bases is possible. 2.  Tubes and lines as described. Electronically Signed   By: Elberta Fortis M.D.   On: 10/25/2019 09:54   DG CHEST PORT 1 VIEW  Result Date: 10/22/2019 CLINICAL DATA:  NG tube placement. EXAM: PORTABLE CHEST 1 VIEW COMPARISON:  08/20/2019 FINDINGS: The NG tube tip remains in the region of the gastroesophageal junction. Persistent cardiomegaly. Persistent bilateral effusions, slightly increased on the left. Pulmonary vascularity is normal. Endotracheal tube in good position. IMPRESSION: 1. Persistent bilateral pleural effusions, slightly increased on the left. 2. No change in the position of the nasogastric  tube with the tip at the GE junction. 3. Persistent cardiomegaly. Electronically Signed   By: Francene Boyers M.D.   On: 10/22/2019 12:15   DG CHEST PORT 1 VIEW  Result Date: 10/20/2019 CLINICAL DATA:  Central line placement EXAM: PORTABLE CHEST 1 VIEW COMPARISON:  Radiograph 10/19/2019 FINDINGS: Endotracheal tube appropriately positioned within the mid trachea, 4.4 cm from the carina. Transesophageal tube  tip is positioned at the level of the GE junction. Should be advanced at least 10 cm to position the side port beyond the GE junction for optimal functioning. Left subclavian line terminates in the brachiocephalic vein. Loop recorder again projects over the heart. Post sternotomy changes with evidence of mitral valve replacement with stable cardiomegaly. Bibasilar areas of consolidative opacity more pronounced in the right lung base. Obscuration of the right hemidiaphragm likely reflecting a layering effusion. Degenerative changes are present in the imaged spine and shoulders. No acute osseous or soft tissue abnormality. IMPRESSION: 1. Transesophageal tube tip is positioned at the level of the GE junction. Should be advanced at least 10 cm to position the GE junction for optimal functioning. 2. Satisfactory positioning of the endotracheal tube in the mid trachea. 3. Left subclavian line terminates in the brachiocephalic vein, consider advancing centrally. 4. Bibasilar areas of consolidative opacity, more pronounced in the right lung base, consistent with pneumonia. 5. Likely layering right effusion. These results will be called to the ordering clinician or representative by the Radiologist Assistant, and communication documented in the PACS or zVision Dashboard. Electronically Signed   By: Kreg Shropshire M.D.   On: 10/20/2019 21:00   DG Chest Portable 1 View  Result Date: 10/19/2019 CLINICAL DATA:  Possible pneumonia on CT EXAM: PORTABLE CHEST 1 VIEW COMPARISON:  Portable exam 1603 hours compared to 03/05/2015 FINDINGS: Enlargement of cardiac silhouette post MVR. Loop recorder projects over chest. Stable mediastinal contours. Bibasilar infiltrates greater on RIGHT consistent with pneumonia. No pleural effusion or pneumothorax. Bowel interposition between liver and diaphragm. Bones demineralized with chronic LEFT rotator cuff tear. IMPRESSION: Enlargement of cardiac silhouette post MVR. Bibasilar infiltrate  particularly on RIGHT consistent with pneumonia. Electronically Signed   By: Ulyses Southward M.D.   On: 10/19/2019 16:12   DG Abd Portable 1V  Result Date: 10/29/2019 CLINICAL DATA:  Feeding tube placement. EXAM: PORTABLE ABDOMEN - 1 VIEW COMPARISON:  Radiograph dated 10/22/2019 FINDINGS: Feeding tube tip is in the body of the stomach. No dilated bowel loops. Bilateral pleural effusions and slight atelectasis at the left lung base. IMPRESSION: 1. Feeding tube tip is in the body of the stomach. 2. Bilateral pleural effusions and slight atelectasis at the left lung base. Electronically Signed   By: Francene Boyers M.D.   On: 10/29/2019 11:12   DG Abd Portable 1V  Addendum Date: 10/22/2019   ADDENDUM REPORT: 10/22/2019 10:56 ADDENDUM: These results were called by telephone at the time of interpretation on 10/22/2019 at 10:55 am to provider [facility] , who verbally acknowledged these results. Possibility of a malpositioned nasogastric tube was discussed given the lateral position. A chest radiograph was suggested to assess position further and ensure that is not placed within the tracheobronchial tree. Also moderate amount of free air, volume visible on supine radiograph while nonspecific postoperatively may be more than expected, clinical correlation and further imaging as indicated was suggested. Electronically Signed   By: Donzetta Kohut M.D.   On: 10/22/2019 10:56   Result Date: 10/22/2019 CLINICAL DATA:  NG tube placement. EXAM: PORTABLE ABDOMEN -  1 VIEW COMPARISON:  Chest x-ray 10/20/2019 FINDINGS: Signs of free air along the right upper quadrant with Rigler's sign, outlining the colon in this location. An enteric tube is partially visualized passing into the left upper quadrant, slightly more lateral than would be expected based on previous imaging assessments of this area. Signs of valvular replacement in the chest partially visualized. IMPRESSION: 1. Moderate free air suspected in the right upper  quadrant. Patient is recent postop, suggest correlation with any changes in symptoms to determine whether further imaging with CT may be necessary as the amount of free air suggested may be more than expected. 2. Enteric tube is partially visualized into the left upper quadrant, slightly more lateral than would be expected on previous imaging., this may be in the proximal stomach. Suggest correlation with chest radiography prior to tube manipulation to ensure that it passes below the carina and is in fact in the esophagus, not the airway. 3. A call is out to the referring provider to further discuss findings in the above case. Electronically Signed: By: Donzetta Kohut M.D. On: 10/22/2019 10:43   DG Swallowing Func-Speech Pathology  Result Date: 10/31/2019 Objective Swallowing Evaluation: Type of Study: Bedside Swallow Evaluation  Patient Details Name: [name] [name] MRN: [id number] Date of Birth: [birth year] Today's Date: 10/31/2019 Time: SLP Start Time (ACUTE ONLY): 3704 -SLP Stop Time (ACUTE ONLY): 0935 SLP Time Calculation (min) (ACUTE ONLY): 30 min Past Medical History: Past Medical History: Diagnosis Date . Dyslipidemia  . Parkinson's disease (HCC)  . S/P mitral valve replacement   mitral regurg, endocarditis . Stroke [facility])  Past Surgical History: Past Surgical History: Procedure Laterality Date . BOWEL DECOMPRESSION N/A 10/19/2019  Procedure: BOWEL DECOMPRESSION;  Surgeon: Vida Rigger, MD;  Location: [facility] ENDOSCOPY;  Service: Endoscopy;  Laterality: N/A; . CATARACT EXTRACTION  2009, 2012  right 2009, left 2012 . COLECTOMY WITH COLOSTOMY CREATION/HARTMANN PROCEDURE N/A 10/20/2019  Procedure: sigmoid COLECTOMY WITH COLOSTOMY CREATION/;  Surgeon: Darnell Level, MD;  Location: [facility] ORS;  Service: General;  Laterality: N/A; . FLEXIBLE SIGMOIDOSCOPY N/A 10/19/2019  Procedure: Arnell Sieving;  Surgeon: Vida Rigger, MD;  Location: [facility] ENDOSCOPY;  Service: Endoscopy;  Laterality: N/A; . INTRAMEDULLARY (IM) NAIL  INTERTROCHANTERIC Left 09/14/2019  Procedure: INTRAMEDULLARY (IM) NAIL INTERTROCHANTRIC;  Surgeon: Roby Lofts, MD;  Location: [facility] OR;  Service: Orthopedics;  Laterality: Left; . LOOP RECORDER IMPLANT Left 03/13/2015  Procedure: LOOP RECORDER IMPLANT;  Surgeon: Marinus Maw, MD;  Location: [facility] CATH LAB;  Service: Cardiovascular;  Laterality: Left; . MITRAL VALVE ANNULOPLASTY  04/10/2007   76mm Edwards ring; r/t h/o MR and flail mitral leaflets due to endocarditis: MRI safe . TEE WITHOUT CARDIOVERSION N/A 03/13/2015  Procedure: TRANSESOPHAGEAL ECHOCARDIOGRAM (TEE);  Surgeon: Laurey Morale, MD;  Location: [facility] ENDOSCOPY;  Service: Cardiovascular;  Laterality: N/A; . TRANSTHORACIC ECHOCARDIOGRAM  08/15/2012  EF=>55%; normal LV systolic function; RV mildly dilated & systolic function mildly reduced; RA mod dilated; mild -mod MR & mildy increased gradients; mild TR; AV mildly sclerotic with mild-mod regurg HPI: 85 y.o. male with medical history significant of Parkinson's disease, mitral valve repair with history of endocarditis and mitral valve angioplasty in 2008, paroxysmal atrial fibrillation on Coumadin (previously on Eliquis but had inflammatory reaction), history of TIA, hyperlipidemia  was brought into the ED with complaint of abdominal pain.  History provided by wife who is at the bedside since patient is completely lethargic.  According to wife, he started having abdominal pain around 3 in the morning.  No further  information about abdominal pain.  According to her, his last bowel movement was sometime yesterday but small amount of BM he passed this morning.  No history of nausea, vomiting, fever, chills, chest pain, shortness of breath or any other complaint..  Pt is s/p surgical intervention sigmoid colectomy, descending colostomy. Dr. Gerrit Friends for Sigmoid Volvulus with infarction, perforation, and feculent peritonitis.  Pt has been seen by SLP for clinical swallow evaluation on 12/5/ coughing with thin liquids  prior to intake per notes.  Has have small bore feeding tube but pulled it out today.  SLP follow for po readiness.  Subjective: pt asleep in bed- will awake to verbal/tactile stimulation Assessment / Plan / Recommendation [facility] IP CLINICAL IMPRESSIONS 10/31/2019 Clinical Impression Pt presents with mild oropharyngeal dysphagia without aspiration of any consistency tested.  He does demonstrates decrreased initiation/coordination of swallow contributing to residuals.  After first few boluses of barium, pt's swallow became more efficient (due to his Parkinson's).  Severe pharyngeal retention noted intially with tsp nectar and puree, and various postures including head turn left and chin tuck decreased accumulation.  Pharyngeal retention was significantly abated after initiation of few swallows with head neutral and cued effortful swallow. In addition, liquid aided pharyngeal clearance of increased visocity boluses retained in vallecular region.  Due to pt's weakness/deconditioning, h/o coughing with liquids at home recommend to start a conservative diet of dys3/nectar.  Recommend pt consume thin water between meals after mouth care. Anticipate diet modifier will only need to be temporary and as pt medical improves, advancement appropriate. Using teach back, educated pt to findings/recomemndations/compensation.  Marland Kitchen SLP Visit Diagnosis Dysphagia, oropharyngeal phase (R13.12);Dysphagia, pharyngoesophageal phase (R13.14) Attention and concentration deficit following -- Frontal lobe and executive function deficit following -- Impact on safety and function Moderate aspiration risk;Risk for inadequate nutrition/hydration   [facility] IP TREATMENT RECOMMENDATION 10/31/2019 Treatment Recommendations Therapy as outlined in treatment plan below   Prognosis 10/27/2019 Prognosis for Safe Diet Advancement Fair Barriers to Reach Goals Cognitive deficits Barriers/Prognosis Comment -- [facility] IP DIET RECOMMENDATION 10/31/2019 SLP Diet Recommendations  Dysphagia 3 (Mech soft) solids;Nectar thick liquid Liquid Administration via Cup;Straw Medication Administration Whole meds with puree Compensations Slow rate;Small sips/bites;Follow solids with liquid Postural Changes Remain semi-upright after after feeds/meals (Comment)   [facility] IP OTHER RECOMMENDATIONS 10/31/2019 Recommended Consults -- Oral Care Recommendations Oral care BID Other Recommendations --   [facility] IP FOLLOW UP RECOMMENDATIONS 10/31/2019 Follow up Recommendations 24 hour supervision/assistance;Other (comment)   [facility] IP FREQUENCY AND DURATION 10/31/2019 Speech Therapy Frequency (ACUTE ONLY) min 3x week Treatment Duration 1 week      [facility] IP ORAL PHASE 10/31/2019 Oral Phase Impaired Oral - Pudding Teaspoon -- Oral - Pudding Cup -- Oral - Honey Teaspoon -- Oral - Honey Cup -- Oral - Nectar Teaspoon Weak lingual manipulation;Reduced posterior propulsion;Piecemeal swallowing;Lingual/palatal residue Oral - Nectar Cup Weak lingual manipulation;Reduced posterior propulsion;Lingual/palatal residue;Piecemeal swallowing Oral - Nectar Straw Weak lingual manipulation Oral - Thin Teaspoon Weak lingual manipulation;Reduced posterior propulsion Oral - Thin Cup Weak lingual manipulation;Reduced posterior propulsion Oral - Thin Straw Reduced posterior propulsion;Weak lingual manipulation Oral - Puree Weak lingual manipulation;Reduced posterior propulsion;Delayed oral transit Oral - Mech Soft Weak lingual manipulation;Reduced posterior propulsion;Impaired mastication;Lingual/palatal residue Oral - Regular -- Oral - Multi-Consistency -- Oral - Pill -- Oral Phase - Comment pt required liquid to orally transit tablet after did not transit with puree  [facility] IP PHARYNGEAL PHASE 10/31/2019 Pharyngeal Phase Impaired Pharyngeal- Pudding Teaspoon -- Pharyngeal -- Pharyngeal- Pudding Cup -- Pharyngeal --  Pharyngeal- Honey Teaspoon -- Pharyngeal -- Pharyngeal- Honey Cup -- Pharyngeal -- Pharyngeal- Nectar Teaspoon Reduced tongue base  retraction;Pharyngeal residue - valleculae Pharyngeal Material does not enter airway Pharyngeal- Nectar Cup Reduced tongue base retraction;Pharyngeal residue - valleculae Pharyngeal Material does not enter airway Pharyngeal- Nectar Straw WFL Pharyngeal Material does not enter airway Pharyngeal- Thin Teaspoon WFL Pharyngeal Material does not enter airway Pharyngeal- Thin Cup [facility] Pharyngeal Material does not enter airway Pharyngeal- Thin Straw WFL Pharyngeal Material does not enter airway Pharyngeal- Puree Pharyngeal residue - valleculae;Reduced tongue base retraction Pharyngeal Material does not enter airway Pharyngeal- Mechanical Soft Pharyngeal residue - valleculae;Reduced tongue base retraction Pharyngeal Material does not enter airway Pharyngeal- Regular -- Pharyngeal -- Pharyngeal- Multi-consistency -- Pharyngeal -- Pharyngeal- Pill WFL Pharyngeal Material does not enter airway Pharyngeal Comment various postures including head turn left and chin tuck decreased accumulation however after "warm up" swallows, pharyngeal retention was significantly decreased with head neutral and cued effortful swallow  [facility] IP CERVICAL ESOPHAGEAL PHASE 10/31/2019 Cervical Esophageal Phase Impaired Pudding Teaspoon -- Pudding Cup -- Honey Teaspoon -- Honey Cup -- Nectar Teaspoon -- Nectar Cup -- Nectar Straw -- Thin Teaspoon -- Thin Cup -- Thin Straw -- Puree -- Mechanical Soft -- Regular -- Multi-consistency -- Pill -- Cervical Esophageal Comment no residuals noted at pyriform sinus or cricopharyngeal area however minimal UES impaired relaxation noted x1 during study with dry swallow Donavan Burnet, MS [facility] SLP Acute Rehab Services Pager [phone] Office [phone] [name] 10/31/2019, 10:10 AM              Vas [place] UE DVT  Result Date: 10/22/2019 UPPER VENOUS STUDY  Indications: Swelling Risk Factors: None identified. Limitations: Poor ultrasound/tissue interface and patient immobility. Comparison Study: No prior  studies. Performing Technologist: Chanda Busing RVT  Examination Guidelines: A complete evaluation includes B-mode imaging, spectral Doppler, color Doppler, and power Doppler as needed of all accessible portions of each vessel. Bilateral testing is considered an integral part of a complete examination. Limited examinations for reoccurring indications may be performed as noted.  Right Findings: +----------+------------+---------+-----------+----------+-------+ RIGHT     CompressiblePhasicitySpontaneousPropertiesSummary +----------+------------+---------+-----------+----------+-------+ IJV           Full       Yes       Yes                      +----------+------------+---------+-----------+----------+-------+ Subclavian    Full       Yes       Yes                      +----------+------------+---------+-----------+----------+-------+ Axillary      Full       Yes       Yes                      +----------+------------+---------+-----------+----------+-------+ Brachial      Full       Yes       Yes                      +----------+------------+---------+-----------+----------+-------+ Radial        Full                                          +----------+------------+---------+-----------+----------+-------+ Ulnar  Full                                          +----------+------------+---------+-----------+----------+-------+ Cephalic      Full                                          +----------+------------+---------+-----------+----------+-------+ Basilic       Full                                          +----------+------------+---------+-----------+----------+-------+  Left Findings: +----------+------------+---------+-----------+----------+-------+ LEFT      CompressiblePhasicitySpontaneousPropertiesSummary +----------+------------+---------+-----------+----------+-------+ Subclavian    Full       Yes       Yes                       +----------+------------+---------+-----------+----------+-------+  Summary:  Right: No evidence of deep vein thrombosis in the upper extremity. No evidence of superficial vein thrombosis in the upper extremity.  Left: No evidence of thrombosis in the subclavian.  *See table(s) above for measurements and observations.  Diagnosing physician: Ruta Hinds MD Electronically signed by Ruta Hinds MD on 10/22/2019 at 3:45:29 PM.    Final      Subjective: Patient in NAD, continues to have low blood sugars due to poor po intake.   Discharge Exam: Vitals:   11/04/19 0800 11/04/19 1138  BP:  (!) 148/55  Pulse:  79  Resp:  17  Temp: 97.8 F (36.6 C)   SpO2:  100%   Vitals:   11/04/19 0438 11/04/19 0500 11/04/19 0800 11/04/19 1138  BP:    (!) 148/55  Pulse: 84   79  Resp: (!) 25   17  Temp:   97.8 F (36.6 C)   TempSrc:      SpO2: 95%   100%  Weight:  74.9 kg    Height:        General: not in acute distress Psych: Awake, oriented x name/place Chest: Normal RR, no cough Cardiac: irregularly irregular rate Abdomen: Soft nontender nondistended surgical incision covered by clean dry dressing, left lower quadrant with ostomy and notable liquid stool in bag-stoma pink and nonedematous. GU: Foley catheter with clear yellow colored urine Extremities: Symmetrical in appearance without cyanosis, clubbing or effusion, tremors of bilateral arms.    The results of significant diagnostics from this hospitalization (including imaging, microbiology, ancillary and laboratory) are listed below for reference.     Microbiology: No results found for this or any previous visit (from the past 240 hour(s)).   Labs: BNP (last 3 results) Recent Labs    08/24/19 1206  BNP 371.6*   Basic Metabolic Panel: Recent Labs  Lab 10/29/19 0430 10/30/19 0420 10/31/19 0158 11/01/19 0220 11/02/19 0213  NA 141 140 138 140 137  K 3.7 3.2* 3.7 3.2* 3.5  CL 108 107 106 109 107  CO2 25 25 24 24 22    GLUCOSE 98 130* 119* 88 78  BUN 25* 23 25* 21 19  CREATININE 0.83 0.76 0.73 0.64 0.67  CALCIUM 8.0* 7.7* 8.1* 8.0* 8.0*  MG  --  1.8  --   --   --   PHOS  --  2.6  --   --   --    Liver Function Tests: Recent Labs  Lab 10/29/19 0430  AST 35  ALT 7  ALKPHOS 120  BILITOT 2.0*  PROT 5.0*  ALBUMIN 2.3*   No results for input(s): LIPASE, AMYLASE in the last 168 hours. No results for input(s): AMMONIA in the last 168 hours. CBC: Recent Labs  Lab 10/29/19 0430 10/30/19 0420 10/31/19 0158 11/01/19 0220 11/02/19 0213  WBC 9.9 9.9 9.7 7.5 6.7  NEUTROABS 8.4*  --   --   --   --   HGB 11.4* 11.5* 11.7* 11.0* 10.6*  HCT 35.9* 35.7* 36.6* 34.3* 34.0*  MCV 99.7 99.2 100.5* 99.7 102.4*  PLT 183 211 227 223 253   Cardiac Enzymes: No results for input(s): CKTOTAL, CKMB, CKMBINDEX, TROPONINI in the last 168 hours. BNP: Invalid input(s): POCBNP CBG: Recent Labs  Lab 11/04/19 0424 11/04/19 0558 11/04/19 0744 11/04/19 0818 11/04/19 1139  GLUCAP 75 79 49* 81 55*   D-Dimer No results for input(s): DDIMER in the last 72 hours. Hgb A1c No results for input(s): HGBA1C in the last 72 hours. Lipid Profile No results for input(s): CHOL, HDL, LDLCALC, TRIG, CHOLHDL, LDLDIRECT in the last 72 hours. Thyroid function studies No results for input(s): TSH, T4TOTAL, T3FREE, THYROIDAB in the last 72 hours.  Invalid input(s): FREET3 Anemia work up No results for input(s): VITAMINB12, FOLATE, FERRITIN, TIBC, IRON, RETICCTPCT in the last 72 hours. Urinalysis    Component Value Date/Time   COLORURINE AMBER (A) 10/19/2019 1519   APPEARANCEUR CLEAR 10/19/2019 1519   LABSPEC 1.040 (H) 10/19/2019 1519   PHURINE 5.0 10/19/2019 1519   GLUCOSEU NEGATIVE 10/19/2019 1519   HGBUR NEGATIVE 10/19/2019 1519   BILIRUBINUR NEGATIVE 10/19/2019 1519   KETONESUR 20 (A) 10/19/2019 1519   PROTEINUR 30 (A) 10/19/2019 1519   NITRITE NEGATIVE 10/19/2019 1519   LEUKOCYTESUR NEGATIVE 10/19/2019 1519    Sepsis Labs Invalid input(s): PROCALCITONIN,  WBC,  LACTICIDVEN Microbiology No results found for this or any previous visit (from the past 240 hour(s)).   Time coordinating discharge: Over 33 minutes  SIGNED:   Ky Barban, MD  [facility] 11/04/2019, 11:58 AM   If 7PM-7AM, please contact night-coverage [email or web address] Password [email or web address]

## 2019-11-04 NOTE — Progress Notes (Signed)
Upon assessment, RN found blood filled blister on R heel and open blister on L heel. RN attempted to measure, unable do to patient disorientation and constant shaking. RN placed foam protectors on both heels and will continue to monitor.

## 2019-11-23 DEATH — deceased

## 2019-11-30 ENCOUNTER — Ambulatory Visit: Payer: Medicare Other | Admitting: Internal Medicine

## 2020-04-19 IMAGING — CT CT ABD-PELV W/ CM
2 of 5 series · 15 of 46 positions shown, 17 images · IV contrast (omnipaque)
Comparison: None

CLINICAL DATA: Abdominal pain that started around 6 o'clock today.

EXAM:
CT ABDOMEN AND PELVIS WITH CONTRAST
TECHNIQUE: Multidetector CT imaging of the abdomen and pelvis was performed
using the standard protocol following bolus administration of
intravenous contrast.
CONTRAST:  100mL OMNIPAQUE IOHEXOL 300 MG/ML  SOLN

[Series 3: axial st · axial · 0.75mm/px · z∈[+1058,+1484]mm · 12 of 99 slices shown, 14 images]
[im 7/99  soft-tissue]
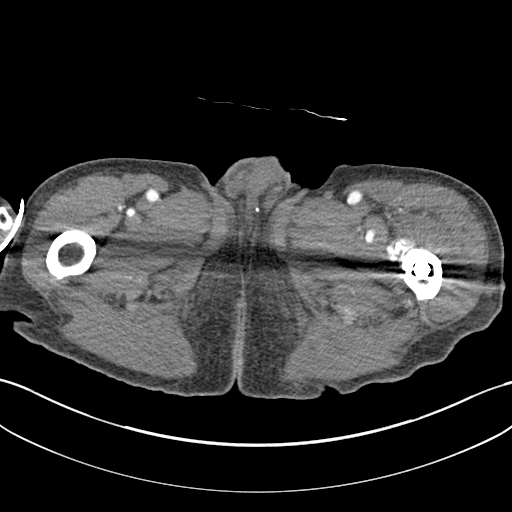
[im 7/99  bone]
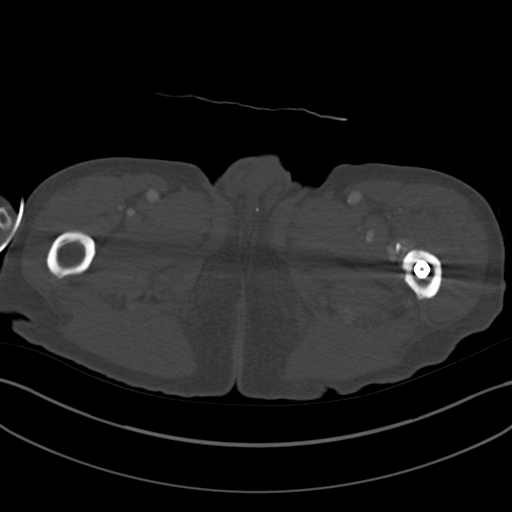
[im 14/99  soft-tissue]
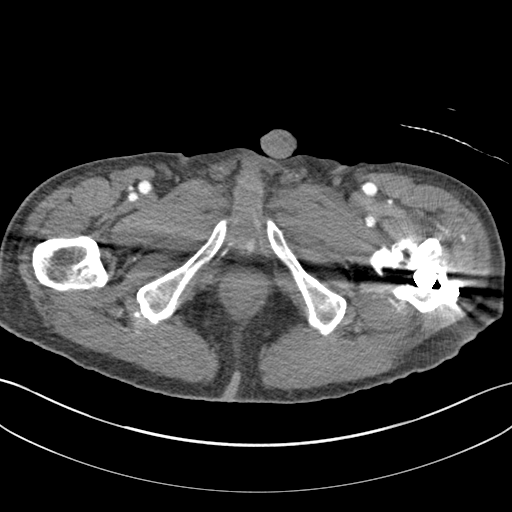
[im 20/99  soft-tissue]
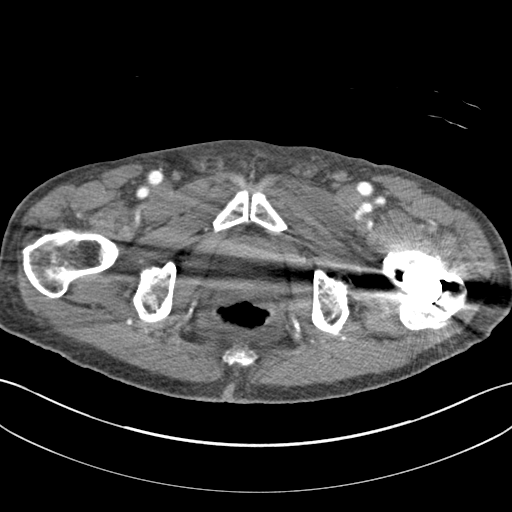
[im 33/99  soft-tissue]
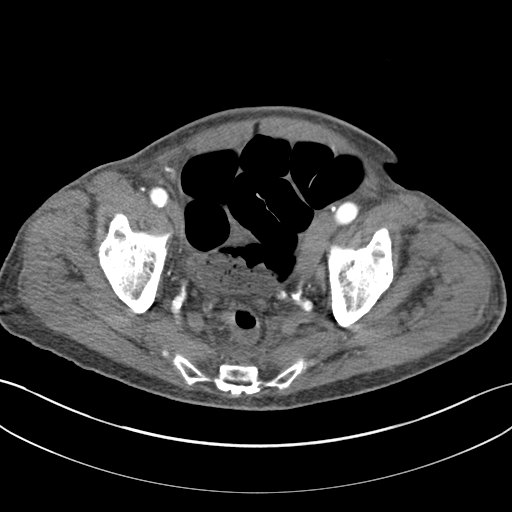
[im 40/99  soft-tissue]
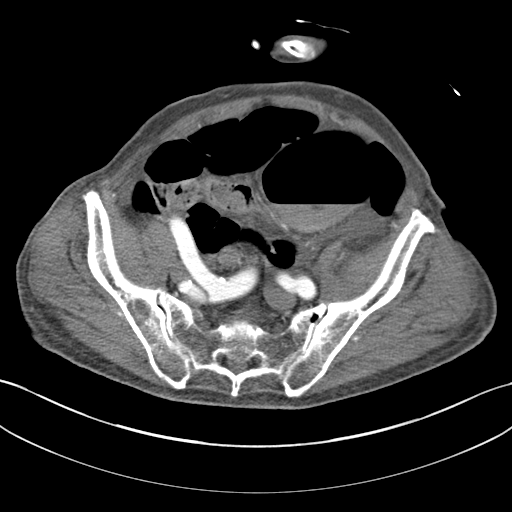
[im 46/99  soft-tissue]
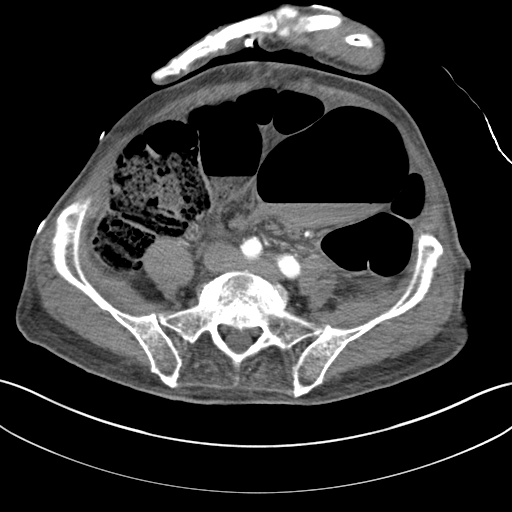
[im 53/99  soft-tissue]
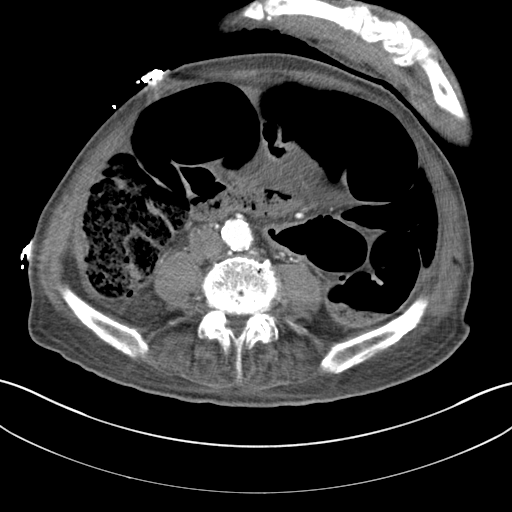
[im 59/99  soft-tissue]
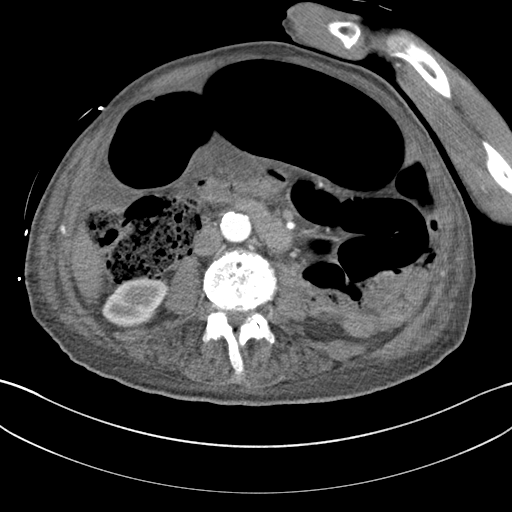
[im 66/99  soft-tissue]
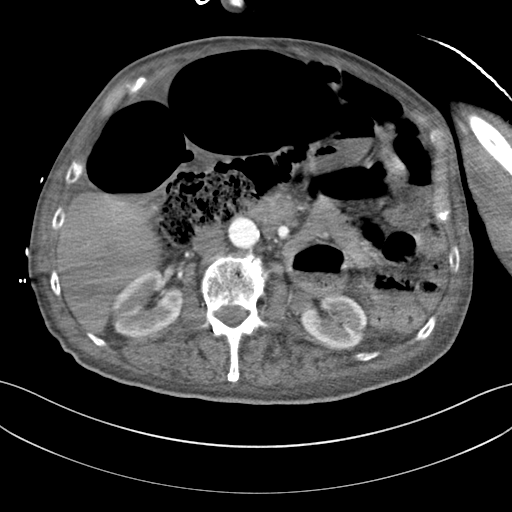
[im 66/99  bone]
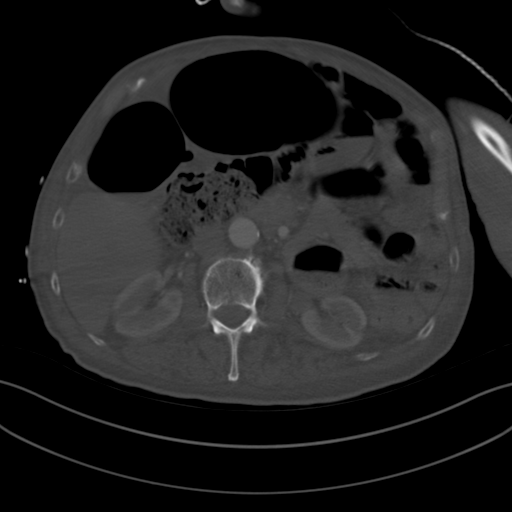
[im 79/99  soft-tissue]
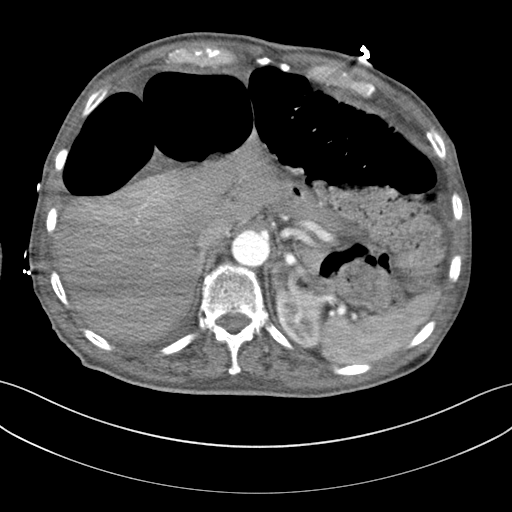
[im 85/99  soft-tissue]
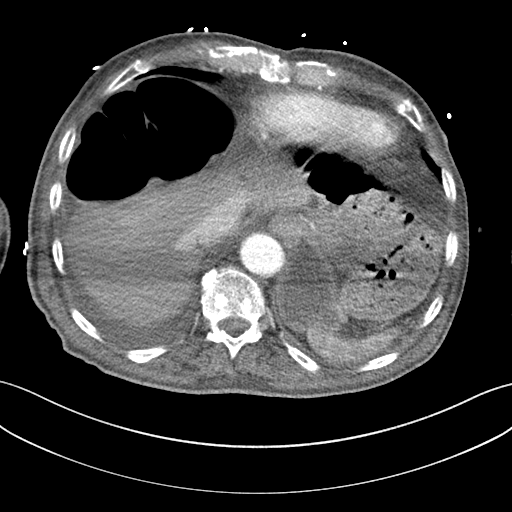
[im 92/99  soft-tissue]
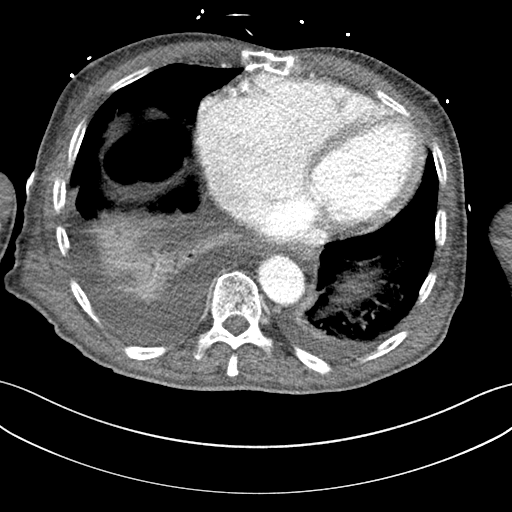

[Series 5: coronal st · coronal · 0.72mm/px · 3 of 149 slices shown]
[im 50/149  soft-tissue]
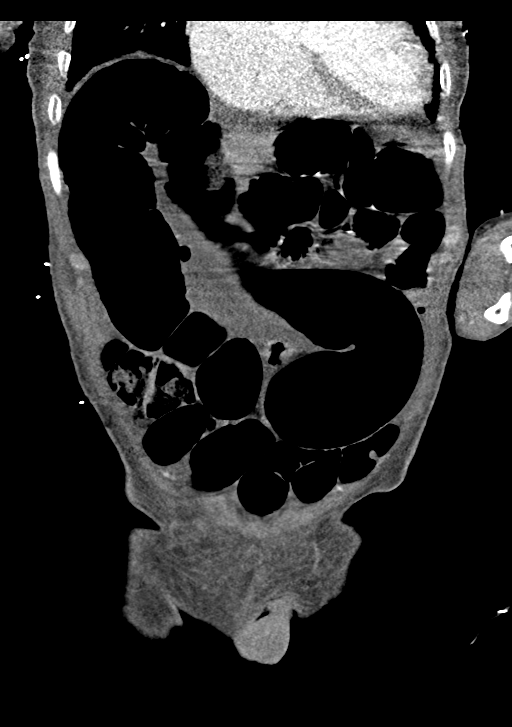
[im 66/149  soft-tissue]
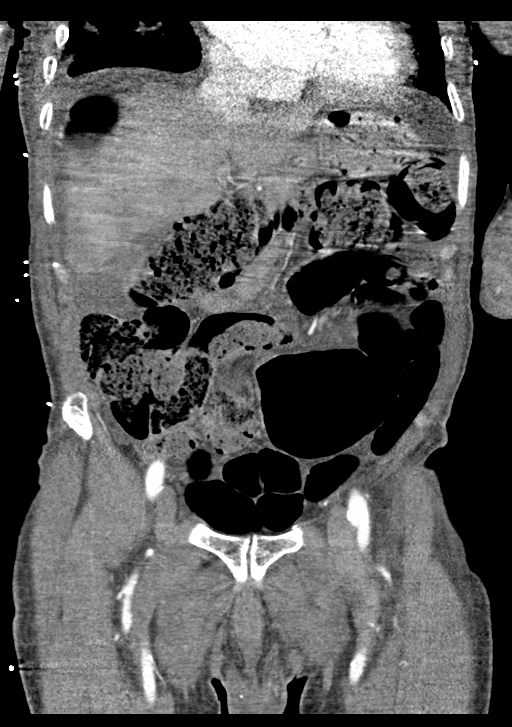
[im 83/149  soft-tissue]
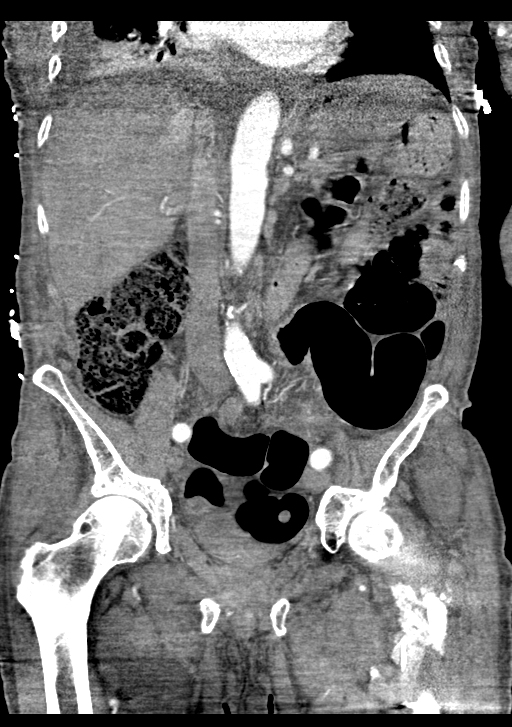

[15 of 46 positions shown; findings below may reference images not displayed]

FINDINGS: Lower chest: Signs of mitral valve replacement. Cardiac enlargement.
Heart is incompletely imaged. Basilar airspace disease with moderate
right and small left pleural effusion.

Hepatobiliary: Limited assessment due to arm position, technical
factors and edema, no gross abnormality.

Pancreas: Also with some limited assessment on today's study.
Grossly normal.

Spleen: Normal in size without focal abnormality.

Adrenals/Urinary Tract: Normal adrenal glands. Marked renal cortical
scarring right worse than left. No signs of hydronephrosis. Small
left renal cyst.

Stomach/Bowel: Bowel is difficult to follow throughout the abdomen.
The colon is markedly distended, particularly the sigmoid colon.
There is a twist in the sigmoid colon with configuration that is
compatible with sigmoid volvulus. Bowel edema is present. No signs
of pneumatosis at the current time. Small volume ascites. Other
bowel loops are difficult to follow as described.

Vascular/Lymphatic: Patent abdominal vasculature. No signs of
adenopathy in the abdomen or pelvis.

Reproductive: Limited assessment of the prostate and pelvic
structures secondary to streak artifact from previous ORIF of a left
proximal femur fracture.

Other: Bowel edema. No current signs of free air. Small volume
ascites.

Musculoskeletal: No signs of acute or destructive bone process.
Postoperative changes related to intertrochanteric fracture ORIF
with femoral nailing and hip screw placement partially visualized.
Osteopenia.
IMPRESSION: 1. Signs of sigmoid volvulus with marked distension of the sigmoid
colon. Small volume ascites, no free air or pneumatosis. Surgical
and or GI consultation is suggested for consideration of reduction.
2. Basilar airspace disease with moderate effusion, raise the
question of pneumonia or aspiration at the right lung base. Small
left effusion as well.
3. Cardiac enlargement.
4. Marked renal cortical scarring right worse than left.
5. Osteopenia.
6. Postoperative changes related to previous ORIF of a left proximal
femur fracture.
7. These results were called by telephone at the time of
interpretation on 10/19/2019 at [DATE] to provider MYDARCHIK CHULUMBAYEV ,
who verbally acknowledged these results.
